# Patient Record
Sex: Female | Born: 1955 | Race: Black or African American | Hispanic: No | Marital: Married
Health system: Midwestern US, Community
[De-identification: ages and names within clinical notes are randomized; demographics above are authoritative.]

## PROBLEM LIST (undated history)

## (undated) DIAGNOSIS — R200 Anesthesia of skin: Secondary | ICD-10-CM

## (undated) DIAGNOSIS — E049 Nontoxic goiter, unspecified: Secondary | ICD-10-CM

## (undated) DIAGNOSIS — I739 Peripheral vascular disease, unspecified: Secondary | ICD-10-CM

## (undated) DIAGNOSIS — J189 Pneumonia, unspecified organism: Secondary | ICD-10-CM

## (undated) DIAGNOSIS — N2581 Secondary hyperparathyroidism of renal origin: Secondary | ICD-10-CM

## (undated) DIAGNOSIS — D510 Vitamin B12 deficiency anemia due to intrinsic factor deficiency: Secondary | ICD-10-CM

## (undated) DIAGNOSIS — K5900 Constipation, unspecified: Secondary | ICD-10-CM

## (undated) DIAGNOSIS — E039 Hypothyroidism, unspecified: Secondary | ICD-10-CM

## (undated) DIAGNOSIS — Z5189 Encounter for other specified aftercare: Secondary | ICD-10-CM

## (undated) DIAGNOSIS — J45909 Unspecified asthma, uncomplicated: Secondary | ICD-10-CM

## (undated) DIAGNOSIS — T4145XA Adverse effect of unspecified anesthetic, initial encounter: Secondary | ICD-10-CM

## (undated) DIAGNOSIS — I509 Heart failure, unspecified: Secondary | ICD-10-CM

## (undated) DIAGNOSIS — E1142 Type 2 diabetes mellitus with diabetic polyneuropathy: Secondary | ICD-10-CM

## (undated) DIAGNOSIS — R002 Palpitations: Secondary | ICD-10-CM

## (undated) DIAGNOSIS — E785 Hyperlipidemia, unspecified: Secondary | ICD-10-CM

## (undated) DIAGNOSIS — Z923 Personal history of irradiation: Secondary | ICD-10-CM

## (undated) DIAGNOSIS — E119 Type 2 diabetes mellitus without complications: Secondary | ICD-10-CM

## (undated) DIAGNOSIS — E042 Nontoxic multinodular goiter: Secondary | ICD-10-CM

## (undated) DIAGNOSIS — Z9221 Personal history of antineoplastic chemotherapy: Secondary | ICD-10-CM

## (undated) DIAGNOSIS — D649 Anemia, unspecified: Secondary | ICD-10-CM

## (undated) DIAGNOSIS — R519 Headache, unspecified: Secondary | ICD-10-CM

## (undated) DIAGNOSIS — K219 Gastro-esophageal reflux disease without esophagitis: Secondary | ICD-10-CM

## (undated) DIAGNOSIS — I10 Essential (primary) hypertension: Secondary | ICD-10-CM

## (undated) DIAGNOSIS — M1990 Unspecified osteoarthritis, unspecified site: Secondary | ICD-10-CM

## (undated) DIAGNOSIS — G709 Myoneural disorder, unspecified: Secondary | ICD-10-CM

## (undated) DIAGNOSIS — M1611 Unilateral primary osteoarthritis, right hip: Secondary | ICD-10-CM

## (undated) DIAGNOSIS — J40 Bronchitis, not specified as acute or chronic: Secondary | ICD-10-CM

## (undated) DIAGNOSIS — D509 Iron deficiency anemia, unspecified: Secondary | ICD-10-CM

## (undated) DIAGNOSIS — C50919 Malignant neoplasm of unspecified site of unspecified female breast: Secondary | ICD-10-CM

## (undated) DIAGNOSIS — N189 Chronic kidney disease, unspecified: Secondary | ICD-10-CM

## (undated) DIAGNOSIS — R011 Cardiac murmur, unspecified: Secondary | ICD-10-CM

## (undated) DIAGNOSIS — E079 Disorder of thyroid, unspecified: Secondary | ICD-10-CM

## (undated) DIAGNOSIS — R3915 Urgency of urination: Secondary | ICD-10-CM

## (undated) DIAGNOSIS — E041 Nontoxic single thyroid nodule: Secondary | ICD-10-CM

## (undated) DIAGNOSIS — G4730 Sleep apnea, unspecified: Secondary | ICD-10-CM

## (undated) DIAGNOSIS — R0600 Dyspnea, unspecified: Secondary | ICD-10-CM

## (undated) HISTORY — DX: Palpitations: R00.2

## (undated) HISTORY — DX: Unilateral primary osteoarthritis, right hip: M16.11

## (undated) HISTORY — DX: Unspecified asthma, uncomplicated: J45.909

## (undated) HISTORY — PX: MASTECTOMY: SHX3

## (undated) HISTORY — DX: Encounter for other specified aftercare: Z51.89

## (undated) HISTORY — DX: Hypothyroidism, unspecified: E03.9

## (undated) HISTORY — DX: Malignant neoplasm of unspecified site of unspecified female breast: C50.919

## (undated) HISTORY — DX: Disorder of thyroid, unspecified: E07.9

## (undated) HISTORY — DX: Bronchitis, not specified as acute or chronic: J40

## (undated) HISTORY — DX: Vitamin B12 deficiency anemia due to intrinsic factor deficiency: D51.0

## (undated) HISTORY — PX: DIAGNOSTIC LAPAROSCOPY: SUR761

## (undated) HISTORY — PX: KNEE ARTHROSCOPY: SUR90

## (undated) HISTORY — DX: Nontoxic single thyroid nodule: E04.1

## (undated) HISTORY — DX: Urgency of urination: R39.15

## (undated) HISTORY — DX: Chronic kidney disease, unspecified: N18.9

## (undated) HISTORY — DX: Pneumonia, unspecified organism: J18.9

## (undated) HISTORY — DX: Adverse effect of unspecified anesthetic, initial encounter: T41.45XA

## (undated) HISTORY — DX: Constipation, unspecified: K59.00

## (undated) HISTORY — DX: Type 2 diabetes mellitus without complications: E11.9

## (undated) HISTORY — PX: ABDOMINAL SURGERY: SHX537

## (undated) HISTORY — DX: Hyperlipidemia, unspecified: E78.5

---

## 1980-11-24 HISTORY — PX: CHOLECYSTECTOMY: SHX55

## 1989-11-24 DIAGNOSIS — I10 Essential (primary) hypertension: Secondary | ICD-10-CM

## 1989-11-24 HISTORY — DX: Essential (primary) hypertension: I10

## 2006-11-24 HISTORY — PX: CARPAL TUNNEL RELEASE: SHX101

## 2008-11-24 DIAGNOSIS — E119 Type 2 diabetes mellitus without complications: Secondary | ICD-10-CM

## 2008-11-24 HISTORY — DX: Type 2 diabetes mellitus without complications: E11.9

## 2009-11-24 HISTORY — PX: ABDOMINAL HYSTERECTOMY: SHX81

## 2010-01-08 DIAGNOSIS — I10 Essential (primary) hypertension: Secondary | ICD-10-CM | POA: Insufficient documentation

## 2011-04-29 ENCOUNTER — Encounter

## 2012-08-31 ENCOUNTER — Encounter

## 2012-10-10 LAB — EKG, 12 LEAD, INITIAL
Atrial Rate: 65 {beats}/min
Calculated P Axis: 63 degrees
Calculated R Axis: 38 degrees
Calculated T Axis: 11 degrees
Diagnosis: NORMAL
P-R Interval: 180 ms
Q-T Interval: 414 ms
QRS Duration: 82 ms
QTC Calculation (Bezet): 430 ms
Ventricular Rate: 65 {beats}/min

## 2012-10-10 MED ADMIN — azithromycin (ZITHROMAX) tablet 500 mg: ORAL | @ 10:00:00 | NDC 68084027811

## 2012-10-10 MED FILL — AZITHROMYCIN 250 MG TAB: 250 mg | ORAL | Qty: 2

## 2012-10-10 NOTE — ED Provider Notes (Signed)
HPI Comments: 3:50 AM   56 y.o. female presents to the ED C/O productive cough of clear sputum x1 week. Pt woke up about 2 hours ago, in the middle of the night, and it scared her because she felt she couldn't breath because of her cough. She also confirms chest heaviness tonight. Pt states she thinks she has bronchitis, she went to [facility] 1 week ago and was prescribed Tessalon Pearls and Guaifenesin. Pt denies fever, nausea, vomiting, swelling in legs, and any other Sx or complaints.  Her medical hx includes DM and HTN. She denies hx of HTN or CVA.     Patient is a 56 y.o. female presenting with cough. The history is provided by the patient. No language interpreter was used.   Cough  This is a new problem. The current episode started more than 1 week ago. The problem occurs constantly. The problem has been gradually worsening. The cough is non-productive (clear sputum). There has been no fever. Pertinent negatives include no chills, no ear pain, no headaches, no rhinorrhea, no sore throat, no myalgias, no wheezing, no nausea and no vomiting. She is not a smoker.   Recorded by Susette Racer, ED Scribe, as dictated by Hetty Blend, MD.     Past Medical History   Diagnosis Date   ??? Diabetes    ??? Hypertension         Past Surgical History   Procedure Laterality Date   ??? Hx hysterectomy     ??? Pr abdomen surgery proc unlisted     ??? Hx cholecystectomy           History reviewed. No pertinent family history.     History     Social History   ??? Marital Status: MARRIED     Spouse Name: N/A     Number of Children: N/A   ??? Years of Education: N/A     Occupational History   ??? Not on file.     Social History Main Topics   ??? Smoking status: Never Smoker    ??? Smokeless tobacco: Not on file   ??? Alcohol Use: No   ??? Drug Use: Not on file   ??? Sexually Active: Not on file     Other Topics Concern   ??? Not on file     Social History Narrative   ??? No narrative on file            ALLERGIES: Entex      Review of Systems    Constitutional: Negative for fever and chills.   HENT: Negative for ear pain, sore throat and rhinorrhea.    Respiratory: Positive for cough and chest tightness ("chest heaviness"). Negative for wheezing.    Cardiovascular: Negative for leg swelling.   Gastrointestinal: Negative for nausea and vomiting.   Musculoskeletal: Negative for myalgias.   Neurological: Negative for dizziness and headaches.   All other systems reviewed and are negative.    Recorded by Susette Racer, ED Scribe, as dictated by Hetty Blend, MD.     Filed Vitals:    10/10/12 0336   BP: 167/82   Pulse: 68   Temp: 98.5 ??F (36.9 ??C)   Resp: 18   Height: 5\' 2"  (1.575 m)   Weight: 103.42 kg (228 lb)   SpO2: 97%            Physical Exam     Vital signs and nursing notes reviewed    CONSTITUTIONAL: Alert, middle aged, in no  apparent distress; well-developed; well-nourished. +Overweight.  HEAD:  Normocephalic, atraumatic  EYES: PERRL; EOM's intact.  ENTM: Nose: no rhinorrhea; Throat: no erythema or exudate, mucous membranes moist  Neck:  No JVD, supple without lymphadenopathy  RESP: Equal breath sounds. +Scatter Rhonchi.   CV: S1 and S2 WNL; No murmurs, gallops or rubs.  GI: Normal bowel sounds, abdomen soft and non-tender. No masses or organomegaly.  GU: No costo-vertebral angle tenderness.  BACK:  Non-tender  UPPER EXT:  Normal inspection  LOWER EXT: No edema, no calf tenderness.  Distal pulses intact.  NEURO: CN intact, reflexes 2/4 and sym, strength 5/5 and sym, sensation intact.  SKIN: No rashes; Normal for age and stage.  PSYCH:  Alert and oriented, normal affect.     MDM     Differential Diagnosis; Clinical Impression; Plan:     DDx: bronchitis vs PNA   Amount and/or Complexity of Data Reviewed:   Clinical lab tests:  Reviewed and ordered  Tests in the radiology section of CPT??:  Reviewed and ordered (CXR)  Tests in the medicine section of the CPT??:  Ordered and reviewed (EKG)  Discussion of test results with the performing providers:  No    Decide to obtain previous medical records or to obtain history from someone other than the patient:  No   Obtain history from someone other than the patient:  No   Review and summarize past medical records:  Yes   Discuss the patient with another provider:  No   Independant visualization of image, tracing, or specimen:  Yes (CXR, EKG)      Procedures       RESULTS    EKG READING:   EKG read by Hetty Blend, MD at 3:58AM  Vent rate 65 bpm, normal sinus rhythm, normal EKG  Recorded by Susette Racer, ED Scribe, as dictated by Hetty Blend, MD.     RADIOLOGY RESULT:  A Chest X-Ray was ordered. My reading of this film is perihilar infiltrate. (Pending review by Radiologist.)   Recorded by Susette Racer, ED Scribe, as dictated by Hetty Blend, MD.       EKG, 12 LEAD, INITIAL    (Results Pending)   XR CHEST PA LAT    (Results Pending)       Labs Reviewed - No data to display    No results found for this or any previous visit (from the past 12 hour(s)).      4:36 AM  Stana Bunting Munguia's  results have been reviewed with her.  She has been counseled regarding her diagnosis, treatment, and plan.  She verbally conveys understanding and agreement of the signs, symptoms, diagnosis, treatment and prognosis and additionally agrees to follow up as discussed.  She also agrees with the care-plan and conveys that all of her questions have been answered.  I have also provided discharge instructions for her that include: educational information regarding their diagnosis and treatment, and list of reasons why they would want to return to the ED prior to their follow-up appointment, should her condition change.     CLINICAL IMPRESSION    1. Early PNA (pneumonia)        Recorded by Susette Racer, ED Scribe, as dictated by Hetty Blend, MD.

## 2012-10-10 NOTE — ED Notes (Signed)
Patient states " I have bronchitis".

## 2012-10-10 NOTE — ED Notes (Signed)
I have reviewed discharge instructions with the patient.  The patient verbalized understanding.Patient armband removed and shredded

## 2012-10-18 ENCOUNTER — Encounter

## 2012-10-19 LAB — D-DIMER, QUANTITATIVE: D-Dimer, Quant: 0.35 ug/ml(FEU) (ref <0.46)

## 2012-10-19 LAB — METABOLIC PANEL, COMPREHENSIVE
A-G Ratio: 0.9 (ref 0.8–1.7)
ALT (SGPT): 30 U/L (ref 12.0–78.0)
AST (SGOT): 17 U/L (ref 15–37)
Albumin: 3.4 g/dL (ref 3.4–5.0)
Alk. phosphatase: 126 U/L (ref 50–136)
Anion gap: 6 mmol/L (ref 3.0–18)
BUN/Creatinine ratio: 13 (ref 12–20)
BUN: 12 MG/DL (ref 7.0–18)
Bilirubin, total: 0.4 MG/DL (ref 0.2–1.0)
CO2: 34 MMOL/L — ABNORMAL HIGH (ref 21–32)
Calcium: 9.1 MG/DL (ref 8.5–10.1)
Chloride: 104 MMOL/L (ref 100–108)
Creatinine: 0.93 MG/DL (ref 0.6–1.3)
GFR est AA: >60 mL/min/{1.73_m2} (ref >60)
GFR est non-AA: >60 mL/min/{1.73_m2} (ref >60)
Globulin: 4 g/dL (ref 2.0–4.0)
Glucose: 104 MG/DL — ABNORMAL HIGH (ref 74–99)
Potassium: 3.7 MMOL/L (ref 3.5–5.5)
Protein, total: 7.4 g/dL (ref 6.4–8.2)
Sodium: 144 MMOL/L (ref 136–145)

## 2012-10-19 LAB — CARDIAC PANEL,(CK, CKMB & TROPONIN)
CK - MB: 1.7 ng/ml (ref 0.5–3.6)
CK-MB Index: 1 % (ref 0.0–4.0)
CK: 162 U/L (ref 26–192)
Troponin-I, QT: <0.02 NG/ML (ref 0.00–0.06)

## 2012-10-19 LAB — EKG, 12 LEAD, INITIAL
Atrial Rate: 65 {beats}/min
Calculated P Axis: 32 degrees
Calculated R Axis: 16 degrees
Calculated T Axis: -3 degrees
Diagnosis: NORMAL
P-R Interval: 174 ms
Q-T Interval: 406 ms
QRS Duration: 84 ms
QTC Calculation (Bezet): 422 ms
Ventricular Rate: 65 {beats}/min

## 2012-10-19 LAB — CBC WITH AUTOMATED DIFF
ABS. BASOPHILS: 0.1 10*3/uL — ABNORMAL HIGH (ref 0.0–0.06)
ABS. EOSINOPHILS: 0.1 10*3/uL (ref 0.0–0.4)
ABS. LYMPHOCYTES: 2.7 10*3/uL (ref 0.9–3.6)
ABS. MONOCYTES: 0.6 10*3/uL (ref 0.05–1.2)
ABS. NEUTROPHILS: 2.7 10*3/uL (ref 1.8–8.0)
BASOPHILS: 1 % (ref 0–2)
EOSINOPHILS: 2 % (ref 0–5)
HCT: 39 % (ref 35.0–45.0)
HGB: 12 g/dL (ref 12.0–16.0)
LYMPHOCYTES: 44 % (ref 21–52)
MCH: 24.4 PG (ref 24.0–34.0)
MCHC: 30.8 g/dL — ABNORMAL LOW (ref 31.0–37.0)
MCV: 79.4 FL (ref 74.0–97.0)
MONOCYTES: 9 % (ref 3–10)
MPV: 12.2 FL — ABNORMAL HIGH (ref 9.2–11.8)
NEUTROPHILS: 44 % (ref 40–73)
PLATELET: 201 10*3/uL (ref 135–420)
RBC: 4.91 M/uL (ref 4.20–5.30)
RDW: 16.6 % — ABNORMAL HIGH (ref 11.6–14.5)
WBC: 6.1 10*3/uL (ref 4.6–13.2)

## 2012-10-19 LAB — D DIMER: D DIMER: 0.35 ug/ml(FEU) (ref <0.46)

## 2012-10-19 MED ADMIN — ioversol (OPTIRAY) 350 mg iodine/mL contrast injection 125 mL: INTRAVENOUS | @ 12:00:00 | NDC 00019133381

## 2012-10-19 MED FILL — OPTIRAY 350 MG IODINE/ML INTRAVENOUS SYRINGE: 350 mg iodine/mL | INTRAVENOUS | Qty: 125

## 2012-10-19 NOTE — ED Notes (Signed)
Per patient seen in [facility] ED 10/10/2012 and DC'd with DX of PNA, continues to have cough, states that she has chest congestion and feel like the mucus is making it difficult to breath and is gagging her.

## 2012-10-19 NOTE — ED Notes (Signed)
CT operator called the ER to advise is should not be much longer for Bed 10 to come down for the CT as he is finishing up some work.

## 2012-10-19 NOTE — ED Notes (Signed)
I have reviewed discharge instructions with the patient.  The patient verbalized understanding.

## 2012-10-19 NOTE — ED Notes (Signed)
Also C/O right ear discomfort

## 2012-10-19 NOTE — ED Notes (Signed)
Patient armband removed and shredded

## 2012-10-19 NOTE — ED Provider Notes (Signed)
HPI Comments: 2:27 AM   56 y.o. female presents to ED C/O productive cough (clear, foamy), onset about 2 weeks ago. Associated sx's include chest congestion, chest tightness, postnasal drainage and voice hoarseness. Pt also reports tingling in the right side of her face. Pt states when she coughs it feels like her throat is closing up. Pt also notes a lot of mucus in her throat which she describes as like acid reflux. Pt also states 3 days ago she developed weakness in her legs and upper body. Pt was seen here 10/10/2012 and Dx'd with PNA. Pt states she saw her PCP yesterday and had XR's yesterday afternoon (4:30 PM). Pt denies leg swelling, nausea, vomiting and any other sx's or complaints.    Patient is a 56 y.o. female presenting with cough. The history is provided by the patient. No language interpreter was used.   Cough  The current episode started more than 1 week ago (2 weeks ago). The problem occurs constantly. The problem has been gradually worsening. The cough is productive of sputum. There has been no fever. Pertinent negatives include no chest pain, no headaches, no sore throat, no shortness of breath, no nausea and no vomiting. Her past medical history is significant for pneumonia.   Written by Broadus John, ED Scribe, as dictated by Hetty Blend, MD.      Past Medical History   Diagnosis Date   ??? Diabetes    ??? Hypertension         Past Surgical History   Procedure Laterality Date   ??? Hx hysterectomy     ??? Pr abdomen surgery proc unlisted     ??? Hx cholecystectomy           No family history on file.     History     Social History   ??? Marital Status: MARRIED     Spouse Name: N/A     Number of Children: N/A   ??? Years of Education: N/A     Occupational History   ??? Not on file.     Social History Main Topics   ??? Smoking status: Never Smoker    ??? Smokeless tobacco: Not on file   ??? Alcohol Use: No   ??? Drug Use: Not on file   ??? Sexually Active: Not on file     Other Topics Concern   ??? Not on file      Social History Narrative   ??? No narrative on file            ALLERGIES: Entex      Review of Systems   Constitutional: Negative for fever, activity change, appetite change and unexpected weight change.   HENT: Positive for voice change (hoarse) and postnasal drip. Negative for congestion and sore throat.    Respiratory: Positive for cough and chest tightness. Negative for shortness of breath.         Chest congestion   Cardiovascular: Negative for chest pain and leg swelling.   Gastrointestinal: Negative for nausea, vomiting, abdominal pain and diarrhea.   Genitourinary: Negative for difficulty urinating.   Musculoskeletal: Negative for back pain.   Skin: Negative for rash.   Neurological: Positive for weakness. Negative for headaches.   All other systems reviewed and are negative.        Filed Vitals:    10/19/12 0207 10/19/12 0602   BP: 166/88 141/66   Pulse: 65 66   Temp: 98.6 ??F (37 ??C)    Resp:  18 16   Height: 5\' 2"  (1.575 m)    Weight: 101.606 kg (224 lb)    SpO2: 96% 100%            Physical Exam     Vital signs and nursing notes reviewed    CONSTITUTIONAL: Alert, in no apparent distress; well-developed; well-nourished. Slightly overweight, anxious, middle-aged female  HEAD:  Normocephalic, atraumatic  EYES: PERRL; EOM's intact.  ENTM: Nose: no rhinorrhea; Throat: no erythema or exudate, mucous membranes moist, some postnasal drip; Ears: TM's retracted bilaterally   Neck:  No JVD, supple without lymphadenopathy  RESP: Chest clear, equal breath sounds.  CV: S1 and S2 WNL; No murmurs, gallops or rubs.  GI: Normal bowel sounds, abdomen soft and non-tender. No masses or organomegaly.  GU: No costo-vertebral angle tenderness.  BACK:  Non-tender  UPPER EXT:  Normal inspection  LOWER EXT: No edema, no calf tenderness.  Distal pulses intact.  NEURO: CN intact, reflexes 2/4 and sym, strength 5/5 and sym, sensation intact.  SKIN: No rashes; Normal for age and stage.  PSYCH:  Alert and oriented, normal affect.     MDM      Differential Diagnosis; Clinical Impression; Plan:     DDx include: PNA, PE, CHF   Amount and/or Complexity of Data Reviewed:   Clinical lab tests:  Ordered and reviewed  Tests in the radiology section of CPT??:  Ordered and reviewed (CTA Chest)  Tests in the medicine section of the CPT??:  Ordered and reviewed (EKG)   Obtain history from someone other than the patient:  No   Review and summarize past medical records:  Yes   Independant visualization of image, tracing, or specimen:  Yes (EKG)      Procedures    Medications   ioversol (OPTIRAY) 350 mg iodine/mL contrast injection 125 mL (125 mL IntraVENous Given 10/19/12 0641)        PROGRESS NOTE:   2:30 AM  Answered pt's questions regarding treatment.  Written by Broadus John, ED Scribe, as dictated by Hetty Blend, MD.     PROGRESS NOTE:   7:11 AM  Pt has been re-examined by Kenn File, MD. Pt is feeling better, no pain at this time.   Written by Broadus John, ED Scribe, as dictated by Hetty Blend, MD.      RESULTS    EKG, 12 LEAD, INITIAL    Final Result:        CTA CHEST W WO CONT    (Results Pending)        Labs Reviewed   CBC WITH AUTOMATED DIFF - Abnormal; Notable for the following:     MCHC 30.8 (*)     RDW 16.6 (*)     MPV 12.2 (*)     ABS. BASOPHILS 0.1 (*)     All other components within normal limits   METABOLIC PANEL, COMPREHENSIVE - Abnormal; Notable for the following:     CO2 34 (*)     Glucose 104 (*)     All other components within normal limits   CARDIAC PANEL,(CK, CKMB & TROPONIN)   D DIMER       Recent Results (from the past 12 hour(s))   EKG, 12 LEAD, INITIAL    Collection Time    10/19/12  2:41 AM       Result Value Range    Ventricular Rate 65      Atrial Rate 65      P-R Interval 174  QRS Duration 84      Q-T Interval 406      QTC Calculation (Bezet) 422      Calculated P Axis 32      Calculated R Axis 16      Calculated T Axis -3      Diagnosis        Value: Normal sinus rhythm      Minimal voltage  criteria for LVH, may be normal variant      Nonspecific ST and T wave abnormality      When compared with ECG of 10-Oct-2012 03:58,      No significant change was found      Confirmed by [id number], Daune Perch A ([id number]) on 10/19/2012 6:33:32 AM   CBC WITH AUTOMATED DIFF    Collection Time    10/19/12  2:44 AM       Result Value Range    WBC 6.1  4.6 - 13.2 K/uL    RBC 4.91  4.20 - 5.30 M/uL    HGB 12.0  12.0 - 16.0 g/dL    HCT 28.4  13.2 - 44.0 %    MCV 79.4  74.0 - 97.0 FL    MCH 24.4  24.0 - 34.0 PG    MCHC 30.8 (*) 31.0 - 37.0 g/dL    RDW 10.2 (*) 72.5 - 14.5 %    PLATELET 201  135 - 420 K/uL    MPV 12.2 (*) 9.2 - 11.8 FL    NEUTROPHILS 44  40 - 73 %    LYMPHOCYTES 44  21 - 52 %    MONOCYTES 9  3 - 10 %    EOSINOPHILS 2  0 - 5 %    BASOPHILS 1  0 - 2 %    ABS. NEUTROPHILS 2.7  1.8 - 8.0 K/UL    ABS. LYMPHOCYTES 2.7  0.9 - 3.6 K/UL    ABS. MONOCYTES 0.6  0.05 - 1.2 K/UL    ABS. EOSINOPHILS 0.1  0.0 - 0.4 K/UL    ABS. BASOPHILS 0.1 (*) 0.0 - 0.06 K/UL    DF AUTOMATED     METABOLIC PANEL, COMPREHENSIVE    Collection Time    10/19/12  2:44 AM       Result Value Range    Sodium 144  136 - 145 MMOL/L    Potassium 3.7  3.5 - 5.5 MMOL/L    Chloride 104  100 - 108 MMOL/L    CO2 34 (*) 21 - 32 MMOL/L    Anion gap 6  3.0 - 18 mmol/L    Glucose 104 (*) 74 - 99 MG/DL    BUN 12  7.0 - 18 MG/DL    Creatinine 3.66  0.6 - 1.3 MG/DL    BUN/Creatinine ratio 13  12 - 20      GFR est AA >60  >60 ml/min/1.66m2    GFR est non-AA >60  >60 ml/min/1.59m2    Calcium 9.1  8.5 - 10.1 MG/DL    Bilirubin, total 0.4  0.2 - 1.0 MG/DL    ALT 30  44.0 - 34.7 U/L    AST 17  15 - 37 U/L    Alk. phosphatase 126  50 - 136 U/L    Protein, total 7.4  6.4 - 8.2 g/dL    Albumin 3.4  3.4 - 5.0 g/dL    Globulin 4.0  2.0 - 4.0 g/dL    A-G Ratio 0.9  0.8 - 1.7  CARDIAC PANEL,(CK, CKMB & TROPONIN)    Collection Time    10/19/12  2:44 AM       Result Value Range    CK 162  26 - 192 U/L    CK - MB 1.7  0.5 - 3.6 ng/ml    CK-MB Index 1.0  0.0 - 4.0 %     Troponin-I, Qt. <0.02  0.00 - 0.06 NG/ML   D DIMER    Collection Time    10/19/12  2:44 AM       Result Value Range    D DIMER 0.35  <0.46 ug/ml(FEU)       7:12 AM  Talbert Forest E Morozov's  results have been reviewed with her.  She has been counseled regarding her diagnosis, treatment, and plan.  She verbally conveys understanding and agreement of the signs, symptoms, diagnosis, treatment and prognosis and additionally agrees to follow up as discussed.  She also agrees with the care-plan and conveys that all of her questions have been answered.  I have also provided discharge instructions for her that include: educational information regarding their diagnosis and treatment, and list of reasons why they would want to return to the ED prior to their follow-up appointment, should her condition change.    CLINICAL IMPRESSION    1. Cough    2. Laryngospasm    3. Pericardial effusion        Written by Broadus John, ED Scribe, as dictated by Hetty Blend, MD.

## 2012-10-19 NOTE — ED Notes (Signed)
Assumed care of the patient, received report from K. Pernell Dupre, RN

## 2012-10-26 NOTE — Progress Notes (Signed)
Chief Complaint   [name] presents with   ??? New [name]     pt. was in hosp. nov. 26    ??? Palpitations     off on some of the time     HPI: Pt is a 56 YO BF with a history of DM, HTN, high cholesterol and a family history of CAD who has had recent bouts of bronchitis and PNA recently.  The ER asked for her to be evaluated by [place] due to the diagnosis of cardiomegaly on her CXR in the ER.  Pt has no history of any heart issues herself.  She has had some chest pain which she describes as a burning and heaviness in her mid central chest with radiation to her neck.  It is worse with laying down.  It does not occur with exertion.  It is not associated with any other symptoms.  She was started on Nexium and this seems to be helping this a lot.  Her breathing is improving.  She still has a cough and it is hard to cough up mucus but when she does she feels better.  She denies any DOE or CHF symptoms.  Since she has been sick she has noticed the sensation of heavy beats and flipping of her heart.  It has not been racing.  This usually occurs at rest.  She has no other symptoms associated with the irregular heart beat.  It can occur once or several in a row.  The flipping usually comes first then the hard beat.  She denies any lightheadedness or syncope.  She has had occasional vertigo in the past but not now.    Past Medical History   Diagnosis Date   ??? Diabetes    ??? Hypertension    ??? Palpitations    ??? Hyperlipidemia    ??? Thyroid disease      nodules     Past Surgical History   Procedure Laterality Date   ??? Hx hysterectomy     ??? Pr abdomen surgery proc unlisted     ??? Hx cholecystectomy       Current Outpatient Prescriptions   Medication Sig   ??? DEXTROSE (GLUCOSE PO) Take  by mouth two (2) times a day.   ??? esomeprazole (NEXIUM) 40 mg capsule Take  by mouth daily.   ??? metoprolol (LOPRESSOR) 50 mg tablet Take 50 mg by mouth daily.   ??? OMEGA-3S/DHA/EPA/FISH OIL (OMEGA 3 PO) Take  by mouth.   ??? multivitamin (MULTIPLE VITAMIN  ESSENTIAL) tablet Take 1 Tab by mouth daily.   ??? garlic cap Take  by mouth.   ??? mometasone (NASONEX) 50 mcg/actuation nasal spray 2 Sprays daily.   ??? NIFEdipine ER (ADALAT CC) 30 mg ER tablet Take 30 mg by mouth daily.   ??? losartan (COZAAR) 100 mg tablet Take 100 mg by mouth daily.   ??? metFORMIN (GLUCOPHAGE) 500 mg tablet Take 500 mg by mouth two (2) times daily (with meals).   ??? cholecalciferol (VITAMIN D3) 1,000 unit tablet Take 4,000 Units by mouth daily.   ??? B.infantis-B.ani-B.long-B.bifi (PROBIOTIC 4X) 10-15 mg TbEC Take  by mouth.   ??? ascorbic acid (VITAMIN C) powd Take  by mouth.   ??? OXYBUTYNIN CHLORIDE (DITROPAN PO) Take  by mouth.   ??? hydrochlorothiazide (HYDRODIURIL) 25 mg tablet Take 1 Tab by mouth daily.   ??? simvastatin (ZOCOR) 20 mg tablet Take 1 Tab by mouth nightly.   ??? Aspirin, Buffered (BUFFERIN) 81 mg tab  Take  by mouth.     No current facility-administered medications for this visit.     Allergies and Intolerances:   Allergies   Allergen Reactions   ??? Entex (Phenylephrine-Guaifenesin) Rash     Family History:   Family History   Problem Relation Age of Onset   ??? Arrhythmia Mother    ??? Heart Attack Mother    ??? Coronary Artery Disease Maternal Uncle    ??? Heart Attack Maternal Uncle        Social History:   She  reports that she has never smoked. She has never used smokeless tobacco.  She  reports that she does not drink alcohol.      Review of Systems:   As above including,  Otherwise 11 point review of systems negative including constitutional, skin, HENT, eyes, respiratory, cardiovascular, gastrointestinal, genitourinary, musculoskeletal, endo/heme/aller, neurological.      Physical:   Vitals:   BP: 134/71  HR: 69  Wt: 228 lb 6.4 oz (103.602 kg)    Exam: Physical Examination: General appearance - alert, well appearing, and in no distress, oriented to person, place, and time and overweight  Mental status - alert, oriented to person, place, and time, affect appropriate to mood, anxious  Eyes - pupils  equal and reactive, extraocular eye movements intact  Mouth - mucous membranes moist, pharynx normal without lesions  Neck - supple, no significant adenopathy, carotids upstroke normal bilaterally, no bruits  Chest - clear to auscultation, no wheezes, rales or rhonchi, symmetric air entry  Heart - normal rate and regular rhythm, S1 and S2 normal, S4 present, systolic murmur 1/6 SEM RUSB  Abdomen - soft, nontender, nondistended, no masses or organomegaly  Rectal - rectal exam not indicated  Neurological - alert, oriented, normal speech, no focal findings or movement disorder noted, motor and sensory grossly normal bilaterally, normal muscle tone, no tremors, strength 5/5  Musculoskeletal - no joint tenderness, deformity or swelling  Extremities - peripheral pulses normal, no pedal edema, no clubbing or cyanosis  Skin - normal coloration and turgor, no rashes, no suspicious skin lesions noted    Review of Data:   LABS:   Lab Results   Component Value Date/Time    WBC 6.1 10/19/2012  2:44 AM    HGB 12.0 10/19/2012  2:44 AM    HCT 39.0 10/19/2012  2:44 AM    PLATELET 201 10/19/2012  2:44 AM     Lab Results   Component Value Date/Time    Sodium 144 10/19/2012  2:44 AM    Potassium 3.7 10/19/2012  2:44 AM    Chloride 104 10/19/2012  2:44 AM    CO2 34 10/19/2012  2:44 AM    Glucose 104 10/19/2012  2:44 AM    BUN 12 10/19/2012  2:44 AM    Creatinine 0.93 10/19/2012  2:44 AM     No results found for this basename: CHOL, CHOLX, CHOLP, CHLST, CHOLV, HDL, HDLC, HDLP, LDL, DLDL, LDLC, DLDLP, TGL, TGLX, TRIGL, TRIGP     Lab Results   Component Value Date/Time    ALT 30 10/19/2012  2:44 AM     No results found for this basename: HBA1C, HGBE8, HA1CLT, HBA1CPOC       EKG:  Sinus  Rhythm   -Nonspecific ST depression  -Nondiagnostic.     Impression / Plan:    [name] Active Problem List   Diagnosis Code   ??? Palpitations 785.1   ??? Chest pain, unspecified 786.50   ???  Esophageal reflux 530.81   ??? HTN (hypertension) 401.9   ??? DM (diabetes  mellitus) 250.00   ??? High cholesterol 272.0   ??? Family history of early CAD V17.3   ??? Obesity 278.00     Pt likely has PVC's or PAC's causing her palpitations as her description is fairly classic.  Her chest pain is atypical and likely GERD but she is very concerned given her family history.  She does have a lot of risk factors and thus will plan stress test and echo to evaluate for ischemia but also for cardiomegaly.  RTC after testing.        Orders Placed This Encounter   ??? AMB POC EKG ROUTINE W/ 12 LEADS, INTER & REP   ??? STRESS TEST PHARMACOLOGICAL   ??? NM CARDIAC SPECT W STRS / REST MULT   ??? 2D ECHO COMPLETE ADULT (TTE)   ??? DISCONTD: glucose, tolerance test, 100 gram/180 mL liqd   ??? DEXTROSE (GLUCOSE PO)   ??? esomeprazole (NEXIUM) 40 mg capsule

## 2012-11-01 MED ADMIN — technetium sestamibi (CARDIOLITE) injection 34.6 milli Curie: INTRAVENOUS | @ 16:00:00 | NDC 99999001301

## 2012-11-01 MED ADMIN — technetium sestamibi (CARDIOLITE) injection 10.5 milli Curie: INTRAVENOUS | @ 14:00:00 | NDC 99999001301

## 2012-11-01 MED FILL — TECHNETIUM TC 99M SESTAMIBI - CARDIOLITE: Qty: 35

## 2012-11-01 MED FILL — TECHNETIUM TC 99M SESTAMIBI - CARDIOLITE: Qty: 11

## 2012-11-02 NOTE — Telephone Encounter (Signed)
Message copied by Homero Fellers on Tue Nov 02, 2012  8:57 AM  ------       Message from: Daune Perch A       Created: Mon Nov 01, 2012  2:52 PM       Regarding: echo and nuclear         Nuclear normal.  Echo shows diastolic dysfunction.  RTC to discuss further.       ----- Message -----          From: Card Result In Edi          Sent: 11/01/2012   1:52 PM            To: Daune Perch, MD                       ------

## 2012-11-02 NOTE — Telephone Encounter (Signed)
Called to give pt echo results.  Left msg for patient to call the office if she would like, or she can get her results at her appt on  11/04/2013

## 2012-11-04 NOTE — Progress Notes (Signed)
Chief Complaint   Patient presents with   ??? Results     HPI: Pt is a 56 YO BF with a history of DM, HTN, high cholesterol and a family history of CAD who has had recent bouts of bronchitis and PNA recently. The ER asked for her to be evaluated by [place] due to the diagnosis of cardiomegaly on her CXR in the ER. Pt has no history of any heart issues herself. She has had some chest pain which she describes as a burning and heaviness in her mid central chest with radiation to her neck. It is worse with laying down. It does not occur with exertion. It is not associated with any other symptoms. She was started on Nexium and this seems to be helping this a lot. Her breathing is improving. She still has a cough and it is hard to cough up mucus but when she does she feels better. She denies any DOE or CHF symptoms. Since she has been sick she has noticed the sensation of heavy beats and flipping of her heart. It has not been racing. This usually occurs at rest. She has no other symptoms associated with the irregular heart beat. It can occur once or several in a row. The flipping usually comes first then the hard beat. She denies any lightheadedness or syncope. She has had occasional vertigo in the past but not now.  She did undergo echocardiography which showed normal LVF with moderate diastolic dysfunction.  Stress testing showed poor exercise tolerance with resting hypertension and hypertensive response to exercise but no ischemia or infarction and normal LVEF.HGBA1C was 6.8 and LDL cholesterol was 110.    Past Medical History   Diagnosis Date   ??? Diabetes    ??? Hypertension    ??? Palpitations    ??? Hyperlipidemia    ??? Thyroid disease      nodules   ??? Pneumonia    ??? Bronchitis      Past Surgical History   Procedure Laterality Date   ??? Hx hysterectomy     ??? Pr abdomen surgery proc unlisted     ??? Hx cholecystectomy       Current Outpatient Prescriptions   Medication Sig   ??? ascorbic acid (VITAMIN C) powd Take  by mouth.   ???  OXYBUTYNIN CHLORIDE (DITROPAN PO) Take  by mouth.   ??? hydrochlorothiazide (HYDRODIURIL) 25 mg tablet Take 1 Tab by mouth daily.   ??? simvastatin (ZOCOR) 20 mg tablet Take 1 Tab by mouth nightly.   ??? DEXTROSE (GLUCOSE PO) Take  by mouth two (2) times a day.   ??? esomeprazole (NEXIUM) 40 mg capsule Take  by mouth daily.   ??? metoprolol (LOPRESSOR) 50 mg tablet Take 50 mg by mouth daily.   ??? OMEGA-3S/DHA/EPA/FISH OIL (OMEGA 3 PO) Take  by mouth.   ??? multivitamin (MULTIPLE VITAMIN ESSENTIAL) tablet Take 1 Tab by mouth daily.   ??? garlic cap Take  by mouth.   ??? NIFEdipine ER (ADALAT CC) 30 mg ER tablet Take 30 mg by mouth daily.   ??? losartan (COZAAR) 100 mg tablet Take 100 mg by mouth daily.   ??? metFORMIN (GLUCOPHAGE) 500 mg tablet Take 500 mg by mouth two (2) times daily (with meals).   ??? cholecalciferol (VITAMIN D3) 1,000 unit tablet Take 4,000 Units by mouth daily.   ??? B.infantis-B.ani-B.long-B.bifi (PROBIOTIC 4X) 10-15 mg TbEC Take  by mouth.   ??? methylPREDNISolone (MEDROL, PAK,) 4 mg tablet Per dose pack  instructions   ??? albuterol (PROVENTIL HFA, VENTOLIN HFA) 90 mcg/actuation inhaler Take 2 Puffs by inhalation every four (4) hours as needed for Wheezing.   ??? mometasone (NASONEX) 50 mcg/actuation nasal spray 2 Sprays daily.   ??? Aspirin, Buffered (BUFFERIN) 81 mg tab Take  by mouth.     No current facility-administered medications for this visit.     Allergies and Intolerances:   Allergies   Allergen Reactions   ??? Entex (Phenylephrine-Guaifenesin) Rash     Family History:   Family History   Problem Relation Age of Onset   ??? Arrhythmia Mother    ??? Heart Attack Mother    ??? Coronary Artery Disease Maternal Uncle    ??? Heart Attack Maternal Uncle        Social History:   She  reports that she has never smoked. She has never used smokeless tobacco.  She  reports that she does not drink alcohol.      Review of Systems:   As above including,  Otherwise 11 point review of systems negative including constitutional, skin, HENT,  eyes, respiratory, cardiovascular, gastrointestinal, genitourinary, musculoskeletal, endo/heme/aller, neurological.      Physical:   Vitals:   BP: 156/71  HR: 65  Wt: 230 lb (104.327 kg)    Exam: Physical Examination: General appearance - alert, well appearing, and in no distress, oriented to person, place, and time and overweight  Mental status - alert, oriented to person, place, and time, normal mood, behavior, speech, dress, motor activity, and thought processes  Eyes - pupils equal and reactive, extraocular eye movements intact  Mouth - mucous membranes moist, pharynx normal without lesions  Neck - supple, no significant adenopathy, carotids upstroke normal bilaterally, no bruits  Chest - clear to auscultation, no wheezes, rales or rhonchi, symmetric air entry  Heart - normal rate and regular rhythm, S1 and S2 normal, no murmurs noted, S4 present  Abdomen - soft, nontender, nondistended, no masses or organomegaly  Rectal - rectal exam not indicated  Neurological - alert, oriented, normal speech, no focal findings or movement disorder noted, motor and sensory grossly normal bilaterally  Musculoskeletal - no joint tenderness, deformity or swelling  Extremities - pedal edema trace +, intact peripheral pulses  Skin - normal coloration and turgor, no rashes, no suspicious skin lesions noted    Review of Data:   LABS:   Lab Results   Component Value Date/Time    WBC 6.1 10/19/2012  2:44 AM    HGB 12.0 10/19/2012  2:44 AM    HCT 39.0 10/19/2012  2:44 AM    PLATELET 201 10/19/2012  2:44 AM     Lab Results   Component Value Date/Time    Sodium 144 10/19/2012  2:44 AM    Potassium 3.7 10/19/2012  2:44 AM    Chloride 104 10/19/2012  2:44 AM    CO2 34 10/19/2012  2:44 AM    Glucose 104 10/19/2012  2:44 AM    BUN 12 10/19/2012  2:44 AM    Creatinine 0.93 10/19/2012  2:44 AM     No results found for this basename: CHOL, CHOLX, CHOLP, CHLST, CHOLV, HDL, HDLC, HDLP, LDL, DLDL, LDLC, DLDLP, TGL, TGLX, TRIGL, TRIGP     No results  found for this basename: GPT     No results found for this basename: HBA1C, HGBE8, HA1CLT, HBA1CPOC     ECHO: Left ventricle: Systolic function was normal. Ejection fraction was  estimated in the range of 55 %  to 60 %. There were no regional wall motion  abnormalities. Wall thickness was at the upper limits of normal. Features  were consistent with a pseudonormal left ventricular filling pattern, with  concomitant abnormal relaxation and increased filling pressure (grade 2  diastolic dysfunction).    Left atrium: The atrium was mildly dilated.    Mitral valve: There was mild regurgitation.    Tricuspid valve: There was mild to moderate regurgitation. Pulmonary  artery systolic pressure was mildly to moderately increased.    Inferior vena cava, hepatic veins: The inferior vena cava was mildly  dilated. The respirophasic change in diameter was less than 50%.      STRESS TEST (EST, PHARM, NUC, ECHO etc): 1. No evidence of ischemia or infarction with gated ejection fraction of   60%. Uncorrected fixed anterior defect likely due to breast attenuation   artifact.   2. Resting hypertension with hypertensive response to exercise.  3. Poor exercise tolerance.      Impression / Plan:    Patient Active Problem List   Diagnosis Code   ??? Palpitations 785.1   ??? Chest pain, unspecified 786.50   ??? Esophageal reflux 530.81   ??? HTN (hypertension) 401.9   ??? DM (diabetes mellitus) 250.00   ??? High cholesterol 272.0   ??? Family history of early CAD V17.3   ??? Obesity 278.00     I suspect a lot of her symptoms are due to diastolic dysfunction.  Her BP is elevated and thus I have started HCTZ.  Given her DM and elevated LDL, will start low dose zocor.  Would recheck cholesterol in 3 months.  Discussed need for diet, weight loss and exercise as I suspect this is a lot of what is causing her symptoms as well.  She voiced understanding.  RTC 3 months.      Orders Placed This Encounter   ??? ascorbic acid (VITAMIN C) powd   ??? OXYBUTYNIN CHLORIDE  (DITROPAN PO)   ??? hydrochlorothiazide (HYDRODIURIL) 25 mg tablet   ??? simvastatin (ZOCOR) 20 mg tablet

## 2012-11-14 DIAGNOSIS — R079 Chest pain, unspecified: Secondary | ICD-10-CM | POA: Insufficient documentation

## 2012-11-18 MED ADMIN — albuterol-ipratropium (DUO-NEB) 2.5 MG-0.5 MG/3 ML: RESPIRATORY_TRACT | @ 18:00:00 | NDC 00487020101

## 2012-11-18 MED ADMIN — predniSONE (DELTASONE) tablet 60 mg: ORAL | @ 18:00:00 | NDC 00054001825

## 2012-11-18 MED FILL — PREDNISONE 20 MG TAB: 20 mg | ORAL | Qty: 3

## 2012-11-18 MED FILL — IPRATROPIUM-ALBUTEROL 2.5 MG-0.5 MG/3 ML NEB SOLUTION: 2.5 mg-0.5 mg/3 ml | RESPIRATORY_TRACT | Qty: 3

## 2012-11-18 NOTE — ED Notes (Signed)
Patient armband removed and shreddedI have reviewed discharge instructions with the patient.  The patient and spouse verbalized understanding. rx x 3 given;

## 2012-11-18 NOTE — ED Provider Notes (Signed)
HPI Comments: 11:54 AM  56 y.o. female presents to the ED C/O cough. Associated sxs include SOB during cough spasms in which "clear, stringy stuff" comes up. Pt has taken Nexium last night and this morning. Pt states she had bronchitis and PNA in November and last took antibiotics the 2nd week of November. Pt is not on albuterol. Pt denies fever, tobacco use, FHx of asthma, and any other sxs or complaints    Recorded by Annie Sable, ED Scribe, as dictated by Bailey Mech, MD     Patient is a 56 y.o. female presenting with cough. The history is provided by the patient. No language interpreter was used.   Cough  This is a recurrent problem. The problem occurs constantly. The problem has not changed since onset.There has been no fever. Associated symptoms include shortness of breath. Pertinent negatives include no chest pain, no headaches, no sore throat, no nausea and no vomiting. She is not a smoker. Her past medical history is significant for bronchitis and pneumonia. Her past medical history does not include asthma.        Past Medical History   Diagnosis Date   ??? Diabetes    ??? Hypertension    ??? Palpitations    ??? Hyperlipidemia    ??? Thyroid disease      nodules   ??? Pneumonia    ??? Bronchitis         Past Surgical History   Procedure Laterality Date   ??? Hx hysterectomy     ??? Pr abdomen surgery proc unlisted     ??? Hx cholecystectomy           Family History   Problem Relation Age of Onset   ??? Arrhythmia Mother    ??? Heart Attack Mother    ??? Coronary Artery Disease Maternal Uncle    ??? Heart Attack Maternal Uncle         History     Social History   ??? Marital Status: MARRIED     Spouse Name: N/A     Number of Children: N/A   ??? Years of Education: N/A     Occupational History   ??? Not on file.     Social History Main Topics   ??? Smoking status: Never Smoker    ??? Smokeless tobacco: Never Used   ??? Alcohol Use: No   ??? Drug Use: Not on file   ??? Sexually Active: Not on file     Other Topics Concern   ??? Not on file      Social History Narrative   ??? No narrative on file                  ALLERGIES: Entex      Review of Systems   Constitutional: Negative for fever, activity change, appetite change and unexpected weight change.   HENT: Negative for congestion and sore throat.    Respiratory: Positive for cough and shortness of breath.    Cardiovascular: Negative for chest pain.   Gastrointestinal: Negative for nausea, vomiting, abdominal pain and diarrhea.   Genitourinary: Negative for difficulty urinating.   Musculoskeletal: Negative for back pain.   Skin: Negative for rash.   Neurological: Negative for headaches.   All other systems reviewed and are negative.        Filed Vitals:    11/18/12 1116 11/18/12 1149 11/18/12 1152   BP: 160/80  175/84   Pulse: 72  69   Temp: 98.1 ??  F (36.7 ??C)     Resp: 18  20   Height: 5\' 2"  (1.575 m)     Weight: 102.059 kg (225 lb)     SpO2: 96% 99% 99%            Physical Exam   Nursing note and vitals reviewed.  Constitutional: She is oriented to person, place, and time. She appears well-developed and well-nourished.   HENT:   Head: Normocephalic and atraumatic.   Right Ear: External ear normal.   Left Ear: External ear normal.   Nose: Nose normal.   Mouth/Throat: Oropharynx is clear and moist.   Eyes: Conjunctivae and EOM are normal. Pupils are equal, round, and reactive to light.   Neck: Normal range of motion. Neck supple. No JVD present. No tracheal deviation present.   Cardiovascular: Normal rate, regular rhythm, normal heart sounds and intact distal pulses.  Exam reveals no gallop and no friction rub.    No murmur heard.  Pulmonary/Chest: Effort normal. No respiratory distress. She has wheezes (few on the R). She has no rales.   Abdominal: Soft. Bowel sounds are normal. She exhibits no distension and no mass. There is no tenderness. There is no rebound and no guarding.   Musculoskeletal: Normal range of motion. She exhibits no edema and no tenderness.   Neurological: She is alert and oriented to  person, place, and time. She has normal reflexes. No cranial nerve deficit.   Skin: Skin is warm and dry. No rash noted.   Psychiatric: She has a normal mood and affect. Her behavior is normal.        MDM     Differential Diagnosis; Clinical Impression; Plan:     Pneumonia, bronchospasm, allergies    STRESS TEST 2 weeks ago normal  Amount and/or Complexity of Data Reviewed:   Tests in the radiology section of CPT??:  Ordered and reviewed (CXR)  Tests in the medicine section of the CPT??:  Ordered and reviewed  Discussion of test results with the performing providers:  No   Decide to obtain previous medical records or to obtain history from someone other than the patient:  No   Obtain history from someone other than the patient:  No   Review and summarize past medical records:  No   Discuss the patient with another provider:  No   Independant visualization of image, tracing, or specimen:  Yes (CXR)      Procedures    Results    XR CHEST PA LAT    Final Result: Impression:         No signs of acute cardiothoracic abnormality. Stable linear scar left midlung.       Labs Reviewed - No data to display    No results found for this or any previous visit (from the past 12 hour(s)).    1:08 PM   Catelyn Hausner's  results have been reviewed with her.  She has been counseled regarding her diagnosis, treatment, and plan.  She verbally conveys understanding and agreement of the signs, symptoms, diagnosis, treatment and prognosis and additionally agrees to follow up as discussed.  She also agrees with the care-plan and conveys that all of her questions have been answered.  I have also provided discharge instructions for her that include: educational information regarding their diagnosis and treatment, and list of reasons why they would want to return to the ED prior to their follow-up appointment, should her condition change.    MEDICATIONS GIVEN:  Medications  albuterol-ipratropium (DUO-NEB) 2.5 MG-0.5 MG/3 ML (3 mL  Nebulization Given 11/18/12 1244)   predniSONE (DELTASONE) tablet 60 mg (60 mg Oral Given 11/18/12 1244)       CLINICAL IMPRESSION  1. Bronchospasm    2. Bronchitis        Recorded by Annie Sable, ED Scribe, as dictated by Bailey Mech, MD

## 2012-11-18 NOTE — ED Notes (Signed)
"   I had bronchitis and pneumonia last month and I'm having some coughing and shortness of breath."

## 2013-02-01 LAB — HEMOGLOBIN A1C WITH EAG: Hemoglobin A1c: 6.9 % — ABNORMAL HIGH (ref 4.8–5.6)

## 2013-02-02 LAB — LIPID PANEL
Cholesterol, total: 188 mg/dL (ref 100–199)
HDL Cholesterol: 70 mg/dL (ref >39)
LDL, calculated: 102 mg/dL — ABNORMAL HIGH (ref 0–99)
Triglyceride: 81 mg/dL (ref 0–149)
VLDL, calculated: 16 mg/dL (ref 5–40)

## 2013-02-02 LAB — CVD REPORT: PDF IMAGE: 0

## 2013-02-03 NOTE — Progress Notes (Signed)
Chief Complaint   Patient presents with   ??? Molly [name]     HPI: Pt is a 57 YO BF with a history of DM, HTN, high cholesterol and a family history of CAD who was seen for chest pain after bronchitis who is here for follow up. Since her last visit, she has felt well.  She denies any chest pain, SOB, CHF symptoms, syncope or Molly [name].  She was not able to take zocor due to myalgias.  She was on steroids on and off over the last 3 months.  She has lost a total of 22 lbs, 10 since I first saw her.  She has not been exercising regularly.    Past Medical History   Diagnosis Date   ??? Diabetes    ??? Hypertension    ??? Molly [name]    ??? Hyperlipidemia    ??? Thyroid disease      nodules   ??? Pneumonia    ??? Bronchitis      Past Surgical History   Procedure Laterality Date   ??? Hx hysterectomy     ??? Pr abdomen surgery proc unlisted     ??? Hx cholecystectomy       Current Outpatient Prescriptions   Medication Sig   ??? HOMEOPATHIC DRUGS (ENZYME PO) Take  by mouth.   ??? atorvastatin (LIPITOR) 10 mg tablet Take 1 Tab by mouth daily.   ??? OXYBUTYNIN CHLORIDE (DITROPAN PO) Take  by mouth.   ??? hydrochlorothiazide (HYDRODIURIL) 25 mg tablet Take 1 Tab by mouth daily.   ??? metoprolol (LOPRESSOR) 50 mg tablet Take 50 mg by mouth daily.   ??? OMEGA-3S/DHA/EPA/FISH OIL (OMEGA 3 PO) Take  by mouth.   ??? multivitamin (MULTIPLE VITAMIN ESSENTIAL) tablet Take 1 Tab by mouth daily.   ??? garlic cap Take  by mouth.   ??? mometasone (NASONEX) 50 mcg/actuation nasal spray 2 Sprays daily.   ??? NIFEdipine ER (ADALAT CC) 30 mg ER tablet Take 30 mg by mouth daily.   ??? losartan (COZAAR) 100 mg tablet Take 100 mg by mouth daily.   ??? metFORMIN (GLUCOPHAGE) 500 mg tablet Take 500 mg by mouth two (2) times daily (with meals).   ??? cholecalciferol (VITAMIN D3) 1,000 unit tablet Take 4,000 Units by mouth daily.   ??? B.infantis-B.ani-B.long-B.bifi (PROBIOTIC 4X) 10-15 mg TbEC Take  by mouth.   ??? methylPREDNISolone (MEDROL, PAK,) 4 mg tablet Per dose pack instructions   ???  albuterol (PROVENTIL HFA, VENTOLIN HFA) 90 mcg/actuation inhaler Take 2 Puffs by inhalation every four (4) hours as needed for Wheezing.   ??? ascorbic acid (VITAMIN C) powd Take  by mouth.   ??? DEXTROSE (GLUCOSE PO) Take  by mouth two (2) times a day.   ??? esomeprazole (NEXIUM) 40 mg capsule Take  by mouth daily.   ??? Aspirin, Buffered (BUFFERIN) 81 mg tab Take  by mouth.     No current facility-administered medications for this visit.     Allergies and Intolerances:   Allergies   Allergen Reactions   ??? Entex (Phenylephrine-Guaifenesin) Rash     Family History:   Family History   Problem Relation Age of Onset   ??? Arrhythmia Mother    ??? Heart Attack Mother    ??? Coronary Artery Disease Maternal Uncle    ??? Heart Attack Maternal Uncle        Social History:   She  reports that she has never smoked. She has never used smokeless tobacco.  She  reports  that she does not drink alcohol.      Review of Systems:   As above including,  Otherwise 11 point review of systems negative including constitutional, skin, HENT, eyes, respiratory, cardiovascular, gastrointestinal, genitourinary, musculoskeletal, endo/heme/aller, neurological.      Physical:   Vitals:   BP: 137/67  HR: 66  Wt: 220 lb (99.791 kg)    Exam: Physical Examination: General appearance - alert, well appearing, and in no distress, oriented to person, place, and time and overweight  Mental status - alert, oriented to person, place, and time, normal mood, behavior, speech, dress, motor activity, and thought processes  Eyes - pupils equal and reactive, extraocular eye movements intact  Mouth - mucous membranes moist, pharynx normal without lesions  Neck - supple, no significant adenopathy, carotids upstroke normal bilaterally, no bruits  Chest - clear to auscultation, no wheezes, rales or rhonchi, symmetric air entry  Heart - normal rate and regular rhythm, S1 and S2 normal, no murmurs noted, S4 present  Abdomen - soft, nontender, nondistended, no masses or organomegaly   Rectal - rectal exam not indicated  Neurological - alert, oriented, normal speech, no focal findings or movement disorder noted, motor and sensory grossly normal bilaterally  Musculoskeletal - no joint tenderness, deformity or swelling  Extremities - pedal edema trace +, intact peripheral pulses  Skin - normal coloration and turgor, no rashes, no suspicious skin lesions noted    Review of Data:   LABS:   Lab Results   Component Value Date/Time    WBC 6.1 10/19/2012  2:44 AM    HGB 12.0 10/19/2012  2:44 AM    HCT 39.0 10/19/2012  2:44 AM    PLATELET 201 10/19/2012  2:44 AM     Lab Results   Component Value Date/Time    Sodium 144 10/19/2012  2:44 AM    Potassium 3.7 10/19/2012  2:44 AM    Chloride 104 10/19/2012  2:44 AM    CO2 34 10/19/2012  2:44 AM    Glucose 104 10/19/2012  2:44 AM    BUN 12 10/19/2012  2:44 AM    Creatinine 0.93 10/19/2012  2:44 AM     Lab Results   Component Value Date/Time    Cholesterol, total 188 01/31/2013  9:44 AM     No results found for this basename: GPT     Lab Results   Component Value Date/Time    Hemoglobin A1c 6.9 01/31/2013  9:00 AM     ECHO: Left ventricle: Systolic function was normal. Ejection fraction was  estimated in the range of 55 % to 60 %. There were no regional wall motion  abnormalities. Wall thickness was at the upper limits of normal. Features  were consistent with a pseudonormal left ventricular filling pattern, with  concomitant abnormal relaxation and increased filling pressure (grade 2  diastolic dysfunction).    Left atrium: The atrium was mildly dilated.    Mitral valve: There was mild regurgitation.    Tricuspid valve: There was mild to moderate regurgitation. Pulmonary  artery systolic pressure was mildly to moderately increased.    Inferior vena cava, hepatic veins: The inferior vena cava was mildly  dilated. The respirophasic change in diameter was less than 50%.      STRESS TEST (EST, PHARM, NUC, ECHO etc): 1. No evidence of ischemia or infarction with gated  ejection fraction of   60%. Uncorrected fixed anterior defect likely due to breast attenuation   artifact.   2. Resting hypertension  with hypertensive response to exercise.  3. Poor exercise tolerance.      Impression / Plan:    Patient Active Problem List   Diagnosis Code   ??? Molly [name] 785.1   ??? Chest pain, unspecified 786.50   ??? Esophageal reflux 530.81   ??? HTN (hypertension) 401.9   ??? DM (diabetes mellitus) 250.00   ??? High cholesterol 272.0   ??? Family history of early CAD V17.3   ??? Obesity 278.00     I suspect a lot of her symptoms are due to diastolic dysfunction and seem better with better BP control and weight loss.  Encouraged her to continue her weight loss and exercise efforts.  Will try lipitor low dose to see if she can tolerate this instead of zocor.  Given all her risk factors and her slightly elevated LDL I think this is needed.  She asked that I check her Vit D level today.  She is seeing her PCP in May and while a lot of this care can be done there she would still like to come in for periodic visits.  Will see her again in 6 months and try and wean her to 1 year visits at that point.  Recheck cholesterol in 3 months.      Orders Placed This Encounter   ??? VITAMIN D, 1, 25 DIHYDROXY   ??? AMB POC EKG ROUTINE W/ 12 LEADS, INTER & REP   ??? HOMEOPATHIC DRUGS (ENZYME PO)   ??? atorvastatin (LIPITOR) 10 mg tablet

## 2013-02-03 NOTE — Telephone Encounter (Signed)
Called pt to discuss lab results and to inform her that she will need to increase her Zocor to 40 mg daily then repeat lipids in 3 mos.  Left msg for patient to call the office.

## 2013-02-03 NOTE — Telephone Encounter (Signed)
Message copied by Homero Fellers on Thu Feb 03, 2013  9:35 AM  ------       Message from: Daune Perch A       Created: Wed Feb 02, 2013  6:37 AM       Regarding: cholesterol         Still elevated.  Would increase zocor to 40mg  daily and recheck in 3 months       ----- Message -----          From: [facility] Results In [facility]          Sent: 02/01/2013   7:45 AM            To: Daune Perch, MD                       ------

## 2013-02-08 LAB — VITAMIN D, 1, 25 DIHYDROXY: Calcitriol (Vit D 1, 25 di-OH): 51.4 pg/mL (ref 10.0–75.0)

## 2013-02-09 NOTE — Telephone Encounter (Signed)
Called pt and informed her that her Vit D level is normal.

## 2013-02-22 NOTE — Telephone Encounter (Signed)
Patient was also asking about her BP, when she gets up in the am it is high and then its 130's /70's around noon and then its high again when she gets home around 3:30 or 4:00, its 180/98. Wants to know what she should do? Told her I would discuss with Dr. Hyman Hopes and get back with her.

## 2013-02-23 NOTE — Telephone Encounter (Signed)
Patient wants to spread out her medications and see how that affects her BP before she increases her Adalat. Will monitor her BP and keep a record and let us know how it does.  She doesn't want to take the lipitor and wants to take an OTC (garlic) for several more weeks and then repeat her lipids, if still elevated will start the Lipitor then

## 2013-06-28 NOTE — Progress Notes (Signed)
Chief Complaint   Patient presents with   ??? Hypertension   ??? Cholesterol Problem     HPI: Pt is a 57 YO BF with a history of DM, HTN, high cholesterol and a family history of CAD who was seen for chest pain after bronchitis who is here for follow up. Since her last visit, she has felt well.  She denies any chest pain, SOB, CHF symptoms, syncope or palpitations.  She was not able to take zocor due to myalgias.  She has lost a total of 29 lbs.   She has not been exercising regularly. Going to alternative medicine doctor doing different therapies for weight loss.  Didn't try lipitor as she wanted to see if she could bring down cholesterol with diet and exercise alone.    Past Medical History   Diagnosis Date   ??? Diabetes    ??? Hypertension    ??? Palpitations    ??? Hyperlipidemia    ??? Thyroid disease      nodules   ??? Pneumonia    ??? Bronchitis      Past Surgical History   Procedure Laterality Date   ??? Hx hysterectomy     ??? Pr abdomen surgery proc unlisted     ??? Hx cholecystectomy       Current Outpatient Prescriptions   Medication Sig   ??? MILK THISTLE PO Take  by mouth.   ??? SENNOSIDES (SENNA HERBAL LAXATIVE PO) Take  by mouth.   ??? HOMEOPATHIC DRUGS (ENZYME PO) Take  by mouth.   ??? OXYBUTYNIN CHLORIDE (DITROPAN PO) Take  by mouth.   ??? metoprolol (LOPRESSOR) 50 mg tablet Take 50 mg by mouth daily.   ??? mometasone (NASONEX) 50 mcg/actuation nasal spray 2 Sprays daily.   ??? NIFEdipine ER (ADALAT CC) 30 mg ER tablet Take 30 mg by mouth daily.   ??? losartan (COZAAR) 100 mg tablet Take 100 mg by mouth daily.   ??? metFORMIN (GLUCOPHAGE) 500 mg tablet Take 500 mg by mouth two (2) times daily (with meals).     No current facility-administered medications for this visit.     Allergies and Intolerances:   Allergies   Allergen Reactions   ??? Entex (Phenylephrine-Guaifenesin) Rash     Family History:   Family History   Problem Relation Age of Onset   ??? Arrhythmia Mother    ??? Heart Attack Mother    ??? Coronary Artery Disease Maternal Uncle     ??? Heart Attack Maternal Uncle        Social History:   She  reports that she has never smoked. She has never used smokeless tobacco.  She  reports that she does not drink alcohol.      Review of Systems:   As above including,  Otherwise 11 point review of systems negative including constitutional, skin, HENT, eyes, respiratory, cardiovascular, gastrointestinal, genitourinary, musculoskeletal, endo/heme/aller, neurological.      Physical:   Vitals:   BP: 127/79  HR: 68  Wt: 213 lb (96.616 kg)    Exam: Physical Examination: General appearance - alert, well appearing, and in no distress, oriented to person, place, and time and overweight  Mental status - alert, oriented to person, place, and time, normal mood, behavior, speech, dress, motor activity, and thought processes  Eyes - pupils equal and reactive, extraocular eye movements intact  Mouth - mucous membranes moist, pharynx normal without lesions  Neck - supple, no significant adenopathy, carotids upstroke normal bilaterally, no bruits  Chest - clear to auscultation, no wheezes, rales or rhonchi, symmetric air entry  Heart - normal rate and regular rhythm, S1 and S2 normal, no murmurs noted, S4 present  Abdomen - soft, nontender, nondistended, no masses or organomegaly  Rectal - rectal exam not indicated  Neurological - alert, oriented, normal speech, no focal findings or movement disorder noted, motor and sensory grossly normal bilaterally  Musculoskeletal - no joint tenderness, deformity or swelling  Extremities - no pedal edema, intact peripheral pulses  Skin - normal coloration and turgor, no rashes, no suspicious skin lesions noted    Review of Data:   LABS:   Lab Results   Component Value Date/Time    WBC 6.1 10/19/2012  2:44 AM    HGB 12.0 10/19/2012  2:44 AM    HCT 39.0 10/19/2012  2:44 AM    PLATELET 201 10/19/2012  2:44 AM     Lab Results   Component Value Date/Time    Sodium 144 10/19/2012  2:44 AM    Potassium 3.7 10/19/2012  2:44 AM    Chloride 104  10/19/2012  2:44 AM    CO2 34 10/19/2012  2:44 AM    Glucose 104 10/19/2012  2:44 AM    BUN 12 10/19/2012  2:44 AM    Creatinine 0.93 10/19/2012  2:44 AM     Lab Results   Component Value Date/Time    Cholesterol, total 188 01/31/2013  9:44 AM     No results found for this basename: GPT     Lab Results   Component Value Date/Time    Hemoglobin A1c 6.9 01/31/2013  9:00 AM     ECHO: Left ventricle: Systolic function was normal. Ejection fraction was  estimated in the range of 55 % to 60 %. There were no regional wall motion  abnormalities. Wall thickness was at the upper limits of normal. Features  were consistent with a pseudonormal left ventricular filling pattern, with  concomitant abnormal relaxation and increased filling pressure (grade 2  diastolic dysfunction).    Left atrium: The atrium was mildly dilated.    Mitral valve: There was mild regurgitation.    Tricuspid valve: There was mild to moderate regurgitation. Pulmonary  artery systolic pressure was mildly to moderately increased.    Inferior vena cava, hepatic veins: The inferior vena cava was mildly  dilated. The respirophasic change in diameter was less than 50%.      STRESS TEST (EST, PHARM, NUC, ECHO etc): 1. No evidence of ischemia or infarction with gated ejection fraction of   60%. Uncorrected fixed anterior defect likely due to breast attenuation   artifact.   2. Resting hypertension with hypertensive response to exercise.  3. Poor exercise tolerance.      Impression / Plan:    Patient Active Problem List   Diagnosis Code   ??? Palpitations 785.1   ??? Chest pain, unspecified 786.50   ??? Esophageal reflux 530.81   ??? HTN (hypertension) 401.9   ??? DM (diabetes mellitus) 250.00   ??? High cholesterol 272.0   ??? Family history of early CAD V17.3   ??? Obesity 278.00     Doing terrific with weight loss and encouraged her to continue efforts but to add activity.  Will recheck lipid panel today.  As long as she is doing well, can be followed at this point with PCP.       Orders Placed This Encounter   ??? LIPID PANEL   ??? MILK THISTLE PO   ???  SENNOSIDES (SENNA HERBAL LAXATIVE PO)

## 2013-06-29 LAB — CVD REPORT: PDF IMAGE: 0

## 2013-06-29 LAB — LIPID PANEL
Cholesterol, total: 176 mg/dL (ref 100–199)
HDL Cholesterol: 81 mg/dL (ref >39)
LDL, calculated: 84 mg/dL (ref 0–99)
Triglyceride: 55 mg/dL (ref 0–149)
VLDL, calculated: 11 mg/dL (ref 5–40)

## 2013-06-30 NOTE — Telephone Encounter (Signed)
Lft msg for pt to call back for results.

## 2013-06-30 NOTE — Telephone Encounter (Signed)
Message copied by Homero Fellers on Thu Jun 30, 2013 10:22 AM  ------       Message from: Daune Perch A       Created: Thu Jun 30, 2013  6:53 AM       Regarding: labs         Look great!  Continue with diet and exercise!       ----- Message -----          From: [facility] In [facility]          Sent: 06/29/2013  11:43 AM            To: Daune Perch, MD                       ------

## 2013-07-01 NOTE — Telephone Encounter (Signed)
Patient returned phone call and I informed her that per Dr. Hyman Hopes her labs look great and to continue with diet and exercise.

## 2013-08-10 NOTE — Telephone Encounter (Signed)
A user error has taken place: encounter opened in error, closed for administrative reasons.

## 2013-11-23 LAB — CBC WITH AUTOMATED DIFF
ABS. BASOPHILS: 0.1 10*3/uL — ABNORMAL HIGH (ref 0.0–0.06)
ABS. EOSINOPHILS: 0.4 10*3/uL (ref 0.0–0.4)
ABS. LYMPHOCYTES: 2.2 10*3/uL (ref 0.9–3.6)
ABS. MONOCYTES: 0.6 10*3/uL (ref 0.05–1.2)
ABS. NEUTROPHILS: 2.6 10*3/uL (ref 1.8–8.0)
BASOPHILS: 1 % (ref 0–2)
EOSINOPHILS: 6 % — ABNORMAL HIGH (ref 0–5)
HCT: 38.5 % (ref 35.0–45.0)
HGB: 12.2 g/dL (ref 12.0–16.0)
LYMPHOCYTES: 37 % (ref 21–52)
MCH: 24.6 PG (ref 24.0–34.0)
MCHC: 31.7 g/dL (ref 31.0–37.0)
MCV: 77.6 FL (ref 74.0–97.0)
MONOCYTES: 10 % (ref 3–10)
MPV: 11.9 FL — ABNORMAL HIGH (ref 9.2–11.8)
NEUTROPHILS: 46 % (ref 40–73)
PLATELET: 188 10*3/uL (ref 135–420)
RBC: 4.96 M/uL (ref 4.20–5.30)
RDW: 16.2 % — ABNORMAL HIGH (ref 11.6–14.5)
WBC: 5.9 10*3/uL (ref 4.6–13.2)

## 2013-11-23 LAB — METABOLIC PANEL, BASIC
Anion gap: 6 mmol/L (ref 3.0–18)
BUN/Creatinine ratio: 8 — ABNORMAL LOW (ref 12–20)
BUN: 7 MG/DL (ref 7.0–18)
CO2: 32 mmol/L (ref 21–32)
Calcium: 9 MG/DL (ref 8.5–10.1)
Chloride: 107 mmol/L (ref 100–108)
Creatinine: 0.84 MG/DL (ref 0.6–1.3)
GFR est AA: >60 mL/min/{1.73_m2} (ref >60)
GFR est non-AA: >60 mL/min/{1.73_m2} (ref >60)
Glucose: 86 mg/dL (ref 74–99)
Potassium: 3.3 mmol/L — ABNORMAL LOW (ref 3.5–5.5)
Sodium: 145 mmol/L (ref 136–145)

## 2013-11-23 LAB — CARDIAC PANEL,(CK, CKMB & TROPONIN)
CK - MB: 1.9 ng/ml (ref 0.5–3.6)
CK-MB Index: 0.5 % (ref 0.0–4.0)
CK: 376 U/L — ABNORMAL HIGH (ref 26–192)
Troponin-I, QT: <0.02 NG/ML (ref 0.00–0.06)

## 2013-11-23 MED ORDER — ALBUTEROL SULFATE 0.083 % (0.83 MG/ML) SOLN FOR INHALATION
2.5 mg /3 mL (0.083 %) | RESPIRATORY_TRACT | Status: AC
Start: 2013-11-23 — End: 2013-11-23
  Administered 2013-11-23: 22:00:00 via RESPIRATORY_TRACT

## 2013-11-23 MED ORDER — METHYLPREDNISOLONE (PF) 125 MG/2 ML IJ SOLR
125 mg/2 mL | INTRAMUSCULAR | Status: AC
Start: 2013-11-23 — End: 2013-11-23
  Administered 2013-11-23: 22:00:00 via INTRAVENOUS

## 2013-11-23 NOTE — ED Notes (Signed)
Pt continues to cough, cough is more congested.

## 2013-11-23 NOTE — ED Notes (Signed)
Pt discharged to home, questions answered, denies further questions, verbalized understanding of instructions.  Armband removed and shredded. Pt advised to not drive or work while taking prescribed medication. Pt educated regarding pain management and any pain medications prescribed, pt also educated on non pharmacologic pain management.

## 2013-11-23 NOTE — ED Provider Notes (Signed)
HPI Comments:   4:15 PM   57 y.o. female presents to ED C/O nonproductive cough. States she had ear infection 2 wks ago and was treated by [facility] physician. Pt told to take Mucinex in addition to inhaler. Pt states Mucinex was not reliving sx.  [facility] where she was Dx'd with bronchitis and Rx'd Z-pak and Tussionex, which did not relieve her difficulty breathing. Associated CP secondary to cough, chills last wk. Stress test done last yr. PMH DM. Denies current ear pain, abd pain, N/V, fevers, or other sx or complaints.     Patient is a 57 y.o. female presenting with cough. The history is provided by the patient. No language interpreter was used.   Cough  This is a new problem. The current episode started more than 1 week ago (2 wks). The problem occurs constantly. The problem has not changed since onset.The cough is non-productive. Associated symptoms include chest pain (secondary to cough) and chills. Pertinent negatives include no ear pain, no headaches, no shortness of breath and no nausea.   Written by Marta Antu. Delford Field, ED Scribe, as dictated by Zebedee Iba, PA-C       Past Medical History   Diagnosis Date   ??? Diabetes    ??? Hypertension    ??? Palpitations    ??? Hyperlipidemia    ??? Thyroid disease      nodules   ??? Pneumonia    ??? Bronchitis         Past Surgical History   Procedure Laterality Date   ??? Hx hysterectomy     ??? Pr abdomen surgery proc unlisted     ??? Hx cholecystectomy           Family History   Problem Relation Age of Onset   ??? Arrhythmia Mother    ??? Heart Attack Mother    ??? Coronary Artery Disease Maternal Uncle    ??? Heart Attack Maternal Uncle         History     Social History   ??? Marital Status: MARRIED     Spouse Name: N/A     Number of Children: N/A   ??? Years of Education: N/A     Occupational History   ??? Not on file.     Social History Main Topics   ??? Smoking status: Never Smoker    ??? Smokeless tobacco: Never Used   ??? Alcohol Use: No   ??? Drug Use: Not on file   ??? Sexually  Active: Not on file     Other Topics Concern   ??? Not on file     Social History Narrative   ??? No narrative on file                  ALLERGIES: Entex      Review of Systems   Constitutional: Positive for chills. Negative for fever.   HENT: Negative for ear pain, congestion, neck pain and neck stiffness.    Eyes: Negative for photophobia, pain and discharge.   Respiratory: Positive for cough. Negative for chest tightness and shortness of breath.    Cardiovascular: Positive for chest pain (secondary to cough). Negative for palpitations.   Gastrointestinal: Negative for nausea, abdominal pain and diarrhea.   Genitourinary: Negative for dysuria and frequency.   Musculoskeletal: Negative for arthralgias.   Skin: Negative for color change.   Neurological: Negative for dizziness and headaches.   Hematological: Negative for adenopathy.  Psychiatric/Behavioral: Negative for behavioral problems.   All other systems reviewed and are negative.        Filed Vitals:    11/23/13 1652 11/23/13 1700 11/23/13 1730 11/23/13 1731   BP: 195/89 193/77 172/80    Pulse:       Temp:       Resp:       Height:       Weight:       SpO2: 99% 97%  97%            Physical Exam   Nursing note and vitals reviewed.   Vital signs and nursing notes reviewed.    CONSTITUTIONAL: Alert. Well-appearing; well-nourished; in no apparent distress.  HEAD: Normocephalic; atraumatic.  EYES: PERRL; Conjunctiva clear.   ENT: TM's normal. External ear normal. Normal nose; no rhinorrhea. Normal pharynx. Tonsils not enlarged without exudate. Moist mucus membranes.  NECK: Supple; FROM without difficulty, non-tender; no cervical lymphadenopathy.             CV: Normal S1, S2; no murmurs, rubs, or gallops. No chest wall tenderness.  RESPIRATORY: Normal chest excursion with respiration; +dry cough. Bilateral expiratory wheezes, no rhonchi or rales.   GI: Non-distended; non-tender.  EXT: Normal ROM in all four extremities; non-tender to palpation. No edema or calf  tenderness.   SKIN: Normal for age and race; warm; dry; good turgor; no apparent lesions or exudate.  NEURO: A & O x3.   PSYCH:  Mood and affect appropriate.         RESULTS:      EKG interpretation: (Preliminary)  5:04 PM   NSR. Minimal voltage criteria for LVH, may be normal variant. Borderline ECG. Vent rate 69 bpm.    EKG read by Josphat Musapatike, MD  at 5:05 PM        XR CHEST PA LAT    Final Result: Impression:         Mild scarring. No active disease or interval change.            Labs Reviewed   CBC WITH AUTOMATED DIFF - Abnormal; Notable for the following:     RDW 16.2 (*)     MPV 11.9 (*)     EOSINOPHILS 6 (*)     ABS. BASOPHILS 0.1 (*)     All other components within normal limits   METABOLIC PANEL, BASIC - Abnormal; Notable for the following:     Potassium 3.3 (*)     BUN/Creatinine ratio 8 (*)     All other components within normal limits   CARDIAC PANEL,(CK, CKMB & TROPONIN) - Abnormal; Notable for the following:     CK 376 (*)     All other components within normal limits       Recent Results (from the past 12 hour(s))   EKG, 12 LEAD, INITIAL    Collection Time     11/23/13  3:32 PM       Result Value Range    Ventricular Rate 69      Atrial Rate 69      P-R Interval 164      QRS Duration 84      Q-T Interval 406      QTC Calculation (Bezet) 435      Calculated P Axis 43      Calculated R Axis 39      Calculated T Axis 8      Diagnosis        Value:  Normal sinus rhythm      Minimal voltage criteria for LVH, may be normal variant      Borderline ECG      When compared with ECG of 19-Oct-2012 02:41,      No significant change was found   CBC WITH AUTOMATED DIFF    Collection Time     11/23/13  4:40 PM       Result Value Range    WBC 5.9  4.6 - 13.2 K/uL    RBC 4.96  4.20 - 5.30 M/uL    HGB 12.2  12.0 - 16.0 g/dL    HCT 16.1  09.6 - 04.5 %    MCV 77.6  74.0 - 97.0 FL    MCH 24.6  24.0 - 34.0 PG    MCHC 31.7  31.0 - 37.0 g/dL    RDW 40.9 (*) 81.1 - 14.5 %    PLATELET 188  135 - 420 K/uL    MPV 11.9 (*)  9.2 - 11.8 FL    NEUTROPHILS 46  40 - 73 %    LYMPHOCYTES 37  21 - 52 %    MONOCYTES 10  3 - 10 %    EOSINOPHILS 6 (*) 0 - 5 %    BASOPHILS 1  0 - 2 %    ABS. NEUTROPHILS 2.6  1.8 - 8.0 K/UL    ABS. LYMPHOCYTES 2.2  0.9 - 3.6 K/UL    ABS. MONOCYTES 0.6  0.05 - 1.2 K/UL    ABS. EOSINOPHILS 0.4  0.0 - 0.4 K/UL    ABS. BASOPHILS 0.1 (*) 0.0 - 0.06 K/UL    PLATELET COMMENTS SLIDE ESTIMATE CONFIRMS PLT COUNT      RBC COMMENTS ANISOCYTOSIS      RBC COMMENTS 1+      RBC COMMENTS MICROCYTOSIS      RBC COMMENTS 1+      RBC COMMENTS TEARDROP CELLS      RBC COMMENTS FEW      DF AUTOMATED     METABOLIC PANEL, BASIC    Collection Time     11/23/13  4:40 PM       Result Value Range    Sodium 145  136 - 145 mmol/L    Potassium 3.3 (*) 3.5 - 5.5 mmol/L    Chloride 107  100 - 108 mmol/L    CO2 32  21 - 32 mmol/L    Anion gap 6  3.0 - 18 mmol/L    Glucose 86  74 - 99 mg/dL    BUN 7  7.0 - 18 MG/DL    Creatinine 9.14  0.6 - 1.3 MG/DL    BUN/Creatinine ratio 8 (*) 12 - 20      GFR est AA >60  >60 ml/min/1.34m2    GFR est non-AA >60  >60 ml/min/1.74m2    Calcium 9.0  8.5 - 10.1 MG/DL   CARDIAC PANEL,(CK, CKMB & TROPONIN)    Collection Time     11/23/13  4:40 PM       Result Value Range    CK 376 (*) 26 - 192 U/L    CK - MB 1.9  0.5 - 3.6 ng/ml    CK-MB Index 0.5  0.0 - 4.0 %    Troponin-I, Qt. <0.02  0.00 - 0.06 NG/ML          MDM     Amount and/or Complexity of Data Reviewed:   Clinical lab tests:  Ordered and reviewed  Tests in the radiology section of CPT??:  Ordered and reviewed (CXR)  Tests in the medicine section of the CPT??:  Ordered and reviewed (EKG)  Discussion of test results with the performing providers:  No   Decide to obtain previous medical records or to obtain history from someone other than the patient:  No   Obtain history from someone other than the patient:  No   Review and summarize past medical records:  No   Discuss the patient with another provider:  No   Independant visualization of image, tracing, or specimen:   Yes (EKG, CXR)          Medications   methylPREDNISolone (PF) (SOLU-MEDROL) injection 125 mg (125 mg IntraVENous Given 11/23/13 1653)   albuterol (PROVENTIL VENTOLIN) nebulizer solution 2.5 mg (2.5 mg Nebulization Given 11/23/13 1655)          Procedures    PROGRESS NOTE:  4:15 PM    Initial assessment performed.  Written by Loyal Jacobson, ED Scribe, as dictated by Zebedee Iba, PA-C     5:26 PM    Pt feels much better after albuterol tx.  Aleida Walz's  results have been reviewed with her.  She has been counseled regarding her diagnosis, treatment, and plan.  She verbally conveys understanding and agreement of the signs, symptoms, diagnosis, treatment and prognosis and additionally agrees to follow up as discussed.  She also agrees with the care-plan and conveys that all of her questions have been answered.  I have also provided discharge instructions for her that include: educational information regarding their diagnosis and treatment, and list of reasons why they would want to return to the ED prior to their follow-up appointment, should her condition change.     CLINICAL IMPRESSION:    1. Bronchitis with bronchospasm        PLAN:  1. D/C Home  2. Rx'd Medrol, Albuterol, Cheratussin, continue Zithromax  3. F/U with PCP  4. Return if sxs worsen      Written by Loyal Jacobson, ED Scribe, as dictated by Zebedee Iba, PA-C

## 2013-11-23 NOTE — ED Notes (Signed)
Pt up to bathroom to void. Pt given a specimen cup.

## 2013-11-23 NOTE — ED Notes (Signed)
Pt states she is feeling better since receiving the neb tx.

## 2013-11-23 NOTE — ED Notes (Signed)
Pt states she has been sick for 2 weeks, with cough and congestion. Pt was out of town and was seen at a hospital then also.  Pt was seen by her PCP 2 weeks ago.  Pt states last night she developed tightness in her chest and pain under left breast

## 2013-11-23 NOTE — ED Notes (Addendum)
Pt states "I've been dealing with a cold for several weeks. I went to [facility] yesterday [facility] and they diagnosed me with bronchitis. They wanted me to keep taking mucinex, claritin and they gave me a z-pak. That tussionex is awful". Pt presents today secondary to a complaint of rib pain with cough, difficulty breathing. Pt states has not received a chest x-ray. Pt states did not take BP medications today.

## 2013-11-24 LAB — EKG, 12 LEAD, INITIAL
Atrial Rate: 69 {beats}/min
Calculated P Axis: 43 degrees
Calculated R Axis: 39 degrees
Calculated T Axis: 8 degrees
Diagnosis: NORMAL
P-R Interval: 164 ms
Q-T Interval: 406 ms
QRS Duration: 84 ms
QTC Calculation (Bezet): 435 ms
Ventricular Rate: 69 {beats}/min

## 2013-11-24 NOTE — ED Provider Notes (Signed)
I was personally available for consultation in the emergency department. I have reviewed the chart prior to the patient's discharge and agree with the documentation recorded by the MLP, including the assessment, treatment plan, and disposition.

## 2013-11-25 LAB — EKG, 12 LEAD, INITIAL
Atrial Rate: 73 {beats}/min
Calculated P Axis: 42 degrees
Calculated R Axis: 44 degrees
Calculated T Axis: 8 degrees
Diagnosis: NORMAL
P-R Interval: 160 ms
Q-T Interval: 404 ms
QRS Duration: 80 ms
QTC Calculation (Bezet): 445 ms
Ventricular Rate: 73 {beats}/min

## 2013-11-25 NOTE — Other (Signed)
Cardiology:  EKG Chart Audit / Missing EKG Order / EKG Order Mismatch

## 2014-09-04 DIAGNOSIS — E041 Nontoxic single thyroid nodule: Secondary | ICD-10-CM | POA: Insufficient documentation

## 2014-11-06 ENCOUNTER — Encounter: Payer: TRICARE (CHAMPUS) | Primary: Family Medicine

## 2014-11-13 ENCOUNTER — Inpatient Hospital Stay: Admit: 2014-11-13 | Payer: TRICARE (CHAMPUS) | Primary: Family Medicine

## 2014-11-13 DIAGNOSIS — M25511 Pain in right shoulder: Secondary | ICD-10-CM

## 2014-11-13 NOTE — Progress Notes (Signed)
[facility]  [address], [place], [place] [id number]  Phone: [phone]      Fax:  [phone]    Plan of Care/ Statement of Necessity for Physical Therapy Services    Patient name: Molly [name] Start of Care: 11/13/2014   Referral source: Lavina HammanLevy, Jeffrey A, MD DOB: [birth year]    Medical Diagnosis: Pain in right shoulder [M25.511]   Onset Date:03/2014    Treatment Diagnosis: R shoulder pain   Prior Hospitalization: see medical history Provider#: [id number]   Medications: Verified on Patient summary List    Comorbidities: CS, carpal tunnel   Prior Level of Function: Full function without shoulder pain      The Plan of Care and following information is based on the information from the initial evaluation.  Assessment/ key information: [age] YOF with progressive R shoulder pain since 03/2014. Objective findings include bilateral limitations in active and passive shoulder mobility, deficits in TS Ext mobility, postural alignment fault including shoulder protraction and FHP, limited function esp for lateral and overhead reaching. Patient presents with normal strength and positive Empty can test R for pain. Symptoms are consistent with Supraspinatus tendonitis and mild irritation of long biceps tendon. Patient is a good candidate for skilled PT.    Problem List: pain affecting function, decrease ROM, decrease ADL/ functional abilitiies, decrease activity tolerance and decrease flexibility/ joint mobility   Treatment Plan may include any combination of the following: Therapeutic exercise, Therapeutic activities, Neuromuscular re-education, Physical agent/modality, Manual therapy and Patient education  Patient / Family readiness to learn indicated by: asking questions  Persons(s) to be included in education: patient (P)  Barriers to Learning/Limitations: no  Patient Goal (s): ???Better range of motion???  Patient Self Reported Health Status: good  Rehabilitation Potential: good     Short Term Goals: To be accomplished in 1-2  weeks:   1. Improved postural awareness to allow self correction for improved shoulder girdle alignment  2. Initiate HEP with good compliance  3. Improve bilateral shoulder ROM to >or= 140 deg for Flexion to allow increased ease with overhead reaching  Long Term Goals: To be accomplished in 3-4  weeks:   1. Improved Foto score to >or=66/100 points for increased ease with all housekeeping involving overhead , lateral and back reaching  2.Improved trunk strength and TS mobility to allow several minutes of overhead activities for return to full function  3. Patient able to perform all ADL with pain of <or= 3/10   Frequency / Duration: Patient to be seen 2 times per week for 4  weeks.    Patient/ Caregiver education and instruction: Diagnosis, prognosis, activity modification and exercises     Plan of care has been reviewed with PTA      Gretta ArabAnette  Jataya Wann, PT 11/13/2014 11:02 AM  _____________________________________________________________________  I certify that the above Therapy Services are being furnished while the patient is under my care. I agree with the treatment plan and certify that this therapy is necessary.    Physician's Signature:____________________  Date:__________Time:______    Please sign and return to   [facility]  [address], [place], [place] [id number]     Phone: [phone]      Fax:  [phone]

## 2014-11-13 NOTE — Progress Notes (Signed)
PT DAILY TREATMENT NOTE - MCR 8-14    Patient Name: Molly [name]  Date:11/13/2014  DOB: Nov 07, 1956    Patient DOB Verified  Payor: TRICARE / Plan: [facility] [employer]PENDENTS / Product Type: T[employer]are /    In time:11  Out time:12  Total Treatment Time (min): 60  Total Timed Codes (min): 60  1:1 Treatment Time ([facility] only): n/a   Visit #: 1 of 8    Treatment Area: Pain in right shoulder [M25.511]    SUBJECTIVE  Pain Level (0-10 scale): 2-3  Any medication changes, allergies to medications, adverse drug reactions, diagnosis change, or new procedure performed?:  No     Yes (see summary sheet for update)  Subjective functional status/changes:    No changes reported  Onset of R shoulder pain in 03/2014 with progressive pain over the summer. No recall of inhury. At present R shoulder /neck tightness and band type pain in upper arm with any ABD and back reaching. Minimal pain at night with certain.   movements.Medication is helping. Use of heat also helps.No testing performed except x-rays.  Reports of bilateral hand tingling due to carpal tunnel.    OBJECTIVE  Modality rationale: decrease pain and increase tissue extensibility to improve the patient???s ability to perform ADL with less pain   Min Type Additional Details     Estim: Att   Unatt        TENS instruct                  IFC  Premod   NMES                     Other:  w/US   w/ice   w/heat  Position:  Location:      Traction:  Cervical       Lumbar                        Prone          Supine                       Intermittent   Continuous Lbs:   before manual   after manual      Ultrasound: Continuous    Pulsed                           1MHz   3MHz Location:  W/cm2:      Iontophoresis with dexamethasone         Location:  Take home patch    In clinic   3   Ice       heat    Ice massage Position:seated  Location:R ant sh      Vasopneumatic Device Pressure:        lo  med  hi   Temperature:  lo  med  hi     Skin assessment post-treatment:  intact redness- no adverse reaction    redness ??? adverse reaction:       15 min Therapeutic Exercise:   See flow sheet :   Rationale: increase ROM and and posture for better functional mobility     15 min Therapeutic Activity:    See flow sheet :instruction in HEP, POC   Rationale: compliance with HEP and treatment plan  to improve the patient???s ability to  10 min Manual Therapy:  CFM to supraspinatus insertion, gentle GHJ mobs and PROM for end range Flex, ice massage to anterior shoulder   Rationale: decrease pain, increase ROM and increase tissue extensibility to      min Gait Training:  ___ feet with ___ device on level surfaces with ___ level of assist   Rationale:           min Patient Education:  Review HEP     Progressed/Changed HEP based on:    positioning    body mechanics    transfers    heat/ice application        Other Objective/Functional Measures: as per evaluation    Physical Therapy Evaluation - Shoulder    Posture:  Poor     Fair     Good    Describe:protracted shoulders, FHP    ROM:   Unable to assess at this time                                           AROM                                                              PROM   Left Right  Left Right   Flexion 120 120P Flexion     Extension 40 30P Extension     Scaption/ABD 100 100P Scaptin/ABD     ER @ 0 Degrees 60 40P ER @ 0 Degrees     ER @ 90 Degrees   ER @ 90 Degrees  80P   IR @ 90 Degrees   IR @ 90 Degrees  20P   R horizontal ABD 80 with pain  End Feel / Painful Arc:    Strength:    Unable to assess at this time                                                                            L (1-5) R (1-5) Pain   Flexors 5 5  Yes    No   Abductors 5 5  Yes    No   External Rotators 5 5P  Yes    No   Internal Rotators 5 5  Yes    No   Supraspinatus 5 5P  Yes    No   Serratus Anterior 5 5  Yes    No   Lower Trapezius 3- 3-  Yes    No   Elbow Flexion 5 5  Yes    No   Elbow Extension 5 5  Yes    No        Scapulohumoral Control / Rhythm:  Able to eccentrically lower with good control? Left:  Yes    No     Right:  Yes    No    Accessory Motions:    Palpation   Min   Mod  Severe    Location:R supraspinatus insertion    Min   Mod   Severe    Location:R long biceps tendon   Min   Mod   Severe    Location:    Optional Tests:    Sensation Left Right Reflexes Left Right   Biceps (C5)   Biceps (C5)     Palmer Radial(C6-7)   Brachioradialis (C6)     Palmer Ulnar(C8-T1)   Triceps (C7)       Adson's Test   Pos    Neg Yergason's Test  Pos    Neg  Roo's Test   Pos    Neg Gilchrist's Sign  Pos    Neg  Neer's Test   Pos    Neg Clunk Test   Pos    Neg  Hawkin's Test   Pos    Neg AC Joint   Pos    Neg  Speed's Test   Pos    Neg SC Joint   Pos    Neg  Empty Can   Pos for pain R    Neg Pectoral Tightness  Pos    Neg  Anterior Apprehension  Pos    Neg   Posterior Apprehension  Pos    Neg      Other Tests / Comments: PRI: HGIR R 20, L 45       Pain Level (0-10 scale) post treatment: 1    ASSESSMENT/Changes in Function: good understanding of HEP and need for increased mobility in TS and both shoulders    Patient will continue to benefit from skilled PT services to address functional mobility deficits, address ROM deficits and assess and modify postural abnormalities to attain remaining goals.       See Plan of Care    See progress note/recertification    See Discharge Summary         Progress towards goals / Updated goals:  Slight increase in R shoulder ROM    PLAN    Upgrade activities as tolerated       Continue plan of care with focus on R shoulder mobility, MT    Update interventions per flow sheet         Discharge due to:_    Other:_      Gretta Arab, PT 11/13/2014  11:02 AM

## 2014-11-16 ENCOUNTER — Inpatient Hospital Stay: Admit: 2014-11-16 | Payer: TRICARE (CHAMPUS) | Primary: Family Medicine

## 2014-11-16 NOTE — Progress Notes (Signed)
PT DAILY TREATMENT NOTE - MCR 8-14    Patient Name: Molly [name]  Date:11/16/2014  DOB: 06/29/1956    Patient DOB Verified  Payor: TRICARE / Plan: [facility] [employer]PENDENTS / Product Type: T[employer]are /    In time:1115 Out time:151  Total Treatment Time (min): 36  Total Timed Codes (min): 36  1:1 Treatment Time ([facility] only): na   Visit #: 2 of 8    Treatment Area: Pain in right shoulder [M25.511]    SUBJECTIVE  Pain Level (0-10 scale): 2  Any medication changes, allergies to medications, adverse drug reactions, diagnosis change, or new procedure performed?:  No     Yes (see summary sheet for update)  Subjective functional status/changes:    No changes reported  Active with HEP, min pain today    OBJECTIVE    36 min Therapeutic Exercise:   See flow sheet :   Rationale: increase ROM, increase strength and improve coordination        Other Objective/Functional Measures:   No increase d pain with TE.  Good tolerance to PR execises     Pain Level (0-10 scale) post treatment: 1    ASSESSMENT/Changes in Function:     Patient will continue to benefit from skilled PT services to modify and progress therapeutic interventions, address functional mobility deficits and address ROM deficits to attain remaining goals.       See Plan of Care    See progress note/recertification    See Discharge Summary         Progress towards goals / Updated goals:  Short Term Goals: To be accomplished in 1-2  weeks:  1. Improved postural awareness to allow self correction for improved shoulder girdle alignment  2. Initiate HEP with good compliance  3. Improve bilateral shoulder ROM to >or= 140 deg for Flexion to allow increased ease with overhead reaching  Long Term Goals: To be accomplished in 3-4  weeks:  1. Improved Foto score to >or=66/100 points for increased ease with all housekeeping involving overhead , lateral and back reaching  2.Improved trunk strength and TS mobility to allow several minutes of  overhead activities for return to full function  3. Patient able to perform all ADL with pain of <or= 3/10     PLAN    Upgrade activities as tolerated       Continue plan of care    Update interventions per flow sheet         Discharge due to:_    Other:_      Beatris SiJames D Howard Patton, PTA 11/16/2014  12:07 PM

## 2014-11-27 ENCOUNTER — Inpatient Hospital Stay: Admit: 2014-11-27 | Payer: TRICARE (CHAMPUS) | Primary: Family Medicine

## 2014-11-27 DIAGNOSIS — M25511 Pain in right shoulder: Secondary | ICD-10-CM

## 2014-11-27 NOTE — Progress Notes (Signed)
PT DAILY TREATMENT NOTE - MCR 8-14    Patient Name: Aris LotShirley Vandunk  Date:11/27/2014  DOB: July 06, 1956    Patient DOB Verified  Payor: TRICARE / Plan: [facility] [employer]PENDENTS / Product Type: T[employer]are /    In time:330  Out time:420  Total Treatment Time (min): 50  Total Timed Codes (min): 40  1:1 Treatment Time ([facility] only): na   Visit #: 3 of 8    Treatment Area: Pain in right shoulder [M25.511]    SUBJECTIVE  Pain Level (0-10 scale): 2  Any medication changes, allergies to medications, adverse drug reactions, diagnosis change, or new procedure performed?:  No     Yes (see summary sheet for update)  Subjective functional status/changes:    No changes reported  Reports generally low pain. Reports soreness in R biceps    OBJECTIVE    Modality rationale: decrease pain and increase tissue extensibility   Min Type Additional Details     Estim: Att   Unatt        TENS instruct                  IFC  Premod   NMES                     Other:  w/US   w/ice   w/heat  Position:  Location:      Traction:  Cervical       Lumbar                        Prone          Supine                       Intermittent   Continuous Lbs:   before manual   after manual      Ultrasound: Continuous    Pulsed                           1MHz   3MHz Location:  W/cm2:      Iontophoresis with dexamethasone         Location:  Take home patch    In clinic   10   Ice       heat    Ice massage Position:  Location:      Laser    Other: Position:  Location:      Vasopneumatic Device Pressure:        lo  med  hi   Temperature:  lo  med  hi    Skin assessment post-treatment:  intact redness- no adverse reaction    redness ??? adverse reaction:     32 min Therapeutic Exercise:   See flow sheet :   Rationale: increase ROM, increase strength and improve coordination    8 min Manual Therapy:  STM to R biceps and detloid   Rationale: decrease pain, increase ROM and increase tissue extensibility           With    TE    TA    neuro     other: Patient Education:  Review HEP     Progressed/Changed HEP based on:    positioning    body mechanics    transfers    heat/ice application     other:      Other Objective/Functional Measures:   Soreness with movement but  no TTP in R UE     Pain Level (0-10 scale) post treatment: 1    ASSESSMENT/Changes in Function:     Patient will continue to benefit from skilled PT services to modify and progress therapeutic interventions, address functional mobility deficits, address ROM deficits and address strength deficits to attain remaining goals.       See Plan of Care    See progress note/recertification    See Discharge Summary         Progress towards goals / Updated goals:  Short Term Goals: To be accomplished in 1-2?? weeks:  1. Improved postural awareness to allow self correction for improved shoulder girdle alignment  2. Initiate HEP with good compliance  3. Improve bilateral shoulder ROM to >or= 140 deg for Flexion to allow increased ease with overhead reaching  Long Term Goals: To be accomplished in 3-4?? weeks:  1. Improved Foto score to >or=66/100 points for increased ease with all housekeeping involving overhead , lateral and back reaching  2.Improved trunk strength and TS mobility to allow several minutes of overhead activities for return to full function  3. Patient able to perform all ADL with pain of <or= 3/10       PLAN    Upgrade activities as tolerated       Continue plan of care    Update interventions per flow sheet         Discharge due to:_    Other:_      Beatris Si, PTA 11/27/2014  4:14 PM

## 2014-11-30 ENCOUNTER — Inpatient Hospital Stay: Admit: 2014-11-30 | Payer: TRICARE (CHAMPUS) | Primary: Family Medicine

## 2014-12-01 NOTE — Progress Notes (Signed)
PT DAILY TREATMENT NOTE - MCR 8-14    Patient Name: Molly [name]  Date:12/01/2014  DOB: 1956-04-26    Patient DOB Verified  Payor: TRICARE / Plan: [facility] [employer]PENDENTS / Product Type: T[employer]are /    In time:330  Out time:410  Total Treatment Time (min): 40  Total Timed Codes (min): 40  1:1 Treatment Time ([facility] only):    Visit #: 4 of 8    Treatment Area: Pain in right shoulder [M25.511]    Pain Level (0-10 scale): 2  Any medication changes, allergies to medications, adverse drug reactions, diagnosis change, or new procedure performed?:  No??????  Yes (see summary sheet for update)  Subjective functional status/changes:????  No changes reported  Reports generally low pain. Reports soreness in R biceps    OBJECTIVE  ????  Modality rationale:?? decrease pain and increase tissue extensibility??   Min?? Type?? Additional Details??   ??  Estim: Att???? Unatt?????????????? TENS instruct  ?????????????????????????????? IFC?? Premod???? NMES?? ??  ???????????????????????????????? Other: ?? w/US???? w/ice???? w/heat  Position:  Location:??   ?? ?? Traction:  Cervical???????????? Lumbar  ????????????????????????????????????????  Prone?????????????????? Supine  ???????????????????????????????????????? Intermittent???? Continuous?? Lbs:   before manual   after manual??   ?? ?? Ultrasound: Continuous????  Pulsed  ????????????????????????????????????????????????  ????  ?? Location:  W/cm2:??   ?? ?? Iontophoresis with dexamethasone ??  ?????????? Location:??  Take home patch ??   In clinic??   10?? ?? Ice???????? ?? heat  ?? Ice massage?? Position:  Location:??   ?? ?? Laser  ?? Other:?? Position:  Location:??   ?? ?? Vasopneumatic Device?? Pressure:????????????  lo  med  hi ??  Temperature:  lo  med  hi??    Skin assessment post-treatment:?? intact redness- no adverse reaction  ?? redness ??? adverse reaction:   ????  32?? min?? Therapeutic Exercise:??  See flow sheet :??   Rationale: increase ROM, increase strength and improve coordination  ????   8?? min?? Manual Therapy:?? STM to R biceps and detloid??   Rationale: decrease pain, increase ROM and increase tissue extensibility ??  ???????????????????????????????????????????????????????????????????????????????????????????????????????????????????????? ??  With  ??  TE  ??  TA  ??  neuro  ??  other:?? Patient Education:  Review HEP??    Progressed/Changed HEP based on: ??   positioning????  body mechanics????  transfers????  heat/ice application??    other: ??     Other Objective/Functional Measures:   Soreness with movement but no TTP in R UE????in general  Some TTP in biceps LH and ant shoulder today.  ????????    Pain Level (0-10 scale) post treatment: 1    ASSESSMENT/Changes in Function:     Patient will continue to benefit from skilled PT services to modify and progress therapeutic interventions, address functional mobility deficits, address ROM deficits and address strength deficits to attain remaining goals.  ????  ?? See Plan of Care  ?? See progress note/recertification  ?? See Discharge Summary  ???????? ??  Progress towards goals / Updated goals:  Short Term Goals: To be accomplished in 1-2?? weeks:  1. Improved postural awareness to allow self correction for improved shoulder girdle alignment  2. Initiate HEP with good  compliance(reports compliance)  3. Improve bilateral shoulder ROM to >or= 140 deg for Flexion to allow increased ease with overhead reaching  Long Term Goals: To be accomplished in 3-4?? weeks:  1. Improved Foto score to >or=66/100 points for increased ease with all housekeeping involving overhead , lateral and back reaching  2.Improved trunk strength and TS mobility to allow several minutes of overhead activities for return to full function  3. Patient able to perform all ADL with pain of <or= 3/10 ??      PLAN  [ x]?? Upgrade activities as tolerated???????? [ ] ?? Continue plan of care  [ ] ?? Update interventions per flow sheet???????? ??  [ ] ?? Discharge due to:_  [ ] ?? Other:_??       Beatris SiJames D Naeemah Jasmer, PTA 12/01/2014  9:26 AM

## 2014-12-04 ENCOUNTER — Encounter: Payer: TRICARE (CHAMPUS) | Primary: Family Medicine

## 2014-12-07 ENCOUNTER — Inpatient Hospital Stay: Admit: 2014-12-07 | Payer: TRICARE (CHAMPUS) | Primary: Family Medicine

## 2014-12-07 NOTE — Progress Notes (Signed)
PT DAILY TREATMENT NOTE - MCR 8-14    Patient Name: Molly [name]  Date:12/07/2014  DOB: Jun 23, 1956    Patient DOB Verified  Payor: TRICARE / Plan: [facility] [employer]PENDENTS / Product Type: T[employer]are /    In time:340  Out time:430  Total Treatment Time (min): 50  Total Timed Codes (min): 50  1:1 Treatment Time ([place] only): na   Visit #: 5 of 8    Treatment Area: Pain in right shoulder [M25.511]    SUBJECTIVE  Pain Level (0-10 scale): 4  Any medication changes, allergies to medications, adverse drug reactions, diagnosis change, or new procedure performed?:  No     Yes (see summary sheet for update)  Subjective functional status/changes:    No changes reported  Some increased pain today but feels overall improved mobility    OBJECTIVE    Modality rationale: decrease pain and increase tissue extensibility   Min Type Additional Details   15  Estim: Att   Unatt        TENS instruct                  IFC  Premod   NMES                     Other:  w/US   w/ice   w/heat  Position:supine  Location:R sh      Traction:  Cervical       Lumbar                        Prone          Supine                       Intermittent   Continuous Lbs:   before manual   after manual      Ultrasound: Continuous    Pulsed                           1MHz   3MHz Location:  W/cm2:      Iontophoresis with dexamethasone         Location:  Take home patch    In clinic      Ice       heat    Ice massage Position:  Location:      Laser    Other: Position:  Location:      Vasopneumatic Device Pressure:        lo  med  hi   Temperature:  lo  med  hi    Skin assessment post-treatment:  intact redness- no adverse reaction    redness ??? adverse reaction:      min Therapeutic Exercise:   See flow sheet :   Rationale: increase ROM, increase strength and improve coordination    With    TE    TA    neuro    other: Patient Education:  Review HEP     Progressed/Changed HEP based on:    positioning    body mechanics    transfers    heat/ice application      other:      Other Objective/Functional Measures:   Unable to tolerate lying of PR  Pain in ant shoulder and bicep  foto improved to 67/100 form 57/100     Pain Level (0-10 scale) post treatment: 0 (numb)  ASSESSMENT/Changes in Function:   Patient will continue to benefit from skilled PT services to modify and progress therapeutic interventions, address functional mobility deficits, address ROM deficits and address strength deficits to attain remaining goals.       See Plan of Care    See progress note/recertification    See Discharge Summary         Progress towards goals / Updated goals:  Short Term Goals: To be accomplished in 1-2?? weeks:  1. Improved postural awareness to allow self correction for improved shoulder girdle alignment  2. Initiate HEP with good compliance(reports compliance)  3. Improve bilateral shoulder ROM to >or= 140 deg for Flexion to allow increased ease with overhead reaching(improving with TE)  Long Term Goals: To be accomplished in 3-4?? weeks:  1. Improved Foto score to >or=66/100 points for increased ease with all housekeeping involving overhead , lateral and back reaching(met 12/07/14)  2.Improved trunk strength and TS mobility to allow several minutes of overhead activities for return to full function  3. Patient able to perform all ADL with pain of <or= 3/10     PLAN    Upgrade activities as tolerated       Continue plan of care    Update interventions per flow sheet         Discharge due to:_    Other:_      Regino Bellow, PTA 12/07/2014  4:32 PM

## 2014-12-11 ENCOUNTER — Encounter: Payer: TRICARE (CHAMPUS) | Primary: Family Medicine

## 2014-12-12 ENCOUNTER — Inpatient Hospital Stay: Admit: 2014-12-12 | Payer: TRICARE (CHAMPUS) | Primary: Family Medicine

## 2014-12-12 NOTE — Progress Notes (Signed)
PT DAILY TREATMENT NOTE - MCR 8-14    Patient Name: Molly [name]  Date:12/12/2014  DOB: May 24, 1956    Patient DOB Verified  Payor: TRICARE / Plan: [facility] [employer]PENDENTS / Product Type: Tricare /    In time:4  Out time:445  Total Treatment Time (min): 45  Total Timed Codes (min): 45  1:1 Treatment Time ([place] only): na   Visit #: 6 of 8    Treatment Area: Pain in right shoulder [M25.511]    SUBJECTIVE  Pain Level (0-10 scale): 4  Any medication changes, allergies to medications, adverse drug reactions, diagnosis change, or new procedure performed?: [X]  No?????? [ ]  Yes (see summary sheet for update)  Subjective functional status/changes:???? [ ]  No changes reported  Some increased pain today but feels overall improved mobility    OBJECTIVE  ????  Modality rationale:?? decrease pain and increase tissue extensibility??   Min?? Type?? Additional Details??   15?? [X]  Estim: [ ] Att???? [X] Unatt?????????????? [ ] TENS instruct  ?????????????????????????????? [ ] IFC?? [ ] Premod???? [ ] NMES?? ??  ???????????????????????????????? [ ] Other: ?? [ ] w/US???? [ ] w/ice???? [X] w/heat  Position:supine  Location:R sh??     ??35 min?? Therapeutic Exercise:?? [ ]  See flow sheet :??   Rationale: increase ROM, increase strength and improve coordination    MT: STM to R biceps x 10'  ????  With  ??[ ]  TE  ??[ ]  TA  ??[ ]  neuro  ??[ ]  other:?? Patient Education: [X]  Review HEP??   [ ]  Progressed/Changed HEP based on: ??  [ ]  positioning???? [ ]  body mechanics???? [ ]  transfers???? [ ]  heat/ice application??   [ ]  other: ??     Other Objective/Functional Measures:   Unable to tolerate lying of PR  Pain in ant shoulder and bicep  foto improved to 67/100 form 57/100????????????????????    Pain Level (0-10 scale) post treatment: 0 (numb)    ASSESSMENT/Changes in Function: ??  Patient will continue to benefit from skilled PT services to modify and progress therapeutic interventions, address functional mobility deficits, address ROM deficits and address strength deficits to attain remaining goals.  ????  [ ] ?? See Plan of Care   [ ] ?? See progress note/recertification  [ ] ?? See Discharge Summary  ???????? ??  Progress towards goals / Updated goals:  Short Term Goals: To be accomplished in 1-2?? weeks:  1. Improved postural awareness to allow self correction for improved shoulder girdle alignment  2. Initiate HEP with good compliance(reports compliance)  3. Improve bilateral shoulder ROM to >or= 140 deg for Flexion to allow increased ease with overhead reaching(improving with TE)  Long Term Goals: To be accomplished in 3-4?? weeks:  1. Improved Foto score to >or=66/100 points for increased ease with all housekeeping involving overhead , lateral and back reaching(met 12/07/14)  2.Improved trunk strength and TS mobility to allow several minutes of overhead activities for return to full function  3. Patient able to perform all ADL with pain of <or= 3/10     PLAN  [X] ?? Upgrade activities as tolerated???????? [ ] ?? Continue plan of care  [ ] ?? Update interventions per flow sheet???????? ??  [ ] ?? Discharge due to:_  [ ] ?? Other:_??     Regino Bellow, PTA 12/12/2014  6:15 PM

## 2014-12-14 ENCOUNTER — Inpatient Hospital Stay: Admit: 2014-12-14 | Payer: TRICARE (CHAMPUS) | Primary: Family Medicine

## 2014-12-14 NOTE — Progress Notes (Signed)
PT DAILY TREATMENT NOTE - MCR 8-14    Patient Name: Molly [name]  Date:12/14/2014  DOB: 1956-02-01    Patient DOB Verified  Payor: TRICARE / Plan: [facility] [employer]PENDENTS / Product Type: Tricare /    In time:400  Out time:445  Total Treatment Time (min): 45  Total Timed Codes (min): 45  1:1 Treatment Time ([place] only): na   Visit #: 6 of 8    Treatment Area: Pain in right shoulder [M25.511]    SUBJECTIVE  Pain Level (0-10 scale): 2  Any medication changes, allergies to medications, adverse drug reactions, diagnosis change, or new procedure performed?:  No     Yes (see summary sheet for update)  Subjective functional status/changes:    No changes reported  Reports good relief from MT last session. Would like updated HEP today    OBJECTIVE      45 min Therapeutic Exercise:   See flow sheet :   Rationale: increase ROM, increase strength and improve coordination           With    TE    TA    neuro    other: Patient Education:  Review HEP     Progressed/Changed HEP based on: UE stretching and strengthening  positioning    body mechanics    transfers    heat/ice application     other:      Other Objective/Functional Measures:   Deferred MT  Good understanding of updated HEP  No increase of pain today    Pain Level (0-10 scale) post treatment: 1    ASSESSMENT/Changes in Function:     Patient will continue to benefit from skilled PT services to modify and progress therapeutic interventions, address functional mobility deficits, address ROM deficits, address strength deficits and analyze and address soft tissue restrictions to attain remaining goals.       See Plan of Care    See progress note/recertification    See Discharge Summary         Progress towards goals / Updated goals:  Short Term Goals: To be accomplished in 1-2?? weeks:  1. Improved postural awareness to allow self correction for improved shoulder girdle alignment  2. Initiate HEP with good compliance(reports compliance)   3. Improve bilateral shoulder ROM to >or= 140 deg for Flexion to allow increased ease with overhead reaching(improving with TE)  Long Term Goals: To be accomplished in 3-4?? weeks:  1. Improved Foto score to >or=66/100 points for increased ease with all housekeeping involving overhead , lateral and back reaching(met 12/07/14)  2.Improved trunk strength and TS mobility to allow several minutes of overhead activities for return to full function  3. Patient able to perform all ADL with pain of <or= 3/10 (2/10 12/14/14)    PLAN    Upgrade activities as tolerated       Continue plan of care    Update interventions per flow sheet         Discharge due to:_    Other:_      Regino Bellow, PTA 12/14/2014  6:26 PM

## 2014-12-18 ENCOUNTER — Inpatient Hospital Stay: Admit: 2014-12-18 | Payer: TRICARE (CHAMPUS) | Primary: Family Medicine

## 2014-12-18 NOTE — Progress Notes (Signed)
[facility] [place] [address]  Phone: [phone]      Fax:  [phone]    Progress Note  Patient name: Molly [name] Start of Care: 11/13/14   Referral source: Shawnie Pons, MD DOB: 1956-02-06   Medical/Treatment Diagnosis: Pain in right shoulder [M25.511] Onset Date:5/  2015   Prior Hospitalization: see medical history Provider#: [id number]   Medications: Verified on Patient Summary List    Comorbidities: CS, carpal tunnel  Prior Level of Function:Full function without shoulder pain    Visits from Start of Care: 8    Missed Visits: 1      Established Goals:          Excellent Good         Limited           None   Increased ROM            Increased Strength           Increased Mobility            Decreased Pain            Decreased Swelling              Progress towards goals / Updated goals:All STGs met. 2 of 3 LTGs met and deficits include limited R shoulder ROM and strength to perform overhead activities. Also overcompensating with R neck shoulder musculature. Recommend continuation of treatment to address deficits  Short Term Goals: To be accomplished in 1-2?? weeks:  1. Improved postural awareness to allow self correction for improved shoulder girdle alignment(Goal met)  2. Initiate HEP with good compliance(Goal met,reports compliance)  3. Improve bilateral shoulder ROM to >or= 140 deg for Flexion to allow increased ease with overhead reaching(Goal met, R 140 deg, L 160)  Long Term Goals: To be accomplished in 3-4?? weeks:  1. Improved Foto score to >or=66/100 points for increased ease with all housekeeping involving overhead , lateral and back reaching(met 12/07/14)  2.Improved trunk strength and TS mobility to allow several minutes of overhead activities for return to full function  3. Patient able to perform all ADL with pain of <or= 3/10 (2/10 12/14/14)  Updated Goal as of 12/25/14:   4. Ability to perform overhead activities for >or= 2 minutes with active ROM of >or= 140 deg for R shoulder Flex    Key Functional Changes: general mobility and pain  Updated Goals: to be achieved in 4 weeks:   LTG #4  ASSESSMENT/RECOMMENDATIONS:  Continue therapy per initial plan/protocol at a frequency of  2x per week for 4weeks  Continue therapy with the following recommended changes:_____________________      _____________________________________________________________________  Discontinue therapy progressing towards or have reached established goals  Discontinue therapy due to lack of appreciable progress towards goals  Discontinue therapy due to lack of attendance or compliance  Await Physician's recommendations/decisions regarding therapy  Other:________________________________________________________________    Thank you for this referral.   Lorrine Kin, PT 12/25/2014 12:43 PM  NOTE TO PHYSICIAN:  PLEASE COMPLETE THE ORDERS BELOW AND   FAX TO [facility]: [phone]  If you are unable to process this request in 24 hours please contact our office:   [phone]    I have read the above report and request that my patient continue as recommended.    I have read the above report and request that my patient continue therapy with the following changes/special instructions:________________________________________  I  have read the above report and request that my patient be discharged from therapy.    Physician???s signature: ______________________________Date: ______Time:______

## 2014-12-18 NOTE — Progress Notes (Signed)
PT DAILY TREATMENT NOTE - MCR 8-14    Patient Name: Molly [name]  Date:12/18/2014  DOB: [birth year]    Patient DOB Verified  Payor: TRICARE / Plan: [facility] [employer]PENDENTS / Product Type: Tricare /    In time:335  Out time:418  Total Treatment Time (min): 43  Total Timed Codes (min): 43  1:1 Treatment Time ([place] only): na   Visit #:8 of 8    Treatment Area: Pain in right shoulder [M25.511]    SUBJECTIVE  Pain Level (0-10 scale): 2  Any medication changes, allergies to medications, adverse drug reactions, diagnosis change, or new procedure performed?:  No     Yes (see summary sheet for update)  Subjective functional status/changes:    No changes reported  Reports overall improvement. Occasional aching in R shoulder. Mobility improved. Occasional increased pain with certain movements in AM. No night pain.    OBJECTIVE      25 min Therapeutic Exercise:   See flow sheet :   Rationale: increase ROM, increase strength and improve coordination     18 min Reevaluation including update of ex          With    TE    TA    neuro    other: Patient Education:  Review HEP     Progressed/Changed HEP based on: UE stretching and strengthening  positioning    body mechanics    transfers    heat/ice application     other:      Other Objective/Functional Measures:   Supine r shoulder Flex 140, L 160  Pain R shoulder and biceps  Difficulty with overhead reaching      Pain Level (0-10 scale) post treatment: 1    ASSESSMENT/Changes in Function:     Patient will continue to benefit from skilled PT services to modify and progress therapeutic interventions, address functional mobility deficits, address ROM deficits, address strength deficits and analyze and address soft tissue restrictions to attain remaining goals.       See Plan of Care    See progress note/recertification    See Discharge Summary         Progress towards goals / Updated goals:All STGs met. 2 of 3 LTGs met and  deficits include limited R shoulder ROM and strength to perform overhead activities. Also overcompensating with R neck shoulder musculature. Recommend continuation of treatment to address deficits  Short Term Goals: To be accomplished in 1-2?? weeks:  1. Improved postural awareness to allow self correction for improved shoulder girdle alignment(Goal met)  2. Initiate HEP with good compliance(Goal met,reports compliance)  3. Improve bilateral shoulder ROM to >or= 140 deg for Flexion to allow increased ease with overhead reaching(Goal met, R 140 deg, L 160)  Long Term Goals: To be accomplished in 3-4?? weeks:  1. Improved Foto score to >or=66/100 points for increased ease with all housekeeping involving overhead , lateral and back reaching(met 12/07/14)  2.Improved trunk strength and TS mobility to allow several minutes of overhead activities for return to full function  3. Patient able to perform all ADL with pain of <or= 3/10 (2/10 12/14/14)    PLAN    Upgrade activities as tolerated       Continue plan of care for 4 weeks    Update interventions per flow sheet         Discharge due to:_    Other:_      Lorrine Kin, PT 12/18/2014  6:26 PM

## 2014-12-21 ENCOUNTER — Inpatient Hospital Stay: Admit: 2014-12-21 | Payer: TRICARE (CHAMPUS) | Primary: Family Medicine

## 2014-12-21 NOTE — Progress Notes (Signed)
PT DAILY TREATMENT NOTE - MCR 8-14    Patient Name: Molly [name]  Date:12/21/2014  DOB: Aug 28, 1956    Patient DOB Verified  Payor: TRICARE / Plan: [facility] [employer]ES [employer]PENDENTS / Product Type: Tricare /    In time:510 Out time:540  Total Treatment Time (min): 30  Total Timed Codes (min): 30  1:1 Treatment Time ([place] only): na   Visit #: 8 of 8    Treatment Area: Pain in right shoulder [M25.511]    SUBJECTIVE  Pain Level (0-10 scale): 1  Any medication changes, allergies to medications, adverse drug reactions, diagnosis change, or new procedure performed?:  No     Yes (see summary sheet for update)  Subjective functional status/changes:    No changes reported  Reports having some back pain today. May be limited with activity. Will communicate if TE causes extra pain.l    OBJECTIVE    30 min Therapeutic Exercise:   See flow sheet :   Rationale: increase ROM, increase strength and improve coordination          With    TE    TA    neuro    other: Patient Education:  Review HEP     Progressed/Changed HEP based on:    positioning    body mechanics    transfers    heat/ice application     other:      Other Objective/Functional Measures:   No measurements today     Pain Level (0-10 scale) post treatment: 1    ASSESSMENT/Changes in Function:   Progressing well with functional strength and decrease of pain with ADL's    Patient will continue to benefit from skilled PT services to modify and progress therapeutic interventions, address functional mobility deficits, address ROM deficits, address strength deficits and analyze and address soft tissue restrictions to attain remaining goals.       See Plan of Care    See progress note/recertification    See Discharge Summary         Progress towards goals / Updated goals:  . Improved postural awareness to allow self correction for improved shoulder girdle alignment(Goal met)  2. Initiate HEP with good compliance(Goal met,reports compliance)   3. Improve bilateral shoulder ROM to >or= 140 deg for Flexion to allow increased ease with overhead reaching(Goal met, R 140 deg, L 160)  Long Term Goals: To be accomplished in 3-4?? weeks:  1. Improved Foto score to >or=66/100 points for increased ease with all housekeeping involving overhead , lateral and back reaching(met 12/07/14)  2.Improved trunk strength and TS mobility to allow several minutes of overhead activities for return to full function  3. Patient able to perform all ADL with pain of <or= 3/10 (2/10 12/14/14)    PLAN    Upgrade activities as tolerated       Continue plan of care    Update interventions per flow sheet         Discharge due to:_    Other:_      Regino Bellow, PTA 12/21/2014  5:39 PM

## 2014-12-25 ENCOUNTER — Inpatient Hospital Stay: Admit: 2014-12-25 | Payer: TRICARE (CHAMPUS) | Primary: Family Medicine

## 2014-12-25 DIAGNOSIS — M25511 Pain in right shoulder: Secondary | ICD-10-CM

## 2014-12-25 NOTE — Progress Notes (Signed)
PT DAILY TREATMENT NOTE - MCR 8-14    Patient Name: Molly [name]  Date:12/25/2014  DOB: 1956-06-19    Patient DOB Verified  Payor: TRICARE / Plan: [place] DEPENDENTS / Product Type: Tricare /    In time:1105 Out time:1150  Total Treatment Time (min): 45  Total Timed Codes (min): 45  1:1 Treatment Time ([place] only): na   Visit #: 10 of 8 + 8    Treatment Area: Pain in right shoulder [M25.511]    SUBJECTIVE  Pain Level (0-10 scale): 1  Any medication changes, allergies to medications, adverse drug reactions, diagnosis change, or new procedure performed?:  No     Yes (see summary sheet for update)  Subjective functional status/changes:    No changes reported  Mostly pain in R biceps region    OBJECTIVE    15 min Therapeutic Exercise:   See flow sheet :   Rationale: increase ROM, increase strength and improve coordination     15 min therapeutic Activity including updated ex for increased R shoulder overhead ROM/strength, UT inhibition, scapular stability for improved function    15 min Manual Therapy: including GHJ mobs in supine and SL, functional massage to subscapular/pec muscle and UT, functional massage to R biceps, instruction in self massage          With    TE    TA    neuro    other: Patient Education:  Review HEP     Progressed/Changed HEP based on:    positioning    body mechanics    transfers    heat/ice application     other:      Other Objective/Functional Measures:   R shoulder Flex 140 supine and 130 seated  Marked tightness in pec and subscap mm  TTP R biceps     Pain Level (0-10 scale) post treatment: 1, less biceps pain    ASSESSMENT/Changes in Function:   Progressing well with functional strength and decrease of pain with ADL's    Patient will continue to benefit from skilled PT services to modify and progress therapeutic interventions, address functional mobility deficits, address ROM deficits, address strength deficits and analyze and address  soft tissue restrictions to attain remaining goals.       See Plan of Care    See progress note/recertification    See Discharge Summary         Progress towards goals / Updated goals: Patient is showing steady progress. She has met all STGs but continues with deficits in mobility and strength to meet all LTGs. Overall she has met 4 of 6 goals. I recommend continuation of present treatment to address remaining deficits.  1. Improved postural awareness to allow self correction for improved shoulder girdle alignment(Goal met)  2. Initiate HEP with good compliance(Goal met,reports compliance, updated 12/25/14)  3. Improve bilateral shoulder ROM to >or= 140 deg for Flexion to allow increased ease with overhead reaching(Goal met, reached 140 deg in supine R shoulder Flex)  Long Term Goals: To be accomplished in 3-4?? weeks:  1. Improved Foto score to >or=66/100 points for increased ease with all housekeeping involving overhead , lateral and back reaching  (Status at Eval: 57/100)  (met 12/07/14, score regressed to 65/100 as of 12/25/14)  2.Improved trunk strength and TS mobility to allow several minutes of overhead activities for return to full function( Limited functional overhead reaching due to residual strength and mobility deficits)  3. Patient able to perform all ADL  with pain of <or= 3/10   (Status at Eval 2-3/10)  (Status on  12/14/14 2/10)  (Status on 12/25/14 1/10, goal met)    PLAN    Upgrade activities as tolerated       Continue plan of care for 6 more visits    Update interventions per flow sheet         Discharge due to:_    Other:_      Lorrine Kin, PT 12/25/2014  5:39 PM

## 2014-12-28 ENCOUNTER — Encounter: Payer: TRICARE (CHAMPUS) | Primary: Family Medicine

## 2015-01-03 ENCOUNTER — Encounter: Payer: TRICARE (CHAMPUS) | Primary: Family Medicine

## 2015-01-04 ENCOUNTER — Encounter: Payer: TRICARE (CHAMPUS) | Primary: Family Medicine

## 2015-01-08 ENCOUNTER — Encounter: Payer: TRICARE (CHAMPUS) | Primary: Family Medicine

## 2015-01-10 ENCOUNTER — Inpatient Hospital Stay: Admit: 2015-01-10 | Payer: TRICARE (CHAMPUS) | Primary: Family Medicine

## 2015-01-10 NOTE — Progress Notes (Signed)
PT DAILY TREATMENT NOTE - MCR 8-14    Patient Name: Molly [name]  Date:01/10/2015  DOB: 1955/12/11    Patient DOB Verified  Payor: TRICARE / Plan: [facility] [employer]PENDENTS / Product Type: Tricare /    In time:405  Out time:453  Total Treatment Time (min): 48  Total Timed Codes (min): 48  1:1 Treatment Time ([place] only): na   Visit #: 11 of 18    Treatment Area: Pain in right shoulder [M25.511]    SUBJECTIVE  Pain Level (0-10 scale): 0  Any medication changes, allergies to medications, adverse drug reactions, diagnosis change, or new procedure performed?:  No     Yes (see summary sheet for update)  Subjective functional status/changes:    No changes reported      OBJECTIVE    38 min Therapeutic Exercise:   See flow sheet :   Rationale: increase ROM, increase strength and improve coordination     10 min Manual Therapy:  Scapular PROM in L S/L with PNF   Rationale: decrease pain, increase ROM and increase tissue extensibility        With    TE    TA    neuro    other: Patient Education:  Review HEP     Progressed/Changed HEP based on:    positioning    body mechanics    transfers    heat/ice application     other:      Other Objective/Functional Measures:   guarding and stiffness in R Scapula with MT  No pain with TE or MT     Pain Level (0-10 scale) post treatment: 0    ASSESSMENT/Changes in Function:     Patient will continue to benefit from skilled PT services to modify and progress therapeutic interventions, address functional mobility deficits, address ROM deficits and address strength deficits to attain remaining goals.       See Plan of Care    See progress note/recertification    See Discharge Summary         Progress towards goals / Updated goals:  Patient is showing steady progress. She has met all STGs but continues with deficits in mobility and strength to meet all LTGs. Overall she has met 4 of 6 goals. I recommend continuation of present treatment to address remaining deficits.   1. Improved postural awareness to allow self correction for improved shoulder girdle alignment(Goal met)  2. Initiate HEP with good compliance(Goal met,reports compliance, updated 12/25/14)  3. Improve bilateral shoulder ROM to >or= 140 deg for Flexion to allow increased ease with overhead reaching(Goal met, reached 140 deg in supine R shoulder Flex)  Long Term Goals: To be accomplished in 3-4?? weeks:  1. Improved Foto score to >or=66/100 points for increased ease with all housekeeping involving overhead , lateral and back reaching  (Status at Eval: 57/100)  (met 12/07/14, score regressed to 65/100 as of 12/25/14)  2.Improved trunk strength and TS mobility to allow several minutes of overhead activities for return to full function( Limited functional overhead reaching due to residual strength and mobility deficits)  3. Patient able to perform all ADL with pain of <or= 3/10 ??  (Status at Eval 2-3/10)  (Status on?? 12/14/14 2/10)  (Status on 12/25/14 1/10, goal met)    PLAN    Upgrade activities as tolerated       Continue plan of care    Update interventions per flow sheet  Discharge due to:_    Other:_      Regino Bellow, PTA 01/10/2015  4:59 PM

## 2015-01-11 ENCOUNTER — Encounter: Payer: TRICARE (CHAMPUS) | Primary: Family Medicine

## 2015-01-15 ENCOUNTER — Inpatient Hospital Stay: Admit: 2015-01-15 | Payer: TRICARE (CHAMPUS) | Primary: Family Medicine

## 2015-01-15 NOTE — Progress Notes (Signed)
PT DAILY TREATMENT NOTE - MCR 8-14    Patient Name: Molly [name]  Date:01/15/2015  DOB: [birth year]    Patient DOB Verified  Payor: TRICARE / Plan: [facility] [employer]PENDENTS / Product Type: Tricare /    In time:430  Out time:510  Total Treatment Time (min): 40  Total Timed Codes (min): 40  1:1 Treatment Time ([place] only): na   Visit #: 12 of 18    Treatment Area: Pain in right shoulder [M25.511]    SUBJECTIVE  Pain Level (0-10 scale): 2 r shoulder  Any medication changes, allergies to medications, adverse drug reactions, diagnosis change, or new procedure performed?:  No     Yes (see summary sheet for update)  Subjective functional status/changes:    No changes reported  Reports increased stiffness and catch in R biceps when waking up. Overall more stiffness since missing several appointments due to pneumonia      OBJECTIVE    25 min Therapeutic Exercise:   See flow sheet :   Rationale: increase ROM, increase strength and improve coordination     15 min Manual Therapy:  Functional massage R shoulder,R biceps, GHJ mobs in supine and prone for end range D2   Rationale: decrease pain, increase ROM and increase tissue extensibility        With    TE    TA    neuro    other: Patient Education:  Review HEP     Progressed/Changed HEP based on:    positioning    body mechanics    transfers    heat/ice application     other:      Other Objective/Functional Measures:   Pain at end range Flex/ABD  Marked tightness in R pec and subscapular m  TTP R biceps muscle     Pain Level (0-10 scale) post treatment: 1    ASSESSMENT/Changes in Function:     Patient will continue to benefit from skilled PT services to modify and progress therapeutic interventions, address functional mobility deficits, address ROM deficits and address strength deficits to attain remaining goals.       See Plan of Care    See progress note/recertification    See Discharge Summary         Progress towards goals / Updated goals:   Patient is showing steady progress. She has met all STGs but continues with deficits in mobility and strength to meet all LTGs. Overall she has met 4 of 6 goals. I recommend continuation of present treatment to address remaining deficits.  1. Improved postural awareness to allow self correction for improved shoulder girdle alignment(Goal met)  2. Initiate HEP with good compliance(Goal met,reports compliance, updated 12/25/14)  3. Improve bilateral shoulder ROM to >or= 140 deg for Flexion to allow increased ease with overhead reaching(Goal met, reached 140 deg in supine R shoulder Flex)  Long Term Goals: To be accomplished in 3-4?? weeks:  1. Improved Foto score to >or=66/100 points for increased ease with all housekeeping involving overhead , lateral and back reaching  (Status at Eval: 57/100)  (met 12/07/14, score regressed to 65/100 as of 12/25/14)  2.Improved trunk strength and TS mobility to allow several minutes of overhead activities for return to full function( Limited functional overhead reaching due to residual strength and mobility deficits)  3. Patient able to perform all ADL with pain of <or= 3/10 ??  (Status at Eval 2-3/10)  (Status on?? 12/14/14 2/10)  (Status on 12/25/14 1/10, goal met)  PLAN    Upgrade activities as tolerated       Continue plan of care    Update interventions per flow sheet         Discharge due to:_    Other:_      Lorrine Kin, PT 01/15/2015  4:59 PM

## 2015-01-17 ENCOUNTER — Encounter: Payer: TRICARE (CHAMPUS) | Primary: Family Medicine

## 2015-01-18 ENCOUNTER — Inpatient Hospital Stay: Admit: 2015-01-18 | Payer: TRICARE (CHAMPUS) | Primary: Family Medicine

## 2015-01-18 NOTE — Progress Notes (Signed)
PT DAILY TREATMENT NOTE - MCR 8-14    Patient Name: Molly [name]  Date:01/18/2015  DOB: [birth year]    Patient DOB Verified  Payor: TRICARE / Plan: [facility] [employer]PENDENTS / Product Type: Tricare /    In time:430  Out time:510  Total Treatment Time (min): 40  Total Timed Codes (min): 40  1:1 Treatment Time ([place] only): na   Visit #: 13 of 18    Treatment Area: Pain in right shoulder [M25.511]    SUBJECTIVE  Pain Level (0-10 scale): 0  Any medication changes, allergies to medications, adverse drug reactions, diagnosis change, or new procedure performed?:  No     Yes (see summary sheet for update)  Subjective functional status/changes:    No changes reported  Min soreness    OBJECTIVE    30 min Therapeutic Exercise:   See flow sheet :   Rationale: increase ROM, increase strength and improve coordination      10' MT functional massage to R biceps and pec in supine          With    TE    TA    neuro    other: Patient Education:  Review HEP     Progressed/Changed HEP based on:    positioning    body mechanics    transfers    heat/ice application     other:      Other Objective/Functional Measures:    Pain at end range Flex/ABD persist  Marked tightness in R pec and subscapular m  TTP R biceps muscle decreased????????????    Pain Level (0-10 scale) post treatment: 0    ASSESSMENT/Changes in Function:   Patient will continue to benefit from skilled PT services to modify and progress therapeutic interventions, address functional mobility deficits, address ROM deficits and address strength deficits to attain remaining goals.       See Plan of Care    See progress note/recertification    See Discharge Summary         Progress towards goals / Updated goals:  Patient is showing steady progress. She has met all STGs but continues with deficits in mobility and strength to meet all LTGs. Overall she has met 4 of 6 goals. I recommend continuation of present treatment to address remaining deficits.   1. Improved postural awareness to allow self correction for improved shoulder girdle alignment(Goal met)  2. Initiate HEP with good compliance(Goal met,reports compliance, updated 12/25/14)  3. Improve bilateral shoulder ROM to >or= 140 deg for Flexion to allow increased ease with overhead reaching(Goal met, reached 140 deg in supine R shoulder Flex)  Long Term Goals: To be accomplished in 3-4?? weeks:  1. Improved Foto score to >or=66/100 points for increased ease with all housekeeping involving overhead , lateral and back reaching  (Status at Eval: 57/100)  (met 12/07/14, score regressed to 65/100 as of 12/25/14)  2.Improved trunk strength and TS mobility to allow several minutes of overhead activities for return to full function( Limited functional overhead reaching due to residual strength and mobility deficits)  3. Patient able to perform all ADL with pain of <or= 3/10 ??  (Status at Eval 2-3/10)  (Status on?? 12/14/14 2/10)  (Status on 12/25/14 1/10, goal met)    PLAN    Upgrade activities as tolerated       Continue plan of care    Update interventions per flow sheet         Discharge due to:_  Other:_      Regino Bellow, PTA 01/18/2015  6:13 PM

## 2015-01-23 ENCOUNTER — Inpatient Hospital Stay: Admit: 2015-01-23 | Payer: TRICARE (CHAMPUS) | Primary: Family Medicine

## 2015-01-23 DIAGNOSIS — M25511 Pain in right shoulder: Secondary | ICD-10-CM

## 2015-01-23 NOTE — Progress Notes (Signed)
PT DAILY TREATMENT NOTE - MCR 3-16    Patient Name: Molly [name]  Date:01/23/2015  DOB: 05-22-1956    Patient DOB Verified  Payor: TRICARE / Plan: [facility] [employer]ES [employer]PENDENTS / Product Type: Tricare /    In time:330 Out time:40  Total Treatment Time (min): 30  Total Timed Codes (min): 30  1:1 Treatment Time ([place] only): na   Visit #: 14 of 18    Treatment Area: Pain in right shoulder [M25.511]    SUBJECTIVE  Pain Level (0-10 scale): 0  Any medication changes, allergies to medications, adverse drug reactions, diagnosis change, or new procedure performed?: [X]  No?????? [ ]  Yes (see summary sheet for update)  Subjective functional status/changes:???? [X]  No changes reported   OBJECTIVE  No symptoms today  ????  30?? min?? Therapeutic Exercise:?? [ ]  See flow sheet :??   Rationale: increase ROM, increase strength and improve coordination   ???????????????????????????????????????????????????????????????????????????????????????????????????????????????????????? ??  With  ??[ ]  TE  ??[ ]  TA  ??[ ]  neuro  ??[ ]  other:?? Patient Education: [X]  Review HEP??   [ ]  Progressed/Changed HEP based on: ??  [ ]  positioning???? [ ]  body mechanics???? [ ]  transfers???? [ ]  heat/ice application??   [ ]  other: ??     Other Objective/Functional Measures: ??????????  Pain at end range Flex/ABD persist  Marked tightness in R pec and subscapular m  TTP R biceps muscle decreased????????  No increased pain with advanced TE????    Pain Level (0-10 scale) post treatment: 0    ASSESSMENT/Changes in Function: ??  Patient will continue to benefit from skilled PT services to modify and progress therapeutic interventions, address functional mobility deficits, address ROM deficits and address strength deficits to attain remaining goals.  ????  [ ] ?? See Plan of Care  [ ] ?? See progress note/recertification  [ ] ?? See Discharge Summary  ???????? ??  Progress towards goals / Updated goals:  Patient is showing steady progress. She has met all STGs but continues with deficits in mobility and strength to meet all LTGs. Overall she has  met 4 of 6 goals. I recommend continuation of present treatment to address remaining deficits.  1. Improved postural awareness to allow self correction for improved shoulder girdle alignment(Goal met)  2. Initiate HEP with good compliance(Goal met,reports compliance, updated 12/25/14)  3. Improve bilateral shoulder ROM to >or= 140 deg for Flexion to allow increased ease with overhead reaching(Goal met, reached 140 deg in supine R shoulder Flex)  Long Term Goals: To be accomplished in 3-4?? weeks:  1. Improved Foto score to >or=66/100 points for increased ease with all housekeeping involving overhead , lateral and back reaching  (Status at Eval: 57/100)  (met 12/07/14, score regressed to 65/100 as of 12/25/14)  2.Improved trunk strength and TS mobility to allow several minutes of overhead activities for return to full function( Limited functional overhead reaching due to residual strength and mobility deficits)  3. Patient able to perform all ADL with pain of <or= 3/10 ??  (Status at Eval 2-3/10)  (Status on?? 12/14/14 2/10)  (Status on 12/25/14 1/10, goal met)    PLAN  [X] ?? Upgrade activities as tolerated???????? [X] ?? Continue plan of care  [ ] ?? Update interventions per flow sheet???????? ??  [ ] ?? Discharge due to:_  [ ] ?? Other:_??           Regino Bellow, PTA 01/23/2015  6:11 PM

## 2015-01-25 ENCOUNTER — Inpatient Hospital Stay: Admit: 2015-01-25 | Payer: TRICARE (CHAMPUS) | Primary: Family Medicine

## 2015-01-25 NOTE — Progress Notes (Signed)
PT DAILY TREATMENT NOTE - MCR 3-16    Patient Name: Molly [name]  Date:01/25/2015  DOB: May 23, 1956    Patient DOB Verified  Payor: TRICARE / Plan: [facility] [employer]PENDENTS / Product Type: Tricare /    In time:430  Out time:500  Total Treatment Time (min): 30  Total Timed Codes (min): 30  1:1 Treatment Time ([place] only): na   Visit #: 15 of 18    Treatment Area: Pain in right shoulder [M25.511]    SUBJECTIVE  Pain Level (0-10 scale): 0  Any medication changes, allergies to medications, adverse drug reactions, diagnosis change, or new procedure performed?:  No     Yes (see summary sheet for update)  Subjective functional status/changes:    No changes reported  Reports some biceps tightness in the morning, pt advised to try STM before and after sleep.    OBJECTIVE      30 min Therapeutic Exercise:   See flow sheet :   Rationale: increase ROM, increase strength and improve coordination           With    TE    TA    neuro    other: Patient Education:  Review HEP     Progressed/Changed HEP based on:    positioning    body mechanics    transfers    heat/ice application     other:      Other Objective/Functional Measures:      Pain Level (0-10 scale) post treatment: 0    ASSESSMENT/Changes in Function:   Pt continues to show improved form and coordination with advanced exercises with no reproduction of symptoms.    Patient will continue to benefit from skilled PT services to modify and progress therapeutic interventions, address functional mobility deficits, address ROM deficits and address strength deficits to attain remaining goals.       See Plan of Care    See progress note/recertification    See Discharge Summary         Progress towards goals / Updated goals:  Patient is showing steady progress. She has met all STGs but continues with deficits in mobility and strength to meet all LTGs. Overall she has met 4 of 6 goals. I recommend continuation of present treatment to address remaining deficits.   1. Improved postural awareness to allow self correction for improved shoulder girdle alignment(Goal met)  2. Initiate HEP with good compliance(Goal met,reports compliance, updated 12/25/14)  3. Improve bilateral shoulder ROM to >or= 140 deg for Flexion to allow increased ease with overhead reaching(Goal met, reached 140 deg in supine R shoulder Flex)  Long Term Goals: To be accomplished in 3-4?? weeks:  1. Improved Foto score to >or=66/100 points for increased ease with all housekeeping involving overhead , lateral and back reaching  (Status at Eval: 57/100)  (met 12/07/14, score regressed to 65/100 as of 12/25/14)  2.Improved trunk strength and TS mobility to allow several minutes of overhead activities for return to full function( Limited functional overhead reaching due to residual strength and mobility deficits)  3. Patient able to perform all ADL with pain of <or= 3/10 ??  (Status at Eval 2-3/10)  (Status on?? 12/14/14 2/10)  (Status on 12/25/14 1/10, goal met)      PLAN    Upgrade activities as tolerated       Continue plan of care    Update interventions per flow sheet         Discharge due to:_  Other:_      Regino Bellow, PTA 01/25/2015  5:10 PM

## 2015-01-29 ENCOUNTER — Inpatient Hospital Stay: Admit: 2015-01-29 | Payer: TRICARE (CHAMPUS) | Primary: Family Medicine

## 2015-01-29 NOTE — Progress Notes (Signed)
[facility] [place] [address]  Phone: [phone]      Fax:  [phone]    Discharge Summary      Patient name: Molly [name]     Start of Care: 12/21//15  Referral source: Shawnie Pons, MD    DOB: July 01, 1956  Medical/Treatment Diagnosis: Pain in right shoulder [M25.511]  Onset Date:03/2014  Prior Hospitalization: see medical history   Provider#: [id number]  Comorbidities: CS, carpal tunnel  Prior Level of Function:Full function without shoulder pain  Medications: Verified on Patient Summary List    Visits from Start of Care: 16    Missed Visits: 3  Reporting Period : 12/18/14 to 01/29/15      Progress towards goals / Updated goals:  Patient has shown excellent progress in mobility, strength and function. She has met all goals. I recommend discharge to HEP at this time.  1. Improved postural awareness to allow self correction for improved shoulder girdle alignment(Goal met)  2. Initiate HEP with good compliance(Goal met,reports compliance, updated 12/25/14)  3. Improve bilateral shoulder ROM to >or= 140 deg for Flexion to allow increased ease with overhead reaching(Goal met, reached 140 deg in supine R shoulder Flex)  Long Term Goals: To be accomplished in 3-4?? weeks:  1. Improved Foto score to >or=66/100 points for increased ease with all housekeeping involving overhead , lateral and back reaching  (Status at Eval: 57/100)  Status on 01/25/15, foto 59/100, progressing)  (Status at D/C 75/100))  2.Improved trunk strength and TS mobility to allow several minutes of overhead activities for return to full function  (Able to perform task, goal met)  3. Patient able to perform all ADL with pain of <or= 3/10 ??  (Status at Eval 2-3/10)  (Status on?? 12/14/14 2/10)  (Status on 12/25/14 1/10, goal met)      Assessment/ Summary of Care: All goals met    RECOMMENDATIONS:  Discontinue therapy: Patient has reached or is progressing toward set goals       Patient is non-compliant or has abdicated      Due to lack of appreciable progress towards set goals    Lorrine Kin, PT 01/29/2015 7:27 PM

## 2015-01-29 NOTE — Progress Notes (Signed)
PT DAILY TREATMENT NOTE - MCR 3-16    Patient Name: Molly [name]  Date:01/29/2015  DOB: 03-28-1956    Patient DOB Verified  Payor: TRICARE / Plan: [facility] [employer]PENDENTS / Product Type: T[employer]are /    In time:330  Out time:4  Total Treatment Time (min): 30  Total Timed Codes (min): 30  1:1 Treatment Time ([place] only): na   Visit #: 16 of 18    Treatment Area: Pain in right shoulder [M25.511]    SUBJECTIVE  Pain Level (0-10 scale): 0  Any medication changes, allergies to medications, adverse drug reactions, diagnosis change, or new procedure performed?:  No     Yes (see summary sheet for update)  Subjective functional status/changes:    No changes reported  Feeling much better . Only occasional mild shoulder pain.      OBJECTIVE      20 min Therapeutic Exercise:   See flow sheet :Review of advanced HeP   Rationale: increase ROM, increase strength and improve coordination     10 min reevaluation: function, mobility, strength          With    TE    TA    neuro    other: Patient Education:  Review HEP     Progressed/Changed HEP based on:    positioning    body mechanics    transfers    heat/ice application     other:      Other Objective/Functional Measures:   Foto 75  Shoulder ROM WNL  Reviewed deficits mentioned in Foto report from 01/25/15 including overhead work, lift to shoulder level up to 5#- no difficulty       Pain Level (0-10 scale) post treatment: 0    ASSESSMENT/Changes in Function:   Pt continues to show improved form and coordination with advanced exercises with no reproduction of symptoms.           See Plan of Care    See progress note/recertification    See Discharge Summary         Progress towards goals / Updated goals:  Patient has shown excellent progress in mobility, strength and function. She has met all goals. I recommend discharge to HEP at this time.  1. Improved postural awareness to allow self correction for improved shoulder girdle alignment(Goal met)   2. Initiate HEP with good compliance(Goal met,reports compliance, updated 12/25/14)  3. Improve bilateral shoulder ROM to >or= 140 deg for Flexion to allow increased ease with overhead reaching(Goal met, reached 140 deg in supine R shoulder Flex)  Long Term Goals: To be accomplished in 3-4?? weeks:  1. Improved Foto score to >or=66/100 points for increased ease with all housekeeping involving overhead , lateral and back reaching  (Status at Eval: 57/100)  Status on 01/25/15, foto 59/100, progressing)  (Status at D/C 75/100))  2.Improved trunk strength and TS mobility to allow several minutes of overhead activities for return to full function  (Able to perform task, goal met)  3. Patient able to perform all ADL with pain of <or= 3/10 ??  (Status at Eval 2-3/10)  (Status on?? 12/14/14 2/10)  (Status on 12/25/14 1/10, goal met)      PLAN       Discharge due HB:ZJIRC being met_    Other:_      Lorrine Kin, PT 01/29/2015  5:10 PM

## 2015-02-01 ENCOUNTER — Encounter: Payer: TRICARE (CHAMPUS) | Primary: Family Medicine

## 2015-03-12 ENCOUNTER — Encounter

## 2015-03-14 ENCOUNTER — Inpatient Hospital Stay: Admit: 2015-03-14 | Payer: TRICARE (CHAMPUS) | Attending: Podiatrist | Primary: Family Medicine

## 2015-03-14 DIAGNOSIS — E1151 Type 2 diabetes mellitus with diabetic peripheral angiopathy without gangrene: Secondary | ICD-10-CM

## 2015-03-14 NOTE — Procedures (Signed)
[facility]  *** FINAL REPORT ***    Name: [name], Molly  MRN: [id number]    Outpatient  DOB: 24 Dec 1955  HIS Order #: [id number]  TRAKnet Visit #: [id number]  Date: 14 Mar 2015    TYPE OF TEST: Peripheral Arterial Testing    REASON FOR TEST  Claudication    Right Leg  Segmentals: Normal                     mmHg  Brachial         163  High thigh  Low thigh  Calf  Posterior tibial 177  Dorsalis pedis   175  Peroneal  Metatarsal  Toe pressure  Doppler:    Normal  Ankle/Brachial: 1.09    Left Leg  Segmentals: Normal                     mmHg  Brachial         157  High thigh  Low thigh  Calf  Posterior tibial 177  Dorsalis pedis   170  Peroneal  Metatarsal  Toe pressure  Doppler:    Normal  Ankle/Brachial: 1.09    INTERPRETATION/FINDINGS  The physiologic exam was performed with continuous wave, pulsed wave  doppler and ankle pressures.  1. No evidence of significant peripheral arterial disease at rest in  the right leg.  The right ankle/brachial index is 1.09 and the  toe/brachial index is 0.68  2. No evidence of significant peripheral arterial disease at rest in  the left leg. The left ankle/brachial index is 1.09 and the  toe/brachial index is 0.77    ADDITIONAL COMMENTS    I have personally reviewed the data relevant to the interpretation of  this  study.    TECHNOLOGIST: Fatima Latif  Signed: 03/14/2015 11:43 AM    PHYSICIAN: Adanely Reynoso Hyong Ryoma Nofziger MD  Signed: 03/14/2015 03:18 PM

## 2015-03-14 NOTE — Procedures (Signed)
[facility]  *** FINAL [facility]PORT ***    Name: Molly LotSTURDIFEN, [name]  MRN: [id number]    Outpatient  DOB: 24 Dec 1955  HIS Order #: [id number]  TRAKnet Visit #: [id number]  Date: 14 Mar 2015    TYPE OF TEST: Peripheral Arterial Testing    REASON FOR TEST  Claudication    Right Leg  Segmentals: Normal                     mmHg  Brachial         163  High thigh  Low thigh  Calf  Posterior tibial 177  Dorsalis pedis   175  Peroneal  Metatarsal  Toe pressure  Doppler:    Normal  Ankle/Brachial: 1.09    Left Leg  Segmentals: Normal                     mmHg  Brachial         157  High thigh  Low thigh  Calf  Posterior tibial 177  Dorsalis pedis   170  Peroneal  Metatarsal  Toe pressure  Doppler:    Normal  Ankle/Brachial: 1.09    INTERPRETATION/FINDINGS  The physiologic exam was performed with continuous wave, pulsed wave  doppler and ankle pressures.  1. No evidence of significant peripheral arterial disease at rest in  the right leg.  The right ankle/brachial index is 1.09 and the  toe/brachial index is 0.68  2. No evidence of significant peripheral arterial disease at rest in  the left leg. The left ankle/brachial index is 1.09 and the  toe/brachial index is 0.77    ADDITIONAL COMMENTS    I have personally reviewed the data relevant to the interpretation of  this  study.    TECHNOLOGIST: Adriana SimasFatima Latif  Signed: 03/14/2015 11:43 AM    PHYSICIAN: Macall Mccroskey Wynonia MustyHyong Malayzia Laforte MD  Signed: 03/14/2015 03:18 PM

## 2015-05-05 ENCOUNTER — Inpatient Hospital Stay
Admit: 2015-05-05 | Discharge: 2015-05-05 | Disposition: A | Discharge status: Home / Self Care | Payer: TRICARE (CHAMPUS) | Attending: Internal Medicine

## 2015-05-05 ENCOUNTER — Emergency Department: Admit: 2015-05-05 | Payer: TRICARE (CHAMPUS) | Primary: Family Medicine

## 2015-05-05 DIAGNOSIS — I10 Essential (primary) hypertension: Secondary | ICD-10-CM

## 2015-05-05 LAB — METABOLIC PANEL, COMPREHENSIVE
A-G Ratio: 0.9 (ref 0.8–1.7)
ALT (SGPT): 30 U/L (ref 13–56)
AST (SGOT): 24 U/L (ref 15–37)
Albumin: 3.8 g/dL (ref 3.4–5.0)
Alk. phosphatase: 117 U/L (ref 45–117)
Anion gap: 13 mmol/L (ref 3.0–18)
BUN/Creatinine ratio: 21 — ABNORMAL HIGH (ref 12–20)
BUN: 18 MG/DL (ref 7.0–18)
Bilirubin, total: 0.4 MG/DL (ref 0.2–1.0)
CO2: 28 mmol/L (ref 21–32)
Calcium: 9.2 MG/DL (ref 8.5–10.1)
Chloride: 99 mmol/L — ABNORMAL LOW (ref 100–108)
Creatinine: 0.86 MG/DL (ref 0.6–1.3)
GFR est AA: >60 mL/min/{1.73_m2} (ref >60)
GFR est non-AA: >60 mL/min/{1.73_m2} (ref >60)
Globulin: 4.1 g/dL — ABNORMAL HIGH (ref 2.0–4.0)
Glucose: 95 mg/dL (ref 74–99)
Potassium: 3.3 mmol/L — ABNORMAL LOW (ref 3.5–5.5)
Protein, total: 7.9 g/dL (ref 6.4–8.2)
Sodium: 140 mmol/L (ref 136–145)

## 2015-05-05 LAB — CBC WITH AUTOMATED DIFF
ABS. BASOPHILS: 0.1 10*3/uL — ABNORMAL HIGH (ref 0.0–0.06)
ABS. EOSINOPHILS: 0.1 10*3/uL (ref 0.0–0.4)
ABS. LYMPHOCYTES: 2.6 10*3/uL (ref 0.9–3.6)
ABS. MONOCYTES: 0.5 10*3/uL (ref 0.05–1.2)
ABS. NEUTROPHILS: 1.6 10*3/uL — ABNORMAL LOW (ref 1.8–8.0)
BASOPHILS: 1 % (ref 0–2)
EOSINOPHILS: 1 % (ref 0–5)
HCT: 41.8 % (ref 35.0–45.0)
HGB: 13 g/dL (ref 12.0–16.0)
LYMPHOCYTES: 55 % — ABNORMAL HIGH (ref 21–52)
MCH: 24.3 PG (ref 24.0–34.0)
MCHC: 31.1 g/dL (ref 31.0–37.0)
MCV: 78 FL (ref 74.0–97.0)
MONOCYTES: 11 % — ABNORMAL HIGH (ref 3–10)
MPV: 11.7 FL (ref 9.2–11.8)
NEUTROPHILS: 32 % — ABNORMAL LOW (ref 40–73)
PLATELET COMMENTS: ADEQUATE
PLATELET: 215 10*3/uL (ref 135–420)
RBC: 5.36 M/uL — ABNORMAL HIGH (ref 4.20–5.30)
RDW: 16.3 % — ABNORMAL HIGH (ref 11.6–14.5)
WBC: 4.9 10*3/uL (ref 4.6–13.2)

## 2015-05-05 LAB — CARDIAC PANEL,(CK, CKMB & TROPONIN)
CK - MB: 3.5 ng/ml (ref 0.5–3.6)
CK-MB Index: 1.1 % (ref 0.0–4.0)
CK: 324 U/L — ABNORMAL HIGH (ref 26–192)
Troponin-I, QT: <0.02 NG/ML (ref 0.00–0.06)

## 2015-05-05 LAB — URINALYSIS W/ RFLX MICROSCOPIC
Bilirubin: NEGATIVE
Blood: NEGATIVE
Glucose: NEGATIVE mg/dL
Ketone: NEGATIVE mg/dL
Leukocyte Esterase: NEGATIVE
Nitrites: NEGATIVE
Protein: NEGATIVE mg/dL
Specific gravity: 1.005 (ref 1.005–1.030)
Urobilinogen: 0.2 EU/dL (ref 0.2–1.0)
pH (UA): 5 (ref 5.0–8.0)

## 2015-05-05 LAB — MAGNESIUM: Magnesium: 1.8 mg/dL (ref 1.8–2.4)

## 2015-05-05 LAB — GLUCOSE, POC: Glucose (POC): 87 mg/dL (ref 70–110)

## 2015-05-05 MED ORDER — GI COCKTAIL (MIH CMPD)
Freq: Four times a day (QID) | ORAL | Status: DC
Start: 2015-05-05 — End: 2015-05-05
  Administered 2015-05-05: via ORAL

## 2015-05-05 MED ORDER — POTASSIUM CHLORIDE 20 MEQ ORAL PACKET FOR SOLUTION
20 mEq | Freq: Two times a day (BID) | ORAL | Status: DC
Start: 2015-05-05 — End: 2015-05-05
  Administered 2015-05-05: via ORAL

## 2015-05-05 MED ORDER — FAMOTIDINE 20 MG TAB
20 mg | ORAL_TABLET | Freq: Two times a day (BID) | ORAL | Status: AC
Start: 2015-05-05 — End: 2015-05-15

## 2015-05-05 MED ORDER — POTASSIUM CHLORIDE SR 20 MEQ TAB, PARTICLES/CRYSTALS
20 mEq | ORAL_TABLET | Freq: Every day | ORAL | Status: AC
Start: 2015-05-05 — End: 2015-05-08

## 2015-05-05 MED ORDER — FAMOTIDINE 20 MG TAB
20 mg | ORAL_TABLET | Freq: Two times a day (BID) | ORAL | Status: DC
Start: 2015-05-05 — End: 2015-05-05

## 2015-05-05 MED FILL — KLOR-CON 20 MEQ ORAL PACKET: 20 mEq | ORAL | Qty: 1

## 2015-05-05 MED FILL — GI COCKTAIL (MIH CMPD): ORAL | Qty: 30

## 2015-05-05 NOTE — ED Notes (Signed)
C/o elevated BP for last several days despite normal use of BP medications. Pt also states having some lightheadedness, HA, occasional CP.      Sepsis Screening completed    (  )Patient meets SIRS criteria.  ( x )Patient does not meet SIRS criteria.      SIRS Criteria is achieved when two or more of the following are present  ? Temperature < 96.8??F (36??C) or > 100.9??F (38.3??C)  ? Heart Rate > 90 beats per minute  ? Respiratory Rate > 20 beats per minute  ? WBC count > 12,000 or <4,000 or > 10% bands      (  )Patient has a suspected source of infection.  ( x )Patient does not have a suspected source of infection.

## 2015-05-05 NOTE — ED Notes (Signed)
Pt hourly rounding competed.  Safety   Pt (x) resting on stretcher with side rails up and call bell in reach.    () in chair    () in parents arms.  Toileting   Pt offered ()Bedpan     (x)Assistance to Restroom     ()Urinal  Ongoing Updates  Updated on plan of care and status of test results.  Pain Management  Inquired as to comfort and offered comfort measures:    (x) warm blankets   () dimmed lights

## 2015-05-05 NOTE — ED Provider Notes (Signed)
HPI Comments: 4:12 PM  Molly [name] is a 59 y.o. female with h/o HTN, HLD, DM, and hypothyroid presenting to the ED C/O uncontrolled HTN onset 2 days ago. Associated sxs include leg swelling, headache (5/10), chest discomfort described as reflux onset after eating yogurt, weakness in her bilateral lower extremities, and lightheadedness. She states it measured 196/106 earlier today. Pt states she has a h/o numbness and tingling to LT hand. Pt had cardiac stress test earlier this year which was negative. Daily medications include HCTZ which she started 2 days ago, Adalat, Lopressor, and Metformin. Family hx includes HTN. Surgical hx includes cholecystectomy and partial hysterectomy. Pt denies SOB, chest pain, syncope, back pain, incontinence, urinary sxs, constipation, dysuria, bleeding, denies pain medication for HA, fever, chills, congestion, sore throat, illicit drug use, EtOH use, tobacco use, h/o Lupus, new or worsening stressors, blood thinners, treatment for hypothyroidism, and any other symptoms or complaints at this time.     Patient is a 59 y.o. female presenting with hypertension. The history is provided by the patient. No language interpreter was used.   Hypertension   This is a chronic problem. The current episode started 2 days ago. Associated symptoms include chest pain (discomfort described as reflux) and headaches. Pertinent negatives include no shortness of breath, no nausea and no vomiting. Risk factors include family history, diabetes mellitus, dyslipidemia and hypertension.        Past Medical History:   Diagnosis Date   ??? Diabetes ([place])    ??? Hypertension    ??? Palpitations    ??? Hyperlipidemia    ??? Thyroid disease      nodules   ??? Pneumonia    ??? Bronchitis    ??? Asthma        Past Surgical History:   Procedure Laterality Date   ??? Hx hysterectomy     ??? Pr abdomen surgery proc unlisted     ??? Hx cholecystectomy           Family History:   Problem Relation Age of Onset   ??? Arrhythmia Mother     ??? Heart Attack Mother    ??? Coronary Artery Disease Maternal Uncle    ??? Heart Attack Maternal Uncle        History     Social History   ??? Marital Status: MARRIED     Spouse Name: N/A   ??? Number of Children: N/A   ??? Years of Education: N/A     Occupational History   ??? Not on file.     Social History Main Topics   ??? Smoking status: Never Smoker    ??? Smokeless tobacco: Never Used   ??? Alcohol Use: No   ??? Drug Use: Not on file   ??? Sexual Activity: Not on file     Other Topics Concern   ??? Not on file     Social History Narrative         ALLERGIES: [employer]    Review of Systems   Constitutional: Negative for fever and chills.   HENT: Negative for congestion and sore throat.    Respiratory: Negative for shortness of breath.    Cardiovascular: Positive for chest pain (discomfort described as reflux) and leg swelling.   Gastrointestinal: Negative for nausea, vomiting and constipation.   Genitourinary: Negative.    Musculoskeletal: Negative for back pain.   Neurological: Positive for weakness (lower extremity bilateral transient), light-headedness, numbness (LT hand, not new) and headaches. Negative for syncope.  All other systems reviewed and are negative.      Filed Vitals:    05/05/15 1745 05/05/15 1809 05/05/15 1815 05/05/15 1830   BP: 155/84 177/78 152/77 160/76   Pulse: 60 63 62 61   Temp:       Resp: 16 12 19 12    Height:       Weight:       SpO2: 100% 97% 99% 98%            Physical Exam   Constitutional: She is oriented to person, place, and time. She appears well-developed and well-nourished.   Hypertensive.   HENT:   Head: Normocephalic and atraumatic.   Right Ear: External ear normal.   Left Ear: External ear normal.   Nose: Nose normal.   Mouth/Throat: Oropharynx is clear and moist.   Eyes: Conjunctivae and EOM are normal. Pupils are equal, round, and reactive to light. Right eye exhibits no discharge. Left eye exhibits no discharge. No scleral icterus.    Neck: Normal range of motion. Neck supple. No JVD present. No tracheal deviation present.   Cardiovascular: Normal rate, regular rhythm, normal heart sounds, intact distal pulses and normal pulses.  Exam reveals no decreased pulses.    Pulmonary/Chest: Effort normal and breath sounds normal.   Abdominal: Soft. Bowel sounds are normal. She exhibits no distension and no mass. There is no tenderness.   Multiple healed surgical scars. No HSM   Musculoskeletal: Normal range of motion.        Right lower leg: She exhibits edema ([place]).        Left lower leg: She exhibits edema ([place]).   Neurological: She is alert and oriented to person, place, and time. She has normal reflexes. No cranial nerve deficit. She exhibits normal muscle tone. Coordination normal.   No focal motor weakness.   Skin: Skin is warm and dry. No rash noted.   Psychiatric: She has a normal mood and affect. Her behavior is normal.   Nursing note and vitals reviewed.       RESULTS:    EKG interpretation: (Preliminary)  NSR; rate is 65 bpm. LVH vs normal variant.  EKG read by Robbie Lis, MD at 16:14    XR CHEST PORT   Final Result   Impression:  Minimal linear scarring or atelectasis as above without acute radiographic  cardiopulmonary abnormality.  As read by the radiologist.         Labs Reviewed   CBC WITH AUTOMATED DIFF - Abnormal; Notable for the following:     RBC 5.36 (*)     RDW 16.3 (*)     NEUTROPHILS 32 (*)     LYMPHOCYTES 55 (*)     MONOCYTES 11 (*)     ABS. NEUTROPHILS 1.6 (*)     ABS. BASOPHILS 0.1 (*)     All other components within normal limits   METABOLIC PANEL, COMPREHENSIVE - Abnormal; Notable for the following:     Potassium 3.3 (*)     Chloride 99 (*)     BUN/Creatinine ratio 21 (*)     Globulin 4.1 (*)     All other components within normal limits   CARDIAC PANEL,(CK, CKMB & TROPONIN) - Abnormal; Notable for the following:     CK 324 (*)     All other components within normal limits   MAGNESIUM   TSH 3RD GENERATION    URINALYSIS W/ RFLX MICROSCOPIC       Recent Results (  from the past 12 hour(s))   EKG, 12 LEAD, INITIAL    Collection Time: 05/05/15  4:14 PM   Result Value Ref Range    Ventricular Rate 65 BPM    Atrial Rate 65 BPM    P-R Interval 174 ms    QRS Duration 80 ms    Q-T Interval 424 ms    QTC Calculation (Bezet) 440 ms    Calculated P Axis 33 degrees    Calculated R Axis 20 degrees    Calculated T Axis 2 degrees    Diagnosis       Normal sinus rhythm  Minimal voltage criteria for LVH, may be normal variant  Borderline ECG  When compared with ECG of 23-Nov-2013 15:32,  No significant change was found     CBC WITH AUTOMATED DIFF    Collection Time: 05/05/15  4:35 PM   Result Value Ref Range    WBC 4.9 4.6 - 13.2 K/uL    RBC 5.36 (H) 4.20 - 5.30 M/uL    HGB 13.0 12.0 - 16.0 g/dL    HCT 41.8 35.0 - 45.0 %    MCV 78.0 74.0 - 97.0 FL    MCH 24.3 24.0 - 34.0 PG    MCHC 31.1 31.0 - 37.0 g/dL    RDW 16.3 (H) 11.6 - 14.5 %    PLATELET 215 135 - 420 K/uL    MPV 11.7 9.2 - 11.8 FL    NEUTROPHILS 32 (L) 40 - 73 %    LYMPHOCYTES 55 (H) 21 - 52 %    MONOCYTES 11 (H) 3 - 10 %    EOSINOPHILS 1 0 - 5 %    BASOPHILS 1 0 - 2 %    ABS. NEUTROPHILS 1.6 (L) 1.8 - 8.0 K/UL    ABS. LYMPHOCYTES 2.6 0.9 - 3.6 K/UL    ABS. MONOCYTES 0.5 0.05 - 1.2 K/UL    ABS. EOSINOPHILS 0.1 0.0 - 0.4 K/UL    ABS. BASOPHILS 0.1 (H) 0.0 - 0.06 K/UL    PLATELET COMMENTS ADEQUATE PLATELETS      RBC COMMENTS NORMOCYTIC, NORMOCHROMIC      DF AUTOMATED     METABOLIC PANEL, COMPREHENSIVE    Collection Time: 05/05/15  4:35 PM   Result Value Ref Range    Sodium 140 136 - 145 mmol/L    Potassium 3.3 (L) 3.5 - 5.5 mmol/L    Chloride 99 (L) 100 - 108 mmol/L    CO2 28 21 - 32 mmol/L    Anion gap 13 3.0 - 18 mmol/L    Glucose 95 74 - 99 mg/dL    BUN 18 7.0 - 18 MG/DL    Creatinine 0.86 0.6 - 1.3 MG/DL    BUN/Creatinine ratio 21 (H) 12 - 20      GFR est AA >60 >60 ml/min/1.59m    GFR est non-AA >60 >60 ml/min/1.753m   Calcium 9.2 8.5 - 10.1 MG/DL     Bilirubin, total 0.4 0.2 - 1.0 MG/DL    ALT 30 13 - 56 U/L    AST 24 15 - 37 U/L    Alk. phosphatase 117 45 - 117 U/L    Protein, total 7.9 6.4 - 8.2 g/dL    Albumin 3.8 3.4 - 5.0 g/dL    Globulin 4.1 (H) 2.0 - 4.0 g/dL    A-G Ratio 0.9 0.8 - 1.7     MAGNESIUM    Collection Time: 05/05/15  4:35 PM  Result Value Ref Range    Magnesium 1.8 1.8 - 2.4 mg/dL   CARDIAC PANEL,(CK, CKMB & TROPONIN)    Collection Time: 05/05/15  4:35 PM   Result Value Ref Range    CK 324 (H) 26 - 192 U/L    CK - MB 3.5 0.5 - 3.6 ng/ml    CK-MB Index 1.1 0.0 - 4.0 %    Troponin-I, Qt. <0.02 0.00 - 0.06 NG/ML   URINALYSIS W/ RFLX MICROSCOPIC    Collection Time: 05/05/15  4:40 PM   Result Value Ref Range    Color YELLOW      Appearance CLEAR      Specific gravity 1.005 1.005 - 1.030      pH (UA) 5.0 5.0 - 8.0      Protein NEGATIVE  NEG mg/dL    Glucose NEGATIVE  NEG mg/dL    Ketone NEGATIVE  NEG mg/dL    Bilirubin NEGATIVE  NEG      Blood NEGATIVE  NEG      Urobilinogen 0.2 0.2 - 1.0 EU/dL    Nitrites NEGATIVE  NEG      Leukocyte Esterase NEGATIVE  NEG          MDM  Number of Diagnoses or Management Options  Chest discomfort:   Dizziness:   Gastroesophageal reflux disease, esophagitis presence not specified:   Hypokalemia:   Labile hypertension:   Diagnosis management comments: DDx: Uncontrolled HTN, Thyroid disease, ACS, R/O renal or other endocrine disorder, R/O dietary noncompliance;    Pt asymptomatic in latter part of ER. W/u reassuring in ER.       Amount and/or Complexity of Data Reviewed  Clinical lab tests: ordered and reviewed  Tests in the radiology section of CPT??: ordered and reviewed (CXR)  Tests in the medicine section of CPT??: ordered and reviewed (EKG)  Independent visualization of images, tracings, or specimens: yes (EKG, CXR)    Risk of Complications, Morbidity, and/or Mortality  Presenting problems: moderate  Diagnostic procedures: moderate  Management options: moderate    Patient Progress  Patient progress: resolved       MEDICATIONS GIVEN:  Medications   potassium chloride (KLOR-CON) packet 20 mEq (not administered)   GI COCKTAIL [facility] CMPD) (not administered)       Procedures    PROGRESS NOTE:  4:12 PM  Initial assessment performed.  Recorded by Kathie Rhodes, ED Scribe, as dictated by Robbie Lis, MD.    6:48 PM  Pt states she still has chest discomfort but HA is resolved. Pt feels much better.     Discharge Note:  6:48 PM   Ashlyne Borello's results have been reviewed with her and/or her family. She has been counseled regarding her diagnosis, treatment, and plan. She verbally conveys understanding and agreement of the signs, symptoms, diagnosis, treatment and prognosis and additionally agrees to follow up as discussed. She also agrees with the care-plan and conveys that all of her questions have been answered. I have also provided discharge instructions for her that include: educational information regarding the diagnosis and treatment, and a list of reasons why She would want to return to the ED prior to her follow-up appointment, should her condition change. Proper ED utilization discussed with the patient.    CLINICAL IMPRESSION    1. Labile hypertension    2. Hypokalemia    3. Dizziness    4. Chest discomfort    5. Gastroesophageal reflux disease, esophagitis presence not specified  After visit plan:  D/c home    Current Discharge Medication List      START taking these medications    Details   potassium chloride (K-DUR, KLOR-CON) 20 mEq tablet Take 1 Tab by mouth daily for 3 days.  Qty: 9 Tab, Refills: 0      famotidine (PEPCID) 20 mg tablet Take 1 Tab by mouth two (2) times a day for 10 days.  Qty: 20 Tab, Refills: 0         CONTINUE these medications which have NOT CHANGED    Details   hydrochlorothiazide (HYDRODIURIL) 12.5 mg tablet Take 12.5 mg by mouth daily.      fluticasone-salmeterol (ADVAIR) 250-50 mcg/dose diskus inhaler Take 1 Puff by inhalation every twelve (12) hours.       cyanocobalamin (VITAMIN B-12) 1,000 mcg tablet Take 1,000 mcg by mouth daily.      cholecalciferol, vitamin D3, (VITAMIN D3) 2,000 unit tab Take  by mouth.      OXYBUTYNIN CHLORIDE (DITROPAN PO) Take  by mouth.      metoprolol (LOPRESSOR) 50 mg tablet Take 50 mg by mouth daily.      NIFEdipine ER (ADALAT CC) 30 mg ER tablet Take 30 mg by mouth daily.      losartan (COZAAR) 100 mg tablet Take 100 mg by mouth daily.      metFORMIN (GLUCOPHAGE) 500 mg tablet Take 500 mg by mouth daily (with breakfast).      azithromycin (ZITHROMAX) 250 mg tablet Take  by mouth daily. Take two tablets today then one tablet daily      MILK THISTLE PO Take  by mouth.    Associated Diagnoses: High cholesterol      SENNOSIDES (SENNA HERBAL LAXATIVE PO) Take  by mouth.    Associated Diagnoses: High cholesterol      HOMEOPATHIC DRUGS (ENZYME PO) Take  by mouth.      mometasone (NASONEX) 50 mcg/actuation nasal spray 2 Sprays daily.             Follow-up Information     Follow up With Details Comments Contact Info    [employer] Call in 2 days  [phone]    [facility] EMERGENCY DEPT  As needed, If symptoms worsen [address]  [place] [address]  [phone]          This note is prepared by Kathie Rhodes, acting as Scribe for Robbie Lis, MD.    Robbie Lis, MD: The scribe's documentation has been prepared under my direction and personally reviewed by me in its entirety. I confirm that the note above accurately reflects all work, treatment, procedures, and medical decision making performed by me.

## 2015-05-05 NOTE — ED Notes (Signed)
I have reviewed discharge instructions with the patient.  The patient verbalized understanding.  Patient armband removed and shredded  rx for pepcid and potassium given to pt. Pt out of ER with friend.

## 2015-05-06 LAB — EKG, 12 LEAD, INITIAL
Atrial Rate: 65 {beats}/min
Calculated P Axis: 33 degrees
Calculated R Axis: 20 degrees
Calculated T Axis: 2 degrees
Diagnosis: NORMAL
P-R Interval: 174 ms
Q-T Interval: 424 ms
QRS Duration: 80 ms
QTC Calculation (Bezet): 440 ms
Ventricular Rate: 65 {beats}/min

## 2015-05-06 LAB — TSH 3RD GENERATION: TSH: 2.06 u[IU]/mL (ref 0.36–3.74)

## 2016-01-09 ENCOUNTER — Inpatient Hospital Stay: Admit: 2016-01-09 | Payer: TRICARE (CHAMPUS) | Primary: Family Medicine

## 2016-01-09 DIAGNOSIS — N3946 Mixed incontinence: Secondary | ICD-10-CM

## 2016-01-09 NOTE — Progress Notes (Signed)
PF DAILY TREATMENT NOTE 3-16    Patient Name: Molly [name]  Date:01/09/2016  DOB: 1955/12/12    Patient DOB Verified  Payor: [employer] / Plan: [facility] [employer] RETIREES AND DEPENDENTS / Product Type: [employer] /    In time:1620  Out time:1725  Total Treatment Time (min): 65  Visit #:1 of 8    Treatment Area:  Pelvic Floor      Other:    SUBJECTIVE  Pain Level (0-10 scale): 4-5/10 groin (due to sciatica R side progressively worse since October 2016.  Improved with steroid injection 2 wks ago  Any medication changes, allergies to medications, adverse drug reactions, diagnosis change, or new procedure performed?:  No     Yes (see summary sheet for update)  Subjective functional status/changes:    No changes reported  See POC    OBJECTIVE    10 min Therapeutic Exercise:   See flow sheet :     Pelvic floor strengthening                   Pelvic floor downtraining    Quality pelvic floor contractions         Relaxation techniques    Urge suppression exercises    Other:  Rationale: increase strength and improve coordination  to improve the patient???s ability to perform functional activities without urinary leakage/urgency       10 min Therapeutic Activity:    See flow sheet :      Increase Tissue extensibility          Assess fiber intake      Assess voiding habits    Assess bowel habits    Other:instruct in completion of bladder diary   Rationale: determine habits for intake/voiding to improve the patient???s ability to perform functional  activities without urinary leakage/urgency          With    TE    TA    neuro   manual    other: Patient Education:  Review HEP     Progressed/Changed HEP based on:    positioning    body mechanics    transfers    heat/ice application     other:Pt instructed in pelvic floor anatomy/function related to Urinary Incontinence.  Patient also instructed in behavior modifications relative to symptoms including dietary intake.      Other Objective/Functional Measures:   baseline resting tone:    slow twitch mms   fast twitch mms    Pain Level (0-10 scale) post treatment: 3-4 right hip /groin    ASSESSMENT/Changes in Function: see POC      Decrease # of leaks    No change   Improving  Resolved       Decrease hypertonus  No change   Improving  Resolved       Increase void interval  No change   Improving  Resolved       Increase PF strength  No change   Improving  Resolved       Increase PF endurance  No change   Improving  Resolved       Increase endurance  No change   Improving  Resolved       Decrease # of pads  No change   Improving  Resolved       Decrease pain  No change   Improving  Resolved       Increased coordination  No change   Improving  Resolved  Increased Bowel Frequency  No change   Improving  Resolved       Patient will continue to benefit from skilled PT services to modify and progress therapeutic interventions, address strength deficits, analyze and address soft tissue restrictions, analyze and cue movement patterns and analyze and modify body mechanics/ergonomics to attain remaining goals.       See Plan of Care    See progress note/recertification    See Discharge Summary         Progress towards goals / Updated goals:  See POC    PLAN    Upgrade activities as tolerated       Continue plan of care    Update interventions per flow sheet         Discharge due to:_    Other:Bladder diary     Veneta Penton, PT 01/09/2016  4:31 PM    No future appointments.

## 2016-01-09 NOTE — Progress Notes (Addendum)
[facility]  [address] [place], [place] [address]  Ph [phone]  Fx [phone]    Plan of Care/ Statement of Necessity for Physical Therapy Services    Patient name: Molly [name] Start of Care: 01/09/2016   Referral source: Lenna Gilford, [facility] DOB: [birth year]    Medical Diagnosis: Mixed incontinence [N39.46]   Onset Date:3 yrs ago    Treatment Diagnosis: Mixed incontinence [N39.46]   Prior Hospitalization: see medical history Provider#: [id number]   Medications: Verified on Patient summary List    Comorbidities: DM Type 2, HTN, Hypothyroid, Arthritis    Prior Level of Function: Symptoms worsened over the last 3 years, with initially void frequency and urinary leakage over the last 4-74months            The Plan of Care and following information is based on the information from the initial evaluation.  Assessment/ key information: Patient is 60yo F who presents with symptoms of mixed urinary incontinence.  Pt reports 2-3  Leakage episodes per day, but not every day.  Void frequency reported as 30 min to every 2 hours.  Pt reports use of panty liner pad protection 3-4 pads per day. Nocturnal frequency reported as 2x/night which began about 1 month ago when.  Pt also reports h/o 4-5/10 R groin pain (due to sciatica R side progressively worse since October 2016 but mproved with steroid injection 2 wks ago).  Evaluation of pelvic floor muscles reveals motor performance 3/5, decreased endurance, and PERF score of 23/35 and right hip/LE pain during internal palpation (perhaps due to positioning and current flare-up).  Pt with decreased R hip AROM as well most limited in flexion 90 deg and I/ERot ~10 deg each. SEMG of pelvic floor muscles for assessment upon f/u visit.  Pt may benefit from pelvic floor physical therapy to address pelvic floor dysfunction with mixed urinary incontinence.    Evaluation Complexity History HIGH Complexity :3+ comorbidities / personal  factors will impact the outcome/ POC ; Examination MEDIUM Complexity : 3 Standardized tests and measures addressing body structure, function, activity limitation and / or participation in recreation  ;Presentation MEDIUM Complexity : Evolving with changing characteristics  ;Clinical Decision Making LOW Complexity : FOTO score of 75-100  Overall Complexity Rating: MEDIUM    Problem List: Pelvic pain/dysfunction, Decreased pelvic floor mm strength, Improper voiding habits and Urinary urgency    Treatment Plan may include any combination of the following:   Therapeutic exercise, Urge suppression techniques, Neuromuscular re-education, Manual therapy, Physical agent/modality, Patient education and Other Biofeedback, EStim    Patient / Family readiness to learn indicated by: asking questions, trying to perform skills and interest    Persons(s) to be included in education: patient (P)    Barriers to Learning/Limitations: None    Patient Goal (s): ???better control of bladder???    Rehabilitation Potential: good    Short Term Goals: To be accomplished in 4 weeks:  1. Patient will demonstrate accurate performance of home exercise program/pelvic floor contractions as adjunct to physical therapy clinic visits to promote healthy lifestyle and improved quality of life.  Status @ Eval:Instructed in HEP to address deficits  2. Patient will Complete Bladder Diary for use in patient education and goal setting.  Status @ Eval:Pt issued bladder Dairy for completion due to Mixed Urinary Incontinence    Long Term Goals: To be accomplished in 8 weeks:  1. Patient will have decreased leakage episodes by 75% or better for increased  quality of life.  Status @ Eval: 2-3 leakages episodes average/day  2. Patient will have decreased pad usage to </= 1/day increased patient comfort and quality of life.  Status @ Eval:3-4 pantyliners/day used  3. Patient will have consistent normal void spacing of 2-4 hr for more  normal function and increased quality of life.  Status @ Eval: Void interval of 30 min to 2 hr    Updated Goal:  4. Patient will have FOTO PFDI Pain score reduced by 8 points for improvement in function  Status @ Eval: PFDI Pain score 8 where lower score represents better pelvic floor function  5. Patient will have FOTO Urinary Problem score change as appropriate indicating improvement in function  Status @ Eval: tba    Frequency / Duration: Patient to be seen 1 times per week for 8 weeks.    Patient/ Caregiver education and instruction: Diagnosis, prognosis, Diet and Other bladder diary completion    Veneta Penton, PT 01/09/2016 4:34 PM    01/16/16  Addendum: Please note change of Frequency/Duration to 2 times per week for 8 weeks as patient may benefit from pelvic ffoor physical therapy to address groin pain now felt right side vaginally as well and myofascial restrictions of right levators ani and suprapubic region.  Goals have been adjusted to reflect same.  Thank you for this referral.    Veneta Penton, PT    01/16/16   9:01 AM  _______________________________________________________________________    I certify that the above Therapy Services are being furnished while the patient is under my care. I agree with the treatment plan and certify that this therapy is necessary.    Physician's Signature:____________________  Date:____________Time:____________    Please sign and return to [facility] at [facility]  [address] [place], [place] [address]                                                                                Ph [phone]  Fx [phone]

## 2016-01-14 ENCOUNTER — Encounter: Payer: TRICARE (CHAMPUS) | Primary: Family Medicine

## 2016-01-15 ENCOUNTER — Inpatient Hospital Stay: Admit: 2016-01-15 | Payer: TRICARE (CHAMPUS) | Primary: Family Medicine

## 2016-01-15 NOTE — Progress Notes (Signed)
PF DAILY TREATMENT NOTE 3-16    Patient Name: Molly [name]  Date:01/15/2016  DOB: Feb 26, 1956    Patient DOB Verified  Payor: TRICARE / Plan: [facility] TRICARE RETIREES AND DEPENDENTS / Product Type: Tricare /    In time:535  Out time:620  Total Treatment Time (min): 45  Total Timed Codes (min): 45  1:1 Treatment Time (MC only):     Visit #: 2 of 8    Treatment Area:  Pelvic Floor      Other:    SUBJECTIVE  Pain Level (0-10 scale): 5/10  Any medication changes, allergies to medications, adverse drug reactions, diagnosis change, or new procedure performed?:  No     Yes (see summary sheet for update)  Subjective functional status/changes:    No changes reported  Pt reports that she is having less leakage with first morning void.  She states that she is having sciatic pain that she feels inside the right side of her vagina and groin as well.    OBJECTIVE    Modality rationale: decrease pain and increase tissue extensibility to improve the patient???s ability to complete functional activities without pain   Min Type Additional Details     Estim:  Unatt       IFC  Premod                        Other:  w/ice   w/heat  Position:  Location:     Estim: Att    TENS instruct  NMES                    Other:  w/US   w/ice   w/heat  Position:  Location:      Ultrasound: Continuous    Pulsed                             Position:  Location:   10   Ice       heat    Ice massage    Laser     Anodyne Position:supineLocation:right groin    Skin assessment post-treatment:  intact redness- no adverse reaction    redness ??? adverse reaction:       45 min Manual Therapy:  MFR to right groin, ilioinguinal region, suprapubic region while simultaneously working on puborectalis, obterator internus, levators, PROM of right hip   Rationale: decrease pain, increase ROM, increase tissue extensibility and decrease edema  to complete functional activities absent of pain            With    TE    TA    neuro   manual     other: Patient Education:  Review HEP     Progressed/Changed HEP based on:    positioning    body mechanics    transfers    heat/ice application     other:            Pain Level (0-10 scale) post treatment:1/10    ASSESSMENT/Changes in Function: Pt demonstrates very restricted puborectalis periurethrally on right side with noted fascial restrictions into right side ilioinguinal region and suprapubic region contributing to difficulty with mobility and ongoing pain.      Decrease # of leaks    No change   Improving  Resolved       Decrease hypertonus  No change   Improving  Resolved  Increase void interval  No change   Improving  Resolved       Increase PF strength  No change   Improving  Resolved       Increase PF endurance  No change   Improving  Resolved       Increase endurance  No change   Improving  Resolved       Decrease # of pads  No change   Improving  Resolved       Decrease pain  No change   Improving  Resolved       Increased coordination  No change   Improving  Resolved       Increased Bowel Frequency  No change   Improving  Resolved       Patient will continue to benefit from skilled PT services to address functional mobility deficits, address ROM deficits, address strength deficits and analyze and address soft tissue restrictions to attain remaining goals.       See Plan of Care    See progress note/recertification    See Discharge Summary         Progress towards goals / Updated goals:  Short Term Goals: To be accomplished in 4 weeks:  1. Patient will demonstrate accurate performance of home exercise program/pelvic floor contractions as adjunct to physical therapy clinic visits to promote healthy lifestyle and improved quality of life.  Status @ Eval:Instructed in HEP to address deficits  2. Patient will Complete Bladder Diary for use in patient education and goal setting.  Status @ Eval:Pt issued bladder Dairy for completion due to Mixed Urinary Incontinence  Current: not returned  ??   Long Term Goals: To be accomplished in 8 weeks:  1. Patient will have decreased leakage episodes by 75% or better for increased quality of life.  Status @ Eval: 2-3 leakages episodes average/day  Current: Pt reports decreased leakage with first morning void.  2. Patient will have decreased pad usage to </= 1/day increased patient comfort and quality of life.  Status @ Eval:3-4 pantyliners/day used  3. Patient will have consistent normal void spacing of 2-4 hr for more normal function and increased quality of life.  Status @ Eval: Void interval of 30 min to 2 hr  Current: Every 90 min to 2 hours currently dependent on intake of diuretic and hydrangea supplement  ??    PLAN    Upgrade activities as tolerated       Continue plan of care    Update interventions per flow sheet         Discharge due to:_    Other:_      Dahlia Byes, PTA 01/15/2016  6:13 PM    Future Appointments  Date Time Provider Department Center   01/21/2016 4:00 PM Dahlia Byes, [place] [facility]   01/22/2016 4:00 PM Candyce Churn, PT [facility]

## 2016-01-21 ENCOUNTER — Inpatient Hospital Stay: Admit: 2016-01-21 | Payer: TRICARE (CHAMPUS) | Primary: Family Medicine

## 2016-01-22 ENCOUNTER — Encounter: Primary: Family Medicine

## 2016-01-22 NOTE — Progress Notes (Cosign Needed Addendum)
PF DAILY TREATMENT NOTE 3-16    Patient Name: Molly [name]  Date:01/22/2016  DOB: [birth year]    Patient DOB Verified  Payor: TRICARE / Plan: [facility] [employer] RETIREES AND DEPENDENTS / Product Type: Tricare /    In time:140  Out time:230  Total Treatment Time (min): 50  Total Timed Codes (min): 40  1:1 Treatment Time (MC only):     Visit #: 3 of 16    Treatment Area:  Pelvic Floor      Other:    SUBJECTIVE  Pain Level (0-10 scale): 3-4/10  Any medication changes, allergies to medications, adverse drug reactions, diagnosis change, or new procedure performed?:  No     Yes (see summary sheet for update)  Subjective functional status/changes:    No changes reported  Pt reports that she has been walking without her cane, only using intermittently.  She states that she has been leaking less and has had less frequency.  She states that her pain in her right leg has been better.    OBJECTIVE    Modality rationale: decrease pain and increase tissue extensibility to improve the patient???s ability to complete functional tasks without pain   Min Type Additional Details     Estim:  Unatt       IFC  Premod                        Other:  w/ice   w/heat  Position:  Location:     Estim: Att    TENS instruct  NMES                    Other:  w/US   w/ice   w/heat  Position:  Location:      Ultrasound: Continuous    Pulsed                             Position:  Location:   10   Ice       heat    Ice massage    Laser     Anodyne Position:supine  Location:right hip/groin    Skin assessment post-treatment:  intact redness- no adverse reaction    redness ??? adverse reaction:         10 min Therapeutic Exercise:   See flow sheet :     Pelvic floor strengthening                   Pelvic floor downtraining    Quality pelvic floor contractions         Relaxation techniques    Urge suppression exercises    Other:hip mobility tasks  Rationale: increase ROM and increase strength  to improve the patient???s  ability to complete functional tasks with absence of pain      30 min Manual Therapy:  MFR to right side ilioinguinal region, right side suprapubic region, levators, puborectalis on right side   Rationale: decrease pain, increase ROM, increase tissue extensibility, decrease edema  and decrease trigger points to complete functional tasks with absence of pain            With    TE    TA    neuro   manual    other: Patient Education:  Review HEP     Progressed/Changed HEP based on: hip mobility tasks   positioning    body mechanics  transfers    heat/ice application     other:          Pain Level (0-10 scale) post treatment: 2/10    ASSESSMENT/Changes in Function: Pt demonstrates significant fascial restrictions in puborectalis in particular periurethrally contributing to ongoing pain.  Additionally pt demonstrates very limited hip rotation however she has improved abduction/adduction since last visit.      Decrease # of leaks    No change   Improving  Resolved       Decrease hypertonus  No change   Improving  Resolved       Increase void interval  No change   Improving  Resolved       Increase PF strength  No change   Improving  Resolved       Increase PF endurance  No change   Improving  Resolved       Increase endurance  No change   Improving  Resolved       Decrease # of pads  No change   Improving  Resolved       Decrease pain  No change   Improving  Resolved       Increased coordination  No change   Improving  Resolved       Increased Bowel Frequency  No change   Improving  Resolved       Patient will continue to benefit from skilled PT services to address functional mobility deficits, address ROM deficits, address strength deficits and analyze and address soft tissue restrictions to attain remaining goals.       See Plan of Care    See progress note/recertification    See Discharge Summary         Progress towards goals / Updated goals:  Short Term Goals: To be accomplished in 4 weeks:   1. Patient will demonstrate accurate performance of home exercise program/pelvic floor contractions as adjunct to physical therapy clinic visits to promote healthy lifestyle and improved quality of life.  Status @ Eval:Instructed in HEP to address deficits  Current: Progressing  2. Patient will Complete Bladder Diary for use in patient education and goal setting.  Status @ Eval:Pt issued bladder Dairy for completion due to Mixed Urinary Incontinence  Current: not returned  ????  Long Term Goals: To be accomplished in 8 weeks:  1. Patient will have decreased leakage episodes by 75% or better for increased quality of life.  Status @ Eval: 2-3 leakages episodes average/day  Current: Pt reports decreased leakage overall  2. Patient will have decreased pad usage to </= 1/day increased patient comfort and quality of life.  Status @ Eval:3-4 pantyliners/day used  Current: Improved  3. Patient will have consistent normal void spacing of 2-4 hr for more normal function and increased quality of life.  Status @ Eval: Void interval of 30 min to 2 hr  Current: Improved on days she takes diuretics  ??  ??PT present during treatment today  PLAN    Upgrade activities as tolerated       Continue plan of care    Update interventions per flow sheet         Discharge due to:_    Other:_      Dahlia Byes, PTA 01/22/2016  2:23 PM    Future Appointments  Date Time Provider Department Center   01/23/2016 3:30 PM Veda Canning, PT [facility]

## 2016-01-23 ENCOUNTER — Inpatient Hospital Stay: Payer: TRICARE (CHAMPUS) | Primary: Family Medicine

## 2016-01-23 ENCOUNTER — Inpatient Hospital Stay: Admit: 2016-01-23 | Discharge: 2016-01-24 | Payer: TRICARE (CHAMPUS) | Primary: Family Medicine

## 2016-01-23 DIAGNOSIS — M545 Low back pain: Secondary | ICD-10-CM

## 2016-01-23 NOTE — Progress Notes (Signed)
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  Yes     No   Yes  No Recent weight change (If yes, due to dieting?  Yes   No)    Yes  No Weakness in legs during walking   Yes  No Unremitting pain at night   Yes  No Abdominal pain or problems   Yes  No Rectal bleeding   Yes  No Feet more cold or painful in cold weather   Yes  No Menstrual irregularities   Yes  No Blood or pain with urination   Yes  No Dysfunction of bowel or bladder   Yes  No Recent illness within past 3 weeks (i.e, cold, flu)   Yes  No Numbness/tingling in buttock/genitalia region    Past History/Treatments:     Diagnostic Tests:  Lab work  X-rays     CT  MRI      Other:  Results:    Functional Status  Prior level of function:  Present functional limitations:  What position do you sleep in?:    OBJECTIVE  Posture:  Lateral Shift:  R     L      +   -  Kyphosis:  Increased  Decreased     WNL   Lordosis:   Increased  Decreased    WNL  Pelvic symmetry:  WNL     Other:    Gait:   Normal      Abnormal:    Active Movements:  N/A    Too acute    Other:  ROM % AROM % PROM Comments:pain, area   Forward flexion 40-60      Extension 20-30      SB right 20-30      SB left 20-30      Rotation right 5-10      Rotation left 5-10        Repeated Movements   Effects on present pain: produces (PR), abolishes (A), increases (incr), decreases (decr), centralizes (C), peripheral (PH), no effect (NE)  Pre-Test Sx Flexion Repeated Flexion Extension Repeated Extension Repeated SBL Repeated SBR   Sitting          Standing          Lying      N/A N/A   Comments:  Side Glide:  Sustained passive positioning test:    Neuro Screen  WNL  Myotome/Dermatome/Reflexes:  Comments:    Dural Mobility:  SLR Sitting:  R     L     +     -  @ (degrees):           Supine:  R     L     +     -  @ (degrees):   Slump Test:  R     L     +     -  @ (degrees):   Prone Knee Bend:  R     L     +     -     Palpation   Min   Mod   Severe    Location:   Min   Mod   Severe    Location:   Min   Mod   Severe    Location:    Stabilization Tests  Multifidus Test  Level 1: Prone abdominal draw in (Goal 6-73mmHG):  Level 2: Supported leg load supine (needle deflection at ):  Yes   No   Level 3: Unsupported leg load supine (needle deflection at ):  Yes   No     Strength   L(0-5) R (0-5) N/T   Hip Flexion (L1,2)  Hip abductors  Hip adductors 4+  5  5 4  pain  5  5    Knee Extension (L3,4) 5 4+    Ankle Dorsiflexion (L4) 5 5    Great Toe Extension (L5)      Ankle Plantarflexion (S1)      Knee Flexion (S1,2) 5 4    Upper Abdominals      Lower Abdominals      Paraspinals      Back Rotators      Gluteus Maximus 5 5    Other        Special Tests  Lumbar:  Lumb. Compression:  Pos   Neg               Lumbar Distraction:    Pos   Neg    Quadrant:   Pos   Neg    Flex   Ext    Sacroilliac:  Gaenslen's:  R     L     +     -     Compression:  +     -      Gapping:   +     -     Thigh Thrust:  R     L     +     -     Leg Length:  +     -   Position:    Crests:    ASIS:    PSIS:    Sacral Sulcus:    Mobility: Standing flex:     Sitting flex:     Supine to sit:     Prone knee bend:         Hip: Pearlean Brownie:   R     L     +     -     Scour:  R     L     +     -     Piriformis:  R     L     +     -          Deficits: Ober's:  R     L     +     -     Thomas:  R     L     +     -     Hamstrings 90/90:    Gastrocsoleus (to neutral): Right: Left:       Global Muscular Weakness:  Abdominals:  Quadratus Lumborum:  Paraspinals:  Other:    Other tests/comments:  right LE shorter in supine with hip flexion contracture  right LE lengthens in sitting- posteriorly rotated right innominate  right standing flexion test  left LE shorter in supin and sitting after METs corrected posteriorly rotated right innominate- left innominate upslip

## 2016-01-23 NOTE — Progress Notes (Signed)
PT DAILY TREATMENT NOTE 12-16    Patient Name: Molly [name]  Date:01/23/2016  DOB: Dec 21, 1955    Patient DOB Verified  Payor: TRICARE / Plan: [facility] [employer] RETIREES AND DEPENDENTS / Product Type: Tricare /    In time:3:45  Out time:4:55  Total Treatment Time (min): 70  Visit #: 1 of 8    Treatment Area: Low back pain [M54.5]  Unilateral primary osteoarthritis, unspecified hip [M16.10]    SUBJECTIVE  Pain Level (0-10 scale): 3/10  Any medication changes, allergies to medications, adverse drug reactions, diagnosis change, or new procedure performed?:  No     Yes (see summary sheet for update)  Subjective functional status/changes:    No changes reported  Oct 2016- insidious onset ache in the right thigh and groin, progressively worsens since Oct 2016  Predisone, injection 2.5 weeks ago in the right groin  Currently seeing pelvic floor (2 weeks)  C/o: right groin Pain, ache right Anterior and medial thigh, sharp pain and crawling paresthesias in the right lower leg to foot, weakness right LE occassional buckling (uses cane as a result), last week left knee ache, better when sitting, worse when walking  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)  Goals: "I want to strengthen the pelvic area, and LEs, exercise again."    OBJECTIVE    Modality rationale:    Min Type Additional Details     Estim:  Unatt       IFC  Premod                        Other:  w/ice   w/heat  Position:  Location:     Estim: Att    TENS instruct  NMES                    Other:  w/US   w/ice   w/heat  Position:  Location:      Traction:  Cervical       Lumbar                        Prone          Supine                       Intermittent   Continuous Lbs:   before manual   after manual      Ultrasound: Continuous    Pulsed                             W/cm2:  Location:      Iontophoresis with dexamethasone         Location:  Take home patch    In clinic      Ice       heat    Ice massage    Laser      Anodyne Position:  Location:      Laser with stim    Other:  Position:  Location:      Vasopneumatic Device Pressure:        lo  med  hi   Temperature:  lo  med  hi    Skin assessment post-treatment:  intact redness- no adverse reaction    redness ??? adverse reaction:     40 min Eval  Re-Eval        min Therapeutic Exercise:   See flow sheet :        min Therapeutic Activity:    See flow sheet :        15 min Neuromuscular Re-education:    See flow sheet :  Added glutes sets, prone heel presses, bridges with adduction, hip flexor stretches in standing, prone lying hip flexor stretches/positioning   Rationale: increase strength, improve coordination and increase proprioception  to improve the patient???s ability to tolerate positions and maintain innominate alignment.    15 min Manual Therapy:    METs to correct posteriorly rotated right innominate and left innominate upslip (sucessful)   Rationale: decrease pain, increase ROM, increase tissue extensibility and correct joint alignment and joint mechanics to tolerate positions, normalize gait, and normalize ADLs.     min Gait Training:  ___ feet with ___ device on level surfaces with ___ level of assist   Rationale:          With    TE    TA    neuro    other: Patient Education:  Review HEP     Progressed/Changed HEP based on:    positioning    body mechanics    transfers    heat/ice application     other:      Other Objective/Functional Measures:   See evaluation and plan of care.     Pain Level (0-10 scale) post treatment: 1.5/10    ASSESSMENT/Changes in Function:    Pt displayed a posteriorly rotated right innominate and left innominate upslip (both successfully corrected today resulting in a significant decrease of right groin and right LE pain).  Pt also displayed mild right hip flexor tightness with PROM right hip extension: -20 degrees with muscular end feel and pain.  Hip extension PROM improved after correcting  innominate alignment, but hip extension beyond neutral still increased right groin pain with muscular end feel.  LEs' strength grossly 4/5 to 5/5 right LE and 4+/5 to 5/5 left LE.       Patient will continue to benefit from skilled PT services to modify and progress therapeutic interventions, address functional mobility deficits, address ROM deficits, address strength deficits, analyze and address soft tissue restrictions, analyze and cue movement patterns, analyze and modify body mechanics/ergonomics, assess and modify postural abnormalities and instruct in home and community integration to attain remaining goals.       See Plan of Care    See progress note/recertification    See Discharge Summary         Progress towards goals / Updated goals:  Short Term Goals: To be accomplished in 3 weeks:    1) Goal: Pt's right groin pain will decrease to 3/10 at worst in order to better tolerate all ADLs and lay prone for at least 5 minutes.  Status at initial evaluation: pain 10/10 at worst  Current Status: not reassessed     2) Goal: Innominate alignment will remain symmetric and stable in order to decrease pain and improve tolerance for ambulation and ADLs.  Status at initial evaluation: posteriorly rotated right innominate and left innominate upslip  Current Status: not reassessed     3) Goal: Pt will be able to lay in prone position in order to sleep in various positions and walk with symmetric step length bilaterally  Status at initial evaluation: painful in prone position  Current Status: not reassessed     4) Goal: Right LE radicular  symptoms will be centralized to the low back in order to demonstrate effectiveness of directional preference exercises and decrease risk of LE dysfunction.  Status at initial evaluation: right groin pain, ache right anterior and medial thigh, sharp pain and crawling paresthesias in the right lower leg to foot, weakness right LE with occassional buckling of knee    Current Status: not reassessed     5) Goal: Pt will be independent and compliant with HEP to achieve other goals.  Status at initial evaluation: pt is not independent with exercises  Current Status: not reassessed    Long Term Goals: To be accomplished in 6 weeks:    1) Goal: Abolish LBP and radicular symptoms in order to return to normal function.  Status at initial evaluation: pain 10/10 at worst in right groin and right LE to foot   Current Status: not reassessed     2) Goal: Pt will be able to demonstrate ability to self manage LBP and right LE pain to 1/10 at worst during all activities in order to return to normal function.  Status at initial evaluation: pain 10/10 at worst in right groin and right LE to foot  Current Status: not reassessed     PLAN    Upgrade activities as tolerated       Continue plan of care    Update interventions per flow sheet         Discharge due to:_    Other: METs, LE strengthening/stabilization, back/core/innominate stabilization, possible McKenzie (MDT) principles, postural restoration (PRI) principles      Candyce Churn, PT 01/23/2016  4:52 PM    Future Appointments  Date Time Provider Department Center   01/25/2016 3:00 PM Dahlia Byes, PTA [facility]   01/28/2016 4:45 PM Dahlia Byes, PTA [facility]   01/31/2016 4:00 PM [facility] PT [facility]ORY 2 [facility]   02/07/2016 4:00 PM [facility] PT VICTORY 2 [facility]   02/13/2016 5:00 PM Dahlia Byes, PTA [facility]   02/14/2016 4:00 PM [facility] PT VICTORY 2 [facility]   02/18/2016 4:45 PM Dahlia Byes, PTA [facility]   02/20/2016 4:15 PM Dahlia Byes, PTA [facility]

## 2016-01-23 NOTE — Progress Notes (Signed)
[facility]  [address] [place], [place] [address]  Ph [phone]  Fx [phone]    Plan of Care/ Statement of Necessity for Physical Therapy Services    Patient name: Flower Franko Start of Care: 01/23/2016   Referral source: Jefm Miles, [place] DOB: [birth year]    Medical Diagnosis: Low back pain [M54.5]  Unilateral primary osteoarthritis, unspecified hip [M16.10] Onset Date: Oct 2016    Treatment Diagnosis: decreased ROM, decreased functional and positional tolerance, decreased LE strength, pelvis obliquity   Prior Hospitalization: see medical history Provider#: [id number]   Medications: Verified on Patient summary List    Comorbidities: DM, thyroid problems, HTN, OA   Prior Level of Function: 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  Assessment/ key information:    Pt displayed a posteriorly rotated right innominate and left innominate upslip (both successfully corrected today resulting in a significant decrease of right groin and right LE pain). Pt also displayed mild right hip flexor tightness with PROM right hip extension: -20 degrees with muscular end feel and pain. Hip extension PROM improved after correcting innominate alignment, but hip extension beyond neutral still increased right groin pain with muscular end feel. LEs' strength grossly 4/5 to 5/5 right LE and 4+/5 to 5/5 left LE.    Evaluation Complexity History MEDIUM  Complexity : 1-2 comorbidities / personal factors will impact the outcome/ POC ; Examination HIGH Complexity : 4+ Standardized tests and measures addressing body structure, function, activity limitation and / or participation in recreation  ;Presentation MEDIUM Complexity : Evolving with changing characteristics  ;Clinical Decision Making MEDIUM Complexity : FOTO score of 26-74  Overall Complexity Rating: MEDIUM   Problem List: pain affecting function, decrease ROM, decrease strength, decrease ADL/ functional abilitiies, decrease activity tolerance and decrease flexibility/ joint mobility   Treatment Plan may include any combination of the following: Therapeutic exercise, Therapeutic activities, Neuromuscular re-education, Physical agent/modality, Manual therapy and Patient education  Patient / Family readiness to learn indicated by: asking questions, trying to perform skills and interest  Persons(s) to be included in education: patient (P)  Barriers to Learning/Limitations: None  Patient Goal (s): "I want to strengthen the pelvic area, and LEs, exercise again."  Patient Self Reported Health Status: good  Rehabilitation Potential: good    Short Term Goals: To be accomplished in 3 weeks:    1) Goal: Pt's right groin pain will decrease to 3/10 at worst in order to better tolerate all ADLs and lay prone for at least 5 minutes.  Status at initial evaluation: pain 10/10 at worst   2) Goal: Innominate alignment will remain symmetric and stable in order to decrease pain and improve tolerance for ambulation and ADLs.  Status at initial evaluation: posteriorly rotated right innominate and left innominate upslip   3) Goal: Pt will be able to lay in prone position in order to sleep in various positions and walk with symmetric step length bilaterally  Status at initial evaluation: painful in prone position   4) Goal: Right LE radicular symptoms will be centralized to the low back in order to demonstrate effectiveness of directional preference exercises and decrease risk of LE dysfunction.  Status at initial evaluation: right groin pain, ache right anterior and medial thigh, sharp pain and crawling paresthesias in the right lower leg to foot, weakness right LE  with occassional buckling of knee    5) Goal: Pt will be independent and compliant with HEP to achieve other goals.   Status at initial evaluation: pt is not independent with exercises  ??  Long Term Goals: To be accomplished in 6 weeks:    1) Goal: Abolish LBP and radicular symptoms in order to return to normal function.  Status at initial evaluation: pain 10/10 at worst in right groin and right LE to foot    2) Goal: Pt will be able to demonstrate ability to self manage LBP and right LE pain to 1/10 at worst during all activities in order to return to normal function.  Status at initial evaluation: pain 10/10 at worst in right groin and right LE to foot    Frequency / Duration: Patient to be seen 2 times per week for 4 weeks.    Patient/ Caregiver education and instruction: Diagnosis, prognosis, self care, activity modification and exercises, plan of care     Plan of care has been reviewed with PTA    Candyce Churn, PT 01/23/2016 4:51 PM    ________________________________________________________________________    I certify that the above Therapy Services are being furnished while the patient is under my care. I agree with the treatment plan and certify that this therapy is necessary.    Physician's Signature:____________________  Date:____________Time: _________    Please sign and return to [facility]  [address] [place], [place] [address]  Ph [phone]  Fx [phone]

## 2016-01-25 ENCOUNTER — Encounter: Primary: Family Medicine

## 2016-01-28 ENCOUNTER — Inpatient Hospital Stay: Admit: 2016-01-29 | Payer: TRICARE (CHAMPUS) | Primary: Family Medicine

## 2016-01-28 NOTE — Progress Notes (Signed)
PF DAILY TREATMENT NOTE 3-16    Patient Name: Molly [name]  Date:01/28/2016  DOB: 1956-10-16    Patient DOB Verified  Payor: TRICARE / Plan: [facility] TRICARE RETIREES AND DEPENDENTS / Product Type: Tricare /    In time:440  Out time:535  Total Treatment Time (min): 55  Total Timed Codes (min): 45  1:1 Treatment Time (MC only):     Visit #: 4 of 16    Treatment Area:  Pelvic Floor      Other:    SUBJECTIVE  Pain Level (0-10 scale):2-3/10  Any medication changes, allergies to medications, adverse drug reactions, diagnosis change, or new procedure performed?:  No     Yes (see summary sheet for update)  Subjective functional status/changes:    No changes reported  Pt reports that she has had less leakage and less frequency since last week.  Additionally she has had less pain.  She states that she started therapy for her knee last week as well.    OBJECTIVE    Modality rationale: decrease pain and increase tissue extensibility to improve the patient???s ability to complete functional tasks absent of pain   Min Type Additional Details     Estim:  Unatt       IFC  Premod                        Other:  w/ice   w/heat  Position:  Location:     Estim: Att    TENS instruct  NMES                    Other:  w/US   w/ice   w/heat  Position:  Location:      Ultrasound: Continuous    Pulsed                             Position:  Location:   10   Ice       heat    Ice massage    Laser     Anodyne Position:supine  Location:right hip    Skin assessment post-treatment:  intact redness- no adverse reaction       10 min Therapeutic Activity:    See flow sheet :      Increase Tissue extensibility          Assess fiber intake      Assess voiding habits    Assess bowel habits    Other: Constipation management, reduction of dairy  Rationale: increase ROM, increase strength and improve coordination  to improve the patient???s ability to complete functional tasks absent of pain         35 min Manual Therapy:  MFR to puborectalis, levators, left side obterator internus, coccygeus   Rationale: decrease pain, increase ROM and increase tissue extensibility to complete functional tasks absent of pain    With    TE    TA    neuro   manual    other: Patient Education:  Review HEP     Progressed/Changed HEP based on:    positioning    body mechanics    transfers    heat/ice application     other:        Pain Level (0-10 scale) post treatment: 1/10 (pt did note slight pink discharge (bloody) post treatment.  Pt asked to keep an eye on discharge.  ASSESSMENT/Changes in Function: Pt demonstrates improving ROM and reduced fascial restrictions in pelvic floor with reduced reports of pain.  She continues to have limited right hip mobility contributing to overall mobility issues however she does demonstrate improved hip flexion, abduction and external rotation following manual work.  She demonstrates a 3/5 MMT however upper vault still immobile, adjacent to urethra.      Decrease # of leaks    No change   Improving  Resolved       Decrease hypertonus  No change   Improving  Resolved       Increase void interval  No change   Improving  Resolved       Increase PF strength  No change   Improving  Resolved       Increase PF endurance  No change   Improving  Resolved       Increase endurance  No change   Improving  Resolved       Decrease # of pads  No change   Improving  Resolved       Decrease pain  No change   Improving  Resolved       Increased coordination  No change   Improving  Resolved       Increased Bowel Frequency  No change   Improving  Resolved       Patient will continue to benefit from skilled PT services to address functional mobility deficits, address ROM deficits, address strength deficits and analyze and address soft tissue restrictions to attain remaining goals.       See Plan of Care    See progress note/recertification    See Discharge Summary         Progress towards goals / Updated goals:   Short Term Goals: To be accomplished in 4 weeks:  1. Patient will demonstrate accurate performance of home exercise program/pelvic floor contractions as adjunct to physical therapy clinic visits to promote healthy lifestyle and improved quality of life.  Status @ Eval:Instructed in HEP to address deficits  Current: Progressing  2. Patient will Complete Bladder Diary for use in patient education and goal setting.  Status @ Eval:Pt issued bladder Dairy for completion due to Mixed Urinary Incontinence  Current: not returned  ????  Long Term Goals: To be accomplished in 8 weeks:  1. Patient will have decreased leakage episodes by 75% or better for increased quality of life.  Status @ Eval: 2-3 leakages episodes average/day  Current: Pt reports decreased leakage overall  2. Patient will have decreased pad usage to </= 1/day increased patient comfort and quality of life.  Status @ Eval:3-4 pantyliners/day used  Current: Improved  3. Patient will have consistent normal void spacing of 2-4 hr for more normal function and increased quality of life.  Status @ Eval: Void interval of 30 min to 2 hr  Current: Progressing, approaching normal void spacing  ????    PLAN    Upgrade activities as tolerated       Continue plan of care    Update interventions per flow sheet         Discharge due to:_    Other:_      Dahlia Byesatricia Rudolfo Brandow, PTA 01/28/2016  5:34 PM    Future Appointments  Date Time Provider Department Center   01/29/2016 3:30 PM Veda Canningrkena L Dailey, PT [facility]   01/31/2016 4:00 PM [facility]   02/01/2016 3:00 PM Candyce ChurnJohn Klemer, PT [facility]  02/04/2016 3:30 PM Candyce Churn, PT [facility]   02/06/2016 3:30 PM Candyce Churn, PT [facility]   02/07/2016 4:00 PM [facility] PT VICTORY 2 [facility]   02/11/2016 3:30 PM Candyce Churn, PT [facility]   02/13/2016 5:00 PM Dahlia Byes, PTA [facility]   02/14/2016 4:00 PM [facility] PT VICTORY 2 [facility]   02/15/2016 2:30 PM Candyce Churn, PT [facility]    02/18/2016 4:45 PM Dahlia Byes, PTA [facility]   02/19/2016 2:30 PM Veda Canning, PT [facility]   02/20/2016 4:15 PM Dahlia Byes, PTA [facility]PT[facility]   02/22/2016 2:00 PM Candyce Churn, PT [facility]

## 2016-01-29 ENCOUNTER — Inpatient Hospital Stay
Admit: 2016-01-29 | Payer: TRICARE (CHAMPUS) | Attending: Rehabilitative and Restorative Service Providers" | Primary: Family Medicine

## 2016-01-29 NOTE — Progress Notes (Signed)
PT DAILY TREATMENT NOTE 12-16    Patient Name: Molly [name]  Date:01/29/2016  DOB: 17-Oct-1956    Patient DOB Verified  Payor: TRICARE / Plan: [facility] TRICARE RETIREES AND DEPENDENTS / Product Type: Tricare /    In time:3:33  Out time:4:32  Total Treatment Time (min): 50  Visit #: 2 of 8    Treatment Area: Low back pain [M54.5]  Unilateral primary osteoarthritis, unspecified hip [M16.10]    SUBJECTIVE  Pain Level (0-10 scale): 5  Any medication changes, allergies to medications, adverse drug reactions, diagnosis change, or new procedure performed?:  No     Yes (see summary sheet for update)  Subjective functional status/changes:    No changes reported  I have more soreness after my session with PF yesterday.     OBJECTIVE    Modality rationale: decrease inflammation, decrease pain and increase tissue extensibility to improve the patient???s ability to improve mobility     Min Type Additional Details     Estim:  Unatt       IFC  Premod                        Other:  w/ice   w/heat  Position:  Location:     Estim: Att    TENS instruct  NMES                    Other:  w/US   w/ice   w/heat  Position:  Location:      Traction:  Cervical       Lumbar                        Prone          Supine                       Intermittent   Continuous Lbs:   before manual   after manual      Ultrasound: Continuous    Pulsed                           1MHz   3MHz W/cm2:  Location:      Iontophoresis with dexamethasone         Location:  Take home patch    In clinic   10   Ice       heat    Ice massage    Laser     Anodyne Position:supine  Location: low back       Laser with stim    Other:  Position:  Location:      Vasopneumatic Device Pressure:        lo  med  hi   Temperature:  lo  med  hi    Skin assessment post-treatment:  intact redness- no adverse reaction    redness ??? adverse reaction:     40 min Therapeutic Exercise:   See flow sheet :   Rationale: increase ROM, increase strength, improve coordination, improve  balance and increase proprioception to improve the patient???s ability to improve strength for ADL tolerance.             With    TE    TA    neuro    other: Patient Education:  Review HEP     Progressed/Changed HEP based on:    positioning  body mechanics    transfers    heat/ice application     other: TA bracing and breathing with exercise.      Other Objective/Functional Measures: none      Pain Level (0-10 scale) post treatment: 3    ASSESSMENT/Changes in Function: pt needed TA bracing cues and cues for posture with there-ex. Increased time was needed for pt to change positions on the plinth and for rest periods.     Patient will continue to benefit from skilled PT services to modify and progress therapeutic interventions, address functional mobility deficits, address ROM deficits, address strength deficits, analyze and address soft tissue restrictions, analyze and cue movement patterns, analyze and modify body mechanics/ergonomics, assess and modify postural abnormalities and address imbalance/dizziness to attain remaining goals.       See Plan of Care    See progress note/recertification    See Discharge Summary         Progress towards goals / Updated goals:  Short Term Goals: To be accomplished in 3 weeks:   1) Goal: Pt's right groin pain will decrease to 3/10 at worst in order to better tolerate all ADLs and lay prone for at least 5 minutes.  Status at initial evaluation: pain 10/10 at worst  Current Status: not reassessed  ??  2) Goal: Innominate alignment will remain symmetric and stable in order to decrease pain and improve tolerance for ambulation and ADLs.  Status at initial evaluation: posteriorly rotated right innominate and left innominate upslip  Current Status: not reassessed  ??  3) Goal: Pt will be able to lay in prone position in order to sleep in various positions and walk with symmetric step length bilaterally  Status at initial evaluation: painful in prone position   Current Status: not reassessed  ??  4) Goal: Right LE radicular symptoms will be centralized to the low back in order to demonstrate effectiveness of directional preference exercises and decrease risk of LE dysfunction.  Status at initial evaluation: right groin pain, ache right anterior and medial thigh, sharp pain and crawling paresthesias in the right lower leg to foot, weakness right LE with occassional buckling of knee   Current Status: not reassessed  ??  5) Goal: Pt will be independent and compliant with HEP to achieve other goals.  Status at initial evaluation: pt is not independent with exercises  Current Status: not reassessed  ??  Long Term Goals: To be accomplished in 6 weeks:   1) Goal: Abolish LBP and radicular symptoms in order to return to normal function.  Status at initial evaluation: pain 10/10 at worst in right groin and right LE to foot   Current Status: not reassessed  ??  2) Goal: Pt will be able to demonstrate ability to self manage LBP and right LE pain to 1/10 at worst during all activities in order to return to normal function.  Status at initial evaluation: pain 10/10 at worst in right groin and right LE to foot  Current Status: not reassessed   ??    PLAN    Upgrade activities as tolerated       Continue plan of care    Update interventions per flow sheet         Discharge due to:_    Other:_      Veda Canning, PT 01/29/2016  4:31 PM    Future Appointments  Date Time Provider Department Center   01/31/2016 4:00 PM [facility] PT VICTORY 2 [facility]  02/01/2016 3:00 PM Candyce Churn, PT [facility]   02/04/2016 3:30 PM Candyce Churn, PT [facility]   02/06/2016 3:30 PM Candyce Churn, PT [facility]   02/07/2016 4:00 PM [facility] PT VICTORY 2 [facility]   02/11/2016 3:30 PM Candyce Churn, PT [facility]   02/13/2016 5:00 PM Dahlia Byes, PTA [facility]   02/14/2016 4:00 PM [facility] PT VICTORY 2 [facility]   02/15/2016 2:30 PM Candyce Churn, PT [facility]   02/18/2016 4:45 PM Dahlia Byes, PTA [facility]    02/19/2016 2:30 PM Veda Canning, PT [facility]   02/20/2016 4:15 PM Dahlia Byes, PTA [facility]   02/22/2016 2:00 PM Candyce Churn, PT [facility]

## 2016-01-31 ENCOUNTER — Inpatient Hospital Stay: Admit: 2016-01-31 | Payer: TRICARE (CHAMPUS) | Primary: Family Medicine

## 2016-01-31 NOTE — Progress Notes (Signed)
[facility] at [place], [place] [address]  Phone: [phone]   Fax: [phone]      Pelvic Floor Progress Note  Patient name: Molly [name] Start of Care: 01/09/2016   Referral source: Crisoforo Oxford, MD DOB: Jun 06, 1956   Medical/Treatment Diagnosis: Mixed incontinence [N39.46] Onset Date:3 years ago     Prior Hospitalization: see medical history Provider#: [phone]   Medications: Verified on Patient Summary List    Comorbidities: DM type 2, HTN, hypothyroid, arthritis  Prior Level of Function:Symptoms worsened over the last 3 years, with initially void frequency and urinary leakage over the last 4-5 months    Visits from Start of Care: 6    Missed Visits: 0    Established Goals:           Excellent Good         Limited           None   Increase Pelvic MM strength                Decrease Pelvic MM hypertonus              Decrease Incontinence Episodes              Improve Voiding Habits                  Decreased Urgency                 Decrease Pelvic Pain                  Key Functional Changes: Patient reports improved overall bladder function with decreased leakage episodes and difficulty with controlling urgency  Short Term Goals: To be accomplished in 4 weeks:  1. Patient will demonstrate accurate performance of home exercise program/pelvic floor contractions as adjunct to physical therapy clinic visits to promote healthy lifestyle and improved quality of life.  Status @ Eval:Instructed in HEP to address deficits  Current: Progressing with urge suppression handout provided and increase in PFM contraction  2. Patient will Complete Bladder Diary for use in patient education and goal setting.  Status @ Eval:Pt issued bladder Dairy for completion due to Mixed Urinary Incontinence  Current: not returned  ????  Long Term Goals: To be accomplished in 8 weeks:  1. Patient will have decreased leakage episodes by 75% or better for increased quality of life.   Status @ Eval: 2-3 leakages episodes average/day  Current: Pt reports decreased leakage overall  2. Patient will have decreased pad usage to </= 1/day increased patient comfort and quality of life.  Status @ Eval:3-4 pantyliners/day used  Current: Patient reports similar amount of panty liners however mostly to maintain dryness with decreased reported leakage  3. Patient will have consistent normal void spacing of 2-4 hr for more normal function and increased quality of life.  Status @ Eval: Void interval of about 2 hours. Goal MET    ASSESSMENT/RECOMMENDATIONS:  Patient progressing with overall functional activities and mobility with decreased noted pain and leakage episodes. She continues to have pain with obturator internus on her right leg causing radiating pain down into anterior aspect of thigh and groin region.  Continue therapy per initial plan/protocol at a frequency of  1 x per week for 4 weeks  Continue therapy with the following recommended changes:_____________________      _____________________________________________________________________  Discontinue therapy progressing towards or have reached established goals  Discontinue therapy due to  lack of appreciable progress towards goals  Discontinue therapy due to lack of attendance or compliance  Await Physician's recommendations/decisions regarding therapy  Other:________________________________________________________________    Thank you for this referral.   Tona Sensing 01/31/2016 5:15 PM  NOTE TO PHYSICIAN:  PLEASE COMPLETE THE ORDERS BELOW AND   FAX TO [facility]    Fax: [phone]  If you are unable to process this request in 24 hours please contact our office:    [phone]      ? I have read the above report and request that my patient continue as recommended.  ? I have read the above report and request that my patient continue therapy with the following changes/special  instructions:___________________________________________________________  ? I have read the above report and request that my patient be discharged from therapy.    Physician???s signature: _______________________________Date: _____Time:_____

## 2016-01-31 NOTE — Progress Notes (Addendum)
PF DAILY TREATMENT NOTE 3-16    Patient Name: Molly [name]  Date:01/31/2016  DOB: May 05, 1956    Patient DOB Verified  Payor: TRICARE / Plan: [facility] TRICARE RETIREES AND DEPENDENTS / Product Type: Tricare /    In time:0410  Out time:0505  Total Treatment Time (min): 35  Visit #: 5 of 8    Treatment Area:  Pelvic Floor      Other:    SUBJECTIVE  Pain Level (0-10 scale): 3/10  Any medication changes, allergies to medications, adverse drug reactions, diagnosis change, or new procedure performed?:  No     Yes (see summary sheet for update)  Subjective functional status/changes:    No changes reported  Patient reports she has improved in all areas. She has less leakage and is able to hold off first urge to void better. She is getting up less during the night in which she reported no nocturia the past 2-3 days. She still has panty liner but only minimal amount of leakage noted. Pain has improved in groin region reporting only 3/10 pain today and mostly due to soreness. Also, she notes decreased constipation since treatment began.   OBJECTIVE    20 min Manual Therapy:  MFR to puborectalis, obturator internus with TTP noted R. R hip flexor trigger point release performed simultaneously as obturator internus.      Rationale: decrease pain, increase ROM, increase tissue extensibility and decrease trigger points to so patient can perform functional activities    15 min Bladder Training : patient provided with urge suppression technique handout add to HEP    voiding schedule           program progression   decrease every 2 hours                 Other Objective/Functional Measures:   PERF-3, 7,5,4=19/35      Pain Level (0-10 scale) post treatment: 2/10    ASSESSMENT/Changes in Function: Patient progressing with overall functional activities and mobility with decreased noted pain and leakage episodes. She continues to have pain with obturator internus on her right  leg causing radiating pain down into anterior aspect of thigh and groin region.      Decrease # of leaks    No change   Improving  Resolved       Decrease hypertonus  No change   Improving  Resolved       Increase void interval  No change   Improving  Resolved       Increase PF strength  No change   Improving  Resolved       Increase PF endurance  No change   Improving  Resolved       Increase endurance  No change   Improving  Resolved       Decrease # of pads  No change   Improving  Resolved       Decrease pain  No change   Improving  Resolved       Increased coordination  No change   Improving  Resolved       Increased Bowel Frequency  No change   Improving  Resolved       Patient will continue to benefit from skilled PT services to modify and progress therapeutic interventions, address functional mobility deficits, address ROM deficits, address strength deficits and analyze and address soft tissue restrictions to attain remaining goals.       See Plan of Care    See  progress note/recertification    See Discharge Summary         Progress towards goals / Updated goals:  Short Term Goals: To be accomplished in 4 weeks:  1. Patient will demonstrate accurate performance of home exercise program/pelvic floor contractions as adjunct to physical therapy clinic visits to promote healthy lifestyle and improved quality of life.  Status @ Eval:Instructed in HEP to address deficits  Current: Progressing with urge suppression handout provided and increase in PFM contraction  2. Patient will Complete Bladder Diary for use in patient education and goal setting.  Status @ Eval:Pt issued bladder [place] for completion due to Mixed Urinary Incontinence  Current: not returned  ????  Long Term Goals: To be accomplished in 8 weeks:  1. Patient will have decreased leakage episodes by 75% or better for increased quality of life.  Status @ Eval: 2-3 leakages episodes average/day  Current: Pt reports decreased leakage overall   2. Patient will have decreased pad usage to </= 1/day increased patient comfort and quality of life.  Status @ Eval:3-4 pantyliners/day used  Current: Patient reports similar amount of panty liners however mostly to maintain dryness with decreased reported leakage  3. Patient will have consistent normal void spacing of 2-4 hr for more normal function and increased quality of life.  Status @ Eval: Void interval of about 2 hours. Goal MET  PLAN    Upgrade activities as tolerated       Continue plan of care    Update interventions per flow sheet         Discharge due to:_    Other:_      Tona Sensing 01/31/2016  4:27 PM    Future Appointments  Date Time Provider [place]   02/01/2016 3:00 PM Verne Spurr, PT [facility]   02/04/2016 3:30 PM Verne Spurr, PT [facility]   02/06/2016 3:30 PM Verne Spurr, PT [facility]   02/07/2016 4:00 PM [facility] PT VICTORY 2 [facility]   02/11/2016 3:30 PM Verne Spurr, PT [facility]   02/13/2016 5:00 PM Kandis Nab, PTA [facility]   02/14/2016 4:00 PM [facility] PT VICTORY 2 [facility]   02/15/2016 2:30 PM Verne Spurr, PT [facility]   02/18/2016 4:45 PM Kandis Nab, PTA [facility]   02/19/2016 2:30 PM Arbutus Leas, PT [facility]   02/20/2016 4:15 PM Kandis Nab, PTA [facility]   02/22/2016 2:00 PM Verne Spurr, PT [facility]

## 2016-02-01 ENCOUNTER — Inpatient Hospital Stay: Admit: 2016-02-01 | Discharge: 2016-02-01 | Payer: TRICARE (CHAMPUS) | Primary: Family Medicine

## 2016-02-01 NOTE — Progress Notes (Signed)
PT DAILY TREATMENT NOTE - MCR 12-16    Patient Name: Molly [name]  Date:02/01/2016  DOB: 08/30/1956    Patient DOB Verified  Payor: TRICARE / Plan: [facility] TRICARE RETIREES AND DEPENDENTS / Product Type: Tricare /    In time:3:05  Out time:4:25  Total Treatment Time (min): 80  Visit #: 3 of 8    Treatment Area: Low back pain [M54.5]  Unilateral primary osteoarthritis, unspecified hip [M16.10]    SUBJECTIVE  Pain Level (0-10 scale): 3/10  Any medication changes, allergies to medications, adverse drug reactions, diagnosis change, or new procedure performed?:  No     Yes (see summary sheet for update)  Subjective functional status/changes:    No changes reported  "It's getting better."    OBJECTIVE    Modality rationale: decrease pain and increase ROM to improve the patient???s ability to lay supine and tolerate positions to sleep.   Min Type Additional Details     Estim:  Unatt       IFC  Premod                        Other:  w/ice   w/heat  Position:  Location:     Estim: Att    TENS instruct  NMES                    Other:  w/US   w/ice   w/heat  Position:  Location:      Traction:  Cervical       Lumbar                        Prone          Supine                       Intermittent   Continuous Lbs:   before manual   after manual      Ultrasound: Continuous    Pulsed                           1MHz   3MHz W/cm2:  Location:      Iontophoresis with dexamethasone         Location:  Take home patch    In clinic   10   Ice       heat    Ice massage    Laser     Anodyne Position: supine  Location: applied to the       Laser with stim    Other:  Position:  Location:      Vasopneumatic Device Pressure:        lo  med  hi   Temperature:  lo  med  hi    Skin assessment post-treatment:  intact redness- no adverse reaction    redness ??? adverse reaction:      min Eval                  Re-Eval       20 min Therapeutic Exercise:   See flow sheet :   Rationale: decrease pain and increase ROM to improve the patient???s ability  to lay supine and tolerate positions to sleep.    20 min Therapeutic Activity:    See flow sheet :  Mechanical spine assessment   Rationale: decrease pain and decrease radiculopathy  to improve  the patient???s ability to tolerate positions and ADLs.     18 min Neuromuscular Re-education:    See flow sheet :   Increase strength and coordination and proprioception to tolerate positions and ADLs.    12 min Manual Therapy:    PROM right hip flexor stretches in left side lying   Rationale: decrease pain, increase ROM and increase tissue extensibility to tolerate supine lying and stand up straight.     min Gait Training:  ___ feet with ___ device on level surfaces with ___ level of assist   Rationale:          With    TE    TA    neuro    other: Patient Education:  Review HEP     Progressed/Changed HEP based on:    positioning    body mechanics    transfers    heat/ice application     other:      Other Objective/Functional Measures:   Innominates aligned.  Sitting: 0/10  Standing: 0-1/10  REIS x10 reps: 1.5/10 right anterior groin tightness  RFISitting x10 reps: pain 1/10 anterior right groin tightness  Prone x1 minute: increased anterior groin tightness (1.5/10)  POE x1 minute: ncreased anterior groin tightness (2.5/10)   REIL x10 reps: NE (2.5/10)    Pain Level (0-10 scale) post treatment: 0-1/10    ASSESSMENT/Changes in Function:    No clear directional preference found for the L-spine, but radicular symptoms in the right LE have abolished since correcting posteriorly rotated right innominate and left innominate upslip at [facility].  Right flexor tightness/contracture still present and the symptoms that pt continues to report may relate to right hip flexor tightness.     Patient will continue to benefit from skilled PT services to modify and progress therapeutic interventions, address functional mobility deficits, address ROM deficits, address strength deficits, analyze and address soft  tissue restrictions, analyze and cue movement patterns, analyze and modify body mechanics/ergonomics, assess and modify postural abnormalities and address imbalance/dizziness to attain remaining goals.  ????   See Plan of Care   See progress note/recertification   See Discharge Summary  ????  Progress towards goals / Updated goals:  Short Term Goals: To be accomplished in 3 weeks:   1) Goal: Pt's right groin pain will decrease to 3/10 at worst in order to better tolerate all ADLs and lay prone for at least 5 minutes.  Status at initial evaluation: pain 10/10 at worst  Current Status: not reassessed  ????  2) Goal: Innominate alignment will remain symmetric and stable in order to decrease pain and improve tolerance for ambulation and ADLs.  Status at initial evaluation: posteriorly rotated right innominate and left innominate upslip  Current Status: met 02/01/16  ????  3) Goal: Pt will be able to lay in prone position in order to sleep in various positions and walk with symmetric step length bilaterally  Status at initial evaluation: painful in prone position  Current Status: not reassessed  ????  4) Goal: Right LE radicular symptoms will be centralized to the low back in order to demonstrate effectiveness of directional preference exercises and decrease risk of LE dysfunction.  Status at initial evaluation: right groin pain, ache right anterior and medial thigh, sharp pain and crawling paresthesias in the right lower leg to foot, weakness right LE with occassional buckling of knee   Current Status: not reassessed  ????  5) Goal: Pt will be independent and compliant with HEP to achieve other  goals.  Status at initial evaluation: pt is not independent with exercises  Current Status: not reassessed  ????  Long Term Goals: To be accomplished in 6 weeks:   1) Goal: Abolish LBP and radicular symptoms in order to return to normal function.  Status at initial evaluation: pain 10/10 at worst in right groin and right LE to foot    Current Status: not reassessed  ????  2) Goal: Pt will be able to demonstrate ability to self manage LBP and right LE pain to 1/10 at worst during all activities in order to return to normal function.  Status at initial evaluation: pain 10/10 at worst in right groin and right LE to foot  Current Status: not reassessed     PLAN    Upgrade activities as tolerated       Continue plan of care    Update interventions per flow sheet         Discharge due to:_    Other:_      Verne Spurr, PT 02/01/2016  3:45 PM    Future Appointments  Date Time Provider [place]   02/04/2016 3:30 PM Verne Spurr, PT [facility]   02/06/2016 3:30 PM Verne Spurr, PT [facility]   02/07/2016 4:00 PM [facility] PT [facility]CTORY 2 [facility]   02/11/2016 3:30 PM Verne Spurr, PT [facility]   02/14/2016 4:00 PM [facility] PT [facility]CTORY 2 [facility]   02/15/2016 2:30 PM Verne Spurr, PT [facility]   02/19/2016 2:30 PM Arbutus Leas, PT [facility]   02/20/2016 4:00 PM [facility] PT VICTORY 2 [facility]   02/22/2016 2:00 PM Verne Spurr, PT [facility]

## 2016-02-04 ENCOUNTER — Inpatient Hospital Stay: Admit: 2016-02-04 | Discharge: 2016-02-04 | Payer: TRICARE (CHAMPUS) | Primary: Family Medicine

## 2016-02-04 NOTE — Progress Notes (Signed)
PT DAILY TREATMENT NOTE 12-16    Patient Name: Molly [name]  Date:02/04/2016  DOB: [birth year]    Patient DOB Verified  Payor: TRICARE / Plan: [facility] TRICARE RETIREES AND DEPENDENTS / Product Type: Tricare /    In time:3:32  Out time:4:20  Total Treatment Time (min): 48  Visit #: 4 of 8    Treatment Area: Low back pain [M54.5]  Unilateral primary osteoarthritis, unspecified hip [M16.10]    SUBJECTIVE  Pain Level (0-10 scale): 5/10  Any medication changes, allergies to medications, adverse drug reactions, diagnosis change, or new procedure performed?:  No     Yes (see summary sheet for update)  Subjective functional status/changes:    No changes reported  "I'm about 30% better."    OBJECTIVE    Modality rationale:    Min Type Additional Details     Estim:  Unatt       IFC  Premod                        Other:  w/ice   w/heat  Position:  Location:     Estim: Att    TENS instruct  NMES                    Other:  w/US   w/ice   w/heat  Position:  Location:      Traction:  Cervical       Lumbar                        Prone          Supine                       Intermittent   Continuous Lbs:   before manual   after manual      Ultrasound: Continuous    Pulsed                           1MHz   3MHz W/cm2:  Location:      Iontophoresis with dexamethasone         Location:  Take home patch    In clinic      Ice       heat    Ice massage    Laser     Anodyne Position:  Location:      Laser with stim    Other:  Position:  Location:      Vasopneumatic Device Pressure:        lo  med  hi   Temperature:  lo  med  hi    Skin assessment post-treatment:  intact redness- no adverse reaction    redness ??? adverse reaction:      min Eval                  Re-Eval       33 min Therapeutic Exercise:   See flow sheet :   Rationale: decrease pain and increase ROM to improve the patient???s ability to lay supine and tolerate positions to sleep.     min Therapeutic Activity:    See flow sheet :          min Neuromuscular Re-education:    See flow sheet :       15 min Manual Therapy:    PROM right hip flexor stretches in left  side lying, assessed PROM   Rationale: decrease pain, increase ROM and increase tissue extensibility to tolerate supine lying and stand up straight.     min Gait Training:  ___ feet with ___ device on level surfaces with ___ level of assist   Rationale:          With    TE    TA    neuro    other: Patient Education:  Review HEP     Progressed/Changed HEP based on:    positioning    body mechanics    transfers    heat/ice application     other:      Other Objective/Functional Measures:   PROM hip extension: 4 degrees (improved 24 degrees)  Innominates aligned.     Pain Level (0-10 scale) post treatment: 1.5/10    ASSESSMENT/Changes in Function:    Innominates aligned and stable.  Hip flexor tightness resolving.    Patient will continue to benefit from skilled PT services to modify and progress therapeutic interventions, address functional mobility deficits, address ROM deficits, address strength deficits, analyze and address soft tissue restrictions, analyze and cue movement patterns, analyze and modify body mechanics/ergonomics, assess and modify postural abnormalities and address imbalance/dizziness to attain remaining goals.       See Plan of Care    See progress note/recertification    See Discharge Summary         Progress towards goals / Updated goals:  Short Term Goals: To be accomplished in 3 weeks:   1) Goal: Pt's right groin pain will decrease to 3/10 at worst in order to better tolerate all ADLs and lay prone for at least 5 minutes.  Status at initial evaluation: pain 10/10 at worst  Current Status: not reassessed  ????  2) Goal: Innominate alignment will remain symmetric and stable in order to decrease pain and improve tolerance for ambulation and ADLs.  Status at initial evaluation: posteriorly rotated right innominate and left innominate upslip  Current Status: met 02/01/16, 02/04/16   ????  3) Goal: Pt will be able to lay in prone position in order to sleep in various positions and walk with symmetric step length bilaterally  Status at initial evaluation: painful in prone position  Current Status: not reassessed  ????  4) Goal: Right LE radicular symptoms will be centralized to the low back in order to demonstrate effectiveness of directional preference exercises and decrease risk of LE dysfunction.  Status at initial evaluation: right groin pain, ache right anterior and medial thigh, sharp pain and crawling paresthesias in the right lower leg to foot, weakness right LE with occassional buckling of knee   Current Status: not reassessed  ????  5) Goal: Pt will be independent and compliant with HEP to achieve other goals.  Status at initial evaluation: pt is not independent with exercises  Current Status: not reassessed  ????  Long Term Goals: To be accomplished in 6 weeks:   1) Goal: Abolish LBP and radicular symptoms in order to return to normal function.  Status at initial evaluation: pain 10/10 at worst in right groin and right LE to foot   Current Status: not reassessed  ????  2) Goal: Pt will be able to demonstrate ability to self manage LBP and right LE pain to 1/10 at worst during all activities in order to return to normal function.  Status at initial evaluation: pain 10/10 at worst in right groin and right LE to foot  Current Status: not  reassessed     PLAN    Upgrade activities as tolerated       Continue plan of care    Update interventions per flow sheet         Discharge due to:_    Other:_      Verne Spurr, PT 02/04/2016  4:20 PM    Future Appointments  Date Time Provider [place]   02/06/2016 3:30 PM Verne Spurr, PT [facility]   02/07/2016 4:00 PM [facility]   02/11/2016 3:30 PM Verne Spurr, PT [facility]   02/14/2016 4:00 PM [facility]   02/15/2016 2:30 PM Verne Spurr, PT [facility]   02/19/2016 2:30 PM Arbutus Leas, PT [facility]    02/20/2016 4:00 PM [facility]   02/22/2016 2:00 PM Verne Spurr, PT [facility]

## 2016-02-06 ENCOUNTER — Encounter: Payer: TRICARE (CHAMPUS) | Primary: Family Medicine

## 2016-02-07 ENCOUNTER — Inpatient Hospital Stay: Admit: 2016-02-07 | Payer: TRICARE (CHAMPUS) | Primary: Family Medicine

## 2016-02-07 DIAGNOSIS — N3946 Mixed incontinence: Secondary | ICD-10-CM

## 2016-02-07 NOTE — Progress Notes (Addendum)
PF DAILY TREATMENT NOTE 3-16    Patient Name: Molly [name]  Date:02/07/2016  DOB: 04-Mar-1956    Patient DOB Verified  Payor: TRICARE / Plan: [facility] [employer] RETIREES AND DEPENDENTS / Product Type: Tricare /    In time:04:00  Out time:04:55  Total Treatment Time (min): 50  Visit #: 6 of 8    Treatment Area:  Pelvic Floor      Other:    SUBJECTIVE  Pain Level (0-10 scale): 3/10  Any medication changes, allergies to medications, adverse drug reactions, diagnosis change, or new procedure performed?:  No     Yes (see summary sheet for update)  Subjective functional status/changes:    No changes reported  Patient reports her hip has been hurting more especially in groin region. She has more of an antalgic gait today.     OBJECTIVE    10 min Therapeutic Exercise:   See flow sheet :     Pelvic floor strengthening                   Pelvic floor downtraining    Quality pelvic floor contractions         Relaxation techniques    Urge suppression exercises    Other:Stretches of adductors and gluteal region  Rationale: increase ROM  to improve the patient???s ability to tolerate ambulation     40 min Manual Therapy:  MFR/trigger point release to puborectalis and obturator internus focusing on problem side R>L with noted increased time required for tissue release. Manual stretching of R hip to increase hip external rotation/flexion   Rationale: decrease pain, increase ROM, increase tissue extensibility and decrease trigger points to increase ability to perform functional activities such as household chores    Other Objective/Functional Measures:       Pain Level (0-10 scale) post treatment: 1/10    ASSESSMENT/Changes in Function:   Patient reported relief following treatment and release noted with pelvic floor muscles however increased time required. Obturator internus noted with increased tone and TTP during treatment. Patient was able to demonstrate stretches to further mobilize tissue treated today.       Decrease # of leaks     No change   Improving  Resolved       Decrease hypertonus  No change   Improving  Resolved       Increase void interval  No change   Improving  Resolved       Increase PF strength  No change   Improving  Resolved       Increase PF endurance  No change   Improving  Resolved       Increase endurance  No change   Improving  Resolved       Decrease # of pads  No change   Improving  Resolved       Decrease pain  No change   Improving  Resolved       Increased coordination  No change   Improving  Resolved       Increased Bowel Frequency  No change   Improving  Resolved       Patient will continue to benefit from skilled PT services to modify and progress therapeutic interventions, address functional mobility deficits, address ROM deficits, address strength deficits and analyze and address soft tissue restrictions to attain remaining goals.       See Plan of Care    See progress note/recertification    See Discharge Summary  Progress towards goals / Updated goals:  Short Term Goals: To be accomplished in 4 weeks:  1. Patient will demonstrate accurate performance of home exercise program/pelvic floor contractions as adjunct to physical therapy clinic visits to promote healthy lifestyle and improved quality of life.  Status @ Eval:Instructed in HEP to address deficits  Current: Progress HEP to include stretches  2. Patient will Complete Bladder Diary for use in patient education and goal setting.  Status @ Eval:Pt issued bladder Dairy for completion due to Mixed Urinary Incontinence  Current: N/A  ????  Long Term Goals: To be accomplished in 8 weeks:1. Patient will have decreased leakage episodes by 75% or better for increased quality of life.  Status @ Eval: 2-3 leakages episodes average/day  Current: No change reported  2. Patient will have decreased pad usage to </= 1/day increased patient comfort and quality of life.  Status @ Eval:3-4 pantyliners/day used   Current: Patient reports similar amount of panty liners however mostly to maintain dryness with decreased reported leakage  3. Patient will have consistent normal void spacing of 2-4 hr for more normal function and increased quality of life.  Status @ Eval: Void interval of about 2 hours. Goal MET    PLAN    Upgrade activities as tolerated       Continue plan of care    Update interventions per flow sheet         Discharge due to:_    Other:_      Tona Sensing 02/07/2016  4:06 PM    Future Appointments  Date Time Provider [place]   02/08/2016 8:00 AM Verne Spurr, PT [facility]   02/11/2016 3:30 PM Verne Spurr, PT [facility]   02/14/2016 4:00 PM [place] [facility]   02/15/2016 2:30 PM Verne Spurr, PT [facility]   02/19/2016 2:30 PM Arbutus Leas, PT [facility]   02/20/2016 4:00 PM [place] [facility]   02/22/2016 2:00 PM Verne Spurr, PT [facility]

## 2016-02-08 ENCOUNTER — Inpatient Hospital Stay: Admit: 2016-02-08 | Discharge: 2016-02-08 | Payer: TRICARE (CHAMPUS) | Primary: Family Medicine

## 2016-02-08 NOTE — Progress Notes (Signed)
PT DAILY TREATMENT NOTE 12-16    Patient Name: Molly [name]  Date:02/08/2016  DOB: [birth year]    Patient DOB Verified  Payor: TRICARE / Plan: [facility] [employer] RETIREES AND DEPENDENTS / Product Type: Tricare /    In time:8:03  Out time:8:33  Total Treatment Time (min): 30  Visit #: 5 of 8    Treatment Area: Low back pain [M54.5]  Unilateral primary osteoarthritis, unspecified hip [M16.10]    SUBJECTIVE  Pain Level (0-10 scale): 2/10  Any medication changes, allergies to medications, adverse drug reactions, diagnosis change, or new procedure performed?:  No     Yes (see summary sheet for update)  Subjective functional status/changes:    No changes reported  "I was feeling better until this morning when I was doing my knee to chest exercises then my back pain started again."    OBJECTIVE    Modality rationale:    Min Type Additional Details     Estim:  Unatt       IFC  Premod                        Other:  w/ice   w/heat  Position:  Location:     Estim: Att    TENS instruct  NMES                    Other:  w/US   w/ice   w/heat  Position:  Location:      Traction:  Cervical       Lumbar                        Prone          Supine                       Intermittent   Continuous Lbs:   before manual   after manual      Ultrasound: Continuous    Pulsed                           1MHz   3MHz W/cm2:  Location:      Iontophoresis with dexamethasone         Location:  Take home patch    In clinic      Ice       heat    Ice massage    Laser     Anodyne Position:  Location:      Laser with stim    Other:  Position:  Location:      Vasopneumatic Device Pressure:        lo  med  hi   Temperature:  lo  med  hi    Skin assessment post-treatment:  intact redness- no adverse reaction    redness ??? adverse reaction:      min Eval                  Re-Eval        min Therapeutic Exercise:   See flow sheet :  Added self METs to correct posteriorly rotated right innominate        min Therapeutic Activity:    See flow sheet :         15 min Neuromuscular Re-education:    See flow sheet :   Rationale: increase strength, improve coordination and increase proprioception  to improve the patient???s ability to normalize ADLs.    15 min Manual Therapy:    METs to correct posteriorly rotated right innominate    Rationale: decrease pain and correct jpoint alignment and joint mechanics to tolerate positions and ADLs.     min Gait Training:  ___ feet with ___ device on level surfaces with ___ level of assist   Rationale:          With    TE    TA    neuro    other: Patient Education:  Review HEP     Progressed/Changed HEP based on:    positioning    body mechanics    transfers    heat/ice application     other:      Other Objective/Functional Measures:  left LE shortens in sitting- posteriorly rotated right innominate     Pain Level (0-10 scale) post treatment: 0.5/10    ASSESSMENT/Changes in Function:    Pt's innominates required correction today, but self METs were able to correct innominate alignment.    Patient will continue to benefit from skilled PT services to modify and progress therapeutic interventions, address functional mobility deficits, address ROM deficits, address strength deficits, analyze and address soft tissue restrictions, analyze and cue movement patterns, analyze and modify body mechanics/ergonomics, assess and modify postural abnormalities and address imbalance/dizziness to attain remaining goals.  ????   See Plan of Care   See progress note/recertification   See Discharge Summary  ????  Progress towards goals / Updated goals:  Short Term Goals: To be accomplished in 3 weeks:   1) Goal: Pt's right groin pain will decrease to 3/10 at worst in order to better tolerate all ADLs and lay prone for at least 5 minutes.  Status at initial evaluation: pain 10/10 at worst  Current Status: not reassessed  ????  2) Goal: Innominate alignment will remain symmetric and stable in order to decrease pain and improve tolerance for ambulation and ADLs.   Status at initial evaluation: posteriorly rotated right innominate and left innominate upslip  Current Status: not met 02/08/16  ????  3) Goal: Pt will be able to lay in prone position in order to sleep in various positions and walk with symmetric step length bilaterally  Status at initial evaluation: painful in prone position  Current Status: not reassessed  ????  4) Goal: Right LE radicular symptoms will be centralized to the low back in order to demonstrate effectiveness of directional preference exercises and decrease risk of LE dysfunction.  Status at initial evaluation: right groin pain, ache right anterior and medial thigh, sharp pain and crawling paresthesias in the right lower leg to foot, weakness right LE with occassional buckling of knee   Current Status: not reassessed  ????  5) Goal: Pt will be independent and compliant with HEP to achieve other goals.  Status at initial evaluation: pt is not independent with exercises  Current Status: not reassessed  ????  Long Term Goals: To be accomplished in 6 weeks:   1) Goal: Abolish LBP and radicular symptoms in order to return to normal function.  Status at initial evaluation: pain 10/10 at worst in right groin and right LE to foot   Current Status: not reassessed  ????  2) Goal: Pt will be able to demonstrate ability to self manage LBP and right LE pain to 1/10 at worst during all activities in order to return to normal function.  Status at initial evaluation: pain 10/10 at  worst in right groin and right LE to foot  Current Status: not reassessed   PLAN    Upgrade activities as tolerated       Continue plan of care    Update interventions per flow sheet         Discharge due to:_    Other:_      Verne Spurr, PT 02/08/2016  8:06 AM    Future Appointments  Date Time Provider [place]   02/11/2016 3:30 PM Verne Spurr, PT [facility]   02/14/2016 4:00 PM [place] [facility]   02/15/2016 2:30 PM Verne Spurr, PT [facility]    02/19/2016 2:30 PM Arbutus Leas, PT [facility]   02/20/2016 4:00 PM [place]   02/22/2016 2:00 PM Verne Spurr, PT [facility]

## 2016-02-11 ENCOUNTER — Inpatient Hospital Stay: Admit: 2016-02-11 | Discharge: 2016-02-11 | Payer: TRICARE (CHAMPUS) | Primary: Family Medicine

## 2016-02-11 NOTE — Progress Notes (Signed)
PT DAILY TREATMENT NOTE 12-16    Patient Name: Molly [name]  Date:02/11/2016  DOB: 29-Jan-1956    Patient DOB Verified  Payor: TRICARE / Plan: [facility] [employer] RETIREES AND DEPENDENTS / Product Type: Tricare /    In time:3:35  Out time:4:27  Total Treatment Time (min): 28  Visit #: 6 of 8    Treatment Area: Low back pain [M54.5]  Unilateral primary osteoarthritis, unspecified hip [M16.10]    SUBJECTIVE  Pain Level (0-10 scale): 1/10  Any medication changes, allergies to medications, adverse drug reactions, diagnosis change, or new procedure performed?:  No     Yes (see summary sheet for update)  Subjective functional status/changes:    No changes reported  "I'm 40% better."    OBJECTIVE    Modality rationale:    Min Type Additional Details     Estim:  Unatt       IFC  Premod                        Other:  w/ice   w/heat  Position:  Location:     Estim: Att    TENS instruct  NMES                    Other:  w/US   w/ice   w/heat  Position:  Location:      Traction:  Cervical       Lumbar                        Prone          Supine                       Intermittent   Continuous Lbs:   before manual   after manual      Ultrasound: Continuous    Pulsed                           1MHz   3MHz W/cm2:  Location:      Iontophoresis with dexamethasone         Location:  Take home patch    In clinic      Ice       heat    Ice massage    Laser     Anodyne Position:  Location:      Laser with stim    Other:  Position:  Location:      Vasopneumatic Device Pressure:        lo  med  hi   Temperature:  lo  med  hi    Skin assessment post-treatment:  intact redness- no adverse reaction    redness ??? adverse reaction:      min Eval                  Re-Eval       12 min Therapeutic Exercise:   See flow sheet :  Added pelvic tilts   Rationale: increase ROM, increase strength, improve coordination and increase proprioception to improve the patient???s ability to tolerate positions and ADLs.     min Therapeutic Activity:    See flow sheet :         50 min Neuromuscular Re-education:    See flow sheet :  Added half prone hip extensions   Rationale: increase strength, improve coordination and increase proprioception  to improve the patient???s ability to normalize ADLs.     min Manual Therapy:          min Gait Training:  ___ feet with ___ device on level surfaces with ___ level of assist   Rationale:          With    TE    TA    neuro    other: Patient Education:  Review HEP     Progressed/Changed HEP based on:    positioning    body mechanics    transfers    heat/ice application     other:      Other Objective/Functional Measures:   Innominates aligned.     Pain Level (0-10 scale) post treatment: 0.5/10    ASSESSMENT/Changes in Function:    Innominates remaining aligned and stable.  Hip flexors contracture continues to decrease allowing pt to lay supine with hip flexion.    Patient will continue to benefit from skilled PT services to modify and progress therapeutic interventions, address functional mobility deficits, address ROM deficits, address strength deficits, analyze and address soft tissue restrictions, analyze and cue movement patterns, analyze and modify body mechanics/ergonomics, assess and modify postural abnormalities and address imbalance/dizziness to attain remaining goals.  ????   See Plan of Care   See progress note/recertification   See Discharge Summary  ????  Progress towards goals / Updated goals:  Short Term Goals: To be accomplished in 3 weeks:   1) Goal: Pt's right groin pain will decrease to 3/10 at worst in order to better tolerate all ADLs and lay prone for at least 5 minutes.  Status at initial evaluation: pain 10/10 at worst  Current Status: not reassessed  ????  2) Goal: Innominate alignment will remain symmetric and stable in order to decrease pain and improve tolerance for ambulation and ADLs.  Status at initial evaluation: posteriorly rotated right innominate and left innominate upslip  Current Status: met 02/11/16  ????   3) Goal: Pt will be able to lay in prone position in order to sleep in various positions and walk with symmetric step length bilaterally  Status at initial evaluation: painful in prone position  Current Status: not reassessed  ????  4) Goal: Right LE radicular symptoms will be centralized to the low back in order to demonstrate effectiveness of directional preference exercises and decrease risk of LE dysfunction.  Status at initial evaluation: right groin pain, ache right anterior and medial thigh, sharp pain and crawling paresthesias in the right lower leg to foot, weakness right LE with occassional buckling of knee   Current Status: not reassessed  ????  5) Goal: Pt will be independent and compliant with HEP to achieve other goals.  Status at initial evaluation: pt is not independent with exercises  Current Status: not reassessed  ????  Long Term Goals: To be accomplished in 6 weeks:   1) Goal: Abolish LBP and radicular symptoms in order to return to normal function.  Status at initial evaluation: pain 10/10 at worst in right groin and right LE to foot   Current Status: not reassessed  ????  2) Goal: Pt will be able to demonstrate ability to self manage LBP and right LE pain to 1/10 at worst during all activities in order to return to normal function.  Status at initial evaluation: pain 10/10 at worst in right groin and right LE to foot  Current Status: not reassessed    PLAN    Upgrade activities as  tolerated       Continue plan of care    Update interventions per flow sheet         Discharge due to:_    Other:_      Verne Spurr, PT 02/11/2016  4:37 PM    Future Appointments  Date Time Provider [place]   02/13/2016 2:30 PM Verne Spurr, PT [facility]   02/14/2016 4:00 PM [place] [facility]   02/19/2016 2:30 PM Arbutus Leas, PT [facility]   02/20/2016 4:00 PM [place] [facility]   02/22/2016 2:00 PM Verne Spurr, PT [facility]

## 2016-02-13 ENCOUNTER — Encounter: Payer: TRICARE (CHAMPUS) | Primary: Family Medicine

## 2016-02-13 ENCOUNTER — Encounter: Primary: Family Medicine

## 2016-02-14 ENCOUNTER — Inpatient Hospital Stay: Admit: 2016-02-14 | Payer: TRICARE (CHAMPUS) | Primary: Family Medicine

## 2016-02-14 NOTE — Progress Notes (Addendum)
PF DAILY TREATMENT NOTE 3-16    Patient Name: Molly [name]  Date:02/14/2016  DOB: Nov 22, 1956    Patient DOB Verified  Payor: TRICARE / Plan: [facility] [employer] RETIREES AND DEPENDENTS / Product Type: Tricare /    In time:4:00  Out time:04:48  Total Treatment Time (min): 40  Visit #: 7 of 8    Treatment Area:  Pelvic Floor      Other:    SUBJECTIVE  Pain Level (0-10 scale): 2/10  Any medication changes, allergies to medications, adverse drug reactions, diagnosis change, or new procedure performed?:  No     Yes (see summary sheet for update)  Subjective functional status/changes:    No changes reported  Patient reports leakage episodes and urgency has improved. Her hip and pain has improved with combination of therapy for PF and back since last visit. Has noticed difficulty with consistency using PFM contraction to inhibit initial urgency in AM.     OBJECTIVE    40 min Manual Therapy:  MFR/trigger point release puborectalis and obturator internus witih   Rationale: decrease pain, increase ROM, increase tissue extensibility and decrease trigger points to decrease pain walking without cane          With    TE    TA    neuro   manual    other: Patient Education:  Review HEP     Progressed/Changed HEP based on:    positioning    body mechanics    transfers    heat/ice application     other:      Other Objective/Functional Measures:   PFM MMT: 4/5    Pain Level (0-10 scale) post treatment: 0/10    ASSESSMENT/Changes in Function:   Patient progressing overall with urinary symptoms reporting decreased leakage episodes to 1x/day with initial urgency in AM however inconsistent. Her constipation has improved when she is compliant with dietary restrictions recommended for patient. Increase tissue restriction noted following manual treatment.       Decrease # of leaks    No change   Improving  Resolved       Decrease hypertonus  No change   Improving  Resolved       Increase void interval  No change   Improving  Resolved        Increase PF strength  No change   Improving  Resolved       Increase PF endurance  No change   Improving  Resolved       Increase endurance  No change   Improving  Resolved       Decrease # of pads  No change   Improving  Resolved       Decrease pain  No change   Improving  Resolved       Increased coordination  No change   Improving  Resolved       Increased Bowel Frequency  No change   Improving  Resolved       Patient will continue to benefit from skilled PT services to modify and progress therapeutic interventions, address functional mobility deficits, address ROM deficits, address strength deficits and analyze and address soft tissue restrictions to attain remaining goals.       See Plan of Care    See progress note/recertification    See Discharge Summary         Progress towards goals / Updated goals:  Short Term Goals: To be accomplished in 4 weeks:  1. Patient will  demonstrate accurate performance of home exercise program/pelvic floor contractions as adjunct to physical therapy clinic visits to promote healthy lifestyle and improved quality of life.  Status @ Eval:Instructed in HEP to address deficits  Current: Patient reports she is compliant  2. Patient will Complete Bladder Diary for use in patient education and goal setting.  Status @ Eval:Pt issued bladder Dairy for completion due to Mixed Urinary Incontinence  Current: N/A  ????  Long Term Goals: To be accomplished in 8 weeks:1. Patient will have decreased leakage episodes by 75% or better for increased quality of life.  Status @ Eval: 2-3 leakages episodes average/day  Current: 1/xday  2. Patient will have decreased pad usage to </= 1/day increased patient comfort and quality of life.  Status @ Eval:3-4 pantyliners/day used  Current: not assessed    ??    PLAN    Upgrade activities as tolerated       Continue plan of care    Update interventions per flow sheet         Discharge due to:_    Other:_      Nonnie Done 02/14/2016  4:06 PM     Future Appointments  Date Time Provider Department Center   02/19/2016 2:30 PM Veda Canning, PT [facility]   02/22/2016 2:00 PM Veda Canning, PT [facility]   02/28/2016 10:00 AM Nonnie Done [facility]

## 2016-02-15 ENCOUNTER — Encounter: Payer: TRICARE (CHAMPUS) | Primary: Family Medicine

## 2016-02-18 ENCOUNTER — Encounter: Payer: TRICARE (CHAMPUS) | Primary: Family Medicine

## 2016-02-19 ENCOUNTER — Inpatient Hospital Stay
Admit: 2016-02-19 | Payer: TRICARE (CHAMPUS) | Attending: Rehabilitative and Restorative Service Providers" | Primary: Family Medicine

## 2016-02-19 NOTE — Progress Notes (Signed)
PT DAILY TREATMENT NOTE 12-16    Patient Name: Molly [name]  Date:02/19/2016  DOB: May 05, 1956    Patient DOB Verified  Payor: TRICARE / Plan: [facility] [employer] RETIREES AND DEPENDENTS / Product Type: Tricare /    In time:2:37  Out time: 3:10  Total Treatment Time (min): 43  Visit #: 7 of 8    Treatment Area: Low back pain [M54.5]  Unilateral primary osteoarthritis, unspecified hip [M16.10]    SUBJECTIVE  Pain Level (0-10 scale): 4  Any medication changes, allergies to medications, adverse drug reactions, diagnosis change, or new procedure performed?:  No     Yes (see summary sheet for update)  Subjective functional status/changes:    No changes reported  I feel more tightness in my hip. I think its improving some ~ 25%    OBJECTIVE    Modality rationale: decrease edema, decrease inflammation, decrease pain, increase tissue extensibility and increase muscle contraction/control to improve the patient???s ability to improve mobility    Min Type Additional Details     Estim:  Unatt       IFC  Premod                        Other:  w/ice   w/heat  Position:  Location:     Estim: Att    TENS instruct  NMES                    Other:  w/US   w/ice   w/heat  Position:  Location:      Traction:  Cervical       Lumbar                        Prone          Supine                       Intermittent   Continuous Lbs:   before manual   after manual      Ultrasound: Continuous    Pulsed                           1MHz   3MHz W/cm2:  Location:      Iontophoresis with dexamethasone         Location:  Take home patch    In clinic   10   Ice       heat    Ice massage    Laser     Anodyne Position: s/l   Location: right hip and groin       Laser with stim    Other:  Position:  Location:      Vasopneumatic Device Pressure:        lo  med  hi   Temperature:  lo  med  hi    Skin assessment post-treatment:  intact redness- no adverse reaction    redness ??? adverse reaction:       25 min Therapeutic Exercise:   See flow sheet :    Rationale: increase ROM, increase strength, improve coordination, improve balance and increase proprioception to improve the patient???s ability to improve mobility     8 min Manual Therapy:  MFR to Psoas, Pectenius and Glut med.    Rationale: decrease pain, increase ROM and decrease trigger points to improve mobility  With    TE    TA    neuro    other: Patient Education:  Review HEP     Progressed/Changed HEP based on:    positioning    body mechanics    transfers    heat/ice application     other:      Other Objective/Functional Measures: psoas and pectineus with moderate spasms on right side. Pt has moderate tightness of TFL and Glut med.      Pain Level (0-10 scale) post treatment: 3    ASSESSMENT/Changes in Function: Pt has MD apt coming up soon to discuss hip pathology and determine is sx is the option for hip or to continue with PT. PT to reassess next session. Pt does present with moderate soft tissue restrictions, SI remained level.     Patient will continue to benefit from skilled PT services to modify and progress therapeutic interventions, address functional mobility deficits, address ROM deficits, address strength deficits, analyze and address soft tissue restrictions, analyze and cue movement patterns, analyze and modify body mechanics/ergonomics, assess and modify postural abnormalities and address imbalance/dizziness to attain remaining goals.       See Plan of Care    See progress note/recertification    See Discharge Summary         Progress towards goals / Updated goals:  Short Term Goals: To be accomplished in 3 weeks:   1) Goal: Pt's right groin pain will decrease to 3/10 at worst in order to better tolerate all ADLs and lay prone for at least 5 minutes.  Status at initial evaluation: pain 10/10 at worst  Current Status: not met, 4-6/10 at worst ????    2) Goal: Innominate alignment will remain symmetric and stable in order to decrease pain and improve tolerance for ambulation and ADLs.   Status at initial evaluation: posteriorly rotated right innominate and left innominate upslip  Current Status: goal met 02/19/16  ????  3) Goal: Pt will be able to lay in prone position in order to sleep in various positions and walk with symmetric step length bilaterally  Status at initial evaluation: painful in prone position  Current Status: not reassessed  ????  4) Goal: Right LE radicular symptoms will be centralized to the low back in order to demonstrate effectiveness of directional preference exercises and decrease risk of LE dysfunction.  Status at initial evaluation: right groin pain, ache right anterior and medial thigh, sharp pain and crawling paresthesias in the right lower leg to foot, weakness right LE with occassional buckling of knee   Current Status: not met, improving, radiates to buttocks region on right side.   ????  5) Goal: Pt will be independent and compliant with HEP to achieve other goals.  Status at initial evaluation: pt is not independent with exercises  Current Status: goal met, I and compliant with HEP. ????    Long Term Goals: To be accomplished in 6 weeks:   1) Goal: Abolish LBP and radicular symptoms in order to return to normal function.  Status at initial evaluation: pain 10/10 at worst in right groin and right LE to foot   Current Status: not met, 4-6/10 at worst.   ????  2) Goal: Pt will be able to demonstrate ability to self manage LBP and right LE pain to 1/10 at worst during all activities in order to return to normal function.  Status at initial evaluation: pain 10/10 at worst in right groin and right LE to foot  Current Status:  Not met, 3-6/10 pain at worst. ??    PLAN    Upgrade activities as tolerated       Continue plan of care    Update interventions per flow sheet         Discharge due to:_    Other:_      Arbutus Leas, PT 02/19/2016  3:06 PM    Future Appointments  Date Time Provider [place]   02/22/2016 2:00 PM Arbutus Leas, PT [facility]    02/28/2016 10:00 AM [facility]   03/03/2016 3:00 PM [place] [facility]   03/11/2016 3:00 PM [place] [facility]

## 2016-02-20 ENCOUNTER — Encounter: Payer: TRICARE (CHAMPUS) | Primary: Family Medicine

## 2016-02-22 ENCOUNTER — Inpatient Hospital Stay
Admit: 2016-02-22 | Payer: TRICARE (CHAMPUS) | Attending: Rehabilitative and Restorative Service Providers" | Primary: Family Medicine

## 2016-02-22 NOTE — Progress Notes (Signed)
PT DAILY TREATMENT NOTE 12-16    Patient Name: Molly [name]  Date:02/22/2016  DOB: 01-27-1956    Patient DOB Verified  Payor: TRICARE / Plan: [facility] [employer] RETIREES AND DEPENDENTS / Product Type: Tricare /    In time: 2:00  Out time: 3:02  Total Treatment Time (min): 60  Visit #: 8 of 8    Treatment Area: Low back pain [M54.5]  Unilateral primary osteoarthritis, unspecified hip [M16.10]    SUBJECTIVE  Pain Level (0-10 scale): 1  Any medication changes, allergies to medications, adverse drug reactions, diagnosis change, or new procedure performed?:  No     Yes (see summary sheet for update)  Subjective functional status/changes:    No changes reported  I feel I have made improvements. The radiating pain is decreased  By 75%. I can tell the therapy is helping. I talked with the MD and he would like me to continue.     OBJECTIVE    Modality rationale: decrease pain and increase tissue extensibility to improve the patient???s ability to improve mobility    Min Type Additional Details     Estim:  Unatt       IFC  Premod                        Other:  w/ice   w/heat  Position:  Location:     Estim: Att    TENS instruct  NMES                    Other:  w/US   w/ice   w/heat  Position:  Location:      Traction:  Cervical       Lumbar                        Prone          Supine                       Intermittent   Continuous Lbs:   before manual   after manual      Ultrasound: Continuous    Pulsed                           1MHz   3MHz W/cm2:  Location:      Iontophoresis with dexamethasone         Location:  Take home patch    In clinic   10   Ice       heat    Ice massage    Laser     Anodyne Position:s/l    Location: right hip      Laser with stim    Other:  Position:  Location:      Vasopneumatic Device Pressure:        lo  med  hi   Temperature:  lo  med  hi    Skin assessment post-treatment:  intact redness- no adverse reaction    redness ??? adverse reaction:     30 min Therapeutic Exercise:   See flow sheet :    Rationale: increase ROM, increase strength, improve coordination, improve balance and increase proprioception to improve the patient???s ability to improve mobility      10 min Neuromuscular Re-education:    See flow sheet :   Rationale: increase ROM, increase strength, improve coordination, improve balance and  increase proprioception  to improve the patient???s ability to improve mobility     8 min Manual Therapy:  MFR to psoas, Pectineus,    Rationale: decrease pain, increase ROM, increase tissue extensibility and decrease trigger points to improve mobility           With    TE    TA    neuro    other: Patient Education:  Review HEP     Progressed/Changed HEP based on:    positioning    body mechanics    transfers    heat/ice application     other:      Other Objective/Functional Measures: see PN      Pain Level (0-10 scale) post treatment: 0    ASSESSMENT/Changes in Function: See PN. Pt making good progress, however soft tissue restrictions are still present and pt has weakness in LE.     Patient will continue to benefit from skilled PT services to modify and progress therapeutic interventions, address functional mobility deficits, address ROM deficits, address strength deficits, analyze and address soft tissue restrictions, analyze and cue movement patterns, analyze and modify body mechanics/ergonomics, assess and modify postural abnormalities and address imbalance/dizziness to attain remaining goals.       See Plan of Care    See progress note/recertification    See Discharge Summary         Progress towards goals / Updated goals:  Short Term Goals: To be accomplished in 3 weeks:   1) Goal: Pt's right groin pain will decrease to 3/10 at worst in order to better tolerate all ADLs and lay prone for at least 5 minutes.  Status at initial evaluation: pain 10/10 at worst  Current Status: not met, 3-4/10 at worst ????  02/22/2016  ??  2) Goal: Innominate alignment will remain symmetric and stable in order to  decrease pain and improve tolerance for ambulation and ADLs.  Status at initial evaluation: posteriorly rotated right innominate and left innominate upslip  Current Status: goal met  02/22/16  ????  3) Goal: Pt will be able to lay in prone position in order to sleep in various positions and walk with symmetric step length bilaterally  Status at initial evaluation: painful in prone position  Current Status: not met, pt still sleeps in recliner to accommodate for pain.   ????  4) Goal: Right LE radicular symptoms will be centralized to the low back in order to demonstrate effectiveness of directional preference exercises and decrease risk of LE dysfunction.  Status at initial evaluation: right groin pain, ache right anterior and medial thigh, sharp pain and crawling paresthesias in the right lower leg to foot, weakness right LE with occassional buckling of knee   Current Status: not met, improving, radiates to buttocks and mid leg region mostly on right side, decreased by 75%.   ????  5) Goal: Pt will be independent and compliant with HEP to achieve other goals.  Status at initial evaluation: pt is not independent with exercises  Current Status: goal met, I and compliant with HEP. ????  ??  Long Term Goals: To be accomplished in 6 weeks:   1) Goal: Abolish LBP and radicular symptoms in order to return to normal function.  Status at initial evaluation: pain 10/10 at worst in right groin and right LE to foot   Current Status: not met, 4-6/10 at worst.   ????  2) Goal: Pt will be able to demonstrate ability to self manage LBP and right  LE pain to 1/10 at worst during all activities in order to return to normal function.  Status at initial evaluation: pain 10/10 at worst in right groin and right LE to foot  Current Status:  Not met, 3-6/10 pain at worst. ??      PLAN    Upgrade activities as tolerated       Continue plan of care    Update interventions per flow sheet         Discharge due to:_    Other:_       Arbutus Leas, PT 02/22/2016  2:40 PM    Future Appointments  Date Time Provider [place]   02/28/2016 10:00 AM [place] [facility]   03/03/2016 3:00 PM [place] [facility]   03/11/2016 3:00 PM [place] [facility]

## 2016-02-22 NOTE — Progress Notes (Signed)
[facility]  [address] 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   Pt has moderate tightness of TFL and Glut med.    SI is level this date.   Left hip flexion: 4+/5 (same), right (same) 4/5 with pain.   SLR: (-) bilaterally.     Short Term Goals: To be accomplished in 3 weeks:   1) Goal: Pt's right groin pain will decrease to 3/10 at worst in order to better tolerate all ADLs and lay prone for at least 5 minutes.  Status at initial evaluation: pain 10/10 at worst  Current Status: not met, 3-4/10 at worst ????  02/22/2016  ??  2) Goal: Innominate alignment will remain symmetric and stable in order to decrease pain and improve tolerance for ambulation and ADLs.  Status at initial evaluation: posteriorly rotated right innominate and left innominate upslip  Current Status: goal met  02/22/16  ????  3) Goal: Pt will be able to lay in prone position in order to sleep in various positions and walk with symmetric step length bilaterally   Status at initial evaluation: painful in prone position  Current Status: not met, pt still sleeps in recliner to accommodate for pain.   ????  4) Goal: Right LE radicular symptoms will be centralized to the low back in order to demonstrate effectiveness of directional preference exercises and decrease risk of LE dysfunction.  Status at initial evaluation: right groin pain, ache right anterior and medial thigh, sharp pain and crawling paresthesias in the right lower leg to foot, weakness right LE with occassional buckling of knee   Current Status: not met, improving, radiates to buttocks and mid leg region mostly on right side, decreased by 75%.   ????  5) Goal: Pt will be independent and compliant with HEP to achieve other goals.  Status at initial evaluation: pt is not independent with exercises  Current Status: goal met, I and compliant with HEP. ????  ??  Long Term Goals: To be accomplished in 6 weeks:   1) Goal: Abolish LBP and radicular symptoms in order to return to normal  function.  Status at initial evaluation: pain 10/10 at worst in right groin and right LE to foot   Current Status: not met, 4/10 at worst.   ????  2) Goal: Pt will be able to demonstrate ability to self manage LBP and right LE pain to 1/10 at worst during all activities in order to return to normal function.  Status at initial evaluation: pain 10/10 at worst in right groin and right LE to foot  Current Status:  Not met, 3-4/10 pain at worst. ??    Updated Goals: to be achieved in 4 weeks:   Continue with unmet goals.   New goal.   ASSESSMENT/RECOMMENDATIONS: Pt is showing steady progression with current POC. Pt has decreased report of radiating pain and localized back and hip pain that is moderate. Pt does still report moderate right groin pain. Pt has maintained a neutral pelvis, however she still presents with moderate to severe soft tissue restrictions throughout those regions of pelvis and  groin and she will continue to benefit from skilled PT services. Pt is unable to sleep in her bed and reports her recliner gives her better relief. PT to implement mechanical traction to alleviated ongoing radiating pain.     Continue therapy per initial plan/protocol at a frequency of  2 x per week for 4 weeks  Continue therapy with the following recommended changes:_____________________      _____________________________________________________________________  Discontinue therapy progressing towards or have reached established goals  Discontinue therapy due to lack of appreciable progress towards goals  Discontinue therapy due to lack of attendance or compliance  Await Physician's recommendations/decisions regarding therapy  Other:________________________________________________________________    Thank you for this referral.   Arbutus Leas, PT 02/22/2016 3:04 PM    NOTE TO PHYSICIAN:  [place] AND   FAX TO [facility]: [phone]  If you are unable to process this request in 24 hours please contact our office: [phone]      ____ I have read the above report and request that my patient continue therapy with the following changes/special instructions:  ____ I have read the above report and request that my patient be discharged from therapy    Physician's Signature:_____________________ Date:___________Time:__________

## 2016-02-27 ENCOUNTER — Inpatient Hospital Stay
Admit: 2016-02-27 | Payer: TRICARE (CHAMPUS) | Attending: Rehabilitative and Restorative Service Providers" | Primary: Family Medicine

## 2016-02-27 DIAGNOSIS — M545 Low back pain: Secondary | ICD-10-CM

## 2016-02-27 NOTE — Progress Notes (Signed)
PT DAILY TREATMENT NOTE 12-16    Patient Name: Molly [name]  Date:02/27/2016  DOB: 1956/01/09    Patient DOB Verified  Payor: TRICARE / Plan: [facility] TRICARE RETIREES AND DEPENDENTS / Product Type: Tricare /    In time:2:00  Out time: 3:05   Total Treatment Time (min): 63  Visit #: 9 of 16     Treatment Area: Low back pain [M54.5]  Unilateral primary osteoarthritis, unspecified hip [M16.10]    SUBJECTIVE  Pain Level (0-10 scale): 1/10   Any medication changes, allergies to medications, adverse drug reactions, diagnosis change, or new procedure performed?:  No     Yes (see summary sheet for update)  Subjective functional status/changes:    No changes reported  "Yesterday was a bad day, but today is better"     OBJECTIVE    Modality rationale: decrease inflammation, decrease pain and increase tissue extensibility to improve the patient???s ability to normalize gait and ADLs    Min Type Additional Details     Estim:  Unatt       IFC  Premod                        Other:  w/ice   w/heat  Position:  Location:     Estim: Att    TENS instruct  NMES                    Other:  w/US   w/ice   w/heat  Position:  Location:      Traction:  Cervical       Lumbar                        Prone          Supine                       Intermittent   Continuous Lbs:   before manual   after manual      Ultrasound: Continuous    Pulsed                           1MHz   3MHz W/cm2:  Location:      Iontophoresis with dexamethasone         Location:  Take home patch    In clinic   10    Ice       heat    Ice massage    Laser     Anodyne Position: Sidelying   Location: Right hi[p       Laser with stim    Other:  Position:  Location:      Vasopneumatic Device Pressure:        lo  med  hi   Temperature:  lo  med  hi    Skin assessment post-treatment:  intact redness- no adverse reaction    redness ??? adverse reaction:      min Eval                  Re-Eval       20 min Therapeutic Exercise:   See flow sheet :    Rationale: increase strength, improve coordination and increase proprioception to improve the patient???s ability to normalize gait and ADLs      min Therapeutic Activity:    See flow sheet :  30 min Neuromuscular Re-education:    See flow sheet : swiss ball used, cues needed for TA bracing and engaging correct muscles, positioning for proper stretch with good posture.    Rationale: increase strength, improve coordination and increase proprioception  to improve the patient???s ability to normalize gait and ADLs     5 min Manual Therapy:  Prone hip flexion stretch    Rationale: increase ROM and increase tissue extensibility to improve patient's ability to normalize gait and ADLs      min Gait Training:  ___ feet with ___ device on level surfaces with ___ level of assist             With    TE    TA    neuro    other: Patient Education:  Review HEP     Progressed/Changed HEP based on:    positioning    body mechanics    transfers    heat/ice application     other:      Other Objective/Functional Measures:   Innominates aligned   Bilateral hips tend towards an externally rotated and abducted position, right >left      Pain Level (0-10 scale) post treatment: 0-1/10     ASSESSMENT/Changes in Function:   Pt able to tolerate session with only mild complaints of right groin discomfort. Innominates were aligned and stable today. Continued with core strengthening and stabilization with and without swiss ball. Pt able to tolerate more aggressive manual hip flexion stretching in prone. Attempted hip adductor stretching, pt unable to tolerate on the right.  Introduced seated piriformis stretch with good tolerance on the left; however, was unable to produce the same stretch on the right.     Patient will continue to benefit from skilled PT services to modify and progress therapeutic interventions, address functional mobility deficits, address ROM deficits, address strength deficits, analyze and address soft  tissue restrictions, analyze and cue movement patterns, analyze and modify body mechanics/ergonomics, assess and modify postural abnormalities and address imbalance/dizziness to attain remaining goals.  ????   See Plan of Care   See progress note/recertification   See Discharge Summary  ????  Progress towards goals / Updated goals:  Short Term Goals: To be accomplished in 3 weeks:   1) Goal: Pt's right groin pain will decrease to 3/10 at worst in order to better tolerate all ADLs and lay prone for at least 5 minutes.  Status at initial evaluation: pain 10/10 at worst  Current Status: not met, 4/10 at worst ???? 02/27/16   ????  2) Goal: Innominate alignment will remain symmetric and stable in order to decrease pain and improve tolerance for ambulation and ADLs.  Status at initial evaluation: posteriorly rotated right innominate and left innominate upslip  Current Status: Innominates aligned 02/27/16   ????  3) Goal: Pt will be able to lay in prone position in order to sleep in various positions and walk with symmetric step length bilaterally  Status at initial evaluation: painful in prone position  Current Status: not met, pt still sleeps in recliner to accommodate for pain. 02/27/16   ????  4) Goal: Right LE radicular symptoms will be centralized to the low back in order to demonstrate effectiveness of directional preference exercises and decrease risk of LE dysfunction.  Status at initial evaluation: right groin pain, ache right anterior and medial thigh, sharp pain and crawling paresthesias in the right lower leg to foot, weakness right LE with occassional buckling of knee  Current Status: not met, improving, radiates to buttocks and mid leg region mostly on right side, decreased by 75%.   ????  5) Goal: Pt will be independent and compliant with HEP to achieve other goals.  Status at initial evaluation: pt is not independent with exercises  Current Status: goal met, I and compliant with HEP. ????  ????   Long Term Goals: To be accomplished in 6 weeks:   1) Goal: Abolish LBP and radicular symptoms in order to return to normal function.  Status at initial evaluation: pain 10/10 at worst in right groin and right LE to foot   Current Status: not met, 4-6/10 at worst.   ????  2) Goal: Pt will be able to demonstrate ability to self manage LBP and right LE pain to 1/10 at worst during all activities in order to return to normal function.  Status at initial evaluation: pain 10/10 at worst in right groin and right LE to foot  Current Status:  Not met, 3-6/10 pain at worst. ??    PLAN    Upgrade activities as tolerated       Continue plan of care    Update interventions per flow sheet         Discharge due to:_    Other:_      Arbutus Leas, PT 02/27/2016  5:23 PM  Lavon Paganini, SPT     Future Appointments  Date Time Provider [place]   02/28/2016 10:00 AM [facility]   02/29/2016 9:00 AM Regino Bellow, PTA [facility]   03/03/2016 3:00 PM [facility]   03/05/2016 4:00 PM Regino Bellow, PTA [facility]   03/07/2016 3:30 PM Regino Bellow, PTA [facility]   03/10/2016 4:00 PM Arbutus Leas, PT [facility]   03/11/2016 3:00 PM [facility]   03/12/2016 4:00 PM Regino Bellow, PTA [facility]   03/17/2016 4:00 PM Arbutus Leas, PT [facility]   03/19/2016 4:00 PM Regino Bellow, PTA [facility]

## 2016-02-28 ENCOUNTER — Inpatient Hospital Stay: Admit: 2016-02-28 | Payer: TRICARE (CHAMPUS) | Primary: Family Medicine

## 2016-02-28 DIAGNOSIS — N3946 Mixed incontinence: Secondary | ICD-10-CM

## 2016-02-28 NOTE — Progress Notes (Addendum)
[facility] at [place], [place] [address]  Phone: [phone]   Fax: [phone]      Pelvic Floor Progress Note  Patient name: Dayanara Sherrill Start of Care: 01/09/2016   Referral source: Crisoforo Oxford, MD DOB: 09/02/1956   Medical/Treatment Diagnosis: Urge incontinence [N39.41] Onset Date:3 years ago     Prior Hospitalization: see medical history Provider#: [id number]   Medications: Verified on Patient Summary List    Comorbidities: DM type 2, HTN, hypothyroid, arthritis  Prior Level of Function:Symptoms worsened over the last 3 years, with initially void frequency and urinary leakage over the last 4-5 months    Visits from Start of Care: 8    Missed Visits: 0    Established Goals:           Excellent Good         Limited           None   Increase Pelvic MM strength                Decrease Pelvic MM hypertonus              Decrease Incontinence Episodes              Improve Voiding Habits                  Decreased Urgency                 Decrease Pelvic Pain                  Key Functional Changes: Patient reports decrease leakage episodes  Short Term Goals: To be accomplished in 4 weeks:  1. Patient will demonstrate accurate performance of home exercise program/pelvic floor contractions as adjunct to physical therapy clinic visits to promote healthy lifestyle and improved quality of life.  Status @ Eval:Instructed in HEP to address deficits  Current: Progressing with urge suppression handout provided and increase in PFM contraction  2. Patient will Complete Bladder Diary for use in patient education and goal setting.  Status @ Eval:Pt issued bladder Dairy for completion due to Mixed Urinary Incontinence  Current: not returned, however PT reviewed bladder irritants and dietary modifications/recommendations  ????  Long Term Goals: To be accomplished in 8 weeks:  1. Patient will have decreased leakage episodes by 75% or better for increased quality of life.   Status @ Eval: 2-3 leakages episodes average/day  Current: Pt reports decrease leakage to 2x/week in AM with initial urgency but improved with PFM contraction  2. Patient will have decreased pad usage to </= 1/day increased patient comfort and quality of life.  Status @ Eval:3-4 pantyliners/day used  Current: 2 panty liners a day  3. Patient will have consistent normal void spacing of 2-4 hr for more normal function and increased quality of life.  Status @ Eval: Void interval of about 2 hours. Goal MET    ASSESSMENT/RECOMMENDATIONS:  Patient demonstrated good awareness of PFM contraction. Required cuing initially to decrease accessory use of adductors and abdominals with improvement noted throughout. PT plans to focus on discharge preparation as patient is making steady progress.     Continue therapy per initial plan/protocol at a frequency of  1 x per week for 2 weeks  Continue therapy with the following recommended changes:_____________________      _____________________________________________________________________  Discontinue therapy progressing towards or have reached established goals  Discontinue therapy due to lack of appreciable  progress towards goals  Discontinue therapy due to lack of attendance or compliance  Await Physician's recommendations/decisions regarding therapy  Other:________________________________________________________________    Thank you for this referral.   Tona Sensing 02/28/2016 6:15 PM  NOTE TO PHYSICIAN:  PLEASE COMPLETE THE ORDERS BELOW AND   FAX TO [facility] at [facility]    Fax: [phone]  If you are unable to process this request in 24 hours please contact our office:    [phone]      ? I have read the above report and request that my patient continue as recommended.  ? I have read the above report and request that my patient continue therapy with the following changes/special instructions:___________________________________________________________   ? I have read the above report and request that my patient be discharged from therapy.    Physician???s signature: _______________________________Date: _____Time:_____

## 2016-02-28 NOTE — Progress Notes (Addendum)
PF DAILY TREATMENT NOTE 3-16    Patient Name: Molly [name]  Date:02/28/2016  DOB: [birth year]    Patient DOB Verified  Payor: TRICARE / Plan: [facility] TRICARE RETIREES AND DEPENDENTS / Product Type: Tricare /    In time:10:10  Out time:10:50  Total Treatment Time (min): 40  Visit #: 8 of 16    Treatment Area:  Pelvic Floor      Other:    SUBJECTIVE  Pain Level (0-10 scale): 2/10  Any medication changes, allergies to medications, adverse drug reactions, diagnosis change, or new procedure performed?:  No     Yes (see summary sheet for update)  Subjective functional status/changes:    No changes reported  Patient reports improvement overall. Minimal leakage daily with no wetness noted on pad in AM and still use of daily pad. PT discussed plans to prepare for discharge as she is improving in all areas and is compliant with HEP.     OBJECTIVE    40 min Neuromuscular Re-education:    See flow sheet :     Pelvic floor strengthening                   Pelvic floor downtraining    Quality pelvic floor contractions         Relaxation techniques    Urge suppression exercises    Other:Biofeedback   Rationale: increase strength and improve coordination  to improve the patient???s ability to PFM contraction to decrease leakage episodes    Other Objective/Functional Measures:   baseline resting tone: 2-3  slow twitch mms net rise 22.8, max 60.8 10 sec hold, 10 sec rest  fast twitch mms net rise 33.2, max 60.2 , 3 sec hold, 10 sec rest    Pain Level (0-10 scale) post treatment: 2/10    ASSESSMENT/Changes in Function:   Patient demonstrated good awareness of PFM contraction. Required cuing initially to decrease accessory use of adductors and abdominals with improvement noted throughout.         Decrease # of leaks    No change   Improving  Resolved       Decrease hypertonus  No change   Improving  Resolved       Increase void interval  No change   Improving  Resolved       Increase PF strength  No change   Improving  Resolved        Increase PF endurance  No change   Improving  Resolved       Increase endurance  No change   Improving  Resolved       Decrease # of pads  No change   Improving  Resolved       Decrease pain  No change   Improving  Resolved       Increased coordination  No change   Improving  Resolved       Increased Bowel Frequency  No change   Improving  Resolved       Patient will continue to benefit from skilled PT services to modify and progress therapeutic interventions, address functional mobility deficits, address ROM deficits, address strength deficits and analyze and address soft tissue restrictions to attain remaining goals.       See Plan of Care    See progress note/recertification    See Discharge Summary         Progress towards goals / Updated goals:  Short Term Goals: To be accomplished in 4 weeks:  1. Patient will demonstrate accurate performance of home exercise program/pelvic floor contractions as adjunct to physical therapy clinic visits to promote healthy lifestyle and improved quality of life.  Status @ Eval:Instructed in HEP to address deficits  Current: Patient reports she is compliant    Long Term Goals: To be accomplished in 8 weeks:  1. Patient will have decreased leakage episodes by 75% or better for increased quality of life.  Status @ Eval: 2-3 leakages episodes average/day  Current: 1/xday  2. Patient will have decreased pad usage to </= 1/day increased patient comfort and quality of life.  Status @ Eval:3-4 pantyliners/day used  Current: not assessed    PLAN    Upgrade activities as tolerated       Continue plan of care    Update interventions per flow sheet         Discharge due to:_    Other:_      Nonnie Done 02/28/2016  10:10 AM    Future Appointments  Date Time Provider Department Center   02/29/2016 9:00 AM Beatris Si, PTA [facility]   03/03/2016 3:00 PM Nonnie Done [facility]   03/05/2016 4:00 PM Beatris Si, PTA [facility]   03/07/2016 3:30 PM Beatris Si, PTA [facility]    03/10/2016 4:00 PM Veda Canning, PT [place] [facility]   03/11/2016 3:00 PM Nonnie Done [facility]   03/12/2016 4:00 PM Beatris Si, PTA [facility]   03/17/2016 4:00 PM Veda Canning, PT [facility]   03/19/2016 4:00 PM Beatris Si, PTA [facility]

## 2016-02-29 ENCOUNTER — Inpatient Hospital Stay: Admit: 2016-02-29 | Payer: TRICARE (CHAMPUS) | Primary: Family Medicine

## 2016-02-29 NOTE — Progress Notes (Signed)
PT DAILY TREATMENT NOTE - MCR 12-16    Patient Name: Molly [name]  Date:02/29/2016  DOB: 10/19/1956    Patient DOB Verified  Payor: TRICARE / Plan: [facility] TRICARE RETIREES AND DEPENDENTS / Product Type: Tricare /    In time:903  Out time:943  Total Treatment Time (min): 40  Total Timed Codes (min): 40  1:1 Treatment Time ([place] only): na   Visit #: 10 of 16    Treatment Area: Low back pain [M54.5]  Unilateral primary osteoarthritis, unspecified hip [M16.10]    SUBJECTIVE  Pain Level (0-10 scale): 2  Any medication changes, allergies to medications, adverse drug reactions, diagnosis change, or new procedure performed?:  No     Yes (see summary sheet for update)  Subjective functional status/changes:    No changes reported  Reports some R groin tightness today.    OBJECTIVE      40 min Therapeutic Exercise:   See flow sheet :   Rationale: increase ROM, increase strength and improve coordination      With    TE    TA    neuro    other: Patient Education:  Review HEP     Progressed/Changed HEP based on:    positioning    body mechanics    transfers    heat/ice application     other:      Other Objective/Functional Measures:   none     Pain Level (0-10 scale) post treatment: 2    ASSESSMENT/Changes in Function:   Chief c/o of Right groin stiffness with TE. No increase of pain with TE.      Patient will continue to benefit from skilled PT services to modify and progress therapeutic interventions, address functional mobility deficits, address ROM deficits and address strength deficits to attain remaining goals.       See Plan of Care    See progress note/recertification    See Discharge Summary         Progress towards goals / Updated goals:  Short Term Goals: To be accomplished in 3 weeks:   1) Goal: Pt's right groin pain will decrease to 3/10 at worst in order to better tolerate all ADLs and lay prone for at least 5 minutes.  Status at initial evaluation: pain 10/10 at worst   Current Status: not met, 4/10 at worst ???? 02/27/16   ????  2) Goal: Innominate alignment will remain symmetric and stable in order to decrease pain and improve tolerance for ambulation and ADLs.  Status at initial evaluation: posteriorly rotated right innominate and left innominate upslip  Current Status: Innominates aligned 02/27/16   ????  3) Goal: Pt will be able to lay in prone position in order to sleep in various positions and walk with symmetric step length bilaterally  Status at initial evaluation: painful in prone position  Current Status: not met, pt still sleeps in recliner to accommodate for pain. 02/27/16   ????  4) Goal: Right LE radicular symptoms will be centralized to the low back in order to demonstrate effectiveness of directional preference exercises and decrease risk of LE dysfunction.  Status at initial evaluation: right groin pain, ache right anterior and medial thigh, sharp pain and crawling paresthesias in the right lower leg to foot, weakness right LE with occassional buckling of knee   Current Status: not met, improving, radiates to buttocks and mid leg region mostly on right side, decreased by 75%.   ????  5) Goal: Pt will be independent  and compliant with HEP to achieve other goals.  Status at initial evaluation: pt is not independent with exercises  Current Status: goal met, I and compliant with HEP. ????  ????  Long Term Goals: To be accomplished in 6 weeks:   1) Goal: Abolish LBP and radicular symptoms in order to return to normal function.  Status at initial evaluation: pain 10/10 at worst in right groin and right LE to foot   Current Status: not met, 4-6/10 at worst.   ????  2) Goal: Pt will be able to demonstrate ability to self manage LBP and right LE pain to 1/10 at worst during all activities in order to return to normal function.  Status at initial evaluation: pain 10/10 at worst in right groin and right LE to foot  Current Status:  Not met, 3-6/10 pain at worst. ??    PLAN     Upgrade activities as tolerated       Continue plan of care    Update interventions per flow sheet         Discharge due to:_    Other:_      Regino Bellow, PTA 02/29/2016  9:41 AM    Future Appointments  Date Time Provider [place]   03/03/2016 3:00 PM [place] [facility]   03/05/2016 4:00 PM Regino Bellow, PTA [facility]   03/07/2016 3:30 PM Regino Bellow, PTA [facility]   03/10/2016 4:00 PM Arbutus Leas, PT [facility]   03/11/2016 3:00 PM [place] [facility]   03/12/2016 4:00 PM Regino Bellow, PTA [facility]   03/17/2016 4:00 PM Arbutus Leas, PT [facility]   03/19/2016 4:00 PM Regino Bellow, PTA [facility]

## 2016-03-03 ENCOUNTER — Inpatient Hospital Stay: Admit: 2016-03-03 | Payer: TRICARE (CHAMPUS) | Primary: Family Medicine

## 2016-03-03 NOTE — Progress Notes (Signed)
PF DAILY TREATMENT NOTE 3-16    Patient Name: Molly [name]  Date:03/03/2016  DOB: 1956-03-04    Patient DOB Verified  Payor: TRICARE / Plan: [facility] TRICARE RETIREES AND DEPENDENTS / Product Type: Tricare /    In time: 03:00 Out time:03:45  Total Treatment Time (min): 45  Visit #: 9 of 10    Treatment Area:  Pelvic Floor      Other:    SUBJECTIVE  Pain Level (0-10 scale): 2/10  Any medication changes, allergies to medications, adverse drug reactions, diagnosis change, or new procedure performed?:  No     Yes (see summary sheet for update)  Subjective functional status/changes:    No changes reported  Patient reports 70-75% improvement and feels she is doing well with continuing recommendations to behavior modifications and HEP.     OBJECTIVE    10 min Therapeutic Activity:    See flow sheet :      Increase Tissue extensibility          Assess fiber intake      Assess voiding habits    Assess bowel habits    Other:   Rationale: increase ROM, increase strength, improve coordination and improve balance  to improve the patient???s ability to comply with behavior modifications    35 min Manual Therapy:  MFR/trigger point release bilateral puborectalis and obturator internus right>left. Trigger point release to psoas right side with TTP noted   Rationale: increase ROM, increase tissue extensibility and decrease trigger points to decrease leakage episodes          With    TE    TA    neuro   manual    other: Patient Education:  Review HEP     Progressed/Changed HEP based on:    positioning    body mechanics    transfers    heat/ice application     other:      Other Objective/Functional Measures:   PFM MMT: 4/5    Pain Level (0-10 scale) post treatment: 2/10    ASSESSMENT/Changes in Function:   Patient progressing in aspects of bladder function especially with leakage following first urgency in AM.      Decrease # of leaks    No change   Improving  Resolved       Decrease hypertonus  No change   Improving  Resolved        Increase void interval  No change   Improving  Resolved       Increase PF strength  No change   Improving  Resolved       Increase PF endurance  No change   Improving  Resolved       Increase endurance  No change   Improving  Resolved       Decrease # of pads  No change   Improving  Resolved       Decrease pain  No change   Improving  Resolved       Increased coordination  No change   Improving  Resolved       Increased Bowel Frequency  No change   Improving  Resolved       Patient will continue to benefit from skilled PT services to modify and progress therapeutic interventions, address functional mobility deficits, address ROM deficits, address strength deficits and analyze and address soft tissue restrictions focusing on preparation for discharge.        See Plan of Care  See progress note/recertification    See Discharge Summary         Progress towards goals / Updated goals:  Short Term Goals: To be accomplished in 4 weeks:  1. Patient will demonstrate accurate performance of home exercise program/pelvic floor contractions as adjunct to physical therapy clinic visits to promote healthy lifestyle and improved quality of life.  Status @ Eval:Instructed in HEP to address deficits  Current: Progressing with urge suppression handout provided and increase in PFM contraction  2. Patient will Complete Bladder Diary for use in patient education and goal setting.  Status @ Eval:Pt issued bladder Dairy for completion due to Mixed Urinary Incontinence  Current: not returned, however PT reviewed bladder irritants and dietary modifications/recommendations  ????  Long Term Goals: To be accomplished in 8 weeks:  1. Patient will have decreased leakage episodes by 75% or better for increased quality of life.  Status @ Eval: 2-3 leakages episodes average/day  Current: Pt reports decrease leakage to 2x/week in AM with initial urgency but improved with PFM contraction   2. Patient will have decreased pad usage to </= 1/day increased patient comfort and quality of life.  Status @ Eval:3-4 pantyliners/day used  Current: 2 panty liners a day  3. Patient will have consistent normal void spacing of 2-4 hr for more normal function and increased quality of life.  Status @ Eval: Void interval of about 2 hours. Goal MET    PLAN    Upgrade activities as tolerated       Continue plan of care    Update interventions per flow sheet         Discharge due to:_    Other:_      Tona Sensing 03/03/2016  3:00 PM    Future Appointments  Date Time Provider [place]   03/05/2016 4:00 PM Regino Bellow, P[facility]   03/07/2016 3:30 PM Regino Bellow, PTA [facility]   03/10/2016 4:00 PM Arbutus Leas, [facility]   03/11/2016 3:00 PM [place] [facility]   03/12/2016 4:00 PM Regino Bellow, PTA [facility]   03/17/2016 4:00 PM Arbutus Leas, [facility]   03/19/2016 4:00 PM Regino Bellow, PTA [facility]

## 2016-03-03 NOTE — Progress Notes (Signed)
[facility] [place], [place] [address]  Phone: [phone]   Fax: [phone]    Discharge Summary    Patient name: Molly [name]     Start of Care: 01/09/2016  Referral source: Crisoforo Oxford, MD    DOB: 1956-07-08  Medical/Treatment Diagnosis: Mixed incontinence [N39.46]  Onset Date:3 years ago  Prior Hospitalization: see medical history   Provider#: [id number]  Comorbidities: DM type 2, HTN, hypothyroid, arthritis  Prior Level of Function:Symptoms worsened over the last 3 years, with initially void frequency and urinary leakage over the last 4-5 months  Medications: Verified on Patient Summary List    Visits from Start of Care: 9    Missed Visits: 0  Reporting Period : 02/28/2016 to 04/17/2016    Short Term Goals: To be accomplished in 4 weeks:  1. Patient will demonstrate accurate performance of home exercise program/pelvic floor contractions as adjunct to physical therapy clinic visits to promote healthy lifestyle and improved quality of life.  Status @ Eval:Instructed in HEP to address deficits  Current: Progressing with urge suppression handout provided and increase in PFM contraction, GOAL MET  2. Patient will Complete Bladder Diary for use in patient education and goal setting.  Status @ Eval:Pt issued bladder Dairy for completion due to Mixed Urinary Incontinence  Current: not returned, however PT reviewed bladder irritants and dietary modifications/recommendations, GOAL MET  ????  Long Term Goals: To be accomplished in 8 weeks:  1. Patient will have decreased leakage episodes by 75% or better for increased quality of life.  Status @ Eval: 2-3 leakages episodes average/day  Current: Pt reports decrease leakage to 2x/week in AM with initial urgency but improved with PFM contraction, GOAL MET  2. Patient will have decreased pad usage to </= 1/day increased patient comfort and quality of life.  Status @ Eval:3-4 pantyliners/day used   Current: 2 panty liners a day, NOT MET  3. Patient will have consistent normal void spacing of 2-4 hr for more normal function and increased quality of life.  Status @ Eval: Void interval of about 2 hours. Goal MET    Assessment/ Summary of Care:   Patient requested for discharge at this time. She has met goals set and has demonstrated compliance with HEP and self-management. Therefore, no further skilled PT services indicated at this time.     RECOMMENDATIONS:  Discontinue therapy: Patient has reached or is progressing toward set goals      Patient is non-compliant or has abdicated      Due to lack of appreciable progress towards set goals    Tona Sensing 04/17/2016 1:23 PM

## 2016-03-05 ENCOUNTER — Encounter: Payer: TRICARE (CHAMPUS) | Primary: Family Medicine

## 2016-03-06 ENCOUNTER — Inpatient Hospital Stay: Admit: 2016-03-06 | Payer: TRICARE (CHAMPUS) | Primary: Family Medicine

## 2016-03-06 NOTE — Progress Notes (Addendum)
PT DAILY TREATMENT NOTE - MCR 12-16    Patient Name: Molly [name]  Date:03/06/2016  DOB: [birth year]    Patient DOB Verified  Payor: TRICARE / Plan: [facility] TRICARE RETIREES AND DEPENDENTS / Product Type: Tricare /    In time:505  Out time:550  Total Treatment Time (min): 45  Total Timed Codes (min): 45  1:1 Treatment Time ([place] only): na   Visit #: 11 of 16    Treatment Area: Low back pain [M54.5]  Unilateral primary osteoarthritis, unspecified hip [M16.10]    SUBJECTIVE  Pain Level (0-10 scale): 2  Any medication changes, allergies to medications, adverse drug reactions, diagnosis change, or new procedure performed?:  No     Yes (see summary sheet for update)  Subjective functional status/changes:      No changes reported      OBJECTIVE    45 min Therapeutic Exercise:   See flow sheet :   Rationale: increase ROM, increase strength and improve coordination     With    TE    TA    neuro    other: Patient Education:  Review HEP     Progressed/Changed HEP based on:    positioning    body mechanics    transfers    heat/ice application     other:      Other Objective/Functional Measures:   none     Pain Level (0-10 scale) post treatment: 1    ASSESSMENT/Changes in Function:   Chief c/o R anterior hip tightness with TE.  Reports of intermittent "catching pain"    Patient will continue to benefit from skilled PT services to modify and progress therapeutic interventions, address functional mobility deficits, address ROM deficits and address strength deficits to attain remaining goals.       See Plan of Care    See progress note/recertification    See Discharge Summary         Progress towards goals / Updated goals:  Short Term Goals: To be accomplished in 3 weeks:   1) Goal: Pt's right groin pain will decrease to 3/10 at worst in order to better tolerate all ADLs and lay prone for at least 5 minutes.  Status at initial evaluation: pain 10/10 at worst  Current Status: not met, 4/10 at worst ???? 02/27/16   ????   2) Goal: Innominate alignment will remain symmetric and stable in order to decrease pain and improve tolerance for ambulation and ADLs.  Status at initial evaluation: posteriorly rotated right innominate and left innominate upslip  Current Status: Innominates aligned 02/27/16   ????  3) Goal: Pt will be able to lay in prone position in order to sleep in various positions and walk with symmetric step length bilaterally  Status at initial evaluation: painful in prone position  Current Status: not met, pt still sleeps in recliner to accommodate for pain. 02/27/16   ????  4) Goal: Right LE radicular symptoms will be centralized to the low back in order to demonstrate effectiveness of directional preference exercises and decrease risk of LE dysfunction.  Status at initial evaluation: right groin pain, ache right anterior and medial thigh, sharp pain and crawling paresthesias in the right lower leg to foot, weakness right LE with occassional buckling of knee   Current Status: not met, improving, radiates to buttocks and mid leg region mostly on right side, decreased by 75%.   ????  5) Goal: Pt will be independent and compliant with HEP to achieve other goals.  Status at initial evaluation: pt is not independent with exercises  Current Status: goal met, I and compliant with HEP. ????  ????  Long Term Goals: To be accomplished in 6 weeks:   1) Goal: Abolish LBP and radicular symptoms in order to return to normal function.  Status at initial evaluation: pain 10/10 at worst in right groin and right LE to foot   Current Status: not met, 4-6/10 at worst.   ????  2) Goal: Pt will be able to demonstrate ability to self manage LBP and right LE pain to 1/10 at worst during all activities in order to return to normal function.  Status at initial evaluation: pain 10/10 at worst in right groin and right LE to foot  Current Status:  Not met, 3-6/10 pain at worst. ??    PLAN    Upgrade activities as tolerated       Continue plan of care     Update interventions per flow sheet         Discharge due to:_    Other:_      Regino Bellow, PTA 03/06/2016  5:18 PM    Future Appointments  Date Time Provider [place]   03/07/2016 3:30 PM Regino Bellow, PTA [place] [facility]   03/10/2016 4:00 PM Arbutus Leas, [place] [facility]   03/12/2016 4:00 PM Regino Bellow, PTA [facility]   03/13/2016 3:45 PM [place] [facility]   03/17/2016 4:00 PM Arbutus Leas, [place] [facility]   03/19/2016 4:00 PM Regino Bellow, PTA [facility]

## 2016-03-07 ENCOUNTER — Inpatient Hospital Stay: Admit: 2016-03-07 | Payer: TRICARE (CHAMPUS) | Primary: Family Medicine

## 2016-03-07 NOTE — Progress Notes (Addendum)
PT DAILY TREATMENT NOTE - MCR 12-16    Patient Name: Molly [name]  Date:03/07/2016  DOB: Mar 29, 1956    Patient DOB Verified  Payor: TRICARE / Plan: [facility] TRICARE RETIREES AND DEPENDENTS / Product Type: Tricare /    In time:330  Out time:412  Total Treatment Time (min): 42  Total Timed Codes (min): 42  1:1 Treatment Time ([place] only): na   Visit #: 12 of  16    Treatment Area: Low back pain [M54.5]  Unilateral primary osteoarthritis, unspecified hip [M16.10]    SUBJECTIVE  Pain Level (0-10 scale): 2  Any medication changes, allergies to medications, adverse drug reactions, diagnosis change, or new procedure performed?:  No     Yes (see summary sheet for update)  Subjective functional status/changes:    No changes reported  Anterior hip and quad pain persists    OBJECTIVE        42 min Therapeutic Exercise:   See flow sheet :   Rationale: increase ROM and increase strength          With    TE    TA    neuro    other: Patient Education:  Review HEP     Progressed/Changed HEP based on:    positioning    body mechanics    transfers    heat/ice application     other:      Other Objective/Functional Measures:   none     Pain Level (0-10 scale) post treatment: 1    ASSESSMENT/Changes in Function:   Overall decrease in pain with ADL's    Patient will continue to benefit from skilled PT services to modify and progress therapeutic interventions, address functional mobility deficits and address ROM deficits to attain remaining goals.       See Plan of Care    See progress note/recertification    See Discharge Summary         Progress towards goals / Updated goals:  Short Term Goals: To be accomplished in 3 weeks:   1) Goal: Pt's right groin pain will decrease to 3/10 at worst in order to better tolerate all ADLs and lay prone for at least 5 minutes.  Status at initial evaluation: pain 10/10 at worst  Current Status:, 2-3/10 over past week   ????  2) Goal: Innominate alignment will remain symmetric and stable in order to  decrease pain and improve tolerance for ambulation and ADLs.  Status at initial evaluation: posteriorly rotated right innominate and left innominate upslip  Current Status: Innominates aligned 02/27/16   ????  3) Goal: Pt will be able to lay in prone position in order to sleep in various positions and walk with symmetric step length bilaterally  Status at initial evaluation: painful in prone position  Current Status: not met, pt still sleeps in recliner to accommodate for pain. 02/27/16   ????  4) Goal: Right LE radicular symptoms will be centralized to the low back in order to demonstrate effectiveness of directional preference exercises and decrease risk of LE dysfunction.  Status at initial evaluation: right groin pain, ache right anterior and medial thigh, sharp pain and crawling paresthesias in the right lower leg to foot, weakness right LE with occassional buckling of knee   Current Status: not met, improving, radiates to buttocks and mid leg region mostly on right side, decreased by 75%.   ????  5) Goal: Pt will be independent and compliant with HEP to achieve other goals.  Status at  initial evaluation: pt is not independent with exercises  Current Status: goal met, I and compliant with HEP. ????  ????  Long Term Goals: To be accomplished in 6 weeks:   1) Goal: Abolish LBP and radicular symptoms in order to return to normal function.  Status at initial evaluation: pain 10/10 at worst in right groin and right LE to foot   Current Status: not met, 4-6/10 at worst.   ????  2) Goal: Pt will be able to demonstrate ability to self manage LBP and right LE pain to 1/10 at worst during all activities in order to return to normal function.  Status at initial evaluation: pain 10/10 at worst in right groin and right LE to foot  Current Status:  Not met, 3-6/10 pain at worst. ??    PLAN    Upgrade activities as tolerated       Continue plan of care    Update interventions per flow sheet         Discharge due to:_    Other:_       Regino Bellow, PTA 03/07/2016  3:55 PM    Future Appointments  Date Time Provider [place]   03/10/2016 4:00 PM Arbutus Leas, [place] [facility]   03/12/2016 4:00 PM Regino Bellow, PTA [facility]   03/13/2016 3:45 PM [place] [facility]   03/17/2016 4:00 PM Arbutus Leas, [facility]   03/19/2016 4:00 PM Regino Bellow, PTA [facility]

## 2016-03-10 ENCOUNTER — Inpatient Hospital Stay
Admit: 2016-03-10 | Payer: TRICARE (CHAMPUS) | Attending: Rehabilitative and Restorative Service Providers" | Primary: Family Medicine

## 2016-03-10 NOTE — Progress Notes (Signed)
PT DAILY TREATMENT NOTE - MCR 3-16    Patient Name: Molly [name]  Date:03/10/2016  DOB: [birth year]    Patient DOB Verified  Payor: TRICARE / Plan: [facility] TRICARE RETIREES AND DEPENDENTS / Product Type: Tricare /    In time:4:05  Out time: 5:15  Total Treatment Time (min): 65  Visit #: 13 of 16    Treatment Area: Low back pain [M54.5]  Unilateral primary osteoarthritis, unspecified hip [M16.10]    SUBJECTIVE  Pain Level (0-10 scale): 2  Any medication changes, allergies to medications, adverse drug reactions, diagnosis change, or new procedure performed?:  No     Yes (see summary sheet for update)  Subjective functional status/changes:    No changes reported  I have felt better with PF sersvices. I am ready for FP d/c.     OBJECTIVE    Modality rationale: decrease inflammation, decrease pain and increase tissue extensibility to improve the patient???s ability to improve mobility    Min Type Additional Details     Estim:  Unatt       IFC  Premod                        Other:  w/ice   w/heat  Position:  Location:     Estim: Att    TENS instruct  NMES                    Other:  w/US   w/ice   w/heat  Position:  Location:   10 +5 set up and educ.   Traction:  Cervical       Lumbar                        Prone          Supine                       Intermittent   Continuous Lbs: 130# max /80 min   before manual   after manual      Ultrasound: Continuous    Pulsed                           1MHz   3MHz Location:  W/cm2:      Iontophoresis with dexamethasone         Location:  Take home patch    In clinic   10   Ice       heat    Ice massage    Laser     Anodyne Location: back  Position: supine with legs elevated.       Laser with stim    Other: Position:  Location:      Vasopneumatic Device Pressure:        lo  med  hi   Temperature:  lo  med  hi    Skin assessment post-treatment:  intact redness- no adverse reaction    redness ??? adverse reaction:       30 min Therapeutic Exercise:   See flow sheet :    Rationale: increase ROM, increase strength, improve coordination, improve balance and increase proprioception to improve the patient???s ability to improve mobility        10 min Neuromuscular Re-education:    See flow sheet :   Rationale: increase ROM, increase strength, improve coordination, improve balance and increase proprioception  to improve the patient???s ability to improve core stability         With    TE    TA    neuro    other: Patient Education:  Review HEP     Progressed/Changed HEP based on:    positioning    body mechanics    transfers    heat/ice application     other: post s/sx after tx discussed.      Other Objective/Functional Measures: cues needed to safely roll on the plinth.      Pain Level (0-10 scale) post treatment: 1, pt did feel soreness in back after session, however localized to back . MHP applied after traction for 10 min to alleviate soreness.     ASSESSMENT/Changes in Function: pt initiated with mechanical traction with cues for posture and with safety with rolling. Pt said she forgot about rolling or getting up from the plinth  Safely and needed cues to be reminded.     Patient will continue to benefit from skilled PT services to modify and progress therapeutic interventions, address functional mobility deficits, address ROM deficits, address strength deficits, analyze and address soft tissue restrictions, analyze and cue movement patterns, analyze and modify body mechanics/ergonomics, assess and modify postural abnormalities and address imbalance/dizziness to attain remaining goals.       See Plan of Care    See progress note/recertification    See Discharge Summary         Progress towards goals / Updated goals:  Short Term Goals: To be accomplished in 3 weeks:   1) Goal: Pt's right groin pain will decrease to 3/10 at worst in order to better tolerate all ADLs and lay prone for at least 5 minutes.  Status at initial evaluation: pain 10/10 at worst   Re-assess Status:, 2-3/10 over past week   Current: 5/10 at worst this am with pain in knee    2) Goal: Innominate alignment will remain symmetric and stable in order to decrease pain and improve tolerance for ambulation and ADLs.  Status at initial evaluation: posteriorly rotated right innominate and left innominate upslip  Re-assess Status: Innominates aligned 02/27/16   Current: n/a  ????  3) Goal: Pt will be able to lay in prone position in order to sleep in various positions and walk with symmetric step length bilaterally  Status at initial evaluation: painful in prone position  Re-assess Status: not met, pt still sleeps in recliner to accommodate for pain. 02/27/16   Current: n/a  ??  4) Goal: Right LE radicular symptoms will be centralized to the low back in order to demonstrate effectiveness of directional preference exercises and decrease risk of LE dysfunction.  Status at initial evaluation: right groin pain, ache right anterior and medial thigh, sharp pain and crawling paresthesias in the right lower leg to foot, weakness right LE with occassional buckling of knee   Re-assess Status: not met, improving, radiates to buttocks and mid leg region mostly on right side, decreased by 75%.   Current: 1-2 sessions     ????  5) Goal: Pt will be independent and compliant with HEP to achieve other goals.  Status at initial evaluation: pt is not independent with exercises  Re-assess Status: goal met, I and compliant with HEP. ????  Current: goal met      Long Term Goals: To be accomplished in 6 weeks:   1) Goal: Abolish LBP and radicular symptoms in order to return to normal function.  Status  at initial evaluation: pain 10/10 at worst in right groin and right LE to foot   Re-assess Status: not met, 4-6/10 at worst.   Current: 5/10 at worst this am with pain in knee  ??  2) Goal: Pt will be able to demonstrate ability to self manage LBP and right LE pain to 1/10 at worst during all activities in order to return to  normal function.  Status at initial evaluation: pain 10/10 at worst in right groin and right LE to foot  Re-assess Status:  Not met, 3-6/10 pain at worst. ??  Current:  5/10 the worst.   ??    PLAN    Upgrade activities as tolerated       Continue plan of care    Update interventions per flow sheet         Discharge due to:_    Other:_  Continue with mechanical traction for lumbar       Arbutus Leas, PT 03/10/2016  4:26 PM

## 2016-03-11 ENCOUNTER — Encounter: Payer: TRICARE (CHAMPUS) | Primary: Family Medicine

## 2016-03-12 ENCOUNTER — Encounter: Payer: TRICARE (CHAMPUS) | Primary: Family Medicine

## 2016-03-13 ENCOUNTER — Inpatient Hospital Stay: Admit: 2016-03-13 | Payer: TRICARE (CHAMPUS) | Primary: Family Medicine

## 2016-03-13 ENCOUNTER — Encounter: Payer: TRICARE (CHAMPUS) | Primary: Family Medicine

## 2016-03-13 NOTE — Progress Notes (Signed)
PT DAILY TREATMENT NOTE - MCR 12-16    Patient Name: Molly [name]  Date:03/13/2016  DOB: 06/08/1956    Patient DOB Verified  Payor: TRICARE / Plan: [facility] TRICARE RETIREES AND DEPENDENTS / Product Type: Tricare /    In time:448  Out time:530  Total Treatment Time (min): 42  Total Timed Codes (min): 42  1:1 Treatment Time ([place] only): na   Visit #: 14 of 16    Treatment Area: Low back pain [M54.5]  Unilateral primary osteoarthritis, unspecified hip [M16.10]    SUBJECTIVE  Pain Level (0-10 scale): 2  Any medication changes, allergies to medications, adverse drug reactions, diagnosis change, or new procedure performed?:  No     Yes (see summary sheet for update)  Subjective functional status/changes:    No changes reported  Reports significant LS post TX last session "I was unable to get up on my own" was placed on heat afterwards and symptoms decreased.   Mech Tx held per pt request.    OBJECTIVE    Modality rationale: decrease pain and increase tissue extensibility   Min Type Additional Details     Estim:  Unatt       IFC  Premod                        Other:  w/ice   w/heat  Position:  Location:     Estim: Att    TENS instruct  NMES                    Other:  w/US   w/ice   w/heat  Position:  Location:      Traction:  Cervical       Lumbar                        Prone          Supine                       Intermittent   Continuous Lbs:   before manual   after manual      Ultrasound: Continuous    Pulsed                           1MHz   3MHz W/cm2:  Location:      Iontophoresis with dexamethasone         Location:  Take home patch    In clinic      Ice       heat    Ice massage    Laser     Anodyne Position:  Location:      Laser with stim    Other:  Position:  Location:      Vasopneumatic Device Pressure:        lo  med  hi   Temperature:  lo  med  hi    Skin assessment post-treatment:  intact redness- no adverse reaction    redness ??? adverse reaction:       32 min Therapeutic Exercise:   See flow sheet :    Rationale: increase ROM, increase strength and improve coordination          With    TE    TA    neuro    other: Patient Education:  Review HEP     Progressed/Changed HEP based on:  positioning    body mechanics    transfers    heat/ice application     other:      Other Objective/Functional Measures:   none     Pain Level (0-10 scale) post treatment: 1    ASSESSMENT/Changes in Function:   Fair tolerance to TE today with increased antalgic movements coming form Right hip during ambulation.      Patient will continue to benefit from skilled PT services to modify and progress therapeutic interventions, address functional mobility deficits, address ROM deficits and address strength deficits to attain remaining goals.       See Plan of Care    See progress note/recertification    See Discharge Summary         Progress towards goals / Updated goals:  Short Term Goals: To be accomplished in 3 weeks:   1) Goal: Pt's right groin pain will decrease to 3/10 at worst in order to better tolerate all ADLs and lay prone for at least 5 minutes.  Status at initial evaluation: pain 10/10 at worst  Re-assess Status:, 2-3/10 over past week   Current: 5/10 at worst this am with pain in knee  ??  2) Goal: Innominate alignment will remain symmetric and stable in order to decrease pain and improve tolerance for ambulation and ADLs.  Status at initial evaluation: posteriorly rotated right innominate and left innominate upslip  Re-assess Status: Innominates aligned 02/27/16   Current: n/a  ????  3) Goal: Pt will be able to lay in prone position in order to sleep in various positions and walk with symmetric step length bilaterally  Status at initial evaluation: painful in prone position  Re-assess Status: not met, pt still sleeps in recliner to accommodate for pain. 02/27/16   Current: n/a  ??  4) Goal: Right LE radicular symptoms will be centralized to the low back in order to demonstrate effectiveness of directional preference exercises  and decrease risk of LE dysfunction.  Status at initial evaluation: right groin pain, ache right anterior and medial thigh, sharp pain and crawling paresthesias in the right lower leg to foot, weakness right LE with occassional buckling of knee   Re-assess Status: not met, improving, radiates to buttocks and mid leg region mostly on right side, decreased by 75%.   Current: 1-2 sessions   ??  ????  5) Goal: Pt will be independent and compliant with HEP to achieve other goals.  Status at initial evaluation: pt is not independent with exercises  Re-assess Status: goal met, I and compliant with HEP. ????  Current: goal met   ??  Long Term Goals: To be accomplished in 6 weeks:   1) Goal: Abolish LBP and radicular symptoms in order to return to normal function.  Status at initial evaluation: pain 10/10 at worst in right groin and right LE to foot   Re-assess Status: not met, 4-6/10 at worst.   Current: 5/10 at worst this am with pain in knee  ??  2) Goal: Pt will be able to demonstrate ability to self manage LBP and right LE pain to 1/10 at worst during all activities in order to return to normal function.  Status at initial evaluation: pain 10/10 at worst in right groin and right LE to foot  Re-assess Status:  Not met, 3-6/10 pain at worst. ??  Current: 5/10 the worst.   ??    PLAN    Upgrade activities as tolerated       Continue plan  of care    Update interventions per flow sheet         Discharge due to:_    Other:_      Regino Bellow, PTA 03/13/2016  5:00 PM    Future Appointments  Date Time Provider [employer]   03/17/2016 4:00 PM Arbutus Leas, [place] [facility]   03/19/2016 4:00 PM Regino Bellow, PTA [facility]

## 2016-03-17 ENCOUNTER — Encounter: Payer: TRICARE (CHAMPUS) | Attending: Rehabilitative and Restorative Service Providers" | Primary: Family Medicine

## 2016-03-19 ENCOUNTER — Inpatient Hospital Stay: Payer: TRICARE (CHAMPUS) | Primary: Family Medicine

## 2016-03-20 ENCOUNTER — Inpatient Hospital Stay: Admit: 2016-03-20 | Payer: TRICARE (CHAMPUS) | Primary: Family Medicine

## 2016-03-20 NOTE — Progress Notes (Signed)
Pt was last seen 03/20/2016. PT was unable to further assess progress towards goals and treatment plans at this time due to non-compliance/lack of attendance. DC at this time with no further instructions to the patient.  Thank you for this referral.

## 2016-03-20 NOTE — Progress Notes (Signed)
PT DAILY TREATMENT NOTE - MCR 12-16    Patient Name: Molly [name]  Date:03/20/2016  DOB: Dec 30, 1955    Patient DOB Verified  Payor: TRICARE / Plan: [facility] [employer]ARE RETIREES AND DEPENDENTS / Product Type: Tricare /    In time:405  Out time:500  Total Treatment Time (min): 55  Total Timed Codes (min): 45  1:1 Treatment Time ([place] only): na   Visit #: 15 of 16    Treatment Area: Low back pain [M54.5]  Unilateral primary osteoarthritis, unspecified hip [M16.10]    SUBJECTIVE  Pain Level (0-10 scale): 3  Any medication changes, allergies to medications, adverse drug reactions, diagnosis change, or new procedure performed?:  No     Yes (see summary sheet for update)  Subjective functional status/changes:    No changes reported  Chief c/o of quad and hip impingement pain.    OBJECTIVE    Modality rationale: decrease pain and increase tissue extensibility    Min Type Additional Details     Estim:  Unatt       IFC  Premod                        Other:  w/ice   w/heat  Position:  Location:     Estim: Att    TENS instruct  NMES                    Other:  w/US   w/ice   w/heat  Position:  Location:      Traction:  Cervical       Lumbar                        Prone          Supine                       Intermittent   Continuous Lbs:   before manual   after manual      Ultrasound: Continuous    Pulsed                           1MHz   3MHz W/cm2:  Location:      Iontophoresis with dexamethasone         Location:  Take home patch    In clinic   10   Ice       heat    Ice massage    Laser     Anodyne Position:supine  Location: anterior R h      Laser with stim    Other:  Position:  Location:      Vasopneumatic Device Pressure:        lo  med  hi   Temperature:  lo  med  hi    Skin assessment post-treatment:  intact redness- no adverse reaction    redness ??? adverse reaction:       45 min Therapeutic Exercise:   See flow sheet :   Rationale: increase ROM, increase strength and improve coordination           With    TE    TA    neuro     other: Patient Education:  Review HEP     Progressed/Changed HEP based on:    positioning    body mechanics    transfers    heat/ice application  other:      Other Objective/Functional Measures:   none     Pain Level (0-10 scale) post treatment: 1    ASSESSMENT/Changes in Function:   Most relief with leg ext activities and quad stretches    Patient will continue to benefit from skilled PT services to modify and progress therapeutic interventions, address functional mobility deficits, address ROM deficits and address strength deficits to attain remaining goals.       See Plan of Care    See progress note/recertification    See Discharge Summary         Progress towards goals / Updated goals:  Short Term Goals: To be accomplished in 3 weeks:   1) Goal: Pt's right groin pain will decrease to 3/10 at worst in order to better tolerate all ADLs and lay prone for at least 5 minutes.  Status at initial evaluation: pain 10/10 at worst  Re-assess Status:, 2-3/10 over past week   Current: 5/10 at worst this am with pain in knee  ????  2) Goal: Innominate alignment will remain symmetric and stable in order to decrease pain and improve tolerance for ambulation and ADLs.  Status at initial evaluation: posteriorly rotated right innominate and left innominate upslip  Re-assess Status: Innominates aligned 02/27/16   Current: n/a  ????  3) Goal: Pt will be able to lay in prone position in order to sleep in various positions and walk with symmetric step length bilaterally  Status at initial evaluation: painful in prone position  Re-assess Status: not met, pt still sleeps in recliner to accommodate for pain. 02/27/16   Current: n/a  ????  4) Goal: Right LE radicular symptoms will be centralized to the low back in order to demonstrate effectiveness of directional preference exercises and decrease risk of LE dysfunction.  Status at initial evaluation: right groin pain, ache right anterior and  medial thigh, sharp pain and crawling paresthesias in the right lower leg to foot, weakness right LE with occassional buckling of knee   Re-assess Status: not met, improving, radiates to buttocks and mid leg region mostly on right side, decreased by 75%.   Current: 1-2 sessions   ????  ????  5) Goal: Pt will be independent and compliant with HEP to achieve other goals.  Status at initial evaluation: pt is not independent with exercises  Re-assess Status: goal met, I and compliant with HEP. ????  Current: goal met   ????  Long Term Goals: To be accomplished in 6 weeks:   1) Goal: Abolish LBP and radicular symptoms in order to return to normal function.  Status at initial evaluation: pain 10/10 at worst in right groin and right LE to foot   Re-assess Status: not met, 4-6/10 at worst.   Current: 5/10 at worst this am with pain in knee  ????  2) Goal: Pt will be able to demonstrate ability to self manage LBP and right LE pain to 1/10 at worst during all activities in order to return to normal function.  Status at initial evaluation: pain 10/10 at worst in right groin and right LE to foot  Re-assess Status:  Not met, 3-6/10 pain at worst. ??  Current: 5/10 the worst.   ??    PLAN    Upgrade activities as tolerated       Continue plan of care    Update interventions per flow sheet         Discharge due to:_    Other:_  Regino Bellow, PTA 03/20/2016  5:14 PM    No future appointments.

## 2016-04-09 ENCOUNTER — Encounter: Primary: Family Medicine

## 2016-04-09 ENCOUNTER — Encounter: Attending: Rehabilitative and Restorative Service Providers" | Primary: Family Medicine

## 2016-06-05 ENCOUNTER — Encounter: Payer: TRICARE (CHAMPUS) | Primary: Family Medicine

## 2016-06-11 ENCOUNTER — Inpatient Hospital Stay
Admit: 2016-06-11 | Payer: TRICARE (CHAMPUS) | Attending: Rehabilitative and Restorative Service Providers" | Primary: Family Medicine

## 2016-06-11 DIAGNOSIS — M7542 Impingement syndrome of left shoulder: Secondary | ICD-10-CM

## 2016-06-11 NOTE — Progress Notes (Signed)
PT DAILY TREATMENT NOTE 12-16    Patient Name: Molly [name]  Date:06/11/2016  DOB: [birth year]    Patient DOB Verified  Payor: TRICARE / Plan: [facility] [employer] RETIREES AND DEPENDENTS / Product Type: Tricare /    In time:2:10  Out time: 3:02  Total Treatment Time (min): 48  Visit #: 1 of 8    Treatment Area: Impingement syndrome of left shoulder [M75.42]    SUBJECTIVE  Pain Level (0-10 scale): 3  Any medication changes, allergies to medications, adverse drug reactions, diagnosis change, or new procedure performed?:  No     Yes (see summary sheet for update)  Subjective functional status/changes:    No changes reported  I have most of my pain in the right hip still and in the back. The left shoulder is still achy and sore and prevents me from doing certain tasks.     OBJECTIVE    Modality rationale: decrease inflammation, decrease pain and increase tissue extensibility to improve the patient???s ability to improve mobility    Min Type Additional Details     Estim:  Unatt       IFC  Premod                        Other:  w/ice   w/heat  Position:  Location:     Estim: Att    TENS instruct  NMES                    Other:  w/US   w/ice   w/heat  Position:  Location:      Traction:  Cervical       Lumbar                        Prone          Supine                       Intermittent   Continuous Lbs:   before manual   after manual      Ultrasound: Continuous    Pulsed                           1MHz   3MHz W/cm2:  Location:      Iontophoresis with dexamethasone         Location:  Take home patch    In clinic   10   Ice       heat    Ice massage    Laser     Anodyne Position: supine  Location: left shoulder      Laser with stim    Other:  Position:  Location:      Vasopneumatic Device Pressure:        lo  med  hi   Temperature:  lo  med  hi    Skin assessment post-treatment:  intact redness- no adverse reaction    redness ??? adverse reaction:     22 min Eval                  Re-Eval        8 min Therapeutic Exercise:   See flow sheet :   Rationale: increase ROM, increase strength and improve coordination to improve the patient???s ability to improve mobility     8 min Manual Therapy:  STM, MFR to Left  UT and rhomboids. GH II mobs and oscillations.    Rationale: decrease pain, increase ROM, increase tissue extensibility and decrease trigger points to improve mobility           With    TE    TA    neuro    other: Patient Education:  Review HEP     Progressed/Changed HEP based on:    positioning    body mechanics    transfers    heat/ice application     other: HEP issued and posture education.       Other Objective/Functional Measures:  See written eval in chart.      Pain Level (0-10 scale) post treatment: 0    ASSESSMENT/Changes in Function: See POC     Patient will continue to benefit from skilled PT services to modify and progress therapeutic interventions, address functional mobility deficits, address ROM deficits, address strength deficits, analyze and address soft tissue restrictions, analyze and cue movement patterns, analyze and modify body mechanics/ergonomics and assess and modify postural abnormalities to attain remaining goals.       See Plan of Care    See progress note/recertification    See Discharge Summary         Progress towards goals / Updated goals:  See POC    PLAN    Upgrade activities as tolerated       Continue plan of care    Update interventions per flow sheet         Discharge due to:_    Other:_      Veda Canning, PT 06/11/2016  3:03 PM    Future Appointments  Date Time Provider Department Center   06/13/2016 12:30 PM Veda Canning, PT [facility]   06/16/2016 1:30 PM Veda Canning, PT [facility]   06/18/2016 2:30 PM Veda Canning, PT [facility]   06/26/2016 12:30 PM Veda Canning, PT [facility]

## 2016-06-11 NOTE — Progress Notes (Signed)
[facility]  [address] [place], [place] [id number]  Ph [phone]  Fx [phone]) [phone]    Plan of Care/ Statement of Necessity for Physical Therapy Services    Patient name: Molly LotShirley [name] Start of Care: 06/11/2016   Referral source: Molly Milesarks, Molly [name], [place] DOB: [birth year]    Medical Diagnosis: Impingement syndrome of left shoulder [M75.42]   Onset Date:April 2016    Treatment Diagnosis: left shoulder pain, weakness, decreased ROM, difficulty with ADLs.    Prior Hospitalization: see medical history Provider#: [id number]   Medications: Verified on Patient summary List    Comorbidities: DM, Thyroid, HTN, Chronic hip and back pain    Prior Level of Function: Pt amb intermittently with SPC, I with ADL's and IADL's.       The Plan of Care and following information is based on the information from the initial evaluation.  Assessment/ key information: Pt is a pleasant and cooperative 60 yo female with c/c of left shoulder pain that limits her with ADL's, dressing and bathing tasks. Pt describes pain as sharp and shooting in the shoulder and shoulder blade areas. Pt has limited AROM and strength in left shoulder compared to right arm. Pain at worst is 10/10 w/ certain movements. Pt has ongoing back and hip pain that also affects her and she reports using the cane in the left hand has caused the pain in her shoulder. Pt has moderate TTP in surrounding soft tissue.  Pt hs s/sx consistent with bursitis and tendonitis in the left shoulder to be addressed with skilled PT services. Please advise.     Evaluation Complexity History HIGH Complexity :3+ comorbidities / personal factors will impact the outcome/ POC ; Examination MEDIUM Complexity : 3 Standardized tests and measures addressing body structure, function, activity limitation and / or participation in recreation  ;Presentation MEDIUM Complexity : Evolving with changing characteristics  ;Clinical Decision Making MEDIUM Complexity : FOTO score of 26-74   Overall Complexity Rating: MEDIUM  Problem List: pain affecting function, decrease ROM, decrease strength, edema affecting function, impaired gait/ balance, decrease ADL/ functional abilitiies, decrease activity tolerance, decrease flexibility/ joint mobility and decrease transfer abilities   Treatment Plan may include any combination of the following: Therapeutic exercise, Therapeutic activities, Neuromuscular re-education, Physical agent/modality, Gait/balance training, Manual therapy, Aquatic therapy, Patient education, Self Care training, Functional mobility training and Home safety training  Patient / Family readiness to learn indicated by: asking questions, trying to perform skills and interest  Persons(s) to be included in education: patient (P)  Barriers to Learning/Limitations: None  Patient Goal (s): ???To get motion back and have less pain in the arm.???  Patient Self Reported Health Status: good  Rehabilitation Potential: good    Short Term Goals: To be accomplished in 2 weeks:   1) Pt will have decreased left shoulder pain to > or = to 5/10 pain at worst to improved ADL tolerance.    @ Eval: 84/1310/10 at worst.    2) Pt will have increased left shoulder elevation to > or = to 165 degrees to improve dressing and grooming tasks.    @ Eval: left shoulder AROM: flexion 125 degrees  Scaption: 130 degrees   3) Pt will have increased left shoulder IR/ER to > 75 degrees to improved grooming and dressing tasks.    @ Eval: IR: T10/ right    L2/left      ER: C7/right  Occiput/left   4) Pt will be issued a HEP  and be I and compliant to promote carryover b/w therapy sessions.    @ Eval: HEP issued with cues to stay in pain tolerated ranges.     Long Term Goals: To be accomplished in 4 weeks:   1) Pt will have decreased left shoulder pain to > or = to 2/10 pain at worst to improved ADL tolerance.    @ Eval: 13/08 at worst.    2) Pt will have increased left shoulder elevation to > or = to 180  degrees to improve dressing and grooming tasks.    @ Eval: left shoulder AROM: flexion 125 degrees  Scaption: 130 degrees   3) Pt will have increased left shoulder IR/ER to > 90 degrees to improved grooming and dressing tasks.    @ Eval: IR: T10/ right    L2/left      ER: C7/right  Occiput/left   4) pt will have improved FOTO score to > or = to 60 points to show improved mobility    @ Eval: 50/100     Frequency / Duration: Patient to be seen 2 times per week for 4 weeks.    Patient/ Caregiver education and instruction: Diagnosis, prognosis, self care, activity modification, brace/ splint application and exercises     Plan of care has been reviewed with PTA    Molly [name], PT 06/11/2016 3:02 PM    ________________________________________________________________________    I certify that the above Therapy Services are being furnished while the patient is under my care. I agree with the treatment plan and certify that this therapy is necessary.    Physician's Signature:____________________  Date:____________Time: _________    Please sign and return to [facility]  [address] [name], [place] [address]  Ph [phone]  Fx [phone]

## 2016-06-13 ENCOUNTER — Inpatient Hospital Stay
Admit: 2016-06-13 | Payer: TRICARE (CHAMPUS) | Attending: Rehabilitative and Restorative Service Providers" | Primary: Family Medicine

## 2016-06-13 NOTE — Progress Notes (Signed)
PT DAILY TREATMENT NOTE 12-16    Patient Name: Molly [name]  Date:06/13/2016  DOB: May 31, 1956    Patient DOB Verified  Payor: TRICARE / Plan: [facility] TRICARE RETIREES AND DEPENDENTS / Product Type: Tricare /    In time:12:39  Out time:1:10  Total Treatment Time (min): 30  Visit #: 2 of 8    Treatment Area: Impingement syndrome of left shoulder [M75.42]    SUBJECTIVE  Pain Level (0-10 scale): 0  Any medication changes, allergies to medications, adverse drug reactions, diagnosis change, or new procedure performed?:  No     Yes (see summary sheet for update)  Subjective functional status/changes:    No changes reported   I have no pain right now. The shoulder feels looser.     OBJECTIVE    Modality rationale: Pt declined   Min Type Additional Details     Estim:  Unatt       IFC  Premod                        Other:  w/ice   w/heat  Position:  Location:     Estim: Att    TENS instruct  NMES                    Other:  w/US   w/ice   w/heat  Position:  Location:      Traction:  Cervical       Lumbar                        Prone          Supine                       Intermittent   Continuous Lbs:   before manual   after manual      Ultrasound: Continuous    Pulsed                             W/cm2:  Location:      Iontophoresis with dexamethasone         Location:  Take home patch    In clinic      Ice       heat    Ice massage    Laser     Anodyne Position:  Location:      Laser with stim    Other:  Position:  Location:      Vasopneumatic Device Pressure:        lo  med  hi   Temperature:  lo  med  hi    Skin assessment post-treatment:  intact redness- no adverse reaction    redness ??? adverse reaction:     30 min Therapeutic Exercise:   See flow sheet :   Rationale: increase ROM, increase strength, improve coordination, improve balance and increase proprioception to improve the patient???s ability to improve ADL function.           With    TE    TA    neuro    other: Patient Education:  Review HEP      Progressed/Changed HEP based on:    positioning    body mechanics    transfers    heat/ice application     other:      Other Objective/Functional Measures: Shoulder elevation  Pain at end range of motion.     Pain Level (0-10 scale) post treatment: 0-1    ASSESSMENT/Changes in Function: Pt reported mild soreness post session, however with there-ex, pt reported the arm was less stiff and able to move better.     Patient will continue to benefit from skilled PT services to modify and progress therapeutic interventions, address functional mobility deficits, address ROM deficits, address strength deficits, analyze and address soft tissue restrictions, analyze and cue movement patterns, analyze and modify body mechanics/ergonomics, assess and modify postural abnormalities and address imbalance/dizziness to attain remaining goals.       See Plan of Care    See progress note/recertification    See Discharge Summary         Progress towards goals / Updated goals:  Short Term Goals: To be accomplished in 2 weeks:                         1) Pt will have decreased left shoulder pain to > or = to 5/10 pain at worst to improved ADL tolerance.                          @ Eval: 10/10 at worst.     Current: n/a                           2) Pt will have increased left shoulder elevation to > or = to 165 degrees to improve dressing and grooming tasks.                          @ Eval: left shoulder AROM: flexion 125 degrees  Scaption: 130 degrees    Current; n/a                           3) Pt will have increased left shoulder IR/ER to > 75 degrees to improved grooming and dressing tasks.                          @ Eval: IR: T10/ right    L2/left      ER: C7/right  Occiput/left    Current: n/a                           4) Pt will be issued a HEP and be I and compliant to promote carryover b/w therapy sessions.                          @ Eval: HEP issued with cues to stay in pain tolerated ranges.     Current: n/a  ??   Long Term Goals: To be accomplished in 4 weeks:                         1) Pt will have decreased left shoulder pain to > or = to 2/10 pain at worst to improved ADL tolerance.                          @ Eval: 10/10 at worst.     Current: n/a  2) Pt will have increased left shoulder elevation to > or = to 180 degrees to improve dressing and grooming tasks.                          @ Eval: left shoulder AROM: flexion 125 degrees  Scaption: 130 degrees    Current: n/a                           3) Pt will have increased left shoulder IR/ER to > 90 degrees to improved grooming and dressing tasks.                          @ Eval: IR: T10/ right    L2/left      ER: C7/right  Occiput/left    Current: n/a                4) pt will have improved FOTO score to > or = to 60 points to show improved mobility                          @ Eval: 50/100     Current: n/a    PLAN    Upgrade activities as tolerated       Continue plan of care    Update interventions per flow sheet         Discharge due to:_    Other:_      Veda Canning, PT 06/13/2016  1:03 PM    Future Appointments  Date Time Provider Department Center   06/16/2016 1:30 PM Veda Canning, PT [facility]   06/18/2016 2:30 PM Veda Canning, PT [facility]   06/26/2016 12:30 PM Veda Canning, PT [facility]

## 2016-06-16 ENCOUNTER — Inpatient Hospital Stay
Admit: 2016-06-16 | Payer: TRICARE (CHAMPUS) | Attending: Rehabilitative and Restorative Service Providers" | Primary: Family Medicine

## 2016-06-16 NOTE — Progress Notes (Signed)
PT DAILY TREATMENT NOTE 12-16    Patient Name: Molly [name]  Date:06/16/2016  DOB: October 16, 1956    Patient DOB Verified  Payor: TRICARE / Plan: [facility] TRICARE RETIREES AND DEPENDENTS / Product Type: Tricare /    In time:1:35  Out time:2:35  Total Treatment Time (min): 60  Visit #: 3 of 8    Treatment Area: Impingement syndrome of left shoulder [M75.42]    SUBJECTIVE  Pain Level (0-10 scale): 0  Any medication changes, allergies to medications, adverse drug reactions, diagnosis change, or new procedure performed?:  No     Yes (see summary sheet for update)  Subjective functional status/changes:    No changes reported  I do get a slight sharp pain at times.     OBJECTIVE    Modality rationale: decrease inflammation, decrease pain and increase tissue extensibility to improve the patient???s ability to improve mobility    Min Type Additional Details     Estim:  Unatt       IFC  Premod                        Other:  w/ice   w/heat  Position:  Location:     Estim: Att    TENS instruct  NMES                    Other:  w/US   w/ice   w/heat  Position:  Location:      Traction:  Cervical       Lumbar                        Prone          Supine                       Intermittent   Continuous Lbs:   before manual   after manual      Ultrasound: Continuous    Pulsed                             W/cm2:  Location:      Iontophoresis with dexamethasone         Location:  Take home patch    In clinic   10   Ice       heat    Ice massage    Laser     Anodyne Position: supine   Location: left shoulder       Laser with stim    Other:  Position:  Location:      Vasopneumatic Device Pressure:        lo  med  hi   Temperature:  lo  med  hi    Skin assessment post-treatment:  intact redness- no adverse reaction     redness ??? adverse reaction:     38 min Therapeutic Exercise:   See flow sheet :   Rationale: increase ROM, increase strength, improve coordination and  improve balance to improve the patient???s ability to improve mobility     10 min Manual Therapy:  GH II/III mobs, distraction, MFR to Pecs and biceps tendon.    Rationale: decrease pain, increase ROM, increase tissue extensibility, decrease trigger points and increase postural awareness to improve ADL's.           With    TE  TA    neuro    other: Patient Education:  Review HEP     Progressed/Changed HEP based on:    positioning    body mechanics    transfers    heat/ice application     other:      Other Objective/Functional Measures: flexion 136 degrees     Pain Level (0-10 scale) post treatment: 0-1    ASSESSMENT/Changes in Function: pt making steady progression with ROM and strength.     Patient will continue to benefit from skilled PT services to modify and progress therapeutic interventions, address functional mobility deficits, address ROM deficits, address strength deficits, analyze and address soft tissue restrictions, analyze and cue movement patterns, analyze and modify body mechanics/ergonomics, assess and modify postural abnormalities and address imbalance/dizziness to attain remaining goals.       See Plan of Care    See progress note/recertification    See Discharge Summary         Progress towards goals / Updated goals:  Short Term Goals:??To be accomplished in 2??weeks:  ??????????????????????????????????????????????1) Pt will have decreased left shoulder pain to >??or = to 5/10 pain at worst to improved ADL tolerance.   ??????????????????????????????????????????????@ Eval: 10/10 at worst.                           Current: 0-3/10  ??  ??????????????????????????????????????????????2) Pt will have increased left shoulder elevation to >??or = to 165 degrees to improve dressing and grooming tasks.   ??????????????????????????????????????????????@ Eval: left shoulder AROM: flexion 125 degrees ??Scaption: 130 degrees                         Current; 136 degrees for both   ??  ??????????????????????????????????????????????3) Pt will have increased left shoulder IR/ER to >??75 degrees to improved grooming and dressing tasks.    ??????????????????????????????????????????????@ Eval: IR:??T10/ right ??????L2/left ??????????ER:??C7/right ??Occiput/left                         Current: n/a  ??  ??????????????????????????????????????????????4) Pt will be issued a HEP and be I and compliant to promote carryover b/w therapy sessions.   ??????????????????????????????????????????????@ Eval: HEP issued with cues to stay in pain tolerated ranges.                          Current: n/a  ????  Long Term Goals:??To be accomplished in 4??weeks:  ??????????????????????????????????????????????1) Pt will have decreased left shoulder pain to >??or = to 2/10 pain at worst to improved ADL tolerance.   ??????????????????????????????????????????????@ Eval: 10/10 at worst.                          Current: 0-3/10  ??  ??????????????????????????????????????????????2) Pt will have increased left shoulder elevation to >??or = to 180 degrees to improve dressing and grooming tasks.   ??????????????????????????????????????????????@ Eval: left shoulder AROM: flexion 125 degrees ??Scaption: 130 degrees                         Current: 136 in standing.   ??  ??????????????????????????????????????????????3) Pt will have increased left shoulder IR/ER to >??90 degrees to improved grooming and dressing tasks.   ??????????????????????????????????????????????@ Eval: IR: T10/ right ??????L2/left ??????????ER:??C7/right ??Occiput/left  Current: n/a  ??                        ??4) pt will have improved FOTO score to >??or = to 60 points to show improved mobility   ??????????????????????????????????????????????@ Eval: 50/100                           Current: n/a    PLAN    Upgrade activities as tolerated       Continue plan of care    Update interventions per flow sheet         Discharge due to:_    Other:_      Veda Canning, PT 06/16/2016  1:55 PM    Future Appointments  Date Time Provider Department Center   06/18/2016 2:30 PM Veda Canning, PT [facility]   06/26/2016 12:30 PM Veda Canning, PT [facility]

## 2016-06-18 ENCOUNTER — Inpatient Hospital Stay
Admit: 2016-06-18 | Payer: TRICARE (CHAMPUS) | Attending: Rehabilitative and Restorative Service Providers" | Primary: Family Medicine

## 2016-06-18 NOTE — Progress Notes (Signed)
PT DAILY TREATMENT NOTE 12-16    Patient Name: Molly [name]  Date:06/18/2016  DOB: [birth year]    Patient DOB Verified  Payor: TRICARE / Plan: [facility] [employer]ICARE RETIREES AND DEPENDENTS / Product Type: Tricare /    In time: 2:35  Out time: 3:25  Total Treatment Time (min): 50  Visit #: 4 of 8    Treatment Area: Impingement syndrome of left shoulder [M75.42]    SUBJECTIVE  Pain Level (0-10 scale): 3  Any medication changes, allergies to medications, adverse drug reactions, diagnosis change, or new procedure performed?:  No     Yes (see summary sheet for update)  Subjective functional status/changes:    No changes reported  Its getting better.     OBJECTIVE    Modality rationale: decrease inflammation and decrease pain to improve the patient???s ability to improve ADL's   Min Type Additional Details     Estim:  Unatt       IFC  Premod                        Other:  w/ice   w/heat  Position:  Location:     Estim: Att    TENS instruct  NMES                    Other:  w/US   w/ice   w/heat  Position:  Location:      Traction:  Cervical       Lumbar                        Prone          Supine                       Intermittent   Continuous Lbs:   before manual   after manual      Ultrasound: Continuous    Pulsed                             W/cm2:  Location:      Iontophoresis with dexamethasone         Location:  Take home patch    In clinic   10   Ice       heat    Ice massage    Laser     Anodyne Position: seated incline on plinth   Location: left shoulder       Laser with stim    Other:  Position:  Location:      Vasopneumatic Device Pressure:        lo  med  hi   Temperature:  lo  med  hi    Skin assessment post-treatment:  intact redness- no adverse reaction    redness ??? adverse reaction:       30 min Therapeutic Exercise:   See flow sheet :   Rationale: increase ROM, increase strength and improve coordination to improve the patient???s ability to improve mobility      10 min Manual Therapy:   GH mobs inferior and posterior   Rationale: decrease pain, increase ROM, increase tissue extensibility and decrease trigger points to improve mobility           With    TE    TA    neuro    other: Patient Education:  Review  HEP     Progressed/Changed HEP based on:    positioning    body mechanics    transfers    heat/ice application     other:      Other Objective/Functional Measures: improving tolerance for shoulder elevation.      Pain Level (0-10 scale) post treatment: 1    ASSESSMENT/Changes in Function: Pt making slow steady progression towards goals with minimal to no increase in ss/x of pain. Moderate TTP in      Patient will continue to benefit from skilled PT services to modify and progress therapeutic interventions, address functional mobility deficits, address ROM deficits, address strength deficits, analyze and address soft tissue restrictions, analyze and cue movement patterns, analyze and modify body mechanics/ergonomics, assess and modify postural abnormalities and address imbalance/dizziness to attain remaining goals.       See Plan of Care    See progress note/recertification    See Discharge Summary         Progress towards goals / Updated goals:  Short Term Goals:??To be accomplished in 2??weeks:  ??????????????????????????????????????????????1) Pt will have decreased left shoulder pain to >??or = to 5/10 pain at worst to improved ADL tolerance.   ??????????????????????????????????????????????@ Eval: 10/10 at worst.   ????????????????????????????????????????????????Current: 0-3/10  ????  ??????????????????????????????????????????????2) Pt will have increased left shoulder elevation to >??or = to 165 degrees to improve dressing and grooming tasks.   ??????????????????????????????????????????????@ Eval: left shoulder AROM: flexion 125 degrees ??Scaption: 130 degrees  ????????????????????????????????????????   Current; 136 degrees for both   ????  ??????????????????????????????????????????????3) Pt will have increased left shoulder IR/ER to >??75 degrees to improved grooming and dressing tasks.    ??????????????????????????????????????????????@ Eval: IR:??T10/ right ??????L2/left ??????????ER:??C7/right ??Occiput/left  ??????????????????????????????????????????????Current: n/a  ????  ??????????????????????????????????????????????4) Pt will be issued a HEP and be I and compliant to promote carryover b/w therapy sessions.   ??????????????????????????????????????????????@ Eval: HEP issued with cues to stay in pain tolerated ranges.   ??????????????????????????????????????????????Current: n/a  ????  Long Term Goals:??To be accomplished in 4??weeks:  ??????????????????????????????????????????????1) Pt will have decreased left shoulder pain to >??or = to 2/10 pain at worst to improved ADL tolerance.   ??????????????????????????????????????????????@ Eval: 10/10 at worst.   ??????????????????????????????????????????????Current: 0-3/10  ????  ??????????????????????????????????????????????2) Pt will have increased left shoulder elevation to >??or = to 180 degrees to improve dressing and grooming tasks.   ??????????????????????????????????????????????@ Eval: left shoulder AROM: flexion 125 degrees ??Scaption: 130 degrees  ??????????????????????????????????????????????Current: 136 in standing.   ????  ??????????????????????????????????????????????3) Pt will have increased left shoulder IR/ER to >??90 degrees to improved grooming and dressing tasks.   ??????????????????????????????????????????????@ Eval: IR: T10/ right ??????L2/left ??????????ER:??C7/right ??Occiput/left  ??????????????????????????????????????????????Current: progressing   ????  ??????????????????????????????????????????????4) pt will have improved FOTO score to >??or = to 60 points to show improved mobility   ??????????????????????????????????????????????@ Eval: 50/100   ????????????????????????????????????????????????Current: next apt    PLAN    Upgrade activities as tolerated       Continue plan of care    Update interventions per flow sheet         Discharge due to:_    Other:_      Veda Canning, PT 06/18/2016  4:19 PM    Future Appointments  Date Time Provider Department Center   06/26/2016 12:30 PM Veda Canning, PT [facility]

## 2016-06-26 ENCOUNTER — Inpatient Hospital Stay
Admit: 2016-06-26 | Payer: TRICARE (CHAMPUS) | Attending: Rehabilitative and Restorative Service Providers" | Primary: Family Medicine

## 2016-06-26 DIAGNOSIS — M7542 Impingement syndrome of left shoulder: Secondary | ICD-10-CM

## 2016-06-26 NOTE — Progress Notes (Addendum)
PT DAILY TREATMENT NOTE 12-16    Patient Name: Molly [name]  Date:06/26/2016  DOB: 1956-02-10    Patient DOB Verified  Payor: TRICARE / Plan: [facility] [employer]ARE RETIREES AND DEPENDENTS / Product Type: Tricare /    In time:12:35  Out time: 1:25  Total Treatment Time (min): 50  Visit #: 5 of 8    Treatment Area: Impingement syndrome of left shoulder [M75.42]    SUBJECTIVE  Pain Level (0-10 scale): 0  Any medication changes, allergies to medications, adverse drug reactions, diagnosis change, or new procedure performed?:  No     Yes (see summary sheet for update)  Subjective functional status/changes:    No changes reported  I have pain when I reach behind me.     OBJECTIVE    Modality rationale: decrease inflammation, decrease pain and increase muscle contraction/control to improve the patient???s ability to improve mobility    Min Type Additional Details     Estim:  Unatt       IFC  Premod                        Other:  w/ice   w/heat  Position:  Location:     Estim: Att    TENS instruct  NMES                    Other:  w/US   w/ice   w/heat  Position:  Location:      Traction:  Cervical       Lumbar                        Prone          Supine                       Intermittent   Continuous Lbs:   before manual   after manual      Ultrasound: Continuous    Pulsed                             W/cm2:  Location:      Iontophoresis with dexamethasone         Location:  Take home patch    In clinic   10   Ice       heat    Ice massage    Laser     Anodyne Position:supine  Location: Left shoulder       Laser with stim    Other:  Position:  Location:      Vasopneumatic Device Pressure:        lo  med  hi   Temperature:  lo  med  hi    Skin assessment post-treatment:  intact redness- no adverse reaction    redness ??? adverse reaction:     40 min Therapeutic Exercise:   See flow sheet :   Rationale: increase ROM, increase strength, improve coordination, improve  balance and increase proprioception to improve the patient???s ability to improve mobility           With    TE    TA    neuro    other: Patient Education:  Review HEP     Progressed/Changed HEP based on:    positioning    body mechanics    transfers    heat/ice application  other:         Objective:   Right UE AROM:  IR: 81  ER: 72 degrees    Flexion: 161 tested in supine  FOTO: 55/100    Pain Level (0-10 scale) post treatment: 0    ASSESSMENT/Changes in Function: pt still has pain with rotation and end range flexion, however AROM is improving.  Pt reports stretching does help minimize the pain.      Patient will continue to benefit from skilled PT services to modify and progress therapeutic interventions, address functional mobility deficits, address ROM deficits, address strength deficits, analyze and address soft tissue restrictions, analyze and cue movement patterns, analyze and modify body mechanics/ergonomics, assess and modify postural abnormalities and address imbalance/dizziness to attain remaining goals.       See Plan of Care    See progress note/recertification    See Discharge Summary         Progress towards goals / Updated goals:  Short Term Goals:??To be accomplished in 2??weeks:  ??????????????????????????????????????????????1) Pt will have decreased left shoulder pain to >??or = to 5/10 pain at worst to improved ADL tolerance.   ??????????????????????????????????????????????@ Eval: 10/10 at worst.   ????????????????????????????????????????????????Current: 0-3/10  ????  ??????????????????????????????????????????????2) Pt will have increased left shoulder elevation to >??or = to 165 degrees to improve dressing and grooming tasks.   ??????????????????????????????????????????????@ Eval: left shoulder AROM: flexion 125 degrees ??Scaption: 130 degrees  ???? ??????????????????????????????????????????Current: 161  degrees   ????  ??????????????????????????????????????????????3) Pt will have increased left shoulder IR/ER to >??75 degrees to improved grooming and dressing tasks.   ??????????????????????????????????????????????@ Eval: IR:??T10/ right ??????L2/left ??????????ER:??C7/right ??Occiput/left   ??????????????????????????????????????????????Current: IR: 81  ER: 72 degrees   ????  ??????????????????????????????????????????????4) Pt will be issued a HEP and be I and compliant to promote carryover b/w therapy sessions.   ??????????????????????????????????????????????@ Eval: HEP issued with cues to stay in pain tolerated ranges.   ??????????????????????????????????????????????Current:  50% compliant   ????  Long Term Goals:??To be accomplished in 4??weeks:  ??????????????????????????????????????????????1) Pt will have decreased left shoulder pain to >??or = to 2/10 pain at worst to improved ADL tolerance.   ??????????????????????????????????????????????@ Eval: 10/10 at worst.   ??????????????????????????????????????????????Current: 0-3/10  ????  ??????????????????????????????????????????????2) Pt will have increased left shoulder elevation to >??or = to 180 degrees to improve dressing and grooming tasks.   ??????????????????????????????????????????????@ Eval: left shoulder AROM: flexion 125 degrees ??Scaption: 130 degrees  ??????????????????????????????????????????????Current: 161 in standing.   ????  ??????????????????????????????????????????????3) Pt will have increased left shoulder IR/ER to >??90 degrees to improved grooming and dressing tasks.   ??????????????????????????????????????????????@ Eval: IR: T10/ right ??????L2/left ??????????ER:??C7/right ??Occiput/left  ??????????????????????????????????????????????Current: IR: 81  ER: 72 degrees  ????  ??????????????????????????????????????????????4) Pt will be issued a HEP and be I and compliant to promote carryover b/w therapy sessions.   ??????????????????????????????????????????????@ Eval: HEP issued with cues to stay in pain tolerated ranges.   ??????????????????????????????????????????????Current: 50%  compliant.   ????  PLAN    Upgrade activities as tolerated       Continue plan of care    Update interventions per flow sheet         Discharge due to:_    Other:_      Veda Canning, PT 06/26/2016  12:52 PM    No future appointments.

## 2016-06-30 ENCOUNTER — Inpatient Hospital Stay: Admit: 2016-06-30 | Payer: TRICARE (CHAMPUS) | Primary: Family Medicine

## 2016-06-30 NOTE — Progress Notes (Signed)
PT DAILY TREATMENT NOTE - MCR 12-16    Patient Name: Molly [name]  Date:06/30/2016  DOB: 03-28-1956    Patient DOB Verified  Payor: TRICARE / Plan: [facility] TRICARE RETIREES AND DEPENDENTS / Product Type: T[employer]are /    In time:8  Out time:  Total Treatment Time (min): 850  Total Timed Codes (min): 40  1:1 Treatment Time (MC only): na   Visit #: 6 of 8    Treatment Area: Impingement syndrome of left shoulder [M75.42]    SUBJECTIVE  Pain Level (0-10 scale): 4 (stiff)  Any medication changes, allergies to medications, adverse drug reactions, diagnosis change, or new procedure performed?:  No     Yes (see summary sheet for update)  Subjective functional status/changes:    No changes reported  Reports generally low symptoms but has increase of stiffness today with some TTP in anterior L Sh    OBJECTIVE    Modality rationale: decrease pain and increase tissue extensibility    Min Type Additional Details     Estim:  Unatt       IFC  Premod                        Other:  w/ice   w/heat  Position:  Location:     Estim: Att    TENS instruct  NMES                    Other:  w/US   w/ice   w/heat  Position:  Location:      Traction:  Cervical       Lumbar                        Prone          Supine                       Intermittent   Continuous Lbs:   before manual   after manual      Ultrasound: Continuous    Pulsed                             W/cm2:  Location:      Iontophoresis with dexamethasone         Location:  Take home patch    In clinic   10   Ice       heat    Ice massage    Laser     Anodyne Position:supine  Location:L SH      Laser with stim    Other:  Position:  Location:      Vasopneumatic Device Pressure:        lo  med  hi   Temperature:  lo  med  hi    Skin assessment post-treatment:  intact redness- no adverse reaction    redness ??? adverse reaction:       35 min Therapeutic Exercise:   See flow sheet :   Rationale: increase ROM, increase strength and improve coordination      5 min Manual Therapy:  functional massage to L biceps in supine with CFM to LH proximal attachment   Rationale: decrease pain, increase ROM and increase tissue extensibility           With    TE    TA    neuro  other: Patient Education:  Review HEP     Progressed/Changed HEP based on:    positioning    body mechanics    transfers    heat/ice application     other:      Other Objective/Functional Measures:   TTP L biceps LH proximal attachement     Pain Level (0-10 scale) post treatment: 0    ASSESSMENT/Changes in Function:   Marked weakness in L UE with supine DB chest press compared to R.   Good response to manual therapy with decrease of L anterior sh tightness.    Patient will continue to benefit from skilled PT services to modify and progress therapeutic interventions, address functional mobility deficits, address ROM deficits and address strength deficits to attain remaining goals.       See Plan of Care    See progress note/recertification    See Discharge Summary         Progress towards goals / Updated goals:  Short Term Goals:??To be accomplished in 2??weeks:  ??????????????????????????????????????????????1) Pt will have decreased left shoulder pain to >??or = to 5/10 pain at worst to improved ADL tolerance.   ??????????????????????????????????????????????@ Eval: 10/10 at worst.   ????????????????????????????????????????????????Current: 0-3/10  ????  ??????????????????????????????????????????????2) Pt will have increased left shoulder elevation to >??or = to 165 degrees to improve dressing and grooming tasks.   ??????????????????????????????????????????????@ Eval: left shoulder AROM: flexion 125 degrees ??Scaption: 130 degrees  ???? ??????????????????????????????????????????Current: 161  degrees   ????  ??????????????????????????????????????????????3) Pt will have increased left shoulder IR/ER to >??75 degrees to improved grooming and dressing tasks.   ??????????????????????????????????????????????@ Eval: IR:??T10/ right ??????L2/left ??????????ER:??C7/right ??Occiput/left  ??????????????????????????????????????????????Current: IR: 81  ER: 72 degrees   ????  ??????????????????????????????????????????????4) Pt will be issued a HEP and be I and compliant  to promote carryover b/w therapy sessions.   ??????????????????????????????????????????????@ Eval: HEP issued with cues to stay in pain tolerated ranges.   ??????????????????????????????????????????????Current:  50% compliant   ????  Long Term Goals:??To be accomplished in 4??weeks:  ??????????????????????????????????????????????1) Pt will have decreased left shoulder pain to >??or = to 2/10 pain at worst to improved ADL tolerance.   ??????????????????????????????????????????????@ Eval: 10/10 at worst.   ??????????????????????????????????????????????Current: 0-3/10  ????  ??????????????????????????????????????????????2) Pt will have increased left shoulder elevation to >??or = to 180 degrees to improve dressing and grooming tasks.   ??????????????????????????????????????????????@ Eval: left shoulder AROM: flexion 125 degrees ??Scaption: 130 degrees  ??????????????????????????????????????????????Current: 161 in standing.   ????  ??????????????????????????????????????????????3) Pt will have increased left shoulder IR/ER to >??90 degrees to improved grooming and dressing tasks.   ??????????????????????????????????????????????@ Eval: IR: T10/ right ??????L2/left ??????????ER:??C7/right ??Occiput/left  ??????????????????????????????????????????????Current: IR: 81  ER: 72 degrees  ????  ??????????????????????????????????????????????4) Pt will be issued a HEP and be I and compliant to promote carryover b/w therapy sessions.   ??????????????????????????????????????????????@ Eval: HEP issued with cues to stay in pain tolerated ranges.   ??????????????????????????????????????????????Current: 50%  compliant.   ????    PLAN    Upgrade activities as tolerated       Continue plan of care    Update interventions per flow sheet         Discharge due to:_    Other:_      Beatris SiJames D Sylvan Lahm, PTA 06/30/2016  8:08 AM    Future Appointments  Date Time Provider Department Center   07/09/2016 11:30 AM Rikki Spearing[facility]ine M [facility], [facility]   07/14/2016 10:30 AM Rikki Spearinghristine M Titko[facility]

## 2016-07-09 ENCOUNTER — Inpatient Hospital Stay
Admit: 2016-07-09 | Payer: TRICARE (CHAMPUS) | Attending: Rehabilitative and Restorative Service Providers" | Primary: Family Medicine

## 2016-07-09 NOTE — Progress Notes (Signed)
PT DAILY TREATMENT NOTE - MCR 3-16    Patient Name: Molly [name]  Date:07/09/2016  DOB: 25-Aug-1956    Patient DOB Verified  Payor: TRICARE / Plan: [facility] [employer]ARE RETIREES AND DEPENDENTS / Product Type: Tricare /    In time:11:35  Out time:12:35  Total Treatment Time (min): 60  Visit #: 7 of 8    Treatment Area: Impingement syndrome of left shoulder [M75.42]    SUBJECTIVE  Pain Level (0-10 scale): 4  Any medication changes, allergies to medications, adverse drug reactions, diagnosis change, or new procedure performed?:  No     Yes (see summary sheet for update)  Subjective functional status/changes:    No changes reported  It is mostly stiffness.     OBJECTIVE    Modality rationale: decrease inflammation, decrease pain and increase tissue extensibility to improve the patient???s ability to improve mobility    Min Type Additional Details     Estim:  Unatt       IFC  Premod                        Other:  w/ice   w/heat  Position:  Location:     Estim: Att    TENS instruct  NMES                    Other:  w/US   w/ice   w/heat  Position:  Location:      Traction:  Cervical       Lumbar                        Prone          Supine                       Intermittent   Continuous Lbs:   before manual   after manual      Ultrasound: Continuous    Pulsed                           1MHz   3MHz Location:  W/cm2:      Iontophoresis with dexamethasone         Location:  Take home patch    In clinic   10   Ice       heat    Ice massage    Laser     Anodyne Position: seated   Location: left shoulder       Laser with stim    Other: Position:  Location:      Vasopneumatic Device Pressure:        lo  med  hi   Temperature:  lo  med  hi    Skin assessment post-treatment:  intact redness- no adverse reaction    redness ??? adverse reaction:     42 min Therapeutic Exercise:   See flow sheet :   Rationale: increase ROM, increase strength, improve coordination, improve balance and increase proprioception to improve the patient???s ability to  improve mobility       8 min Manual Therapy:  PROM, GH III/IV mobs in all planes, arm pull    Rationale: decrease pain, increase ROM, increase tissue extensibility and decrease trigger points to improve ADLs          With    TE    TA    neuro  other: Patient Education:  Review HEP     Progressed/Changed HEP based on:    positioning    body mechanics    transfers    heat/ice application     other:      Other Objective/Functional Measures: AROM: Flexion: 156/161  IR: 88 ??ER @ 90 ABD: 72 degrees     Pain Level (0-10 scale) post treatment: 1    ASSESSMENT/Changes in Function: Pt is making slow steady progress towards her goals for increasing AROM. Pt still has difficulty reaching overhead and lifting objects with left shoulerPt still needs manual PT to improve joint and soft tissue mobility. Pt has moderate TTP at anterior aspect of left shoulder at Biceps and pec minor origin and insertions. Pt to continue to benefit from skilled PT services.     Patient will continue to benefit from skilled PT services to modify and progress therapeutic interventions, address functional mobility deficits, address ROM deficits, address strength deficits, analyze and address soft tissue restrictions, analyze and cue movement patterns, analyze and modify body mechanics/ergonomics, assess and modify postural abnormalities and address imbalance/dizziness to attain remaining goals.       See Plan of Care    See progress note/recertification    See Discharge Summary         Progress towards goals / Updated goals:  Short Term Goals:??To be accomplished in 2??weeks:  ??????????????????????????????????????????????1) Pt will have decreased left shoulder pain to >??or = to 5/10 pain at worst to improved ADL tolerance.   ??????????????????????????????????????????????@ Eval: 10/10 at worst.   ????????????????????????????????????????????????Current: 0-5/10  ????  ??????????????????????????????????????????????2) Pt will have increased left shoulder elevation to >??or = to 165 degrees to improve dressing and grooming tasks.    ??????????????????????????????????????????????@ Eval: left shoulder AROM: flexion 125 degrees ??Scaption: 130 degrees  ????????????????????????????????????????????????Current: 156/161 deg A/PROM ??   ????  ??????????????????????????????????????????????3) Pt will have increased left shoulder IR/ER to >??75 degrees to improved grooming and dressing tasks.   ??????????????????????????????????????????????@ Eval: IR:??T10/ right ??????L2/left ??????????ER:??C7/right ??Occiput/left  ??????????????????????????????????????????????Current: IR: 88 ??ER: 72 degrees   ????  ??????????????????????????????????????????????4) Pt will be issued a HEP and be I and compliant to promote carryover b/w therapy sessions.   ??????????????????????????????????????????????@ Eval: HEP issued with cues to stay in pain tolerated ranges.   ??????????????????????????????????????????????Current: not met, 75% compliant   ????  Long Term Goals:??To be accomplished in 4??weeks:  ??????????????????????????????????????????????1) Pt will have decreased left shoulder pain to >??or = to 2/10 pain at worst to improved ADL tolerance.   ??????????????????????????????????????????????@ Eval: 10/10 at worst.   ??????????????????????????????????????????????Current: not met, 0-5/10  More stiffness than pain  ????  ??????????????????????????????????????????????2) Pt will have increased left shoulder elevation to >??or = to 180 degrees to improve dressing and grooming tasks.   ??????????????????????????????????????????????@ Eval: left shoulder AROM: flexion 125 degrees ??Scaption: 130 degrees  ??????????????????????????????????????????????Current: not met, 156 deg  in supine  ????  ??????????????????????????????????????????????3) Pt will have increased left shoulder IR/ER to >??90 degrees to improved grooming and dressing tasks.   ??????????????????????????????????????????????@ Eval: IR: T10/ right ??????L2/left ??????????ER:??C7/right ??Occiput/left  ??????????????????????????????????????????????Current: Not met, IR: 88 ??ER: 72 degrees  ????  ??????????????????????????????????????????????4) Pt will be issued a HEP and be I and compliant to promote carryover b/w therapy sessions.   ??????????????????????????????????????????????@ Eval: HEP issued with cues to stay in pain tolerated ranges.   ??????????????????????????????????????????????Current: Not met, 75% ??compliant.     PLAN    Upgrade activities as tolerated       Continue plan of care     Update interventions per flow sheet  Discharge due to:_    Other:_  Re-assess next apt.     Arbutus Leas, PT 07/09/2016  12:10 PM

## 2016-07-14 ENCOUNTER — Inpatient Hospital Stay
Admit: 2016-07-14 | Payer: TRICARE (CHAMPUS) | Attending: Rehabilitative and Restorative Service Providers" | Primary: Family Medicine

## 2016-07-14 NOTE — Progress Notes (Signed)
PT DAILY TREATMENT NOTE 12-16    Patient Name: Molly [name]  Date:07/14/2016  DOB: 01/28/1956    Patient DOB Verified  Payor: TRICARE / Plan: [facility] [employer]ARE RETIREES AND DEPENDENTS / Product Type: Tricare /    In time: 10:35  Out time: 11:35  Total Treatment Time (min): 55  Visit #: 8 of 8    Treatment Area: Impingement syndrome of left shoulder [M75.42]    SUBJECTIVE  Pain Level (0-10 scale): 2  Any medication changes, allergies to medications, adverse drug reactions, diagnosis change, or new procedure performed?:  No     Yes (see summary sheet for update)  Subjective functional status/changes:    No changes reported  I feel I have improved 75% thus far. Reaching behind me is still a challenge     OBJECTIVE    Modality rationale: decrease inflammation, decrease pain and increase tissue extensibility to improve the patient???s ability to improve mobility    Min Type Additional Details     Estim:  Unatt       IFC  Premod                        Other:  w/ice   w/heat  Position:  Location:     Estim: Att    TENS instruct  NMES                    Other:  w/US   w/ice   w/heat  Position:  Location:      Traction:  Cervical       Lumbar                        Prone          Supine                       Intermittent   Continuous Lbs:   before manual   after manual      Ultrasound: Continuous    Pulsed                           1MHz   3MHz W/cm2:  Location:      Iontophoresis with dexamethasone         Location:  Take home patch    In clinic   10   Ice       heat    Ice massage    Laser     Anodyne Position: supine   Location: left shoulder       Laser with stim    Other:  Position:  Location:      Vasopneumatic Device Pressure:        lo  med  hi   Temperature:  lo  med  hi    Skin assessment post-treatment:  intact redness- no adverse reaction    redness ??? adverse reaction:     10 min Eval                  Re-Eval       35 min Therapeutic Exercise:   See flow sheet :    Rationale: increase ROM, increase strength, improve coordination, improve balance and increase proprioception to improve the patient???s ability to improve mobility          With    TE    TA  neuro    other: Patient Education:  Review HEP     Progressed/Changed HEP based on:    positioning    body mechanics    transfers    heat/ice application     other:      Other Objective/Functional Measures:   Left shoulder AROM:    IR: 81  ER: 72 degrees    Flexion/Scaption A/PROM: 161/178  tested in supine  MMT: 4+/5  FOTO: 55/100     Pain Level (0-10 scale) post treatment: 0    ASSESSMENT/Changes in Function:  See PN. Continue manual therapy and there-ex to increase rotation and joint mobility to improve elevation for ADL's.     Patient will continue to benefit from skilled PT services to modify and progress therapeutic interventions, address functional mobility deficits, address ROM deficits, address strength deficits, analyze and address soft tissue restrictions, analyze and cue movement patterns, analyze and modify body mechanics/ergonomics and assess and modify postural abnormalities to attain remaining goals.       See Plan of Care    See progress note/recertification    See Discharge Summary         Progress towards goals / Updated goals:  See PN     PLAN    Upgrade activities as tolerated       Continue plan of care    Update interventions per flow sheet         Discharge due to:_    Other:_  Continue 2 x week x 4 weeks.     Veda CanningArkena L Trinka Keshishyan, PT 07/14/2016  10:29 AM    Future Appointments  Date Time Provider Department Center   07/14/2016 10:30 AM Veda CanningArkena L Avion Kutzer, PT [facility]

## 2016-07-14 NOTE — Progress Notes (Signed)
[facility] [place] [address] [place] [address]  Ph [phone]  Fx [phone]    Physical Therapy Progress Note  Patient name: Molly [name] Start of Care: 06/11/2016   Referral source: Marinell Blight, [place] DOB: May 20, 1956   Medical/Treatment Diagnosis: Impingement syndrome of left shoulder [M75.42] Onset Date:April 2016     Prior Hospitalization: see medical history Provider#: [id number]   Medications: Verified on Patient Summary List    Comorbidities: DM, Thyroid, HTN, Chronic hip and back pain    Prior Level of Function: Pt amb intermittently with SPC, I with ADL's and IADL's.     Visits from Start of Care: 8    Missed Visits: 0    Established Goals:         Excellent           Good         Limited           None   Increased ROM            Increased Strength            Increased Mobility             Decreased Pain            Decreased Swelling            Key Functional Changes:  Pt reports 75% improvement with function since starting PT. Pt is showing slow progression towards increased ROM and strength of her left shoulder. ROM and strength have not returned to PLOF, however pain has declined from 10/10 to 4/10 at worst. Pt still has most difficulty with IR for grooming and dressing tasks. Pt will continue to benefit from further PT services to address remaining goals.      Other Objective/Functional Measures:   Left shoulder AROM:    IR: 81 ??ER: 72 degrees ??  Flexion/Scaption A/PROM: 161/178  tested in supine  MMT: 4+/5  FOTO: 55/100               Short Term Goals:??To be accomplished in 2??weeks:  ??????????????????????????????????????????????1) Pt will have decreased left shoulder pain to >??or = to 5/10 pain at worst to improved ADL tolerance.   ??????????????????????????????????????????????@ Eval: 10/10 at worst.   ????????????????????????????????????????????????Current: MET, 0-4/10 at worst   ????  ??????????????????????????????????????????????2) Pt will have increased left shoulder elevation to >??or = to 165 degrees to improve dressing and grooming tasks.    ??????????????????????????????????????????????@ Eval: left shoulder AROM: flexion 125 degrees ??Scaption: 130 degrees  ????????????????????????????????????????????????Current: not met, 161/178  deg A/PROM ??   ????  ??????????????????????????????????????????????3) Pt will have increased left shoulder IR/ER to >??75 degrees to improved grooming and dressing tasks.   ??????????????????????????????????????????????@ Eval: IR:??T10/ right ??????L2/left ??????????ER:??C7/right ??Occiput/left  ??????????????????????????????????????????????Current: Not met, IR: 88 ??ER: 72 degrees   ????  ??????????????????????????????????????????????4) Pt will be issued a HEP and be I and compliant to promote carryover b/w therapy sessions.   ??????????????????????????????????????????????@ Eval: HEP issued with cues to stay in pain tolerated ranges.   ??????????????????????????????????????????????Current: not met, 75% compliant   ????  Long Term Goals:??To be accomplished in 4??weeks:  ??????????????????????????????????????????????1) Pt will have decreased left shoulder pain to >??or = to 2/10 pain at worst to improved ADL tolerance.   ??????????????????????????????????????????????@ Eval: 10/10 at worst.   ??????????????????????????????????????????????Current: not met, 0-4/10  More stiffness than pain  ????  ??????????????????????????????????????????????2) Pt will have increased left shoulder elevation to >??or = to 180 degrees to improve dressing and  grooming tasks.   ??????????????????????????????????????????????@ Eval: left shoulder AROM: flexion 125 degrees ??Scaption: 130 degrees  ??????????????????????????????????????????????Current: not met, 161 deg  in supine  ????  ??????????????????????????????????????????????3) Pt will have increased left shoulder IR/ER to >??90 degrees to improved grooming and dressing tasks.   ??????????????????????????????????????????????@ Eval: IR: T10/ right ??????L2/left ??????????ER:??C7/right ??Occiput/left  ??????????????????????????????????????????????Current: Not met, IR: 88 ??ER: 72 degrees  ????  ??????????????????????????????????????????????4) Pt will be issued a HEP and be I and compliant to promote carryover b/w therapy sessions.   ??????????????????????????????????????????????@ Eval: HEP issued with cues to stay in pain tolerated ranges.   ??????????????????????????????????????????????Current: Not met, 75% ??compliant.   ??  Updated Goals: to be achieved in 4 weeks:    Continue with unmet goals.     ASSESSMENT/RECOMMENDATIONS:  Continue therapy per initial plan/protocol at a frequency of  2 x per week for 4 weeks    Thank you for this referral.   Arbutus Leas, PT 07/14/2016 12:07 PM    NOTE TO PHYSICIAN:  PLEASE COMPLETE THE ORDERS BELOW AND   FAX TO [facility]: [phone]  If you are unable to process this request in 24 hours please contact our office: [phone]      ____ I have read the above report and request that my patient continue therapy with the following changes/special instructions:  ____ I have read the above report and request that my patient be discharged from therapy    Physician's Signature:_____________________ Date:___________Time:__________

## 2016-07-23 ENCOUNTER — Encounter: Payer: TRICARE (CHAMPUS) | Attending: Rehabilitative and Restorative Service Providers" | Primary: Family Medicine

## 2016-07-23 DIAGNOSIS — G253 Myoclonus: Secondary | ICD-10-CM | POA: Insufficient documentation

## 2016-07-23 DIAGNOSIS — E010 Iodine-deficiency related diffuse (endemic) goiter: Secondary | ICD-10-CM | POA: Insufficient documentation

## 2016-07-23 DIAGNOSIS — M4810 Ankylosing hyperostosis [Forestier], site unspecified: Secondary | ICD-10-CM | POA: Insufficient documentation

## 2016-07-23 DIAGNOSIS — G4733 Obstructive sleep apnea (adult) (pediatric): Secondary | ICD-10-CM | POA: Insufficient documentation

## 2016-07-24 ENCOUNTER — Encounter: Primary: Family Medicine

## 2016-07-25 ENCOUNTER — Inpatient Hospital Stay: Admit: 2016-07-25 | Discharge: 2016-07-25 | Payer: TRICARE (CHAMPUS) | Primary: Family Medicine

## 2016-07-25 DIAGNOSIS — M7542 Impingement syndrome of left shoulder: Secondary | ICD-10-CM

## 2016-07-25 NOTE — Progress Notes (Signed)
PT DAILY TREATMENT NOTE 12-16    Patient Name: Molly [name]  Date:07/25/2016  DOB: 10-31-1956    Patient DOB Verified  Payor: TRICARE / Plan: [place] DEPENDENTS / Product Type: Tricare /    In time:1035  Out time:1125  Total Treatment Time (min): 50  Visit #: 9 of 16    Treatment Area: Impingement syndrome of left shoulder [M75.42]    SUBJECTIVE  Pain Level (0-10 scale): 0/10  Any medication changes, allergies to medications, adverse drug reactions, diagnosis change, or new procedure performed?:  No     Yes (see summary sheet for update)  Subjective functional status/changes:    No changes reported      OBJECTIVE    Modality rationale: decrease pain and increase tissue extensibility to improve the patient???s ability to increase activity/position tolerance   Min Type Additional Details     Estim:  Unatt       IFC  Premod                        Other:  w/ice   w/heat  Position:  Location:     Estim: Att    TENS instruct  NMES                    Other:  w/US   w/ice   w/heat  Position:  Location:      Traction:  Cervical       Lumbar                        Prone          Supine                       Intermittent   Continuous Lbs:   before manual   after manual      Ultrasound: Continuous    Pulsed                           1MHz   3MHz W/cm2:  Location:      Iontophoresis with dexamethasone         Location:  Take home patch    In clinic   10   Ice       heat    Ice massage    Laser     Anodyne Position: H/L supine  Location:left shoulder      Laser with stim    Other:  Position:  Location:      Vasopneumatic Device Pressure:        lo  med  hi   Temperature:  lo  med  hi    Skin assessment post-treatment:  intact redness- no adverse reaction    redness ??? adverse reaction:      min Eval                  Re-Eval        32 min Therapeutic Exercise:   See flow sheet :   Rationale: increase ROM and increase strength to improve the patient???s ability to utilize left UE with ADL's      min Therapeutic Activity:    See flow sheet :         min Neuromuscular Re-education:    See flow sheet :       8 min Manual Therapy:  Joint  distraction/oscillation, GHJ mobs, PROM in H/L supine   Rationale: increase ROM and increase tissue extensibility to restore normal joint mobility for ADL's     min Gait Training:  ___ feet with ___ device on level surfaces with ___ level of assist   Rationale:          With    TE    TA    neuro    other: Patient Education:  Review HEP     Progressed/Changed HEP based on:    positioning    body mechanics    transfers    heat/ice application     other:      Other Objective/Functional Measures: none taken today     Pain Level (0-10 scale) post treatment: 0/10    ASSESSMENT/Changes in Function: No new progress.    Patient will continue to benefit from skilled PT services to modify and progress therapeutic interventions, address ROM deficits, address strength deficits, analyze and address soft tissue restrictions, analyze and cue movement patterns and assess and modify postural abnormalities to attain remaining goals.       See Plan of Care    See progress note/recertification    See Discharge Summary         Progress towards goals / Updated goals:  Short Term Goals:??To be accomplished in 2??weeks  ????????????????????????????????????   ????2) Pt will have increased left shoulder elevation to >??or = to 165 degrees to improve dressing and grooming tasks.   ??????????????????????????????????????????????@ Eval: left shoulder AROM: flexion 125 degrees ??Scaption: 130 degrees    Status at last note: not met, 161/178  deg A/PROM??????  ????????????????????????????????????????????????Current: NT  ????  ??????????????????????????????????????????????3) Pt will have increased left shoulder IR/ER to >??75 degrees to improved grooming and dressing tasks.   ??????????????????????????????????????????????@ Eval: IR:??T10/ right ??????L2/left ??????????ER:??C7/right ??Occiput/left    Status at last note:Not met, IR: 88????ER: 72 degrees   ??????????????????????????????????????????????Current: NT  ????   ??????????????????????????????????????????????4) Pt will be issued a HEP and be I and compliant to promote carryover b/w therapy sessions.   ??????????????????????????????????????????????@ Eval: HEP issued with cues to stay in pain tolerated ranges.     Status at last note:not met, 75% compliant   ??????????????????????????????????????????????Current: NT  ????  Long Term Goals:??To be accomplished in 4??weeks:  ??????????????????????????????????????????????1) Pt will have decreased left shoulder pain to >??or = to 2/10 pain at worst to improved ADL tolerance.   ??????????????????????????????????????????????@ Eval: 10/10 at worst.     Status at last note:not met, 0-4/10 ??More stiffness than pain  ??????????????????????????????????????????????Current: NT  ????  ??????????????????????????????????????????????2) Pt will have increased left shoulder elevation to >??or = to 180 degrees to improve dressing and grooming tasks.   ??????????????????????????????????????????????@ Eval: left shoulder AROM: flexion 125 degrees ??Scaption: 130 degrees    Status at last note:not met, 161 deg ??in supine  ??????????????????????????????????????????????Current: NT  ????  ??????????????????????????????????????????????3) Pt will have increased left shoulder IR/ER to >??90 degrees to improved grooming and dressing tasks.   ??????????????????????????????????????????????@ Eval: IR: T10/ right ??????L2/left ??????????ER:??C7/right ??Occiput/left    Status at last note:Not met, IR: 88????ER: 72 degrees  ??????????????????????????????????????????????Current: NT  ????  ??????????????????????????????????????????????4) Pt will be issued a HEP and be I and compliant to promote carryover b/w therapy sessions.   ??????????????????????????????????????????????@ Eval: HEP issued with cues to stay in pain tolerated ranges.     Status at last note:Not met, 75% ??compliant.   ??????????????????????????????????????????????Current: NT  ??    PLAN    Upgrade activities as tolerated       Continue plan  of care    Update interventions per flow sheet         Discharge due to:_    Other:_      Carlyon Prows, PT 07/25/2016  10:41 AM    Future Appointments  Date Time Provider [place]   07/29/2016 3:00 PM Arbutus Leas, [place] [facility]   07/31/2016 3:00 PM Camelia Phenes, PT [facility]    08/04/2016 2:00 PM [facility]us Leas, PT [facility]   08/06/2016 8:00 AM [facility]

## 2016-07-29 ENCOUNTER — Inpatient Hospital Stay
Admit: 2016-07-29 | Payer: TRICARE (CHAMPUS) | Attending: Rehabilitative and Restorative Service Providers" | Primary: Family Medicine

## 2016-07-29 NOTE — Progress Notes (Signed)
PT DAILY TREATMENT NOTE - MCR 3-16    Patient Name: Molly [name]  Date:07/29/2016  DOB: 08-Jan-1956    Patient DOB Verified  Payor: TRICARE / Plan: [facility] [employer] DEPENDENTS / Product Type: Tricare /    In time: 3:00  Out time: 4:00  Total Treatment Time (min): 56  Visit #: 10 of 16    Treatment Area: Impingement syndrome of left shoulder [M75.42]    SUBJECTIVE  Pain Level (0-10 scale): 0 now at worst 4   Any medication changes, allergies to medications, adverse drug reactions, diagnosis change, or new procedure performed?:  No     Yes (see summary sheet for update)  Subjective functional status/changes:    No changes reported  I have stiffness in the left shoulder still at worst after some exercises it can get to a 4.    OBJECTIVE    Modality rationale: decrease inflammation, decrease pain and increase tissue extensibility to improve the patient???s ability to improve mobility    Min Type Additional Details     Estim:  Unatt       IFC  Premod                        Other:  w/ice   w/heat  Position:  Location:     Estim: Att    TENS instruct  NMES                    Other:  w/US   w/ice   w/heat  Position:  Location:      Traction:  Cervical       Lumbar                        Prone          Supine                       Intermittent   Continuous Lbs:   before manual   after manual      Ultrasound: Continuous    Pulsed                           1MHz   3MHz Location:  W/cm2:      Iontophoresis with dexamethasone         Location:  Take home patch    In clinic   10   Ice       heat    Ice massage    Laser     Anodyne Position:supine  Location: left shoulder       Laser with stim    Other: Position:  Location:      Vasopneumatic Device Pressure:        lo  med  hi   Temperature:  lo  med  hi    Skin assessment post-treatment:  intact redness- no adverse reaction    redness ??? adverse reaction:     36 min Therapeutic Exercise:   See flow sheet :    Rationale: increase ROM, increase strength, improve coordination, improve balance and increase proprioception to improve the patient???s ability to improve mobility     10 min Manual Therapy:  STM, GH joint mobs III all planes, STM,    Rationale: decrease pain, increase ROM, increase tissue extensibility and decrease trigger points to improve mobility  With    TE    TA    neuro    other: Patient Education:  Review HEP     Progressed/Changed HEP based on:    positioning    body mechanics    transfers    heat/ice application     other:      Other Objective/Functional Measures: moderate TTP in biceps and at Biceps tendon.    FOTO: 55/100    Pain Level (0-10 scale) post treatment: 0    ASSESSMENT/Changes in Function: pt tolerated manual well and needs more MFR to biceps muscle and pecs on observation this date. Pt has a few trigger points in biceps and supraspinatus this date.     Patient will continue to benefit from skilled PT services to modify and progress therapeutic interventions, address functional mobility deficits, address ROM deficits, address strength deficits, analyze and address soft tissue restrictions, analyze and cue movement patterns, analyze and modify body mechanics/ergonomics and assess and modify postural abnormalities to attain remaining goals.       See Plan of Care    See progress note/recertification    See Discharge Summary         Progress towards goals / Updated goals:  Short Term Goals:??To be accomplished in 2??weeks  ????????????????????????????????????   ????2) Pt will have increased left shoulder elevation to >??or = to 165 degrees to improve dressing and grooming tasks.   ??????????????????????????????????????????????@ Eval: left shoulder AROM: flexion 125 degrees ??Scaption: 130 degrees                                              Status at last note: not met, 161/178 ??deg A/PROM??????  ????????????????????????????????????????????????Current: NT  ????  ??????????????????????????????????????????????3) Pt will have increased left shoulder IR/ER to  >??75 degrees to improved grooming and dressing tasks.   ??????????????????????????????????????????????@ Eval: IR:??T10/ right ??????L2/left ??????????ER:??C7/right ??Occiput/left                                              Status at last note:Not met, IR: 88????ER: 72 degrees   ??????????????????????????????????????????????Current: NT  ????  ??????????????????????????????????????????????4) Pt will be issued a HEP and be I and compliant to promote carryover b/w therapy sessions.   ??????????????????????????????????????????????@ Eval: HEP issued with cues to stay in pain tolerated ranges.                                               Status at last note:not met, 75% compliant   ??????????????????????????????????????????????Current: NT  ????  Long Term Goals:??To be accomplished in 4??weeks:  ??????????????????????????????????????????????1) Pt will have decreased left shoulder pain to >??or = to 2/10 pain at worst to improved ADL tolerance.   ??????????????????????????????????????????????@ Eval: 10/10 at worst.                                               Status at last note:not met, 0-4/10 ??More stiffness than pain  ??????????????????????????????????????????????Current: NT  ????  ??????????????????????????????????????????????2) Pt will have increased left shoulder elevation  to >??or = to 180 degrees to improve dressing and grooming tasks.   ??????????????????????????????????????????????@ Eval: left shoulder AROM: flexion 125 degrees ??Scaption: 130 degrees                                              Status at last note:not met, 161??deg ??in supine  ??????????????????????????????????????????????Current: NT  ????  ??????????????????????????????????????????????3) Pt will have increased left shoulder IR/ER to >??90 degrees to improved grooming and dressing tasks.   ??????????????????????????????????????????????@ Eval: IR: T10/ right ??????L2/left ??????????ER:??C7/right ??Occiput/left                                              Status at last note:Not met, IR: 88????ER: 72 degrees  ??????????????????????????????????????????????Current: NT  ????  ??????????????????????????????????????????????4) Pt will be issued a HEP and be I and compliant to promote carryover b/w therapy sessions.   ??????????????????????????????????????????????@ Eval: HEP issued with cues to stay in pain tolerated ranges.                                                Status at last note:Not met, 75% ??compliant.   ??????????????????????????????????????????????Current: NT  ??  PLAN    Upgrade activities as tolerated       Continue plan of care    Update interventions per flow sheet         Discharge due to:_    Other:_      Arbutus Leas, PT 07/29/2016  3:28 PM

## 2016-07-31 ENCOUNTER — Inpatient Hospital Stay
Admit: 2016-07-31 | Payer: TRICARE (CHAMPUS) | Attending: Rehabilitative and Restorative Service Providers" | Primary: Family Medicine

## 2016-07-31 NOTE — Progress Notes (Signed)
PT DAILY TREATMENT NOTE 12-16    Patient Name: Molly [name]  Date:07/31/2016  DOB: 1956-01-06    Patient DOB Verified  Payor: TRICARE / Plan: [place] DEPENDENTS / Product Type: Tricare /    In TDDU:2025  Out time:1551  Total Treatment Time (min): 56  Visit #: 11 of 16    Treatment Area: Impingement syndrome of left shoulder [M75.42]    SUBJECTIVE  Pain Level (0-10 scale): 0  Any medication changes, allergies to medications, adverse drug reactions, diagnosis change, or new procedure performed?:  No     Yes (see summary sheet for update)  Subjective functional status/changes:    No changes reported  Pt states able to reach up into cupboard get plate second shelf with no problem the other day     OBJECTIVE    ??  56 min Therapeutic Exercise:   See flow sheet :   Rationale: increase ROM, increase strength, improve coordination, improve balance and increase proprioception to improve the patient???s ability to improve mobility   ??          With    TE    TA    neuro    other: Patient Education:  Review HEP     Progressed/Changed HEP based on:    positioning    body mechanics    transfers    heat/ice application     other:      Other Objective/Functional Measures:Fe 130 deg in standing , scaption 105, ER72 deg arm at side         Pain Level (0-10 scale) post treatment: 0    ASSESSMENT/Changes in Function: pt responded well to repeated motions toward good side Right FE ,and right trunk rotation making positive increase ROM on opposite side and less pain immediately following ex (TMR concept )     Patient will continue to benefit from skilled PT services to modify and progress therapeutic interventions, address functional mobility deficits, address ROM deficits, address strength deficits, analyze and address soft tissue restrictions, analyze and cue movement patterns, analyze and modify body mechanics/ergonomics, assess and modify postural abnormalities and  address imbalance/dizziness to attain remaining goals.       See Plan of Care    See progress note/recertification    See Discharge Summary         Progress towards goals / Updated goals:  Short Term Goals:??To be accomplished in 2??weeks  ??????????????????????????????????????????????2) Pt will have increased left shoulder elevation to >??or = to 165 degrees to improve dressing and grooming tasks.   ??????????????????????????????????????????????@ Eval: left shoulder AROM: flexion 125 degrees ??Scaption: 130 degrees??  ????????????????????????????????????????????????Current: Fe 130 deg in standing , scaption 105  ????  ??????????????????????????????????????????????3) Pt will have increased left shoulder IR/ER to >??75 degrees to improved grooming and dressing tasks.   ??????????????????????????????????????????????@ Eval: IR:??T10/ right ??????L2/left ??????????ER:??C7/right ??Occiput/left  ????????????????????????????????????????????????????????????????????????????????????????Status at last note:Not met, IR: 88????ER: 72 degrees   ??????????????????????????????????????????????Current:ER 72 deg   ????  ??????????????????????????????????????????????4) Pt will be issued a HEP and be I and compliant to promote carryover b/w therapy sessions.   ??????????????????????????????????????????????@ Eval: HEP issued with cues to stay in pain tolerated ranges.   ????????????????????????????????????????????????????????????????????????????????????????Status at last note:not met, 75% compliant   ??????????????????????????????????????????????Current: progressing pt reporting compliance with HEP   ????  Long Term Goals:??To be accomplished in 4??weeks:  ??????????????????????????????????????????????1) Pt will have decreased left shoulder pain to >??or = to 2/10 pain at worst to improved ADL tolerance.   ??????????????????????????????????????????????@ Eval: 10/10 at worst.   ????????????????????????????????????????????????????????????????????????????????????????  Status at last note:not met, 0-4/10 ??More stiffness than pain  ??????????????????????????????????????????????Current:not met:  continued more stiffness than pain , but noted able to reach up to cupboard 2nd shelf no pain more fluid motion   ????  ??????????????????????????????????????????????2) Pt will have increased left shoulder elevation to >??or = to 180 degrees to improve dressing and grooming tasks.    ??????????????????????????????????????????????@ Eval: left shoulder AROM: flexion 125 degrees ??Scaption: 130 degrees  ????????????????????????????????????????????????????????????????????????????????????????Status at last note:not met, 161??deg ??in supine  ??????????????????????????????????????????????Current: FE 130 deg , abd 105 deg   ????  ??????????????????????????????????????????????3) Pt will have increased left shoulder IR/ER to >??90 degrees to improved grooming and dressing tasks.   ??????????????????????????????????????????????@ Eval: IR: T10/ right ??????L2/left ??????????ER:??C7/right ??Occiput/left  ????????????????????????????????????????????????????????????????????????????????????????Status at last note:Not met, IR: 88????ER: 72 degrees  ??????????????????????????????????????????????Current: 72 ER   ????  ??????????????????????????????????????????????4) Pt will be issued a HEP and be I and compliant to promote carryover b/w therapy sessions.   ??????????????????????????????????????????????@ Eval: HEP issued with cues to stay in pain tolerated ranges.   ????????????????????????????????????????????????????????????????????????????????????????Status at last note:Not met, 75% ??compliant.   ??????????????????????????????????????????????Current: progressing pt noting doing some ex with HEP   ??    PLAN    Upgrade activities as tolerated       Continue plan of care    Update interventions per flow sheet         Discharge due to:_    Other:_      Camelia Phenes, PT 07/31/2016  3:07 PM    Future Appointments  Date Time Provider [place]   08/04/2016 2:00 PM Arbutus Leas, PT [facility]   08/06/2016 8:00 AM [place]

## 2016-08-04 ENCOUNTER — Inpatient Hospital Stay
Admit: 2016-08-04 | Payer: TRICARE (CHAMPUS) | Attending: Rehabilitative and Restorative Service Providers" | Primary: Family Medicine

## 2016-08-04 NOTE — Progress Notes (Signed)
PT DAILY TREATMENT NOTE 12-16    Patient Name: Molly [name]  Date:08/04/2016  DOB: August 05, 1956    Patient DOB Verified  Payor: TRICARE / Plan: [facility] [employer]ARE RETIREES AND DEPENDENTS / Product Type: T[employer]are /    In time: 2:00  Out time: 2:43  Total Treatment Time (min): 43  Visit #: 12 of 16    Treatment Area: Impingement syndrome of left shoulder [M75.42]    SUBJECTIVE  Pain Level (0-10 scale): 0  Any medication changes, allergies to medications, adverse drug reactions, diagnosis change, or new procedure performed?:  No     Yes (see summary sheet for update)  Subjective functional status/changes:    No changes reported  I have some weakness still in the arm but it is getting better.     OBJECTIVE    Modality rationale: Declined.    Min Type Additional Details     Estim:  Unatt       IFC  Premod                        Other:  w/ice   w/heat  Position:  Location:     Estim: Att    TENS instruct  NMES                    Other:  w/US   w/ice   w/heat  Position:  Location:      Traction:  Cervical       Lumbar                        Prone          Supine                       Intermittent   Continuous Lbs:   before manual   after manual      Ultrasound: Continuous    Pulsed                           1MHz   3MHz W/cm2:  Location:      Iontophoresis with dexamethasone         Location:  Take home patch    In clinic      Ice       heat    Ice massage    Laser     Anodyne Position:  Location:      Laser with stim    Other:  Position:  Location:      Vasopneumatic Device Pressure:        lo  med  hi   Temperature:  lo  med  hi    Skin assessment post-treatment:  intact redness- no adverse reaction    redness ??? adverse reaction:               43 min Therapeutic Exercise:   See flow sheet :   Rationale: increase ROM, increase strength, improve coordination, improve balance and increase proprioception to improve the patient???s ability to improve mobility             With    TE    TA    neuro     other: Patient Education:  Review HEP     Progressed/Changed HEP based on:    positioning    body mechanics    transfers    heat/ice application  other:      Other Objective/Functional Measures: none      Pain Level (0-10 scale) post treatment: 0    ASSESSMENT/Changes in Function: Pt tolerated progression of resisted there-ex with use of 3# wt and use of green band from orange band. Pt denied any increase in s/sx post session.     Patient will continue to benefit from skilled PT services to modify and progress therapeutic interventions, address functional mobility deficits, address ROM deficits, address strength deficits, analyze and address soft tissue restrictions, analyze and cue movement patterns, analyze and modify body mechanics/ergonomics, assess and modify postural abnormalities and address imbalance/dizziness to attain remaining goals.       See Plan of Care    See progress note/recertification    See Discharge Summary         Progress towards goals / Updated goals:  Short Term Goals:??To be accomplished in 2??weeks  ??????????????????????????????????????????????2) Pt will have increased left shoulder elevation to >??or = to 165 degrees to improve dressing and grooming tasks.   ??????????????????????????????????????????????@ Eval: left shoulder AROM: flexion 125 degrees ??Scaption: 130 degrees??  ????????????????????????????????????????????????Current: Fe 130 deg in standing , scaption 105  ????  ??????????????????????????????????????????????3) Pt will have increased left shoulder IR/ER to >??75 degrees to improved grooming and dressing tasks.   ??????????????????????????????????????????????@ Eval: IR:??T10/ right ??????L2/left ??????????ER:??C7/right ??Occiput/left  ????????????????????????????????????????????????????????????????????????????????????????Status at last note:Not met, IR: 88????ER: 72 degrees   ??????????????????????????????????????????????Current:ER 72 deg   ????  ??????????????????????????????????????????????4) Pt will be issued a HEP and be I and compliant to promote carryover b/w therapy sessions.   ??????????????????????????????????????????????@ Eval: HEP issued with cues to stay in pain tolerated ranges.    ????????????????????????????????????????????????????????????????????????????????????????Status at last note:not met, 75% compliant   ??????????????????????????????????????????????Current: progressing pt reporting compliance with HEP   ????  Long Term Goals:??To be accomplished in 4??weeks:  ??????????????????????????????????????????????1) Pt will have decreased left shoulder pain to >??or = to 2/10 pain at worst to improved ADL tolerance.   ??????????????????????????????????????????????@ Eval: 10/10 at worst.   ????????????????????????????????????????????????????????????????????????????????????????Status at last note:not met, 0-4/10 ??More stiffness than pain  ??????????????????????????????????????????????Current:not met:  continued more stiffness than pain , but noted able to reach up to cupboard 2nd shelf no pain more fluid motion   ????  ??????????????????????????????????????????????2) Pt will have increased left shoulder elevation to >??or = to 180 degrees to improve dressing and grooming tasks.   ??????????????????????????????????????????????@ Eval: left shoulder AROM: flexion 125 degrees ??Scaption: 130 degrees  ????????????????????????????????????????????????????????????????????????????????????????Status at last note:not met, 161??deg ??in supine  ??????????????????????????????????????????????Current: FE 130 deg , abd 105 deg   ????  ??????????????????????????????????????????????3) Pt will have increased left shoulder IR/ER to >??90 degrees to improved grooming and dressing tasks.   ??????????????????????????????????????????????@ Eval: IR: T10/ right ??????L2/left ??????????ER:??C7/right ??Occiput/left  ????????????????????????????????????????????????????????????????????????????????????????Status at last note:Not met, IR: 88????ER: 72 degrees  ??????????????????????????????????????????????Current: 72 ER   ????  ??????????????????????????????????????????????4) Pt will be issued a HEP and be I and compliant to promote carryover b/w therapy sessions.   ??????????????????????????????????????????????@ Eval: HEP issued with cues to stay in pain tolerated ranges.   ????????????????????????????????????????????????????????????????????????????????????????Status at last note:Not met, 75% ??compliant.   ??????????????????????????????????????????????Current: progressing pt noting doing some ex with HEP     PLAN    Upgrade activities as tolerated       Continue plan of care    Update interventions per flow sheet         Discharge due to:_    Other:_       Arbutus Leas, PT 08/04/2016  2:21 PM    Future Appointments  Date Time Provider Department  Center   08/06/2016 8:00 AM Carlyon Prows, PT [facility]

## 2016-08-06 ENCOUNTER — Inpatient Hospital Stay: Admit: 2016-08-06 | Discharge: 2016-08-06 | Payer: TRICARE (CHAMPUS) | Primary: Family Medicine

## 2016-08-06 NOTE — Progress Notes (Signed)
PT DAILY TREATMENT NOTE 12-16    Patient Name: Molly [name]  Date:08/06/2016  DOB: [birth year]    Patient DOB Verified  Payor: TRICARE / Plan: [facility] [employer] RETIREES AND DEPENDENTS / Product Type: T[employer]are /    In time:808  Out time:850  Total Treatment Time (min): 42  Visit #: 13 of 16    Treatment Area: Impingement syndrome of left shoulder [M75.42]    SUBJECTIVE  Pain Level (0-10 scale): 2/10  Any medication changes, allergies to medications, adverse drug reactions, diagnosis change, or new procedure performed?:  No     Yes (see summary sheet for update)  Subjective functional status/changes:    No changes reported  Hurting a little this morning.    OBJECTIVE    Modality rationale: decrease pain and increase tissue extensibility to improve the patient???s ability to increase activity/position tolerance   Min Type Additional Details     Estim:  Unatt       IFC  Premod                        Other:  w/ice   w/heat  Position:  Location:     Estim: Att    TENS instruct  NMES                    Other:  w/US   w/ice   w/heat  Position:  Location:      Traction:  Cervical       Lumbar                        Prone          Supine                       Intermittent   Continuous Lbs:   before manual   after manual      Ultrasound: Continuous    Pulsed                           1MHz   3MHz W/cm2:  Location:      Iontophoresis with dexamethasone         Location:  Take home patch    In clinic   10   Ice       heat    Ice massage    Laser     Anodyne Position: H/L supine  Location:left shoulder      Laser with stim    Other:  Position:  Location:      Vasopneumatic Device Pressure:        lo  med  hi   Temperature:  lo  med  hi    Skin assessment post-treatment:  intact redness- no adverse reaction    redness ??? adverse reaction:      min Eval                  Re-Eval       32 min Therapeutic Exercise:   See flow sheet :   Rationale: increase ROM and increase strength to improve the patient???s  ability to utilize left UE with ADL's     min Therapeutic Activity:    See flow sheet :         min Neuromuscular Re-education:    See flow sheet :        min Manual  Therapy:          min Gait Training:  ___ feet with ___ device on level surfaces with ___ level of assist   Rationale:          With    TE    TA    neuro    other: Patient Education:  Review HEP     Progressed/Changed HEP based on:    positioning    body mechanics    transfers    heat/ice application     other:      Other Objective/Functional Measures: none taken today   Pt arrived 8 min late.    Pain Level (0-10 scale) post treatment: 0/10    ASSESSMENT/Changes in Function: No new progress.    Patient will continue to benefit from skilled PT services to modify and progress therapeutic interventions, address ROM deficits, address strength deficits, analyze and address soft tissue restrictions, analyze and cue movement patterns and assess and modify postural abnormalities to attain remaining goals.       See Plan of Care    See progress note/recertification    See Discharge Summary         Progress towards goals / Updated goals:  Short Term Goals:??To be accomplished in 2??weeks  ??????????????????????????????????????????????2) Pt will have increased left shoulder elevation to >??or = to 165 degrees to improve dressing and grooming tasks.   ??????????????????????????????????????????????@ Eval: left shoulder AROM: flexion 125 degrees ??Scaption: 130 degrees??  ????????????????????????????????????????????????Current: Fe 130 deg in standing , scaption 105  ????  ??????????????????????????????????????????????3) Pt will have increased left shoulder IR/ER to >??75 degrees to improved grooming and dressing tasks.   ??????????????????????????????????????????????@ Eval: IR:??T10/ right ??????L2/left ??????????ER:??C7/right ??Occiput/left  ????????????????????????????????????????????????????????????????????????????????????????Status at last note:Not met, IR: 88????ER: 72 degrees   ??????????????????????????????????????????????Current:ER 72 deg   ????  ??????????????????????????????????????????????4) Pt will be issued a HEP and be I and compliant  to promote carryover b/w therapy sessions.   ??????????????????????????????????????????????@ Eval: HEP issued with cues to stay in pain tolerated ranges.   ????????????????????????????????????????????????????????????????????????????????????????Status at last note:not met, 75% compliant   ??????????????????????????????????????????????Current: progressing pt reporting compliance with HEP   ????  Long Term Goals:??To be accomplished in 4??weeks:  ??????????????????????????????????????????????1) Pt will have decreased left shoulder pain to >??or = to 2/10 pain at worst to improved ADL tolerance.   ??????????????????????????????????????????????@ Eval: 10/10 at worst.   ????????????????????????????????????????????????????????????????????????????????????????Status at last note:not met, 0-4/10 ??More stiffness than pain  ??????????????????????????????????????????????Current:not met: ??continued more stiffness than pain , but noted able to reach up to cupboard 2nd shelf no pain more fluid motion   ????  ??????????????????????????????????????????????2) Pt will have increased left shoulder elevation to >??or = to 180 degrees to improve dressing and grooming tasks.   ??????????????????????????????????????????????@ Eval: left shoulder AROM: flexion 125 degrees ??Scaption: 130 degrees  ????????????????????????????????????????????????????????????????????????????????????????Status at last note:not met, 161??deg ??in supine  ??????????????????????????????????????????????Current: FE 130 deg , abd 105 deg   ????  ??????????????????????????????????????????????3) Pt will have increased left shoulder IR/ER to >??90 degrees to improved grooming and dressing tasks.   ??????????????????????????????????????????????@ Eval: IR: T10/ right ??????L2/left ??????????ER:??C7/right ??Occiput/left  ????????????????????????????????????????????????????????????????????????????????????????Status at last note:Not met, IR: 88????ER: 72 degrees  ??????????????????????????????????????????????Current: 72 ER   ????  ??????????????????????????????????????????????4) Pt will be issued a HEP and be I and compliant to promote carryover b/w therapy sessions.   ??????????????????????????????????????????????@ Eval: HEP issued with cues to stay in pain tolerated ranges.   ????????????????????????????????????????????????????????????????????????????????????????Status at last note:Not met, 75% ??compliant.   ??????????????????????????????????????????????Current: progressing pt noting doing some ex with HEP   ??    PLAN  Upgrade activities as tolerated       Continue plan of care    Update interventions per flow sheet         Discharge due to:_    Other:_      Burnice Vassel, PT 08/06/2016  8:08 AM    No future appointments.

## 2016-08-13 ENCOUNTER — Inpatient Hospital Stay
Admit: 2016-08-13 | Payer: TRICARE (CHAMPUS) | Attending: Rehabilitative and Restorative Service Providers" | Primary: Family Medicine

## 2016-08-13 NOTE — Progress Notes (Signed)
PT DAILY TREATMENT NOTE 12-16    Patient Name: Molly [name]  Date:08/13/2016  DOB: 05-21-1956    Patient DOB Verified  Payor: TRICARE / Plan: [facility] [employer] RETIREES AND DEPENDENTS / Product Type: T[employer]are /    In time: 10:30   Out time: 11:30  Total Treatment Time (min): 60  Visit #: 14 of 16    Treatment Area: Impingement syndrome of left shoulder [M75.42]    SUBJECTIVE  Pain Level (0-10 scale): 3  Any medication changes, allergies to medications, adverse drug reactions, diagnosis change, or new procedure performed?:  No     Yes (see summary sheet for update)  Subjective functional status/changes:    No changes reported  I have some pain and I thought it was from the exercise that I did with a 3# wt. I did clean the house and I am more sore. I think my neck is the cause of some of my pain and my MD plans to send me back for that after I get an MRI.     OBJECTIVE    Modality rationale: Declined MHP   Min Type Additional Details     Estim:  Unatt       IFC  Premod                        Other:  w/ice   w/heat  Position:  Location:     Estim: Att    TENS instruct  NMES                    Other:  w/US   w/ice   w/heat  Position:  Location:      Traction:  Cervical       Lumbar                        Prone          Supine                       Intermittent   Continuous Lbs:   before manual   after manual      Ultrasound: Continuous    Pulsed                           1MHz   3MHz W/cm2:  Location:      Iontophoresis with dexamethasone         Location:  Take home patch    In clinic      Ice       heat    Ice massage    Laser     Anodyne Position:  Location:      Laser with stim    Other:  Position:  Location:      Vasopneumatic Device Pressure:        lo  med  hi   Temperature:  lo  med  hi    Skin assessment post-treatment:  intact redness- no adverse reaction    redness ??? adverse reaction:        45 min Therapeutic Exercise:   See flow sheet :    Rationale: increase ROM, increase strength, improve coordination, improve balance and increase proprioception to improve the patient???s ability to improve strength and stability for ADL.       10 min Manual Therapy:  STM with biofreeze, distraction, GH III/IV mobs, MFR  to UT and biceps. Oscillations. HR   Rationale: decrease pain, increase ROM, increase tissue extensibility, decrease edema , correct positional vertigo, decrease trigger points and increase postural awareness to improve mobility         With    TE    TA    neuro    other: Patient Education:  Review HEP     Progressed/Changed HEP based on:    positioning    body mechanics    transfers    heat/ice application     other:      Other Objective/Functional Measures: pain with ER and HR      Pain Level (0-10 scale) post treatment: 0    ASSESSMENT/Changes in Function: pt tolerated there-ex well with reduction of wt to 2# for S/L ER.     Patient will continue to benefit from skilled PT services to modify and progress therapeutic interventions, address functional mobility deficits, address ROM deficits, address strength deficits, analyze and address soft tissue restrictions, analyze and cue movement patterns, analyze and modify body mechanics/ergonomics and assess and modify postural abnormalities to attain remaining goals.       See Plan of Care    See progress note/recertification    See Discharge Summary         Progress towards goals / Updated goals:  Short Term Goals:??To be accomplished in 2??weeks  ??????????????????????????????????????????????2) Pt will have increased left shoulder elevation to >??or = to 165 degrees to improve dressing and grooming tasks.   ??????????????????????????????????????????????@ Eval: left shoulder AROM: flexion 125 degrees ??Scaption: 130 degrees??  ????????????????????????????????????????????????Current: Fe 130 deg in standing , scaption 105  ????  ??????????????????????????????????????????????3) Pt will have increased left shoulder IR/ER to >??75 degrees to improved grooming and dressing tasks.    ??????????????????????????????????????????????@ Eval: IR:??T10/ right ??????L2/left ??????????ER:??C7/right ??Occiput/left  ??????????????????????????????????????????????Status at last note:Not met, IR: 88????ER: 72 degrees   ??????????????????????????????????????????????Current:ER 72 deg   ????  ??????????????????????????????????????????????4) Pt will be issued a HEP and be I and compliant to promote carryover b/w therapy sessions.   ??????????????????????????????????????????????@ Eval: HEP issued with cues to stay in pain tolerated ranges.   ??????????????????????????????????????????????Status at last note:not met, 75% compliant   ??????????????????????????????????????????????Current: progressing pt reporting compliance with HEP   ????  Long Term Goals:??To be accomplished in 4??weeks:  ??????????????????????????????????????????????1) Pt will have decreased left shoulder pain to >??or = to 2/10 pain at worst to improved ADL tolerance.   ??????????????????????????????????????????????@ Eval: 10/10 at worst.   ?????????????????????????????????????????? Status at last note:not met, 0-4/10 ??More stiffness than pain  ??????????????????????????????????????????????Current:not met: ??continued more stiffness than pain , but noted able to reach up to cupboard 2nd shelf no pain more fluid motion   ????  ??????????????????????????????????????????????2) Pt will have increased left shoulder elevation to >??or = to 180 degrees to improve dressing and grooming tasks.   ??????????????????????????????????????????????@ Eval: left shoulder AROM: flexion 125 degrees ??Scaption: 130 degrees  ??????????????????????????????????????????????Status at last note:not met, 161??deg ??in supine  ??????????????????????????????????????????????Current: FE 130 deg , abd 105 deg   ????  ??????????????????????????????????????????????3) Pt will have increased left shoulder IR/ER to >??90 degrees to improved grooming and dressing tasks.   ??????????????????????????????????????????????@ Eval: IR: T10/ right ??????L2/left ??????????ER:??C7/right ??Occiput/left  ??????????????????????????????????????????????Status at last note:Not met, IR: 88????ER: 72 degrees  ??????????????????????????????????????????????Current: 72 ER   ????  ??????????????????????????????????????????????4) Pt will be issued a HEP and be I and compliant to promote carryover b/w therapy sessions.   ??????????????????????????????????????????????@ Eval: HEP issued with cues to stay in pain  tolerated ranges.   ??????????????????????????????????????????????Status at last note:Not met, 75% ??  compliant.   ??????????????????????????????????????????????Current: progressing pt noting doing some ex with HEP   ??  PLAN    Upgrade activities as tolerated       Continue plan of care    Update interventions per flow sheet         Discharge due to:_    Other:_  Progress towards D/C     Arbutus Leas, PT 08/13/2016  12:06 PM    Future Appointments  Date Time Provider [place]   08/15/2016 9:30 AM Arbutus Leas, PT [facility]   08/20/2016 1:30 PM Arbutus Leas, [facility]

## 2016-08-15 ENCOUNTER — Inpatient Hospital Stay: Admit: 2016-08-15 | Discharge: 2016-08-15 | Payer: TRICARE (CHAMPUS) | Primary: Family Medicine

## 2016-08-15 NOTE — Progress Notes (Signed)
PT DAILY TREATMENT NOTE 12-16    Patient Name: Molly [name]  Date:08/15/2016  DOB: 1956-03-29    Patient DOB Verified  Payor: TRICARE / Plan: [facility] TRICARE RETIREES AND DEPENDENTS / Product Type: Tricare /    In time:1000  Out time:1045  Total Treatment Time (min): 45  Visit #: 15 of 16    Treatment Area: Impingement syndrome of left shoulder [M75.42]    SUBJECTIVE  Pain Level (0-10 scale): 1/10  Any medication changes, allergies to medications, adverse drug reactions, diagnosis change, or new procedure performed?:  No     Yes (see summary sheet for update)  Subjective functional status/changes:    No changes reported      OBJECTIVE    Modality rationale: decrease pain and increase tissue extensibility to improve the patient???s ability to increase activity/position tolerance   Min Type Additional Details     Estim:  Unatt       IFC  Premod                        Other:  w/ice   w/heat  Position:  Location:     Estim: Att    TENS instruct  NMES                    Other:  w/US   w/ice   w/heat  Position:  Location:      Traction:  Cervical       Lumbar                        Prone          Supine                       Intermittent   Continuous Lbs:   before manual   after manual      Ultrasound: Continuous    Pulsed                           1MHz   3MHz W/cm2:  Location:      Iontophoresis with dexamethasone         Location:  Take home patch    In clinic   10   Ice       heat    Ice massage    Laser     Anodyne Position: H/L supine  Location:left shoulder      Laser with stim    Other:  Position:  Location:      Vasopneumatic Device Pressure:        lo  med  hi   Temperature:  lo  med  hi    Skin assessment post-treatment:  intact redness- no adverse reaction    redness ??? adverse reaction:      min Eval                  Re-Eval       35 min Therapeutic Exercise:   See flow sheet :   Rationale: increase ROM and increase strength to improve the patient???s ability to utilize left UE with ADL's      min Therapeutic Activity:    See flow sheet :         min Neuromuscular Re-education:    See flow sheet :        min Manual Therapy:  min Gait Training:  ___ feet with ___ device on level surfaces with ___ level of assist   Rationale:          With    TE    TA    neuro    other: Patient Education:  Review HEP     Progressed/Changed HEP based on:    positioning    body mechanics    transfers    heat/ice application     other:      Other Objective/Functional Measures:   FOTO score: 68/100     Pain Level (0-10 scale) post treatment: 0/10    ASSESSMENT/Changes in Function: FOTO score continues to improve.    Patient will continue to benefit from skilled PT services to modify and progress therapeutic interventions to attain remaining goals.       See Plan of Care    See progress note/recertification    See Discharge Summary         Progress towards goals / Updated goals:  Short Term Goals:??To be accomplished in 2??weeks  ??????????????????????????????????????????????2) Pt will have increased left shoulder elevation to >??or = to 165 degrees to improve dressing and grooming tasks.   ??????????????????????????????????????????????@ Eval: left shoulder AROM: flexion 125 degrees ??Scaption: 130 degrees??  ????????????????????????????????????????????????Current: Fe 130 deg in standing , scaption 105  ????  ??????????????????????????????????????????????3) Pt will have increased left shoulder IR/ER to >??75 degrees to improved grooming and dressing tasks.   ??????????????????????????????????????????????@ Eval: IR:??T10/ right ??????L2/left ??????????ER:??C7/right ??Occiput/left  ??????????????????????????????????????????????Status at last note:Not met, IR: 88????ER: 72 degrees   ??????????????????????????????????????????????Current:ER 72 deg   ????  ??????????????????????????????????????????????4) Pt will be issued a HEP and be I and compliant to promote carryover b/w therapy sessions.   ??????????????????????????????????????????????@ Eval: HEP issued with cues to stay in pain tolerated ranges.   ??????????????????????????????????????????????Status at last note:not met, 75% compliant   ??????????????????????????????????????????????Current: progressing pt reporting compliance with HEP   ????   Long Term Goals:??To be accomplished in 4??weeks:  ??????????????????????????????????????????????1) Pt will have decreased left shoulder pain to >??or = to 2/10 pain at worst to improved ADL tolerance.   ??????????????????????????????????????????????@ Eval: 10/10 at worst.   ?????????????????????????????????????????? Status at last note:not met, 0-4/10 ??More stiffness than pain  ??????????????????????????????????????????????Current:not met: ??continued more stiffness than pain , but noted able to reach up to cupboard 2nd shelf no pain more fluid motion   ????  ??????????????????????????????????????????????2) Pt will have increased left shoulder elevation to >??or = to 180 degrees to improve dressing and grooming tasks.   ??????????????????????????????????????????????@ Eval: left shoulder AROM: flexion 125 degrees ??Scaption: 130 degrees  ??????????????????????????????????????????????Status at last note:not met, 161??deg ??in supine  ??????????????????????????????????????????????Current: FE 130 deg , abd 105 deg   ????  ??????????????????????????????????????????????3) Pt will have increased left shoulder IR/ER to >??90 degrees to improved grooming and dressing tasks.   ??????????????????????????????????????????????@ Eval: IR: T10/ right ??????L2/left ??????????ER:??C7/right ??Occiput/left  ??????????????????????????????????????????????Status at last note:Not met, IR: 88????ER: 72 degrees  ??????????????????????????????????????????????Current: 72 ER   ????  ??????????????????????????????????????????????4) Pt will be issued a HEP and be I and compliant to promote carryover b/w therapy sessions.   ??????????????????????????????????????????????@ Eval: HEP issued with cues to stay in pain tolerated ranges.   ??????????????????????????????????????????????Status at last note:Not met, 75% ??compliant.   ??????????????????????????????????????????????Current: progressing pt noting doing some ex with HEP   ??     PLAN    Upgrade activities as tolerated       Continue plan of care    Update interventions per flow sheet         Discharge  due to:_    Other: D/C next visit to [place], PT 08/15/2016  10:03 AM    Future Appointments  Date Time Provider [place]   08/20/2016 1:30 PM Arbutus Leas, [facility]

## 2016-08-20 ENCOUNTER — Encounter: Payer: TRICARE (CHAMPUS) | Attending: Rehabilitative and Restorative Service Providers" | Primary: Family Medicine

## 2016-08-21 ENCOUNTER — Inpatient Hospital Stay: Admit: 2016-08-21 | Discharge: 2016-08-21 | Payer: TRICARE (CHAMPUS) | Primary: Family Medicine

## 2016-08-21 NOTE — Progress Notes (Addendum)
PT DAILY TREATMENT NOTE 12-16    Patient Name: Molly [name]  Date:08/21/2016  DOB: 05-12-1956    Patient DOB Verified  Payor: TRICARE / Plan: [facility] TRICARE RETIREES AND DEPENDENTS / Product Type: Tricare /    In time:1200  Out time:1240  Total Treatment Time (min): 40  Visit #: 16 of 16    Treatment Area: Impingement syndrome of left shoulder [M75.42]    SUBJECTIVE  Pain Level (0-10 scale): 1/10 Recent max: 2/10  Any medication changes, allergies to medications, adverse drug reactions, diagnosis change, or new procedure performed?:  No     Yes (see summary sheet for update)  Subjective functional status/changes:    No changes reported      OBJECTIVE    Modality rationale:    Min Type Additional Details     Estim:  Unatt       IFC  Premod                        Other:  w/ice   w/heat  Position:  Location:     Estim: Att    TENS instruct  NMES                    Other:  w/US   w/ice   w/heat  Position:  Location:      Traction:  Cervical       Lumbar                        Prone          Supine                       Intermittent   Continuous Lbs:   before manual   after manual      Ultrasound: Continuous    Pulsed                           1MHz   3MHz W/cm2:  Location:      Iontophoresis with dexamethasone         Location:  Take home patch    In clinic      Ice       heat    Ice massage    Laser     Anodyne Position:  Location:      Laser with stim    Other:  Position:  Location:      Vasopneumatic Device Pressure:        lo  med  hi   Temperature:  lo  med  hi    Skin assessment post-treatment:  intact redness- no adverse reaction    redness ??? adverse reaction:      min Eval                  Re-Eval       40 min Therapeutic Exercise:   See flow sheet : to include HEP udpate   Rationale: increase ROM and increase strength to improve the patient???s ability to utilize left UE with ADL's     min Therapeutic Activity:    See flow sheet :         min Neuromuscular Re-education:    See flow sheet :         min Manual Therapy:          min Gait Training:  ___ feet with ___ device on level surfaces with ___ level of assist   Rationale:          With    TE    TA    neuro    other: Patient Education:  Review HEP     Progressed/Changed HEP based on:    positioning    body mechanics    transfers    heat/ice application     other:      Other Objective/Functional Measures: see goal review     Pain Level (0-10 scale) post treatment: 0/10    ASSESSMENT/Changes in Function: Left shoulder AROM has improved from [facility] to be [facility], but not normal limits.  Pain intensity has improved. Pt to continue on own with self-monitored exercise at this time.      See Plan of Care    See progress note/recertification    See Discharge Summary         Progress towards goals / Updated goals:  Short Term Goals:??To be accomplished in 2??weeks  ??????????????????????????????????????????????2) Pt will have increased left shoulder elevation to >??or = to 165 degrees to improve dressing and grooming tasks.   ??????????????????????????????????????????????@ Eval: left shoulder AROM: flexion 125 degrees ??Scaption: 130 degrees??  ????????????????????????????????????????????????Current: progressing: flex/scap: 145  ????  ??????????????????????????????????????????????3) Pt will have increased left shoulder IR/ER to >??75 degrees to improved grooming and dressing tasks.   ??????????????????????????????????????????????@ Eval: IR:??T10/ right ??????L2/left ??????????ER:??C7/right ??Occiput/left  ??????????????????????????????????????????????Status at last note:Not met, IR: 88????ER: 72 degrees   ??????????????????????????????????????????????Current: ER: 80 ('@90'$ ), IR: T10 level  ????  ??????????????????????????????????????????????4) Pt will be issued a HEP and be I and compliant to promote carryover b/w therapy sessions.   ??????????????????????????????????????????????@ Eval: HEP issued with cues to stay in pain tolerated ranges.   ??????????????????????????????????????????????Status at last note:not met, 75% compliant   ??????????????????????????????????????????????Current: progressing pt reporting compliance with HEP   ????  Long Term Goals:??To be accomplished in 4??weeks:   ??????????????????????????????????????????????1) Pt will have decreased left shoulder pain to >??or = to 2/10 pain at worst to improved ADL tolerance.   ??????????????????????????????????????????????@ Eval: 10/10 at worst.   ????????????????????????????????????????????Status at last note:not met, 0-4/10 ??More stiffness than pain  ??????????????????????????????????????????????Current:??MET: 2/10 at worst  ????  ??????????????????????????????????????????????2) Pt will have increased left shoulder elevation to >??or = to 180 degrees to improve dressing and grooming tasks.   ??????????????????????????????????????????????@ Eval: left shoulder AROM: flexion 125 degrees ??Scaption: 130 degrees  ??????????????????????????????????????????????Status at last note:not met, 161??deg ??in supine  ??????????????????????????????????????????????Current:  Progressing:  flex/scap: 145  ????  ??????????????????????????????????????????????3) Pt will have increased left shoulder IR/ER to >??90 degrees to improved grooming and dressing tasks.   ??????????????????????????????????????????????@ Eval: IR: T10/ right ??????L2/left ??????????ER:??C7/right ??Occiput/left  ??????????????????????????????????????????????Status at last note:Not met, IR: 88????ER: 72 degrees  ??????????????????????????????????????????????Current:  ER: 80 ('@90'$ ), IR: T10 level  ????  ??????????????????????????????????????????????4) Pt will be issued a HEP and be I and compliant to promote carryover b/w therapy sessions.   ??????????????????????????????????????????????@ Eval: HEP issued with cues to stay in pain tolerated ranges.   ??????????????????????????????????????????????Status at last note:Not met, 75% ??compliant.   ??????????????????????????????????????????????Current: progressing pt noting doing some ex with HEP   ??     PLAN    Upgrade activities as tolerated       Continue plan of care    Update interventions per flow sheet         Discharge due to: completion of program with progression towards/meeting of goals    Other:_      Carlyon Prows, PT 08/21/2016  12:07 PM  No future appointments.

## 2016-08-21 NOTE — Progress Notes (Signed)
[facility]  [address] [place] [address]  Ph [phone]  Fx [phone]    Physical Therapy Discharge Summary    Patient name: Molly [name]     Start of Care: 06/11/16  Referral source: Marinell Blight, [place]    DOB: Feb 14, 1956  Medical/Treatment Diagnosis: Impingement syndrome of left shoulder [M75.42]  Onset Date:April 2016  Prior Hospitalization: see medical history   Provider#: [id number]  Comorbidities: DM, Thyroid, HTN, Chronic hip and back pain   Prior Level of Function:Pt amb intermittently with SPC, I with ADL's and IADL's.   Medications: Verified on Patient Summary List    Visits from Start of Care: 16    Missed Visits: 1  Reporting Period : 07/25/16 to 08/21/16    Summary of Care: Treatment has included ROM/stretching, strengthening/stability, manual techniques and heat application. Left shoulder AROM has improved from [facility] to be [facility], but not normal limits.  Pain intensity has significantly improved from [facility]. Pt to continue on own with self-monitored exercise at this time.    Short Term Goals:??To be accomplished in 2??weeks  ??????????????????????????????????????????????2) Pt will have increased left shoulder elevation to >??or = to 165 degrees to improve dressing and grooming tasks.   ??????????????????????????????????????????????@ Eval: left shoulder AROM: flexion 125 degrees ??Scaption: 130 degrees??  ????????????????????????????????????????????????Current: progressing: flex/scap: 145  ????  ??????????????????????????????????????????????3) Pt will have increased left shoulder IR/ER to >??75 degrees to improved grooming and dressing tasks.   ??????????????????????????????????????????????@ Eval: IR:??T10/ right ??????L2/left ??????????ER:??C7/right ??Occiput/left  ??????????????????????????????????????????????Status at last note:Not met, IR: 88????ER: 72 degrees   ??????????????????????????????????????????????Current: ER: 80 ('@90'$ ), IR: T10 level  ????  ??????????????????????????????????????????????4) Pt will be issued a HEP and be I and compliant to promote carryover b/w therapy sessions.   ??????????????????????????????????????????????@ Eval: HEP issued with cues to stay in pain  tolerated ranges.   ??????????????????????????????????????????????Status at last note:not met, 75% compliant   ??????????????????????????????????????????????Current: progressing pt reporting compliance with HEP   ????  Long Term Goals:??To be accomplished in 4??weeks:  ??????????????????????????????????????????????1) Pt will have decreased left shoulder pain to >??or = to 2/10 pain at worst to improved ADL tolerance.   ??????????????????????????????????????????????@ Eval: 10/10 at worst.   ????????????????????????????????????????????Status at last note:not met, 0-4/10 ??More stiffness than pain  ??????????????????????????????????????????????Current:??MET: 2/10 at worst  ????  ??????????????????????????????????????????????2) Pt will have increased left shoulder elevation to >??or = to 180 degrees to improve dressing and grooming tasks.   ??????????????????????????????????????????????@ Eval: left shoulder AROM: flexion 125 degrees ??Scaption: 130 degrees  ??????????????????????????????????????????????Status at last note:not met, 161??deg ??in supine  ??????????????????????????????????????????????Current:  Progressing:  flex/scap: 145  ????  ??????????????????????????????????????????????3) Pt will have increased left shoulder IR/ER to >??90 degrees to improved grooming and dressing tasks.   ??????????????????????????????????????????????@ Eval: IR: T10/ right ??????L2/left ??????????ER:??C7/right ??Occiput/left  ??????????????????????????????????????????????Status at last note:Not met, IR: 88????ER: 72 degrees  ??????????????????????????????????????????????Current:  ER: 80 ('@90'$ ), IR: T10 level  ????  ??????????????????????????????????????????????4) Pt will be issued a HEP and be I and compliant to promote carryover b/w therapy sessions.   ??????????????????????????????????????????????@ Eval: HEP issued with cues to stay in pain tolerated ranges.   ??????????????????????????????????????????????Status at last note:Not met, 75% ??compliant.   ??????????????????????????????????????????????Current: progressing pt noting doing some ex with HEP   ????      ASSESSMENT/RECOMMENDATIONS:  Discontinue therapy: Patient has reached or is progressing toward set goals      Patient is non-compliant or has abdicated      Due to lack of appreciable progress towards set goals    Carlyon Prows, PT 08/21/2016 1:31  PM

## 2016-08-25 ENCOUNTER — Encounter: Payer: TRICARE (CHAMPUS) | Attending: Rehabilitative and Restorative Service Providers" | Primary: Family Medicine

## 2016-11-25 NOTE — Other (Signed)
Spoke with pt. Stated she will have to postpone her surgery. Her [facility]CP [facility] and will not have the results back in time. Instructed her to call Carter's office.

## 2016-11-26 ENCOUNTER — Inpatient Hospital Stay: Payer: TRICARE (CHAMPUS) | Primary: Family Medicine

## 2017-01-27 NOTE — Other (Addendum)
Denies any prostheticsPatient states family physician is aware of upcoming procedure/surgeryDenies family history of anesthesia complicationsDenies shortness of breath nor chest pain while climbing stairs  Patient to bring mouthpiece on dos  Clear liquids up to 6 hours prior to arrival time; no milk, milk products or solid food after midnight  Patient states labs done at [facility]; Left voicemail for Susan/Dr [facility]'s scheduler for results

## 2017-01-28 ENCOUNTER — Inpatient Hospital Stay: Payer: TRICARE (CHAMPUS) | Primary: Family Medicine

## 2017-02-04 DIAGNOSIS — M1611 Unilateral primary osteoarthritis, right hip: Secondary | ICD-10-CM

## 2017-02-04 HISTORY — DX: Unilateral primary osteoarthritis, right hip: M16.11

## 2017-02-04 NOTE — H&P (Signed)
[facility]  History and Physical Exam    Patient: Molly [name] MRN: [id number]  SSN: [id number]    Date of Birth: [birth year]  Age: 61 y.o.  Sex: female      Subjective:      Chief Complaint: right hip pain    History of Present Illness:  Patient complains of right hip pain and difficulty ambulating, which has progressed over the past several months.  X-rays showed osteoarthritis of the joint.  The patient's pain has persisted and progressed despite conservative treatments and therapies.   The patient has been previously treated with NSAIDs.  The patient has at this time opted for surgical intervention.       Past Medical History:   Diagnosis Date   ??? Adverse effect of anesthesia     hard to awaken   ??? Arthritis    ??? Asthma    ??? Bronchitis    ??? Constipation    ??? Diabetes (HCC) 2010   ??? Hyperlipidemia    ??? Hypertension 1991   ??? Osteoarthritis of right hip 02/04/2017   ??? Palpitations    ??? Pneumonia    ??? Sleep apnea     mouthpiece   ??? Thyroid disease     nodules   ??? Urinary urgency      Past Surgical History:   Procedure Laterality Date   ??? ABDOMEN SURGERY PROC UNLISTED     ??? HX CHOLECYSTECTOMY     ??? HX GI      abd laparoscopy   ??? HX HYSTERECTOMY     ??? HX ORTHOPAEDIC      ctr right     Social History     Occupational History   ??? Not on file.     Social History Main Topics   ??? Smoking status: Never Smoker   ??? Smokeless tobacco: Never Used   ??? Alcohol use No   ??? Drug use: No   ??? Sexual activity: Not on file     Prior to Admission medications    Medication Sig Start Date End Date Taking? Authorizing Provider   cyanocobalamin (VITAMIN B-12) 1,000 mcg/mL injection 1,000 mcg by IntraMUSCular route once.    Historical Provider   carvedilol (COREG) 12.5 mg tablet Take 12.5 mg by mouth. Indications: hypertension    Historical Provider   solifenacin (VESICARE) 10 mg tablet Take 10 mg by mouth. Indications: URINARY URGENCY    Historical Provider    VITAMIN D3 1,000 unit tablet daily. 12/18/16   Historical Provider   magnesium oxide (MAG-OX) 400 mg tablet daily. 12/19/16   Historical Provider   meloxicam (MOBIC) 15 mg tablet daily. 10/24/16   Historical Provider   VITAMIN B-6 50 mg tablet daily. 12/18/16   Historical Provider   telmisartan-hydroCHLOROthiazide (MICARDIS HCT) 80-25 mg per tablet daily. Indications: hypertension 10/29/16   Historical Provider   loratadine (CLARITIN) 10 mg tablet Take 10 mg by mouth daily as needed for Allergies. Indications: Allergic Rhinitis    Historical Provider   OTHER 84 mg. Indications: herbal potassium    Historical Provider   fluticasone-salmeterol (ADVAIR) 250-50 mcg/dose diskus inhaler Take 1 Puff by inhalation daily. Indications: MAINTENANCE THERAPY FOR ASTHMA    Phys Other, MD   NIFEdipine ER (ADALAT CC) 30 mg ER tablet Take 30 mg by mouth daily. Indications: hypertension    Phys Other, MD   metFORMIN (GLUCOPHAGE) 500 mg tablet Take 500 mg by mouth daily (with breakfast).    Phys Other,  MD       Allergies:   Allergies   Allergen Reactions   ??? Entex [Phenylephrine-Guaifenesin] Rash        Review of Systems:  Pertinent items are noted in the History of Present Illness.    Objective:       Physical Exam:  HEENT: Normocephalic, atraumatic  Lungs:  Clear to auscultation  Heart:   Regular rate and rhythm  Abdomen: Soft  Extremities:  Pain with range of motion of the right hip.  Passive flexion 90-100 degrees,                       passive internal rotation 0-10 degrees, with pain throughout ROM,                        passive external rotation 10-20 degrees with pain at the arc of motion.                       Antalgic gait noted.    Assessment:      Arthritis of the right hip.    Plan:       Proceed with scheduled RIGHT TOTAL HIP ARTHROPLASTY.    The various methods of treatment have been discussed with the patient and family. After consideration of risks, benefits, and other options for  treatment, the patient has consented to surgical interventions. Questions were answered and preoperative teaching was done by Dr Rosary Lively.     Signed By: Hermenia Bers, PA     February 04, 2017

## 2017-02-06 ENCOUNTER — Inpatient Hospital Stay: Admit: 2017-02-06 | Payer: TRICARE (CHAMPUS) | Primary: Family Medicine

## 2017-02-06 LAB — TYPE AND SCREEN
ABO/Rh: O POS
Antibody Screen: NEGATIVE

## 2017-02-06 LAB — METABOLIC PANEL, COMPREHENSIVE
A-G Ratio: 0.9 (ref 0.8–1.7)
ALT (SGPT): 23 U/L (ref 13–56)
AST (SGOT): 18 U/L (ref 15–37)
Albumin: 3.5 g/dL (ref 3.4–5.0)
Alk. phosphatase: 117 U/L (ref 45–117)
Anion gap: 3 mmol/L (ref 3.0–18)
BUN/Creatinine ratio: 22 — ABNORMAL HIGH (ref 12–20)
BUN: 20 MG/DL — ABNORMAL HIGH (ref 7.0–18)
Bilirubin, total: 0.4 MG/DL (ref 0.2–1.0)
CO2: 33 mmol/L — ABNORMAL HIGH (ref 21–32)
Calcium: 9.5 MG/DL (ref 8.5–10.1)
Chloride: 106 mmol/L (ref 100–108)
Creatinine: 0.91 MG/DL (ref 0.6–1.3)
GFR est AA: >60 mL/min/{1.73_m2} (ref >60)
GFR est non-AA: >60 mL/min/{1.73_m2} (ref >60)
Globulin: 3.9 g/dL (ref 2.0–4.0)
Glucose: 91 mg/dL (ref 74–99)
Potassium: 3.9 mmol/L (ref 3.5–5.5)
Protein, total: 7.4 g/dL (ref 6.4–8.2)
Sodium: 142 mmol/L (ref 136–145)

## 2017-02-06 LAB — CBC W/O DIFF
HCT: 37.4 % (ref 35.0–45.0)
HGB: 11.4 g/dL — ABNORMAL LOW (ref 12.0–16.0)
MCH: 24.5 PG (ref 24.0–34.0)
MCHC: 30.5 g/dL — ABNORMAL LOW (ref 31.0–37.0)
MCV: 80.4 FL (ref 74.0–97.0)
MPV: 11.6 FL (ref 9.2–11.8)
PLATELET: 205 10*3/uL (ref 135–420)
RBC: 4.65 M/uL (ref 4.20–5.30)
RDW: 16.2 % — ABNORMAL HIGH (ref 11.6–14.5)
WBC: 4.3 10*3/uL — ABNORMAL LOW (ref 4.6–13.2)

## 2017-02-06 LAB — TYPE & SCREEN
ABO/Rh(D): O POS
Antibody screen: NEGATIVE

## 2017-02-09 ENCOUNTER — Inpatient Hospital Stay: Admit: 2017-02-09 | Payer: TRICARE (CHAMPUS) | Primary: Family Medicine

## 2017-02-09 ENCOUNTER — Inpatient Hospital Stay
Admit: 2017-02-09 | Discharge: 2017-02-10 | Disposition: A | Discharge status: Skilled Nursing Facility | Payer: TRICARE (CHAMPUS) | Attending: Orthopaedic Surgery | Admitting: Orthopaedic Surgery

## 2017-02-09 DIAGNOSIS — M1611 Unilateral primary osteoarthritis, right hip: Secondary | ICD-10-CM

## 2017-02-09 HISTORY — PX: JOINT REPLACEMENT: SHX530

## 2017-02-09 LAB — GLUCOSE, POC
Glucose (POC): 126 mg/dL — ABNORMAL HIGH (ref 70–110)
Glucose (POC): 164 mg/dL — ABNORMAL HIGH (ref 70–110)
Glucose (POC): 139 mg/dL — ABNORMAL HIGH (ref 70–110)

## 2017-02-09 MED ORDER — DEXMEDETOMIDINE 100 MCG/ML IV SOLN
100 mcg/mL | INTRAVENOUS | Status: AC
Start: 2017-02-09 — End: ?

## 2017-02-09 MED ORDER — OXYCODONE-ACETAMINOPHEN 5 MG-325 MG TAB
5-325 mg | ORAL_TABLET | ORAL | 0 refills | Status: DC
Start: 2017-02-09 — End: 2017-05-12

## 2017-02-09 MED ORDER — .PHARMACY TO SUBSTITUTE PER PROTOCOL
Status: DC | PRN
Start: 2017-02-09 — End: 2017-02-09

## 2017-02-09 MED ORDER — FLU VACCINE QV 2017-18 (36 MOS+)(PF) 60 MCG (15 MCG X 4)/0.5 ML IM SYRINGE
60 mcg (15 mcg x 4)/0.5 mL | INTRAMUSCULAR | Status: DC
Start: 2017-02-09 — End: 2017-02-10

## 2017-02-09 MED ORDER — CEFADROXIL 500 MG CAP
500 mg | ORAL_CAPSULE | Freq: Two times a day (BID) | ORAL | 0 refills | Status: AC
Start: 2017-02-09 — End: 2017-02-14

## 2017-02-09 MED ORDER — NALOXONE 0.4 MG/ML INJECTION
0.4 mg/mL | INTRAMUSCULAR | Status: DC | PRN
Start: 2017-02-09 — End: 2017-02-10

## 2017-02-09 MED ORDER — MAGNESIUM OXIDE 400 MG TAB
400 mg | Freq: Every day | ORAL | Status: DC
Start: 2017-02-09 — End: 2017-02-10
  Administered 2017-02-09 – 2017-02-10 (×2): via ORAL

## 2017-02-09 MED ORDER — HYDROMORPHONE (PF) 2 MG/ML IJ SOLN
2 mg/mL | INTRAMUSCULAR | Status: DC | PRN
Start: 2017-02-09 — End: 2017-02-09
  Administered 2017-02-09: 12:00:00 via INTRAVENOUS

## 2017-02-09 MED ORDER — DIPHENHYDRAMINE HCL 50 MG/ML IJ SOLN
50 mg/mL | INTRAMUSCULAR | Status: DC | PRN
Start: 2017-02-09 — End: 2017-02-10

## 2017-02-09 MED ORDER — TELMISARTAN 80 MG TAB
80 mg | Freq: Every day | ORAL | Status: DC
Start: 2017-02-09 — End: 2017-02-10
  Administered 2017-02-09 – 2017-02-10 (×2): via ORAL

## 2017-02-09 MED ORDER — CHOLECALCIFEROL (VITAMIN D3) 1,000 UNIT (25 MCG) TAB
Freq: Every day | ORAL | Status: DC
Start: 2017-02-09 — End: 2017-02-10
  Administered 2017-02-09 – 2017-02-10 (×2): via ORAL

## 2017-02-09 MED ORDER — PROPOFOL 10 MG/ML IV EMUL
10 mg/mL | INTRAVENOUS | Status: AC
Start: 2017-02-09 — End: ?

## 2017-02-09 MED ORDER — ONDANSETRON (PF) 4 MG/2 ML INJECTION
4 mg/2 mL | INTRAMUSCULAR | Status: DC | PRN
Start: 2017-02-09 — End: 2017-02-09
  Administered 2017-02-09: 12:00:00 via INTRAVENOUS

## 2017-02-09 MED ORDER — LACTATED RINGERS IV
INTRAVENOUS | Status: DC
Start: 2017-02-09 — End: 2017-02-09
  Administered 2017-02-09: 13:00:00 via INTRAVENOUS

## 2017-02-09 MED ORDER — INSULIN LISPRO 100 UNIT/ML INJECTION
100 unit/mL | Freq: Once | SUBCUTANEOUS | Status: AC
Start: 2017-02-09 — End: 2017-02-09
  Administered 2017-02-09: 13:00:00 via SUBCUTANEOUS

## 2017-02-09 MED ORDER — SUCCINYLCHOLINE CHLORIDE 20 MG/ML INJECTION
20 mg/mL | INTRAMUSCULAR | Status: AC
Start: 2017-02-09 — End: ?

## 2017-02-09 MED ORDER — PANTOPRAZOLE 40 MG TAB, DELAYED RELEASE
40 mg | Freq: Every day | ORAL | Status: DC
Start: 2017-02-09 — End: 2017-02-09

## 2017-02-09 MED ORDER — KETOROLAC TROMETHAMINE 30 MG/ML INJECTION
30 mg/mL (1 mL) | INTRAMUSCULAR | Status: AC
Start: 2017-02-09 — End: ?

## 2017-02-09 MED ORDER — OXYCODONE 5 MG TAB
5 mg | ORAL | Status: DC | PRN
Start: 2017-02-09 — End: 2017-02-10
  Administered 2017-02-09 – 2017-02-10 (×3): via ORAL

## 2017-02-09 MED ORDER — TRANEXAMIC ACID 650 MG TABLET
650 mg | Freq: Once | ORAL | Status: DC
Start: 2017-02-09 — End: 2017-02-09

## 2017-02-09 MED ORDER — ROPIVACAINE (PF) 5 MG/ML (0.5 %) INJECTION
5 mg/mL (0. %) | INTRAMUSCULAR | Status: AC
Start: 2017-02-09 — End: ?

## 2017-02-09 MED ORDER — SOLIFENACIN 5 MG TAB
5 mg | Freq: Every day | ORAL | Status: DC
Start: 2017-02-09 — End: 2017-02-10
  Administered 2017-02-09 – 2017-02-10 (×2): via ORAL

## 2017-02-09 MED ORDER — CEFAZOLIN 1 GRAM SOLUTION FOR INJECTION
1 gram | INTRAMUSCULAR | Status: DC | PRN
Start: 2017-02-09 — End: 2017-02-09
  Administered 2017-02-09: 13:00:00

## 2017-02-09 MED ORDER — METFORMIN 500 MG TAB
500 mg | Freq: Every day | ORAL | Status: DC
Start: 2017-02-09 — End: 2017-02-10
  Administered 2017-02-09 – 2017-02-10 (×2): via ORAL

## 2017-02-09 MED ORDER — METOCLOPRAMIDE 5 MG/ML IJ SOLN
5 mg/mL | INTRAMUSCULAR | Status: DC | PRN
Start: 2017-02-09 — End: 2017-02-10

## 2017-02-09 MED ORDER — HYDROCHLOROTHIAZIDE 25 MG TAB
25 mg | Freq: Every day | ORAL | Status: DC
Start: 2017-02-09 — End: 2017-02-10
  Administered 2017-02-09: 16:00:00 via ORAL

## 2017-02-09 MED ORDER — ONDANSETRON (PF) 4 MG/2 ML INJECTION
4 mg/2 mL | INTRAMUSCULAR | Status: DC | PRN
Start: 2017-02-09 — End: 2017-02-10

## 2017-02-09 MED ORDER — SUCCINYLCHOLINE CHLORIDE 20 MG/ML INJECTION
20 mg/mL | INTRAMUSCULAR | Status: DC | PRN
Start: 2017-02-09 — End: 2017-02-09
  Administered 2017-02-09: 12:00:00 via INTRAVENOUS

## 2017-02-09 MED ORDER — PYRIDOXINE 50 MG TAB
50 mg | Freq: Every day | ORAL | Status: DC
Start: 2017-02-09 — End: 2017-02-10
  Administered 2017-02-09 – 2017-02-10 (×2): via ORAL

## 2017-02-09 MED ORDER — KETOROLAC TROMETHAMINE 30 MG/ML INJECTION
30 mg/mL (1 mL) | INTRAMUSCULAR | Status: DC | PRN
Start: 2017-02-09 — End: 2017-02-09
  Administered 2017-02-09: 12:00:00 via INTRAVENOUS

## 2017-02-09 MED ORDER — LORATADINE 10 MG TAB
10 mg | Freq: Every day | ORAL | Status: DC | PRN
Start: 2017-02-09 — End: 2017-02-10

## 2017-02-09 MED ORDER — CEFAZOLIN 1 GRAM SOLUTION FOR INJECTION
1 gram | INTRAMUSCULAR | Status: AC
Start: 2017-02-09 — End: ?

## 2017-02-09 MED ORDER — HYDROMORPHONE 2 MG/ML INJECTION SOLUTION
2 mg/mL | INTRAMUSCULAR | Status: AC
Start: 2017-02-09 — End: ?

## 2017-02-09 MED ORDER — MELOXICAM 7.5 MG TAB
7.5 mg | ORAL_TABLET | Freq: Two times a day (BID) | ORAL | 0 refills | Status: AC
Start: 2017-02-09 — End: 2017-02-23

## 2017-02-09 MED ORDER — HYDROMORPHONE 2 MG/ML INJECTION SOLUTION
2 mg/mL | INTRAMUSCULAR | Status: DC | PRN
Start: 2017-02-09 — End: 2017-02-09

## 2017-02-09 MED ORDER — DEXMEDETOMIDINE 400 MCG/100 ML (4 MCG/ML) IN 0.9 % SODIUM CHLORIDE IV
400 mcg/100 mL (4 mcg/mL) | INTRAVENOUS | Status: DC | PRN
Start: 2017-02-09 — End: 2017-02-09
  Administered 2017-02-09 (×2): via INTRAVENOUS

## 2017-02-09 MED ORDER — CEFAZOLIN 2 GM/50 ML IN DEXTROSE (ISO-OSMOTIC) IVPB
2 gram/50 mL | Freq: Three times a day (TID) | INTRAVENOUS | Status: AC
Start: 2017-02-09 — End: 2017-02-09
  Administered 2017-02-09 – 2017-02-10 (×2): via INTRAVENOUS

## 2017-02-09 MED ORDER — GLYCOPYRROLATE 0.2 MG/ML IJ SOLN
0.2 mg/mL | INTRAMUSCULAR | Status: DC | PRN
Start: 2017-02-09 — End: 2017-02-09
  Administered 2017-02-09: 11:00:00 via INTRAVENOUS

## 2017-02-09 MED ORDER — CYANOCOBALAMIN 1,000 MCG/ML IJ SOLN
1000 mcg/mL | Freq: Once | INTRAMUSCULAR | Status: AC
Start: 2017-02-09 — End: 2017-02-09
  Administered 2017-02-09: 18:00:00 via INTRAMUSCULAR

## 2017-02-09 MED ORDER — ONDANSETRON (PF) 4 MG/2 ML INJECTION
4 mg/2 mL | INTRAMUSCULAR | Status: AC
Start: 2017-02-09 — End: ?

## 2017-02-09 MED ORDER — DOCUSATE SODIUM 100 MG CAP
100 mg | Freq: Two times a day (BID) | ORAL | Status: DC
Start: 2017-02-09 — End: 2017-02-10
  Administered 2017-02-09 – 2017-02-10 (×3): via ORAL

## 2017-02-09 MED ORDER — LIDOCAINE (PF) 20 MG/ML (2 %) IJ SOLN
20 mg/mL (2 %) | INTRAMUSCULAR | Status: AC
Start: 2017-02-09 — End: ?

## 2017-02-09 MED ORDER — CELECOXIB 100 MG CAP
100 mg | ORAL | Status: AC
Start: 2017-02-09 — End: 2017-02-09
  Administered 2017-02-09: 11:00:00 via ORAL

## 2017-02-09 MED ORDER — PANTOPRAZOLE 40 MG TAB, DELAYED RELEASE
40 mg | Freq: Every day | ORAL | Status: DC
Start: 2017-02-09 — End: 2017-02-09
  Administered 2017-02-09: 11:00:00 via ORAL

## 2017-02-09 MED ORDER — LACTATED RINGERS IV
INTRAVENOUS | Status: DC
Start: 2017-02-09 — End: 2017-02-10
  Administered 2017-02-09 (×3): via INTRAVENOUS

## 2017-02-09 MED ORDER — DIPHENHYDRAMINE 25 MG CAP
25 mg | ORAL | Status: DC | PRN
Start: 2017-02-09 — End: 2017-02-10

## 2017-02-09 MED ORDER — FENTANYL CITRATE (PF) 50 MCG/ML IJ SOLN
50 mcg/mL | INTRAMUSCULAR | Status: DC | PRN
Start: 2017-02-09 — End: 2017-02-09
  Administered 2017-02-09: 12:00:00 via INTRAVENOUS

## 2017-02-09 MED ORDER — PROPOFOL 10 MG/ML IV EMUL
10 mg/mL | INTRAVENOUS | Status: DC | PRN
Start: 2017-02-09 — End: 2017-02-09
  Administered 2017-02-09 (×2): via INTRAVENOUS

## 2017-02-09 MED ORDER — ZOLPIDEM 5 MG TAB
5 mg | Freq: Every evening | ORAL | Status: DC | PRN
Start: 2017-02-09 — End: 2017-02-10

## 2017-02-09 MED ORDER — DEXAMETHASONE SODIUM PHOSPHATE 4 MG/ML IJ SOLN
4 mg/mL | Freq: Once | INTRAMUSCULAR | Status: AC
Start: 2017-02-09 — End: 2017-02-09
  Administered 2017-02-09: 11:00:00 via INTRAVENOUS

## 2017-02-09 MED ORDER — GLUCAGON 1 MG INJECTION
1 mg | INTRAMUSCULAR | Status: DC | PRN
Start: 2017-02-09 — End: 2017-02-09

## 2017-02-09 MED ORDER — SODIUM CHLORIDE 0.9 % IV
INTRAVENOUS | Status: AC
Start: 2017-02-09 — End: 2017-02-09
  Administered 2017-02-09: 15:00:00 via INTRAVENOUS

## 2017-02-09 MED ORDER — SODIUM CHLORIDE 0.9 % IJ SYRG
INTRAMUSCULAR | Status: DC | PRN
Start: 2017-02-09 — End: 2017-02-09

## 2017-02-09 MED ORDER — DEXTROSE 50% IN WATER (D50W) IV SYRG
INTRAVENOUS | Status: DC | PRN
Start: 2017-02-09 — End: 2017-02-09

## 2017-02-09 MED ORDER — LIDOCAINE (PF) 20 MG/ML (2 %) IJ SOLN
20 mg/mL (2 %) | INTRAMUSCULAR | Status: DC | PRN
Start: 2017-02-09 — End: 2017-02-09
  Administered 2017-02-09: 12:00:00 via INTRAVENOUS

## 2017-02-09 MED ORDER — KETOROLAC TROMETHAMINE 15 MG/ML INJECTION
15 mg/mL | Freq: Four times a day (QID) | INTRAMUSCULAR | Status: AC
Start: 2017-02-09 — End: 2017-02-10
  Administered 2017-02-09 – 2017-02-10 (×5): via INTRAVENOUS

## 2017-02-09 MED ORDER — ASPIRIN 325 MG TAB
325 mg | ORAL_TABLET | Freq: Two times a day (BID) | ORAL | 0 refills | Status: AC
Start: 2017-02-09 — End: 2017-03-02

## 2017-02-09 MED ORDER — CARVEDILOL 12.5 MG TAB
12.5 mg | Freq: Two times a day (BID) | ORAL | Status: DC
Start: 2017-02-09 — End: 2017-02-10
  Administered 2017-02-10: 17:00:00 via ORAL

## 2017-02-09 MED ORDER — ASPIRIN 81 MG TAB, DELAYED RELEASE
81 mg | Freq: Two times a day (BID) | ORAL | Status: DC
Start: 2017-02-09 — End: 2017-02-10
  Administered 2017-02-09 – 2017-02-10 (×2): via ORAL

## 2017-02-09 MED ORDER — MIDAZOLAM 1 MG/ML IJ SOLN
1 mg/mL | INTRAMUSCULAR | Status: DC | PRN
Start: 2017-02-09 — End: 2017-02-09
  Administered 2017-02-09: 11:00:00 via INTRAVENOUS

## 2017-02-09 MED ORDER — SODIUM CHLORIDE 0.9 % IJ SYRG
Freq: Three times a day (TID) | INTRAMUSCULAR | Status: DC
Start: 2017-02-09 — End: 2017-02-10

## 2017-02-09 MED ORDER — SODIUM CHLORIDE 0.9 % IJ SYRG
INTRAMUSCULAR | Status: DC | PRN
Start: 2017-02-09 — End: 2017-02-10

## 2017-02-09 MED ORDER — CEFAZOLIN 2 GM/50 ML IN DEXTROSE (ISO-OSMOTIC) IVPB
2 gram/50 mL | Freq: Once | INTRAVENOUS | Status: AC
Start: 2017-02-09 — End: 2017-02-09
  Administered 2017-02-09: 11:00:00 via INTRAVENOUS

## 2017-02-09 MED ORDER — SODIUM CHLORIDE 0.9 % IV PIGGY BACK
1000 mg/10 mL (100 mg/mL) | INTRAVENOUS | Status: AC
Start: 2017-02-09 — End: 2017-02-09
  Administered 2017-02-09 (×2): via INTRAVENOUS

## 2017-02-09 MED ORDER — PREGABALIN 25 MG CAP
25 mg | ORAL | Status: AC
Start: 2017-02-09 — End: 2017-02-09
  Administered 2017-02-09: 11:00:00 via ORAL

## 2017-02-09 MED ORDER — INSULIN LISPRO 100 UNIT/ML INJECTION
100 unit/mL | Freq: Once | SUBCUTANEOUS | Status: DC
Start: 2017-02-09 — End: 2017-02-09

## 2017-02-09 MED ORDER — TRANEXAMIC ACID 1,000 MG/10 ML (100 MG/ML) IV
1000 mg/10 mL (100 mg/mL) | INTRAVENOUS | Status: AC
Start: 2017-02-09 — End: ?

## 2017-02-09 MED ORDER — MIDAZOLAM 1 MG/ML IJ SOLN
1 mg/mL | INTRAMUSCULAR | Status: AC
Start: 2017-02-09 — End: ?

## 2017-02-09 MED ORDER — POLYMYXIN B SULFATE 500,000 UNIT IJ SOLR
500000 unit | INTRAMUSCULAR | Status: AC
Start: 2017-02-09 — End: ?

## 2017-02-09 MED ORDER — ACETAMINOPHEN 1,000 MG/100 ML (10 MG/ML) IV
1000 mg/100 mL (10 mg/mL) | Freq: Once | INTRAVENOUS | Status: AC
Start: 2017-02-09 — End: 2017-02-09
  Administered 2017-02-09: 11:00:00 via INTRAVENOUS

## 2017-02-09 MED ORDER — GLYCOPYRROLATE 0.2 MG/ML IJ SOLN
0.2 mg/mL | INTRAMUSCULAR | Status: AC
Start: 2017-02-09 — End: ?

## 2017-02-09 MED ORDER — ACETAMINOPHEN 325 MG TABLET
325 mg | Freq: Four times a day (QID) | ORAL | Status: DC
Start: 2017-02-09 — End: 2017-02-10
  Administered 2017-02-09 – 2017-02-10 (×5): via ORAL

## 2017-02-09 MED ORDER — FENTANYL CITRATE (PF) 50 MCG/ML IJ SOLN
50 mcg/mL | INTRAMUSCULAR | Status: AC
Start: 2017-02-09 — End: ?

## 2017-02-09 MED ORDER — BACITRACIN 50,000 UNIT IM
50000 unit | INTRAMUSCULAR | Status: AC
Start: 2017-02-09 — End: ?

## 2017-02-09 MED ORDER — MELOXICAM 7.5 MG TAB
7.5 mg | Freq: Two times a day (BID) | ORAL | Status: DC
Start: 2017-02-09 — End: 2017-02-10
  Administered 2017-02-10 (×2): via ORAL

## 2017-02-09 MED ORDER — SODIUM CHLORIDE 0.9 % IV
INTRAVENOUS | Status: AC
Start: 2017-02-09 — End: 2017-02-10
  Administered 2017-02-09 – 2017-02-10 (×2): via INTRAVENOUS

## 2017-02-09 MED ORDER — LACTATED RINGERS BOLUS IV
Freq: Once | INTRAVENOUS | Status: AC
Start: 2017-02-09 — End: 2017-02-09
  Administered 2017-02-09: 11:00:00 via INTRAVENOUS

## 2017-02-09 MED ORDER — FLUTICASONE 100 MCG-VILANTEROL 25 MCG/DOSE BREATH ACTIVATED INHALER
100-25 mcg/dose | Freq: Every day | RESPIRATORY_TRACT | Status: DC
Start: 2017-02-09 — End: 2017-02-10

## 2017-02-09 MED ORDER — NIFEDIPINE ER 30 MG 24 HR TAB
30 mg | Freq: Every day | ORAL | Status: DC
Start: 2017-02-09 — End: 2017-02-10

## 2017-02-09 MED ORDER — KETOROLAC TROMETHAMINE 30 MG/ML INJECTION
30 mg/mL (1 mL) | INTRAMUSCULAR | Status: DC | PRN
Start: 2017-02-09 — End: 2017-02-09
  Administered 2017-02-09: 13:00:00 via INTRAMUSCULAR

## 2017-02-09 MED ORDER — INSULIN LISPRO 100 UNIT/ML INJECTION
100 unit/mL | Freq: Four times a day (QID) | SUBCUTANEOUS | Status: DC
Start: 2017-02-09 — End: 2017-02-10
  Administered 2017-02-10 (×2): via SUBCUTANEOUS

## 2017-02-09 MED ORDER — FERROUS SULFATE 325 MG (65 MG ELEMENTAL IRON) TAB
325 mg (65 mg iron) | Freq: Three times a day (TID) | ORAL | Status: DC
Start: 2017-02-09 — End: 2017-02-10
  Administered 2017-02-09 – 2017-02-10 (×4): via ORAL

## 2017-02-09 MED FILL — VITAMIN B-6 50 MG TABLET: 50 mg | ORAL | Qty: 1

## 2017-02-09 MED FILL — GLYCOPYRROLATE 0.2 MG/ML IJ SOLN: 0.2 mg/mL | INTRAMUSCULAR | Qty: 2

## 2017-02-09 MED FILL — PANTOPRAZOLE 40 MG TAB, DELAYED RELEASE: 40 mg | ORAL | Qty: 1

## 2017-02-09 MED FILL — CHOLECALCIFEROL (VITAMIN D3) 1,000 UNIT (25 MCG) TAB: ORAL | Qty: 1

## 2017-02-09 MED FILL — INSULIN LISPRO 100 UNIT/ML INJECTION: 100 unit/mL | SUBCUTANEOUS | Qty: 1

## 2017-02-09 MED FILL — GLUCAGEN DIAGNOSTIC KIT 1 MG/ML INJECTION: 1 mg/mL | INTRAMUSCULAR | Qty: 1

## 2017-02-09 MED FILL — TELMISARTAN 80 MG TAB: 80 mg | ORAL | Qty: 1

## 2017-02-09 MED FILL — ROPIVACAINE (PF) 5 MG/ML (0.5 %) INJECTION: 5 mg/mL (0. %) | INTRAMUSCULAR | Qty: 30

## 2017-02-09 MED FILL — PROPOFOL 10 MG/ML IV EMUL: 10 mg/mL | INTRAVENOUS | Qty: 20

## 2017-02-09 MED FILL — MIDAZOLAM 1 MG/ML IJ SOLN: 1 mg/mL | INTRAMUSCULAR | Qty: 2

## 2017-02-09 MED FILL — CEFAZOLIN 2 GM/50 ML IN DEXTROSE (ISO-OSMOTIC) IVPB: 2 gram/50 mL | INTRAVENOUS | Qty: 50

## 2017-02-09 MED FILL — HYDROCHLOROTHIAZIDE 25 MG TAB: 25 mg | ORAL | Qty: 1

## 2017-02-09 MED FILL — ONDANSETRON (PF) 4 MG/2 ML INJECTION: 4 mg/2 mL | INTRAMUSCULAR | Qty: 2

## 2017-02-09 MED FILL — KETOROLAC TROMETHAMINE 30 MG/ML INJECTION: 30 mg/mL (1 mL) | INTRAMUSCULAR | Qty: 1

## 2017-02-09 MED FILL — BREO ELLIPTA 100 MCG-25 MCG/DOSE POWDER FOR INHALATION: 100-25 mcg/dose | RESPIRATORY_TRACT | Qty: 28

## 2017-02-09 MED FILL — PROPOFOL 10 MG/ML IV EMUL: 10 mg/mL | INTRAVENOUS | Qty: 40

## 2017-02-09 MED FILL — SODIUM CHLORIDE 0.9 % IV: INTRAVENOUS | Qty: 1000

## 2017-02-09 MED FILL — LYRICA 25 MG CAPSULE: 25 mg | ORAL | Qty: 1

## 2017-02-09 MED FILL — LACTATED RINGERS IV: INTRAVENOUS | Qty: 1000

## 2017-02-09 MED FILL — BD POSIFLUSH NORMAL SALINE 0.9 % INJECTION SYRINGE: INTRAMUSCULAR | Qty: 10

## 2017-02-09 MED FILL — KETOROLAC TROMETHAMINE 15 MG/ML INJECTION: 15 mg/mL | INTRAMUSCULAR | Qty: 1

## 2017-02-09 MED FILL — CYANOCOBALAMIN 1,000 MCG/ML IJ SOLN: 1000 mcg/mL | INTRAMUSCULAR | Qty: 1

## 2017-02-09 MED FILL — ACETAMINOPHEN 325 MG TABLET: 325 mg | ORAL | Qty: 2

## 2017-02-09 MED FILL — DEXAMETHASONE SODIUM PHOSPHATE 4 MG/ML IJ SOLN: 4 mg/mL | INTRAMUSCULAR | Qty: 1

## 2017-02-09 MED FILL — CELECOXIB 100 MG CAP: 100 mg | ORAL | Qty: 2

## 2017-02-09 MED FILL — POLYMYXIN B SULFATE 500,000 UNIT IJ SOLR: 500000 unit | INTRAMUSCULAR | Qty: 500000

## 2017-02-09 MED FILL — OXYCODONE 5 MG TAB: 5 mg | ORAL | Qty: 1

## 2017-02-09 MED FILL — FENTANYL CITRATE (PF) 50 MCG/ML IJ SOLN: 50 mcg/mL | INTRAMUSCULAR | Qty: 2

## 2017-02-09 MED FILL — BACITRACIN 50,000 UNIT IM: 50000 unit | INTRAMUSCULAR | Qty: 50000

## 2017-02-09 MED FILL — MAGNESIUM OXIDE 400 MG TAB: 400 mg | ORAL | Qty: 1

## 2017-02-09 MED FILL — CEFAZOLIN 1 GRAM SOLUTION FOR INJECTION: 1 gram | INTRAMUSCULAR | Qty: 1000

## 2017-02-09 MED FILL — FERROUS SULFATE 325 MG (65 MG ELEMENTAL IRON) TAB: 325 mg (65 mg iron) | ORAL | Qty: 1

## 2017-02-09 MED FILL — DOCUSATE SODIUM 100 MG CAP: 100 mg | ORAL | Qty: 1

## 2017-02-09 MED FILL — OFIRMEV 1,000 MG/100 ML (10 MG/ML) INTRAVENOUS SOLUTION: 1000 mg/100 mL (10 mg/mL) | INTRAVENOUS | Qty: 100

## 2017-02-09 MED FILL — TRANEXAMIC ACID 1,000 MG/10 ML (100 MG/ML) IV: 1000 mg/10 mL (100 mg/mL) | INTRAVENOUS | Qty: 10

## 2017-02-09 MED FILL — VESICARE 5 MG TABLET: 5 mg | ORAL | Qty: 2

## 2017-02-09 MED FILL — ASPIRIN 81 MG TAB, DELAYED RELEASE: 81 mg | ORAL | Qty: 1

## 2017-02-09 MED FILL — GLYCOPYRROLATE 0.2 MG/ML IJ SOLN: 0.2 mg/mL | INTRAMUSCULAR | Qty: 1

## 2017-02-09 MED FILL — LIDOCAINE (PF) 20 MG/ML (2 %) IJ SOLN: 20 mg/mL (2 %) | INTRAMUSCULAR | Qty: 10

## 2017-02-09 MED FILL — DEXMEDETOMIDINE 100 MCG/ML IV SOLN: 100 mcg/mL | INTRAVENOUS | Qty: 32

## 2017-02-09 MED FILL — HYDROMORPHONE 2 MG/ML INJECTION SOLUTION: 2 mg/mL | INTRAMUSCULAR | Qty: 1

## 2017-02-09 MED FILL — METFORMIN 500 MG TAB: 500 mg | ORAL | Qty: 1

## 2017-02-09 MED FILL — QUELICIN 20 MG/ML INJECTION SOLUTION: 20 mg/mL | INTRAMUSCULAR | Qty: 10

## 2017-02-09 MED FILL — QUELICIN 20 MG/ML INJECTION SOLUTION: 20 mg/mL | INTRAMUSCULAR | Qty: 5

## 2017-02-09 MED FILL — DEXMEDETOMIDINE 100 MCG/ML IV SOLN: 100 mcg/mL | INTRAVENOUS | Qty: 2

## 2017-02-09 MED FILL — TRANEXAMIC ACID 1,000 MG/10 ML (100 MG/ML) IV: 1000 mg/10 mL (100 mg/mL) | INTRAVENOUS | Qty: 20

## 2017-02-09 NOTE — Other (Addendum)
Anesthesia aware of blood pressure no new orders. Blood pressure is within normal limits.

## 2017-02-09 NOTE — Other (Signed)
TRANSFER - IN REPORT:    [facility](name) on Molly [name]  being received from PACU (unit) for routine post - op      Report consisted of patient???s Situation, Background, Assessment and   Recommendations(SBAR).     Information from the following report(s) SBAR, Kardex, MAR, Cardiac Rhythm NSR and Alarm Parameters  was reviewed with the receiving nurse.    Opportunity for questions and clarification was provided.      Assessment completed upon patient???s arrival to unit and care assumed.

## 2017-02-09 NOTE — Other (Signed)
Visit Vitals   ??? BP 109/64   ??? Pulse 67   ??? Temp 97.3 ??F (36.3 ??C)   ??? Resp 15   ??? Ht 5\' 2"  (1.575 m)   ??? Wt 96.9 kg (213 lb 9 oz)   ??? SpO2 94%   ??? BMI 39.06 kg/m2     Patient transferred to room 204 Jaili RN Skin assessment dual done seen Jaili assessment.

## 2017-02-09 NOTE — Progress Notes (Addendum)
[id number]:  Skin intact without pressure sore, assessed skin with Heathrer. N. RN in PACU.     [phone]: Assessment completed. Patient is A&OX4. Patient is calm and cooperative. Family at bedside. Patient PERRLA, oral mucosa pink and moist, strong grip on both upper extremities. Lung sound clear, not appear distress. Bowel sounds active. Pedal pulses are palpable, no complain of any numbness or tingling. IV site dry and dressing intact. Mepilex dressing to right hip is clean and dry, Ice is applied. SCD and Ted hose are applied. Pain is 8/10.  bed is in lower position, call bell and personal items are within reach. Pain regimen and activities are educated, educated pt to call when getting up to prevent fall. Pt verbalized understanding.     1300: Erica. T. RN reported that when assisted pt back to the bed, pt stated that she heard the hip " pop".     1310: Pt is currently lying in the bed resting, no facial grimace noted,  assessed pt with PT Sheralyn Boatmanoni at this time. Pt was able to wiggle the leg, move ankle, move her leg laterally without complained increased of pain, able to bend her knee, no shortness of the right leg noted, pt tolerated fine.     1441: Ambulated pt to the bathroom, pt drag the leg when walking, stated that " feel different, feel the muscle so tight." Reinsurance pt. Returned back to the back fine. HOB and SCD applied, assisted to reposition.      1515: Dr Montez Moritaarter at bedside, reassessed pt, reassurance pt that her hip is fine. Will continue to monitor her pain level.     1558: Pt stated that Pain is 5/10. PRN Oxycodone is given per MAR. Side effects is educated and will continue to monitor the effectiveness.

## 2017-02-09 NOTE — Op Note (Signed)
[facility]  Total Hip Arthroplasty      Patient: Molly LotShirley Yanko MRN: [id number]  SSN: [id number]    Date of Birth: 06/08/1956  Age: 61 y.o.  Sex: female      Date of Surgery: 02/09/2017   Preoperative Diagnosis: RIGHT HIP OSTEOARTHRITIS   Postoperative Diagnosis: RIGHT HIP OSTEOARTHRITIS   [facility]  Surgeon: Derryl HarborAnthony T Amatullah Christy, MD  Assistant: Reginal LutesJenna Crockett PA-C    Anesthesia: general    Procedure: Total Right Hip Arthroplasty    Findings: Degenerative joint disease of the right hip.     Estimated Blood Loss: 300ml    Specimens: None    Implants:   Implant Name Type Inv. Item Serial No. Manufacturer [name] No. LRB No. Used Action   CUP ACET SECTOR GRIPTION 50MM -- TI - [id number]  CUP ACET SECTOR GRIPTION 50MM -- TI  [employer] [id number] Right 1 Implanted   LINER ACET PINN NEUT 32X50 -- ALTRX - [id number]  LINER ACET PINN NEUT 32X50 -- ALTRX  [employer] [id number] Right 1 Implanted   4 STEM     [id number] Right 1 Implanted   HEAD FEM CER 32MM +5MM NK -- DELTA - [id number]   HEAD FEM CER 32MM +5MM NK -- DELTA   [employer] [id number] Right 1 Implanted       Procedure Detail:  After the patient was brought to the operating suite, She was effectively anesthetized using general anesthesia, then transferred to the Hana table and secured in a standard fashion. Her right hip was then prepped and draped in a normal sterile orthopedic fashion. She was given appropriate intravenous antibiotics preoperatively. After a proper timeout was performed, a direct anterior approach to the hip was performed using a short Smith-Petersen interval. Anterior capsulotomy was performed. The degenerative changes of the hip were noted. Femoral neck osteotomy was then performed to the templated area. The head and neck were removed. The pulvinar and labrum were excised. The acetabulum was then reamed up to 50  mm with good bleeding cancellous bone obtained. The cup was then irrigated with pulse lavage system. A 50 mm Gription cup was then impacted in place with excellent stable fixation obtained, placing the cup at about 45 degrees of abduction, 20 degrees of anteversion. The liner was then impacted in place. A screw was not placed.    Attention was turned to the femur, which was delivered into the wound with a combination of extension, external rotation, and adduction, and using the hook on the Hana table to deliver the femur into the wound. The canal was broached up to a size 4 for the Actis stem system with excellent stable fixation obtained. A trial reduction was then performed with the standard neck offset and 32 mm head balls with various neck lengths. With the +5, she appeared to have equalization of leg lengths and restoration of offset radiographically, and excellent functional stability was noted. The trial broach was removed. The canal was irrigated with the pulse lavage system. The final components were impacted in place with excellent stable fixation obtained once again. The final reduction was performed and once again leg lengths and offset were restored radiographically, using the C-arm radiographically intraoperatively, and excellent functional stability was noted. The wound was then irrigated one more time, and then closed in layers. The fascia of the tensor was closed with #1 Vicryl in a running type stitch. Subcutaneous tissue was closed with 2-0 Vicryl in a simple  buried stitch, and the skin was closed with Prineo. Dry, sterile dressing was then applied. She tolerated this well, was transferred to the bed, and taken to recovery room, extubated, in stable condition. All sponge and needle counts were correct.    Signed By: Derryl Harbor, MD     February 09, 2017

## 2017-02-09 NOTE — Progress Notes (Signed)
Problem: Falls - Risk of  Goal: *Absence of Falls  Document Schmid Fall Risk and appropriate interventions in the flowsheet.   Outcome: Progressing Towards Goal  Fall Risk Interventions:  Mobility Interventions: Patient to call before getting OOB, PT Consult for mobility concerns, PT Consult for assist device competence, Strengthening exercises (ROM-active/passive), Utilize walker, cane, or other assitive device         Medication Interventions: Assess postural VS orthostatic hypotension, Patient to call before getting OOB, Teach patient to arise slowly    Elimination Interventions: Call light in reach, Patient to call for help with toileting needs

## 2017-02-09 NOTE — Progress Notes (Signed)
Patient ambulated to bedside commode and is up in chair for lunch. Angelina PihJennifer A Greene, CNA

## 2017-02-09 NOTE — Progress Notes (Signed)
Patient ambulated to bathroom and is up in the chair for dinner. Angelina PihJennifer A Greene, CNA

## 2017-02-09 NOTE — Anesthesia Pre-Procedure Evaluation (Signed)
Anesthetic History   No history of anesthetic complications            Review of Systems / Medical History  Patient summary reviewed, nursing notes reviewed and pertinent labs reviewed    Pulmonary        Sleep apnea: No treatment    Asthma        Neuro/Psych   Within defined limits           Cardiovascular    Hypertension              Exercise tolerance: >4 METS     GI/Hepatic/Renal  Within defined limits              Endo/Other    Diabetes  Hypothyroidism  Morbid obesity and arthritis     Other Findings              Physical Exam    Airway  Mallampati: III  TM Distance: 4 - 6 cm  Neck ROM: decreased range of motion   Mouth opening: Normal     Cardiovascular  Regular rate and rhythm,  S1 and S2 normal,  no murmur, click, rub, or gallop  Rhythm: regular  Rate: normal         Dental         Pulmonary  Breath sounds clear to auscultation               Abdominal  GI exam deferred       Other Findings            Anesthetic Plan    ASA: 3  Anesthesia type: general          Induction: Intravenous  Anesthetic plan and risks discussed with: Patient and Spouse

## 2017-02-09 NOTE — Progress Notes (Signed)
Problem: Mobility Impaired (Adult and Pediatric)  Goal: *Acute Goals and Plan of Care (Insert Text)  In 1-7 days pt will be able to perform:  STG:  1.  Bed mobility:  Rolling L to R to L modified independent for positioning.  2.  Supine to sit to supine S with HR for meals.  3.  Sit to stand to sit S with RW in prep for ambulation.  LTG:  1.  Gait:  Ambulate >119ft S with RW, WBAT, for home/community mobility.  2.  Stair Negotiation:  Ascend/descend >4 steps CGA with HR for home entry.  3.  Activity tolerance:  Tolerate up in chair 1-2 hours for ADL???s.  4.  Patient/Family Education:  Patient/family to be independent with HEP for follow-up care and safe discharge.  physical Therapy EVALUATION    Patient: Molly [name] 61 y.o. female)  Date: 02/09/2017  Primary Diagnosis: RIGHT HIP OSTEOARTHRITIS  Osteoarthritis of right hip  Procedure(s) (LRB):  RIGHT TOTAL HIP REPLACEMENT ANTERIOR APPROACH WITH C-ARM (Right) Day of Surgery   Precautions:   Fall, WBAT    ASSESSMENT :  Based on the objective data described below, the patient presents with decreased functional mobility and independence in regard to bed mobility, transfers, gt quality and tolerance, R hip strength, pain, stair negotiation and safety due to recent R THA surgery.  Pt rating pain in R hip 5/10 on numerical pain scale.  Pt drowsy but able to follow commands.  Pt required additional time for all mobility and multiple attempts for sit to stand w/ min A for first trial then CGA thereafter.  Pt able to participate in gt training w/ RW, WBAT, GB and CGA/min A w/ antalgic pattern.  Pt able to void on [facility] and perform own hygiene.  Used BSC due to drowsy state.   Pt returned to supine in bed w/ all needs within reach, SCDs applied and ice pack to R hip.   Nurse Nida Boatman aware and family present.  Pt states she is going to rehab upon hospital d/c.    Patient will benefit from skilled intervention to address the above impairments.   Patient???s rehabilitation potential is considered to be Good  Factors which may influence rehabilitation potential include:   []          None noted  []          Mental ability/status  []          Medical condition  []          Home/family situation and support systems  []          Safety awareness  [x]          Pain tolerance/management  []          Other:      PLAN :  Recommendations and Planned Interventions:  [x]            Bed Mobility Training             []     Neuromuscular Re-Education  [x]            Transfer Training                   []     Orthotic/Prosthetic Training  [x]            Gait Training                          []     Modalities  [x]   Therapeutic Exercises          []     Edema Management/Control  [x]            Therapeutic Activities            [x]     Patient and Family Training/Education  []            Other (comment):    Frequency/Duration: Patient will be followed by physical therapy 1-2 times per day/4-7 days per week to address goals.  Discharge Recommendations: Rehab vs HHPT  Further Equipment Recommendations for Discharge: N/A     SUBJECTIVE:   Patient [facility]    OBJECTIVE DATA SUMMARY:     Past Medical History:   Diagnosis Date   ??? Adverse effect of anesthesia     hard to awaken   ??? Arthritis    ??? Asthma    ??? Bronchitis    ??? Constipation    ??? Diabetes (HCC) 2010   ??? Hyperlipidemia    ??? Hypertension 1991   ??? Osteoarthritis of right hip 02/04/2017   ??? Palpitations    ??? Pneumonia    ??? Sleep apnea     mouthpiece   ??? Thyroid disease     nodules   ??? Urinary urgency      Past Surgical History:   Procedure Laterality Date   ??? ABDOMEN SURGERY PROC UNLISTED     ??? HX CHOLECYSTECTOMY     ??? HX GI      abd laparoscopy   ??? HX HYSTERECTOMY     ??? HX ORTHOPAEDIC      ctr right     Barriers to Learning/Limitations: None  Compensate with: visual, verbal, tactile, kinesthetic cues/model  Prior Level of Function/Home Situation:   Home Situation  Home Environment: Private residence   # Steps to Enter: 5  Rails to Enter: Yes  Hand Rails : Bilateral  One/Two Story Residence: One story  Living Alone: No  Support Systems: Games developerpouse/Significant Other/Partner, Family member(s)  Patient Expects to be Discharged to:: Rehabilitation facility  Current DME Used/Available at Home: Gilmer Morane, straight, Environmental consultantWalker, rolling  Critical Behavior:  Neurologic State: Appropriate for age;Drowsy  Orientation Level: Oriented to person;Oriented to place;Oriented to situation  Cognition: Follows commands;Appropriate for age attention/concentration  Safety/Judgement: Awareness of environment  Psychosocial  Patient Behaviors: Calm;Cooperative  Family  Behaviors: Supportive;Calm  Purposeful Interaction: Yes  Pt Identified Daily Priority: Clinical issues (comment)  Caritas Process: Nurture loving kindness;Establish trust;Teaching/learning;Create healing environment;Supportive expression  Caring Interventions: Reassure  Reassure: Informing;Caring rounds;Support family  Skin Condition/Temp: Dry;Warm  Family  Behaviors: Supportive;Calm  Skin Integrity: Incision (comment) (R hip)  Skin Integumentary  Skin Color: Appropriate for ethnicity  Skin Condition/Temp: Dry;Warm  Skin Integrity: Incision (comment) (R hip)  Turgor: Non-tenting  Hair Growth: Present  Varicosities: Absent  Strength:    Strength: Generally decreased, functional  Tone & Sensation:   Tone: Normal  Sensation: Intact  Range Of Motion:  AROM: Generally decreased, functional  Functional Mobility:  Bed Mobility:  Supine to Sit: Minimum assistance;Additional time (vc)  Sit to Supine: Minimum assistance;Additional time (vc)  Scooting: Contact guard assistance;Additional time (vc)  Transfers:  Sit to Stand: Minimum assistance;Contact guard assistance;Additional time (vc)  Stand to Sit: Contact guard assistance;Additional time (vc)  Balance:   Sitting: Intact  Standing: Intact;With support  Ambulation/Gait Training:  Distance (ft): 20 Feet (ft)   Assistive Device: Walker, rolling;Gait belt  Ambulation - Level of Assistance: Contact guard assistance;Additional  time;Minimal assistance (vc)  Gait Abnormalities: Antalgic;Decreased step clearance;Step to gait  Right Side Weight Bearing: As tolerated  Base of Support: Shift to left  Stance: Right decreased  Speed/Cadence: Slow  Step Length: Left shortened;Right shortened  Swing Pattern: Left asymmetrical;Right asymmetrical  Interventions: Tactile cues;Safety awareness training;Verbal cues  Therapeutic Exercises:   HEP written copy issued to pt per MD protocol.  Pain:  Pain Scale 1: Numeric (0 - 10)  Pain Intensity 1: 5  Pain Location 1: Hip  Pain Orientation 1: Right  Pain Description 1: Aching  Pain Intervention(s) 1: Rest  Activity Tolerance:   Fair   Please refer to the flowsheet for vital signs taken during this treatment.  After treatment:   []          Patient left in no apparent distress sitting up in chair  [x]          Patient left in no apparent distress in bed  [x]          Call bell left within reach  [x]          Nursing notified  [x]          Family present  []          Bed alarm activated    COMMUNICATION/EDUCATION:   [x]          Fall prevention education was provided and the patient/caregiver indicated understanding.  [x]          Patient/family have participated as able in goal setting and plan of care.  [x]          Patient/family agree to work toward stated goals and plan of care.  []          Patient understands intent and goals of therapy, but is neutral about his/her participation.  []          Patient is unable to participate in goal setting and plan of care.    Thank you for this referral.  Darnelle Going, PT   Time Calculation: 33 mins    Eval Complexity: History: HIGH Complexity :3+ comorbidities / personal factors will impact the outcome/ POC Exam:MEDIUM Complexity : 3 Standardized tests and measures addressing body structure, function,  activity limitation and / or participation in recreation  Presentation: LOW Complexity : Stable, uncomplicated  Clinical Decision Making:Medium Complexity amb <30' Overall Complexity:LOW

## 2017-02-09 NOTE — Other (Signed)
Room is 204 and nurse is Secondary school teacherJali reviewing SBAR, family updated via greeter

## 2017-02-09 NOTE — Other (Signed)
Pt. Used restroom in pre-op area with assistance.   Patient placed on Bair Paws for a minimum of 30 min in  Preop.

## 2017-02-09 NOTE — Progress Notes (Addendum)
1925 - [facility]. Patient in bed.  Pain 0/10.     2141 - Patient in bed at this time. IV to R FA  intact and patent.  Sequential compression device bilaterally. TEDs to BLE.  Dressing to R hip CDI. + CMS. Pt A & O x 4. LS clear, on RA. Abdomen soft, NT and ND. + BS to all 4 quadrants. Denies nausea. Pain 0/10. Call light within reach.  Medications given.  Potential side effects explained to patient, patient verbalizes understanding, opportunities for questions provided.  Patient stable, No apparent distress at this time, bed in locked position, call bell and phone within reach.    0425-Pt denies any needs.    Pt had uneventful shift. Uses IS every hour while awake.Pt ambulated with assistance with a walker. Pain remained well-controlled with medication. No issues/concerns at this time. Call bell within reach

## 2017-02-09 NOTE — Interval H&P Note (Signed)
H&P Update:  Molly [name] was seen and examined.  History and physical has been reviewed. The patient has been examined. There have been no significant clinical changes since the completion of the originally dated History and Physical.    Signed By: Derryl HarborAnthony T Nickolaus Bordelon, MD     February 09, 2017 7:05 AM

## 2017-02-09 NOTE — Other (Signed)
Bedside and Verbal shift change report given to Levell JulyEva. N. RN (oncoming nurse) by Nida BoatmanJiali. W.  RN (offgoing nurse). Report included the following information Intake/Output, MAR and Alarm Parameters .   ??25

## 2017-02-09 NOTE — Other (Signed)
PT. Did not have to use restroom @ 7:12 in pre-op.

## 2017-02-09 NOTE — Other (Signed)
Patient arrived to PACU at 0857  Report received from Maye HidesShelly F  , CRNA and Felicity CoyerKelly R RN regarding patient .   Surgeon(s):Carter and Procedure(s): verified   Confirmed with allergies and dressings discussed.   Anesthesia type, drugs, patient history, complications, estimated blood loss, vital signs, intake and output, and last pain medication, lines, reversal medications and temperature were reviewed.   PACU monitoring initiated. See doc flow sheet for detailed physical assessment.

## 2017-02-09 NOTE — Discharge Summary (Addendum)
[facility]   [address] [facility] [place] [address]     DISCHARGE SUMMARY     PATIENT: Molly [name]     MRN: [id number]   ADMIT DATE: 02/09/2017   BILLING: [id number]   DISCHARGE DATE:      ATTENDING: Rosary Lively, MD   DICTATING: Hermenia Bers, PA     ADMISSION DIAGNOSIS: RIGHT HIP OSTEOARTHRITIS  Osteoarthritis of right hip    DISCHARGE DIAGNOSIS: Status post RIGHT TOTAL HIP ARTHROPLASTY    HISTORY OF PRESENT ILLNESS: The patient is a 61 y.o. year-old female   with ongoing right hip pain secondary to osteoarthritis of right hip. The patient's pain has persisted and progressed despite conservative treatments and therapies. The patient has at this time opted for surgical intervention.    PAST MEDICAL HISTORY:   Past Medical History:   Diagnosis Date   ??? Adverse effect of anesthesia     hard to awaken   ??? Arthritis    ??? Asthma    ??? Bronchitis    ??? Constipation    ??? Diabetes (HCC) 2010   ??? Hyperlipidemia    ??? Hypertension 1991   ??? Osteoarthritis of right hip 02/04/2017   ??? Palpitations    ??? Pneumonia    ??? Sleep apnea     mouthpiece   ??? Thyroid disease     nodules   ??? Urinary urgency        PAST SURGICAL HISTORY:   Past Surgical History:   Procedure Laterality Date   ??? ABDOMEN SURGERY PROC UNLISTED     ??? HX CHOLECYSTECTOMY     ??? HX GI      abd laparoscopy   ??? HX HYSTERECTOMY     ??? HX ORTHOPAEDIC      ctr right       ALLERGIES:   Allergies   Allergen Reactions   ??? Entex [Phenylephrine-Guaifenesin] Rash        CURRENT MEDICATIONS:  A list of medications prior to the time of admission include:  Prior to Admission medications    Medication Sig Start Date End Date Taking? Authorizing Provider   OTHER Take 2 Units by mouth daily.   Yes Historical Provider   OLIVE LEAF EXTRACT PO Take 2 Units by mouth daily.   Yes Historical Provider   aspirin (ASPIRIN) 325 mg tablet Take 1 Tab by mouth two (2) times a day for 21 days. 02/09/17 03/02/17 Yes Jenna A Crockett, PA    meloxicam (MOBIC) 7.5 mg tablet Take 1 Tab by mouth two (2) times a day for 14 days. 02/09/17 02/23/17 Yes Jenna A Crockett, PA   oxyCODONE-acetaminophen (PERCOCET) 5-325 mg per tablet Take 1-2 tablets by mouth every 4-6 hours as needed for pain. 02/09/17  Yes Jenna A Crockett, PA   cefadroxil (DURICEF) 500 mg capsule Take 1 Cap by mouth two (2) times a day for 5 days. 02/09/17 02/14/17 Yes Jenna A Crockett, PA   cyanocobalamin (VITAMIN B-12) 1,000 mcg/mL injection 1,000 mcg by IntraMUSCular route every thirty (30) days.   Yes Historical Provider   carvedilol (COREG) 12.5 mg tablet Take 12.5 mg by mouth two (2) times a day. Indications: hypertension   Yes Historical Provider   solifenacin (VESICARE) 10 mg tablet Take 10 mg by mouth. Indications: URINARY URGENCY   Yes Historical Provider   VITAMIN D3 1,000 unit tablet daily. 12/18/16  Yes Historical Provider   magnesium oxide (MAG-OX) 400 mg tablet daily. 12/19/16  Yes Historical Provider   meloxicam (MOBIC) 15 mg tablet daily. 10/24/16  Yes Historical Provider   VITAMIN B-6 50 mg tablet daily. 12/18/16  Yes Historical Provider   telmisartan-hydroCHLOROthiazide (MICARDIS HCT) 80-25 mg per tablet daily. Indications: hypertension 10/29/16  Yes Historical Provider   loratadine (CLARITIN) 10 mg tablet Take 10 mg by mouth daily as needed for Allergies. Indications: Allergic Rhinitis   Yes Historical Provider   OTHER 84 mg. Indications: herbal potassium   Yes Historical Provider   fluticasone-salmeterol (ADVAIR) 250-50 mcg/dose diskus inhaler Take 1 Puff by inhalation daily. Indications: MAINTENANCE THERAPY FOR ASTHMA   Yes Phys Other, MD   NIFEdipine ER (ADALAT CC) 30 mg ER tablet Take 30 mg by mouth daily. Indications: hypertension   Yes Phys Other, MD   metFORMIN (GLUCOPHAGE) 500 mg tablet Take 500 mg by mouth daily (with breakfast).   Yes Phys Other, MD       FAMILY HISTORY:   Family History   Problem Relation Age of Onset   ??? Arrhythmia Mother    ??? Heart Attack Mother     ??? Coronary Artery Disease Maternal Uncle    ??? Heart Attack Maternal Uncle        SOCIAL HISTORY:   Social History     Social History   ??? Marital status: MARRIED     Spouse name: N/A   ??? Number of children: N/A   ??? Years of education: N/A     Social History Main Topics   ??? Smoking status: Never Smoker   ??? Smokeless tobacco: Never Used   ??? Alcohol use No   ??? Drug use: No   ??? Sexual activity: Not Asked     Other Topics Concern   ??? None     Social History Narrative       REVIEW OF SYSTEMS: All review of systems are negative.    PHYSICAL EXAMINATION: For a detailed physical exam, please refer to the patient's chart.    HOSPITAL COURSE: The patient was taken to surgery the day of admission. she underwent right total hip replacement via the anterior approach. Operative course was benign. Estimated blood loss approximately 300 cc. The patient was taken to the PACU in stable condition and was later taken to the floor in stable condition.    Post-op Day #1, patient has done very well.  she has had little to no pain.  she had been cleared by physical therapy with stair training.  she was placed on Aspirin for DVT prophylaxis.  her vitals have remained stable.  she has also remained hemodynamically stable. The patient has been recommended for discharge to SNF.    DISCHARGE INSTRUCTIONS: The patient is to be discharged to SNF. she is to continue on her prior medications per the medication reconciliation form, to which we will add:         1)  Aspirin 325 mg; 1 tablet p.o. b.i.d. X 21 days         2)  Mobic 7.5 mg; 1 tablet p.o. BID x 14 days           3)  Percocet 5/325 mg; 1-2 tablets p.o. every 4 to 6 hours p.r.n. for pain         4)  Duricef 500 mg; 1 tablet po bid x 5 days      The patient is to continue physical therapy per rehab protocols to work on gait training, range of motion, strengthening, and weightbearing exercises as  tolerated on her right lower extremity. The patient is to progress from  a walker to a cane to complete total weightbearing as tolerable. The patient is to continue to keep her incision dry.  The patient is to followup with Dr. Rosary LivelyAnthony Carter, Reginal LutesJenna Crockett PA-C, and/or [facility] Sonny Poth PA-C in the office approximately 10-14 days status post for x-rays and further evaluation.      Hermenia BersJenna A Crockett, PA  02/10/2017

## 2017-02-09 NOTE — Anesthesia Post-Procedure Evaluation (Signed)
Post-Anesthesia Evaluation and Assessment    Cardiovascular Function/Vital Signs  Visit Vitals   ??? BP 115/61   ??? Pulse 64   ??? Temp 36.7 ??C (98 ??F)   ??? Resp 10   ??? Ht 5' 2"  (1.575 m)   ??? Wt 96.9 kg (213 lb 9 oz)   ??? SpO2 100%   ??? BMI 39.06 kg/m2       Patient is status post Procedure(s):  RIGHT TOTAL HIP REPLACEMENT ANTERIOR APPROACH WITH C-ARM.    Nausea/Vomiting: Controlled.    Postoperative hydration reviewed and adequate.    Pain:  Pain Scale 1: FLACC (02/09/17 0930)  Pain Intensity 1: 0 (02/09/17 0930)   Managed.    Neurological Status:   Neuro (WDL): Exceptions to WDL (02/09/17 0930)   At baseline.    Mental Status and Level of Consciousness: Baseline and stable.    Pulmonary Status:   O2 Device: Nasal cannula (02/09/17 6301)   Adequate oxygenation and airway patent.    Complications related to anesthesia: None    Post-anesthesia assessment completed. No concerns.    Patient has met all discharge requirements.    Signed By: Winferd Humphrey, MD

## 2017-02-09 NOTE — Other (Signed)
Dual skin assessment is WDL, as verified by S. Hudgins rn.

## 2017-02-09 NOTE — Other (Signed)
TRANSFER - OUT REPORT:    Verbal report given to Jaili RN(name) on [name]  being transferred to 2 south(unit) for routine post - op       Report consisted of patient???s Situation, Background, Assessment and   Recommendations(SBAR).     Information from the following report(s) OR Summary, Intake/Output and MAR was reviewed with the receiving nurse.    Lines:   Peripheral IV 02/09/17 Right Forearm (Active)   Site Assessment Clean, dry, & intact 02/09/2017  9:38 AM   Phlebitis Assessment 0 02/09/2017  9:38 AM   Infiltration Assessment 0 02/09/2017  9:38 AM   Dressing Status Clean, dry, & intact 02/09/2017  9:38 AM   Dressing Type Transparent;Tape 02/09/2017  9:38 AM   Hub Color/Line Status Infusing 02/09/2017  9:38 AM        Opportunity for questions and clarification was provided.      Patient transported with:   Registered Nurse  Tech

## 2017-02-09 NOTE — Discharge Summary (Signed)
Discharge Summary by Cathlean Sauer, PA at 02/09/17 1029                Author: Cathlean Sauer, PA  Service: Orthopedic Surgery  Author Type: Physician Assistant       Filed: 02/10/17 1415  Date of Service: 02/09/17 1029  Status: Addendum          Editor: Cathlean Sauer, PA (Physician Assistant)       Related Notes: Original Note by Hermenia Bers, PA (Physician Assistant) filed at 02/10/17  [phone]          Cosigner: Derryl Harbor, MD at 02/10/17 1436                      [facility]    [address] [facility] [place] [address]       DISCHARGE SUMMARY           PATIENT:  Molly [name]             MRN:  [id number]     ADMIT DATE:  02/09/2017          BILLING:  [id number]     DISCHARGE DATE:             ATTENDING:  Rosary Lively, MD     DICTATING:  Hermenia Bers, PA        ADMISSION DIAGNOSIS: RIGHT HIP OSTEOARTHRITIS   Osteoarthritis of right hip      DISCHARGE DIAGNOSIS: Status post RIGHT TOTAL HIP ARTHROPLASTY      HISTORY OF PRESENT ILLNESS: The patient is a 61 y.o.  year-old female    with ongoing right hip pain secondary to osteoarthritis of right hip. The patient's pain has persisted and progressed despite conservative treatments and therapies. The patient has at this time opted for surgical intervention.      PAST MEDICAL HISTORY:      Past Medical History:        Diagnosis  Date         ?  Adverse effect of anesthesia            hard to awaken         ?  Arthritis       ?  Asthma       ?  Bronchitis       ?  Constipation       ?  Diabetes (HCC)  2010     ?  Hyperlipidemia       ?  Hypertension  1991     ?  Osteoarthritis of right hip  02/04/2017     ?  Palpitations       ?  Pneumonia       ?  Sleep apnea            mouthpiece         ?  Thyroid disease            nodules         ?  Urinary urgency             PAST SURGICAL HISTORY:      Past Surgical History:         Procedure  Laterality  Date          ?  ABDOMEN SURGERY PROC UNLISTED         ?  HX  CHOLECYSTECTOMY         ?  HX GI              abd laparoscopy          ?  HX HYSTERECTOMY         ?  HX ORTHOPAEDIC              ctr right           ALLERGIES:      Allergies        Allergen  Reactions         ?  Entex [Phenylephrine-Guaifenesin]  Rash            CURRENT MEDICATIONS:  A list of medications prior to the time of admission include:     Prior to Admission medications             Medication  Sig  Start Date  End Date  Taking?  Authorizing Provider            OTHER  Take 2 Units by mouth daily.      Yes  Historical Provider     OLIVE LEAF EXTRACT PO  Take 2 Units by mouth daily.      Yes  Historical Provider     aspirin (ASPIRIN) 325 mg tablet  Take 1 Tab by mouth two (2) times a day for 21 days.  02/09/17  03/02/17  Yes  Jenna A Crockett, PA     meloxicam (MOBIC) 7.5 mg tablet  Take 1 Tab by mouth two (2) times a day for 14 days.  02/09/17  02/23/17  Yes  Jenna A Crockett, PA     oxyCODONE-acetaminophen (PERCOCET) 5-325 mg per tablet  Take 1-2 tablets by mouth every 4-6 hours as needed for pain.  02/09/17    Yes  Jenna A Crockett, PA     cefadroxil (DURICEF) 500 mg capsule  Take 1 Cap by mouth two (2) times a day for 5 days.  02/09/17  02/14/17  Yes  Jenna A Crockett, PA     cyanocobalamin (VITAMIN B-12) 1,000 mcg/mL injection  1,000 mcg by IntraMUSCular route every thirty (30) days.      Yes  Historical Provider     carvedilol (COREG) 12.5 mg tablet  Take 12.5 mg by mouth two (2) times a day. Indications: hypertension      Yes  Historical Provider     solifenacin (VESICARE) 10 mg tablet  Take 10 mg by mouth. Indications: URINARY URGENCY      Yes  Historical Provider     VITAMIN D3 1,000 unit tablet  daily.  12/18/16    Yes  Historical Provider     magnesium oxide (MAG-OX) 400 mg tablet  daily.  12/19/16    Yes  Historical Provider     meloxicam (MOBIC) 15 mg tablet  daily.  10/24/16    Yes  Historical Provider     VITAMIN B-6 50 mg tablet  daily.  12/18/16    Yes  Historical Provider      telmisartan-hydroCHLOROthiazide (MICARDIS HCT) 80-25 mg per tablet  daily. Indications: hypertension  10/29/16    Yes  Historical Provider            loratadine (CLARITIN) 10 mg tablet  Take 10 mg by mouth daily as needed for Allergies. Indications: Allergic Rhinitis      Yes  Historical Provider            OTHER  84 mg. Indications: herbal potassium  Yes  Historical Provider     fluticasone-salmeterol (ADVAIR) 250-50 mcg/dose diskus inhaler  Take 1 Puff by inhalation daily. Indications: MAINTENANCE THERAPY FOR ASTHMA      Yes  Phys Other, MD     NIFEdipine ER (ADALAT CC) 30 mg ER tablet  Take 30 mg by mouth daily. Indications: hypertension      Yes  Phys Other, MD            metFORMIN (GLUCOPHAGE) 500 mg tablet  Take 500 mg by mouth daily (with breakfast).      Yes  Phys Other, MD           FAMILY HISTORY:      Family History         Problem  Relation  Age of Onset          ?  Arrhythmia  Mother       ?  Heart Attack  Mother       ?  Coronary Artery Disease  Maternal Uncle            ?  Heart Attack  Maternal Uncle             SOCIAL HISTORY:      Social History          Social History         ?  Marital status:  MARRIED              Spouse name:  N/A         ?  Number of children:  N/A         ?  Years of education:  N/A          Social History Main Topics         ?  Smoking status:  Never Smoker     ?  Smokeless tobacco:  Never Used     ?  Alcohol use  No     ?  Drug use:  No         ?  Sexual activity:  Not Asked           Other Topics  Concern        ?  None          Social History Narrative           REVIEW OF SYSTEMS: All review of systems are negative.      PHYSICAL EXAMINATION: For a detailed physical exam, please refer to the patient's chart.      HOSPITAL COURSE: The patient was taken to surgery the day of admission. she  underwent right total hip replacement via the anterior approach. Operative course was benign. Estimated blood loss approximately 300 cc. The patient was taken to the PACU in stable  condition and was later taken to the floor in stable condition.      Post-op Day #1, patient has done very well.  she has had little to no pain.  she  had been cleared by physical therapy with stair training.  she was placed on Aspirin  for DVT prophylaxis.  her vitals have remained stable.  she  has also remained hemodynamically stable. The patient has been recommended for discharge to SNF.      DISCHARGE INSTRUCTIONS: The patient is to be discharged to SNF. she  is to continue on her prior medications per the medication reconciliation form, to which we will add:  1)  Aspirin 325 mg; 1 tablet p.o. b.i.d. X 21 days          2)  Mobic 7.5 mg; 1 tablet p.o. BID x 14 days            3)  Percocet 5/325 mg; 1-2 tablets p.o. every 4 to 6 hours p.r.n. for pain          4)  Duricef 500 mg; 1 tablet po bid x 5 days         The patient is to continue physical therapy per rehab protocols to work on gait training, range of motion, strengthening, and weightbearing exercises as tolerated on her  right lower extremity. The patient is to progress from a walker to a cane to complete total weightbearing as tolerable. The patient is to continue to keep her  incision dry.  The patient is to followup with Dr. Rosary Lively, Reginal Lutes PA-C, and/or [facility] PA-C in the office approximately 10-14 days status post for x-rays and further evaluation.         Hermenia Bers, PA   02/10/2017

## 2017-02-09 NOTE — Op Note (Signed)
Op Notes  by Derryl Harbor, MD at 02/09/17 1015                Author: Derryl Harbor, MD  Service: Orthopedic Surgery  Author Type: Physician       Filed: 02/09/17 1016  Date of Service: 02/09/17 1015  Status: Signed          Editor: Derryl Harbor, MD (Physician)                       [facility]   Total Hip Arthroplasty             Patient: Molly [name]  MRN: [id number]   SSN: [id number]          Date of Birth: 22-Jun-1956   Age: 61 y.o.   Sex: female         Date of Surgery: 02/09/2017    Preoperative Diagnosis: RIGHT HIP OSTEOARTHRITIS    Postoperative Diagnosis: RIGHT HIP OSTEOARTHRITIS    [facility]   Surgeon: Derryl Harbor, MD   Assistant: Reginal Lutes PA-C      Anesthesia: general      Procedure: Total Right  Hip Arthroplasty      Findings: Degenerative joint disease of the right  hip.       Estimated Blood Loss:      Specimens: None      Implants:              Implant Name  Type  Inv. Item  Serial No.  Manufacturer  Lot No.  LRB  No. Used  Action     CUP ACET SECTOR GRIPTION -- TI - [id number]    CUP ACET SECTOR GRIPTION -- TI    [employer]  [id number]  Right  1  Implanted     LINER ACET PINN NEUT [id number] -- ALTRX - [id number]    LINER ACET PINN NEUT 32X50 -- ALTRX    [employer]  [id number]  Right  1  Implanted     4 STEM          [id number]  Right  1  Implanted               HEAD FEM CER +5MM NK -- DELTA - [id number]     HEAD FEM CER +5MM NK -- DELTA     [employer]  [id number]  Right  1  Implanted           Procedure Detail:   After the patient was brought to the operating suite, She was effectively anesthetized using general anesthesia, then transferred to the Hana table and secured in a standard fashion. Her  right hip was then prepped and draped in a normal sterile orthopedic fashion. She was given appropriate intravenous antibiotics preoperatively. After  a proper timeout was  performed, a direct anterior approach to the hip was  performed using a short Smith-Petersen interval. Anterior capsulotomy was performed. The degenerative changes of the hip were noted. Femoral neck osteotomy was then performed to the templated area. The head and neck were removed. The pulvinar and labrum  were excised. The acetabulum was then reamed up to 50 mm with good bleeding cancellous bone obtained. The cup was then irrigated with pulse  lavage system. A 50 mm Gription cup was then impacted in place with excellent stable fixation obtained, placing the cup at  about 45 degrees  of abduction, 20 degrees of anteversion. The liner was then impacted in place. A screw was not placed.      Attention was turned to the femur, which was delivered into the wound with a combination of extension, external rotation, and adduction, and using the hook on the Hana table to deliver the femur into the wound. The canal was broached up to a size  4 for the Actis stem system with excellent stable fixation obtained. A trial  reduction was then performed with the standard neck offset and 32 mm head balls with various neck lengths. With the  +5, she appeared to have equalization of leg lengths and restoration of offset radiographically, and excellent functional stability was noted. The trial broach was removed. The canal was irrigated  with the pulse lavage system. The final components were impacted in place with excellent stable fixation obtained once again. The final reduction was performed and once again leg lengths and offset were restored radiographically, using the C-arm radiographically  intraoperatively, and excellent functional stability was noted. The wound was then irrigated one more time, and then closed in layers.  The fascia of the tensor was closed with #1 Vicryl in a running type stitch. Subcutaneous tissue was closed with 2-0 Vicryl in a simple buried stitch, and the skin was closed with Prineo. Dry, sterile dressing  was then applied. She tolerated this well,  was transferred to the bed, and taken to recovery room, extubated, in stable condition. All sponge and needle counts were correct.         Signed By:  Derryl HarborAnthony T Chisom Aust, MD           February 09, 2017

## 2017-02-10 ENCOUNTER — Inpatient Hospital Stay: Admit: 2017-02-10 | Payer: TRICARE (CHAMPUS) | Primary: Family Medicine

## 2017-02-10 LAB — METABOLIC PANEL, BASIC
Anion gap: 6 mmol/L (ref 3.0–18)
BUN/Creatinine ratio: 18 (ref 12–20)
BUN: 19 MG/DL — ABNORMAL HIGH (ref 7.0–18)
CO2: 30 mmol/L (ref 21–32)
Calcium: 8.4 MG/DL — ABNORMAL LOW (ref 8.5–10.1)
Chloride: 105 mmol/L (ref 100–108)
Creatinine: 1.03 MG/DL (ref 0.6–1.3)
GFR est AA: >60 mL/min/{1.73_m2} (ref >60)
GFR est non-AA: 54 mL/min/{1.73_m2} — ABNORMAL LOW (ref >60)
Glucose: 132 mg/dL — ABNORMAL HIGH (ref 74–99)
Potassium: 3.5 mmol/L (ref 3.5–5.5)
Sodium: 141 mmol/L (ref 136–145)

## 2017-02-10 LAB — GLUCOSE, POC
Glucose (POC): 144 mg/dL — ABNORMAL HIGH (ref 70–110)
Glucose (POC): 120 mg/dL — ABNORMAL HIGH (ref 70–110)
Glucose (POC): 110 mg/dL (ref 70–110)

## 2017-02-10 MED FILL — MELOXICAM 7.5 MG TAB: 7.5 mg | ORAL | Qty: 1

## 2017-02-10 MED FILL — OXYCODONE 5 MG TAB: 5 mg | ORAL | Qty: 1

## 2017-02-10 MED FILL — METFORMIN 500 MG TAB: 500 mg | ORAL | Qty: 1

## 2017-02-10 MED FILL — ACETAMINOPHEN 325 MG TABLET: 325 mg | ORAL | Qty: 2

## 2017-02-10 MED FILL — DOCUSATE SODIUM 100 MG CAP: 100 mg | ORAL | Qty: 1

## 2017-02-10 MED FILL — CARVEDILOL 12.5 MG TAB: 12.5 mg | ORAL | Qty: 1

## 2017-02-10 MED FILL — KETOROLAC TROMETHAMINE 15 MG/ML INJECTION: 15 mg/mL | INTRAMUSCULAR | Qty: 1

## 2017-02-10 MED FILL — FERROUS SULFATE 325 MG (65 MG ELEMENTAL IRON) TAB: 325 mg (65 mg iron) | ORAL | Qty: 1

## 2017-02-10 MED FILL — FLU VACCINE QV 2017-18 (36 MOS+)(PF) 60 MCG (15 MCG X 4)/0.5 ML IM SYRINGE: 60 mcg (15 mcg x 4)/0.5 mL | INTRAMUSCULAR | Qty: 0.5

## 2017-02-10 MED FILL — MAGNESIUM OXIDE 400 MG TAB: 400 mg | ORAL | Qty: 1

## 2017-02-10 MED FILL — VITAMIN B-6 50 MG TABLET: 50 mg | ORAL | Qty: 1

## 2017-02-10 MED FILL — CEFAZOLIN 2 GM/50 ML IN DEXTROSE (ISO-OSMOTIC) IVPB: 2 gram/50 mL | INTRAVENOUS | Qty: 50

## 2017-02-10 MED FILL — TELMISARTAN 80 MG TAB: 80 mg | ORAL | Qty: 1

## 2017-02-10 MED FILL — CHOLECALCIFEROL (VITAMIN D3) 1,000 UNIT (25 MCG) TAB: ORAL | Qty: 1

## 2017-02-10 MED FILL — ASPIRIN 81 MG TAB, DELAYED RELEASE: 81 mg | ORAL | Qty: 1

## 2017-02-10 MED FILL — VESICARE 5 MG TABLET: 5 mg | ORAL | Qty: 2

## 2017-02-10 MED FILL — HYDROCHLOROTHIAZIDE 25 MG TAB: 25 mg | ORAL | Qty: 1

## 2017-02-10 NOTE — Progress Notes (Addendum)
Chart reviewed, noted plan for SNF. FOC offered, [facility] referral placed with CMS. CM will follow to transition pt to SNF. Pt will need insurance approval to transition to next level of care.     [facility] pt does not need insurance approval for SNF and [facility] [address], [place], [place]. [phone] for report.   Please include all hard scripts for controlled substances, med rec and dc summary in packet. Please medicate for pain prior to dc if possible and needed to help offset delay when patient first arrives to facility.   Pt will need medical transport for safety, is aware she may incur cost for transport.     Care Management Interventions  PCP Verified by CM: Yes  Transition of Care Consult (CM Consult): SNF  Partner SNF: Yes  Physical Therapy Consult: Yes  Occupational Therapy Consult: Yes  Current Support Network: Own Home  Confirm Follow Up Transport: Family  Plan discussed with Pt/Family/Caregiver: Yes  Freedom of Choice Offered: Yes  Discharge Location  Discharge Placement: Skilled nursing facility

## 2017-02-10 NOTE — Progress Notes (Signed)
Problem: Mobility Impaired (Adult and Pediatric)  Goal: *Acute Goals and Plan of Care (Insert Text)  In 1-7 days pt will be able to perform:  STG:  1.  Bed mobility:  Rolling L to R to L modified independent for positioning.  2.  Supine to sit to supine S with HR for meals.  3.  Sit to stand to sit S with RW in prep for ambulation.  LTG:  1.  Gait:  Ambulate >15150ft S with RW, WBAT, for home/community mobility.  2.  Stair Negotiation:  Ascend/descend >4 steps CGA with HR for home entry.  3.  Activity tolerance:  Tolerate up in chair 1-2 hours for ADL???s.  4.  Patient/Family Education:  Patient/family to be independent with HEP for follow-up care and safe discharge.   Outcome: Progressing Towards Goal  physical Therapy TREATMENT    Patient: Molly [name] ([age] y.o. female)  Date: 02/10/2017  Diagnosis: RIGHT HIP OSTEOARTHRITIS  Osteoarthritis of right hip Osteoarthritis of right hip  Procedure(s) (LRB):  RIGHT TOTAL HIP REPLACEMENT ANTERIOR APPROACH WITH C-ARM (Right) 1 Day Post-Op  Precautions: Fall, WBAT  Chart, physical therapy assessment, plan of care and goals were reviewed.    ASSESSMENT:  Pt sitting up in chair upon arrival.  Pt rating pain in R hip 3/10 on numerical pain scale.  Pt able to participate in sitting therapeutic exercises w/ vc for technique.  Pt able to participate in transfer training and gt training.  Pt gt pattern remains antalgic and step to w/ pattern during training w/ RW, GB, WBAT and CGA.  Pt returned to sitting in chair upon request w/ all needs within reach and ice pack to R hip.  Son present and Nurse Marci aware.  Progression toward goals:  []       Improving appropriately and progressing toward goals  [x]       Improving slowly and progressing toward goals  []       Not making progress toward goals and plan of care will be adjusted     PLAN:  Patient continues to benefit from skilled intervention to address the above impairments.  Continue treatment per established plan of care.   Discharge Recommendations:  To Be Determined  Further Equipment Recommendations for Discharge:  N/A     SUBJECTIVE:   Patient stated ???I have been doing okay.???    OBJECTIVE DATA SUMMARY:   Critical Behavior:  Neurologic State: Alert, Appropriate for age  Orientation Level: Oriented X4  Cognition: Appropriate decision making, Appropriate for age attention/concentration, Appropriate safety awareness, Follows commands  Safety/Judgement: Awareness of environment  Functional Mobility Training:  Bed Mobility:  Scooting: Stand-by assistance  Transfers:  Sit to Stand: Contact guard assistance;Stand-by assistance  Stand to Sit: Contact guard assistance;Stand-by assistance  Balance:  Sitting: Intact  Standing: Intact;With support   Range Of Motion:   AROM: Within functional limits  Ambulation/Gait Training:  Distance (ft): 60 Feet (ft)  Assistive Device: Walker, rolling;Gait belt  Ambulation - Level of Assistance: Contact guard assistance (vc)  Gait Abnormalities: Antalgic;Decreased step clearance;Step to gait  Right Side Weight Bearing: As tolerated  Base of Support: Shift to left  Stance: Right decreased  Speed/Cadence: Slow  Step Length: Left shortened;Right shortened  Swing Pattern: Left asymmetrical;Right asymmetrical  Interventions: Safety awareness training;Tactile cues;Verbal cues  Neuro Re-Education:  Therapeutic Exercises:   Instructed and performed ankle pumps, ankle circles, LAQ and pillow add squeeze x10 reps each B.  Pain:  Pain Scale 1: Numeric (0 - 10)  Pain Intensity 1: 3  Pain Location 1: Hip  Pain Orientation 1: Right  Pain Description 1: Sore;Tightness  Pain Intervention(s) 1: Medication (see MAR)  Activity Tolerance:   Fair   Please refer to the flowsheet for vital signs taken during this treatment.  After treatment:   [x]  Patient left in no apparent distress sitting up in chair  []  Patient left in no apparent distress in bed  [x]  Call bell left within reach  [x]  Nursing notified  [x]  Son present   []  Bed alarm activated      Darnelle Going, PT   Time Calculation: 18 mins

## 2017-02-10 NOTE — Other (Signed)
[id number] - Bedside report received from Felipe DroneEva Njunge RN. Patient in chair at this time.  Pain 3/10. Plan of care for the day addressed with the patient.

## 2017-02-10 NOTE — Other (Signed)
Bedside and Verbal shift change report given to Merci, RN by Evangeline Njunge,RN. Report included the following information SBAR, Kardex, OR Summary, Intake/Output and MAR.

## 2017-02-10 NOTE — Progress Notes (Signed)
Problem: Falls - Risk of  Goal: *Absence of Falls  Document Schmid Fall Risk and appropriate interventions in the flowsheet.   Outcome: Progressing Towards Goal  Fall Risk Interventions:  Mobility Interventions: Utilize walker, cane, or other assitive device, Patient to call before getting OOB, PT Consult for mobility concerns, PT Consult for assist device competence, Strengthening exercises (ROM-active/passive), Communicate number of staff needed for ambulation/transfer         Medication Interventions: Patient to call before getting OOB, Teach patient to arise slowly    Elimination Interventions: Call light in reach, Elevated toilet seat, Patient to call for help with toileting needs, Toilet paper/wipes in reach, Toileting schedule/hourly rounds

## 2017-02-10 NOTE — Other (Signed)
Chart reviewed for turnover time.

## 2017-02-10 NOTE — Other (Signed)
TRANSFER - OUT REPORT:    Verbal report given to Arna MediciJoyce Seher LPN(name) on Molly [name]  being [facility] [place] [facility] for routine progression of care       Report consisted of patient???s Situation, Background, Assessment and   Recommendations(SBAR).     Information from the following report(s) SBAR, Kardex, OR Summary, Intake/Output, MAR and Recent Results was reviewed with the receiving nurse.    Lines:       Opportunity for questions and clarification was provided.      Patient transported with:   [employer]

## 2017-02-10 NOTE — Progress Notes (Signed)
Problem: Self Care Deficits Care Plan (Adult)  Goal: *Acute Goals and Plan of Care (Insert Text)  Initial Occupational Therapy Goals (02/10/2017) Within 7 day(s):    1. Patient will perform grooming standing sinkside with set up/S for increased independence in ADLs.  2. Patient will perform UB dressing with set up/S seated EOB for increased independence with ADLs.  3. Patient will perform LB dressing with no more than min A & A/E PRN for increased independence with ADLs.  4. Patient will perform all aspects of toileting with S for increased independence with ADLs.  5. Patient will perform LB ADLs utilizing body mechanics & adaptive strategies with 1 verbal cue for increased safety in ADLs.  6. Patient will independently apply energy conservation techniques for increased independence with ADLs.    Outcome: Resolved/Met Date Met: 02/10/17  Occupational Therapy EVALUATION/discharge    Patient: Molly [name] (61 y.o. female)  Date: 02/10/2017  Primary Diagnosis: RIGHT HIP OSTEOARTHRITIS  Osteoarthritis of right hip  Procedure(s) (LRB):  RIGHT TOTAL HIP REPLACEMENT ANTERIOR APPROACH WITH C-ARM (Right) 1 Day Post-Op   Precautions:   Fall, WBAT    ASSESSMENT AND RECOMMENDATIONS:  Based on the objective data described below, the patient presents with decreased independence with LB ADL completion and decreased independece with functional mobility following R THA AA.  Pt received OOB and seated in chair when OT arrived to room.  Pt is functioning at SBA/Min A level overall for completion of LB ADL tasks implementing adaptive strategies discussed during session and with use of AE prn following training due to decreased LB reach.  Pt is able to perform functional transfers, including on/off toilet at S level using RW.  Pt tolerated standing at sink to perform grooming task with set up/S.  Reviewed home safety, to include shower transfers for bathing when appropriate per d/c instructions and pt  verbalized understanding.  Pt has a RW, raised toilet with grab bars next to toilet and a reacher at home.  Issued pt a LH shoehorn and sockaid.  No further acute care OT/ADL concerns or questions at this time.  Pt is planning to go to rehab at d/c.      Discharge Recommendations: pt planning to go to SNF  Further Equipment Recommendations for Discharge: RW     SUBJECTIVE:   Patient stated "It feels good to be able to stand up straight."    OBJECTIVE DATA SUMMARY:     Past Medical History:   Diagnosis Date   ??? Adverse effect of anesthesia     hard to awaken   ??? Arthritis    ??? Asthma    ??? Bronchitis    ??? Constipation    ??? Diabetes ([place]) 2010   ??? Hyperlipidemia    ??? Hypertension 1991   ??? Osteoarthritis of right hip 02/04/2017   ??? Palpitations    ??? Pneumonia    ??? Sleep apnea     mouthpiece   ??? Thyroid disease     nodules   ??? Urinary urgency      Past Surgical History:   Procedure Laterality Date   ??? ABDOMEN SURGERY PROC UNLISTED     ??? HX CHOLECYSTECTOMY     ??? HX GI      abd laparoscopy   ??? HX HYSTERECTOMY     ??? HX ORTHOPAEDIC      ctr right     Barriers to Learning/Limitations: None  Compensate with: visual, verbal, tactile, kinesthetic cues/model    Prior Level  of Function/Home Situation:   Home Situation  Home Environment: Private residence  # Steps to Enter: 5  Rails to Enter: Yes  Science writer : Bilateral  One/Two Story Residence: One story  Living Alone: No  Support Systems: Copy, Family member(s)  Patient Expects to be Discharged to:: Rehabilitation facility  Current DME Used/Available at Home: Cane, straight, Environmental consultant, rolling  Tub or Shower Type: Shower  [x]      Right hand dominant   []      Left hand dominant    Cognitive/Behavioral Status:  Neurologic State: Alert;Appropriate for age  Orientation Level: Appropriate for age;Oriented X4  Cognition: Appropriate decision making;Appropriate for age attention/concentration;Appropriate safety awareness;Follows commands   Safety/Judgement: Awareness of environment    Skin: dressing intact R hip incision  Coordination:  Coordination: Within functional limits  Balance:  Sitting: Intact  Standing: Intact;With support  Strength:  Strength: Within functional limits B UE  Tone & Sensation:  Tone: Normal  Sensation: Intact  Range of Motion:  AROM: Within functional limits B UE  Functional Mobility and Transfers for ADLs:  Transfers:  Sit to Stand: Supervision   Toilet Transfer : Supervision  ADL Assessment:  Feeding: Independent    Oral Facial Hygiene/Grooming: Setup    Bathing: Minimum assistance    Upper Body Dressing: Setup    Lower Body Dressing: Minimum assistance    Toileting: Supervision    ADL Intervention:  Grooming  Brushing Teeth: Supervision/set-up    Upper Body Dressing Assistance  Bra: Supervision/set-up  Pullover Shirt: Supervision/set-up    Lower Body Dressing Assistance  Pants With Elastic Waist: Stand-by assistance;Minimum assistance  Socks: Stand-by assistance  Position Performed: Seated in chair  Adaptive Equipment Used: Long handled shoe horn;Sock aid    Toileting  Toileting Assistance: Supervision/set up  Bladder Hygiene: Independent  Clothing Management: Supervision/set-up  Pain:  Pre-treatment: 4/10 R hip  Post-treatment: 4/10 R hip  Activity Tolerance:   Good  Please refer to the flowsheet for vital signs taken during this treatment.  After treatment:   [x]   Patient left in no apparent distress sitting up in chair  []   Patient left in no apparent distress in bed  [x]   Call bell left within reach  []   Nursing notified  []   Caregiver present  []   Bed alarm activated    COMMUNICATION/EDUCATION:   Communication/Collaboration:  [x]       Home safety education was provided and the patient/caregiver indicated understanding.  [x]       Patient/family have participated as able and agree with findings and recommendations.  []       Patient is unable to participate in plan of care at this time.    Thank you for this referral.   Nelle Don, OTR/L  Time Calculation: 30 mins    Eval Complexity: History: LOW Complexity : Brief history review ;   Examination: LOW Complexity : 1-3 performance deficits relating to physical, cognitive , or psychosocial skils that result in activity limitations and / or participation restrictions ;   Decision Making:MEDIUM Complexity : Patient may present with comorbidities that affect occupational performnce. Miniml to moderate modification of tasks or assistance (eg, physical or verbal ) with assesment(s) is necessary to enable patient to complete evaluation

## 2017-02-10 NOTE — Progress Notes (Addendum)
[id number] - Patient in chair at this time. A/O x 4. IV to right forearm  intact and patent.  TEDS and SCDS to bilateral lower legs.  Mepilex  dressing to right hip CDI. Patient denies numbness/tingling. Pedal and radial pulses palpable. Lungs clear. Bowel sounds active to all quadrants. Patient able to get to 1300 on the incentive spirometer. Pain 4/10. Whiteboard updated.   0915 - Pain 4/10.  PRN 5mg  pain medication administered at this time. Patient has been educated on side effects and verbalized 4 side effects in repeatback demonstration..  1020-Paged PA Vania ReaSean Hindman to ask if he wants pt to take her BP meds-BP 122/63.  1036-PA Sean Hindman called back. Per Gregary SignsSean, hold procradia but administer coreg and micardis.  1240 - Pain 4/10.  Scheduled tylenol 650 mg and scheduled 15 mg toradol pain medication administered at this time. Patient has been educated on side effects. Pt refuses narcotic medications at this time.  1253-[facility] to [employer]'' Us[employer]ce Seher LPN.  1315-Called for medical transport, arranged at 2;30pm.  1410-Pt. Had flu shot ordered but when ths nurse brought it in before d/c, patient denied wanting it and refused flu shot. Flu shot assessment recorded.   1415 - Pain 4/10.  PRN 5 mg roxicodone pain medication administered at this time. Patient has been educated on side effects.

## 2017-02-10 NOTE — Progress Notes (Signed)
Problem: Falls - Risk of  Goal: *Absence of Falls  Document Schmid Fall Risk and appropriate interventions in the flowsheet.   Outcome: Progressing Towards Goal  Fall Risk Interventions:  Mobility Interventions: Communicate number of staff needed for ambulation/transfer, Patient to call before getting OOB, Utilize walker, cane, or other assitive device         Medication Interventions: Patient to call before getting OOB, Teach patient to arise slowly    Elimination Interventions: Call light in reach, Patient to call for help with toileting needs

## 2017-02-10 NOTE — Progress Notes (Signed)
Patient was visited by Spiritual Care volunteer Donzella Maupin. Volunteer conducted a Spiritual Care Screening and reported no needs to this chaplain. Spiritual Care literature was provided. Chaplains will continue to follow and will provide pastoral care as needed or requested.    Chaplain Barbara Shefelton, M.Div.  Board Certified Chaplain  [phone] - Office

## 2017-02-10 NOTE — Progress Notes (Signed)
Progress Note        Patient: Molly [name] MRN: [id number]  SSN: [id number]    Date of Birth: 23-Apr-1956  Age: 61 y.o.  Sex: female      1 Day Post-Op status post Procedure(s) (LRB):  RIGHT TOTAL HIP REPLACEMENT ANTERIOR APPROACH WITH C-ARM (Right)    Admit Date: 02/09/2017  Admit Diagnosis: RIGHT HIP OSTEOARTHRITIS  Osteoarthritis of right hip    Subjective:      Doing well.  No complaints.  No SOB.  No Chest Pain.  No Nausea or Vomiting.  No problems eating or voiding.    Objective:        Temp (24hrs), Avg:97.8 ??F (36.6 ??C), Min:97 ??F (36.1 ??C), Max:98.5 ??F (36.9 ??C)    Body mass index is 39.06 kg/(m^2).  Patient Vitals for the past 12 hrs:   BP Temp Pulse Resp SpO2   02/10/17 0653 122/63 98.5 ??F (36.9 ??C) 69 17 100 %   02/10/17 0402 126/48 98.2 ??F (36.8 ??C) 76 16 99 %   02/09/17 2336 143/66 97.9 ??F (36.6 ??C) 71 15 100 %   02/09/17 2044 159/79 98.2 ??F (36.8 ??C) 73 16 99 %     Recent Labs      02/10/17   0456   NA  141   K  3.5   CL  105   CO2  30   BUN  19*   CREA  1.03   GLU  132*       Physical Exam:  Vital Signs are Stable.   No Acute Distress.    Alert and Oriented.    Negative Homans??? sign.    Toes AROM Full.    Neurovascular exam is normal.    Dressing is Clean, Dry, and Intact.    Assessment/Plan:     Stable s/p thr  oob with rehab  1. D/c planning    Continue PT/OT  Discharge Plan: SNF    Signed By: Derryl HarborAnthony T Gina Leblond, MD     February 10, 2017

## 2017-02-11 DIAGNOSIS — Z7409 Other reduced mobility: Secondary | ICD-10-CM | POA: Insufficient documentation

## 2017-02-11 DIAGNOSIS — Z9181 History of falling: Secondary | ICD-10-CM | POA: Insufficient documentation

## 2017-02-11 DIAGNOSIS — J4530 Mild persistent asthma, uncomplicated: Secondary | ICD-10-CM | POA: Insufficient documentation

## 2017-02-20 DIAGNOSIS — Z96641 Presence of right artificial hip joint: Secondary | ICD-10-CM | POA: Insufficient documentation

## 2017-03-17 DIAGNOSIS — E1122 Type 2 diabetes mellitus with diabetic chronic kidney disease: Secondary | ICD-10-CM | POA: Insufficient documentation

## 2017-03-17 DIAGNOSIS — E118 Type 2 diabetes mellitus with unspecified complications: Secondary | ICD-10-CM | POA: Insufficient documentation

## 2017-03-17 DIAGNOSIS — M159 Polyosteoarthritis, unspecified: Secondary | ICD-10-CM | POA: Insufficient documentation

## 2017-03-17 DIAGNOSIS — D510 Vitamin B12 deficiency anemia due to intrinsic factor deficiency: Secondary | ICD-10-CM | POA: Insufficient documentation

## 2017-05-05 ENCOUNTER — Inpatient Hospital Stay: Admit: 2017-05-05 | Payer: TRICARE (CHAMPUS) | Primary: Family Medicine

## 2017-05-05 DIAGNOSIS — Z01818 Encounter for other preprocedural examination: Secondary | ICD-10-CM

## 2017-05-05 LAB — EKG 12-LEAD
Atrial Rate: 69 {beats}/min
Diagnosis: NORMAL
P Axis: 37 degrees
P-R Interval: 168 ms
Q-T Interval: 406 ms
QRS Duration: 78 ms
QTc Calculation (Bazett): 435 ms
R Axis: 28 degrees
T Axis: 10 degrees
Ventricular Rate: 69 {beats}/min

## 2017-05-05 LAB — EKG, 12 LEAD, INITIAL
Atrial Rate: 69 {beats}/min
Calculated P Axis: 37 degrees
Calculated R Axis: 28 degrees
Calculated T Axis: 10 degrees
Diagnosis: NORMAL
P-R Interval: 168 ms
Q-T Interval: 406 ms
QRS Duration: 78 ms
QTC Calculation (Bezet): 435 ms
Ventricular Rate: 69 {beats}/min

## 2017-05-05 LAB — CBC WITH AUTOMATED DIFF
ABS. BASOPHILS: 0.1 10*3/uL — ABNORMAL HIGH (ref 0.0–0.06)
ABS. EOSINOPHILS: 0.1 10*3/uL (ref 0.0–0.4)
ABS. LYMPHOCYTES: 2.7 10*3/uL (ref 0.9–3.6)
ABS. MONOCYTES: 0.4 10*3/uL (ref 0.05–1.2)
ABS. NEUTROPHILS: 2.1 10*3/uL (ref 1.8–8.0)
BASOPHILS: 1 % (ref 0–2)
EOSINOPHILS: 2 % (ref 0–5)
HCT: 38.3 % (ref 35.0–45.0)
HGB: 11.8 g/dL — ABNORMAL LOW (ref 12.0–16.0)
LYMPHOCYTES: 50 % (ref 21–52)
MCH: 24.2 PG (ref 24.0–34.0)
MCHC: 30.8 g/dL — ABNORMAL LOW (ref 31.0–37.0)
MCV: 78.5 FL (ref 74.0–97.0)
MONOCYTES: 8 % (ref 3–10)
MPV: 11.6 FL (ref 9.2–11.8)
NEUTROPHILS: 39 % — ABNORMAL LOW (ref 40–73)
PLATELET: 215 10*3/uL (ref 135–420)
RBC: 4.88 M/uL (ref 4.20–5.30)
RDW: 16.6 % — ABNORMAL HIGH (ref 11.6–14.5)
WBC: 5.4 10*3/uL (ref 4.6–13.2)

## 2017-05-05 LAB — MRSA SCREEN - PCR (NASAL)

## 2017-05-05 LAB — URINALYSIS W/ RFLX MICROSCOPIC
Bilirubin: NEGATIVE
Blood: NEGATIVE
Glucose: NEGATIVE mg/dL
Ketone: NEGATIVE mg/dL
Leukocyte Esterase: NEGATIVE
Nitrites: NEGATIVE
Protein: NEGATIVE mg/dL
Specific gravity: 1.018 (ref 1.005–1.030)
Urobilinogen: 0.2 EU/dL (ref 0.2–1.0)
pH (UA): 7.5 (ref 5.0–8.0)

## 2017-05-05 LAB — METABOLIC PANEL, COMPREHENSIVE
A-G Ratio: 0.9 (ref 0.8–1.7)
ALT (SGPT): 22 U/L (ref 13–56)
AST (SGOT): 18 U/L (ref 15–37)
Albumin: 3.8 g/dL (ref 3.4–5.0)
Alk. phosphatase: 121 U/L — ABNORMAL HIGH (ref 45–117)
Anion gap: 6 mmol/L (ref 3.0–18)
BUN/Creatinine ratio: 25 — ABNORMAL HIGH (ref 12–20)
BUN: 23 MG/DL — ABNORMAL HIGH (ref 7.0–18)
Bilirubin, total: 0.3 MG/DL (ref 0.2–1.0)
CO2: 32 mmol/L (ref 21–32)
Calcium: 9.4 MG/DL (ref 8.5–10.1)
Chloride: 104 mmol/L (ref 100–108)
Creatinine: 0.93 MG/DL (ref 0.6–1.3)
GFR est AA: >60 mL/min/{1.73_m2} (ref >60)
GFR est non-AA: >60 mL/min/{1.73_m2} (ref >60)
Globulin: 4.3 g/dL — ABNORMAL HIGH (ref 2.0–4.0)
Glucose: 102 mg/dL — ABNORMAL HIGH (ref 74–99)
Potassium: 3.9 mmol/L (ref 3.5–5.5)
Protein, total: 8.1 g/dL (ref 6.4–8.2)
Sodium: 142 mmol/L (ref 136–145)

## 2017-05-05 LAB — SED RATE (ESR): Sed rate, automated: 1 mm/hr (ref 0–30)

## 2017-05-05 LAB — PROTHROMBIN TIME + INR
INR: 1 (ref 0.8–1.2)
Prothrombin time: 12.5 s (ref 11.5–15.2)

## 2017-05-05 LAB — HEMOGLOBIN A1C WITH EAG
Est. average glucose: 128 mg/dL
Hemoglobin A1c: 6.1 % — ABNORMAL HIGH (ref 4.5–5.6)

## 2017-05-05 LAB — PTT: aPTT: 32.1 s (ref 23.0–36.4)

## 2017-05-07 LAB — MIH FAX RESULT: FAX TO NUMBER: 3365382394

## 2017-05-12 NOTE — Other (Addendum)
Pt. Denies personal or family history of problems with anesthesia other than pt sometimes has a hard time waking up. Pt. Has sleep apnea. Uses CPAP. Will bring to hospital.Pt is independent. Medical clearance received, scanned and under media tab. Spec Pop done.

## 2017-05-13 ENCOUNTER — Inpatient Hospital Stay: Payer: TRICARE (CHAMPUS) | Primary: Family Medicine

## 2017-05-19 ENCOUNTER — Inpatient Hospital Stay: Admission: RE | Admit: 2017-05-19 | Payer: Self-pay | Source: Ambulatory Visit

## 2017-05-28 ENCOUNTER — Encounter: Payer: Self-pay | Admitting: *Deleted

## 2017-05-28 ENCOUNTER — Encounter
Admission: RE | Admit: 2017-05-28 | Discharge: 2017-05-28 | Disposition: A | Discharge status: Home / Self Care | Source: Ambulatory Visit | Attending: Orthopedic Surgery | Admitting: Orthopedic Surgery

## 2017-05-28 DIAGNOSIS — E042 Nontoxic multinodular goiter: Secondary | ICD-10-CM | POA: Diagnosis not present

## 2017-05-28 DIAGNOSIS — J45909 Unspecified asthma, uncomplicated: Secondary | ICD-10-CM | POA: Diagnosis not present

## 2017-05-28 DIAGNOSIS — Z01812 Encounter for preprocedural laboratory examination: Secondary | ICD-10-CM | POA: Insufficient documentation

## 2017-05-28 DIAGNOSIS — G4730 Sleep apnea, unspecified: Secondary | ICD-10-CM | POA: Insufficient documentation

## 2017-05-28 DIAGNOSIS — I10 Essential (primary) hypertension: Secondary | ICD-10-CM | POA: Insufficient documentation

## 2017-05-28 DIAGNOSIS — E119 Type 2 diabetes mellitus without complications: Secondary | ICD-10-CM | POA: Insufficient documentation

## 2017-05-28 DIAGNOSIS — D649 Anemia, unspecified: Secondary | ICD-10-CM | POA: Insufficient documentation

## 2017-05-28 HISTORY — DX: Anemia, unspecified: D64.9

## 2017-05-28 HISTORY — DX: Sleep apnea, unspecified: G47.30

## 2017-05-28 HISTORY — DX: Type 2 diabetes mellitus without complications: E11.9

## 2017-05-28 HISTORY — DX: Unspecified osteoarthritis, unspecified site: M19.90

## 2017-05-28 HISTORY — DX: Essential (primary) hypertension: I10

## 2017-05-28 HISTORY — DX: Unspecified asthma, uncomplicated: J45.909

## 2017-05-28 HISTORY — DX: Nontoxic multinodular goiter: E04.2

## 2017-05-28 HISTORY — DX: Cardiac murmur, unspecified: R01.1

## 2017-05-28 LAB — URINALYSIS, COMPLETE (UACMP) WITH MICROSCOPIC
Bacteria, UA: NONE SEEN
Bilirubin Urine: NEGATIVE
Glucose, UA: NEGATIVE mg/dL
HGB URINE DIPSTICK: NEGATIVE
Ketones, ur: NEGATIVE mg/dL
Leukocytes, UA: NEGATIVE
Nitrite: NEGATIVE
PROTEIN: NEGATIVE mg/dL
RBC / HPF: NONE SEEN RBC/hpf (ref 0–5)
SQUAMOUS EPITHELIAL / LPF: NONE SEEN
Specific Gravity, Urine: 1.013 (ref 1.005–1.030)
pH: 7 (ref 5.0–8.0)

## 2017-05-28 LAB — TYPE AND SCREEN
ABO/RH(D): O POS
Antibody Screen: NEGATIVE

## 2017-05-28 NOTE — Patient Instructions (Signed)
Your procedure is scheduled FK:CLEX 31, 2018 (TUESDAY)  Report to Same Day Surgery 2nd floor medical mall [facility] Entrance-take elevator on left to 2nd floor.  Check in with surgery information desk.) To find out your arrival time please call [phone] between 1PM - 3PM on June 22, [address] [facility])   Remember: Instructions that are not followed completely may result in serious medical risk, up to and including death, or upon the discretion of your surgeon and anesthesiologist your surgery may need to be rescheduled.    _x___ 1. Do not eat food or drink liquids after midnight. No gum chewing or  hard candies                             __x__ 2. No Alcohol for 24 hours before or after surgery.   __x__3. No Smoking for 24 prior to surgery.   ____  4. Bring all medications with you on the day of surgery if instructed.    __x__ 5. Notify your doctor if there is any change in your medical condition     (cold, fever, infections).     Do not wear jewelry, make-up, hairpins, clips or nail polish.  Do not wear lotions, powders, or perfumes.  Do not shave 48 hours prior to surgery. Men may shave face and neck.  Do not bring valuables to the hospital.    [facility] is not responsible for any belongings or valuables.               Contacts, dentures or bridgework may not be worn into surgery.  Leave your suitcase in the car. After surgery it may be brought to your room.  For patients admitted to the hospital, discharge time is determined by your treatment team                        Patients discharged the day of surgery will not be allowed to drive home.  You will need someone to drive you home and stay with you the night of your procedure.    Please read over the following fact sheets that you were given:   [facility] Preparing for [facility] and or MRSA Information   _x___ Take the following medications the morning of surgery with a sip of water :  1.  NIFEDIPINE  2.Marland KitchenCOREG  3.MAGNESIUM OXIDE.    ____Fleets enema or Magnesium Citrate as directed.   _x___ Use CHG Soap or sage wipes as directed on instruction sheet   _x___ Use inhalers on the day of surgery and bring to hospital day of surgery (USE ALBUTEROL AND Radium )  _X___ Stop Metformin and Janumet 2 days prior to surgery. ( STOP METFORMIN ON JULY 29 )    ____ Take 1/2 of usual insulin dose the night before surgery and none on the morning surgery       _x___ Follow recommendations from Cardiologist, Pulmonologist or PCP regarding          stopping Aspirin, Coumadin, Pllavix ,Eliquis, Effient, or Pradaxa, and Pletal.  X____Stop Anti-inflammatories such as Advil, Aleve, Ibuprofen, Motrin, Naproxen, Naprosyn, Goodies powders or aspirin products. (STOP MELOXICAM  ONE WEEK PRIOR TO SUGERY )OK to take Tylenol   _x___ Stop supplements until after surgery.  But may continue Vitamin D, Vitamin B,and multivitamin . (STOP VITAMIN C NOW )  _x___ Bring C-Pap to the hospital.

## 2017-05-29 LAB — URINE CULTURE: CULTURE: NO GROWTH

## 2017-06-22 MED ORDER — CEFAZOLIN SODIUM-DEXTROSE 2-4 GM/100ML-% IV SOLN
2.0000 g | Freq: Once | INTRAVENOUS | Status: AC
  Administered 2017-06-23: 2 g via INTRAVENOUS

## 2017-06-23 ENCOUNTER — Inpatient Hospital Stay

## 2017-06-23 ENCOUNTER — Inpatient Hospital Stay: Admitting: Anesthesiology

## 2017-06-23 ENCOUNTER — Encounter: Payer: Self-pay | Admitting: *Deleted

## 2017-06-23 ENCOUNTER — Inpatient Hospital Stay
Admission: RE | Admit: 2017-06-23 | Discharge: 2017-06-26 | DRG: 470 | Disposition: A | Discharge status: Skilled Nursing Facility | Source: Ambulatory Visit | Attending: Orthopedic Surgery | Admitting: Orthopedic Surgery

## 2017-06-23 ENCOUNTER — Encounter
Admission: RE | Disposition: A | Discharge status: Skilled Nursing Facility | Payer: Self-pay | Source: Ambulatory Visit | Attending: Orthopedic Surgery

## 2017-06-23 DIAGNOSIS — E042 Nontoxic multinodular goiter: Secondary | ICD-10-CM | POA: Diagnosis present

## 2017-06-23 DIAGNOSIS — G4730 Sleep apnea, unspecified: Secondary | ICD-10-CM | POA: Diagnosis present

## 2017-06-23 DIAGNOSIS — Z79899 Other long term (current) drug therapy: Secondary | ICD-10-CM

## 2017-06-23 DIAGNOSIS — J45909 Unspecified asthma, uncomplicated: Secondary | ICD-10-CM | POA: Diagnosis present

## 2017-06-23 DIAGNOSIS — E119 Type 2 diabetes mellitus without complications: Secondary | ICD-10-CM | POA: Diagnosis present

## 2017-06-23 DIAGNOSIS — D649 Anemia, unspecified: Secondary | ICD-10-CM | POA: Diagnosis present

## 2017-06-23 DIAGNOSIS — I10 Essential (primary) hypertension: Secondary | ICD-10-CM | POA: Diagnosis present

## 2017-06-23 DIAGNOSIS — Z419 Encounter for procedure for purposes other than remedying health state, unspecified: Secondary | ICD-10-CM

## 2017-06-23 DIAGNOSIS — M1612 Unilateral primary osteoarthritis, left hip: Secondary | ICD-10-CM | POA: Diagnosis present

## 2017-06-23 DIAGNOSIS — G8918 Other acute postprocedural pain: Secondary | ICD-10-CM

## 2017-06-23 HISTORY — PX: TOTAL HIP ARTHROPLASTY: SHX124

## 2017-06-23 LAB — GLUCOSE, CAPILLARY
GLUCOSE-CAPILLARY: 131 mg/dL — AB (ref 65–99)
GLUCOSE-CAPILLARY: 110 mg/dL — AB (ref 65–99)
Glucose-Capillary: 117 mg/dL — ABNORMAL HIGH (ref 65–99)

## 2017-06-23 LAB — TYPE AND SCREEN
ABO/RH(D): O POS
Antibody Screen: NEGATIVE

## 2017-06-23 LAB — CREATININE, SERUM
CREATININE: 0.83 mg/dL (ref 0.44–1.00)
GFR calc Af Amer: >60 mL/min (ref >60)

## 2017-06-23 LAB — CBC
HEMATOCRIT: 36.4 % (ref 35.0–47.0)
HEMOGLOBIN: 11.3 g/dL — AB (ref 12.0–16.0)
MCH: 24 pg — ABNORMAL LOW (ref 26.0–34.0)
MCHC: 31.1 g/dL — ABNORMAL LOW (ref 32.0–36.0)
MCV: 77.1 fL — ABNORMAL LOW (ref 80.0–100.0)
Platelets: 150 10*3/uL (ref 150–440)
RBC: 4.72 MIL/uL (ref 3.80–5.20)
RDW: 17.5 % — ABNORMAL HIGH (ref 11.5–14.5)
WBC: 6.3 10*3/uL (ref 3.6–11.0)

## 2017-06-23 SURGERY — ARTHROPLASTY, HIP, TOTAL, ANTERIOR APPROACH
Anesthesia: Spinal | Site: Hip | Laterality: Left | Wound class: Clean

## 2017-06-23 MED ORDER — VITAMIN B-6 50 MG PO TABS
50.0000 mg | ORAL_TABLET | Freq: Every day | ORAL | Status: DC
  Administered 2017-06-24 – 2017-06-26 (×3): 50 mg via ORAL
  Filled 2017-06-23 (×4): qty 1

## 2017-06-23 MED ORDER — SODIUM CHLORIDE 0.9 % IV SOLN
INTRAVENOUS | Status: DC
  Administered 2017-06-23 (×2): via INTRAVENOUS

## 2017-06-23 MED ORDER — VITAMIN D 1000 UNITS PO TABS
1000.0000 [IU] | ORAL_TABLET | Freq: Every day | ORAL | Status: DC
  Administered 2017-06-24 – 2017-06-26 (×3): 1000 [IU] via ORAL
  Filled 2017-06-23 (×3): qty 1

## 2017-06-23 MED ORDER — METFORMIN HCL 500 MG PO TABS
500.0000 mg | ORAL_TABLET | Freq: Every day | ORAL | Status: DC
  Administered 2017-06-23 – 2017-06-26 (×4): 500 mg via ORAL
  Filled 2017-06-23 (×4): qty 1

## 2017-06-23 MED ORDER — ENOXAPARIN SODIUM 40 MG/0.4ML ~~LOC~~ SOLN
40.0000 mg | SUBCUTANEOUS | Status: DC
  Administered 2017-06-24 – 2017-06-26 (×3): 40 mg via SUBCUTANEOUS
  Filled 2017-06-23 (×3): qty 0.4

## 2017-06-23 MED ORDER — LORATADINE 10 MG PO TABS: 10.0000 mg | ORAL_TABLET | Freq: Every day | ORAL | Status: DC | PRN

## 2017-06-23 MED ORDER — FENTANYL CITRATE (PF) 100 MCG/2ML IJ SOLN
25.0000 ug | INTRAMUSCULAR | Status: AC | PRN
  Administered 2017-06-23 (×6): 25 ug via INTRAVENOUS

## 2017-06-23 MED ORDER — FENTANYL CITRATE (PF) 100 MCG/2ML IJ SOLN
INTRAMUSCULAR | Status: AC
  Administered 2017-06-23: 25 ug via INTRAVENOUS
  Filled 2017-06-23: qty 2

## 2017-06-23 MED ORDER — ONDANSETRON HCL 4 MG/2ML IJ SOLN: 4.0000 mg | Freq: Four times a day (QID) | INTRAMUSCULAR | Status: DC | PRN

## 2017-06-23 MED ORDER — ACETAMINOPHEN 650 MG RE SUPP: 650.0000 mg | Freq: Four times a day (QID) | RECTAL | Status: DC | PRN

## 2017-06-23 MED ORDER — PROPOFOL 500 MG/50ML IV EMUL
INTRAVENOUS | Status: AC
  Filled 2017-06-23: qty 50

## 2017-06-23 MED ORDER — ALBUTEROL SULFATE HFA 108 (90 BASE) MCG/ACT IN AERS: 2.0000 | INHALATION_SPRAY | Freq: Four times a day (QID) | RESPIRATORY_TRACT | Status: DC | PRN

## 2017-06-23 MED ORDER — NEOMYCIN-POLYMYXIN B GU 40-200000 IR SOLN
Status: DC | PRN
  Administered 2017-06-23: 4 mL

## 2017-06-23 MED ORDER — BUPIVACAINE HCL (PF) 0.5 % IJ SOLN
INTRAMUSCULAR | Status: DC | PRN
  Administered 2017-06-23: 3 mL

## 2017-06-23 MED ORDER — EPINEPHRINE PF 1 MG/ML IJ SOLN
INTRAMUSCULAR | Status: AC
  Filled 2017-06-23: qty 1

## 2017-06-23 MED ORDER — HYDROMORPHONE HCL 1 MG/ML IJ SOLN: 0.5000 mg | INTRAMUSCULAR | Status: DC | PRN

## 2017-06-23 MED ORDER — CEFAZOLIN SODIUM-DEXTROSE 2-4 GM/100ML-% IV SOLN
2.0000 g | Freq: Four times a day (QID) | INTRAVENOUS | Status: AC
  Administered 2017-06-23 (×3): 2 g via INTRAVENOUS
  Filled 2017-06-23 (×3): qty 100

## 2017-06-23 MED ORDER — DOCUSATE SODIUM 100 MG PO CAPS
100.0000 mg | ORAL_CAPSULE | Freq: Two times a day (BID) | ORAL | Status: DC
  Administered 2017-06-23 – 2017-06-26 (×7): 100 mg via ORAL
  Filled 2017-06-23 (×7): qty 1

## 2017-06-23 MED ORDER — BUPIVACAINE-EPINEPHRINE (PF) 0.25% -1:200000 IJ SOLN
INTRAMUSCULAR | Status: DC | PRN
  Administered 2017-06-23: 30 mL via PERINEURAL

## 2017-06-23 MED ORDER — ONDANSETRON HCL 4 MG PO TABS: 4.0000 mg | ORAL_TABLET | Freq: Four times a day (QID) | ORAL | Status: DC | PRN

## 2017-06-23 MED ORDER — DARIFENACIN HYDROBROMIDE ER 7.5 MG PO TB24
7.5000 mg | ORAL_TABLET | Freq: Every day | ORAL | Status: DC
  Administered 2017-06-23 – 2017-06-26 (×4): 7.5 mg via ORAL
  Filled 2017-06-23 (×4): qty 1

## 2017-06-23 MED ORDER — HYDROCHLOROTHIAZIDE 25 MG PO TABS
25.0000 mg | ORAL_TABLET | Freq: Every day | ORAL | Status: DC
  Administered 2017-06-23 – 2017-06-26 (×2): 25 mg via ORAL
  Filled 2017-06-23 (×2): qty 1

## 2017-06-23 MED ORDER — FAMOTIDINE 20 MG PO TABS
20.0000 mg | ORAL_TABLET | Freq: Once | ORAL | Status: AC
  Administered 2017-06-23: 20 mg via ORAL

## 2017-06-23 MED ORDER — MAGNESIUM CITRATE PO SOLN
1.0000 | Freq: Once | ORAL | Status: DC | PRN
  Filled 2017-06-23: qty 296

## 2017-06-23 MED ORDER — TELMISARTAN-HCTZ 80-25 MG PO TABS: 1.0000 | ORAL_TABLET | Freq: Every day | ORAL | Status: DC

## 2017-06-23 MED ORDER — DEXTROSE 5 % IV SOLN
500.0000 mg | Freq: Four times a day (QID) | INTRAVENOUS | Status: DC | PRN
  Filled 2017-06-23: qty 5

## 2017-06-23 MED ORDER — ALBUTEROL SULFATE (2.5 MG/3ML) 0.083% IN NEBU: 2.5000 mg | INHALATION_SOLUTION | Freq: Four times a day (QID) | RESPIRATORY_TRACT | Status: DC | PRN

## 2017-06-23 MED ORDER — NIFEDIPINE ER 30 MG PO TB24
30.0000 mg | ORAL_TABLET | Freq: Every day | ORAL | Status: DC
  Administered 2017-06-25 – 2017-06-26 (×2): 30 mg via ORAL
  Filled 2017-06-23 (×3): qty 1

## 2017-06-23 MED ORDER — MORPHINE SULFATE (PF) 2 MG/ML IV SOLN
2.0000 mg | INTRAVENOUS | Status: DC | PRN
  Administered 2017-06-23 (×2): 2 mg via INTRAVENOUS
  Filled 2017-06-23 (×2): qty 1

## 2017-06-23 MED ORDER — MIDAZOLAM HCL 2 MG/2ML IJ SOLN
INTRAMUSCULAR | Status: AC
Start: 2017-06-23 — End: 2017-06-23
  Filled 2017-06-23: qty 2

## 2017-06-23 MED ORDER — ACETAMINOPHEN 325 MG PO TABS
650.0000 mg | ORAL_TABLET | Freq: Four times a day (QID) | ORAL | Status: DC | PRN
  Administered 2017-06-25: 650 mg via ORAL
  Filled 2017-06-23: qty 2

## 2017-06-23 MED ORDER — NEOMYCIN-POLYMYXIN B GU 40-200000 IR SOLN
Status: AC
  Filled 2017-06-23: qty 4

## 2017-06-23 MED ORDER — MAGNESIUM HYDROXIDE 400 MG/5ML PO SUSP
30.0000 mL | Freq: Every day | ORAL | Status: DC | PRN
  Administered 2017-06-24: 30 mL via ORAL
  Filled 2017-06-23: qty 30

## 2017-06-23 MED ORDER — ALUM & MAG HYDROXIDE-SIMETH 200-200-20 MG/5ML PO SUSP: 30.0000 mL | ORAL | Status: DC | PRN

## 2017-06-23 MED ORDER — SODIUM CHLORIDE 0.9 % IV SOLN
INTRAVENOUS | Status: DC | PRN
  Administered 2017-06-23: 10 ug/min via INTRAVENOUS

## 2017-06-23 MED ORDER — PHENOL 1.4 % MT LIQD
1.0000 | OROMUCOSAL | Status: DC | PRN
  Filled 2017-06-23: qty 177

## 2017-06-23 MED ORDER — GLYCOPYRROLATE 0.2 MG/ML IJ SOLN
INTRAMUSCULAR | Status: AC
  Filled 2017-06-23: qty 1

## 2017-06-23 MED ORDER — METHOCARBAMOL 500 MG PO TABS
500.0000 mg | ORAL_TABLET | Freq: Four times a day (QID) | ORAL | Status: DC | PRN
  Administered 2017-06-23 – 2017-06-24 (×3): 500 mg via ORAL
  Filled 2017-06-23 (×3): qty 1

## 2017-06-23 MED ORDER — BUPIVACAINE HCL (PF) 0.25 % IJ SOLN
INTRAMUSCULAR | Status: AC
  Filled 2017-06-23: qty 30

## 2017-06-23 MED ORDER — TRAMADOL HCL 50 MG PO TABS
100.0000 mg | ORAL_TABLET | Freq: Four times a day (QID) | ORAL | Status: AC
  Administered 2017-06-23: 100 mg via ORAL
  Administered 2017-06-24 – 2017-06-25 (×4): 50 mg via ORAL
  Filled 2017-06-23 (×6): qty 2

## 2017-06-23 MED ORDER — BUPIVACAINE HCL (PF) 0.5 % IJ SOLN
INTRAMUSCULAR | Status: AC
  Filled 2017-06-23: qty 10

## 2017-06-23 MED ORDER — IRBESARTAN 150 MG PO TABS
300.0000 mg | ORAL_TABLET | Freq: Every day | ORAL | Status: DC
  Administered 2017-06-23 – 2017-06-24 (×2): 300 mg via ORAL
  Filled 2017-06-23 (×4): qty 2

## 2017-06-23 MED ORDER — METOCLOPRAMIDE HCL 5 MG/ML IJ SOLN: 5.0000 mg | Freq: Three times a day (TID) | INTRAMUSCULAR | Status: DC | PRN

## 2017-06-23 MED ORDER — CEFAZOLIN SODIUM-DEXTROSE 2-4 GM/100ML-% IV SOLN
INTRAVENOUS | Status: AC
  Filled 2017-06-23: qty 100

## 2017-06-23 MED ORDER — OXYCODONE HCL 5 MG PO TABS
5.0000 mg | ORAL_TABLET | ORAL | Status: DC | PRN
  Administered 2017-06-23: 10 mg via ORAL
  Administered 2017-06-23 – 2017-06-26 (×3): 5 mg via ORAL
  Filled 2017-06-23 (×2): qty 2
  Filled 2017-06-23 (×2): qty 1

## 2017-06-23 MED ORDER — CARVEDILOL 12.5 MG PO TABS
12.5000 mg | ORAL_TABLET | Freq: Two times a day (BID) | ORAL | Status: DC
  Administered 2017-06-23 – 2017-06-26 (×3): 12.5 mg via ORAL
  Filled 2017-06-23 (×3): qty 1

## 2017-06-23 MED ORDER — PROPOFOL 500 MG/50ML IV EMUL
INTRAVENOUS | Status: DC | PRN
  Administered 2017-06-23: 75 ug/kg/min via INTRAVENOUS

## 2017-06-23 MED ORDER — CALCIUM CARBONATE-VITAMIN D 500-200 MG-UNIT PO TABS
1.0000 | ORAL_TABLET | Freq: Every day | ORAL | Status: DC
  Administered 2017-06-24 – 2017-06-26 (×3): 1 via ORAL
  Filled 2017-06-23 (×3): qty 1

## 2017-06-23 MED ORDER — MAGNESIUM OXIDE 400 (241.3 MG) MG PO TABS
400.0000 mg | ORAL_TABLET | Freq: Every day | ORAL | Status: DC
  Administered 2017-06-24 – 2017-06-26 (×3): 400 mg via ORAL
  Filled 2017-06-23 (×3): qty 1

## 2017-06-23 MED ORDER — METOCLOPRAMIDE HCL 10 MG PO TABS: 5.0000 mg | ORAL_TABLET | Freq: Three times a day (TID) | ORAL | Status: DC | PRN

## 2017-06-23 MED ORDER — PROPOFOL 10 MG/ML IV BOLUS
INTRAVENOUS | Status: DC | PRN
  Administered 2017-06-23: 10 mg via INTRAVENOUS
  Administered 2017-06-23 (×2): 20 mg via INTRAVENOUS

## 2017-06-23 MED ORDER — DIPHENHYDRAMINE HCL 12.5 MG/5ML PO ELIX: 12.5000 mg | ORAL_SOLUTION | ORAL | Status: DC | PRN

## 2017-06-23 MED ORDER — MOMETASONE FURO-FORMOTEROL FUM 200-5 MCG/ACT IN AERO
2.0000 | INHALATION_SPRAY | Freq: Two times a day (BID) | RESPIRATORY_TRACT | Status: DC
  Administered 2017-06-23 – 2017-06-25 (×5): 2 via RESPIRATORY_TRACT
  Filled 2017-06-23: qty 8.8

## 2017-06-23 MED ORDER — POLYETHYL GLYCOL-PROPYL GLYCOL 0.4-0.3 % OP GEL
Freq: Every day | OPHTHALMIC | Status: DC | PRN
  Filled 2017-06-23: qty 10

## 2017-06-23 MED ORDER — MENTHOL 3 MG MT LOZG
1.0000 | LOZENGE | OROMUCOSAL | Status: DC | PRN
  Filled 2017-06-23: qty 9

## 2017-06-23 MED ORDER — SODIUM CHLORIDE 0.9 % IV SOLN
INTRAVENOUS | Status: DC | PRN
  Administered 2017-06-23: 1000 mg via INTRAVENOUS

## 2017-06-23 MED ORDER — BISACODYL 10 MG RE SUPP: 10.0000 mg | Freq: Every day | RECTAL | Status: DC | PRN

## 2017-06-23 MED ORDER — GABAPENTIN 300 MG PO CAPS
300.0000 mg | ORAL_CAPSULE | Freq: Three times a day (TID) | ORAL | Status: DC
  Administered 2017-06-23 – 2017-06-25 (×7): 300 mg via ORAL
  Filled 2017-06-23 (×9): qty 1

## 2017-06-23 MED ORDER — VITAMIN C 500 MG PO TABS
1000.0000 mg | ORAL_TABLET | Freq: Every day | ORAL | Status: DC
  Administered 2017-06-24 – 2017-06-25 (×2): 1000 mg via ORAL
  Filled 2017-06-23 (×4): qty 2

## 2017-06-23 MED ORDER — ZOLPIDEM TARTRATE 5 MG PO TABS
5.0000 mg | ORAL_TABLET | Freq: Every evening | ORAL | Status: DC | PRN
  Filled 2017-06-23: qty 1

## 2017-06-23 MED ORDER — TRANEXAMIC ACID 1000 MG/10ML IV SOLN
INTRAVENOUS | Status: AC
  Filled 2017-06-23: qty 10

## 2017-06-23 MED ORDER — ONDANSETRON HCL 4 MG/2ML IJ SOLN: 4.0000 mg | Freq: Once | INTRAMUSCULAR | Status: DC | PRN

## 2017-06-23 MED ORDER — SODIUM CHLORIDE 0.9 % IV SOLN
INTRAVENOUS | Status: DC
  Administered 2017-06-23 (×3): via INTRAVENOUS

## 2017-06-23 MED ORDER — FAMOTIDINE 20 MG PO TABS
ORAL_TABLET | ORAL | Status: AC
  Administered 2017-06-23: 20 mg via ORAL
  Filled 2017-06-23: qty 1

## 2017-06-23 MED ORDER — GLYCOPYRROLATE 0.2 MG/ML IJ SOLN
INTRAMUSCULAR | Status: DC | PRN
  Administered 2017-06-23: 0.2 mg via INTRAVENOUS

## 2017-06-23 MED ORDER — PROPOFOL 10 MG/ML IV BOLUS
INTRAVENOUS | Status: AC
  Filled 2017-06-23: qty 20

## 2017-06-23 SURGICAL SUPPLY — 53 items
BLADE SAW SAG 18.5X105 (BLADE) ×3 IMPLANT
BNDG COHESIVE 6X5 TAN STRL LF (GAUZE/BANDAGES/DRESSINGS) ×9 IMPLANT
CANISTER SUCT 1200ML W/VALVE (MISCELLANEOUS) ×3 IMPLANT
CAPT HIP TOTAL 3 ×3 IMPLANT
CATH FOL LEG HOLDER (MISCELLANEOUS) ×3 IMPLANT
CATH TRAY METER 16FR LF (MISCELLANEOUS) ×3 IMPLANT
CHLORAPREP W/TINT 26ML (MISCELLANEOUS) ×3 IMPLANT
DRAPE C-ARM XRAY 36X54 (DRAPES) ×3 IMPLANT
DRAPE INCISE IOBAN 66X60 STRL (DRAPES) IMPLANT
DRAPE POUCH INSTRU U-SHP 10X18 (DRAPES) ×3 IMPLANT
DRAPE SHEET LG 3/4 BI-LAMINATE (DRAPES) ×9 IMPLANT
DRAPE TABLE BACK 80X90 (DRAPES) ×3 IMPLANT
DRESSING SURGICEL FIBRLLR 1X2 (HEMOSTASIS) ×2 IMPLANT
DRSG OPSITE POSTOP 4X8 (GAUZE/BANDAGES/DRESSINGS) IMPLANT
DRSG SURGICEL FIBRILLAR 1X2 (HEMOSTASIS) ×6
ELECT BLADE 6.5 EXT (BLADE) ×3 IMPLANT
ELECT REM PT RETURN 9FT ADLT (ELECTROSURGICAL) ×3
ELECTRODE REM PT RTRN 9FT ADLT (ELECTROSURGICAL) ×1 IMPLANT
GLOVE BIOGEL PI IND STRL 9 (GLOVE) ×1 IMPLANT
GLOVE BIOGEL PI INDICATOR 9 (GLOVE) ×2
GLOVE SURG SYN 9.0  PF PI (GLOVE) ×4
GLOVE SURG SYN 9.0 PF PI (GLOVE) ×2 IMPLANT
GOWN SRG 2XL LVL 4 RGLN SLV (GOWNS) ×1 IMPLANT
GOWN STRL NON-REIN 2XL LVL4 (GOWNS) ×2
GOWN STRL REUS W/ TWL LRG LVL3 (GOWN DISPOSABLE) ×2 IMPLANT
GOWN STRL REUS W/TWL LRG LVL3 (GOWN DISPOSABLE) ×4
HEMOVAC 400CC 10FR (MISCELLANEOUS) ×3 IMPLANT
HOOD PEEL AWAY FLYTE STAYCOOL (MISCELLANEOUS) ×3 IMPLANT
KIT PREVENA INCISION MGT 13 (CANNISTER) ×3 IMPLANT
MAT BLUE FLOOR 46X72 FLO (MISCELLANEOUS) ×3 IMPLANT
NDL SAFETY 18GX1.5 (NEEDLE) ×3 IMPLANT
NEEDLE FILTER BLUNT 18X 1/2SAF (NEEDLE) ×2
NEEDLE FILTER BLUNT 18X1 1/2 (NEEDLE) ×1 IMPLANT
NEEDLE SPNL 18GX3.5 QUINCKE PK (NEEDLE) ×3 IMPLANT
NS IRRIG 1000ML POUR BTL (IV SOLUTION) ×3 IMPLANT
PACK HIP COMPR (MISCELLANEOUS) ×3 IMPLANT
SOL PREP PVP 2OZ (MISCELLANEOUS) ×3
SOLUTION PREP PVP 2OZ (MISCELLANEOUS) ×1 IMPLANT
SPONGE DRAIN TRACH 4X4 STRL 2S (GAUZE/BANDAGES/DRESSINGS) ×3 IMPLANT
STAPLER SKIN PROX 35W (STAPLE) ×3 IMPLANT
STRAP SAFETY BODY (MISCELLANEOUS) ×3 IMPLANT
SUT DVC 2 QUILL PDO  T11 36X36 (SUTURE) ×2
SUT DVC 2 QUILL PDO T11 36X36 (SUTURE) ×1 IMPLANT
SUT SILK 0 (SUTURE) ×2
SUT SILK 0 30XBRD TIE 6 (SUTURE) ×1 IMPLANT
SUT V-LOC 90 ABS DVC 3-0 CL (SUTURE) ×3 IMPLANT
SUT VIC AB 1 CT1 36 (SUTURE) ×6 IMPLANT
SYR 20CC LL (SYRINGE) ×3 IMPLANT
SYR 30ML LL (SYRINGE) ×3 IMPLANT
SYR TB 1ML 27GX1/2 LL (SYRINGE) ×3 IMPLANT
TAPE MICROFOAM 4IN (TAPE) ×3 IMPLANT
TOWEL OR 17X26 4PK STRL BLUE (TOWEL DISPOSABLE) ×3 IMPLANT
WND VAC CANISTER 500ML (MISCELLANEOUS) ×3 IMPLANT

## 2017-06-23 NOTE — Transfer of Care (Signed)
Immediate Anesthesia Transfer of Care Note  Patient: Christina Mcclain  Procedure(s) Performed: Procedure(s): TOTAL HIP ARTHROPLASTY ANTERIOR APPROACH (Left)  Patient Location: PACU  Anesthesia Type:Spinal  Level of Consciousness: sedated  Airway & Oxygen Therapy: Patient Spontanous Breathing and Patient connected to face mask oxygen  Post-op Assessment: Report given to RN and Post -op Vital signs reviewed and stable  Post vital signs: Reviewed and stable  Last Vitals:  Vitals:   06/23/17 0622  BP: (!) 143/73  Pulse: 71  Resp: 16  Temp: (!) 36.2 C    Last Pain:  Vitals:   06/23/17 0622  TempSrc: Tympanic  PainSc: 6          Complications: No apparent anesthesia complications

## 2017-06-23 NOTE — NC FL2 (Signed)
[place] LEVEL OF CARE SCREENING TOOL     IDENTIFICATION  Patient Name: Christina Mcclain Birthdate: 03/11/1956 Sex: female Admission Date (Current Location): 06/23/2017  [place]:  Engineering geologist and Address:  [facility], [address], [place], [place] [address]      Provider Number: [id number]  Attending Physician Name and Address:  Hessie Knows, MD  Relative Name and Phone Number:       Current Level of Care: Hospital Recommended Level of Care: [place] Prior Approval Number:    Date Approved/Denied:   PASRR Number:    Discharge Plan: SNF    Current Diagnoses: Patient Active Problem List   Diagnosis Date Noted  . Primary localized osteoarthrosis of left hip 06/23/2017    Orientation RESPIRATION BLADDER Height & Weight     Self, Time, Situation, Place  Normal Continent Weight: 214 lb (97.1 kg) Height:  5\' 2"  (157.5 cm)  BEHAVIORAL SYMPTOMS/MOOD NEUROLOGICAL BOWEL NUTRITION STATUS   (none)  (none) Continent Diet (Diet: Clear Liquid to be Advanced. )  AMBULATORY STATUS COMMUNICATION OF NEEDS Skin   Extensive Assist Verbally Surgical wounds, Wound Vac (Incision: Left Hip. Provena wound vac. )                       Personal Care Assistance Level of Assistance  Bathing, Feeding, Dressing Bathing Assistance: Limited assistance Feeding assistance: Independent Dressing Assistance: Limited assistance     Functional Limitations Info  Sight, Hearing, Speech Sight Info: Adequate Hearing Info: Adequate Speech Info: Adequate    SPECIAL CARE FACTORS FREQUENCY  PT (By licensed PT), OT (By licensed OT)     PT Frequency:  (5) OT Frequency:  (5)            Contractures      Additional Factors Info  Code Status, Allergies Code Status Info:  (Full Code. ) Allergies Info:  (Entex T Pseudoephedrine-guaifenesin)           Current Medications (06/23/2017):  This is the  current hospital active medication list Current Facility-Administered Medications  Medication Dose Route Frequency Provider Last Rate Last Dose  . 0.9 %  sodium chloride infusion   Intravenous Continuous Hessie Knows, MD 100 mL/hr at 06/23/17 1102    . acetaminophen (TYLENOL) tablet 650 mg  650 mg Oral Q6H PRN Hessie Knows, MD       Or  . acetaminophen (TYLENOL) suppository 650 mg  650 mg Rectal Q6H PRN Hessie Knows, MD      . albuterol (PROVENTIL) (2.5 MG/3ML) 0.083% nebulizer solution 2.5 mg  2.5 mg Nebulization Q6H PRN Hessie Knows, MD      . alum & mag hydroxide-simeth (MAALOX/MYLANTA) 200-200-20 MG/5ML suspension 30 mL  30 mL Oral Q4H PRN Hessie Knows, MD      . bisacodyl (DULCOLAX) suppository 10 mg  10 mg Rectal Daily PRN Hessie Knows, MD      . calcium-vitamin D (OSCAL WITH D) 500-200 MG-UNIT per tablet 1 tablet  1 tablet Oral Daily Hessie Knows, MD      . carvedilol (COREG) tablet 12.5 mg  12.5 mg Oral BID WC Hessie Knows, MD      . ceFAZolin (ANCEF) IVPB 2g/100 mL premix  2 g Intravenous Q6H Hessie Knows, MD      . cholecalciferol (VITAMIN D) tablet 1,000 Units  1,000 Units Oral Daily Hessie Knows, MD      . darifenacin (ENABLEX) 24 hr tablet  7.5 mg  7.5 mg Oral Daily Hessie Knows, MD      . diphenhydrAMINE (BENADRYL) 12.5 MG/5ML elixir 12.5-25 mg  12.5-25 mg Oral Q4H PRN Hessie Knows, MD      . docusate sodium (COLACE) capsule 100 mg  100 mg Oral BID Hessie Knows, MD      . Derrill Memo ON 06/24/2017] enoxaparin (LOVENOX) injection 40 mg  40 mg Subcutaneous Q24H Hessie Knows, MD      . gabapentin (NEURONTIN) capsule 300 mg  300 mg Oral TID Hessie Knows, MD   300 mg at 06/23/17 1116  . irbesartan (AVAPRO) tablet 300 mg  300 mg Oral Daily Hessie Knows, MD   300 mg at 06/23/17 1116   And  . hydrochlorothiazide (HYDRODIURIL) tablet 25 mg  25 mg Oral Daily Hessie Knows, MD   25 mg at 06/23/17 1116  . loratadine (CLARITIN) tablet 10 mg  10 mg Oral Daily PRN Hessie Knows, MD       . magnesium citrate solution 1 Bottle  1 Bottle Oral Once PRN Hessie Knows, MD      . magnesium hydroxide (MILK OF MAGNESIA) suspension 30 mL  30 mL Oral Daily PRN Hessie Knows, MD      . magnesium oxide (MAG-OX) tablet 400 mg  400 mg Oral Daily Hessie Knows, MD      . menthol-cetylpyridinium (CEPACOL) lozenge 3 mg  1 lozenge Oral PRN Hessie Knows, MD       Or  . phenol (CHLORASEPTIC) mouth spray 1 spray  1 spray Mouth/Throat PRN Hessie Knows, MD      . metFORMIN (GLUCOPHAGE) tablet 500 mg  500 mg Oral Q breakfast Hessie Knows, MD      . methocarbamol (ROBAXIN) tablet 500 mg  500 mg Oral Q6H PRN Hessie Knows, MD       Or  . methocarbamol (ROBAXIN) 500 mg in dextrose 5 % 50 mL IVPB  500 mg Intravenous Q6H PRN Hessie Knows, MD      . metoCLOPramide (REGLAN) tablet 5-10 mg  5-10 mg Oral Q8H PRN Hessie Knows, MD       Or  . metoCLOPramide (REGLAN) injection 5-10 mg  5-10 mg Intravenous Q8H PRN Hessie Knows, MD      . mometasone-formoterol [facility]) 200-5 MCG/ACT inhaler 2 puff  2 puff Inhalation BID Hessie Knows, MD      . morphine 2 MG/ML injection 2 mg  2 mg Intravenous Q2H PRN Hessie Knows, MD   2 mg at 06/23/17 1223  . [START ON 06/24/2017] NIFEdipine (PROCARDIA-XL/ADALAT CC) 24 hr tablet 30 mg  30 mg Oral Daily Hessie Knows, MD      . ondansetron [facility]) tablet 4 mg  4 mg Oral Q6H PRN Hessie Knows, MD       Or  . ondansetron [facility]) injection 4 mg  4 mg Intravenous Q6H PRN Hessie Knows, MD      . oxyCODONE (Oxy IR/ROXICODONE) immediate release tablet 5-10 mg  5-10 mg Oral Q3H PRN Hessie Knows, MD   10 mg at 06/23/17 1116  . polyethylene glycol 0.4% and propylene glycol 0.3% (SYSTANE) ophthalmic gel   Both Eyes Daily PRN Hessie Knows, MD      . pyridOXINE (VITAMIN B-6) tablet 50 mg  50 mg Oral Daily Hessie Knows, MD      . vitamin C (ASCORBIC ACID) tablet 1,000 mg  1,000 mg Oral Daily Hessie Knows, MD      . zolpidem (AMBIEN) tablet 5 mg  5 mg  Oral QHS PRN Hessie Knows, MD          Discharge Medications: Please see discharge summary for a list of discharge medications.  Relevant Imaging Results:  Relevant Lab Results:   Additional Information    Dijon Kohlman, Veronia Beets, LCSW

## 2017-06-23 NOTE — Anesthesia Post-op Follow-up Note (Cosign Needed)
Anesthesia QCDR form completed.        

## 2017-06-23 NOTE — H&P (Signed)
Reviewed paper H+P, will be scanned into chart. Patient examinied No changes noted.

## 2017-06-23 NOTE — Anesthesia Preprocedure Evaluation (Signed)
Anesthesia Evaluation  Patient identified by MRN, date of birth, ID band Patient awake    Reviewed: Allergy & Precautions, NPO status , Patient's Chart, lab work & pertinent test results, reviewed documented beta blocker date and time   Airway Mallampati: III  TM Distance: >3 FB     Dental  (+) Chipped   Pulmonary asthma , sleep apnea and Continuous Positive Airway Pressure Ventilation ,           Cardiovascular hypertension, Pt. on medications      Neuro/Psych    GI/Hepatic   Endo/Other  diabetes, Type 2  Renal/GU      Musculoskeletal  (+) Arthritis ,   Abdominal   Peds  Hematology  (+) anemia ,   Anesthesia Other Findings   Reproductive/Obstetrics                             Anesthesia Physical Anesthesia Plan  ASA: III  Anesthesia Plan: Spinal   Post-op Pain Management:    Induction:   PONV Risk Score and Plan:   Airway Management Planned:   Additional Equipment:   Intra-op Plan:   Post-operative Plan:   Informed Consent: I have reviewed the patients History and Physical, chart, labs and discussed the procedure including the risks, benefits and alternatives for the proposed anesthesia with the patient or authorized representative who has indicated his/her understanding and acceptance.     Plan Discussed with: CRNA  Anesthesia Plan Comments:         Anesthesia Quick Evaluation

## 2017-06-23 NOTE — Progress Notes (Signed)
Admission: Patient alert and oriented. Complaining of severe pain and legs cramps in both legs. Some relief with IV pain meds. Family at bedside. Hemovac and wound vac in tact. Vitals stable.   Deri Fuelling, RN

## 2017-06-23 NOTE — Op Note (Signed)
06/23/2017  9:37 AM  PATIENT:  Christina Mcclain  61 y.o. female  PRE-OPERATIVE DIAGNOSIS:  PRIMARY LOCALIZED OSTEOARTHRITIS OF LEFT HIP  POST-OPERATIVE DIAGNOSIS:  PRIMARY LOCALIZED OSTEOARTHRITIS OF LEFT HIP  PROCEDURE:  Procedure(s): TOTAL HIP ARTHROPLASTY ANTERIOR APPROACH (Left)  SURGEON: Laurene Footman, MD  ASSISTANTS: None  ANESTHESIA:   spinal  EBL:  Total I/O In: 1000 [I.V.:1000] Out: 500 [Blood:500]  BLOOD ADMINISTERED:none  DRAINS: (2) Hemovact drain(s) in the Subcutaneous layer with  Suction Open   LOCAL MEDICATIONS USED:  MARCAINE     SPECIMEN:  Source of Specimen:  Left femoral head  DISPOSITION OF SPECIMEN:  PATHOLOGY  COUNTS:  YES  TOURNIQUET:  * No tourniquets in log *  IMPLANTS: Medacta AMIS standard 2 collared stem,54  Mpact cup DM with liner, and S 28 mm ceramic head  DICTATION: .Dragon Dictation   The patient was brought to the operating room and after spinal anesthesia was obtained patient was placed on the operative table with the ipsilateral foot into the Medacta attachment, contralateral leg on a well-padded table. C-arm was brought in and preop template x-ray taken. After prepping and draping in usual sterile fashion appropriate patient identification and timeout procedures were completed. Anterior approach to the hip was obtained and centered over the greater trochanter and TFL muscle. The subcutaneous tissue was incised hemostasis being achieved by electrocautery. TFL fascia was incised and the muscle retracted laterally deep retractor placed. The lateral femoral circumflex vessels were identified and ligated. The anterior capsule was exposed and a capsulotomy performed. The neck was identified and a femoral neck cut carried out with a saw. The head was removed without difficulty and showed sclerotic femoral head and acetabulum. Reaming was carried out to 52 mm and a 54 mm cup trial gave appropriate tightness to the acetabular component a 54 DM cup  was impacted into position. The leg was then externally rotated and ischiofemoral and pubofemoral releases carried out. The femur was sequentially broached to a size 2, size 2 standard trials were placed and the final components chosen. The 2 collared stem was inserted along with a S ceramic 28 mm head and 54 mm liner. The hip was reduced and was stable the wound was thoroughly irrigated. The deep fascia was closed using a heavy Quill after infiltration of 30 cc of quarter percent Sensorcaine with epinephrine. Subcutaneous drains were then inserted 3-0 v-locto close the skin with skin staples incisional wound VAC applied  PLAN OF CARE: Admit to inpatient

## 2017-06-23 NOTE — Anesthesia Procedure Notes (Signed)
Spinal  Patient location during procedure: OR Start time: 06/23/2017 7:15 AM End time: 06/23/2017 7:21 AM Staffing Resident/CRNA: Nelda Marseille Performed: resident/CRNA  Preanesthetic Checklist Completed: patient identified, site marked, surgical consent, pre-op evaluation, timeout performed, IV checked, risks and benefits discussed and monitors and equipment checked Spinal Block Patient position: sitting Prep: Betadine Patient monitoring: heart rate, continuous pulse ox, blood pressure and cardiac monitor Approach: midline Location: L3-4 Injection technique: single-shot Needle Needle type: Whitacre and Introducer  Needle gauge: 25 G Needle length: 9 cm Assessment Sensory level: T10 Additional Notes Negative paresthesia. Negative blood return. Positive free-flowing CSF. Expiration date of kit checked and confirmed. Patient tolerated procedure well, without complications.

## 2017-06-23 NOTE — Evaluation (Signed)
Physical Therapy Evaluation Patient Details Name: Christina Mcclain MRN: [id number] DOB: Feb 08, 1956 Today's Date: 06/23/2017   History of Present Illness  61 yo female with onset of OA B hips, had R THA in March 2018 and now direct anterior approach L hip.  PMHx:  sleep apnea, thyroid nodules, HTN, heart murmur, DM, asthma, OA, anemia  Clinical Impression  Pt is groggy and had trouble keeping her eyes open but did successfully stand and side step on side of bed.  Her plan is to get home after going to SNF, which is indicated by the amount of assistance she needs to get on and off the bed.  Will see her BID daily to progress and may be able to get directly home if her independence increases, but for now is SNF candidate.    Follow Up Recommendations SNF    Equipment Recommendations  None recommended by PT    Recommendations for Other Services       Precautions / Restrictions Precautions Precautions: Fall (wound vac) Restrictions Weight Bearing Restrictions: Yes LLE Weight Bearing: Weight bearing as tolerated      Mobility  Bed Mobility Overal bed mobility: Needs Assistance Bed Mobility: Supine to Sit;Sit to Supine     Supine to sit: Mod assist Sit to supine: Max assist   General bed mobility comments: 2 person assist with bed pad to shift hips after returning to bed  Transfers Overall transfer level: Needs assistance Equipment used: Rolling walker (2 wheeled);1 person hand held assist Transfers: Sit to/from Stand Sit to Stand: Mod assist         General transfer comment: mod assist to power up and lethargic so used hand on walker to intiate  Ambulation/Gait Ambulation/Gait assistance: Min assist Ambulation Distance (Feet): 5 Feet (x 2) Assistive device: Rolling walker (2 wheeled);1 person hand held assist Gait Pattern/deviations: Step-to pattern;Narrow base of support;Shuffle;Decreased stride length Gait velocity: reduced Gait velocity interpretation: Below  normal speed for age/gender General Gait Details: sidesteps on side of bed x 2 with   Stairs            Wheelchair Mobility    Modified Rankin (Stroke Patients Only)       Balance Overall balance assessment: Needs assistance Sitting-balance support: Feet supported Sitting balance-Leahy Scale: Fair     Standing balance support: Bilateral upper extremity supported Standing balance-Leahy Scale: Poor                               Pertinent Vitals/Pain Pain Assessment: 0-10 Pain Score: 8  Pain Location: L hip Pain Descriptors / Indicators: Operative site guarding Pain Intervention(s): Monitored during session;Premedicated before session;Repositioned;Ice applied;RN gave pain meds during session    [place] expects to be discharged to:: Skilled nursing facility Living Arrangements: Spouse/significant other Available Help at Discharge: Family;Available 24 hours/day Type of Home: House Home Access: Stairs to enter Entrance Stairs-Rails: Chemical engineer of Steps: 3 Home Layout: One level Home Equipment: Walker - 2 wheels;Cane - single point;Shower seat Additional Comments: pt is expecting to go to son's house for HHPT eventually    Prior Function Level of Independence: Independent with assistive device(s)               Hand Dominance        Extremity/Trunk Assessment   Upper Extremity Assessment Upper Extremity Assessment: Overall WFL for tasks assessed    Lower Extremity Assessment Lower Extremity Assessment: LLE deficits/detail LLE  Deficits / Details: new post op THA direct anterior approach LLE: Unable to fully assess due to immobilization LLE Coordination: decreased fine motor;decreased gross motor    Cervical / Trunk Assessment Cervical / Trunk Assessment: Normal  Communication   Communication: No difficulties  Cognition Arousal/Alertness: Lethargic;Suspect due to medications Behavior During  Therapy: Flat affect Overall Cognitive Status: Within Functional Limits for tasks assessed                                        General Comments      Exercises     Assessment/Plan    PT Assessment Patient needs continued PT services  PT Problem List Decreased strength;Decreased range of motion;Decreased activity tolerance;Decreased balance;Decreased mobility;Decreased coordination;Decreased safety awareness;Obesity;Decreased skin integrity;Pain       PT Treatment Interventions DME instruction;Gait training;Stair training;Functional mobility training;Therapeutic activities;Therapeutic exercise;Balance training;Neuromuscular re-education;Patient/family education    PT Goals (Current goals can be found in the Care Plan section)  Acute Rehab PT Goals Patient Stated Goal: to try to get up and reduce calf cramps PT Goal Formulation: With patient/family Time For Goal Achievement: 07/07/17 Potential to Achieve Goals: Good    Frequency 7X/week   Barriers to discharge Inaccessible home environment stairs to enter house    Co-evaluation               AM-PAC PT "6 Clicks" Daily Activity  Outcome Measure Difficulty turning over in bed (including adjusting bedclothes, sheets and blankets)?: Total Difficulty moving from lying on back to sitting on the side of the bed? : Total Difficulty sitting down on and standing up from a chair with arms (e.g., wheelchair, bedside commode, etc,.)?: Total Help needed moving to and from a bed to chair (including a wheelchair)?: A Little Help needed walking in hospital room?: A Little Help needed climbing 3-5 steps with a railing? : Total 6 Click Score: 10    End of Session Equipment Utilized During Treatment: Oxygen;Gait belt Activity Tolerance: Patient limited by lethargy;Patient limited by pain Patient left: in bed;with call bell/phone within reach;with bed alarm set;with family/visitor present;with nursing/sitter in  room Nurse Communication: Mobility status PT Visit Diagnosis: Unsteadiness on feet (R26.81);Muscle weakness (generalized) (M62.81);Difficulty in walking, not elsewhere classified (R26.2);Pain Pain - Right/Left: Left Pain - part of body: Hip    Time: 1700-1749 PT Time Calculation (min) (ACUTE ONLY): 37 min   Charges:   PT Evaluation $PT Eval Moderate Complexity: 1 Mod PT Treatments $Gait Training: 8-22 mins   PT G Codes:   PT G-Codes **NOT FOR INPATIENT CLASS** Functional Assessment Tool Used: AM-PAC 6 Clicks Basic Mobility    Ramond Dial 06/23/2017, 6:54 PM   Mee Hives, PT MS Acute Rehab Dept. Number: [place]

## 2017-06-24 ENCOUNTER — Encounter
Admission: RE | Admit: 2017-06-24 | Discharge: 2017-06-24 | Disposition: A | Discharge status: Home / Self Care | Source: Ambulatory Visit | Attending: Internal Medicine | Admitting: Internal Medicine

## 2017-06-24 LAB — BASIC METABOLIC PANEL
ANION GAP: 7 (ref 5–15)
BUN: 13 mg/dL (ref 6–20)
CHLORIDE: 101 mmol/L (ref 101–111)
CO2: 29 mmol/L (ref 22–32)
Calcium: 8.6 mg/dL — ABNORMAL LOW (ref 8.9–10.3)
Creatinine, Ser: 0.91 mg/dL (ref 0.44–1.00)
GFR calc Af Amer: >60 mL/min (ref >60)
Glucose, Bld: 136 mg/dL — ABNORMAL HIGH (ref 65–99)
POTASSIUM: 3.2 mmol/L — AB (ref 3.5–5.1)
SODIUM: 137 mmol/L (ref 135–145)

## 2017-06-24 LAB — CBC
HCT: 33.8 % — ABNORMAL LOW (ref 35.0–47.0)
HEMOGLOBIN: 10.7 g/dL — AB (ref 12.0–16.0)
MCH: 23.8 pg — AB (ref 26.0–34.0)
MCHC: 31.7 g/dL — ABNORMAL LOW (ref 32.0–36.0)
MCV: 75.3 fL — ABNORMAL LOW (ref 80.0–100.0)
PLATELETS: 151 10*3/uL (ref 150–440)
RBC: 4.48 MIL/uL (ref 3.80–5.20)
RDW: 17.5 % — ABNORMAL HIGH (ref 11.5–14.5)
WBC: 8.4 10*3/uL (ref 3.6–11.0)

## 2017-06-24 MED ORDER — POTASSIUM CHLORIDE 20 MEQ PO PACK
20.0000 meq | PACK | Freq: Two times a day (BID) | ORAL | Status: DC
  Administered 2017-06-24 – 2017-06-26 (×5): 20 meq via ORAL
  Filled 2017-06-24 (×5): qty 1

## 2017-06-24 NOTE — Progress Notes (Signed)
Patient weaned off of 2L O2, via [place]. Sats on RA 93-94%. Patient remains lethargic. Continued to withhold pain medications as the night progressed. Will continue to monitor pain level and oxygen saturations PRN/ with signs of change in status.

## 2017-06-24 NOTE — Progress Notes (Signed)
Subjective: 1 Day Post-Op Procedure(s) (LRB): TOTAL HIP ARTHROPLASTY ANTERIOR APPROACH (Left) Patient reports pain as mild.   Patient is well but lethargic this AM PT and Care management to assist with discharge planning. Negative for chest pain and shortness of breath Fever: no Gastrointestinal:negative for nausea and vomiting  Objective: Vital signs in last 24 hours: Temp:  [96.9 F (36.1 C)-98.8 F (37.1 C)] 98.8 F (37.1 C) (08/01 0255) Pulse Rate:  [50-85] 85 (08/01 0614) Resp:  [8-20] 18 (08/01 0255) BP: (107-176)/(59-84) 138/59 (08/01 0255) SpO2:  [92 %-100 %] 94 % (08/01 0614)  Intake/Output from previous day:  Intake/Output Summary (Last 24 hours) at 06/24/17 0757 Last data filed at 06/24/17 0610  Gross per 24 hour  Intake             2105 ml  Output             4170 ml  Net            -2065 ml    Intake/Output this shift: No intake/output data recorded.  Labs:  Recent Labs  06/23/17 1121 06/24/17 0304  HGB 11.3* 10.7*    Recent Labs  06/23/17 1121 06/24/17 0304  WBC 6.3 8.4  RBC 4.72 4.48  HCT 36.4 33.8*  PLT 150 151    Recent Labs  06/23/17 1121 06/24/17 0304  NA  --  137  K  --  3.2*  CL  --  101  CO2  --  29  BUN  --  13  CREATININE 0.83 0.91  GLUCOSE  --  136*  CALCIUM  --  8.6*   No results for input(s): LABPT, INR in the last 72 hours.   EXAM General - Patient is Appropriate, Oriented and lethargic this AM but is able to answer questions appropriately Extremity - ABD soft Sensation intact distally Intact pulses distally Dorsiflexion/Plantar flexion intact Incision: dressing C/D/I No cellulitis present Dressing/Incision - Hemovac and Woundvac in place to the left hip. Motor Function - intact, moving foot and toes well on exam.   Past Medical History:  Diagnosis Date  . Anemia   . Arthritis   . Asthma   . Diabetes mellitus without complication ([place])   . Heart murmur   . Hypertension   . Multiple thyroid nodules   .  Sleep apnea    Use C-PAP    Assessment/Plan: 1 Day Post-Op Procedure(s) (LRB): TOTAL HIP ARTHROPLASTY ANTERIOR APPROACH (Left) Active Problems:   Primary localized osteoarthrosis of left hip  Estimated body mass index is 39.14 kg/m as calculated from the following:   Height as of this encounter: 5\' 2"  (1.575 m).   Weight as of this encounter: 97.1 kg (214 lb). Advance diet Up with therapy D/C IV fluids when tolerating po intake.  Labs reviewed, K+ 3.2, will supplement. CBC and BMP ordered for tomorrow morning. Lethargic this AM O2 sats 94 on room air.  Continue to monitor pain med intake. Encouraged incentive spirometer.  DVT Prophylaxis - Lovenox, Foot Pumps and TED hose Weight-Bearing as tolerated to left leg  J. Cameron Proud, PA-C [facility] Orthopaedic Surgery 06/24/2017, 7:57 AM

## 2017-06-24 NOTE — Anesthesia Postprocedure Evaluation (Signed)
Anesthesia Post Note  Patient: Christina Mcclain  Procedure(s) Performed: Procedure(s) (LRB): TOTAL HIP ARTHROPLASTY ANTERIOR APPROACH (Left)  Patient location during evaluation: Nursing Unit Anesthesia Type: Spinal Level of consciousness: oriented and awake and alert Pain management: pain level controlled Vital Signs Assessment: post-procedure vital signs reviewed and stable Respiratory status: spontaneous breathing and respiratory function stable Cardiovascular status: blood pressure returned to baseline and stable Postop Assessment: no headache and no backache Anesthetic complications: no     Last Vitals:  Vitals:   06/24/17 0255 06/24/17 0614  BP: (!) 138/59   Pulse: 82 85  Resp: 18   Temp: 37.1 C     Last Pain:  Vitals:   06/24/17 0610  TempSrc:   PainSc: Haze Rushing A

## 2017-06-24 NOTE — Progress Notes (Signed)
Physical Therapy Treatment Patient Details Name: Tocara Mennen MRN: [id number] DOB: May 19, 1956 Today's Date: 06/24/2017    History of Present Illness 61 yo female with onset of OA B hips, had R THA in March 2018 and now direct anterior approach L hip.  PMHx:  sleep apnea, thyroid nodules, HTN, heart murmur, DM, asthma, OA, anemia    PT Comments    Pt lethargic, but agreeable to PT. Reports 5/10 pain in left hip. Pt requires assistance for exercises on left in supine and seated. All mobility slow and effortful. Mod A for bed mobility and stand transfers. Min A for limited ambulation. Mild unsteadiness once standing attained. Pt received up in chair. Plan to see pt this afternoon for continued work on range, strength, and endurance to improve functional mobility.    Follow Up Recommendations  SNF     Equipment Recommendations  None recommended by PT    Recommendations for Other Services       Precautions / Restrictions Precautions Precautions: Fall Restrictions Weight Bearing Restrictions: Yes LLE Weight Bearing: Weight bearing as tolerated    Mobility  Bed Mobility Overal bed mobility: Needs Assistance Bed Mobility: Supine to Sit     Supine to sit: Mod assist     General bed mobility comments: Increased time and instruction for sequencing/completion  Transfers Overall transfer level: Needs assistance Equipment used: Rolling walker (2 wheeled);1 person hand held assist Transfers: Sit to/from Stand Sit to Stand: Mod assist         General transfer comment: 2 attempts. Successful on second attempt with increased time, assist and instruction for sequencing and quad/glute activation  Ambulation/Gait Ambulation/Gait assistance: Min assist Ambulation Distance (Feet): 3 Feet Assistive device: Rolling walker (2 wheeled);1 person hand held assist Gait Pattern/deviations:  (side steps bed to chair) Gait velocity: reduced Gait velocity interpretation: <1.8 ft/sec,  indicative of risk for recurrent falls     Stairs            Wheelchair Mobility    Modified Rankin (Stroke Patients Only)       Balance Overall balance assessment: Needs assistance Sitting-balance support: Feet supported Sitting balance-Leahy Scale: Fair     Standing balance support: Bilateral upper extremity supported Standing balance-Leahy Scale: Fair                              Cognition Arousal/Alertness: Lethargic Behavior During Therapy: WFL for tasks assessed/performed Overall Cognitive Status: Within Functional Limits for tasks assessed                                        Exercises Total Joint Exercises Ankle Circles/Pumps: AROM;Both;10 reps;Supine Quad Sets: Strengthening;Both;10 reps;Supine Gluteal Sets: Strengthening;Both;10 reps;Supine Towel Squeeze: Strengthening;Both;10 reps;Supine Short Arc Quad: AROM;Both;10 reps;Supine Heel Slides: AAROM;Left;10 reps;Supine (AROM R) Hip ABduction/ADduction: AAROM;Left;10 reps;Supine (AROM R) Straight Leg Raises: AAROM;10 reps;Supine;Left (Strengthening on R) Long Arc Quad: AAROM;Left;10 reps;Seated    General Comments        Pertinent Vitals/Pain Pain Assessment: 0-10 Pain Score: 5  Pain Location: L hip Pain Descriptors / Indicators: Aching;Constant;Operative site guarding Pain Intervention(s): Monitored during session;Limited activity within patient's tolerance    Home Living                      Prior Function  PT Goals (current goals can now be found in the care plan section)      Frequency    BID      PT Plan Current plan remains appropriate    Co-evaluation              AM-PAC PT "6 Clicks" Daily Activity  Outcome Measure  Difficulty turning over in bed (including adjusting bedclothes, sheets and blankets)?: Total Difficulty moving from lying on back to sitting on the side of the bed? : Total Difficulty sitting down on  and standing up from a chair with arms (e.g., wheelchair, bedside commode, etc,.)?: Total Help needed moving to and from a bed to chair (including a wheelchair)?: A Little Help needed walking in hospital room?: A Lot Help needed climbing 3-5 steps with a railing? : Total 6 Click Score: 9    End of Session Equipment Utilized During Treatment: Gait belt Activity Tolerance: Patient limited by lethargy Patient left: in chair;Other (comment) (nursing assistant finishing to set pt up)   PT Visit Diagnosis: Unsteadiness on feet (R26.81);Muscle weakness (generalized) (M62.81);Difficulty in walking, not elsewhere classified (R26.2);Pain Pain - Right/Left: Left Pain - part of body: Hip     Time: 1005-1036 PT Time Calculation (min) (ACUTE ONLY): 31 min  Charges:  $Gait Training: 8-22 mins $Therapeutic Exercise: 8-22 mins                    G Codes:  Functional Assessment Tool Used: AM-PAC 6 Clicks Basic Mobility     Larae Grooms, PTA 06/24/2017, 11:13 AM

## 2017-06-24 NOTE — Progress Notes (Signed)
Physical Therapy Treatment Patient Details Name: Christina Mcclain MRN: [id number] DOB: Jan 20, 1956 Today's Date: 06/24/2017    History of Present Illness 61 yo female with onset of OA B hips, had R THA in March 2018 and now direct anterior approach L hip.  PMHx:  sleep apnea, thyroid nodules, HTN, heart murmur, DM, asthma, OA, anemia    PT Comments    Patient very slow and effortful with all movement tasks, but gradually progressing with all functional activities.  With training, able to complete transfers and short-distance gait (12') with Rw, min assist.  Unable to advance L LE without dep physical assist from therapist, but does demonstrate good WBing/stability when loading. Will continue to progress gait distance, activity tolerance and overall strength/ROM of L LE as appropriate.    Follow Up Recommendations  SNF     Equipment Recommendations       Recommendations for Other Services       Precautions / Restrictions Precautions Precautions: Fall Restrictions Weight Bearing Restrictions: Yes LLE Weight Bearing: Weight bearing as tolerated    Mobility  Bed Mobility Overal bed mobility: Needs Assistance Bed Mobility: Sit to Supine       Sit to supine: Mod assist;Max assist   General bed mobility comments: assist for LE management over edge of bed surface; limited ability to initiate/complete without therapist assist  Transfers Overall transfer level: Needs assistance Equipment used: Rolling walker (2 wheeled) Transfers: Sit to/from Stand Sit to Stand: Mod assist;Min assist         General transfer comment: cuing for hand/foot placment, tends to place R LE anterior to BOS (which hinders WBing abilities)  Ambulation/Gait Ambulation/Gait assistance: Min assist Ambulation Distance (Feet): 12 Feet Assistive device: Rolling walker (2 wheeled)       General Gait Details: 3-point, step to gait pattern; dep physical assist from therapist to advance/place L LE  throughout gait cycle. Very slow, exaggerated gait performance.  Good stance time/stability in L LE when [employer]            Wheelchair Mobility    Modified Rankin (Stroke Patients Only)       Balance Overall balance assessment: Needs assistance Sitting-balance support: No upper extremity supported;Feet supported Sitting balance-Leahy Scale: Good     Standing balance support: Bilateral upper extremity supported Standing balance-Leahy Scale: Fair                              Cognition Arousal/Alertness: Lethargic Behavior During Therapy: WFL for tasks assessed/performed Overall Cognitive Status: Within Functional Limits for tasks assessed                                        Exercises Other Exercises Other Exercises: Toilet transfer, SPT with RW, min/mod assist for standing balance, L LE advancement and overall activity sequencing; sit/stand from elevated BSC with RW, min assist.  Standing balance wiht RW, cga/min assist.    General Comments        Pertinent Vitals/Pain Pain Assessment: 0-10 Pain Score: 5  Pain Location: L hip Pain Descriptors / Indicators: Aching;Constant;Operative site guarding Pain Intervention(s): Limited activity within patient's tolerance;Monitored during session;Repositioned    Home Living                      Prior Function  PT Goals (current goals can now be found in the care plan section) Acute Rehab PT Goals Patient Stated Goal: to try to get up and reduce calf cramps PT Goal Formulation: With patient/family Time For Goal Achievement: 07/07/17 Potential to Achieve Goals: Good Progress towards PT goals: Progressing toward goals    Frequency    BID      PT Plan Current plan remains appropriate    Co-evaluation              AM-PAC PT "6 Clicks" Daily Activity  Outcome Measure  Difficulty turning over in bed (including adjusting bedclothes, sheets and  blankets)?: Total Difficulty moving from lying on back to sitting on the side of the bed? : Total Difficulty sitting down on and standing up from a chair with arms (e.g., wheelchair, bedside commode, etc,.)?: Total Help needed moving to and from a bed to chair (including a wheelchair)?: A Little Help needed walking in hospital room?: A Little Help needed climbing 3-5 steps with a railing? : A Lot 6 Click Score: 11    End of Session Equipment Utilized During Treatment: Gait belt Activity Tolerance: Patient limited by pain Patient left: in bed;with call bell/phone within reach;with bed alarm set Nurse Communication: Mobility status PT Visit Diagnosis: Unsteadiness on feet (R26.81);Muscle weakness (generalized) (M62.81);Difficulty in walking, not elsewhere classified (R26.2);Pain Pain - Right/Left: Left Pain - part of body: Hip     Time: 3559-7416 PT Time Calculation (min) (ACUTE ONLY): 29 min  Charges:  $Gait Training: 8-22 mins $Therapeutic Activity: 8-22 mins                    G Codes:       Javonne Dorko H. Owens Shark, PT, DPT, NCS 06/24/17, 3:07 PM [phone]

## 2017-06-24 NOTE — Clinical Social Work Placement (Signed)
   CLINICAL SOCIAL WORK PLACEMENT  NOTE  Date:  06/24/2017  Patient Details  Name: Christina Mcclain MRN: [id number] Date of Birth: 1956-07-28  Clinical Social Work is seeking post-discharge placement for this patient at the [place] level of care (*CSW will initial, date and re-position this form in  chart as items are completed):  Yes   Patient/family provided with [place] Work Department's list of facilities offering this level of care within the geographic area requested by the patient (or if unable, by the patient's family).  Yes   Patient/family informed of their freedom to choose among providers that offer the needed level of care, that participate in Medicare, Medicaid or managed care program needed by the patient, have an available bed and are willing to accept the patient.  Yes   Patient/family informed of [facility] [place]'s ownership interest in [facility] and [facility], as well as of the fact that they are under no obligation to receive care at these facilities.  PASRR submitted to EDS on 06/24/17     PASRR number received on 06/24/17     Existing PASRR number confirmed on       FL2 transmitted to all facilities in geographic area requested by pt/family on 06/24/17     FL2 transmitted to all facilities within larger geographic area on       Patient informed that his/her managed care company has contracts with or will negotiate with certain facilities, including the following:            Patient/family informed of bed offers received.  Patient chooses bed at       Physician recommends and patient chooses bed at      Patient to be transferred to   on  .  Patient to be transferred to facility by       Patient family notified on   of transfer.  Name of family member notified:        PHYSICIAN       Additional Comment:    _______________________________________________ Menaal Russum, Veronia Beets, LCSW 06/24/2017, 9:35 AM

## 2017-06-24 NOTE — Clinical Social Work Note (Signed)
Clinical Social Work Assessment  Patient Details  Name: Christina Mcclain MRN: [id number] Date of Birth: October 16, 1956  Date of referral:  06/24/17               Reason for consult:  Facility Placement                Permission sought to share information with:  Chartered certified accountant granted to share information::  Yes, Verbal Permission Granted  Name::      [place]::   [place]   Relationship::     Contact Information:     Housing/Transportation Living arrangements for the past 2 months:  [place] of Information:  Patient, Spouse Patient Interpreter Needed:  None Criminal Activity/Legal Involvement Pertinent to Current Situation/Hospitalization:  No - Comment as needed Significant Relationships:  Adult Children, Spouse Lives with:  Spouse Do you feel safe going back to the place where you live?  Yes Need for family participation in patient care:  Yes (Comment)  Care giving concerns:  Patient lives in [place] with her husband Christina Mcclain [phone].    Social Worker assessment / plan:  Holiday representative (CSW) received SNF consult. PT is recommending SNF. CSW met with patient and her husband Christina Mcclain was at bedside. CSW introduced self and explained role of CSW department. Patient was alert and oriented X4. Patient reported that her and her husband live in [place] and their son Christina Mcclain lives in [place]. Patient reported that she toured [facility] prior to her surgery and spoke with the admissions coordinator [place]. CSW explained that SNF search in [facility] can be started and [employer] requires authorization. Patient verbalized her understanding and is agreeable to SNF search in [facility] and prefers [facility]. FL2 complete and faxed out. PASARR is complete. CSW will continue to follow and assist as needed.   Employment status:  Retired Surveyor, [employer]  Recommendations:  [place] / Referral to community resources:  [place]  Patient/Family's Response to care:  Patient and her husband are agreeable to [facility] in [place].   Patient/Family's Understanding of and Emotional Response to Diagnosis, Current Treatment, and Prognosis:  Patient and husband were very pleasant and thanked CSW for assistance.   Emotional Assessment Appearance:  Appears stated age Attitude/Demeanor/Rapport:    Affect (typically observed):  Accepting, Adaptable, Pleasant Orientation:  Oriented to Self, Oriented to Place, Oriented to  Time, Oriented to Situation Alcohol / Substance use:  Not Applicable Psych involvement (Current and /or in the community):  No (Comment)  Discharge Needs  Concerns to be addressed:  Discharge Planning Concerns Readmission within the last 30 days:  No Current discharge risk:  Dependent with Mobility Barriers to Discharge:  Continued Medical Work up   [employer], Veronia Beets, LCSW 06/24/2017, 9:36 AM

## 2017-06-24 NOTE — Progress Notes (Signed)
Clinical Education officer, museum (CSW) presented bed offers to patient and husband and they chose [facility]. Per [facility] admissions coordinator at [facility] she will start [employer] SNF authorization today.   [employer], LCSW [phone]

## 2017-06-25 LAB — BASIC METABOLIC PANEL
Anion gap: 5 (ref 5–15)
BUN: 19 mg/dL (ref 6–20)
CALCIUM: 8.9 mg/dL (ref 8.9–10.3)
CHLORIDE: 103 mmol/L (ref 101–111)
CO2: 28 mmol/L (ref 22–32)
CREATININE: 1.04 mg/dL — AB (ref 0.44–1.00)
GFR calc Af Amer: >60 mL/min (ref >60)
GFR calc non Af Amer: 56 mL/min — ABNORMAL LOW (ref >60)
GLUCOSE: 123 mg/dL — AB (ref 65–99)
Potassium: 3.9 mmol/L (ref 3.5–5.1)
Sodium: 136 mmol/L (ref 135–145)

## 2017-06-25 LAB — CBC
HCT: 30.9 % — ABNORMAL LOW (ref 35.0–47.0)
Hemoglobin: 9.9 g/dL — ABNORMAL LOW (ref 12.0–16.0)
MCH: 24.3 pg — AB (ref 26.0–34.0)
MCHC: 32.1 g/dL (ref 32.0–36.0)
MCV: 75.9 fL — AB (ref 80.0–100.0)
PLATELETS: 140 10*3/uL — AB (ref 150–440)
RBC: 4.07 MIL/uL (ref 3.80–5.20)
RDW: 17.9 % — ABNORMAL HIGH (ref 11.5–14.5)
WBC: 11.6 10*3/uL — ABNORMAL HIGH (ref 3.6–11.0)

## 2017-06-25 LAB — SURGICAL PATHOLOGY

## 2017-06-25 MED ORDER — OXYCODONE HCL 5 MG PO TABS: 5.0000 mg | ORAL_TABLET | ORAL | 0 refills | Status: DC | PRN

## 2017-06-25 MED ORDER — TRAMADOL HCL 50 MG PO TABS: 50.0000 mg | ORAL_TABLET | Freq: Four times a day (QID) | ORAL | 1 refills | Status: DC | PRN

## 2017-06-25 MED ORDER — ENOXAPARIN SODIUM 40 MG/0.4ML ~~LOC~~ SOLN: 40.0000 mg | SUBCUTANEOUS | 0 refills | Status: DC

## 2017-06-25 NOTE — Progress Notes (Signed)
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  PMHx:  sleep apnea, thyroid nodules, HTN, heart murmur, DM, asthma, OA, anemia    PT Comments    Pt sleeping initially this afternoon, but awakens for PT. Pt continues slow with all mobility requiring increased cueing and time to complete tasks. Mod A required for bed mobility. Mod/Min A for transfers and Min guard for stand/ambulation. Mild dizziness persists for short ambulation distances. Pt received up in chair and encouraged to remain in chair for several hours this afternoon. Continue PT to progress strength, endurance, balance to improve functional mobility.    Follow Up Recommendations  SNF     Equipment Recommendations       Recommendations for Other Services       Precautions / Restrictions Precautions Precautions: Fall;Anterior Hip Restrictions Weight Bearing Restrictions: Yes LLE Weight Bearing: Weight bearing as tolerated    Mobility  Bed Mobility Overal bed mobility: Needs Assistance Bed Mobility: Supine to Sit     Supine to sit: Mod assist Sit to supine: Mod assist   General bed mobility comments: Cues for sequence and assist for trunk and to lowere LLE off edge of bed  Transfers Overall transfer level: Needs assistance Equipment used: Rolling walker (2 wheeled) Transfers: Sit to/from Stand Sit to Stand: Min assist         General transfer comment: Cues for hand, foot placement and weight shift. From bed, recliner and BSC  Ambulation/Gait Ambulation/Gait assistance: Min assist Ambulation Distance (Feet): 22 Feet (also 5 feet to/from [facility]) Assistive device: Rolling walker (2 wheeled) Gait Pattern/deviations: Step-to pattern;Decreased step length - right;Decreased stance time - left;Antalgic Gait  velocity: slow Gait velocity interpretation: <1.8 ft/sec, indicative of risk for recurrent falls General Gait Details: Continues with mild dizziness. Improved LLE clearance post stand exercises   Stairs            Wheelchair Mobility    Modified Rankin (Stroke Patients Only)       Balance Overall balance assessment: Needs assistance Sitting-balance support: Feet supported;Bilateral upper extremity supported Sitting balance-Leahy Scale: Good     Standing balance support: Bilateral upper extremity supported Standing balance-Leahy Scale: Fair                              Cognition Arousal/Alertness: Awake/alert Behavior During Therapy: WFL for tasks assessed/performed Overall Cognitive Status: Within Functional Limits for tasks assessed                                        Exercises Total Joint Exercises Ankle Circles/Pumps: AROM;Both;20 reps Quad Sets: Strengthening;Both;20 reps Gluteal Sets: Strengthening;Both;20 reps Hip ABduction/ADduction: Strengthening;Left;10 reps;Standing Straight Leg Raises: Strengthening;Left;10 reps;Standing Long Arc Quad: AAROM;Left;10 reps;Seated Marching in Standing: Strengthening;Left;10 reps;Standing Other Exercises Other Exercises: standing weight shift pre gait with quad/glute set Other Exercises: Min guard assist for personal hygiene post set up for toileting    General Comments        Pertinent Vitals/Pain Pain Assessment: 0-10 Pain Score: 3  Pain Location: L hip Pain Intervention(s): Monitored during session;Repositioned    Home Living  Prior Function            PT Goals (current goals can now be found in the care plan section) Progress towards PT goals: Progressing toward goals    Frequency    BID      PT Plan Current plan remains appropriate    Co-evaluation              AM-PAC PT "6 Clicks" Daily Activity  Outcome Measure  Difficulty  turning over in bed (including adjusting bedclothes, sheets and blankets)?: Total Difficulty moving from lying on back to sitting on the side of the bed? : Total Difficulty sitting down on and standing up from a chair with arms (e.g., wheelchair, bedside commode, etc,.)?: Total Help needed moving to and from a bed to chair (including a wheelchair)?: A Little Help needed walking in hospital room?: A Little Help needed climbing 3-5 steps with a railing? : A Lot 6 Click Score: 11    End of Session Equipment Utilized During Treatment: Gait belt Activity Tolerance: Patient limited by pain Patient left: in bed;with call bell/phone within reach;with bed alarm set;with family/visitor present   PT Visit Diagnosis: Unsteadiness on feet (R26.81);Muscle weakness (generalized) (M62.81);Difficulty in walking, not elsewhere classified (R26.2);Pain Pain - Right/Left: Left Pain - part of body: Hip     Time: 1451-1530 PT Time Calculation (min) (ACUTE ONLY): 39 min  Charges:  $Gait Training: 8-22 mins $Therapeutic Exercise: 8-22 mins $Therapeutic Activity: 8-22 mins                    G CodesLarae Grooms, PTA 06/25/2017, 4:49 PM

## 2017-06-25 NOTE — Progress Notes (Signed)
Patient a&o, no pain at the moment. Negative pressure wound therapy in place, dressing dry and intact. No signs of distress. Husband at bedside. Continue to monitor.

## 2017-06-25 NOTE — Progress Notes (Addendum)
Per [facility] admissions coordinator at [facility] authorization has been received. Plan is for patient to D/C to [facility] tomorrow pending medical clearance. Patient and her husband Earnie Larsson are aware of above. Patient is agreeable to going to a semi-private room at [facility].   Per Sharyn Lull at [place] patient will have a co-pay starting day 1. CSW made patient and husband aware of co-pay.   [employer], LCSW [phone]

## 2017-06-25 NOTE — Discharge Instructions (Signed)
ANTERIOR APPROACH TOTAL HIP REPLACEMENT POSTOPERATIVE DIRECTIONS   Hip Rehabilitation, Guidelines Following Surgery  The results of a hip operation are greatly improved after range of motion and muscle strengthening exercises. Follow all safety measures which are given to protect your hip. If any of these exercises cause increased pain or swelling in your joint, decrease the amount until you are comfortable again. Then slowly increase the exercises. Call your caregiver if you have problems or questions.   HOME CARE INSTRUCTIONS  Remove items at home which could result in a fall. This includes throw rugs or furniture in walking pathways.   ICE to the affected hip every three hours for 30 minutes at a time and then as needed for pain and swelling.  Continue to use ice on the hip for pain and swelling from surgery. You may notice swelling that will progress down to the foot and ankle.  This is normal after surgery.  Elevate the leg when you are not up walking on it.    Continue to use the breathing machine which will help keep your temperature down.  It is common for your temperature to cycle up and down following surgery, especially at night when you are not up moving around and exerting yourself.  The breathing machine keeps your lungs expanded and your temperature down.  Do not place pillow under knee, focus on keeping the knee straight while resting  DIET You may resume your previous home diet once your are discharged from the hospital.  DRESSING / WOUND CARE / SHOWERING The wound VAC can stay in place for 4 more days. If the battery runs out remove the wound VAC and apply a waterproof bandage. Staples will remain intact for 2 weeks, and then will be removed at the orthopedic department. Watch for signs of infection. Keep your dressing dry with showering.  You can keep it covered and pat dry. Change the surgical dressing daily and reapply a dry dressing each time.  ACTIVITY Walk with your  walker as instructed. Use walker as long as suggested by your caregivers. Avoid periods of inactivity such as sitting longer than an hour when not asleep. This helps prevent blood clots.  You may resume a sexual relationship in one month or when given the OK by your doctor.  You may return to work once you are cleared by your doctor.  Do not drive a car for 6 weeks or until released by you surgeon.  Do not drive while taking narcotics.  WEIGHT BEARING Weight bearing as tolerated with assist device (walker, cane, etc) as directed, use it as long as suggested by your surgeon or therapist, typically at least 4-6 weeks.  POSTOPERATIVE CONSTIPATION PROTOCOL Constipation - defined medically as fewer than three stools per week and severe constipation as less than one stool per week.  One of the most common issues patients have following surgery is constipation.  Even if you have a regular bowel pattern at home, your normal regimen is likely to be disrupted due to multiple reasons following surgery.  Combination of anesthesia, postoperative narcotics, change in appetite and fluid intake all can affect your bowels.  In order to avoid complications following surgery, here are some recommendations in order to help you during your recovery period.  Colace (docusate) - Pick up an over-the-counter form of Colace or another stool softener and take twice a day as long as you are requiring postoperative pain medications.  Take with a full glass of water daily.  If  you experience loose stools or diarrhea, hold the colace until you stool forms back up.  If your symptoms do not get better within 1 week or if they get worse, check with your doctor.  Dulcolax (bisacodyl) - Pick up over-the-counter and take as directed by the product packaging as needed to assist with the movement of your bowels.  Take with a full glass of water.  Use this product as needed if not relieved by Colace only.   MiraLax (polyethylene  glycol) - Pick up over-the-counter to have on hand.  MiraLax is a solution that will increase the amount of water in your bowels to assist with bowel movements.  Take as directed and can mix with a glass of water, juice, soda, coffee, or tea.  Take if you go more than two days without a movement. Do not use MiraLax more than once per day. Call your doctor if you are still constipated or irregular after using this medication for 7 days in a row.  If you continue to have problems with postoperative constipation, please contact the office for further assistance and recommendations.  If you experience "the worst abdominal pain ever" or develop nausea or vomiting, please contact the office immediatly for further recommendations for treatment.  ITCHING  If you experience itching with your medications, try taking only a single pain pill, or even half a pain pill at a time.  You can also use Benadryl over the counter for itching or also to help with sleep.   TED HOSE STOCKINGS Wear the elastic stockings on both legs for three weeks following surgery during the day but you may remove then at night for sleeping.  MEDICATIONS See your medication summary on the After Visit Summary that the nursing staff will review with you prior to discharge.  You may have some home medications which will be placed on hold until you complete the course of blood thinner medication.  It is important for you to complete the blood thinner medication as prescribed by your surgeon.  Continue your approved medications as instructed at time of discharge.  PRECAUTIONS If you experience chest pain or shortness of breath - call 911 immediately for transfer to the hospital emergency department.  If you develop a fever greater that 101 F, purulent drainage from wound, increased redness or drainage from wound, foul odor from the wound/dressing, or calf pain - CONTACT YOUR SURGEON.                                                    FOLLOW-UP APPOINTMENTS Make sure you keep all of your appointments after your operation with your surgeon and caregivers. You should call the office at the above phone number and make an appointment for approximately two weeks after the date of your surgery or on the date instructed by your surgeon outlined in the "After Visit Summary".  RANGE OF MOTION AND STRENGTHENING EXERCISES  These exercises are designed to help you keep full movement of your hip joint. Follow your caregiver's or physical therapist's instructions. Perform all exercises about fifteen times, three times per day or as directed. Exercise both hips, even if you have had only one joint replacement. These exercises can be done on a training (exercise) mat, on the floor, on a table or on a bed. Use whatever works the best and  is most comfortable for you. Use music or television while you are exercising so that the exercises are a pleasant break in your day. This will make your life better with the exercises acting as a break in routine you can look forward to.  Lying on your back, slowly slide your foot toward your buttocks, raising your knee up off the floor. Then slowly slide your foot back down until your leg is straight again.  Lying on your back spread your legs as far apart as you can without causing discomfort.  Lying on your side, raise your upper leg and foot straight up from the floor as far as is comfortable. Slowly lower the leg and repeat.  Lying on your back, tighten up the muscle in the front of your thigh (quadriceps muscles). You can do this by keeping your leg straight and trying to raise your heel off the floor. This helps strengthen the largest muscle supporting your knee.  Lying on your back, tighten up the muscles of your buttocks both with the legs straight and with the knee bent at a comfortable angle while keeping your heel on the floor.   IF YOU ARE TRANSFERRED TO A SKILLED REHAB FACILITY If the patient is  transferred to a skilled rehab facility following release from the hospital, a list of the current medications will be sent to the facility for the patient to continue.  When discharged from the skilled rehab facility, please have the facility set up the patient's [place] prior to being released. Also, the skilled facility will be responsible for providing the patient with their medications at time of release from the facility to include their pain medication, the muscle relaxants, and their blood thinner medication. If the patient is still at the rehab facility at time of the two week follow up appointment, the skilled rehab facility will also need to assist the patient in arranging follow up appointment in our office and any transportation needs.  MAKE SURE YOU:  Understand these instructions.  Get help right away if you are not doing well or get worse.    Pick up stool softner and laxative for home use following surgery while on pain medications. Do not submerge incision under water. Please use good hand washing techniques while changing dressing each day. May shower starting three days after surgery. Please use a clean towel to pat the incision dry following showers. Continue to use ice for pain and swelling after surgery. Do not use any lotions or creams on the incision until instructed by your surgeon.

## 2017-06-25 NOTE — Progress Notes (Signed)
Physical Therapy Treatment Patient Details Name: Christina Mcclain MRN: [id number] DOB: May 10, 1956 Today's Date: 06/25/2017    History of Present Illness 61 yo female with onset of OA B hips, had R THA in March 2018 and now direct anterior approach L hip.  PMHx:  sleep apnea, thyroid nodules, HTN, heart murmur, DM, asthma, OA, anemia    PT Comments    Pt agreeable to PT; pain left hip 4/10. Pt participates in long sit, seated and stand exercises with assist as needed. Slow transitioning and mobility throughout with increased instruction and time for all tasks. Pt with mild nausea/dizziness toward the end of a short (83ft) walk. Instruction for more equal steps lengths for less stress through Left lower extremity and cues/ stand exercises left to promote increased clearance of Left lower extremity. Pt wishes returned to bed post session. Plan to continue progress of strength, endurance, gait to improve ease of functional mobility.    Follow Up Recommendations  SNF     Equipment Recommendations       Recommendations for Other Services       Precautions / Restrictions Precautions Precautions: Fall;Anterior Hip Restrictions Weight Bearing Restrictions: Yes LLE Weight Bearing: Weight bearing as tolerated    Mobility  Bed Mobility Overal bed mobility: Needs Assistance Bed Mobility: Sit to Supine       Sit to supine: Mod assist   General bed mobility comments: Assist for LEs; uses trapeze with cues for repositioning upright in bed  Transfers Overall transfer level: Needs assistance Equipment used: Rolling walker (2 wheeled) Transfers: Sit to/from Stand Sit to Stand: Min assist;+2 safety/equipment         General transfer comment: Cues for foot placement; increased time to rise  Ambulation/Gait Ambulation/Gait assistance: Min assist Ambulation Distance (Feet): 20 Feet Assistive device: Rolling walker (2 wheeled) Gait Pattern/deviations: Step-to pattern;Decreased step  length - right;Decreased stance time - left;Antalgic Gait velocity: slow Gait velocity interpretation: <1.8 ft/sec, indicative of risk for recurrent falls General Gait Details: Mild dizziness/nausea toward end of short walk. Cues for more equal step lenght with less stress to LLE   Stairs            Wheelchair Mobility    Modified Rankin (Stroke Patients Only)       Balance Overall balance assessment: Needs assistance Sitting-balance support: Feet supported;Bilateral upper extremity supported Sitting balance-Leahy Scale: Good     Standing balance support: Bilateral upper extremity supported Standing balance-Leahy Scale: Fair                              Cognition Arousal/Alertness: Awake/alert Behavior During Therapy: WFL for tasks assessed/performed Overall Cognitive Status: Within Functional Limits for tasks assessed                                        Exercises Total Joint Exercises Ankle Circles/Pumps: AROM;Both;20 reps Quad Sets: Strengthening;Both;20 reps Gluteal Sets: Strengthening;Both;20 reps Hip ABduction/ADduction: Strengthening;Left;10 reps;Standing Straight Leg Raises: Strengthening;Left;10 reps;Standing Long Arc Quad: AAROM;Left;10 reps;Seated Marching in Standing: Strengthening;Left;10 reps;Standing Other Exercises Other Exercises: standing weight shift pre gait with quad/glute set    General Comments        Pertinent Vitals/Pain Pain Assessment: 0-10 Pain Score: 4  Pain Location: L hip Pain Intervention(s): Monitored during session;Repositioned;Ice applied    Home Living  Prior Function            PT Goals (current goals can now be found in the care plan section) Progress towards PT goals: Progressing toward goals    Frequency    BID      PT Plan Current plan remains appropriate    Co-evaluation              AM-PAC PT "6 Clicks" Daily Activity  Outcome  Measure  Difficulty turning over in bed (including adjusting bedclothes, sheets and blankets)?: Total Difficulty moving from lying on back to sitting on the side of the bed? : Total Difficulty sitting down on and standing up from a chair with arms (e.g., wheelchair, bedside commode, etc,.)?: Total Help needed moving to and from a bed to chair (including a wheelchair)?: A Little Help needed walking in hospital room?: A Little Help needed climbing 3-5 steps with a railing? : A Lot 6 Click Score: 11    End of Session Equipment Utilized During Treatment: Gait belt Activity Tolerance: Patient limited by pain Patient left: in bed;with call bell/phone within reach;with bed alarm set;with family/visitor present   PT Visit Diagnosis: Unsteadiness on feet (R26.81);Muscle weakness (generalized) (M62.81);Difficulty in walking, not elsewhere classified (R26.2);Pain Pain - Right/Left: Left Pain - part of body: Hip     Time: 6734-1937 PT Time Calculation (min) (ACUTE ONLY): 43 min  Charges:  $Gait Training: 8-22 mins $Therapeutic Exercise: 8-22 mins $Therapeutic Activity: 8-22 mins                    G Codes:        Larae Grooms, PTA 06/25/2017, 1:52 PM

## 2017-06-25 NOTE — Progress Notes (Signed)
  Subjective: 2 Days Post-Op Procedure(s) (LRB): TOTAL HIP ARTHROPLASTY ANTERIOR APPROACH (Left) Patient reports pain as moderate.   Patient seen in rounds with Dr. Rudene Christians. Patient is well, and has had no acute complaints or problems Plan is to go Rehab after hospital stay. Probable tomorrow. Negative for chest pain and shortness of breath Fever: 99.1 mild fever today. Gastrointestinal: Negative for nausea and vomiting  Objective: Vital signs in last 24 hours: Temp:  [98.9 F (37.2 C)-99.1 F (37.3 C)] 99.1 F (37.3 C) (08/01 2354) Pulse Rate:  [79-90] 90 (08/01 2354) Resp:  [18] 18 (08/01 2354) BP: (112-125)/(54-59) 125/54 (08/01 2354) SpO2:  [90 %-94 %] 90 % (08/01 2354)  Intake/Output from previous day:  Intake/Output Summary (Last 24 hours) at 06/25/17 0617 Last data filed at 06/24/17 2111  Gross per 24 hour  Intake              600 ml  Output              250 ml  Net              350 ml    Intake/Output this shift: Total I/O In: -  Out: 250 [Urine:250]  Labs:  Recent Labs  06/23/17 1121 06/24/17 0304 06/25/17 0437  HGB 11.3* 10.7* 9.9*    Recent Labs  06/24/17 0304 06/25/17 0437  WBC 8.4 11.6*  RBC 4.48 4.07  HCT 33.8* 30.9*  PLT 151 140*    Recent Labs  06/23/17 1121 06/24/17 0304  NA  --  137  K  --  3.2*  CL  --  101  CO2  --  29  BUN  --  13  CREATININE 0.83 0.91  GLUCOSE  --  136*  CALCIUM  --  8.6*   No results for input(s): LABPT, INR in the last 72 hours.   EXAM General - Patient is Alert and Oriented Extremity - Intact pulses distally with mild thigh edema. Compartment is soft. No calf tenderness. Dressing/Incision - clean, dry, with the wound fact intact. The Hemovac was removed with no complication. Motor Function - intact, moving foot and toes well on exam. The patient ambulated 12 feet with physical therapy  Past Medical History:  Diagnosis Date  . Anemia   . Arthritis   . Asthma   . Diabetes mellitus without  complication ([place])   . Heart murmur   . Hypertension   . Multiple thyroid nodules   . Sleep apnea    Use C-PAP    Assessment/Plan: 2 Days Post-Op Procedure(s) (LRB): TOTAL HIP ARTHROPLASTY ANTERIOR APPROACH (Left) Active Problems:   Primary localized osteoarthrosis of left hip  Estimated body mass index is 39.14 kg/m as calculated from the following:   Height as of this encounter: 5\' 2"  (1.575 m).   Weight as of this encounter: 97.1 kg (214 lb). Up with therapy Plan for discharge tomorrow to rehabilitation  DVT Prophylaxis - Lovenox, Foot Pumps and TED hose Weight-Bearing as tolerated to left leg  Reche Dixon, PA-C Orthopaedic Surgery 06/25/2017, 6:17 AM

## 2017-06-25 NOTE — Progress Notes (Signed)
Patient much more alert, than last night. Patient refused whole dose of scheduled Tramadol, due to concerns of increased lethargy. Up to [facility], asking RN & NT to move operative limb for her. BP stable throughout shift. Husband remained at bedside. Will continue to monitor.

## 2017-06-26 ENCOUNTER — Other Ambulatory Visit: Payer: Self-pay

## 2017-06-26 MED ORDER — TRAMADOL HCL 50 MG PO TABS
50.0000 mg | ORAL_TABLET | Freq: Four times a day (QID) | ORAL | 0 refills | Status: DC | PRN
Start: 2017-06-26 — End: 2019-11-03

## 2017-06-26 NOTE — Progress Notes (Signed)
Patient is medically stable for D/C to [facility] today. [employer] [facility] authorization has been received. Per [facility] admissions coordinator at [facility] patient can come today to private room [phone]. RN will call report to [phone] and arrange EMS for transport. Clinical Education officer, museum (CSW) sent D/C orders to [employer] via [employer]. Patient is aware of above. Patient's husband Earnie Larsson is at bedside and aware of above. Please reconsult if future social work needs arise. CSW signing off.   [employer], LCSW [phone]

## 2017-06-26 NOTE — Progress Notes (Signed)
  Subjective: 3 Days Post-Op Procedure(s) (LRB): TOTAL HIP ARTHROPLASTY ANTERIOR APPROACH (Left) Patient reports pain as moderate.   Patient seen in rounds with Dr. Rudene Christians. Patient is well, and has had no acute complaints or problems Plan is to go Rehab after hospital stay. Probable tomorrow. Negative for chest pain and shortness of breath Fever: 100.7 mild fever today. Gastrointestinal: Negative for nausea and vomiting  Objective: Vital signs in last 24 hours: Temp:  [98.1 F (36.7 C)-100.7 F (38.2 C)] 100.7 F (38.2 C) (08/02 2334) Pulse Rate:  [75-90] 90 (08/02 2334) Resp:  [18] 18 (08/02 2334) BP: (96-118)/(45-58) 118/58 (08/02 2334) SpO2:  [93 %-95 %] 94 % (08/02 2334)  Intake/Output from previous day:  Intake/Output Summary (Last 24 hours) at 06/26/17 0612 Last data filed at 06/26/17 0507  Gross per 24 hour  Intake              480 ml  Output                0 ml  Net              480 ml    Intake/Output this shift: No intake/output data recorded.  Labs:  Recent Labs  06/23/17 1121 06/24/17 0304 06/25/17 0437  HGB 11.3* 10.7* 9.9*    Recent Labs  06/24/17 0304 06/25/17 0437  WBC 8.4 11.6*  RBC 4.48 4.07  HCT 33.8* 30.9*  PLT 151 140*    Recent Labs  06/24/17 0304 06/25/17 0437  NA 137 136  K 3.2* 3.9  CL 101 103  CO2 29 28  BUN 13 19  CREATININE 0.91 1.04*  GLUCOSE 136* 123*  CALCIUM 8.6* 8.9   No results for input(s): LABPT, INR in the last 72 hours.   EXAM General - Patient is Alert and Oriented Extremity - Intact pulses distally with mild thigh edema. Compartment is soft. No calf tenderness. Dressing/Incision - clean, dry, with the wound vac intact. Motor Function - intact, moving foot and toes well on exam. The patient ambulated 22 feet with physical therapy  Past Medical History:  Diagnosis Date  . Anemia   . Arthritis   . Asthma   . Diabetes mellitus without complication ([place])   . Heart murmur   . Hypertension   . Multiple  thyroid nodules   . Sleep apnea    Use C-PAP    Assessment/Plan: 3 Days Post-Op Procedure(s) (LRB): TOTAL HIP ARTHROPLASTY ANTERIOR APPROACH (Left) Active Problems:   Primary localized osteoarthrosis of left hip  Estimated body mass index is 39.14 kg/m as calculated from the following:   Height as of this encounter: 5\' 2"  (1.575 m).   Weight as of this encounter: 97.1 kg (214 lb). Up with physical therapy this morning. Discharge today to rehabilitation  DVT Prophylaxis - Lovenox, Foot Pumps and TED hose Weight-Bearing as tolerated to left leg  Reche Dixon, PA-C Orthopaedic Surgery 06/26/2017, 6:12 AM

## 2017-06-26 NOTE — Telephone Encounter (Signed)
Rx sent to [facility] phone : [phone] , fax : [phone]  

## 2017-06-26 NOTE — Discharge Summary (Signed)
Physician Discharge Summary  Subjective: 3 Days Post-Op Procedure(s) (LRB): TOTAL HIP ARTHROPLASTY ANTERIOR APPROACH (Left) Patient reports pain as mild.   Patient seen in rounds with Dr. Rudene Christians. Patient is well, and has had no acute complaints or problems Patient is ready to go To skilled nursing for rehabilitation  Physician Discharge Summary  Patient ID: Christina Mcclain MRN: [id number] DOB/AGE: 1956/05/25 61 y.o.  Admit date: 06/23/2017 Discharge date: 06/26/2017  Admission Diagnoses:  Discharge Diagnoses:  Active Problems:   Primary localized osteoarthrosis of left hip   Discharged Condition: fair  Hospital Course: The patient is postop day 3 from a left anterior approach total hip replacement. She is doing progressively better. She has been ambulating 20 feet with physical therapy and progressing. She did have a low-grade temperature with slight fever yesterday and today. She has not received a transfusion with stable hemoglobin.  Treatments: surgery:  TOTAL HIP ARTHROPLASTY ANTERIOR APPROACH (Left)  SURGEON: Laurene Footman, MD  ASSISTANTS: None  ANESTHESIA:   spinal  EBL:  Total I/O In: 1000 [I.V.:1000] Out: 500 [Blood:500]  BLOOD ADMINISTERED:none  DRAINS: (2) Hemovact drain(s) in the Subcutaneous layer with  Suction Open   LOCAL MEDICATIONS USED:  MARCAINE     SPECIMEN:  Source of Specimen:  Left femoral head  DISPOSITION OF SPECIMEN:  PATHOLOGY  COUNTS:  YES  TOURNIQUET:  * No tourniquets in log *  IMPLANTS: Medacta AMIS standard 2 collared stem,54  Mpact cup DM with liner, and S 28 mm ceramic head  Discharge Exam: Blood pressure (!) 118/58, pulse 90, temperature (!) 100.7 F (38.2 C), temperature source Oral, resp. rate 18, height 5\' 2"  (1.575 m), weight 97.1 kg (214 lb), SpO2 94 %.   Disposition: Final discharge disposition not confirmed   Allergies as of 06/26/2017      Reactions   Entex T [pseudoephedrine-guaifenesin] Rash       Medication List    TAKE these medications   acetaminophen 500 MG tablet Commonly known as:  TYLENOL Take 500 mg by mouth daily as needed for moderate pain or headache.   albuterol 108 (90 Base) MCG/ACT inhaler Commonly known as:  PROVENTIL HFA;VENTOLIN HFA Inhale 2 puffs into the lungs every 6 (six) hours as needed for wheezing or shortness of breath.   B-12 COMPLIANCE INJECTION IJ Inject 1 Dose as directed every 30 (thirty) days.   CALCIUM 1000 + D 1000-800 MG-UNIT Tabs Generic drug:  Calcium Carb-Cholecalciferol Take 1 tablet by mouth daily.   carvedilol 12.5 MG tablet Commonly known as:  COREG Take 12.5 mg by mouth 2 (two) times daily.   cholecalciferol 1000 units tablet Commonly known as:  VITAMIN D Take 1,000 Units by mouth daily.   enoxaparin 40 MG/0.4ML injection Commonly known as:  LOVENOX Inject 0.4 mLs (40 mg total) into the skin daily.   Fluticasone-Salmeterol 250-50 MCG/DOSE Aepb Commonly known as:  ADVAIR Inhale 1 puff into the lungs daily.   loratadine 10 MG tablet Commonly known as:  CLARITIN Take 10 mg by mouth daily as needed for allergies.   magnesium oxide 400 MG tablet Commonly known as:  MAG-OX Take 400 mg by mouth daily.   meloxicam 7.5 MG tablet Commonly known as:  MOBIC Take 7.5 mg by mouth 2 (two) times daily.   metFORMIN 500 MG tablet Commonly known as:  GLUCOPHAGE Take 500 mg by mouth daily.   NIFEdipine 30 MG 24 hr tablet Commonly known as:  PROCARDIA-XL/ADALAT CC Take 30 mg by mouth daily.  oxyCODONE 5 MG immediate release tablet Commonly known as:  Oxy IR/ROXICODONE Take 1-2 tablets (5-10 mg total) by mouth every 4 (four) hours as needed for breakthrough pain.   pyridOXINE 50 MG tablet Commonly known as:  VITAMIN B-6 Take 50 mg by mouth daily.   solifenacin 10 MG tablet Commonly known as:  VESICARE Take 10 mg by mouth daily.   SYSTANE OP Apply 1 drop to eye daily as needed (dry eyes).    telmisartan-hydrochlorothiazide 80-25 MG tablet Commonly known as:  MICARDIS HCT Take 1 tablet by mouth daily.   traMADol 50 MG tablet Commonly known as:  ULTRAM Take 1 tablet (50 mg total) by mouth every 6 (six) hours as needed. What changed:  when to take this  reasons to take this   vitamin C 1000 MG tablet Take 1,000 mg by mouth daily.            Durable Medical Equipment        Start     Ordered   06/23/17 1050  DME Walker rolling  Once    Question:  Patient needs a walker to treat with the following condition  Answer:  Status post total hip replacement, left   06/23/17 1050   06/23/17 1050  DME 3 n 1  Once     06/23/17 1050   06/23/17 1050  DME Bedside commode  Once    Question:  Patient needs a bedside commode to treat with the following condition  Answer:  Status post total hip replacement, left   06/23/17 1050      Contact information for follow-up providers    Hessie Knows, MD Follow up in 2 week(s).   Specialty:  Orthopedic Surgery Why:  For staple removal Contact information: [place] [address] [phone]            Contact information for after-discharge care    Destination    [facility] Follow up.   Specialty:  [place] information: [address] [place] [phone]                  Signed: Prescott Parma, Neville Pauls 06/26/2017, 6:14 AM   Objective: Vital signs in last 24 hours: Temp:  [98.1 F (36.7 C)-100.7 F (38.2 C)] 100.7 F (38.2 C) (08/02 2334) Pulse Rate:  [75-90] 90 (08/02 2334) Resp:  [18] 18 (08/02 2334) BP: (96-118)/(45-58) 118/58 (08/02 2334) SpO2:  [93 %-95 %] 94 % (08/02 2334)  Intake/Output from previous day:  Intake/Output Summary (Last 24 hours) at 06/26/17 0614 Last data filed at 06/26/17 0507  Gross per 24 hour  Intake              480 ml  Output                0 ml  Net               480 ml    Intake/Output this shift: No intake/output data recorded.  Labs:  Recent Labs  06/23/17 1121 06/24/17 0304 06/25/17 0437  HGB 11.3* 10.7* 9.9*    Recent Labs  06/24/17 0304 06/25/17 0437  WBC 8.4 11.6*  RBC 4.48 4.07  HCT 33.8* 30.9*  PLT 151 140*    Recent Labs  06/24/17 0304 06/25/17 0437  NA 137 136  K 3.2* 3.9  CL 101 103  CO2 29 28  BUN 13 19  CREATININE 0.91 1.04*  GLUCOSE 136* 123*  CALCIUM 8.6* 8.9   No results for input(s): LABPT, INR in the last 72 hours.  EXAM: General - Patient is Alert and Oriented Extremity - Dorsiflexion/Plantar flexion intact No cellulitis present Compartment soft Incision - clean, dry, with the wound VAC intact Motor Function -  plantarflexion and dorsiflexion are intact.  Assessment/Plan: 3 Days Post-Op Procedure(s) (LRB): TOTAL HIP ARTHROPLASTY ANTERIOR APPROACH (Left) Procedure(s) (LRB): TOTAL HIP ARTHROPLASTY ANTERIOR APPROACH (Left) Past Medical History:  Diagnosis Date  . Anemia   . Arthritis   . Asthma   . Diabetes mellitus without complication ([place])   . Heart murmur   . Hypertension   . Multiple thyroid nodules   . Sleep apnea    Use C-PAP   Active Problems:   Primary localized osteoarthrosis of left hip  Estimated body mass index is 39.14 kg/m as calculated from the following:   Height as of this encounter: 5\' 2"  (1.575 m).   Weight as of this encounter: 97.1 kg (214 lb). Discharge to SNF Diet - Regular diet Follow up - in 2 weeks Activity - WBAT Disposition - Rehab Condition Upon Discharge - Stable DVT Prophylaxis - Lovenox and TED hose  Reche Dixon, PA-C Orthopaedic Surgery 06/26/2017, 6:14 AM

## 2017-06-26 NOTE — Progress Notes (Signed)
EMS called. They said it would "be a while" one person in front of this call.

## 2017-06-26 NOTE — Clinical Social Work Placement (Signed)
   CLINICAL SOCIAL WORK PLACEMENT  NOTE  Date:  06/26/2017  Patient Details  Name: Christina Mcclain MRN: [id number] Date of Birth: 1956-09-30  Clinical Social Work is seeking post-discharge placement for this patient at the [place] level of care (*CSW will initial, date and re-position this form in  chart as items are completed):  Yes   Patient/family provided with [place] Work Department's list of facilities offering this level of care within the geographic area requested by the patient (or if unable, by the patient's family).  Yes   Patient/family informed of their freedom to choose among providers that offer the needed level of care, that participate in Medicare, Medicaid or managed care program needed by the patient, have an available bed and are willing to accept the patient.  Yes   Patient/family informed of [place]'s ownership interest in [facility] and [facility], as well as of the fact that they are under no obligation to receive care at these facilities.  PASRR submitted to EDS on 06/24/17     PASRR number received on 06/24/17     Existing PASRR number confirmed on       FL2 transmitted to all facilities in geographic area requested by pt/family on 06/24/17     FL2 transmitted to all facilities within larger geographic area on       Patient informed that his/her managed care company has contracts with or will negotiate with certain facilities, including the following:        Yes   Patient/family informed of bed offers received.  Patient chooses bed at  [facility] )     Physician recommends and patient chooses bed at      Patient to be transferred to  [facility] ) on 06/26/17.  Patient to be transferred to facility by  [facility] )     Patient family notified on 06/26/17 of transfer.  Name of family member notified:   (Patient's husband Earnie Larsson is at bedside and aware of D/C today. )      PHYSICIAN       Additional Comment:    _______________________________________________ Debbi Strandberg, Veronia Beets, LCSW 06/26/2017, 8:56 AM

## 2017-06-26 NOTE — Progress Notes (Signed)
Patient is being discharged to [facility]. Called report to [place]. DC packet given to patient. NT to prepare patient for transportation. IV removed.

## 2017-06-26 NOTE — Progress Notes (Signed)
EMS here to pick up patient to transport to [facility]. VSS at time of discharge.

## 2017-06-26 NOTE — Progress Notes (Signed)
Physical Therapy Treatment Patient Details Name: Christina Mcclain MRN: [id number] DOB: 1956/06/16 Today's Date: 06/26/2017    History of Present Illness 61 yo female with onset of OA B hips, had R THA in March 2018 and now direct anterior approach L hip.  PMHx:  sleep apnea, thyroid nodules, HTN, heart murmur, DM, asthma, OA, anemia    PT Comments    Pt has be best mobility/ambulation session yet.  Still very slow and hesitant, but able to walk well out into the hall (~50 ft) with no LOBs or significant safety issues.  She was also able to do multiple sit to standing efforts most of which only required CGA and cuing for sequencing/hand placement/etc.  Pt was eager to participate and despite being very tired post session was pleased with how she did.    Follow Up Recommendations  SNF     Equipment Recommendations  None recommended by PT    Recommendations for Other Services       Precautions / Restrictions Precautions Precautions: Fall;Anterior Hip Restrictions LLE Weight Bearing: Weight bearing as tolerated    Mobility  Bed Mobility               General bed mobility comments: Pt in recliner on arrival, not tested  Transfers Overall transfer level: Needs assistance Equipment used: Rolling walker (2 wheeled) Transfers: Sit to/from Stand Sit to Stand: Min assist;Min guard         General transfer comment: Pt able to rise from sitting on multiple attempts, did need light physical assist on 1 attempt  Ambulation/Gait Ambulation/Gait assistance: Min guard Ambulation Distance (Feet): 50 Feet Assistive device: Rolling walker (2 wheeled)       General Gait Details: Pt with very slow, cautious ambulation, but was able to go much farther than she previously had.  She was inconsistent with cadence and fatigued with UEs but did not seem to have excessive favoring or limp on the L   Stairs            Wheelchair Mobility    Modified Rankin (Stroke Patients  Only)       Balance                                            Cognition Arousal/Alertness: Awake/alert Behavior During Therapy: WFL for tasks assessed/performed Overall Cognitive Status: Within Functional Limits for tasks assessed                                        Exercises Total Joint Exercises Ankle Circles/Pumps: AROM;10 reps Hip ABduction/ADduction: Strengthening;10 reps Long Arc Quad: Strengthening;10 reps Knee Flexion: Strengthening;10 reps Marching in Standing: 10 reps;Seated;AAROM;AROM    General Comments        Pertinent Vitals/Pain Pain Assessment: 0-10 Pain Score: 7     Home Living                      Prior Function            PT Goals (current goals can now be found in the care plan section) Progress towards PT goals: Progressing toward goals    Frequency    BID      PT Plan Current plan remains appropriate    Co-evaluation  AM-PAC PT "6 Clicks" Daily Activity  Outcome Measure  Difficulty turning over in bed (including adjusting bedclothes, sheets and blankets)?: Total Difficulty moving from lying on back to sitting on the side of the bed? : Total Difficulty sitting down on and standing up from a chair with arms (e.g., wheelchair, bedside commode, etc,.)?: A Lot Help needed moving to and from a bed to chair (including a wheelchair)?: A Little Help needed walking in hospital room?: A Little Help needed climbing 3-5 steps with a railing? : A Lot 6 Click Score: 12    End of Session Equipment Utilized During Treatment: Gait belt Activity Tolerance: Patient limited by fatigue;Patient limited by pain;Patient tolerated treatment well Patient left: with chair alarm set;with call bell/phone within reach;with family/visitor present Nurse Communication: Mobility status PT Visit Diagnosis: Unsteadiness on feet (R26.81);Muscle weakness (generalized) (M62.81);Difficulty in walking,  not elsewhere classified (R26.2);Pain     Time: 8119-1478 PT Time Calculation (min) (ACUTE ONLY): 41 min  Charges:  $Gait Training: 8-22 mins $Therapeutic Exercise: 8-22 mins $Therapeutic Activity: 8-22 mins                    G Codes:       Kreg Shropshire, DPT 06/26/2017, 12:29 PM

## 2017-06-26 NOTE — Care Management Important Message (Signed)
Important Message  Patient Details  Name: Christina Mcclain MRN: [id number] Date of Birth: 1956/08/17   Medicare Important Message Given:  Yes    Jolly Mango, RN 06/26/2017, 8:18 AM

## 2017-07-07 ENCOUNTER — Non-Acute Institutional Stay (SKILLED_NURSING_FACILITY): Admitting: Gerontology

## 2017-07-07 ENCOUNTER — Encounter: Payer: Self-pay | Admitting: Gerontology

## 2017-07-07 DIAGNOSIS — M1612 Unilateral primary osteoarthritis, left hip: Secondary | ICD-10-CM | POA: Diagnosis not present

## 2017-07-07 NOTE — Progress Notes (Signed)
Location:   [facility] Room Number: [place]:  SNF [phone]) Provider:  Toni Arthurs, NP-C  Kirk Ruths, MD  Patient Care Team: Kirk Ruths, MD as PCP - General (Internal Medicine)  Extended Emergency Contact Information Primary Emergency Contact: Dore,Lloyd Address: [place],  [address] [address] [place] Phone: [phone] Relation: Spouse Secondary Emergency Contact: [place] of [place] Phone: [phone] Relation: Son  Code Status:  FULL  Goals of care: Advanced Directive information Advanced Directives 06/23/2017  Does Patient Have a Medical Advance Directive? No  Would patient like information on creating a medical advance directive? No - Patient declined     Chief Complaint  Patient presents with  . Medical Management of Chronic Issues    Routine Visit    HPI:  Pt is a 61 y.o. female seen today for follow up. Pt was admitted to the facility for rehab following hospitalization for Left Total Hip Replacement. Pt has been participating in PT and OT. She has been doing well. She reports her pain is well controlled. Pt reports her appetite is good and she is having regular BMs. Pt denies n/v/d/f/c/cp/sob/ha/abd pain/dizziness/cough. Incision is well approximated with staples and honeycomb dressing CDI. Calves soft, supple. Negative Homan's sign. VSS. No other complaints.       Past Medical History:  Diagnosis Date  . Adverse effect of anesthesia    hard to awaken  . Anemia   . Arthritis   . Asthma   . Asthma without status asthmaticus    unspecified; Take advair each night  . Bronchitis   . Constipation   . Diabetes ([place]) 2010  . Diabetes mellitus type 2, uncomplicated ([place])   . Diabetes mellitus without complication ([place])   . Encounter for blood transfusion    hysterectomy 9-10 years ago  . Heart murmur    unspecified  .  Hyperlipidemia   . Hypertension 1991  . Hypothyroid   . Multiple thyroid nodules   . Osteoarthritis of right hip 02/04/2017  . Palpitations   . Pernicious anemia   . Pneumonia   . Sleep apnea    Use C-PAP; can't use mouth piece. Anxiety  . Thyroid disease    nodules  . Thyroid nodule    x2  . Urinary urgency    Past Surgical History:  Procedure Laterality Date  . ABDOMINAL HYSTERECTOMY  2011   partial  . ABDOMINAL SURGERY    . CARPAL TUNNEL RELEASE Left 2008  . CARPAL TUNNEL RELEASE Right   . CHOLECYSTECTOMY  1982  . DIAGNOSTIC LAPAROSCOPY    . JOINT REPLACEMENT Right 02/09/2017   TOTAL HIP REPLACEMENT, DR. CARTER. [place]  . KNEE ARTHROSCOPY  1990s  . TOTAL HIP ARTHROPLASTY Left 06/23/2017   Procedure: TOTAL HIP ARTHROPLASTY ANTERIOR APPROACH;  Surgeon: Hessie Knows, MD;  Location: [facility] ORS;  Service: Orthopedics;  Laterality: Left;    Allergies  Allergen Reactions  . Phenylephrine-Guaifenesin Hives and Rash  . Entex T [Pseudoephedrine-Guaifenesin] Rash    Allergies as of 07/07/2017      Reactions   Phenylephrine-guaifenesin Hives, Rash   Entex T [pseudoephedrine-guaifenesin] Rash      Medication List       Accurate as of 07/07/17 12:58 PM. Always use your most recent med list.          acetaminophen 500 MG tablet Commonly known as:  TYLENOL  Take 500 mg by mouth daily as needed for moderate pain or headache.   albuterol 108 (90 Base) MCG/ACT inhaler Commonly known as:  PROVENTIL HFA;VENTOLIN HFA Inhale 2 puffs into the lungs every 6 (six) hours as needed for wheezing or shortness of breath.   B-12 COMPLIANCE INJECTION IJ Inject 1 Dose as directed every 30 (thirty) days. On 5th day of Month   CALCIUM 500+D3 500-400 MG-UNIT Tabs Generic drug:  Calcium Carb-Cholecalciferol Take 1 tablet by mouth daily.   carvedilol 12.5 MG tablet Commonly known as:  COREG Take 12.5 mg by mouth 2 (two) times daily.   cholecalciferol 1000 units tablet Commonly known  as:  VITAMIN D Take 1,000 Units by mouth daily.   enoxaparin 40 MG/0.4ML injection Commonly known as:  LOVENOX Inject 0.4 mLs (40 mg total) into the skin daily.   Fluticasone-Salmeterol 250-50 MCG/DOSE Aepb Commonly known as:  ADVAIR Inhale 1 puff into the lungs daily.   loratadine 10 MG tablet Commonly known as:  CLARITIN Take 10 mg by mouth daily as needed for allergies.   magnesium oxide 400 MG tablet Commonly known as:  MAG-OX Take 400 mg by mouth daily.   meloxicam 7.5 MG tablet Commonly known as:  MOBIC Take 7.5 mg by mouth daily.   metFORMIN 500 MG tablet Commonly known as:  GLUCOPHAGE Take 500 mg by mouth daily.   NIFEdipine 30 MG 24 hr tablet Commonly known as:  PROCARDIA-XL/ADALAT CC Take 30 mg by mouth daily.   oxyCODONE 5 MG immediate release tablet Commonly known as:  Oxy IR/ROXICODONE Take 1-2 tablets (5-10 mg total) by mouth every 4 (four) hours as needed for breakthrough pain.   pyridOXINE 50 MG tablet Commonly known as:  VITAMIN B-6 Take 50 mg by mouth daily.   solifenacin 10 MG tablet Commonly known as:  VESICARE Take 10 mg by mouth daily.   SYSTANE 0.4-0.3 % Soln Generic drug:  Polyethyl Glycol-Propyl Glycol Apply 1 drop to eye daily as needed.   telmisartan-hydrochlorothiazide 80-25 MG tablet Commonly known as:  MICARDIS HCT Take 1 tablet by mouth daily.   traMADol 50 MG tablet Commonly known as:  ULTRAM Take 1 tablet (50 mg total) by mouth every 6 (six) hours as needed.   vitamin C 1000 MG tablet Take 1,000 mg by mouth daily.       Review of Systems  Constitutional: Negative for activity change, appetite change, chills, diaphoresis and fever.  HENT: Negative for congestion, sneezing, sore throat, trouble swallowing and voice change.   Respiratory: Negative for apnea, cough, choking, chest tightness, shortness of breath and wheezing.   Cardiovascular: Negative for chest pain, palpitations and leg swelling.  Gastrointestinal:  Negative for abdominal distention, abdominal pain, constipation, diarrhea and nausea.  Genitourinary: Negative for difficulty urinating, dysuria, frequency and urgency.  Musculoskeletal: Positive for arthralgias (typical arthritis). Negative for back pain, gait problem and myalgias.  Skin: Positive for wound. Negative for color change, pallor and rash.  Neurological: Negative for dizziness, tremors, syncope, speech difficulty, weakness, numbness and headaches.  Psychiatric/Behavioral: Negative for agitation and behavioral problems.  All other systems reviewed and are negative.   Immunization History  Administered Date(s) Administered  . PPD Test 06/26/2017  . Pneumococcal Polysaccharide-23 01/04/2013   Pertinent  Health Maintenance Due  Topic Date Due  . PAP SMEAR  12/25/1975  . MAMMOGRAM  12/24/2004  . COLONOSCOPY  12/24/2004  . INFLUENZA VACCINE  06/24/2017   No flowsheet data found. Functional Status Survey:    Vitals:  07/07/17 1202  BP: 124/70  Pulse: 76  Resp: 18  Temp: 98.6 F (37 C)  SpO2: 98%  Weight: 211 lb 1.6 oz (95.8 kg)  Height: 5\' 2"  (1.575 m)   Body mass index is 38.61 kg/m. Physical Exam  Constitutional: She is oriented to person, place, and time. Vital signs are normal. She appears well-developed and well-nourished. She is active and cooperative. She does not appear ill. No distress.  HENT:  Head: Normocephalic and atraumatic.  Mouth/Throat: Uvula is midline, oropharynx is clear and moist and mucous membranes are normal. Mucous membranes are not pale, not dry and not cyanotic.  Eyes: Pupils are equal, round, and reactive to light. Conjunctivae, EOM and lids are normal.  Neck: Trachea normal, normal range of motion and full passive range of motion without pain. Neck supple. No JVD present. No tracheal deviation, no edema and no erythema present. No thyromegaly present.  Cardiovascular: Normal rate, regular rhythm, normal heart sounds, intact distal  pulses and normal pulses.  Exam reveals no gallop, no distant heart sounds and no friction rub.   No murmur heard. Pulmonary/Chest: Effort normal and breath sounds normal. No accessory muscle usage. No respiratory distress. She has no wheezes. She has no rales. She exhibits no tenderness.  Abdominal: Normal appearance and bowel sounds are normal. She exhibits no distension and no ascites. There is no tenderness.  Musculoskeletal: She exhibits no edema.       Left hip: She exhibits decreased range of motion, decreased strength, tenderness and laceration.  Expected osteoarthritis, stiffness; calves soft, supple. Negative Homan's sign  Neurological: She is alert and oriented to person, place, and time. She has normal strength.  Skin: Skin is warm and dry. Laceration (left hip incision) noted. She is not diaphoretic. No cyanosis. No pallor. Nails show no clubbing.  Psychiatric: She has a normal mood and affect. Her speech is normal and behavior is normal. Judgment and thought content normal. Cognition and memory are normal.  Nursing note and vitals reviewed.   Labs reviewed:  Recent Labs  06/23/17 1121 06/24/17 0304 06/25/17 0437  NA  --  137 136  K  --  3.2* 3.9  CL  --  101 103  CO2  --  29 28  GLUCOSE  --  136* 123*  BUN  --  13 19  CREATININE 0.83 0.91 1.04*  CALCIUM  --  8.6* 8.9   No results for input(s): AST, ALT, ALKPHOS, BILITOT, PROT, ALBUMIN in the last 8760 hours.  Recent Labs  06/23/17 1121 06/24/17 0304 06/25/17 0437  WBC 6.3 8.4 11.6*  HGB 11.3* 10.7* 9.9*  HCT 36.4 33.8* 30.9*  MCV 77.1* 75.3* 75.9*  PLT 150 151 140*   No results found for: TSH No results found for: HGBA1C No results found for: CHOL, HDL, LDLCALC, LDLDIRECT, TRIG, CHOLHDL  Significant Diagnostic Results in last 30 days:  Dg Hip Operative Unilat W Or W/o Pelvis Left  Result Date: 06/23/2017 CLINICAL DATA:  Left total hip arthroplasty. EXAM: OPERATIVE LEFT HIP (WITH PELVIS IF PERFORMED)  TECHNIQUE: Fluoroscopic spot image(s) were submitted for interpretation post-operatively. COMPARISON:  None. FINDINGS: Limited intraoperative views demonstrate interval left total hip arthroplasty with normal alignment of the components. No acute complication. Fluoroscopy time is 30 seconds. IMPRESSION: Intraoperative x-rays demonstrating interval left total hip arthroplasty. Electronically Signed   By: Titus Dubin M.D.   On: 06/23/2017 09:27   Dg Hip Unilat W Or W/o Pelvis 2-3 Views Left  Result Date: 06/23/2017 CLINICAL  DATA:  Status post left total hip joint prosthesis placement. EXAM: DG HIP (WITH OR WITHOUT PELVIS) 2-3V LEFT COMPARISON:  Fluoro spot images of earlier today. FINDINGS: AP and cross-table lateral views reveal the presence of a prosthetic left hip joint. The interface with the native bone appears normal. No acute native bone abnormality is observed. Surgical drain lines and skin staples are present. IMPRESSION: There is no immediate postprocedure complication following left total hip joint prosthesis placement. Electronically Signed   By: David  Martinique M.D.   On: 06/23/2017 10:08    Assessment/Plan 1. Primary localized osteoarthrosis of left hip  Continue PT/OT  Continue exercises as taught by PT/OT  Continue Lovenox 40 mg SQ Q Day for DVT prophylaxis  Ice pack to the hip prn  Continue Tramadol 1 tablet po Q 6 hours prn  Continue APAP 500 mg po Q 4 hours prn mild pain, headache  Wound care per protocol  Family/ staff Communication:  Total Time:  Documentation:  Face to Face:  Family/Phone:   Labs/tests ordered:    Medication list reviewed and assessed for continued appropriateness. Monthly medication orders reviewed and signed.  Vikki Ports, NP-C Geriatrics [facility] [phone], [place] [address] Cell Phone (Mon-Fri 8am-5pm):  [phone] On Call:  [phone] & follow prompts after 5pm &  weekends Office Phone:  [phone] Office Fax:  [phone]

## 2017-10-02 ENCOUNTER — Encounter: Payer: Self-pay | Admitting: *Deleted

## 2017-10-05 ENCOUNTER — Encounter
Admission: RE | Disposition: A | Discharge status: Home / Self Care | Payer: Self-pay | Source: Ambulatory Visit | Attending: Unknown Physician Specialty

## 2017-10-05 ENCOUNTER — Ambulatory Visit: Admitting: Anesthesiology

## 2017-10-05 ENCOUNTER — Ambulatory Visit
Admission: RE | Admit: 2017-10-05 | Discharge: 2017-10-05 | Disposition: A | Discharge status: Home / Self Care | Source: Ambulatory Visit | Attending: Unknown Physician Specialty | Admitting: Unknown Physician Specialty

## 2017-10-05 ENCOUNTER — Encounter: Payer: Self-pay | Admitting: Anesthesiology

## 2017-10-05 DIAGNOSIS — E119 Type 2 diabetes mellitus without complications: Secondary | ICD-10-CM | POA: Diagnosis not present

## 2017-10-05 DIAGNOSIS — J45909 Unspecified asthma, uncomplicated: Secondary | ICD-10-CM | POA: Insufficient documentation

## 2017-10-05 DIAGNOSIS — Z7951 Long term (current) use of inhaled steroids: Secondary | ICD-10-CM | POA: Diagnosis not present

## 2017-10-05 DIAGNOSIS — Z86010 Personal history of colonic polyps: Secondary | ICD-10-CM | POA: Diagnosis not present

## 2017-10-05 DIAGNOSIS — Z9989 Dependence on other enabling machines and devices: Secondary | ICD-10-CM | POA: Insufficient documentation

## 2017-10-05 DIAGNOSIS — D123 Benign neoplasm of transverse colon: Secondary | ICD-10-CM | POA: Diagnosis not present

## 2017-10-05 DIAGNOSIS — K640 First degree hemorrhoids: Secondary | ICD-10-CM | POA: Diagnosis not present

## 2017-10-05 DIAGNOSIS — I10 Essential (primary) hypertension: Secondary | ICD-10-CM | POA: Insufficient documentation

## 2017-10-05 DIAGNOSIS — Z1211 Encounter for screening for malignant neoplasm of colon: Secondary | ICD-10-CM | POA: Insufficient documentation

## 2017-10-05 DIAGNOSIS — Z7984 Long term (current) use of oral hypoglycemic drugs: Secondary | ICD-10-CM | POA: Insufficient documentation

## 2017-10-05 DIAGNOSIS — Z96641 Presence of right artificial hip joint: Secondary | ICD-10-CM | POA: Insufficient documentation

## 2017-10-05 DIAGNOSIS — D122 Benign neoplasm of ascending colon: Secondary | ICD-10-CM | POA: Diagnosis not present

## 2017-10-05 DIAGNOSIS — G4730 Sleep apnea, unspecified: Secondary | ICD-10-CM | POA: Insufficient documentation

## 2017-10-05 DIAGNOSIS — Z79899 Other long term (current) drug therapy: Secondary | ICD-10-CM | POA: Insufficient documentation

## 2017-10-05 DIAGNOSIS — Z791 Long term (current) use of non-steroidal anti-inflammatories (NSAID): Secondary | ICD-10-CM | POA: Diagnosis not present

## 2017-10-05 HISTORY — PX: COLONOSCOPY WITH PROPOFOL: SHX5780

## 2017-10-05 LAB — GLUCOSE, CAPILLARY: Glucose-Capillary: 105 mg/dL — ABNORMAL HIGH (ref 65–99)

## 2017-10-05 SURGERY — COLONOSCOPY WITH PROPOFOL
Anesthesia: General

## 2017-10-05 MED ORDER — EPHEDRINE SULFATE 50 MG/ML IJ SOLN
INTRAMUSCULAR | Status: DC | PRN
  Administered 2017-10-05: 10 mg via INTRAVENOUS

## 2017-10-05 MED ORDER — FENTANYL CITRATE (PF) 100 MCG/2ML IJ SOLN
INTRAMUSCULAR | Status: DC | PRN
  Administered 2017-10-05: 50 ug via INTRAVENOUS
  Administered 2017-10-05 (×2): 25 ug via INTRAVENOUS

## 2017-10-05 MED ORDER — EPHEDRINE SULFATE 50 MG/ML IJ SOLN
INTRAMUSCULAR | Status: AC
  Filled 2017-10-05: qty 1

## 2017-10-05 MED ORDER — SODIUM CHLORIDE 0.9 % IV SOLN: INTRAVENOUS | Status: DC

## 2017-10-05 MED ORDER — MIDAZOLAM HCL 2 MG/2ML IJ SOLN
INTRAMUSCULAR | Status: DC | PRN
  Administered 2017-10-05: 2 mg via INTRAVENOUS

## 2017-10-05 MED ORDER — PIPERACILLIN-TAZOBACTAM 3.375 G IVPB 30 MIN
3.3750 g | Freq: Once | INTRAVENOUS | Status: AC
  Administered 2017-10-05: 3.375 g via INTRAVENOUS
  Filled 2017-10-05: qty 50

## 2017-10-05 MED ORDER — PROPOFOL 500 MG/50ML IV EMUL
INTRAVENOUS | Status: AC
  Filled 2017-10-05: qty 50

## 2017-10-05 MED ORDER — MIDAZOLAM HCL 2 MG/2ML IJ SOLN
INTRAMUSCULAR | Status: AC
  Filled 2017-10-05: qty 2

## 2017-10-05 MED ORDER — SODIUM CHLORIDE 0.9 % IV SOLN
INTRAVENOUS | Status: DC
  Administered 2017-10-05: 08:00:00 via INTRAVENOUS

## 2017-10-05 MED ORDER — PROPOFOL 500 MG/50ML IV EMUL
INTRAVENOUS | Status: DC | PRN
  Administered 2017-10-05: 120 ug/kg/min via INTRAVENOUS

## 2017-10-05 MED ORDER — FENTANYL CITRATE (PF) 100 MCG/2ML IJ SOLN
INTRAMUSCULAR | Status: AC
  Filled 2017-10-05: qty 2

## 2017-10-05 NOTE — Anesthesia Post-op Follow-up Note (Signed)
Anesthesia QCDR form completed.        

## 2017-10-05 NOTE — Anesthesia Procedure Notes (Signed)
Performed by: Cook-Martin, Cotina Freedman Pre-anesthesia Checklist: Patient identified, Emergency Drugs available, Suction available, Patient being monitored and Timeout performed Patient Re-evaluated:Patient Re-evaluated prior to induction Oxygen Delivery Method: Nasal cannula Preoxygenation: Pre-oxygenation with 100% oxygen Induction Type: IV induction Placement Confirmation: positive ETCO2 and CO2 detector       

## 2017-10-05 NOTE — Anesthesia Preprocedure Evaluation (Signed)
Anesthesia Evaluation  Patient identified by MRN, date of birth, ID band Patient awake    Reviewed: Allergy & Precautions, NPO status , Patient's Chart, lab work & pertinent test results  History of Anesthesia Complications Negative for: history of anesthetic complications  Airway Mallampati: III  TM Distance: >3 FB Neck ROM: Full    Dental  (+) Missing   Pulmonary asthma (daily controller) , sleep apnea (does not tolerate CPAP) ,    breath sounds clear to auscultation- rhonchi (-) wheezing      Cardiovascular Exercise Tolerance: Good hypertension, Pt. on medications (-) CAD, (-) Past MI and (-) Cardiac Stents  Rhythm:Regular Rate:Normal - Systolic murmurs and - Diastolic murmurs    Neuro/Psych negative neurological ROS  negative psych ROS   GI/Hepatic negative GI ROS, Neg liver ROS,   Endo/Other  diabetes, Type 2, Oral Hypoglycemic AgentsHypothyroidism   Renal/GU negative Renal ROS     Musculoskeletal  (+) Arthritis ,   Abdominal (+) + obese,   Peds  Hematology  (+) anemia ,   Anesthesia Other Findings Past Medical History: No date: Adverse effect of anesthesia     Comment:  hard to awaken No date: Anemia No date: Arthritis No date: Asthma No date: Asthma without status asthmaticus     Comment:  unspecified; Take advair each night No date: Bronchitis No date: Constipation 2010: Diabetes ([place]) No date: Diabetes mellitus type 2, uncomplicated ([facility]) No date: Diabetes mellitus without complication ([place]) No date: Encounter for blood transfusion     Comment:  hysterectomy 9-10 years ago No date: Heart murmur     Comment:  unspecified No date: Hyperlipidemia 1991: Hypertension No date: Hypothyroid No date: Multiple thyroid nodules 02/04/2017: Osteoarthritis of right hip No date: Palpitations No date: Pernicious anemia No date: Pneumonia No date: Sleep apnea     Comment:  Use C-PAP; can't use mouth  piece. Anxiety No date: Thyroid disease     Comment:  nodules No date: Thyroid nodule     Comment:  x2 No date: Urinary urgency   Reproductive/Obstetrics                             Anesthesia Physical Anesthesia Plan  ASA: III  Anesthesia Plan: General   Post-op Pain Management:    Induction: Intravenous  PONV Risk Score and Plan: 2 and Propofol infusion  Airway Management Planned: Natural Airway  Additional Equipment:   Intra-op Plan:   Post-operative Plan:   Informed Consent: I have reviewed the patients History and Physical, chart, labs and discussed the procedure including the risks, benefits and alternatives for the proposed anesthesia with the patient or authorized representative who has indicated his/her understanding and acceptance.   Dental advisory given  Plan Discussed with: CRNA and Anesthesiologist  Anesthesia Plan Comments:         Anesthesia Quick Evaluation

## 2017-10-05 NOTE — H&P (Signed)
Primary Care Physician:  Kirk Ruths, MD Primary Gastroenterologist:  Dr. Vira Agar  Pre-Procedure History & Physical: HPI:  Christina Mcclain is a 61 y.o. female is here for an colonoscopy.   Past Medical History:  Diagnosis Date  . Adverse effect of anesthesia    hard to awaken  . Anemia   . Arthritis   . Asthma   . Asthma without status asthmaticus    unspecified; Take advair each night  . Bronchitis   . Constipation   . Diabetes ([place]) 2010  . Diabetes mellitus type 2, uncomplicated ([place])   . Diabetes mellitus without complication ([place])   . Encounter for blood transfusion    hysterectomy 9-10 years ago  . Heart murmur    unspecified  . Hyperlipidemia   . Hypertension 1991  . Hypothyroid   . Multiple thyroid nodules   . Osteoarthritis of right hip 02/04/2017  . Palpitations   . Pernicious anemia   . Pneumonia   . Sleep apnea    Use C-PAP; can't use mouth piece. Anxiety  . Thyroid disease    nodules  . Thyroid nodule    x2  . Urinary urgency     Past Surgical History:  Procedure Laterality Date  . ABDOMINAL HYSTERECTOMY  2011   partial  . ABDOMINAL SURGERY    . CARPAL TUNNEL RELEASE Left 2008  . CARPAL TUNNEL RELEASE Right   . CHOLECYSTECTOMY  1982  . DIAGNOSTIC LAPAROSCOPY    . JOINT REPLACEMENT Right 02/09/2017   TOTAL HIP REPLACEMENT, DR. CARTER. [place]  . KNEE ARTHROSCOPY  1990s    Prior to Admission medications   Medication Sig Start Date End Date Taking? Authorizing Provider  carvedilol (COREG) 12.5 MG tablet Take 12.5 mg by mouth 2 (two) times daily.    Yes [provider]  Fluticasone-Salmeterol (ADVAIR) 250-50 MCG/DOSE AEPB Inhale 1 puff into the lungs daily.    Yes [provider]  magnesium oxide (MAG-OX) 400 MG tablet Take 400 mg by mouth daily.   Yes [provider]  metFORMIN (GLUCOPHAGE) 500 MG tablet Take 500 mg by mouth daily.    Yes [provider]  NIFEdipine (PROCARDIA-XL/ADALAT CC) 30  MG 24 hr tablet Take 30 mg by mouth daily.   Yes [provider]  solifenacin (VESICARE) 10 MG tablet Take 10 mg by mouth daily.    Yes [provider]  telmisartan-hydrochlorothiazide (MICARDIS HCT) 80-25 MG tablet Take 1 tablet by mouth daily.    Yes [provider]  acetaminophen (TYLENOL) 500 MG tablet Take 500 mg by mouth daily as needed for moderate pain or headache.    [provider]  albuterol (PROVENTIL HFA;VENTOLIN HFA) 108 (90 Base) MCG/ACT inhaler Inhale 2 puffs into the lungs every 6 (six) hours as needed for wheezing or shortness of breath.    [provider]  Ascorbic Acid (VITAMIN C) 1000 MG tablet Take 1,000 mg by mouth daily.    [provider]  Calcium Carb-Cholecalciferol (CALCIUM 500+D3) 500-400 MG-UNIT TABS Take 1 tablet by mouth daily.    [provider]  cholecalciferol (VITAMIN D) 1000 units tablet Take 1,000 Units by mouth daily.    [provider]  Cyanocobalamin (B-12 COMPLIANCE INJECTION IJ) Inject 1 Dose as directed every 30 (thirty) days. On 5th day of Month    [provider]  enoxaparin (LOVENOX) 40 MG/0.4ML injection Inject 0.4 mLs (40 mg total) into the skin daily. Patient not taking: Reported on 10/05/2017 06/25/17  Reche Dixon, PA-C  loratadine (CLARITIN) 10 MG tablet Take 10 mg by mouth daily as needed for allergies.    [provider]  meloxicam (MOBIC) 7.5 MG tablet Take 7.5 mg by mouth daily.     [provider]  oxyCODONE (OXY IR/ROXICODONE) 5 MG immediate release tablet Take 1-2 tablets (5-10 mg total) by mouth every 4 (four) hours as needed for breakthrough pain. Patient not taking: Reported on 10/05/2017 06/25/17   Reche Dixon, PA-C  Polyethyl Glycol-Propyl Glycol (SYSTANE) 0.4-0.3 % SOLN Apply 1 drop to eye daily as needed.    [provider]  pyridOXINE (VITAMIN B-6) 50 MG tablet Take 50 mg by mouth daily. 05/04/17   [provider]   traMADol (ULTRAM) 50 MG tablet Take 1 tablet (50 mg total) by mouth every 6 (six) hours as needed. Patient not taking: Reported on 10/05/2017 06/26/17   Toni Arthurs, NP    Allergies as of 07/22/2017 - Review Complete 07/07/2017  Allergen Reaction Noted  . Phenylephrine-guaifenesin Hives and Rash 06/27/2009  . Entex t [pseudoephedrine-guaifenesin] Rash 05/15/2017    Family History  Problem Relation Age of Onset  . Heart disease Mother   . Hypertension Mother   . Arrhythmia Mother   . Heart attack Mother   . Diabetes Mother   . Hypertension Father   . Parkinson's disease Father   . Coronary artery disease Maternal Uncle   . Heart attack Maternal Uncle   . Diabetes Sister   . Diabetes Brother   . Heart disease Brother     Social History   Socioeconomic History  . Marital status: Married    Spouse name: Not on file  . Number of children: 2  . Years of education: some college  . Highest education level: Not on file  Social Needs  . Financial resource strain: Not on file  . Food insecurity - worry: Not on file  . Food insecurity - inability: Not on file  . Transportation needs - medical: Not on file  . Transportation needs - non-medical: Not on file  Occupational History  . Occupation: retired    Comment: [employer]  Tobacco Use  . Smoking status: Never Smoker  . Smokeless tobacco: Never Used  Substance and Sexual Activity  . Alcohol use: No  . Drug use: No  . Sexual activity: Not on file  Other Topics Concern  . Not on file  Social History Narrative  . Not on file    Review of Systems: See HPI, otherwise negative ROS  Physical Exam: BP (!) 148/69   Pulse 70   Temp (!) 96.5 F (35.8 C) (Tympanic)   Resp 20   Ht 5\' 2"  (1.575 m)   Wt 97.5 kg (215 lb)   SpO2 100%   BMI 39.32 kg/m  General:   Alert,  pleasant and cooperative in NAD Head:  Normocephalic and atraumatic. Neck:  Supple; no masses or thyromegaly. Lungs:  Clear throughout  to auscultation.    Heart:  Regular rate and rhythm. Abdomen:  Soft, nontender and nondistended. Normal bowel sounds, without guarding, and without rebound.   Neurologic:  Alert and  oriented x4;  grossly normal neurologically.  Impression/Plan: Christina Mcclain is here for an colonoscopy to be performed for [facility] colon polyps  Risks, benefits, limitations, and alternatives regarding  colonoscopy have been reviewed with the patient.  Questions have been answered.  All parties agreeable.   Gaylyn Cheers, MD  10/05/2017, 8:29 AM

## 2017-10-05 NOTE — Anesthesia Postprocedure Evaluation (Signed)
Anesthesia Post Note  Patient: [name]  Procedure(s) Performed: COLONOSCOPY WITH PROPOFOL (N/A )  Patient location during evaluation: Endoscopy Anesthesia Type: General Level of consciousness: awake and alert and oriented Pain management: pain level controlled Vital Signs Assessment: post-procedure vital signs reviewed and stable Respiratory status: spontaneous breathing, nonlabored ventilation and respiratory function stable Cardiovascular status: blood pressure returned to baseline and stable Postop Assessment: no signs of nausea or vomiting Anesthetic complications: no     Last Vitals:  Vitals:   10/05/17 0927 10/05/17 0937  BP: (!) 136/51 134/68  Pulse: 65 61  Resp: 14 14  Temp:    SpO2: 100% 99%    Last Pain:  Vitals:   10/05/17 0907  TempSrc: Tympanic                 Christina Mcclain

## 2017-10-05 NOTE — Op Note (Signed)
[facility] Patient Name: Christina Mcclain Procedure Date: 10/05/2017 8:27 AM MRN: [id number] Account #: [id number] Date of Birth: 07-23-1956 Admit Type: Outpatient Age: 61 Room: [facility] ENDO ROOM 1 Gender: Female Note Status: Finalized Procedure:            Colonoscopy Indications:          High risk colon cancer surveillance: Personal history                        of colonic polyps Providers:            Manya Silvas, MD Referring MD:         Ocie Cornfield. Ouida Sills MD, MD (Referring MD) Medicines:            Propofol per Anesthesia Complications:        No immediate complications. Procedure:            Pre-Anesthesia Assessment:                       - After reviewing the risks and benefits, the patient                        was deemed in satisfactory condition to undergo the                        procedure.                       After obtaining informed consent, the colonoscope was                        passed under direct vision. Throughout the procedure,                        the patient's blood pressure, pulse, and oxygen                        saturations were monitored continuously. The                        Colonoscope was introduced through the anus and                        advanced to the the cecum, identified by appendiceal                        orifice and ileocecal valve. The colonoscopy was                        performed without difficulty. The patient tolerated the                        procedure well. The quality of the bowel preparation                        was good. Findings:      A small polyp was found in the ascending colon. The polyp was sessile.       The polyp was removed with a saline injection-lift technique using a hot       snare. Resection and retrieval were complete.  Three sessile polyps were found in the transverse colon. The polyps were       diminutive in size. These polyps were removed with  a jumbo cold forceps.       Resection and retrieval were complete.      Internal hemorrhoids were found during endoscopy. The hemorrhoids were       small and Grade I (internal hemorrhoids that do not prolapse).      The exam was otherwise without abnormality. Impression:           - One small polyp in the ascending colon, removed using                        injection-lift and a hot snare. Resected and retrieved.                       - Three diminutive polyps in the transverse colon,                        removed with a jumbo cold forceps. Resected and                        retrieved.                       - Internal hemorrhoids.                       - The examination was otherwise normal. Recommendation:       - Await pathology results. Manya Silvas, MD 10/05/2017 9:06:29 AM This report has been signed electronically. Number of Addenda: 0 Note Initiated On: 10/05/2017 8:27 AM Scope Withdrawal Time: 0 hours 7 minutes 22 seconds  Total Procedure Duration: 0 hours 28 minutes 0 seconds       [facility]

## 2017-10-05 NOTE — Transfer of Care (Signed)
Immediate Anesthesia Transfer of Care Note  Patient: Christina Mcclain  Procedure(s) Performed: COLONOSCOPY WITH PROPOFOL (N/A )  Patient Location: PACU  Anesthesia Type:General  Level of Consciousness: awake and sedated  Airway & Oxygen Therapy: Patient Spontanous Breathing and Patient connected to nasal cannula oxygen  Post-op Assessment: Report given to RN and Post -op Vital signs reviewed and stable  Post vital signs: Reviewed and stable  Last Vitals:  Vitals:   10/05/17 0752  BP: (!) 148/69  Pulse: 70  Resp: 20  Temp: (!) 35.8 C  SpO2: 100%    Last Pain:  Vitals:   10/05/17 0752  TempSrc: Tympanic         Complications: No apparent anesthesia complications

## 2017-10-06 ENCOUNTER — Encounter: Payer: Self-pay | Admitting: Unknown Physician Specialty

## 2017-10-06 LAB — SURGICAL PATHOLOGY

## 2017-12-04 ENCOUNTER — Encounter: Payer: Self-pay | Admitting: Emergency Medicine

## 2017-12-04 ENCOUNTER — Emergency Department

## 2017-12-04 ENCOUNTER — Other Ambulatory Visit: Payer: Self-pay

## 2017-12-04 ENCOUNTER — Emergency Department
Admission: EM | Admit: 2017-12-04 | Discharge: 2017-12-04 | Disposition: A | Discharge status: Home / Self Care | Attending: Emergency Medicine | Admitting: Emergency Medicine

## 2017-12-04 DIAGNOSIS — J45909 Unspecified asthma, uncomplicated: Secondary | ICD-10-CM | POA: Insufficient documentation

## 2017-12-04 DIAGNOSIS — I10 Essential (primary) hypertension: Secondary | ICD-10-CM | POA: Insufficient documentation

## 2017-12-04 DIAGNOSIS — Z79899 Other long term (current) drug therapy: Secondary | ICD-10-CM | POA: Insufficient documentation

## 2017-12-04 DIAGNOSIS — E119 Type 2 diabetes mellitus without complications: Secondary | ICD-10-CM | POA: Diagnosis not present

## 2017-12-04 DIAGNOSIS — R079 Chest pain, unspecified: Secondary | ICD-10-CM | POA: Insufficient documentation

## 2017-12-04 DIAGNOSIS — E039 Hypothyroidism, unspecified: Secondary | ICD-10-CM | POA: Insufficient documentation

## 2017-12-04 DIAGNOSIS — Z7984 Long term (current) use of oral hypoglycemic drugs: Secondary | ICD-10-CM | POA: Insufficient documentation

## 2017-12-04 LAB — BASIC METABOLIC PANEL
ANION GAP: 8 (ref 5–15)
BUN: 19 mg/dL (ref 6–20)
CHLORIDE: 101 mmol/L (ref 101–111)
CO2: 31 mmol/L (ref 22–32)
Calcium: 9.2 mg/dL (ref 8.9–10.3)
Creatinine, Ser: 1.01 mg/dL — ABNORMAL HIGH (ref 0.44–1.00)
GFR calc non Af Amer: 58 mL/min — ABNORMAL LOW (ref >60)
GLUCOSE: 105 mg/dL — AB (ref 65–99)
POTASSIUM: 3.4 mmol/L — AB (ref 3.5–5.1)
Sodium: 140 mmol/L (ref 135–145)

## 2017-12-04 LAB — CBC
HEMATOCRIT: 37.1 % (ref 35.0–47.0)
HEMOGLOBIN: 11.7 g/dL — AB (ref 12.0–16.0)
MCH: 24.3 pg — AB (ref 26.0–34.0)
MCHC: 31.4 g/dL — AB (ref 32.0–36.0)
MCV: 77.4 fL — AB (ref 80.0–100.0)
Platelets: 174 10*3/uL (ref 150–440)
RBC: 4.8 MIL/uL (ref 3.80–5.20)
RDW: 17.4 % — ABNORMAL HIGH (ref 11.5–14.5)
WBC: 3.8 10*3/uL (ref 3.6–11.0)

## 2017-12-04 LAB — TROPONIN I: Troponin I: <0.03 ng/mL (ref <0.03)

## 2017-12-04 LAB — FIBRIN DERIVATIVES D-DIMER (ARMC ONLY): Fibrin derivatives D-dimer (ARMC): 950.18 ng/mL (FEU) — ABNORMAL HIGH (ref 0.00–499.00)

## 2017-12-04 MED ORDER — GI COCKTAIL ~~LOC~~: 30.0000 mL | Freq: Once | ORAL | Status: DC

## 2017-12-04 MED ORDER — IOPAMIDOL (ISOVUE-370) INJECTION 76%
75.0000 mL | Freq: Once | INTRAVENOUS | Status: AC | PRN
  Administered 2017-12-04: 75 mL via INTRAVENOUS

## 2017-12-04 MED ORDER — ACETAMINOPHEN 500 MG PO TABS
1000.0000 mg | ORAL_TABLET | Freq: Once | ORAL | Status: AC
  Administered 2017-12-04: 1000 mg via ORAL

## 2017-12-04 MED ORDER — ALUM & MAG HYDROXIDE-SIMETH 400-400-40 MG/5ML PO SUSP: 5.0000 mL | Freq: Four times a day (QID) | ORAL | 0 refills | Status: DC | PRN

## 2017-12-04 MED ORDER — SODIUM CHLORIDE 0.9 % IV BOLUS (SEPSIS)
500.0000 mL | Freq: Once | INTRAVENOUS | Status: AC
  Administered 2017-12-04: 500 mL via INTRAVENOUS

## 2017-12-04 MED ORDER — NITROGLYCERIN 0.4 MG SL SUBL
0.4000 mg | SUBLINGUAL_TABLET | SUBLINGUAL | Status: DC | PRN
  Administered 2017-12-04: 0.4 mg via SUBLINGUAL
  Filled 2017-12-04: qty 1

## 2017-12-04 MED ORDER — ASPIRIN 81 MG PO CHEW
324.0000 mg | CHEWABLE_TABLET | Freq: Once | ORAL | Status: AC
  Administered 2017-12-04: 324 mg via ORAL
  Filled 2017-12-04: qty 4

## 2017-12-04 MED ORDER — GI COCKTAIL ~~LOC~~
30.0000 mL | Freq: Once | ORAL | Status: AC
  Administered 2017-12-04: 30 mL via ORAL
  Filled 2017-12-04: qty 30

## 2017-12-04 MED ORDER — ACETAMINOPHEN 500 MG PO TABS
ORAL_TABLET | ORAL | Status: AC
  Filled 2017-12-04: qty 2

## 2017-12-04 MED ORDER — FAMOTIDINE 20 MG PO TABS: 20.0000 mg | ORAL_TABLET | Freq: Two times a day (BID) | ORAL | 1 refills | Status: DC

## 2017-12-04 NOTE — Discharge Instructions (Signed)

## 2017-12-04 NOTE — ED Provider Notes (Signed)
[facility] Emergency Department Provider Note  ____________________________________________  Time seen: Approximately 8:16 AM  I have reviewed the triage vital signs and the nursing notes.   HISTORY  Chief Complaint Chest Pain   HPI Christina Mcclain is a 62 y.o. female with a history of pernicious anemia, bronchitis, diabetes, hypertension, hyperlipidemia, hypothyroidism, sleep apnea who presents for evaluation of chest pain. Patient reports that the pain started yesterday and she describes this as indigestion/ pressure in the center of her chest radiating to her jaw associated with mild shortness of breath. She denies that the pain was worse with exertion however does endorse that the shortness of breath was worse with exertion. She reports she also had a lot of belching and gas yesterday and she attributed to reflux. She took some Pepto-Bismol with no significant improvement. No diaphoresis, dizziness, nausea, vomiting. Patient denies any personal history of heart disease. She has family history of heart attacks in her mother and atrial fibrillation and her siblings. She endorses mild congestion but no cough. Currently her pain is 4 out of 10. This morning she also endorses a sharp pain that is located under her right breast, pleuritic, intermittent. The sharp pain is not present at this time. Patient denies any personal or family history blood clots, hemoptysis, exogenous hormones. She does have bilateral leg swelling however that is chronic for her. She denies leg pain. Patient endorses a recent 4 hour car trip however she made multiple stops during the trip. She is not a smoker.  Past Medical History:  Diagnosis Date  . Adverse effect of anesthesia    hard to awaken  . Anemia   . Arthritis   . Asthma   . Asthma without status asthmaticus    unspecified; Take advair each night  . Bronchitis   . Constipation   . Diabetes ([place]) 2010  . Diabetes mellitus  type 2, uncomplicated ([place])   . Diabetes mellitus without complication ([place])   . Encounter for blood transfusion    hysterectomy 9-10 years ago  . Heart murmur    unspecified  . Hyperlipidemia   . Hypertension 1991  . Hypothyroid   . Multiple thyroid nodules   . Osteoarthritis of right hip 02/04/2017  . Palpitations   . Pernicious anemia   . Pneumonia   . Sleep apnea    Use C-PAP; can't use mouth piece. Anxiety  . Thyroid disease    nodules  . Thyroid nodule    x2  . Urinary urgency     Patient Active Problem List   Diagnosis Date Noted  . Primary localized osteoarthrosis of left hip 06/23/2017    Past Surgical History:  Procedure Laterality Date  . ABDOMINAL HYSTERECTOMY  2011   partial  . ABDOMINAL SURGERY    . CARPAL TUNNEL RELEASE Left 2008  . CARPAL TUNNEL RELEASE Right   . CHOLECYSTECTOMY  1982  . COLONOSCOPY WITH PROPOFOL N/A 10/05/2017   Procedure: COLONOSCOPY WITH PROPOFOL;  Surgeon: Manya Silvas, MD;  Location: [facility] ENDOSCOPY;  Service: Endoscopy;  Laterality: N/A;  . DIAGNOSTIC LAPAROSCOPY    . JOINT REPLACEMENT Right 02/09/2017   TOTAL HIP REPLACEMENT, DR. CARTER. [place]  . KNEE ARTHROSCOPY  1990s  . TOTAL HIP ARTHROPLASTY Left 06/23/2017   Procedure: TOTAL HIP ARTHROPLASTY ANTERIOR APPROACH;  Surgeon: Hessie Knows, MD;  Location: [facility] ORS;  Service: Orthopedics;  Laterality: Left;    Prior to Admission medications   Medication Sig Start Date End Date Taking? Authorizing Provider  albuterol (PROVENTIL HFA;VENTOLIN HFA) 108 (90 Base) MCG/ACT inhaler Inhale 2 puffs into the lungs every 6 (six) hours as needed for wheezing or shortness of breath.   Yes [provider]  Ascorbic Acid (VITAMIN C) 1000 MG tablet Take 1,000 mg by mouth daily.   Yes [provider]  Calcium Carb-Cholecalciferol (CALCIUM 500+D3) 500-400 MG-UNIT TABS Take 1 tablet by mouth daily.   Yes [provider]  carvedilol (COREG) 12.5 MG tablet Take 12.5  mg by mouth 2 (two) times daily.    Yes [provider]  cholecalciferol (VITAMIN D) 1000 units tablet Take 1,000 Units by mouth daily.   Yes [provider]  Fluticasone-Salmeterol (ADVAIR) 250-50 MCG/DOSE AEPB Inhale 1 puff into the lungs daily.    Yes [provider]  loratadine (CLARITIN) 10 MG tablet Take 10 mg by mouth daily as needed for allergies.   Yes [provider]  magnesium oxide (MAG-OX) 400 MG tablet Take 400 mg by mouth daily.   Yes [provider]  meloxicam (MOBIC) 7.5 MG tablet Take 7.5 mg by mouth daily.    Yes [provider]  metFORMIN (GLUCOPHAGE) 500 MG tablet Take 500 mg by mouth daily.    Yes [provider]  NIFEdipine (PROCARDIA-XL/ADALAT CC) 30 MG 24 hr tablet Take 30 mg by mouth daily.   Yes [provider]  Polyethyl Glycol-Propyl Glycol (SYSTANE) 0.4-0.3 % SOLN Apply 1 drop to eye daily as needed.   Yes [provider]  pyridOXINE (VITAMIN B-6) 50 MG tablet Take 50 mg by mouth daily. 05/04/17  Yes [provider]  solifenacin (VESICARE) 10 MG tablet Take 10 mg by mouth daily.    Yes [provider]  telmisartan-hydrochlorothiazide (MICARDIS HCT) 80-25 MG tablet Take 1 tablet by mouth daily.    Yes [provider]  Cyanocobalamin (B-12 COMPLIANCE INJECTION IJ) Inject 1 Dose as directed every 30 (thirty) days. On 5th day of Month    [provider]  enoxaparin (LOVENOX) 40 MG/0.4ML injection Inject 0.4 mLs (40 mg total) into the skin daily. Patient not taking: Reported on 10/05/2017 06/25/17   Reche Dixon, PA-C  oxyCODONE (OXY IR/ROXICODONE) 5 MG immediate release tablet Take 1-2 tablets (5-10 mg total) by mouth every 4 (four) hours as needed for breakthrough pain. Patient not taking: Reported on 10/05/2017 06/25/17   Reche Dixon, PA-C  traMADol (ULTRAM) 50 MG tablet Take 1 tablet (50 mg total) by mouth every 6 (six) hours as needed. Patient not taking:  Reported on 10/05/2017 06/26/17   Toni Arthurs, NP    Allergies Entex t [pseudoephedrine-guaifenesin]  Family History  Problem Relation Age of Onset  . Heart disease Mother   . Hypertension Mother   . Arrhythmia Mother   . Heart attack Mother   . Diabetes Mother   . Hypertension Father   . Parkinson's disease Father   . Coronary artery disease Maternal Uncle   . Heart attack Maternal Uncle   . Diabetes Sister   . Diabetes Brother   . Heart disease Brother     Social History Social History   Tobacco Use  . Smoking status: Never Smoker  . Smokeless tobacco: Never Used  Substance Use Topics  . Alcohol use: No  . Drug use: No    Review of Systems  Constitutional: Negative for fever. Eyes: Negative for visual changes. ENT: Negative for sore throat. Neck: No neck pain  Cardiovascular: + chest pain. Respiratory: + shortness of breath. Gastrointestinal: Negative for  abdominal pain, vomiting or diarrhea. Genitourinary: Negative for dysuria. Musculoskeletal: Negative for back pain. Skin: Negative for rash. Neurological: Negative for headaches, weakness or numbness. Psych: No SI or HI  ____________________________________________   PHYSICAL EXAM:  VITAL SIGNS: ED Triage Vitals [12/04/17 0742]  Enc Vitals Group     BP (!) 159/80     Pulse Rate 66     Resp 18     Temp 97.8 F (36.6 C)     Temp Source Oral     SpO2 98 %     Weight 212 lb (96.2 kg)     Height 5\' 2"  (1.575 m)     Head Circumference      Peak Flow      Pain Score 3     Pain Loc      Pain Edu?      Excl. in [place]?     Constitutional: Alert and oriented. Well appearing and in no apparent distress. HEENT:      Head: Normocephalic and atraumatic.         Eyes: Conjunctivae are normal. Sclera is non-icteric.       Mouth/Throat: Mucous membranes are moist.       Neck: Supple with no signs of meningismus. Cardiovascular: Regular rate and rhythm. No murmurs, gallops, or rubs. 2+ symmetrical distal  pulses are present in all extremities. No JVD. Respiratory: Normal respiratory effort. Lungs are clear to auscultation bilaterally. No wheezes, crackles, or rhonchi.  Gastrointestinal: Soft, non tender, and non distended with positive bowel sounds. No rebound or guarding. Musculoskeletal: 1+ pitting edema on b/l LE Neurologic: Normal speech and language. Face is symmetric. Moving all extremities. No gross focal neurologic deficits are appreciated. Skin: Skin is warm, dry and intact. No rash noted. Psychiatric: Mood and affect are normal. Speech and behavior are normal.  ____________________________________________   LABS (all labs ordered are listed, but only abnormal results are displayed)  Labs Reviewed  BASIC METABOLIC PANEL - Abnormal; Notable for the following components:      Result Value   Potassium 3.4 (*)    Glucose, Bld 105 (*)    Creatinine, Ser 1.01 (*)    GFR calc non Af Amer 58 (*)    All other components within normal limits  CBC - Abnormal; Notable for the following components:   Hemoglobin 11.7 (*)    MCV 77.4 (*)    MCH 24.3 (*)    MCHC 31.4 (*)    RDW 17.4 (*)    All other components within normal limits  FIBRIN DERIVATIVES D-DIMER ([facility] ONLY) - Abnormal; Notable for the following components:   Fibrin derivatives D-dimer ([facility]) 950.18 (*)    All other components within normal limits  TROPONIN I  TROPONIN I   ____________________________________________  EKG  ED ECG REPORT I, Rudene Re, the attending physician, personally viewed and interpreted this ECG.  Normal sinus rhythm, rate of 66, LVH, normal intervals, normal axis, no ST elevations or depressions. No prior for comparison. ____________________________________________  RADIOLOGY  CXR: Changes in the lingula and right middle lobe consistent with atelectasis and/or scarring.   CTA chest: No evidence of pulmonary emboli. Bibasilar atelectatic changes similar to that seen on the prior  plain film examination. No other focal abnormality is noted. Aortic Atherosclerosis (ICD10-I70.0).  ____________________________________________   PROCEDURES  Procedure(s) performed: None Procedures Critical Care performed:  None ____________________________________________   INITIAL IMPRESSION / ASSESSMENT AND PLAN / ED COURSE  62 y.o. female with a history of pernicious  anemia, bronchitis, diabetes, hypertension, hyperlipidemia, hypothyroidism, sleep apnea who presents for evaluation of chest pain/ indigestion associated with SOB and radiating to her jaw since yesterday. Patient is well-appearing, in no distress, has normal vital signs, heart regular rate and rhythm with no murmurs, strong distal pulses in all 4 extremities, lungs are clear to auscultation, 1+ equal pitting edema bilateral lower extremities. EKG with no ischemic changes. Differential diagnoses including ACS versus PE with recent travel versus gerd/indigestion. Will give ASA and nitro. WIll monitor on telemetry. Will cycle troponin and get d-dimer. Labs and CXR pending  Clinical Course as of Dec 05 1227  Fri Dec 04, 2017  0921 Patient received nitroglycerin and reports no change in her pain which is 3/10. Will give GI cocktail. Patient complaining of HA after nitro, tylenol given.  [CV]  1005 D-dimer elevated. CTA ordered  [CV]    Clinical Course User Index [CV] Alfred Levins [place], MD   _________________________ 12:27 PM on 12/04/2017 -----------------------------------------  Pain resolved after GI cocktail. Second troponin is negative. CT with no evidence of PE, pneumonia, pneumothorax, or dissection. Patient remains well appearing and at this time I feel patient is safe for dc home with close outpatient follow up with Cardiology. I will provide her with a prescription for Pepcid, Maalox, and referral to cardiology for further evaluation. Discussed with the patient and if the chest pain returns or she develops new  chest pain that she needs to return to the emergency room for further evaluation and not wait for her cardiologist appointment. Patient is comfortable with this plan.  As part of my medical decision making, I reviewed the following data within the [place] notes reviewed and incorporated, Labs reviewed , EKG interpreted , Radiograph reviewed , Notes from prior ED visits and [place] Controlled Substance Database    Pertinent labs & imaging results that were available during my care of the patient were reviewed by me and considered in my medical decision making (see chart for details).    ____________________________________________   FINAL CLINICAL IMPRESSION(S) / ED DIAGNOSES  Final diagnoses:  Chest pain, unspecified type      NEW MEDICATIONS STARTED DURING THIS VISIT:  ED Discharge Orders    None       Note:  This document was prepared using Dragon voice recognition software and may include unintentional dictation errors.    Rudene Re, MD 12/04/17 1229

## 2017-12-04 NOTE — ED Triage Notes (Signed)
Pt reports central chest pain and jaw pain, states started as indigestion and burping yesterday, states today had sharp chest pain on the right side.  Recently traveled by car for 3 hours from [facility].    Pt also states she had some SHOB as well.

## 2017-12-04 NOTE — ED Notes (Signed)
AAOx3.  Skin warm and dry.  NAD 

## 2017-12-16 DIAGNOSIS — Z6841 Body Mass Index (BMI) 40.0 and over, adult: Secondary | ICD-10-CM | POA: Insufficient documentation

## 2018-02-13 ENCOUNTER — Inpatient Hospital Stay
Admit: 2018-02-13 | Discharge: 2018-02-13 | Disposition: A | Discharge status: Home / Self Care | Payer: TRICARE (CHAMPUS) | Attending: Emergency Medicine

## 2018-02-13 ENCOUNTER — Emergency Department: Admit: 2018-02-13 | Payer: TRICARE (CHAMPUS) | Primary: Family Medicine

## 2018-02-13 DIAGNOSIS — J209 Acute bronchitis, unspecified: Secondary | ICD-10-CM

## 2018-02-13 LAB — CBC WITH AUTOMATED DIFF
ABS. BASOPHILS: 0.1 10*3/uL (ref 0.0–0.1)
ABS. EOSINOPHILS: 0 10*3/uL (ref 0.0–0.4)
ABS. LYMPHOCYTES: 1.6 10*3/uL (ref 0.9–3.6)
ABS. MONOCYTES: 1.1 10*3/uL (ref 0.05–1.2)
ABS. NEUTROPHILS: 2.6 10*3/uL (ref 1.8–8.0)
BASOPHILS: 1 % (ref 0–2)
EOSINOPHILS: 1 % (ref 0–5)
HCT: 38.4 % (ref 35.0–45.0)
HGB: 12 g/dL (ref 12.0–16.0)
LYMPHOCYTES: 30 % (ref 21–52)
MCH: 24.9 PG (ref 24.0–34.0)
MCHC: 31.3 g/dL (ref 31.0–37.0)
MCV: 79.8 FL (ref 74.0–97.0)
MONOCYTES: 21 % — ABNORMAL HIGH (ref 3–10)
MPV: 12.2 FL — ABNORMAL HIGH (ref 9.2–11.8)
NEUTROPHILS: 47 % (ref 40–73)
PLATELET: 173 10*3/uL (ref 135–420)
RBC: 4.81 M/uL (ref 4.20–5.30)
RDW: 16.9 % — ABNORMAL HIGH (ref 11.6–14.5)
WBC: 5.4 10*3/uL (ref 4.6–13.2)

## 2018-02-13 LAB — METABOLIC PANEL, COMPREHENSIVE
A-G Ratio: 1.1 (ref 0.8–1.7)
ALT (SGPT): 21 U/L (ref 13–56)
AST (SGOT): 23 U/L (ref 15–37)
Albumin: 3.6 g/dL (ref 3.4–5.0)
Alk. phosphatase: 109 U/L (ref 45–117)
Anion gap: 6 mmol/L (ref 3.0–18)
BUN/Creatinine ratio: 18 (ref 12–20)
BUN: 17 MG/DL (ref 7.0–18)
Bilirubin, total: 0.5 MG/DL (ref 0.2–1.0)
CO2: 31 mmol/L (ref 21–32)
Calcium: 8.8 MG/DL (ref 8.5–10.1)
Chloride: 103 mmol/L (ref 100–108)
Creatinine: 0.97 MG/DL (ref 0.6–1.3)
GFR est AA: >60 mL/min/{1.73_m2} (ref >60)
GFR est non-AA: 58 mL/min/{1.73_m2} — ABNORMAL LOW (ref >60)
Globulin: 3.2 g/dL (ref 2.0–4.0)
Glucose: 96 mg/dL (ref 74–99)
Potassium: 3.2 mmol/L — ABNORMAL LOW (ref 3.5–5.5)
Protein, total: 6.8 g/dL (ref 6.4–8.2)
Sodium: 140 mmol/L (ref 136–145)

## 2018-02-13 LAB — CARDIAC PANEL,(CK, CKMB & TROPONIN)
CK - MB: <1 ng/ml (ref <3.6)
CK: 649 U/L — ABNORMAL HIGH (ref 26–192)
Troponin-I, QT: <0.02 NG/ML (ref 0.0–0.045)

## 2018-02-13 LAB — NT-PRO BNP: NT pro-BNP: 85 PG/ML (ref 0–900)

## 2018-02-13 MED ORDER — BENZONATATE 200 MG CAP
200 mg | ORAL_CAPSULE | Freq: Three times a day (TID) | ORAL | 0 refills | Status: AC | PRN
Start: 2018-02-13 — End: 2018-02-20

## 2018-02-13 MED ORDER — AZITHROMYCIN 250 MG TAB
250 mg | ORAL_TABLET | ORAL | 0 refills | Status: AC
Start: 2018-02-13 — End: ?

## 2018-02-13 MED ORDER — BENZONATATE 100 MG CAP
100 mg | ORAL | Status: AC
Start: 2018-02-13 — End: 2018-02-13
  Administered 2018-02-13: 10:00:00 via ORAL

## 2018-02-13 MED FILL — BENZONATATE 100 MG CAP: 100 mg | ORAL | Qty: 2

## 2018-02-13 NOTE — ED Triage Notes (Signed)
Pt C/O cough since Thursday and States " I am now coughing up what looks like streaks of blood and sometimes a brown substance."

## 2018-02-13 NOTE — ED Provider Notes (Signed)
EMERGENCY DEPARTMENT HISTORY AND PHYSICAL EXAM    Date: 02/13/2018  Patient Name: Molly [name]    History of Presenting Illness     Chief Complaint   Patient presents with   ??? Cough         HPI:   7:06 AM  Molly [name] is a 62 y.o. female with PMHX of multiple medical problems including diabetes, asthma, sleep apnea, hypertension, and hyperlipidemia who presents to the emergency department C/O of productive cough of brownish sputum and occasionally sputum with streaks of blood over the last 4 days. Pt denies shortness of breath or chest pain, and any other sxs or complaints.     PCP: Other, Phys, MD    Current Outpatient Medications   Medication Sig Dispense Refill   ??? azithromycin (ZITHROMAX Z-PAK) 250 mg tablet Take two tablets today then one tablet daily 6 Tab 0   ??? benzonatate (TESSALON) 200 mg capsule Take 1 Cap by mouth three (3) times daily as needed for Cough for up to 7 days. 20 Cap 0   ??? ascorbic acid, vitamin C, (VITAMIN C) 500 mg tablet Take 1,000 mg by mouth daily.     ??? meloxicam (MOBIC) 7.5 mg tablet Take 15 mg by mouth two (2) times a day. Indications: Joint Damage causing Pain and Loss of Function     ??? cyanocobalamin (VITAMIN B-12) 1,000 mcg/mL injection 1,000 mcg by IntraMUSCular route every thirty (30) days.     ??? carvedilol (COREG) 12.5 mg tablet Take 12.5 mg by mouth two (2) times a day. Indications: hypertension     ??? solifenacin (VESICARE) 10 mg tablet Take 10 mg by mouth daily. Indications: Bladder Hyperactivity, URINARY URGENCY     ??? magnesium oxide (MAG-OX) 400 mg tablet Take 400 mg by mouth daily.     ??? VITAMIN B-6 50 mg tablet 50 mg daily.     ??? telmisartan-hydroCHLOROthiazide (MICARDIS HCT) 80-25 mg per tablet 1 Tab daily. Indications: hypertension     ??? loratadine (CLARITIN) 10 mg tablet Take 10 mg by mouth daily as needed for Allergies. Indications: Allergic Rhinitis     ??? OTHER 84 mg. Indications: herbal potassium      ??? fluticasone-salmeterol (ADVAIR) 250-50 mcg/dose diskus inhaler Take 1 Puff by inhalation daily. Indications: MAINTENANCE THERAPY FOR ASTHMA     ??? NIFEdipine ER (ADALAT CC) 30 mg ER tablet Take 30 mg by mouth daily. Indications: hypertension     ??? metFORMIN (GLUCOPHAGE) 500 mg tablet Take 500 mg by mouth daily (with breakfast).     ??? OTHER Take 1 Tab by mouth daily.         Past History     Past Medical History:  Past Medical History:   Diagnosis Date   ??? Adverse effect of anesthesia     hard to awaken   ??? Arthritis    ??? Asthma    ??? Bronchitis    ??? Constipation    ??? Diabetes ([place]) 2010   ??? Hyperlipidemia    ??? Hypertension 1991   ??? Osteoarthritis of right hip 02/04/2017   ??? Palpitations    ??? Pneumonia    ??? Sleep apnea     mouthpiece   ??? Thyroid disease     nodules   ??? Urinary urgency        Past Surgical History:  Past Surgical History:   Procedure Laterality Date   ??? ABDOMEN SURGERY PROC UNLISTED     ??? HX CHOLECYSTECTOMY     ???  HX GI      abd laparoscopy   ??? HX HYSTERECTOMY     ??? HX ORTHOPAEDIC      right knee arthroscopy   ??? HX ORTHOPAEDIC  01/2018    right hip replacement       Family History:  Family History   Problem Relation Age of Onset   ??? Arrhythmia Mother    ??? Heart Attack Mother    ??? Coronary Artery Disease Maternal Uncle    ??? Heart Attack Maternal Uncle        Social History:  Social History     Tobacco Use   ??? Smoking status: Never Smoker   ??? Smokeless tobacco: Never Used   Substance Use Topics   ??? Alcohol use: No   ??? Drug use: No       Allergies:  Allergies   Allergen Reactions   ??? Entex [Phenylephrine-Guaifenesin] Rash         Review of Systems   Review of Systems   Constitutional: Negative for chills and fever.   HENT: Negative for congestion and sore throat.    Respiratory: Positive for cough. Negative for chest tightness, shortness of breath, wheezing and stridor.    Cardiovascular: Negative for chest pain.   Gastrointestinal: Negative for abdominal distention, nausea and vomiting.    Genitourinary: Negative for difficulty urinating, dysuria, flank pain, frequency, hematuria, urgency, vaginal bleeding and vaginal discharge.   Musculoskeletal: Negative for arthralgias and joint swelling.   Skin: Negative for rash and wound.   Neurological: Negative for dizziness, weakness, light-headedness and headaches.   Hematological: Negative for adenopathy.         Physical Exam     Vitals:    02/13/18 0439 02/13/18 0538 02/13/18 0700   BP: 115/60 116/55 114/59   Pulse: 77  69   Resp: 18  16   Temp: 98.9 ??F (37.2 ??C)     SpO2: 97% 95% 94%   Weight: 98.4 kg (217 lb)     Height: 5' 2" (1.575 m)       Physical Exam   Constitutional: She is oriented to person, place, and time. She appears well-developed and well-nourished. No distress.   HENT:   Head: Normocephalic and atraumatic.   Right Ear: External ear normal. No swelling or tenderness. Tympanic membrane is not perforated, not erythematous and not bulging.   Left Ear: External ear normal. No swelling or tenderness. Tympanic membrane is not perforated, not erythematous and not bulging.   Nose: Nose normal. No mucosal edema or rhinorrhea. Right sinus exhibits no maxillary sinus tenderness and no frontal sinus tenderness. Left sinus exhibits no maxillary sinus tenderness and no frontal sinus tenderness.   Mouth/Throat: Uvula is midline, oropharynx is clear and moist and mucous membranes are normal. No oral lesions. No trismus in the jaw. No dental abscesses or uvula swelling. No oropharyngeal exudate, posterior oropharyngeal edema, posterior oropharyngeal erythema or tonsillar abscesses.   Eyes: Conjunctivae are normal. Right eye exhibits no discharge. Left eye exhibits no discharge. No scleral icterus.   Neck: Normal range of motion. Neck supple.   Cardiovascular: Normal rate, regular rhythm, normal heart sounds and intact distal pulses. Exam reveals no gallop and no friction rub.   No murmur heard.   Pulmonary/Chest: Effort normal and breath sounds normal. No accessory muscle usage. No tachypnea. No respiratory distress. She has no decreased breath sounds. She has no wheezes. She has no rhonchi. She has no rales.   Abdominal: Soft.  Musculoskeletal: Normal range of motion. She exhibits no edema or tenderness.   Lymphadenopathy:     She has no cervical adenopathy.   Neurological: She is alert and oriented to person, place, and time.   Skin: Skin is warm and dry. No rash noted. She is not diaphoretic. No erythema.   Psychiatric: She has a normal mood and affect. Judgment normal.   Nursing note and vitals reviewed.    7:11 AM      Diagnostic Study Results     Labs -     Recent Results (from the past 12 hour(s))   CBC WITH AUTOMATED DIFF    Collection Time: 02/13/18  5:35 AM   Result Value Ref Range    WBC 5.4 4.6 - 13.2 K/uL    RBC 4.81 4.20 - 5.30 M/uL    HGB 12.0 12.0 - 16.0 g/dL    HCT 38.4 35.0 - 45.0 %    MCV 79.8 74.0 - 97.0 FL    MCH 24.9 24.0 - 34.0 PG    MCHC 31.3 31.0 - 37.0 g/dL    RDW 16.9 (H) 11.6 - 14.5 %    PLATELET 173 135 - 420 K/uL    MPV 12.2 (H) 9.2 - 11.8 FL    NEUTROPHILS 47 40 - 73 %    LYMPHOCYTES 30 21 - 52 %    MONOCYTES 21 (H) 3 - 10 %    EOSINOPHILS 1 0 - 5 %    BASOPHILS 1 0 - 2 %    ABS. NEUTROPHILS 2.6 1.8 - 8.0 K/UL    ABS. LYMPHOCYTES 1.6 0.9 - 3.6 K/UL    ABS. MONOCYTES 1.1 0.05 - 1.2 K/UL    ABS. EOSINOPHILS 0.0 0.0 - 0.4 K/UL    ABS. BASOPHILS 0.1 0.0 - 0.1 K/UL    DF AUTOMATED     METABOLIC PANEL, COMPREHENSIVE    Collection Time: 02/13/18  5:35 AM   Result Value Ref Range    Sodium 140 136 - 145 mmol/L    Potassium 3.2 (L) 3.5 - 5.5 mmol/L    Chloride 103 100 - 108 mmol/L    CO2 31 21 - 32 mmol/L    Anion gap 6 3.0 - 18 mmol/L    Glucose 96 74 - 99 mg/dL    BUN 17 7.0 - 18 MG/DL    Creatinine 0.97 0.6 - 1.3 MG/DL    BUN/Creatinine ratio 18 12 - 20      GFR est AA >60 >60 ml/min/1.94m    GFR est non-AA 58 (L) >60 ml/min/1.746m   Calcium 8.8 8.5 - 10.1 MG/DL     Bilirubin, total 0.5 0.2 - 1.0 MG/DL    ALT (SGPT) 21 13 - 56 U/L    AST (SGOT) 23 15 - 37 U/L    Alk. phosphatase 109 45 - 117 U/L    Protein, total 6.8 6.4 - 8.2 g/dL    Albumin 3.6 3.4 - 5.0 g/dL    Globulin 3.2 2.0 - 4.0 g/dL    A-G Ratio 1.1 0.8 - 1.7     CARDIAC PANEL,(CK, CKMB & TROPONIN)    Collection Time: 02/13/18  5:35 AM   Result Value Ref Range    CK 649 (H) 26 - 192 U/L    CK - MB <1.0 <3.6 ng/ml    CK-MB Index  0.0 - 4.0 %     CALCULATION NOT PERFORMED WHEN RESULT IS BELOW LINEAR LIMIT    Troponin-I, QT <  0.02 0.0 - 0.045 NG/ML   NT-PRO BNP    Collection Time: 02/13/18  5:35 AM   Result Value Ref Range    NT pro-BNP 85 0 - 900 PG/ML   EKG, 12 LEAD, INITIAL    Collection Time: 02/13/18  5:35 AM   Result Value Ref Range    Ventricular Rate 68 BPM    Atrial Rate 68 BPM    P-R Interval 162 ms    QRS Duration 82 ms    Q-T Interval 412 ms    QTC Calculation (Bezet) 438 ms    Calculated P Axis 23 degrees    Calculated R Axis 9 degrees    Calculated T Axis -16 degrees    Diagnosis       Normal sinus rhythm  Minimal voltage criteria for LVH, may be normal variant  Nonspecific T wave abnormality  Abnormal ECG  When compared with ECG of 05-May-2017 08:22,  No significant change was found         Radiologic Studies - chest X-ray  NAD  XR CHEST PA LAT    (Results Pending)     CT Results  (Last 48 hours)    None        CXR Results  (Last 48 hours)    None          Medications given in the ED-  Medications   benzonatate (TESSALON) capsule 200 mg (200 mg Oral Given 02/13/18 0543)         Medical Decision Making   I am the first provider for this patient.    I reviewed the vital signs, available nursing notes, past medical history, past surgical history, family history and social history.    Vital Signs-Reviewed the patient's vital signs.    Pulse Oximetry Analysis - 95% on RA     Cardiac Monitor:  Rate: 65 bpm  Rhythm: Sinus    EKG interpretation: (Preliminary)  7:06 AM   NSR, Rate 68, Nonspecific ST-T       Records Reviewed: Nursing Notes and Old Medical Records    Provider Notes (Medical Decision Making):   Patient presents with cough and hemoptysis.  She has a history of asthma and bronchitis and this most likely is colitis however need to rule out pneumonia, reactive airway disease, bronchitis, CHF, PE, viral illness, pleural effusion    Procedures:  Procedures    ED Course:   7:06 AM Initial assessment performed. The patients presenting problems have been discussed, and they are in agreement with the care plan formulated and outlined with them.  I have encouraged them to ask questions as they arise throughout their visit.    Diagnosis and Disposition       DISCHARGE NOTE:    7:18 AM   Molly [name]  results have been reviewed with her.  She has been counseled regarding her diagnosis, treatment, and plan.  She verbally conveys understanding and agreement of the signs, symptoms, diagnosis, treatment and prognosis and additionally agrees to follow up as discussed.  She also agrees with the care-plan and conveys that all of her questions have been answered.  I have also provided discharge instructions for her that include: educational information regarding their diagnosis and treatment, and list of reasons why they would want to return to the ED prior to their follow-up appointment, should her condition change. She has been provided with education for proper emergency department utilization.     CLINICAL IMPRESSION:    1. Acute  bronchitis, unspecified organism        PLAN:  1. D/C Home  2.   Current Discharge Medication List      START taking these medications    Details   azithromycin (ZITHROMAX Z-PAK) 250 mg tablet Take two tablets today then one tablet daily  Qty: 6 Tab, Refills: 0      benzonatate (TESSALON) 200 mg capsule Take 1 Cap by mouth three (3) times daily as needed for Cough for up to 7 days.  Qty: 20 Cap, Refills: 0           3.   Follow-up Information      Follow up With Specialties Details Why Contact Info    Other, Phys, MD    Patient can only remember the practice name and not the physician      [facility] EMERGENCY DEPT Emergency Medicine  As needed [address]  [place] [address]  [phone]

## 2018-02-13 NOTE — ED Notes (Signed)
I have reviewed discharge instructions with the patient.  The patient verbalized understanding. Patient armband removed and shredded

## 2018-02-13 NOTE — ED Notes (Signed)
Report given to S. Glasgow, RN.

## 2018-04-06 LAB — EKG 12-LEAD
Atrial Rate: 68 {beats}/min
Diagnosis: NORMAL
P Axis: 23 degrees
P-R Interval: 162 ms
Q-T Interval: 412 ms
QRS Duration: 82 ms
QTc Calculation (Bazett): 438 ms
R Axis: 9 degrees
T Axis: -16 degrees
Ventricular Rate: 68 {beats}/min

## 2018-04-06 LAB — EKG, 12 LEAD, INITIAL
Atrial Rate: 68 {beats}/min
Calculated P Axis: 23 degrees
Calculated R Axis: 9 degrees
Calculated T Axis: -16 degrees
Diagnosis: NORMAL
P-R Interval: 162 ms
Q-T Interval: 412 ms
QRS Duration: 82 ms
QTC Calculation (Bezet): 438 ms
Ventricular Rate: 68 {beats}/min

## 2018-08-26 DIAGNOSIS — M791 Myalgia, unspecified site: Secondary | ICD-10-CM | POA: Insufficient documentation

## 2018-09-14 DIAGNOSIS — R200 Anesthesia of skin: Secondary | ICD-10-CM | POA: Insufficient documentation

## 2018-09-14 DIAGNOSIS — M79641 Pain in right hand: Secondary | ICD-10-CM | POA: Insufficient documentation

## 2019-08-03 DIAGNOSIS — H9313 Tinnitus, bilateral: Secondary | ICD-10-CM | POA: Insufficient documentation

## 2019-08-03 DIAGNOSIS — R519 Headache, unspecified: Secondary | ICD-10-CM | POA: Insufficient documentation

## 2019-08-16 ENCOUNTER — Other Ambulatory Visit: Payer: Self-pay | Admitting: Internal Medicine

## 2019-08-16 DIAGNOSIS — Z1231 Encounter for screening mammogram for malignant neoplasm of breast: Secondary | ICD-10-CM

## 2019-09-21 ENCOUNTER — Ambulatory Visit
Admission: RE | Admit: 2019-09-21 | Discharge: 2019-09-21 | Disposition: A | Discharge status: Home / Self Care | Source: Ambulatory Visit | Attending: Internal Medicine | Admitting: Internal Medicine

## 2019-09-21 DIAGNOSIS — Z1231 Encounter for screening mammogram for malignant neoplasm of breast: Secondary | ICD-10-CM | POA: Diagnosis not present

## 2019-09-25 ENCOUNTER — Encounter: Payer: Self-pay | Admitting: Emergency Medicine

## 2019-09-25 ENCOUNTER — Other Ambulatory Visit: Payer: Self-pay

## 2019-09-25 DIAGNOSIS — E039 Hypothyroidism, unspecified: Secondary | ICD-10-CM | POA: Diagnosis not present

## 2019-09-25 DIAGNOSIS — E119 Type 2 diabetes mellitus without complications: Secondary | ICD-10-CM | POA: Insufficient documentation

## 2019-09-25 DIAGNOSIS — Z96641 Presence of right artificial hip joint: Secondary | ICD-10-CM | POA: Diagnosis not present

## 2019-09-25 DIAGNOSIS — Z79899 Other long term (current) drug therapy: Secondary | ICD-10-CM | POA: Insufficient documentation

## 2019-09-25 DIAGNOSIS — J45909 Unspecified asthma, uncomplicated: Secondary | ICD-10-CM | POA: Diagnosis not present

## 2019-09-25 DIAGNOSIS — R519 Headache, unspecified: Secondary | ICD-10-CM | POA: Diagnosis not present

## 2019-09-25 DIAGNOSIS — I10 Essential (primary) hypertension: Secondary | ICD-10-CM | POA: Diagnosis not present

## 2019-09-25 DIAGNOSIS — Z7984 Long term (current) use of oral hypoglycemic drugs: Secondary | ICD-10-CM | POA: Insufficient documentation

## 2019-09-25 LAB — BASIC METABOLIC PANEL
Anion gap: 5 (ref 5–15)
BUN: 21 mg/dL (ref 8–23)
CO2: 30 mmol/L (ref 22–32)
Calcium: 9.4 mg/dL (ref 8.9–10.3)
Chloride: 102 mmol/L (ref 98–111)
Creatinine, Ser: 0.89 mg/dL (ref 0.44–1.00)
GFR calc Af Amer: >60 mL/min (ref >60)
GFR calc non Af Amer: >60 mL/min (ref >60)
Glucose, Bld: 95 mg/dL (ref 70–99)
Potassium: 3.4 mmol/L — ABNORMAL LOW (ref 3.5–5.1)
Sodium: 137 mmol/L (ref 135–145)

## 2019-09-25 LAB — CBC
HCT: 41 % (ref 36.0–46.0)
Hemoglobin: 12.4 g/dL (ref 12.0–15.0)
MCH: 24.4 pg — ABNORMAL LOW (ref 26.0–34.0)
MCHC: 30.2 g/dL (ref 30.0–36.0)
MCV: 80.6 fL (ref 80.0–100.0)
Platelets: 164 10*3/uL (ref 150–400)
RBC: 5.09 MIL/uL (ref 3.87–5.11)
RDW: 16.6 % — ABNORMAL HIGH (ref 11.5–15.5)
WBC: 4.8 10*3/uL (ref 4.0–10.5)
nRBC: 0 % (ref 0.0–0.2)

## 2019-09-25 LAB — TROPONIN I (HIGH SENSITIVITY): Troponin I (High Sensitivity): 14 ng/L (ref <18)

## 2019-09-25 NOTE — ED Triage Notes (Signed)
Pt arrived via POV with reports of elevated BP, pt states BP was in the 170s/100s tonight. Pt takes BP medication in the morning and at night but did not take night time dose yet.   Pt only c/o headache which she states she has had for a while off and on. Pt states she takes her BP daily.

## 2019-09-25 NOTE — ED Notes (Signed)
Urine sent to lab with save label 

## 2019-09-26 ENCOUNTER — Emergency Department
Admission: EM | Admit: 2019-09-26 | Discharge: 2019-09-26 | Disposition: A | Discharge status: Home / Self Care | Attending: Emergency Medicine | Admitting: Emergency Medicine

## 2019-09-26 DIAGNOSIS — R519 Headache, unspecified: Secondary | ICD-10-CM

## 2019-09-26 DIAGNOSIS — I10 Essential (primary) hypertension: Secondary | ICD-10-CM

## 2019-09-26 NOTE — ED Provider Notes (Signed)
[facility] Emergency Department Provider Note  ____________________________________________   First MD Initiated Contact with Patient 09/26/19 0037     (approximate)  I have reviewed the triage vital signs and the nursing notes.   HISTORY  Chief Complaint Hypertension    HPI Christina Mcclain is a 63 y.o. female with medical history as listed below who presents for evaluation of elevated blood pressure.  She has had intermittent headaches for "a while now" (at least weeks if not months) for which she sees Dr. Melrose Nakayama with neurology.  She is having a throbbing headache today similar to her chronic headaches.  She checked her blood pressure this afternoon and it was elevated in the 170/100 range.  She checked it  a few more times and is seem to keep going up and up.  It was consistently over [id number] systolic in the emergency department although the diastolic has been around 80.  Her headache does not get worse when her blood pressure goes up.  She has not had any chest pain.  She denies fever/chills, sore throat, chest pain, shortness of breath, cough, nausea, vomiting, and abdominal pain.  She has been eating and drinking normally and has had no change in her urinary function.  She has been waiting for 5 hours in the waiting room and has not had her evening medication which is carvedilol 12.5 mg by mouth.  She also reports that she no longer takes Procardia XL 30 mg daily, but is still taking the Micardis as listed in her medications.        Past Medical History:  Diagnosis Date   Adverse effect of anesthesia    hard to awaken   Anemia    Arthritis    Asthma    Asthma without status asthmaticus    unspecified; Take advair each night   Bronchitis    Constipation    Diabetes ([place]) 2010   Diabetes mellitus type 2, uncomplicated ([place])    Diabetes mellitus without complication ([place])    Encounter for blood transfusion    hysterectomy 9-10 years ago    Heart murmur    unspecified   Hyperlipidemia    Hypertension 1991   Hypothyroid    Multiple thyroid nodules    Osteoarthritis of right hip 02/04/2017   Palpitations    Pernicious anemia    Pneumonia    Sleep apnea    Use C-PAP; can't use mouth piece. Anxiety   Thyroid disease    nodules   Thyroid nodule    x2   Urinary urgency     Patient Active Problem List   Diagnosis Date Noted   Primary localized osteoarthrosis of left hip 06/23/2017    Past Surgical History:  Procedure Laterality Date   ABDOMINAL HYSTERECTOMY  2011   partial   ABDOMINAL SURGERY     CARPAL TUNNEL RELEASE Left 2008   CARPAL TUNNEL RELEASE Right    CHOLECYSTECTOMY  1982   COLONOSCOPY WITH PROPOFOL N/A 10/05/2017   Procedure: COLONOSCOPY WITH PROPOFOL;  Surgeon: Manya Silvas, MD;  Location: [facility] ENDOSCOPY;  Service: Endoscopy;  Laterality: N/A;   DIAGNOSTIC LAPAROSCOPY     JOINT REPLACEMENT Right 02/09/2017   TOTAL HIP REPLACEMENT, DR. CARTER. [place]   KNEE ARTHROSCOPY  1990s   TOTAL HIP ARTHROPLASTY Left 06/23/2017   Procedure: TOTAL HIP ARTHROPLASTY ANTERIOR APPROACH;  Surgeon: Hessie Knows, MD;  Location: [facility] ORS;  Service: Orthopedics;  Laterality: Left;    Prior to Admission medications  Medication Sig Start Date End Date Taking? Authorizing Provider  albuterol (PROVENTIL HFA;VENTOLIN HFA) 108 (90 Base) MCG/ACT inhaler Inhale 2 puffs into the lungs every 6 (six) hours as needed for wheezing or shortness of breath.    [provider]  alum & mag hydroxide-simeth (MAALOX MAX) 400-400-40 MG/5ML suspension Take 5 mLs by mouth every 6 (six) hours as needed for indigestion. 12/04/17   Rudene Re, MD  Ascorbic Acid (VITAMIN C) 1000 MG tablet Take 1,000 mg by mouth daily.    [provider]  Calcium Carb-Cholecalciferol (CALCIUM 500+D3) 500-400 MG-UNIT TABS Take 1 tablet by mouth daily.    [provider]  carvedilol (COREG) 12.5 MG tablet  Take 12.5 mg by mouth 2 (two) times daily.     [provider]  cholecalciferol (VITAMIN D) 1000 units tablet Take 1,000 Units by mouth daily.    [provider]  Cyanocobalamin (B-12 COMPLIANCE INJECTION IJ) Inject 1 Dose as directed every 30 (thirty) days. On 5th day of Month    [provider]  enoxaparin (LOVENOX) 40 MG/0.4ML injection Inject 0.4 mLs (40 mg total) into the skin daily. Patient not taking: Reported on 10/05/2017 06/25/17   Reche Dixon, PA-C  famotidine (PEPCID) 20 MG tablet Take 1 tablet (20 mg total) by mouth 2 (two) times daily. 12/04/17 12/04/18  Rudene Re, MD  Fluticasone-Salmeterol (ADVAIR) 250-50 MCG/DOSE AEPB Inhale 1 puff into the lungs daily.     [provider]  loratadine (CLARITIN) 10 MG tablet Take 10 mg by mouth daily as needed for allergies.    [provider]  magnesium oxide (MAG-OX) 400 MG tablet Take 400 mg by mouth daily.    [provider]  meloxicam (MOBIC) 7.5 MG tablet Take 7.5 mg by mouth daily.     [provider]  metFORMIN (GLUCOPHAGE) 500 MG tablet Take 500 mg by mouth daily.     [provider]  NIFEdipine (PROCARDIA-XL/ADALAT CC) 30 MG 24 hr tablet Take 30 mg by mouth daily.    [provider]  oxyCODONE (OXY IR/ROXICODONE) 5 MG immediate release tablet Take 1-2 tablets (5-10 mg total) by mouth every 4 (four) hours as needed for breakthrough pain. Patient not taking: Reported on 10/05/2017 06/25/17   Reche Dixon, PA-C  Polyethyl Glycol-Propyl Glycol (SYSTANE) 0.4-0.3 % SOLN Apply 1 drop to eye daily as needed.    [provider]  pyridOXINE (VITAMIN B-6) 50 MG tablet Take 50 mg by mouth daily. 05/04/17   [provider]  solifenacin (VESICARE) 10 MG tablet Take 10 mg by mouth daily.     [provider]  telmisartan-hydrochlorothiazide (MICARDIS HCT) 80-25 MG tablet Take 1 tablet by mouth daily.     [provider]  traMADol (ULTRAM)  50 MG tablet Take 1 tablet (50 mg total) by mouth every 6 (six) hours as needed. Patient not taking: Reported on 10/05/2017 06/26/17   Toni Arthurs, NP    Allergies Statins, Entex t [pseudoephedrine-guaifenesin], and Phenylephrine-guaifenesin  Family History  Problem Relation Age of Onset   Heart disease Mother    Hypertension Mother    Arrhythmia Mother    Heart attack Mother    Diabetes Mother    Hypertension Father    Parkinson's disease Father    Coronary artery disease Maternal Uncle    Heart attack Maternal Uncle    Diabetes Sister    Diabetes Brother    Heart disease Brother    Breast cancer Neg Hx  Social History Social History   Tobacco Use   Smoking status: Never Smoker   Smokeless tobacco: Never Used  Substance Use Topics   Alcohol use: No   Drug use: No    Review of Systems Constitutional: Hypertension.  No fever/chills Eyes: No visual changes. ENT: No sore throat. Cardiovascular: Denies chest pain. Respiratory: Denies shortness of breath. Gastrointestinal: No abdominal pain.  No nausea, no vomiting.  No diarrhea.  No constipation. Genitourinary: Negative for dysuria. Musculoskeletal: Negative for neck pain.  Negative for back pain. Integumentary: Negative for rash. Neurological: Moderate throbbing headache similar to her prior chronic headaches.  No numbness nor weakness in her extremities.   ____________________________________________   PHYSICAL EXAM:  VITAL SIGNS: ED Triage Vitals [09/25/19 1922]  Enc Vitals Group     BP (!) 219/82     Pulse Rate 64     Resp 18     Temp 98.2 F (36.8 C)     Temp Source Oral     SpO2 100 %     Weight 96.6 kg (213 lb)     Height 1.575 m (5\' 2" )     Head Circumference      Peak Flow      Pain Score 6     Pain Loc      Pain Edu?      Excl. in [place]?     Constitutional: Alert and oriented.  Well-appearing and in no distress. Eyes: Conjunctivae are normal.  Head: Atraumatic.  No  tenderness to palpation at either temple. Nose: No congestion/rhinnorhea. Mouth/Throat: Patient is wearing a mask. Neck: No stridor.  No meningeal signs.   Cardiovascular: Normal rate, regular rhythm. Good peripheral circulation. Grossly normal heart sounds. Respiratory: Normal respiratory effort.  No retractions. Gastrointestinal: Soft and nontender. No distention.  Musculoskeletal: No lower extremity tenderness nor edema. No gross deformities of extremities. Neurologic:  Normal speech and language. No gross focal neurologic deficits are appreciated.  Skin:  Skin is warm, dry and intact. Psychiatric: Mood and affect are normal. Speech and behavior are normal.  Patient is very appropriate, and in no distress, very understanding in spite of the long wait in the emergency department.  ____________________________________________   LABS (all labs ordered are listed, but only abnormal results are displayed)  Labs Reviewed  CBC - Abnormal; Notable for the following components:      Result Value   MCH 24.4 (*)    RDW 16.6 (*)    All other components within normal limits  BASIC METABOLIC PANEL - Abnormal; Notable for the following components:   Potassium 3.4 (*)    All other components within normal limits  TROPONIN I (HIGH SENSITIVITY)   ____________________________________________  EKG  ED ECG REPORT I, Hinda Kehr, the attending physician, personally viewed and interpreted this ECG.  Date: 09/25/2019 EKG Time: 19: 33 Rate: 63 Rhythm: normal sinus rhythm QRS Axis: normal Intervals: normal, borderline criteria for LVH but not definitive. ST/T Wave abnormalities: normal Narrative Interpretation: no evidence of acute ischemia  ____________________________________________  RADIOLOGY I, Hinda Kehr, personally viewed and evaluated these images (plain radiographs) as part of my medical decision making, as well as reviewing the written report by the radiologist.  ED MD  interpretation: No indication for emergent imaging  Official radiology report(s): No results found.  ____________________________________________   PROCEDURES   Procedure(s) performed (including Critical Care):  Procedures   ____________________________________________   INITIAL IMPRESSION / MDM / ASSESSMENT AND PLAN / ED COURSE  As part of  my medical decision making, I reviewed the following data within the [place] notes reviewed and incorporated, Labs reviewed , EKG interpreted , Old chart reviewed and Notes from prior ED visits   Differential diagnosis includes, but is not limited to, asymptomatic essential hypertension with chronic headaches, temporal arteritis, subarachnoid hemorrhage, subdural, electrolyte or metabolic abnormality, hypertensive urgency/emergency leading to renal dysfunction.  The patient has had no chest pain at all.  Her high-sensitivity troponin is slightly elevated from baseline but she is having no chest pain and in my opinion would not benefit from a repeat troponin.  Vital signs are stable other than the hypertension.  We had my usual and customary hypertension discussion including why I do not want to drastically or rapidly drop her blood pressure.  Her lab work is reassuring other than a very slightly decreased potassium which is likely dietary.  We discussed how she should take her evening carvedilol and I suggested taking a Procardia as well for some additional hypertension management but she has them at home so she declined taking a dose in the ED which I think is appropriate.  The current plan is for her to take her carvedilol and one of the Procardia XL when she gets home, then take her usual morning medications and touch base with her PCP to schedule follow-up.  Her headache is no worse than baseline and I believe that is secondary to her chronic headaches for which she already sees a neurologist.  She has no physical exam/neuro  exam findings nor history to suggest an intracranial bleed nor temporal arteritis and I do not feel she would benefit from additional imaging at this time.  The patient understands and agrees with the plan for discharge and outpatient follow-up.  I also discussed the plan with her husband who is at bedside.          ____________________________________________  FINAL CLINICAL IMPRESSION(S) / ED DIAGNOSES  Final diagnoses:  Essential hypertension  Generalized headache     MEDICATIONS GIVEN DURING THIS VISIT:  Medications - No data to display   ED Discharge Orders    None      *Please note:  Demoni Lawton was evaluated in Emergency Department on 09/26/2019 for the symptoms described in the history of present illness. She was evaluated in the context of the global COVID-19 pandemic, which necessitated consideration that the patient might be at risk for infection with the SARS-CoV-2 virus that causes COVID-19. Institutional protocols and algorithms that pertain to the evaluation of patients at risk for COVID-19 are in a state of rapid change based on information released by regulatory bodies including the CDC and federal and state organizations. These policies and algorithms were followed during the patient's care in the ED.  Some ED evaluations and interventions may be delayed as a result of limited staffing during the pandemic.*  Note:  This document was prepared using Dragon voice recognition software and may include unintentional dictation errors.   Hinda Kehr, MD 09/26/19 [id number]

## 2019-09-26 NOTE — Discharge Instructions (Signed)
As we discussed, though you do have high blood pressure (hypertension), fortunately it is not immediately dangerous at this time and does not need emergency intervention or admission to the hospital.  If we add to or change your regular medications, we may cause more harm than good - it is more appropriate for your primary care doctor to evaluate you in clinic and decide if any medication changes are needed.  Please follow up in clinic as recommended in these papers.    As we discussed, when you get home tonight, I recommend that you take your evening dose of carvedilol, as well as one of the Procardia XL (nifedipine) 30 mg tablets that you still have at home.  I suggest you do not check your pressure again tonight, take your regular morning medications, and then check your blood pressure sometime around noon to see how it is doing.  Call and schedule follow-up appointment with Dr. Ouida Sills and explain that your blood pressure has been elevated and that you came to the emergency department but that your lab work looked good.  Return to the Emergency Department (ED) if you experience any worsening chest pain/pressure/tightness, difficulty breathing, or sudden sweating, or other symptoms that concern you.

## 2019-10-10 ENCOUNTER — Other Ambulatory Visit: Payer: Self-pay | Admitting: Internal Medicine

## 2019-10-10 DIAGNOSIS — R928 Other abnormal and inconclusive findings on diagnostic imaging of breast: Secondary | ICD-10-CM

## 2019-10-14 ENCOUNTER — Ambulatory Visit
Admission: RE | Admit: 2019-10-14 | Discharge: 2019-10-14 | Disposition: A | Discharge status: Home / Self Care | Source: Ambulatory Visit | Attending: Internal Medicine | Admitting: Internal Medicine

## 2019-10-14 DIAGNOSIS — R928 Other abnormal and inconclusive findings on diagnostic imaging of breast: Secondary | ICD-10-CM | POA: Diagnosis not present

## 2019-10-17 ENCOUNTER — Other Ambulatory Visit: Payer: Self-pay | Admitting: Internal Medicine

## 2019-10-17 DIAGNOSIS — N6310 Unspecified lump in the right breast, unspecified quadrant: Secondary | ICD-10-CM

## 2019-10-17 DIAGNOSIS — R928 Other abnormal and inconclusive findings on diagnostic imaging of breast: Secondary | ICD-10-CM

## 2019-10-19 ENCOUNTER — Ambulatory Visit
Admission: RE | Admit: 2019-10-19 | Discharge: 2019-10-19 | Disposition: A | Discharge status: Home / Self Care | Source: Ambulatory Visit | Attending: Internal Medicine | Admitting: Internal Medicine

## 2019-10-19 DIAGNOSIS — N6310 Unspecified lump in the right breast, unspecified quadrant: Secondary | ICD-10-CM

## 2019-10-19 DIAGNOSIS — R928 Other abnormal and inconclusive findings on diagnostic imaging of breast: Secondary | ICD-10-CM

## 2019-10-19 HISTORY — PX: BREAST BIOPSY: SHX20

## 2019-10-25 ENCOUNTER — Other Ambulatory Visit: Payer: Self-pay | Admitting: Pathology

## 2019-10-25 ENCOUNTER — Other Ambulatory Visit: Payer: Self-pay

## 2019-10-25 DIAGNOSIS — C50919 Malignant neoplasm of unspecified site of unspecified female breast: Secondary | ICD-10-CM

## 2019-10-25 LAB — SURGICAL PATHOLOGY

## 2019-10-26 ENCOUNTER — Other Ambulatory Visit: Payer: Self-pay

## 2019-10-26 NOTE — Progress Notes (Signed)
  Oncology Nurse Navigator Documentation  Navigator Location: [facility] (10/26/19 1000) Referral Date to RadOnc/MedOnc: 10/28/19 (10/26/19 1000) )Navigator Encounter Type: Introductory Phone Call (10/26/19 1000)   Abnormal Finding Date: 10/14/19 (10/26/19 1000) Confirmed Diagnosis Date: 11/18/19 (10/26/19 1000)               Patient Visit Type: Initial (10/26/19 1000) Treatment Phase: Pre-Tx/Tx Discussion (10/26/19 1000) Barriers/Navigation Needs: Anxiety;Coordination of Care;Education (10/26/19 1000) Education: Accessing Care/ Finding Providers;Coping with Diagnosis/ Prognosis;Newly Diagnosed Cancer Education (10/26/19 1000) Interventions: Coordination of Care;Education;Psycho-Social Support (10/26/19 1000)   Coordination of Care: Appts (10/26/19 1000)                  Time Spent with Patient: 60 (10/26/19 1000)   Introduced to [employer].  Given Breast Cancer Treatment Handbook/folder with hospital services.  Patient is scheduled with Dr. Peyton Najjar  On 10/27/19 , and Dr. Janese Banks on 10/28/2019 per her request.  She moved here from [facility] one year ago to be close to family.  States she has good family support.  Denies any family history of cancer.

## 2019-10-28 ENCOUNTER — Inpatient Hospital Stay: Attending: Oncology | Admitting: Oncology

## 2019-10-28 ENCOUNTER — Other Ambulatory Visit: Payer: Self-pay

## 2019-10-28 VITALS — BP 134/61 | HR 74 | Temp 97.0°F | Resp 16 | Wt 209.8 lb

## 2019-10-28 DIAGNOSIS — Z5111 Encounter for antineoplastic chemotherapy: Secondary | ICD-10-CM | POA: Diagnosis present

## 2019-10-28 DIAGNOSIS — Z5189 Encounter for other specified aftercare: Secondary | ICD-10-CM | POA: Diagnosis not present

## 2019-10-28 DIAGNOSIS — R197 Diarrhea, unspecified: Secondary | ICD-10-CM | POA: Diagnosis not present

## 2019-10-28 DIAGNOSIS — Z90710 Acquired absence of both cervix and uterus: Secondary | ICD-10-CM | POA: Diagnosis not present

## 2019-10-28 DIAGNOSIS — R531 Weakness: Secondary | ICD-10-CM | POA: Diagnosis not present

## 2019-10-28 DIAGNOSIS — Z5112 Encounter for antineoplastic immunotherapy: Secondary | ICD-10-CM | POA: Diagnosis present

## 2019-10-28 DIAGNOSIS — Z170 Estrogen receptor positive status [ER+]: Secondary | ICD-10-CM | POA: Insufficient documentation

## 2019-10-28 DIAGNOSIS — C50911 Malignant neoplasm of unspecified site of right female breast: Secondary | ICD-10-CM | POA: Insufficient documentation

## 2019-10-28 DIAGNOSIS — Z7189 Other specified counseling: Secondary | ICD-10-CM | POA: Diagnosis not present

## 2019-10-28 DIAGNOSIS — C50919 Malignant neoplasm of unspecified site of unspecified female breast: Secondary | ICD-10-CM

## 2019-10-28 DIAGNOSIS — F419 Anxiety disorder, unspecified: Secondary | ICD-10-CM | POA: Insufficient documentation

## 2019-10-28 DIAGNOSIS — E876 Hypokalemia: Secondary | ICD-10-CM | POA: Diagnosis not present

## 2019-10-28 NOTE — Research (Signed)
I met with patient and her husband today to discuss research study [employer] for the Discovery and Validation of Biomarkers for the Predication, Diagnosis and Management of Disease" sponsored by [employer].  Dr. Janese Banks introduced the study to patient.  I explained the purpose of the study, risk/benefits and patient responsibilities.  She understands that she will need to give 3 vials of whole blood to study for participation.  Patient took consent form home for review.  Patient states she is overwhelmed today and needs some time to process.  I explained that was no problem.  I will follow up with patient to learn her decision before her first chemotherapy appointment.   Lula Olszewski Clinical oncology research associate 10/28/2019 1510  I called patient today at [phone] to follow up with patient regarding study discussion from 10/28/19.  Patient is scheduled to come to cancer center tomorrow for chemo class and she has agreed to meet me after to sign consent.  Her blood for the study will not be drawn until 11/10/19 when there are already plans to access her port for her first chemotherapy treatment.  Lula Olszewski Clinical oncology research associate 11/01/2019 1338  I met with patient and her husband today after her chemotherapy education class.  I further reviewed consent form for [employer] study. I emphasized that study participation is voluntary.  I reviewed risk/benefits, purpose of the study and requirement to give 3 tubes (6 teaspoons) for study participation.  Patient asked questions she had and signed consent form and HIPAA form IRB approved on 06/23/20200.  Copy of signed forms given to patient.  I will meet with patient at her first infusion/port access appointment on 11/10/2019 to collect blood for study participation.  [place] [facility] Research associate 11/02/2019 12:13  Patient presented today for port access and lab draw for her  first chemotherapy infusion.  Research labs also collected for research study participation.  Gift card given to patient that was provided by study.  I thanked patient for her participation.  Lula Olszewski 11/10/2019 08:30 am

## 2019-10-31 ENCOUNTER — Encounter: Payer: Self-pay | Admitting: Oncology

## 2019-10-31 ENCOUNTER — Telehealth: Payer: Self-pay | Admitting: *Deleted

## 2019-10-31 DIAGNOSIS — Z7189 Other specified counseling: Secondary | ICD-10-CM | POA: Insufficient documentation

## 2019-10-31 DIAGNOSIS — C50919 Malignant neoplasm of unspecified site of unspecified female breast: Secondary | ICD-10-CM | POA: Insufficient documentation

## 2019-10-31 MED ORDER — PROCHLORPERAZINE MALEATE 10 MG PO TABS: 10.0000 mg | ORAL_TABLET | Freq: Four times a day (QID) | ORAL | 1 refills | Status: DC | PRN

## 2019-10-31 MED ORDER — LORAZEPAM 0.5 MG PO TABS: 0.5000 mg | ORAL_TABLET | Freq: Four times a day (QID) | ORAL | 0 refills | Status: DC | PRN

## 2019-10-31 MED ORDER — DEXAMETHASONE 4 MG PO TABS: 8.0000 mg | ORAL_TABLET | Freq: Two times a day (BID) | ORAL | 1 refills | Status: DC

## 2019-10-31 MED ORDER — ONDANSETRON HCL 8 MG PO TABS: 8.0000 mg | ORAL_TABLET | Freq: Two times a day (BID) | ORAL | 1 refills | Status: DC | PRN

## 2019-10-31 MED ORDER — LIDOCAINE-PRILOCAINE 2.5-2.5 % EX CREA: TOPICAL_CREAM | CUTANEOUS | 3 refills | Status: DC

## 2019-10-31 NOTE — Telephone Encounter (Signed)
Can you please look into this? Port will need to be before chemo. Can dr Peyton Najjar do it sooner?

## 2019-10-31 NOTE — Telephone Encounter (Signed)
Patient called asking about having port inserted the same day as her chemotherapy, when I looked at appts, she has port labs. She states she does not have a port  And there is no schedule for it either. Please advise

## 2019-10-31 NOTE — Progress Notes (Signed)
START ON PATHWAY REGIMEN - Breast     A cycle is every 21 days:     Pertuzumab      Pertuzumab      Trastuzumab-xxxx      Trastuzumab-xxxx      Carboplatin      Docetaxel   **Always confirm dose/schedule in your pharmacy ordering system**  Patient Characteristics: Preoperative or Nonsurgical Candidate (Clinical Staging), Neoadjuvant Therapy followed by Surgery, Invasive Disease, Chemotherapy, HER2 Positive, ER Positive Therapeutic Status: Preoperative or Nonsurgical Candidate (Clinical Staging) AJCC M Category: cM0 AJCC Grade: G3 Breast Surgical Plan: Neoadjuvant Therapy followed by Surgery ER Status: Positive (+) AJCC 8 Stage Grouping: IA HER2 Status: Positive (+) AJCC T Category: cT1 AJCC N Category: cN0 PR Status: Positive (+) Intent of Therapy: Curative Intent, Discussed with Patient

## 2019-10-31 NOTE — Progress Notes (Signed)
Hematology/Oncology Consult note [facility] [phone] [phone]  Patient Care Team: Kirk Ruths, MD as PCP - General (Internal Medicine) Theodore Demark, RN as Registered Nurse (Oncology)   Name of the patient: Christina Mcclain  [id number]  1956/04/03    Reason for referral-new diagnosis of breast cancer   Referring physician- [facility] program  Date of visit: 10/31/19   History of presenting illness- patient is a 63 year old African-American female who underwent a routine screening mammogram on 10/06/2019. It showed 2 suspicious lesions in the right breast. Diagnostic mammogram and ultrasound confirmed 2 masses in the 9 o'clock position of the right breast measuring 1.1 x 1.2 x 1.2 cm and the other one measuring 0.8 x 0.8 x 0.9 cm. These 2 masses were 1.7 cm apart. Sonographic evaluation of the right axilla did not show any enlarged adenopathy. 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   No prior abnormal mammograms or breast biopsies.  No family history of breast cancer.  Mother had cervical cancer.  Menarche at 63 years.  Menopause sometime in her 56s but she only had a hysterectomy at that time.  No prior use of HRT.  She did take OC pills for about 12 years in the remote past.  Patient lives with her husband and is independent of her ADLs and IADLs  ECOG PS- 1  Pain scale- 0   Review of systems- Review of Systems  Constitutional: Negative for chills, fever, malaise/fatigue and weight loss.  HENT: Negative for congestion, ear discharge and nosebleeds.   Eyes: Negative for blurred vision.  Respiratory: Negative for cough, hemoptysis, sputum production, shortness of breath and wheezing.   Cardiovascular: Negative for chest pain, palpitations, orthopnea and claudication.  Gastrointestinal:  Negative for abdominal pain, blood in stool, constipation, diarrhea, heartburn, melena, nausea and vomiting.  Genitourinary: Negative for dysuria, flank pain, frequency, hematuria and urgency.  Musculoskeletal: Negative for back pain, joint pain and myalgias.  Skin: Negative for rash.  Neurological: Negative for dizziness, tingling, focal weakness, seizures, weakness and headaches.  Endo/Heme/Allergies: Does not bruise/bleed easily.  Psychiatric/Behavioral: Negative for depression and suicidal ideas. The patient does not have insomnia.     Allergies  Allergen Reactions   Statins     Other reaction(s): Muscle Pain Possible hives also   Entex T [Pseudoephedrine-Guaifenesin] Rash   Phenylephrine-Guaifenesin Hives and Rash    Patient Active Problem List   Diagnosis Date Noted   Primary localized osteoarthrosis of left hip 06/23/2017     Past Medical History:  Diagnosis Date   Adverse effect of anesthesia    hard to awaken   Anemia    Arthritis    Asthma    Asthma without status asthmaticus    unspecified; Take advair each night   Bronchitis    Constipation    Diabetes ([place]) 2010   Diabetes mellitus type 2, uncomplicated ([place])    Diabetes mellitus without complication ([place])    Encounter for blood transfusion    hysterectomy 9-10 years ago   Heart murmur    unspecified   Hyperlipidemia    Hypertension 1991   Hypothyroid    Multiple thyroid nodules    Osteoarthritis of right hip 02/04/2017   Palpitations    Pernicious anemia    Pneumonia    Sleep apnea    Use C-PAP; can't use mouth  piece. Anxiety   Thyroid disease    nodules   Thyroid nodule    x2   Urinary urgency      Past Surgical History:  Procedure Laterality Date   ABDOMINAL HYSTERECTOMY  2011   partial   ABDOMINAL SURGERY     CARPAL TUNNEL RELEASE Left 2008   CARPAL TUNNEL RELEASE Right    CHOLECYSTECTOMY  1982   COLONOSCOPY WITH PROPOFOL N/A 10/05/2017    Procedure: COLONOSCOPY WITH PROPOFOL;  Surgeon: Manya Silvas, MD;  Location: [facility] ENDOSCOPY;  Service: Endoscopy;  Laterality: N/A;   DIAGNOSTIC LAPAROSCOPY     JOINT REPLACEMENT Right 02/09/2017   TOTAL HIP REPLACEMENT, DR. CARTER. [place]   KNEE ARTHROSCOPY  1990s   TOTAL HIP ARTHROPLASTY Left 06/23/2017   Procedure: TOTAL HIP ARTHROPLASTY ANTERIOR APPROACH;  Surgeon: Hessie Knows, MD;  Location: [facility] ORS;  Service: Orthopedics;  Laterality: Left;    Social History   Socioeconomic History   Marital status: Married    Spouse name: Not on file   Number of children: 2   Years of education: some college   Highest education level: Not on file  Occupational History   Occupation: retired    Comment: [place] resource strain: Not on file   Food insecurity    Worry: Not on file    Inability: Not on Lexicographer needs    Medical: Not on file    Non-medical: Not on file  Tobacco Use   Smoking status: Never Smoker   Smokeless tobacco: Never Used  Substance and Sexual Activity   Alcohol use: No   Drug use: No   Sexual activity: Not on file  Lifestyle   Physical activity    Days per week: Not on file    Minutes per session: Not on file   Stress: Not on file  Relationships   Social connections    Talks on phone: Not on file    Gets together: Not on file    Attends religious service: Not on file    Active member of club or organization: Not on file    Attends meetings of clubs or organizations: Not on file    Relationship status: Not on file   Intimate partner violence    Fear of current or ex partner: Not on file    Emotionally abused: Not on file    Physically abused: Not on file    Forced sexual activity: Not on file  Other Topics Concern   Not on file  Social History Narrative   Not on file     Family History  Problem Relation Age of Onset   Heart disease Mother    Hypertension  Mother    Arrhythmia Mother    Heart attack Mother    Diabetes Mother    Hypertension Father    Parkinson's disease Father    Coronary artery disease Maternal Uncle    Heart attack Maternal Uncle    Diabetes Sister    Diabetes Brother    Heart disease Brother    Breast cancer Neg Hx      Current Outpatient Medications:    albuterol (PROVENTIL HFA;VENTOLIN HFA) 108 (90 Base) MCG/ACT inhaler, Inhale 2 puffs into the lungs every 6 (six) hours as needed for wheezing or shortness of breath., Disp: , Rfl:    alum & mag hydroxide-simeth (MAALOX MAX) 400-400-40 MG/5ML suspension, Take 5 mLs by mouth every 6 (six) hours  as needed for indigestion., Disp: 355 mL, Rfl: 0   Ascorbic Acid (VITAMIN C) 1000 MG tablet, Take 1,000 mg by mouth daily., Disp: , Rfl:    Calcium Carb-Cholecalciferol (CALCIUM 500+D3) 500-400 MG-UNIT TABS, Take 1 tablet by mouth daily., Disp: , Rfl:    carvedilol (COREG) 12.5 MG tablet, Take 12.5 mg by mouth 2 (two) times daily. , Disp: , Rfl:    cholecalciferol (VITAMIN D) 1000 units tablet, Take 1,000 Units by mouth daily., Disp: , Rfl:    Cyanocobalamin (B-12 COMPLIANCE INJECTION IJ), Inject 1 Dose as directed every 30 (thirty) days. On 5th day of Month, Disp: , Rfl:    enoxaparin (LOVENOX) 40 MG/0.4ML injection, Inject 0.4 mLs (40 mg total) into the skin daily. (Patient not taking: Reported on 10/05/2017), Disp: 14 Syringe, Rfl: 0   famotidine (PEPCID) 20 MG tablet, Take 1 tablet (20 mg total) by mouth 2 (two) times daily., Disp: 60 tablet, Rfl: 1   Fluticasone-Salmeterol (ADVAIR) 250-50 MCG/DOSE AEPB, Inhale 1 puff into the lungs daily. , Disp: , Rfl:    loratadine (CLARITIN) 10 MG tablet, Take 10 mg by mouth daily as needed for allergies., Disp: , Rfl:    magnesium oxide (MAG-OX) 400 MG tablet, Take 400 mg by mouth daily., Disp: , Rfl:    meloxicam (MOBIC) 7.5 MG tablet, Take 7.5 mg by mouth daily. , Disp: , Rfl:    metFORMIN (GLUCOPHAGE) 500 MG  tablet, Take 500 mg by mouth daily. , Disp: , Rfl:    NIFEdipine (PROCARDIA-XL/ADALAT CC) 30 MG 24 hr tablet, Take 30 mg by mouth daily., Disp: , Rfl:    oxyCODONE (OXY IR/ROXICODONE) 5 MG immediate release tablet, Take 1-2 tablets (5-10 mg total) by mouth every 4 (four) hours as needed for breakthrough pain. (Patient not taking: Reported on 10/05/2017), Disp: 30 tablet, Rfl: 0   Polyethyl Glycol-Propyl Glycol (SYSTANE) 0.4-0.3 % SOLN, Apply 1 drop to eye daily as needed., Disp: , Rfl:    pyridOXINE (VITAMIN B-6) 50 MG tablet, Take 50 mg by mouth daily., Disp: , Rfl:    solifenacin (VESICARE) 10 MG tablet, Take 10 mg by mouth daily. , Disp: , Rfl:    telmisartan-hydrochlorothiazide (MICARDIS HCT) 80-25 MG tablet, Take 1 tablet by mouth daily. , Disp: , Rfl:    traMADol (ULTRAM) 50 MG tablet, Take 1 tablet (50 mg total) by mouth every 6 (six) hours as needed. (Patient not taking: Reported on 10/05/2017), Disp: 120 tablet, Rfl: 0   Physical exam:  Vitals:   10/28/19 1337  BP: 134/61  Pulse: 74  Resp: 16  Temp: (!) 97 F (36.1 C)  TempSrc: Tympanic  SpO2: 94%  Weight: 209 lb 12.8 oz (95.2 kg)   Physical Exam HENT:     Head: Normocephalic and atraumatic.  Eyes:     Pupils: Pupils are equal, round, and reactive to light.  Neck:     Musculoskeletal: Normal range of motion.  Cardiovascular:     Rate and Rhythm: Normal rate and regular rhythm.     Heart sounds: Normal heart sounds.  Pulmonary:     Effort: Pulmonary effort is normal.     Breath sounds: Normal breath sounds.  Abdominal:     General: Bowel sounds are normal.     Palpations: Abdomen is soft.  Skin:    General: Skin is warm and dry.  Neurological:     Mental Status: She is alert and oriented to person, place, and time.   Breast exam performed in  sitting and lying down position.  There is some induration at the site of the biopsy but no definite palpable mass.  No palpable mass in the left breast.  No palpable  bilateral axillary adenopathy.    CMP Latest Ref Rng & Units 09/25/2019  Glucose 70 - 99 mg/dL 95  BUN 8 - 23 mg/dL 21  Creatinine 0.44 - 1.00 mg/dL 0.89  Sodium 135 - 145 mmol/L 137  Potassium 3.5 - 5.1 mmol/L 3.4(L)  Chloride 98 - 111 mmol/L 102  CO2 22 - 32 mmol/L 30  Calcium 8.9 - 10.3 mg/dL 9.4   CBC Latest Ref Rng & Units 09/25/2019  WBC 4.0 - 10.5 K/uL 4.8  Hemoglobin 12.0 - 15.0 g/dL 12.4  Hematocrit 36.0 - 46.0 % 41.0  Platelets 150 - 400 K/uL 164    No images are attached to the encounter.  US Breast Ltd Uni Right Inc Axilla  Result Date: 10/14/2019 CLINICAL DATA:  Patient was called back from screening mammogram for right breast masses. EXAM: DIGITAL DIAGNOSTIC RIGHT MAMMOGRAM WITH CAD AND TOMO ULTRASOUND RIGHT BREAST COMPARISON:  Previous exam(s). ACR Breast Density Category b: There are scattered areas of fibroglandular density. FINDINGS: Additional imaging of the right breast was performed. There are 2 adjacent irregular masses in the upper-outer quadrant of the right breast measuring 1.3 and 0.8 cm. There are no malignant type microcalcifications. Mammographic images were processed with CAD. Targeted ultrasound is performed, showing 2 irregular hypoechoic masses in the right breast at 9 o'clock 9 cm from the nipple measuring 1.1 x 1.2 x 1.2 cm and 0.8 x 0.8 x 0.9 cm. The 2 masses are located 1.7 cm apart. Sonographic evaluation of the right axilla does not show any enlarged adenopathy. IMPRESSION: 2 suspicious masses in the upper-outer quadrant of the right breast. RECOMMENDATION: Ultrasound-guided core biopsies of the 2 masses in the upper-outer quadrant of the right breast is recommended. I have discussed the findings and recommendations with the patient. If applicable, a reminder letter will be sent to the patient regarding the next appointment. BI-RADS CATEGORY  5: Highly suggestive of malignancy. Electronically Signed   By: Lillia Mountain M.D.   On: 10/14/2019 14:31   Mm  Diag Breast Tomo Uni Right  Result Date: 10/14/2019 CLINICAL DATA:  Patient was called back from screening mammogram for right breast masses. EXAM: DIGITAL DIAGNOSTIC RIGHT MAMMOGRAM WITH CAD AND TOMO ULTRASOUND RIGHT BREAST COMPARISON:  Previous exam(s). ACR Breast Density Category b: There are scattered areas of fibroglandular density. FINDINGS: Additional imaging of the right breast was performed. There are 2 adjacent irregular masses in the upper-outer quadrant of the right breast measuring 1.3 and 0.8 cm. There are no malignant type microcalcifications. Mammographic images were processed with CAD. Targeted ultrasound is performed, showing 2 irregular hypoechoic masses in the right breast at 9 o'clock 9 cm from the nipple measuring 1.1 x 1.2 x 1.2 cm and 0.8 x 0.8 x 0.9 cm. The 2 masses are located 1.7 cm apart. Sonographic evaluation of the right axilla does not show any enlarged adenopathy. IMPRESSION: 2 suspicious masses in the upper-outer quadrant of the right breast. RECOMMENDATION: Ultrasound-guided core biopsies of the 2 masses in the upper-outer quadrant of the right breast is recommended. I have discussed the findings and recommendations with the patient. If applicable, a reminder letter will be sent to the patient regarding the next appointment. BI-RADS CATEGORY  5: Highly suggestive of malignancy. Electronically Signed   By: Lillia Mountain M.D.   On:  10/14/2019 14:31   Mm Clip Placement Right  Result Date: 10/19/2019 CLINICAL DATA:  Status post ultrasound-guided core biopsies of the right breast. EXAM: DIAGNOSTIC RIGHT MAMMOGRAM POST ULTRASOUND BIOPSIES COMPARISON:  Previous exam(s). FINDINGS: Mammographic images were obtained following ultrasound guided biopsies of the right breast. Mammographic images show there is a coil shaped clip in the mass at 9 o'clock 7 cm from the nipple. There is a venous shaped clip 4 mm medial to the biopsied smaller mass at 9 o'clock 9 cm from the nipple.  IMPRESSION: Status post ultrasound-guided core biopsy of 2 masses in the right breast. The coil shaped clip is in the mass at 9 o'clock 7 cm from the nipple. The venous shaped clip is adjacent to the mass at 9 o'clock 9 cm from the nipple. Final Assessment: Post Procedure Mammograms for Marker Placement Electronically Signed   By: Lillia Mountain M.D.   On: 10/19/2019 14:12   [place] Rt Breast Bx W Loc Dev 1st Lesion Img Bx Spec US Guide  Addendum Date: 10/26/2019   ADDENDUM REPORT: 10/25/2019 11:36 ADDENDUM: PATHOLOGY revealed: A. BREAST, RIGHT 9:00 7 CM FN; ULTRASOUND-GUIDED BIOPSY: - INVASIVE MAMMARY CARCINOMA, NO SPECIAL TYPE. : 9 mm in this sample. Grade 3. Ductal carcinoma in situ: Not identified. Lymphovascular invasion: Not identified. B. BREAST, RIGHT 9:00 9 CM FN; ULTRASOUND-GUIDED BIOPSY: - INVASIVE MAMMARY CARCINOMA, NO SPECIAL TYPE.: 8 mm in this sample. Grade 3. Ductal carcinoma in situ: Not identified. Lymphovascular invasion: Not identified Comment: The morphology of the two tumors is similar. ER/PR/HER2: Immunohistochemistry will be performed on block A1, with reflex to [place] for HER2 2+. The results will be reported in an addendum. Pathology results are CONCORDANT with imaging findings, per Dr. Lillia Mountain. I telephoned the patient on 10/24/2019 and discussed biopsy results and the recommendations stated below. All questions were answered. The patient denies significant pain or bleeding from the biopsy site. Biopsy site care instructions were reviewed and the patient was instructed to call [facility] with any concerns or questions related to the biopsy. Recommendation: Surgical referral. Request for surgical referral was relayed to nurse navigators at [facility] by Electa Sniff RN on 10/24/2019. Addendum by Electa Sniff RN on 10/25/2019. Electronically Signed   By: Lillia Mountain M.D.   On: 10/25/2019 11:36   Result Date: 10/26/2019 CLINICAL DATA:  Suspicious right breast  masses. EXAM: ULTRASOUND GUIDED RIGHT BREAST CORE NEEDLE BIOPSIES COMPARISON:  Previous exam(s). FINDINGS: I met with the patient and we discussed the procedure of ultrasound-guided biopsy, including benefits and alternatives. We discussed the high likelihood of a successful procedure. We discussed the risks of the procedure, including infection, bleeding, tissue injury, clip migration, and inadequate sampling. Informed written consent was given. The usual time-out protocol was performed immediately prior to the procedure. Lesion quadrant: 9 o'clock (7 cm from the nipple) Using sterile technique and 1% lidocaine and 1% lidocaine with epinephrine as local anesthetic, under direct ultrasound visualization, a 12 gauge spring-loaded device was used to perform biopsy of a mass in the 9 o'clock region of the right breast 7 cm from the nipple using a lateral to medial approach. At the conclusion of the procedure coil shaped tissue marker clip was deployed into the biopsy cavity. Follow up 2 view mammogram was performed and dictated separately. Lesion quadrant: 9 o'clock (9 cm from the nipple) Using sterile technique and 1% Lidocaine as local anesthetic, under direct ultrasound visualization, a 12 gauge spring-loaded device was used to perform  biopsy of a mass in the 9 o'clock region of the right breast (9 cm from the nipple) using a lateral to medial approach. At the conclusion of the procedure venous shaped tissue marker clip was deployed into the biopsy cavity. Follow up 2 view mammogram was performed and dictated separately. IMPRESSION: Ultrasound guided biopsies of the right breast. No apparent complications. Electronically Signed: By: Lillia Mountain M.D. On: 10/19/2019 14:12   [place] Rt Breast Bx W Loc Dev Ea Add Lesion Img Bx Spec US Guide  Addendum Date: 10/26/2019   ADDENDUM REPORT: 10/25/2019 11:36 ADDENDUM: PATHOLOGY revealed: A. BREAST, RIGHT 9:00 7 CM FN; ULTRASOUND-GUIDED BIOPSY: - INVASIVE MAMMARY CARCINOMA, NO  SPECIAL TYPE. : 9 mm in this sample. Grade 3. Ductal carcinoma in situ: Not identified. Lymphovascular invasion: Not identified. B. BREAST, RIGHT 9:00 9 CM FN; ULTRASOUND-GUIDED BIOPSY: - INVASIVE MAMMARY CARCINOMA, NO SPECIAL TYPE.: 8 mm in this sample. Grade 3. Ductal carcinoma in situ: Not identified. Lymphovascular invasion: Not identified Comment: The morphology of the two tumors is similar. ER/PR/HER2: Immunohistochemistry will be performed on block A1, with reflex to [place] for HER2 2+. The results will be reported in an addendum. Pathology results are CONCORDANT with imaging findings, per Dr. Lillia Mountain. I telephoned the patient on 10/24/2019 and discussed biopsy results and the recommendations stated below. All questions were answered. The patient denies significant pain or bleeding from the biopsy site. Biopsy site care instructions were reviewed and the patient was instructed to call [facility] with any concerns or questions related to the biopsy. Recommendation: Surgical referral. Request for surgical referral was relayed to nurse navigators at [facility] by Electa Sniff RN on 10/24/2019. Addendum by Electa Sniff RN on 10/25/2019. Electronically Signed   By: Lillia Mountain M.D.   On: 10/25/2019 11:36   Result Date: 10/26/2019 CLINICAL DATA:  Suspicious right breast masses. EXAM: ULTRASOUND GUIDED RIGHT BREAST CORE NEEDLE BIOPSIES COMPARISON:  Previous exam(s). FINDINGS: I met with the patient and we discussed the procedure of ultrasound-guided biopsy, including benefits and alternatives. We discussed the high likelihood of a successful procedure. We discussed the risks of the procedure, including infection, bleeding, tissue injury, clip migration, and inadequate sampling. Informed written consent was given. The usual time-out protocol was performed immediately prior to the procedure. Lesion quadrant: 9 o'clock (7 cm from the nipple) Using sterile technique and 1% lidocaine and 1%  lidocaine with epinephrine as local anesthetic, under direct ultrasound visualization, a 12 gauge spring-loaded device was used to perform biopsy of a mass in the 9 o'clock region of the right breast 7 cm from the nipple using a lateral to medial approach. At the conclusion of the procedure coil shaped tissue marker clip was deployed into the biopsy cavity. Follow up 2 view mammogram was performed and dictated separately. Lesion quadrant: 9 o'clock (9 cm from the nipple) Using sterile technique and 1% Lidocaine as local anesthetic, under direct ultrasound visualization, a 12 gauge spring-loaded device was used to perform biopsy of a mass in the 9 o'clock region of the right breast (9 cm from the nipple) using a lateral to medial approach. At the conclusion of the procedure venous shaped tissue marker clip was deployed into the biopsy cavity. Follow up 2 view mammogram was performed and dictated separately. IMPRESSION: Ultrasound guided biopsies of the right breast. No apparent complications. Electronically Signed: By: Lillia Mountain M.D. On: 10/19/2019 14:12    Assessment and plan- Patient is a 63 y.o. female with  newly diagnosed invasive mammary carcinoma of the right breast clinical prognostic stage Ia mcT1c cN0 cM0 ER/PR positive HER-2 positive to discuss further management options  Mammogram and ultrasound showed 2 distinct breast masses which were 1.7 cm apart.  The larger one was 1.1 cm and the smaller 1 was 0.8 cm.  Both of them were grade 3 ER/PR positive and HER-2 positive.  Axilla appeared normal.  Explained to the patient that given that she has 2 separate masses both of which are HER-2 positive, I would recommend neoadjuvant chemotherapy with Taxotere carboplatin Herceptin and Perjeta given IV every 3 weeks for 6 cycles.  Neoadjuvant chemotherapy and HER-2 positive breast cancer in her case would be beneficial as it would predict if she achieves a pathological complete response or not and if she does  not achieve a pathological complete response, adjuvant Kadcyla would be indicated instead of adjuvant Herceptin and Perjeta.    Also patient has 2 distinct breast masses and I would like to get MRI of her bilateral breasts to see if there is any more disease than meets the eye on mammogram.  Discussed risks and benefits of chemotherapy including all but not limited to nausea, vomiting, low blood counts, risk of infections and hospitalization.  Risk of peripheral neuropathy, hair loss and infusion reaction associated with Taxotere.  Risk of cardiotoxicity, skin rash and diarrhea associated with Herceptin and Perjeta.  Patient has no known baseline coronary artery disease.  She does have hypertension and diabetes which are well controlled at this time.  I will obtain a baseline MUGA scan prior to giving Herceptin and Perjeta.  Treatment will be given with a curative intent.  Patient understands and agrees to proceed as planned.  Patient will need port placement by Dr. Peyton Najjar and chemotherapy teach and I will tentatively see her in 2 weeks time to start first cycle of TCHP chemotherapy went on for Neulasta support  Upon completion of chemotherapy patient will proceed to surgery followed by adjuvant radiation treatment.  Patient will also need to continue Herceptin and Perjeta adjuvantly for 1 year.  There would be a role for hormone therapy given that she has ER positive disease upon completion of radiation treatment.  Patient does not meet criteria for genetic testing but we will touch base with genetic counseling to see if she will qualify.  Patient's family also interested in exploring out-of-pocket option if insurance does not cover her genetic testing.  Patient and her family had multiple insightful questions and they were all answered to patient's satisfaction  Cancer Staging Invasive carcinoma of breast [facility]) Staging form: Breast, AJCC 8th Edition - Clinical stage from 10/28/2019: Stage IA  (cT1c, cN0, cM0, G3, ER+, PR+, HER2+) - Signed by Sindy Guadeloupe, MD on 10/31/2019     Thank you for this kind referral and the opportunity to participate in the care of this patient   Visit Diagnosis 1. Invasive carcinoma of breast ([place])   2. Goals of care, counseling/discussion     Dr. Randa Evens, MD, MPH [facility] at [facility] [phone] 10/31/2019  1:04 PM

## 2019-10-31 NOTE — Telephone Encounter (Signed)
Have sent request to dr Peyton Najjar for port placement before 12/17. I spoke to vicky and she wanted me to send fax for insertion and it was sent to [phone]

## 2019-11-01 ENCOUNTER — Ambulatory Visit
Admission: RE | Admit: 2019-11-01 | Discharge: 2019-11-01 | Disposition: A | Discharge status: Home / Self Care | Source: Ambulatory Visit | Attending: Oncology | Admitting: Oncology

## 2019-11-01 ENCOUNTER — Other Ambulatory Visit: Payer: Self-pay

## 2019-11-01 DIAGNOSIS — C50919 Malignant neoplasm of unspecified site of unspecified female breast: Secondary | ICD-10-CM | POA: Diagnosis not present

## 2019-11-01 MED ORDER — TECHNETIUM TC 99M-LABELED RED BLOOD CELLS IV KIT
20.0000 | PACK | Freq: Once | INTRAVENOUS | Status: AC | PRN
  Administered 2019-11-01: 19.031 via INTRAVENOUS

## 2019-11-01 NOTE — Patient Instructions (Signed)
Trastuzumab injection for infusion What is this medicine? TRASTUZUMAB (tras TOO zoo mab) is a monoclonal antibody. It is used to treat breast cancer and stomach cancer. This medicine may be used for other purposes; ask your health care provider or pharmacist if you have questions. COMMON BRAND NAME(S): Herceptin, Herzuma, KANJINTI, Ogivri, Ontruzant, Trazimera What should I tell my health care provider before I take this medicine? They need to know if you have any of these conditions:  heart disease  heart failure  lung or breathing disease, like asthma  an unusual or allergic reaction to trastuzumab, benzyl alcohol, or other medications, foods, dyes, or preservatives  pregnant or trying to get pregnant  breast-feeding How should I use this medicine? This drug is given as an infusion into a vein. It is administered in a hospital or clinic by a specially trained health care professional. Talk to your pediatrician regarding the use of this medicine in children. This medicine is not approved for use in children. Overdosage: If you think you have taken too much of this medicine contact a poison control center or emergency room at once. NOTE: This medicine is only for you. Do not share this medicine with others. What if I miss a dose? It is important not to miss a dose. Call your doctor or health care professional if you are unable to keep an appointment. What may interact with this medicine? This medicine may interact with the following medications:  certain types of chemotherapy, such as daunorubicin, doxorubicin, epirubicin, and idarubicin This list may not describe all possible interactions. Give your health care provider a list of all the medicines, herbs, non-prescription drugs, or dietary supplements you use. Also tell them if you smoke, drink alcohol, or use illegal drugs. Some items may interact with your medicine. What should I watch for while using this medicine? Visit your  doctor for checks on your progress. Report any side effects. Continue your course of treatment even though you feel ill unless your doctor tells you to stop. Call your doctor or health care professional for advice if you get a fever, chills or sore throat, or other symptoms of a cold or flu. Do not treat yourself. Try to avoid being around people who are sick. You may experience fever, chills and shaking during your first infusion. These effects are usually mild and can be treated with other medicines. Report any side effects during the infusion to your health care professional. Fever and chills usually do not happen with later infusions. Do not become pregnant while taking this medicine or for 7 months after stopping it. Women should inform their doctor if they wish to become pregnant or think they might be pregnant. Women of child-bearing potential will need to have a negative pregnancy test before starting this medicine. There is a potential for serious side effects to an unborn child. Talk to your health care professional or pharmacist for more information. Do not breast-feed an infant while taking this medicine or for 7 months after stopping it. Women must use effective birth control with this medicine. What side effects may I notice from receiving this medicine? Side effects that you should report to your doctor or health care professional as soon as possible:  allergic reactions like skin rash, itching or hives, swelling of the face, lips, or tongue  chest pain or palpitations  cough  dizziness  feeling faint or lightheaded, falls  fever  general ill feeling or flu-like symptoms  signs of worsening heart failure like   breathing problems; swelling in your legs and feet  unusually weak or tired Side effects that usually do not require medical attention (report to your doctor or health care professional if they continue or are bothersome):  bone pain  changes in  taste  diarrhea  joint pain  nausea/vomiting  weight loss This list may not describe all possible side effects. Call your doctor for medical advice about side effects. You may report side effects to FDA at 1-800-FDA-1088. Where should I keep my medicine? This drug is given in a hospital or clinic and will not be stored at home. NOTE: This sheet is a summary. It may not cover all possible information. If you have questions about this medicine, talk to your doctor, pharmacist, or health care provider.  2020 Elsevier/Gold Standard (2016-11-04 14:37:52) Pertuzumab injection What is this medicine? PERTUZUMAB (per TOOZ ue mab) is a monoclonal antibody. It is used to treat breast cancer. This medicine may be used for other purposes; ask your health care provider or pharmacist if you have questions. COMMON BRAND NAME(S): PERJETA What should I tell my health care provider before I take this medicine? They need to know if you have any of these conditions:  heart disease  heart failure  high blood pressure  history of irregular heart beat  recent or ongoing radiation therapy  an unusual or allergic reaction to pertuzumab, other medicines, foods, dyes, or preservatives  pregnant or trying to get pregnant  breast-feeding How should I use this medicine? This medicine is for infusion into a vein. It is given by a health care professional in a hospital or clinic setting. Talk to your pediatrician regarding the use of this medicine in children. Special care may be needed. Overdosage: If you think you have taken too much of this medicine contact a poison control center or emergency room at once. NOTE: This medicine is only for you. Do not share this medicine with others. What if I miss a dose? It is important not to miss your dose. Call your doctor or health care professional if you are unable to keep an appointment. What may interact with this medicine? Interactions are not  expected. Give your health care provider a list of all the medicines, herbs, non-prescription drugs, or dietary supplements you use. Also tell them if you smoke, drink alcohol, or use illegal drugs. Some items may interact with your medicine. This list may not describe all possible interactions. Give your health care provider a list of all the medicines, herbs, non-prescription drugs, or dietary supplements you use. Also tell them if you smoke, drink alcohol, or use illegal drugs. Some items may interact with your medicine. What should I watch for while using this medicine? Your condition will be monitored carefully while you are receiving this medicine. Report any side effects. Continue your course of treatment even though you feel ill unless your doctor tells you to stop. Do not become pregnant while taking this medicine or for 7 months after stopping it. Women should inform their doctor if they wish to become pregnant or think they might be pregnant. Women of child-bearing potential will need to have a negative pregnancy test before starting this medicine. There is a potential for serious side effects to an unborn child. Talk to your health care professional or pharmacist for more information. Do not breast-feed an infant while taking this medicine or for 7 months after stopping it. Women must use effective birth control with this medicine. Call your doctor or health   care professional for advice if you get a fever, chills or sore throat, or other symptoms of a cold or flu. Do not treat yourself. Try to avoid being around people who are sick. You may experience fever, chills, and headache during the infusion. Report any side effects during the infusion to your health care professional. What side effects may I notice from receiving this medicine? Side effects that you should report to your doctor or health care professional as soon as possible:  breathing problems  chest pain or  palpitations  dizziness  feeling faint or lightheaded  fever or chills  skin rash, itching or hives  sore throat  swelling of the face, lips, or tongue  swelling of the legs or ankles  unusually weak or tired Side effects that usually do not require medical attention (report to your doctor or health care professional if they continue or are bothersome):  diarrhea  hair loss  nausea, vomiting  tiredness This list may not describe all possible side effects. Call your doctor for medical advice about side effects. You may report side effects to FDA at 1-800-FDA-1088. Where should I keep my medicine? This drug is given in a hospital or clinic and will not be stored at home. NOTE: This sheet is a summary. It may not cover all possible information. If you have questions about this medicine, talk to your doctor, pharmacist, or health care provider.  2020 Elsevier/Gold Standard (2015-12-13 12:08:50) Docetaxel injection What is this medicine? DOCETAXEL (doe se TAX el) is a chemotherapy drug. It targets fast dividing cells, like cancer cells, and causes these cells to die. This medicine is used to treat many types of cancers like breast cancer, certain stomach cancers, head and neck cancer, lung cancer, and prostate cancer. This medicine may be used for other purposes; ask your health care provider or pharmacist if you have questions. COMMON BRAND NAME(S): Docefrez, Taxotere What should I tell my health care provider before I take this medicine? They need to know if you have any of these conditions:  infection (especially a virus infection such as chickenpox, cold sores, or herpes)  liver disease  low blood counts, like low white cell, platelet, or red cell counts  an unusual or allergic reaction to docetaxel, polysorbate 80, other chemotherapy agents, other medicines, foods, dyes, or preservatives  pregnant or trying to get pregnant  breast-feeding How should I use this  medicine? This drug is given as an infusion into a vein. It is administered in a hospital or clinic by a specially trained health care professional. Talk to your pediatrician regarding the use of this medicine in children. Special care may be needed. Overdosage: If you think you have taken too much of this medicine contact a poison control center or emergency room at once. NOTE: This medicine is only for you. Do not share this medicine with others. What if I miss a dose? It is important not to miss your dose. Call your doctor or health care professional if you are unable to keep an appointment. What may interact with this medicine?  aprepitant  certain antibiotics like erythromycin or clarithromycin  certain antivirals for HIV or hepatitis  certain medicines for fungal infections like fluconazole, itraconazole, ketoconazole, posaconazole, or voriconazole  cimetidine  ciprofloxacin  conivaptan  cyclosporine  dronedarone  fluvoxamine  grapefruit juice  imatinib  verapamil This list may not describe all possible interactions. Give your health care provider a list of all the medicines, herbs, non-prescription drugs, or dietary  supplements you use. Also tell them if you smoke, drink alcohol, or use illegal drugs. Some items may interact with your medicine. What should I watch for while using this medicine? Your condition will be monitored carefully while you are receiving this medicine. You will need important blood work done while you are taking this medicine. Call your doctor or health care professional for advice if you get a fever, chills or sore throat, or other symptoms of a cold or flu. Do not treat yourself. This drug decreases your body's ability to fight infections. Try to avoid being around people who are sick. Some products may contain alcohol. Ask your health care professional if this medicine contains alcohol. Be sure to tell all health care professionals you are taking  this medicine. Certain medicines, like metronidazole and disulfiram, can cause an unpleasant reaction when taken with alcohol. The reaction includes flushing, headache, nausea, vomiting, sweating, and increased thirst. The reaction can last from 30 minutes to several hours. You may get drowsy or dizzy. Do not drive, use machinery, or do anything that needs mental alertness until you know how this medicine affects you. Do not stand or sit up quickly, especially if you are an older patient. This reduces the risk of dizzy or fainting spells. Alcohol may interfere with the effect of this medicine. Talk to your health care professional about your risk of cancer. You may be more at risk for certain types of cancer if you take this medicine. Do not become pregnant while taking this medicine or for 6 months after stopping it. Women should inform their doctor if they wish to become pregnant or think they might be pregnant. There is a potential for serious side effects to an unborn child. Talk to your health care professional or pharmacist for more information. Do not breast-feed an infant while taking this medicine or for 1 to 2 weeks after stopping it. Males who get this medicine must use a condom during sex with females who can get pregnant. If you get a woman pregnant, the baby could have birth defects. The baby could die before they are born. You will need to continue wearing a condom for 3 months after stopping the medicine. Tell your health care provider right away if your partner becomes pregnant while you are taking this medicine. This may interfere with the ability to father a child. You should talk to your doctor or health care professional if you are concerned about your fertility. What side effects may I notice from receiving this medicine? Side effects that you should report to your doctor or health care professional as soon as possible:  allergic reactions like skin rash, itching or hives, swelling of  the face, lips, or tongue  blurred vision  breathing problems  changes in vision  low blood counts - This drug may decrease the number of white blood cells, red blood cells and platelets. You may be at increased risk for infections and bleeding.  nausea and vomiting  pain, redness or irritation at site where injected  pain, tingling, numbness in the hands or feet  redness, blistering, peeling, or loosening of the skin, including inside the mouth  signs of decreased platelets or bleeding - bruising, pinpoint red spots on the skin, black, tarry stools, nosebleeds  signs of decreased red blood cells - unusually weak or tired, fainting spells, lightheadedness  signs of infection - fever or chills, cough, sore throat, pain or difficulty passing urine  swelling of the ankle, feet, hands  Side effects that usually do not require medical attention (report to your doctor or health care professional if they continue or are bothersome):  constipation  diarrhea  fingernail or toenail changes  hair loss  loss of appetite  mouth sores  muscle pain This list may not describe all possible side effects. Call your doctor for medical advice about side effects. You may report side effects to FDA at 1-800-FDA-1088. Where should I keep my medicine? This drug is given in a hospital or clinic and will not be stored at home. NOTE: This sheet is a summary. It may not cover all possible information. If you have questions about this medicine, talk to your doctor, pharmacist, or health care provider.  2020 Elsevier/Gold Standard (2019-01-14 12:23:11) Carboplatin injection What is this medicine? CARBOPLATIN (KAR boe pla tin) is a chemotherapy drug. It targets fast dividing cells, like cancer cells, and causes these cells to die. This medicine is used to treat ovarian cancer and many other cancers. This medicine may be used for other purposes; ask your health care provider or pharmacist if you have  questions. COMMON BRAND NAME(S): Paraplatin What should I tell my health care provider before I take this medicine? They need to know if you have any of these conditions:  blood disorders  hearing problems  kidney disease  recent or ongoing radiation therapy  an unusual or allergic reaction to carboplatin, cisplatin, other chemotherapy, other medicines, foods, dyes, or preservatives  pregnant or trying to get pregnant  breast-feeding How should I use this medicine? This drug is usually given as an infusion into a vein. It is administered in a hospital or clinic by a specially trained health care professional. Talk to your pediatrician regarding the use of this medicine in children. Special care may be needed. Overdosage: If you think you have taken too much of this medicine contact a poison control center or emergency room at once. NOTE: This medicine is only for you. Do not share this medicine with others. What if I miss a dose? It is important not to miss a dose. Call your doctor or health care professional if you are unable to keep an appointment. What may interact with this medicine?  medicines for seizures  medicines to increase blood counts like filgrastim, pegfilgrastim, sargramostim  some antibiotics like amikacin, gentamicin, neomycin, streptomycin, tobramycin  vaccines Talk to your doctor or health care professional before taking any of these medicines:  acetaminophen  aspirin  ibuprofen  ketoprofen  naproxen This list may not describe all possible interactions. Give your health care provider a list of all the medicines, herbs, non-prescription drugs, or dietary supplements you use. Also tell them if you smoke, drink alcohol, or use illegal drugs. Some items may interact with your medicine. What should I watch for while using this medicine? Your condition will be monitored carefully while you are receiving this medicine. You will need important blood work done  while you are taking this medicine. This drug may make you feel generally unwell. This is not uncommon, as chemotherapy can affect healthy cells as well as cancer cells. Report any side effects. Continue your course of treatment even though you feel ill unless your doctor tells you to stop. In some cases, you may be given additional medicines to help with side effects. Follow all directions for their use. Call your doctor or health care professional for advice if you get a fever, chills or sore throat, or other symptoms of a cold or flu. Do  not treat yourself. This drug decreases your body's ability to fight infections. Try to avoid being around people who are sick. This medicine may increase your risk to bruise or bleed. Call your doctor or health care professional if you notice any unusual bleeding. Be careful brushing and flossing your teeth or using a toothpick because you may get an infection or bleed more easily. If you have any dental work done, tell your dentist you are receiving this medicine. Avoid taking products that contain aspirin, acetaminophen, ibuprofen, naproxen, or ketoprofen unless instructed by your doctor. These medicines may hide a fever. Do not become pregnant while taking this medicine. Women should inform their doctor if they wish to become pregnant or think they might be pregnant. There is a potential for serious side effects to an unborn child. Talk to your health care professional or pharmacist for more information. Do not breast-feed an infant while taking this medicine. What side effects may I notice from receiving this medicine? Side effects that you should report to your doctor or health care professional as soon as possible:  allergic reactions like skin rash, itching or hives, swelling of the face, lips, or tongue  signs of infection - fever or chills, cough, sore throat, pain or difficulty passing urine  signs of decreased platelets or bleeding - bruising, pinpoint  red spots on the skin, black, tarry stools, nosebleeds  signs of decreased red blood cells - unusually weak or tired, fainting spells, lightheadedness  breathing problems  changes in hearing  changes in vision  chest pain  high blood pressure  low blood counts - This drug may decrease the number of white blood cells, red blood cells and platelets. You may be at increased risk for infections and bleeding.  nausea and vomiting  pain, swelling, redness or irritation at the injection site  pain, tingling, numbness in the hands or feet  problems with balance, talking, walking  trouble passing urine or change in the amount of urine Side effects that usually do not require medical attention (report to your doctor or health care professional if they continue or are bothersome):  hair loss  loss of appetite  metallic taste in the mouth or changes in taste This list may not describe all possible side effects. Call your doctor for medical advice about side effects. You may report side effects to FDA at 1-800-FDA-1088. Where should I keep my medicine? This drug is given in a hospital or clinic and will not be stored at home. NOTE: This sheet is a summary. It may not cover all possible information. If you have questions about this medicine, talk to your doctor, pharmacist, or health care provider.  2020 Elsevier/Gold Standard (2008-02-15 14:38:05)

## 2019-11-02 ENCOUNTER — Other Ambulatory Visit: Payer: Self-pay

## 2019-11-02 ENCOUNTER — Inpatient Hospital Stay (HOSPITAL_BASED_OUTPATIENT_CLINIC_OR_DEPARTMENT_OTHER): Admitting: Oncology

## 2019-11-02 ENCOUNTER — Ambulatory Visit: Payer: Self-pay | Admitting: General Surgery

## 2019-11-02 ENCOUNTER — Inpatient Hospital Stay

## 2019-11-02 DIAGNOSIS — C50919 Malignant neoplasm of unspecified site of unspecified female breast: Secondary | ICD-10-CM | POA: Diagnosis not present

## 2019-11-02 DIAGNOSIS — Z5112 Encounter for antineoplastic immunotherapy: Secondary | ICD-10-CM | POA: Diagnosis not present

## 2019-11-02 NOTE — H&P (Signed)
HISTORY OF PRESENT ILLNESS:    Christina Mcclain is a 63 y.o.female patient who comes for evaluation of insertion of Port-A-Cath.  Patient with right breast cancer.  She was found with two 1 cm masses at the 9 o'clock position of the right breast.  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  Both masses were ER/PR, HER-2 positive.  Patient was evaluated by oncology and neoadjuvant chemotherapy was recommended.  Patient denies any pain.  There is no radiation.  There is no alleviating or aggravating factor.  Denies chest pain.  Patient denies shortness of breath.  Patient is able to perform 4 METS without shortness of breath or chest pain.      PAST MEDICAL HISTORY:      Past Medical History:  Diagnosis Date  . Asthma without status asthmaticus, unspecified    Take advair each night  . Diabetes mellitus type 2, uncomplicated (CMS-HCC)   . Encounter for blood transfusion    Hysterotomy 9-10 yrs ago  . Heart murmur, unspecified   . High cholesterol 11/14/2012  . History of cancer    breast  . HTN, goal below 140/90 03/17/2017  . Hypertension   . Mild persistent asthma without complication 1/74/0814  . Osteoarthritis   . Pernicious anemia   . Sleep apnea    Can't use mouth piece. Anxiety  . 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 Thyroid nodule    x2        PAST SURGICAL HISTORY:        Past Surgical History:  Procedure Laterality Date  . ARTHROPLASTY HIP TOTAL Right 02/09/2017   done in [place]  . CHOLECYSTECTOMY     1980's  . COLONOSCOPY  10/02/2014   Adenomatous Polyps  . COLONOSCOPY  10/05/2017   Adenomatous Polyps: CBF 09/2022  . ENDOSCOPIC CARPAL TUNNEL RELEASE Left 2008  . HYSTERECTOMY     partial  . JOINT REPLACEMENT Right 02/09/2017   Right THA- Done in [place]  . KNEE ARTHROSCOPY     1990's  . Total hip arthroplasty anterior approach Left 06/23/2017   Dr.Menz         MEDICATIONS:  Encounter Medications        Outpatient  Encounter Medications as of 11/02/2019  Medication Sig Dispense Refill  . albuterol 90 mcg/actuation inhaler Inhale 2 inhalations into the lungs every 4 (four) hours as needed 3 Inhaler 3  . carvediloL (COREG) 12.5 MG tablet Take 1 tablet (12.5 mg total) by mouth 2 (two) times daily with meals 180 tablet 3  . cholecalciferol (CHOLECALCIFEROL) 1,000 unit tablet Take 1,000 Units by mouth once daily.    Marland Kitchen dexAMETHasone (DECADRON) 4 MG tablet Take by mouth    . fluticasone propion-salmeteroL (ADVAIR DISKUS) 250-50 mcg/dose diskus inhaler Inhale 1 inhalation into the lungs every 12 (twelve) hours 180 Inhaler 3  . FREESTYLE LANCETS lancets Inject 1 each into the skin 3 (three) times daily 300 each 3  . gabapentin (NEURONTIN) 100 MG capsule Take 2 capsules (200 mg total) by mouth 3 (three) times daily (Patient taking differently: Take 200 mg by mouth 2 (two) times daily   ) 270 capsule 3  . lidocaine-prilocaine (EMLA) cream Apply to affected area once    . LORazepam (ATIVAN) 0.5 MG tablet Take by mouth    . metFORMIN (GLUCOPHAGE-XR) 500 MG XR tablet Take 1 tablet (500 mg total) by mouth daily with dinner 90 tablet 3  . MICARDIS HCT 80-25 mg tablet TAKE 1 TABLET DAILY 90 tablet  3  . ondansetron (ZOFRAN) 8 MG tablet Take by mouth    . prochlorperazine (COMPAZINE) 10 MG tablet Take by mouth    . solifenacin (VESICARE) 5 MG tablet Take 2 tablets (10 mg total) by mouth once daily 90 tablet 4  . [DISCONTINUED] diclofenac (VOLTAREN) 1 % topical gel Apply 4 g topically 3 (three) times daily 350 g 2   No facility-administered encounter medications on file as of 11/02/2019.        ALLERGIES:   Phenylephrine-guaifenesin, Statins-hmg-coa reductase inhibitors, and Pseudoephedrine-guaifenesin   SOCIAL HISTORY:  Social History          Socioeconomic History  . Marital status: Married    Spouse name: Eloisa Northern. [place]  . Number of children: 2  . Years of education: 33  . Highest education  level: Not on file  Occupational History  . Not on file  Social Needs  . Financial resource strain: Not on file  . Food insecurity    Worry: Not on file    Inability: Not on file  . Transportation needs    Medical: Not on file    Non-medical: Not on file  Tobacco Use  . Smoking status: Never Smoker  . Smokeless tobacco: Never Used  Substance and Sexual Activity  . Alcohol use: No  . Drug use: No  . Sexual activity: Not Currently    Birth control/protection: None  Other Topics Concern  . Would you please tell us about the people who live in your home, your pets, or anything else important to your social life? Not Asked  Social History Narrative  . Not on file      FAMILY HISTORY:       Family History  Problem Relation Age of Onset  . No Known Problems Mother   . No Known Problems Father   . High blood pressure (Hypertension) Sister   . High blood pressure (Hypertension) Brother   . Hyperlipidemia (Elevated cholesterol) Brother      GENERAL REVIEW OF SYSTEMS:   General ROS: negative for - chills, fatigue, fever, weight gain or weight loss Allergy and Immunology ROS: negative for - hives  Hematological and Lymphatic ROS: negative for - bleeding problems or bruising, negative for palpable nodes Endocrine ROS: negative for - heat or cold intolerance, hair changes Respiratory ROS: negative for - cough, shortness of breath or wheezing Cardiovascular ROS: no chest pain or palpitations GI ROS: negative for nausea, vomiting, abdominal pain, diarrhea, constipation Musculoskeletal ROS: negative for - joint swelling or muscle pain Neurological ROS: negative for - confusion, syncope Dermatological ROS: negative for pruritus and rash  PHYSICAL EXAM:     Vitals:   11/02/19 0813  BP: 130/77  Pulse: 72  .  Ht:157.5 cm (5' 2" ) Wt:96.2 kg (212 lb) QDI:YMEB surface area is 2.05 meters squared. Body mass index is 38.78 kg/m.Marland Kitchen   GENERAL: Alert, active,  oriented x3  HEENT: Pupils equal reactive to light. Extraocular movements are intact. Sclera clear. Palpebral conjunctiva normal red color.Pharynx clear.  NECK: Supple with no palpable mass and no adenopathy.  LUNGS: Sound clear with no rales rhonchi or wheezes.  HEART: Regular rhythm S1 and S2 without murmur.  ABDOMEN: Soft and depressible, nontender with no palpable mass, no hepatomegaly.   EXTREMITIES: Well-developed well-nourished symmetrical with no dependent edema.  NEUROLOGICAL: Awake alert oriented, facial expression symmetrical, moving all extremities.      IMPRESSION:     Malignant neoplasm of lower-outer quadrant of right breast of female,  estrogen receptor positive (CMS-HCC) [C50.511, Z17.0]  Patient was oriented about the recommendation having neoadjuvant by oncologist.  The best way to receive the chemotherapy is to the Port-A-Cath.  This is a device that is placed in a larger vein to protect this more veins and have easy access for this type of therapy.  I discussed with the patient the benefits and the risk of surgery.  The risk include bleeding, infection, pneumothorax, hemothorax, arteriovenous fistula, pain, among others.  Patient also oriented about the risk of anesthesia that will also be discussed by anesthesiologist before surgery.         PLAN:  1. Insertion of Port a Cath [id number], [id number], [id number]) 2. CBC, CMP done 09/25/2019 3. Do not take aspirin 5 days before surgery 4. Contact us if has any question or concern.   Patient and her husband verbalized understanding, all questions were answered, and were agreeable with the plan outlined above.   Herbert Pun, MD  Electronically signed by Herbert Pun, MD

## 2019-11-02 NOTE — H&P (View-Only) (Signed)
HISTORY OF PRESENT ILLNESS:    Christina Mcclain is a 63 y.o.female patient who comes for evaluation of insertion of Port-A-Cath.  Patient with right breast cancer.  She was found with two 1 cm masses at the 9 o'clock position of the right breast.  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  Both masses were ER/PR, HER-2 positive.  Patient was evaluated by oncology and neoadjuvant chemotherapy was recommended.  Patient denies any pain.  There is no radiation.  There is no alleviating or aggravating factor.  Denies chest pain.  Patient denies shortness of breath.  Patient is able to perform 4 METS without shortness of breath or chest pain.      PAST MEDICAL HISTORY:      Past Medical History:  Diagnosis Date  . Asthma without status asthmaticus, unspecified    Take advair each night  . Diabetes mellitus type 2, uncomplicated (CMS-HCC)   . Encounter for blood transfusion    Hysterotomy 9-10 yrs ago  . Heart murmur, unspecified   . High cholesterol 11/14/2012  . History of cancer    breast  . HTN, goal below 140/90 03/17/2017  . Hypertension   . Mild persistent asthma without complication 8/65/7846  . Osteoarthritis   . Pernicious anemia   . Sleep apnea    Can't use mouth piece. Anxiety  . 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 Thyroid nodule    x2        PAST SURGICAL HISTORY:        Past Surgical History:  Procedure Laterality Date  . ARTHROPLASTY HIP TOTAL Right 02/09/2017   done in [place]  . CHOLECYSTECTOMY     1980's  . COLONOSCOPY  10/02/2014   Adenomatous Polyps  . COLONOSCOPY  10/05/2017   Adenomatous Polyps: CBF 09/2022  . ENDOSCOPIC CARPAL TUNNEL RELEASE Left 2008  . HYSTERECTOMY     partial  . JOINT REPLACEMENT Right 02/09/2017   Right THA- Done in [place]  . KNEE ARTHROSCOPY     1990's  . Total hip arthroplasty anterior approach Left 06/23/2017   Dr.Menz         MEDICATIONS:  Encounter Medications        Outpatient  Encounter Medications as of 11/02/2019  Medication Sig Dispense Refill  . albuterol 90 mcg/actuation inhaler Inhale 2 inhalations into the lungs every 4 (four) hours as needed 3 Inhaler 3  . carvediloL (COREG) 12.5 MG tablet Take 1 tablet (12.5 mg total) by mouth 2 (two) times daily with meals 180 tablet 3  . cholecalciferol (CHOLECALCIFEROL) 1,000 unit tablet Take 1,000 Units by mouth once daily.    Marland Kitchen dexAMETHasone (DECADRON) 4 MG tablet Take by mouth    . fluticasone propion-salmeteroL (ADVAIR DISKUS) 250-50 mcg/dose diskus inhaler Inhale 1 inhalation into the lungs every 12 (twelve) hours 180 Inhaler 3  . FREESTYLE LANCETS lancets Inject 1 each into the skin 3 (three) times daily 300 each 3  . gabapentin (NEURONTIN) 100 MG capsule Take 2 capsules (200 mg total) by mouth 3 (three) times daily (Patient taking differently: Take 200 mg by mouth 2 (two) times daily   ) 270 capsule 3  . lidocaine-prilocaine (EMLA) cream Apply to affected area once    . LORazepam (ATIVAN) 0.5 MG tablet Take by mouth    . metFORMIN (GLUCOPHAGE-XR) 500 MG XR tablet Take 1 tablet (500 mg total) by mouth daily with dinner 90 tablet 3  . MICARDIS HCT 80-25 mg tablet TAKE 1 TABLET DAILY 90 tablet  3  . ondansetron (ZOFRAN) 8 MG tablet Take by mouth    . prochlorperazine (COMPAZINE) 10 MG tablet Take by mouth    . solifenacin (VESICARE) 5 MG tablet Take 2 tablets (10 mg total) by mouth once daily 90 tablet 4  . [DISCONTINUED] diclofenac (VOLTAREN) 1 % topical gel Apply 4 g topically 3 (three) times daily 350 g 2   No facility-administered encounter medications on file as of 11/02/2019.        ALLERGIES:   Phenylephrine-guaifenesin, Statins-hmg-coa reductase inhibitors, and Pseudoephedrine-guaifenesin   SOCIAL HISTORY:  Social History          Socioeconomic History  . Marital status: Married    Spouse name: Eloisa Northern. [place]  . Number of children: 2  . Years of education: 53  . Highest education  level: Not on file  Occupational History  . Not on file  Social Needs  . Financial resource strain: Not on file  . Food insecurity    Worry: Not on file    Inability: Not on file  . Transportation needs    Medical: Not on file    Non-medical: Not on file  Tobacco Use  . Smoking status: Never Smoker  . Smokeless tobacco: Never Used  Substance and Sexual Activity  . Alcohol use: No  . Drug use: No  . Sexual activity: Not Currently    Birth control/protection: None  Other Topics Concern  . Would you please tell us about the people who live in your home, your pets, or anything else important to your social life? Not Asked  Social History Narrative  . Not on file      FAMILY HISTORY:       Family History  Problem Relation Age of Onset  . No Known Problems Mother   . No Known Problems Father   . High blood pressure (Hypertension) Sister   . High blood pressure (Hypertension) Brother   . Hyperlipidemia (Elevated cholesterol) Brother      GENERAL REVIEW OF SYSTEMS:   General ROS: negative for - chills, fatigue, fever, weight gain or weight loss Allergy and Immunology ROS: negative for - hives  Hematological and Lymphatic ROS: negative for - bleeding problems or bruising, negative for palpable nodes Endocrine ROS: negative for - heat or cold intolerance, hair changes Respiratory ROS: negative for - cough, shortness of breath or wheezing Cardiovascular ROS: no chest pain or palpitations GI ROS: negative for nausea, vomiting, abdominal pain, diarrhea, constipation Musculoskeletal ROS: negative for - joint swelling or muscle pain Neurological ROS: negative for - confusion, syncope Dermatological ROS: negative for pruritus and rash  PHYSICAL EXAM:     Vitals:   11/02/19 0813  BP: 130/77  Pulse: 72  .  Ht:157.5 cm (5' 2" ) Wt:96.2 kg (212 lb) FOY:DXAJ surface area is 2.05 meters squared. Body mass index is 38.78 kg/m.Marland Kitchen   GENERAL: Alert, active,  oriented x3  HEENT: Pupils equal reactive to light. Extraocular movements are intact. Sclera clear. Palpebral conjunctiva normal red color.Pharynx clear.  NECK: Supple with no palpable mass and no adenopathy.  LUNGS: Sound clear with no rales rhonchi or wheezes.  HEART: Regular rhythm S1 and S2 without murmur.  ABDOMEN: Soft and depressible, nontender with no palpable mass, no hepatomegaly.   EXTREMITIES: Well-developed well-nourished symmetrical with no dependent edema.  NEUROLOGICAL: Awake alert oriented, facial expression symmetrical, moving all extremities.      IMPRESSION:     Malignant neoplasm of lower-outer quadrant of right breast of female,  estrogen receptor positive (CMS-HCC) [C50.511, Z17.0]  Patient was oriented about the recommendation having neoadjuvant by oncologist.  The best way to receive the chemotherapy is to the Port-A-Cath.  This is a device that is placed in a larger vein to protect this more veins and have easy access for this type of therapy.  I discussed with the patient the benefits and the risk of surgery.  The risk include bleeding, infection, pneumothorax, hemothorax, arteriovenous fistula, pain, among others.  Patient also oriented about the risk of anesthesia that will also be discussed by anesthesiologist before surgery.         PLAN:  1. Insertion of Port a Cath [id number], [id number], [id number]) 2. CBC, CMP done 09/25/2019 3. Do not take aspirin 5 days before surgery 4. Contact us if has any question or concern.   Patient and her husband verbalized understanding, all questions were answered, and were agreeable with the plan outlined above.   Herbert Pun, MD  Electronically signed by Herbert Pun, MD

## 2019-11-03 ENCOUNTER — Encounter
Admission: RE | Admit: 2019-11-03 | Discharge: 2019-11-03 | Disposition: A | Discharge status: Home / Self Care | Source: Ambulatory Visit | Attending: General Surgery | Admitting: General Surgery

## 2019-11-03 ENCOUNTER — Other Ambulatory Visit: Payer: Self-pay

## 2019-11-03 DIAGNOSIS — Z01812 Encounter for preprocedural laboratory examination: Secondary | ICD-10-CM | POA: Insufficient documentation

## 2019-11-03 DIAGNOSIS — Z20828 Contact with and (suspected) exposure to other viral communicable diseases: Secondary | ICD-10-CM | POA: Insufficient documentation

## 2019-11-03 HISTORY — DX: Myoneural disorder, unspecified: G70.9

## 2019-11-03 LAB — POTASSIUM: Potassium: 3.6 mmol/L (ref 3.5–5.1)

## 2019-11-03 NOTE — Patient Instructions (Signed)
Your procedure is scheduled on: Monday 11/07/19 Report to [place]. To find out your arrival time please call [phone] between 1PM - 3PM on Friday 11/04/19.  Remember: Instructions that are not followed completely may result in serious medical risk, up to and including death, or upon the discretion of your surgeon and anesthesiologist your surgery may need to be rescheduled.     _X__ 1. Do not eat food after midnight the night before your procedure.                 No gum chewing or hard candies. You may drink clear liquids up to 2 hours                 before you are scheduled to arrive for your surgery- DO not drink clear                 liquids within 2 hours of the start of your surgery.                 Clear Liquids include:  water, apple juice without pulp, clear carbohydrate                 drink such as Clearfast or Gatorade, Black Coffee or Tea (Do not add                 anything to coffee or tea). Diabetics water only  __X__2.  On the morning of surgery brush your teeth with toothpaste and water, you                 may rinse your mouth with mouthwash if you wish.  Do not swallow any              toothpaste of mouthwash.     _X__ 3.  No Alcohol for 24 hours before or after surgery.   _X__ 4.  Do Not Smoke or use e-cigarettes For 24 Hours Prior to Your Surgery.                 Do not use any chewable tobacco products for at least 6 hours prior to                 surgery.  ____  5.  Bring all medications with you on the day of surgery if instructed.   __X__  6.  Notify your doctor if there is any change in your medical condition      (cold, fever, infections).     Do not wear jewelry, make-up, hairpins, clips or nail polish. Do not wear lotions, powders, or perfumes.  Do not shave 48 hours prior to surgery. Men may shave face and neck. Do not bring valuables to the hospital.    [facility] is not responsible for  any belongings or valuables.  Contacts, dentures/partials or body piercings may not be worn into surgery. Bring a case for your contacts, glasses or hearing aids, a denture cup will be supplied. Leave your suitcase in the car. After surgery it may be brought to your room. For patients admitted to the hospital, discharge time is determined by your treatment team.   Patients discharged the day of surgery will not be allowed to drive home.   Please read over the following fact sheets that you were given:   MRSA Information  __X__ Take these medicines the morning of surgery with A SIP OF WATER:  1. carvedilol (COREG) 12.5 MG tablet  2. gabapentin (NEURONTIN) 100 MG capsule  3. NIFEdipine (PROCARDIA-XL/ADALAT CC) 30 MG 24 hr tablet  4.  5.  6.  ____ Fleet Enema (as directed)   __X__ Use CHG Soap/SAGE wipes as directed  __X__ Use inhalers on the day of surgery  __X__ Stop metformin/Janumet/Farxiga 2 days prior to surgery    ____ Take 1/2 of usual insulin dose the night before surgery. No insulin the morning          of surgery.   ____ Stop Blood Thinners Coumadin/Plavix/Xarelto/Pleta/Pradaxa/Eliquis/Effient/Aspirin  on   Or contact your Surgeon, Cardiologist or Medical Doctor regarding  ability to stop your blood thinners  __X__ Stop Anti-inflammatories 7 days before surgery such as Advil, Ibuprofen, Motrin,  BC or Goodies Powder, Naprosyn, Naproxen, Aleve, Aspirin    __X__ Stop all herbal supplements, fish oil or vitamin E until after surgery.    ____ Bring C-Pap to the hospital.

## 2019-11-04 LAB — SARS CORONAVIRUS 2 (TAT 6-24 HRS): SARS Coronavirus 2: NEGATIVE

## 2019-11-04 NOTE — Progress Notes (Signed)
[place]  [phone] [phone]  Patient Care Team: Kirk Ruths, MD as PCP - General (Internal Medicine) Theodore Demark, RN as Registered Nurse (Oncology)   Name of the patient: Christina Mcclain  [id number]  May 20, 1956   Date of visit: 11/04/19  Diagnosis- Breast Cancer   Chief complaint/Reason for visit- Initial Meeting for [facility], preparing for starting chemotherapy  Heme/Onc history:  Oncology History  Invasive carcinoma of breast ([place])  10/28/2019 Cancer Staging   Staging form: Breast, AJCC 8th Edition - Clinical stage from 10/28/2019: Stage IA (cT1c, cN0, cM0, G3, ER+, PR+, HER2+) - Signed by Sindy Guadeloupe, MD on 10/31/2019   10/31/2019 Initial Diagnosis   Invasive carcinoma of breast ([place])   11/08/2019 -  Chemotherapy   The patient had palonosetron (ALOXI) injection 0.25 mg, 0.25 mg, Intravenous,  Once, 0 of 6 cycles pegfilgrastim-jmdb (FULPHILA) injection 6 mg, 6 mg, Subcutaneous,  Once, 0 of 6 cycles CARBOplatin (PARAPLATIN) in sodium chloride 0.9 % 100 mL chemo infusion, , Intravenous,  Once, 0 of 6 cycles DOCEtaxel (TAXOTERE) 150 mg in sodium chloride 0.9 % 250 mL chemo infusion, 75 mg/m2, Intravenous,  Once, 0 of 6 cycles fosaprepitant (EMEND) 150 mg in sodium chloride 0.9 % 145 mL IVPB, 150 mg, Intravenous,  Once, 0 of 6 cycles pertuzumab (PERJETA) 840 mg in sodium chloride 0.9 % 250 mL chemo infusion, 840 mg, Intravenous, Once, 0 of 6 cycles trastuzumab-dkst (OGIVRI) 756 mg in sodium chloride 0.9 % 250 mL chemo infusion, 8 mg/kg, Intravenous,  Once, 0 of 6 cycles  for chemotherapy treatment.      Interval history-  Christina Mcclain is a 63 year old female who presents to chemo care clinic today for initial meeting in preparation for starting chemotherapy. I introduced the chemo care clinic and we discussed that the role of the clinic is to assist those who are at an  increased risk of emergency room visits and/or complications during the course of chemotherapy treatment. We discussed that the increased risk takes into account factors such as age, performance status, and co-morbidities. We also discussed that for some, this might include barriers to care such as not having a primary care provider, lack of insurance/transportation, or not being able to afford medications. We discussed that the goal of the program is to help prevent unplanned ER visits and help reduce complications during chemotherapy. We do this by discussing specific risk factors to each individual and identifying ways that we can help improve these risk factors and reduce barriers to care.   ECOG FS:0 - Asymptomatic  Review of systems- ROS   Current treatment- Beginning treatment   Allergies  Allergen Reactions  . Statins     Other reaction(s): Muscle Pain Possible hives also    Past Medical History:  Diagnosis Date  . Adverse effect of anesthesia    hard to awaken  . Anemia   . Arthritis   . Asthma   . Asthma without status asthmaticus    unspecified; Take advair each night  . Bronchitis   . Constipation   . Diabetes ([place]) 2010  . Diabetes mellitus type 2, uncomplicated ([place])   . Diabetes mellitus without complication ([place])   . Encounter for blood transfusion    hysterectomy 9-10 years ago  . Heart murmur    unspecified  . Hyperlipidemia   . Hypertension 1991  . Hypothyroid   . Multiple thyroid nodules   .  Neuromuscular disorder (HCC)    neuropathy  . Osteoarthritis of right hip 02/04/2017  . Palpitations   . Pernicious anemia   . Pneumonia   . Sleep apnea    Use C-PAP; can't use mouth piece. Anxiety  . Thyroid disease    nodules  . Thyroid nodule    x2  . Urinary urgency     Past Surgical History:  Procedure Laterality Date  . ABDOMINAL HYSTERECTOMY  2011   partial  . ABDOMINAL SURGERY    . CARPAL TUNNEL RELEASE Left 2008  . CARPAL TUNNEL RELEASE Right    . CHOLECYSTECTOMY  1982  . COLONOSCOPY WITH PROPOFOL N/A 10/05/2017   Procedure: COLONOSCOPY WITH PROPOFOL;  Surgeon: Manya Silvas, MD;  Location: [facility] ENDOSCOPY;  Service: Endoscopy;  Laterality: N/A;  . DIAGNOSTIC LAPAROSCOPY    . JOINT REPLACEMENT Right 02/09/2017   TOTAL HIP REPLACEMENT, DR. CARTER. [place]  . KNEE ARTHROSCOPY  1990s  . TOTAL HIP ARTHROPLASTY Left 06/23/2017   Procedure: TOTAL HIP ARTHROPLASTY ANTERIOR APPROACH;  Surgeon: Hessie Knows, MD;  Location: [facility] ORS;  Service: Orthopedics;  Laterality: Left;    Social History   Socioeconomic History  . Marital status: Married    Spouse name: Not on file  . Number of children: 2  . Years of education: some college  . Highest education level: Not on file  Occupational History  . Occupation: retired    Comment: [employer]  Tobacco Use  . Smoking status: Never Smoker  . Smokeless tobacco: Never Used  Substance and Sexual Activity  . Alcohol use: No  . Drug use: No  . Sexual activity: Not on file  Other Topics Concern  . Not on file  Social History Narrative  . Not on file   Social Determinants of Health   Financial Resource Strain:   . Difficulty of Paying Living Expenses: Not on file  Food Insecurity:   . Worried About Charity fundraiser in the Last Year: Not on file  . Ran Out of Food in the Last Year: Not on file  Transportation Needs:   . Lack of Transportation (Medical): Not on file  . Lack of Transportation (Non-Medical): Not on file  Physical Activity:   . Days of Exercise per Week: Not on file  . Minutes of Exercise per Session: Not on file  Stress:   . Feeling of Stress : Not on file  Social Connections:   . Frequency of Communication with Friends and Family: Not on file  . Frequency of Social Gatherings with Friends and Family: Not on file  . Attends Religious Services: Not on file  . Active Member of Clubs or Organizations: Not on file  . Attends Archivist  Meetings: Not on file  . Marital Status: Not on file  Intimate Partner Violence:   . Fear of Current or Ex-Partner: Not on file  . Emotionally Abused: Not on file  . Physically Abused: Not on file  . Sexually Abused: Not on file    Family History  Problem Relation Age of Onset  . Heart disease Mother   . Hypertension Mother   . Arrhythmia Mother   . Heart attack Mother   . Diabetes Mother   . Hypertension Father   . Parkinson's disease Father   . Coronary artery disease Maternal Uncle   . Heart attack Maternal Uncle   . Diabetes Sister   . Diabetes Brother   . Heart disease Brother   .  Breast cancer Neg Hx      Current Outpatient Medications:  .  albuterol (PROVENTIL HFA;VENTOLIN HFA) 108 (90 Base) MCG/ACT inhaler, Inhale 2 puffs into the lungs every 6 (six) hours as needed for wheezing or shortness of breath., Disp: , Rfl:  .  carvedilol (COREG) 12.5 MG tablet, Take 12.5 mg by mouth 2 (two) times daily. , Disp: , Rfl:  .  cholecalciferol (VITAMIN D) 1000 units tablet, Take 1,000 Units by mouth daily., Disp: , Rfl:  .  dexamethasone (DECADRON) 4 MG tablet, Take 2 tablets (8 mg total) by mouth 2 (two) times daily. Start the day before Taxotere. Take once the day after, then 2 times a day x 2d., Disp: 30 tablet, Rfl: 1 .  Fluticasone-Salmeterol (ADVAIR) 250-50 MCG/DOSE AEPB, Inhale 1 puff into the lungs daily. , Disp: , Rfl:  .  gabapentin (NEURONTIN) 100 MG capsule, Take 200 mg by mouth 2 (two) times daily., Disp: , Rfl:  .  lidocaine-prilocaine (EMLA) cream, Apply to affected area once (Patient taking differently: Apply 1 application topically once. Apply to affected area once), Disp: 30 g, Rfl: 3 .  loratadine (CLARITIN) 10 MG tablet, Take 10 mg by mouth daily as needed for allergies., Disp: , Rfl:  .  LORazepam (ATIVAN) 0.5 MG tablet, Take 1 tablet (0.5 mg total) by mouth every 6 (six) hours as needed (Nausea or vomiting)., Disp: 30 tablet, Rfl: 0 .  metFORMIN (GLUCOPHAGE) 500  MG tablet, Take 500 mg by mouth daily. , Disp: , Rfl:  .  NIFEdipine (PROCARDIA-XL/ADALAT CC) 30 MG 24 hr tablet, Take 30 mg by mouth daily., Disp: , Rfl:  .  ondansetron (ZOFRAN) 8 MG tablet, Take 1 tablet (8 mg total) by mouth 2 (two) times daily as needed (Nausea or vomiting). Begin 4 days after chemotherapy., Disp: 30 tablet, Rfl: 1 .  prochlorperazine (COMPAZINE) 10 MG tablet, Take 1 tablet (10 mg total) by mouth every 6 (six) hours as needed (Nausea or vomiting)., Disp: 30 tablet, Rfl: 1 .  solifenacin (VESICARE) 10 MG tablet, Take 10 mg by mouth daily. , Disp: , Rfl:  .  telmisartan-hydrochlorothiazide (MICARDIS HCT) 80-25 MG tablet, Take 1 tablet by mouth daily. , Disp: , Rfl:   Physical exam: There were no vitals filed for this visit. Physical Exam   CMP Latest Ref Rng & Units 11/03/2019  Glucose 70 - 99 mg/dL -  BUN 8 - 23 mg/dL -  Creatinine 0.44 - 1.00 mg/dL -  Sodium 135 - 145 mmol/L -  Potassium 3.5 - 5.1 mmol/L 3.6  Chloride 98 - 111 mmol/L -  CO2 22 - 32 mmol/L -  Calcium 8.9 - 10.3 mg/dL -   CBC Latest Ref Rng & Units 09/25/2019  WBC 4.0 - 10.5 K/uL 4.8  Hemoglobin 12.0 - 15.0 g/dL 12.4  Hematocrit 36.0 - 46.0 % 41.0  Platelets 150 - 400 K/uL 164    No images are attached to the encounter.  NM Cardiac [place] Rest  Result Date: 11/01/2019 CLINICAL DATA:  Breast cancer. Evaluate cardiac function in relation to chemotherapy. EXAM: NUCLEAR MEDICINE CARDIAC BLOOD POOL IMAGING (MUGA) TECHNIQUE: Cardiac multi-gated acquisition was performed at rest following intravenous injection of Tc-56mlabeled red blood cells. RADIOPHARMACEUTICALS:  19.0 mCi Tc-941mertechnetate in-vitro labeled red blood cells IV COMPARISON:  None. FINDINGS: No  focal wall motion abnormality of the left ventricle. Calculated left ventricular ejection fraction equals 58% IMPRESSION: Left ventricular ejection fraction equals58 %. Electronically Signed   By: StNicole Kindred  Leonia Reeves M.D.   On: 11/01/2019 13:12   US  BREAST LTD UNI RIGHT INC AXILLA  Result Date: 10/14/2019 CLINICAL DATA:  Patient was called back from screening mammogram for right breast masses. EXAM: DIGITAL DIAGNOSTIC RIGHT MAMMOGRAM WITH CAD AND TOMO ULTRASOUND RIGHT BREAST COMPARISON:  Previous exam(s). ACR Breast Density Category b: There are scattered areas of fibroglandular density. FINDINGS: Additional imaging of the right breast was performed. There are 2 adjacent irregular masses in the upper-outer quadrant of the right breast measuring 1.3 and 0.8 cm. There are no malignant type microcalcifications. Mammographic images were processed with CAD. Targeted ultrasound is performed, showing 2 irregular hypoechoic masses in the right breast at 9 o'clock 9 cm from the nipple measuring 1.1 x 1.2 x 1.2 cm and 0.8 x 0.8 x 0.9 cm. The 2 masses are located 1.7 cm apart. Sonographic evaluation of the right axilla does not show any enlarged adenopathy. IMPRESSION: 2 suspicious masses in the upper-outer quadrant of the right breast. RECOMMENDATION: Ultrasound-guided core biopsies of the 2 masses in the upper-outer quadrant of the right breast is recommended. I have discussed the findings and recommendations with the patient. If applicable, a reminder letter will be sent to the patient regarding the next appointment. BI-RADS CATEGORY  5: Highly suggestive of malignancy. Electronically Signed   By: Lillia Mountain M.D.   On: 10/14/2019 14:31   MM DIAG BREAST TOMO UNI RIGHT  Result Date: 10/14/2019 CLINICAL DATA:  Patient was called back from screening mammogram for right breast masses. EXAM: DIGITAL DIAGNOSTIC RIGHT MAMMOGRAM WITH CAD AND TOMO ULTRASOUND RIGHT BREAST COMPARISON:  Previous exam(s). ACR Breast Density Category b: There are scattered areas of fibroglandular density. FINDINGS: Additional imaging of the right breast was performed. There are 2 adjacent irregular masses in the upper-outer quadrant of the right breast measuring 1.3 and 0.8 cm. There are no  malignant type microcalcifications. Mammographic images were processed with CAD. Targeted ultrasound is performed, showing 2 irregular hypoechoic masses in the right breast at 9 o'clock 9 cm from the nipple measuring 1.1 x 1.2 x 1.2 cm and 0.8 x 0.8 x 0.9 cm. The 2 masses are located 1.7 cm apart. Sonographic evaluation of the right axilla does not show any enlarged adenopathy. IMPRESSION: 2 suspicious masses in the upper-outer quadrant of the right breast. RECOMMENDATION: Ultrasound-guided core biopsies of the 2 masses in the upper-outer quadrant of the right breast is recommended. I have discussed the findings and recommendations with the patient. If applicable, a reminder letter will be sent to the patient regarding the next appointment. BI-RADS CATEGORY  5: Highly suggestive of malignancy. Electronically Signed   By: Lillia Mountain M.D.   On: 10/14/2019 14:31   MM CLIP PLACEMENT RIGHT  Result Date: 10/19/2019 CLINICAL DATA:  Status post ultrasound-guided core biopsies of the right breast. EXAM: DIAGNOSTIC RIGHT MAMMOGRAM POST ULTRASOUND BIOPSIES COMPARISON:  Previous exam(s). FINDINGS: Mammographic images were obtained following ultrasound guided biopsies of the right breast. Mammographic images show there is a coil shaped clip in the mass at 9 o'clock 7 cm from the nipple. There is a venous shaped clip 4 mm medial to the biopsied smaller mass at 9 o'clock 9 cm from the nipple. IMPRESSION: Status post ultrasound-guided core biopsy of 2 masses in the right breast. The coil shaped clip is in the mass at 9 o'clock 7 cm from the nipple. The venous shaped clip is adjacent to the mass at 9 o'clock 9 cm from the nipple. Final Assessment: Post Procedure  Mammograms for Marker Placement Electronically Signed   By: Lillia Mountain M.D.   On: 10/19/2019 14:12   [place] RT BREAST BX W LOC DEV 1ST LESION IMG BX SPEC US GUIDE  Addendum Date: 10/26/2019   ADDENDUM REPORT: 10/25/2019 11:36 ADDENDUM: PATHOLOGY revealed: A. BREAST,  RIGHT 9:00 7 CM FN; ULTRASOUND-GUIDED BIOPSY: - INVASIVE MAMMARY CARCINOMA, NO SPECIAL TYPE. : 9 mm in this sample. Grade 3. Ductal carcinoma in situ: Not identified. Lymphovascular invasion: Not identified. B. BREAST, RIGHT 9:00 9 CM FN; ULTRASOUND-GUIDED BIOPSY: - INVASIVE MAMMARY CARCINOMA, NO SPECIAL TYPE.: 8 mm in this sample. Grade 3. Ductal carcinoma in situ: Not identified. Lymphovascular invasion: Not identified Comment: The morphology of the two tumors is similar. ER/PR/HER2: Immunohistochemistry will be performed on block A1, with reflex to [place] for HER2 2+. The results will be reported in an addendum. Pathology results are CONCORDANT with imaging findings, per Dr. Lillia Mountain. I telephoned the patient on 10/24/2019 and discussed biopsy results and the recommendations stated below. All questions were answered. The patient denies significant pain or bleeding from the biopsy site. Biopsy site care instructions were reviewed and the patient was instructed to call [facility] with any concerns or questions related to the biopsy. Recommendation: Surgical referral. Request for surgical referral was relayed to nurse navigators at [facility] by Electa Sniff RN on 10/24/2019. Addendum by Electa Sniff RN on 10/25/2019. Electronically Signed   By: Lillia Mountain M.D.   On: 10/25/2019 11:36   Result Date: 10/26/2019 CLINICAL DATA:  Suspicious right breast masses. EXAM: ULTRASOUND GUIDED RIGHT BREAST CORE NEEDLE BIOPSIES COMPARISON:  Previous exam(s). FINDINGS: I met with the patient and we discussed the procedure of ultrasound-guided biopsy, including benefits and alternatives. We discussed the high likelihood of a successful procedure. We discussed the risks of the procedure, including infection, bleeding, tissue injury, clip migration, and inadequate sampling. Informed written consent was given. The usual time-out protocol was performed immediately prior to the procedure. Lesion quadrant: 9  o'clock (7 cm from the nipple) Using sterile technique and 1% lidocaine and 1% lidocaine with epinephrine as local anesthetic, under direct ultrasound visualization, a 12 gauge spring-loaded device was used to perform biopsy of a mass in the 9 o'clock region of the right breast 7 cm from the nipple using a lateral to medial approach. At the conclusion of the procedure coil shaped tissue marker clip was deployed into the biopsy cavity. Follow up 2 view mammogram was performed and dictated separately. Lesion quadrant: 9 o'clock (9 cm from the nipple) Using sterile technique and 1% Lidocaine as local anesthetic, under direct ultrasound visualization, a 12 gauge spring-loaded device was used to perform biopsy of a mass in the 9 o'clock region of the right breast (9 cm from the nipple) using a lateral to medial approach. At the conclusion of the procedure venous shaped tissue marker clip was deployed into the biopsy cavity. Follow up 2 view mammogram was performed and dictated separately. IMPRESSION: Ultrasound guided biopsies of the right breast. No apparent complications. Electronically Signed: By: Lillia Mountain M.D. On: 10/19/2019 14:12   [place] RT BREAST BX W LOC DEV EA ADD LESION IMG BX SPEC US GUIDE  Addendum Date: 10/26/2019   ADDENDUM REPORT: 10/25/2019 11:36 ADDENDUM: PATHOLOGY revealed: A. BREAST, RIGHT 9:00 7 CM FN; ULTRASOUND-GUIDED BIOPSY: - INVASIVE MAMMARY CARCINOMA, NO SPECIAL TYPE. : 9 mm in this sample. Grade 3. Ductal carcinoma in situ: Not identified. Lymphovascular invasion: Not identified. B. BREAST, RIGHT 9:00  9 CM FN; ULTRASOUND-GUIDED BIOPSY: - INVASIVE MAMMARY CARCINOMA, NO SPECIAL TYPE.: 8 mm in this sample. Grade 3. Ductal carcinoma in situ: Not identified. Lymphovascular invasion: Not identified Comment: The morphology of the two tumors is similar. ER/PR/HER2: Immunohistochemistry will be performed on block A1, with reflex to [place] for HER2 2+. The results will be reported in an addendum.  Pathology results are CONCORDANT with imaging findings, per Dr. Lillia Mountain. I telephoned the patient on 10/24/2019 and discussed biopsy results and the recommendations stated below. All questions were answered. The patient denies significant pain or bleeding from the biopsy site. Biopsy site care instructions were reviewed and the patient was instructed to call [facility] with any concerns or questions related to the biopsy. Recommendation: Surgical referral. Request for surgical referral was relayed to nurse navigators at [facility] by Electa Sniff RN on 10/24/2019. Addendum by Electa Sniff RN on 10/25/2019. Electronically Signed   By: Lillia Mountain M.D.   On: 10/25/2019 11:36   Result Date: 10/26/2019 CLINICAL DATA:  Suspicious right breast masses. EXAM: ULTRASOUND GUIDED RIGHT BREAST CORE NEEDLE BIOPSIES COMPARISON:  Previous exam(s). FINDINGS: I met with the patient and we discussed the procedure of ultrasound-guided biopsy, including benefits and alternatives. We discussed the high likelihood of a successful procedure. We discussed the risks of the procedure, including infection, bleeding, tissue injury, clip migration, and inadequate sampling. Informed written consent was given. The usual time-out protocol was performed immediately prior to the procedure. Lesion quadrant: 9 o'clock (7 cm from the nipple) Using sterile technique and 1% lidocaine and 1% lidocaine with epinephrine as local anesthetic, under direct ultrasound visualization, a 12 gauge spring-loaded device was used to perform biopsy of a mass in the 9 o'clock region of the right breast 7 cm from the nipple using a lateral to medial approach. At the conclusion of the procedure coil shaped tissue marker clip was deployed into the biopsy cavity. Follow up 2 view mammogram was performed and dictated separately. Lesion quadrant: 9 o'clock (9 cm from the nipple) Using sterile technique and 1% Lidocaine as local anesthetic,  under direct ultrasound visualization, a 12 gauge spring-loaded device was used to perform biopsy of a mass in the 9 o'clock region of the right breast (9 cm from the nipple) using a lateral to medial approach. At the conclusion of the procedure venous shaped tissue marker clip was deployed into the biopsy cavity. Follow up 2 view mammogram was performed and dictated separately. IMPRESSION: Ultrasound guided biopsies of the right breast. No apparent complications. Electronically Signed: By: Lillia Mountain M.D. On: 10/19/2019 14:12     Assessment and plan- Patient is a 63 y.o. female who presents to [facility] for initial meeting in preparation for starting chemotherapy for the treatment of Breast Cancer.   1. HPI: Patient is a 62 year old African-American female who underwent a routine screening mammogram on 10/06/2019.  It showed 2 suspicious lesions in the right breast.  Diagnostic mammogram and ultrasound confirmed 2 masses in the 9 o'clock position of the right breast measuring 1.1 x 1.2 x 1.2 cm and the other 1 measuring 0.8 x 0.8 x 0.9 cm.  These 2 masses were 1.7 cm apart.  Sonographic evaluation of the right axilla did not show any enlarged adenopathy.  Both suspicious masses were biopsied and came back positive for invasive mammary carcinoma grade 3 ER greater than 90% positive, PR 11 to 50% positive and HER-2 3+ positive.  She has no prior abnormal  mammograms or breast biopsies.  No family history of breast cancer.  Mother had cervical cancer.  Menarche at 13 years.  Menopause sometimes in her 18s but she only had a hysterectomy at that time.  No prior use of HRT.  She did take oral contraceptive pills for about 12 years in the past.  She lives with her husband and is independent of her ADLs and IADLs.  2. Chemo Care Clinic/High Risk for ER/Hospitalization during chemotherapy- We discussed the role of the chemo care clinic and identified patient specific risk factors. I discussed that patient  was identified as high risk primarily based on previous ED utilization and comorbidities.  Patient has past medical history positive for: Past Medical History:  Diagnosis Date  . Adverse effect of anesthesia    hard to awaken  . Anemia   . Arthritis   . Asthma   . Asthma without status asthmaticus    unspecified; Take advair each night  . Bronchitis   . Constipation   . Diabetes ([place]) 2010  . Diabetes mellitus type 2, uncomplicated ([place])   . Diabetes mellitus without complication ([place])   . Encounter for blood transfusion    hysterectomy 9-10 years ago  . Heart murmur    unspecified  . Hyperlipidemia   . Hypertension 1991  . Hypothyroid   . Multiple thyroid nodules   . Neuromuscular disorder (H[facility])    neuropathy  . Osteoarthritis of right hip 02/04/2017  . Palpitations   . Pernicious anemia   . Pneumonia   . Sleep apnea    Use C-PAP; can't use mouth piece. Anxiety  . Thyroid disease    nodules  . Thyroid nodule    x2  . Urinary urgency     Patient has past surgical history positive for: Past Surgical History:  Procedure Laterality Date  . ABDOMINAL HYSTERECTOMY  2011   partial  . ABDOMINAL SURGERY    . CARPAL TUNNEL RELEASE Left 2008  . CARPAL TUNNEL RELEASE Right   . CHOLECYSTECTOMY  1982  . COLONOSCOPY WITH PROPOFOL N/A 10/05/2017   Procedure: COLONOSCOPY WITH PROPOFOL;  Surgeon: Manya Silvas, MD;  Location: [facility] ENDOSCOPY;  Service: Endoscopy;  Laterality: N/A;  . DIAGNOSTIC LAPAROSCOPY    . JOINT REPLACEMENT Right 02/09/2017   TOTAL HIP REPLACEMENT, DR. CARTER. [place]  . KNEE ARTHROSCOPY  1990s  . TOTAL HIP ARTHROPLASTY Left 06/23/2017   Procedure: TOTAL HIP ARTHROPLASTY ANTERIOR APPROACH;  Surgeon: Hessie Knows, MD;  Location: [facility] ORS;  Service: Orthopedics;  Laterality: Left;    Based on our high risk symptom management report; this patient has a high risk of ED utilization.  The percentage below indicates how "at risk "  this patient based on  the factors in this table within one year.   Risk of Admission or ED Visit  This score indicates an adult patient's 1-year risk, as a percentage, of a hospital admission or ED visit.    0%  8%   5 months ago 3 months ago 8 weeks ago Today   2% 05/07/19 2000   8% 09/26/19 0102   8% 11/04/19 1205    Factor Value  Current PCP Kirk Ruths, [facility] Admissions 0  ED Visits 2   Has Medicaid No  Has Medicare No  In relationship Yes  Has Anemia Yes   Has asthma Yes   Has atrial fibrillation No  Has CVD No  Has chronic kidney disease No  Has Chronic Obstructive Pulmonary  Disease No  Has Congestive Heart Failure No  Has Connective Tissue Disorder No  Has Depression No  Has Diabetes Yes   Has liver disease No  Has Peripheral Vascular Disease No   3. We discussed that social determinants of health may have significant impacts on health and outcomes for cancer patients.  Today we discussed specific social determinants of performance status, alcohol use, depression, financial needs, food insecurity, housing, interpersonal violence, social connections, stress, tobacco use, and transportation.    After lengthy discussion we did not identify any areas of need, but she and her husband had several questions about medications and when to take.  We discussed what and when to take each medication.  I also instructed them they can call with any questions at any time.  We discussed options including home based and outpatient services, DME, and CARE program. We discusssed that patients who participate in regular physical activity report fewer negative impacts of cancer and treatments and report less fatigue.  We discussed self-referral to [facility] for counseling services, psychiatry for medication management, or palliative care/symptom management as well as primary care providers.  We discussed that living with cancer can create tremendous financial burden.  We discussed options for assistance. I  asked that if assistance is needed in affording medications or paying bills to please let us know so that we can provide assistance.  We discussed options for food including social services.  We will also notify [facility] to see if cancer center can provide support.  Patient informed of food pantry at cancer center and was provided with care package today.  Please notify nursing if un-met needs. We discussed referral to social work. Will also discuss with Elease Etienne to see if [place] can provide support of utility bills, etc.  We discussed options for support groups at the cancer center. If interested, please notify nurse navigator to enroll.  We discussed options for managing stress including healthy eating, exercise as well as participating in no charge counseling services at the cancer center and support groups.  If these are of interest, patient can notify either myself or primary nursing team. We discussed options for management including medications and referral to quit Smart program We discussed options for transportation including [employer], paratransit, bus routes, link transit, taxi/uber/lyft, and cancer center van.  I have notified primary oncology team who will help assist with arranging Lucianne Lei transportation for appointments when needed. We also discussed options for transportation on short notice/acute visits.   We also discussed the role of the Sympt[facility] Management [facility] at [facility] for acute issues and methods of contacting clinic/provider. She denies needing specific assistance at this time and She will be followed by Al Pimple, RN (Nurse Navigator).   Visit Diagnosis 1. Invasive carcinoma of breast [facility])     Patient expressed understanding and was in agreement with this plan. She also understands that She can call clinic at any time with any questions, concerns, or complaints.   A total of (25) minutes of face-to-face time was spent with this patient with greater than 50% of that time  in counseling and care-coordination.  [facility] [place]  CC: Dr. Janese Banks

## 2019-11-07 ENCOUNTER — Ambulatory Visit: Admitting: Certified Registered Nurse Anesthetist

## 2019-11-07 ENCOUNTER — Encounter
Admission: RE | Disposition: A | Discharge status: Home / Self Care | Payer: Self-pay | Source: Home / Self Care | Attending: General Surgery

## 2019-11-07 ENCOUNTER — Ambulatory Visit

## 2019-11-07 ENCOUNTER — Other Ambulatory Visit: Payer: Self-pay

## 2019-11-07 ENCOUNTER — Encounter

## 2019-11-07 ENCOUNTER — Encounter: Payer: Self-pay | Admitting: General Surgery

## 2019-11-07 ENCOUNTER — Ambulatory Visit
Admission: RE | Admit: 2019-11-07 | Discharge: 2019-11-07 | Disposition: A | Discharge status: Home / Self Care | Attending: General Surgery | Admitting: General Surgery

## 2019-11-07 DIAGNOSIS — Z7984 Long term (current) use of oral hypoglycemic drugs: Secondary | ICD-10-CM | POA: Diagnosis not present

## 2019-11-07 DIAGNOSIS — Z96643 Presence of artificial hip joint, bilateral: Secondary | ICD-10-CM | POA: Diagnosis not present

## 2019-11-07 DIAGNOSIS — Z888 Allergy status to other drugs, medicaments and biological substances status: Secondary | ICD-10-CM | POA: Insufficient documentation

## 2019-11-07 DIAGNOSIS — I10 Essential (primary) hypertension: Secondary | ICD-10-CM | POA: Diagnosis not present

## 2019-11-07 DIAGNOSIS — E119 Type 2 diabetes mellitus without complications: Secondary | ICD-10-CM | POA: Diagnosis not present

## 2019-11-07 DIAGNOSIS — Z7951 Long term (current) use of inhaled steroids: Secondary | ICD-10-CM | POA: Insufficient documentation

## 2019-11-07 DIAGNOSIS — E7800 Pure hypercholesterolemia, unspecified: Secondary | ICD-10-CM | POA: Insufficient documentation

## 2019-11-07 DIAGNOSIS — M1990 Unspecified osteoarthritis, unspecified site: Secondary | ICD-10-CM | POA: Insufficient documentation

## 2019-11-07 DIAGNOSIS — Z8249 Family history of ischemic heart disease and other diseases of the circulatory system: Secondary | ICD-10-CM | POA: Diagnosis not present

## 2019-11-07 DIAGNOSIS — G4730 Sleep apnea, unspecified: Secondary | ICD-10-CM | POA: Diagnosis not present

## 2019-11-07 DIAGNOSIS — C50511 Malignant neoplasm of lower-outer quadrant of right female breast: Secondary | ICD-10-CM | POA: Diagnosis present

## 2019-11-07 DIAGNOSIS — Z79899 Other long term (current) drug therapy: Secondary | ICD-10-CM | POA: Insufficient documentation

## 2019-11-07 DIAGNOSIS — Z90711 Acquired absence of uterus with remaining cervical stump: Secondary | ICD-10-CM | POA: Insufficient documentation

## 2019-11-07 DIAGNOSIS — Z95828 Presence of other vascular implants and grafts: Secondary | ICD-10-CM

## 2019-11-07 DIAGNOSIS — Z170 Estrogen receptor positive status [ER+]: Secondary | ICD-10-CM | POA: Diagnosis not present

## 2019-11-07 HISTORY — PX: PORTACATH PLACEMENT: SHX2246

## 2019-11-07 LAB — GLUCOSE, CAPILLARY
Glucose-Capillary: 99 mg/dL (ref 70–99)
Glucose-Capillary: 107 mg/dL — ABNORMAL HIGH (ref 70–99)

## 2019-11-07 SURGERY — INSERTION, TUNNELED CENTRAL VENOUS DEVICE, WITH PORT
Anesthesia: General | Site: Chest | Laterality: Left

## 2019-11-07 MED ORDER — LIDOCAINE HCL (PF) 2 % IJ SOLN
INTRAMUSCULAR | Status: AC
  Filled 2019-11-07: qty 10

## 2019-11-07 MED ORDER — GLYCOPYRROLATE 0.2 MG/ML IJ SOLN
INTRAMUSCULAR | Status: AC
  Filled 2019-11-07: qty 1

## 2019-11-07 MED ORDER — PHENYLEPHRINE HCL (PRESSORS) 10 MG/ML IV SOLN
INTRAVENOUS | Status: AC
  Filled 2019-11-07: qty 1

## 2019-11-07 MED ORDER — FAMOTIDINE 20 MG PO TABS
ORAL_TABLET | ORAL | Status: AC
  Administered 2019-11-07: 07:00:00 20 mg via ORAL
  Filled 2019-11-07: qty 1

## 2019-11-07 MED ORDER — BUPIVACAINE-EPINEPHRINE (PF) 0.25% -1:200000 IJ SOLN
INTRAMUSCULAR | Status: AC
  Filled 2019-11-07: qty 30

## 2019-11-07 MED ORDER — PROPOFOL 10 MG/ML IV BOLUS
INTRAVENOUS | Status: AC
  Filled 2019-11-07: qty 40

## 2019-11-07 MED ORDER — GLYCOPYRROLATE 0.2 MG/ML IJ SOLN
INTRAMUSCULAR | Status: DC | PRN
  Administered 2019-11-07: .2 mg via INTRAVENOUS

## 2019-11-07 MED ORDER — SODIUM CHLORIDE 0.9 % IV SOLN
INTRAVENOUS | Status: DC
  Administered 2019-11-07: 07:00:00 via INTRAVENOUS

## 2019-11-07 MED ORDER — ONDANSETRON HCL 4 MG/2ML IJ SOLN: 4.0000 mg | Freq: Once | INTRAMUSCULAR | Status: DC | PRN

## 2019-11-07 MED ORDER — BUPIVACAINE-EPINEPHRINE (PF) 0.25% -1:200000 IJ SOLN
INTRAMUSCULAR | Status: DC | PRN
  Administered 2019-11-07: 14 mL

## 2019-11-07 MED ORDER — SODIUM CHLORIDE FLUSH 0.9 % IV SOLN
INTRAVENOUS | Status: AC
  Filled 2019-11-07: qty 10

## 2019-11-07 MED ORDER — FENTANYL CITRATE (PF) 100 MCG/2ML IJ SOLN
INTRAMUSCULAR | Status: AC
  Filled 2019-11-07: qty 2

## 2019-11-07 MED ORDER — SODIUM CHLORIDE (PF) 0.9 % IJ SOLN
INTRAMUSCULAR | Status: AC
  Filled 2019-11-07: qty 50

## 2019-11-07 MED ORDER — MIDAZOLAM HCL 2 MG/2ML IJ SOLN
INTRAMUSCULAR | Status: DC | PRN
  Administered 2019-11-07 (×2): 1 mg via INTRAVENOUS

## 2019-11-07 MED ORDER — PROPOFOL 10 MG/ML IV BOLUS
INTRAVENOUS | Status: AC
  Filled 2019-11-07: qty 20

## 2019-11-07 MED ORDER — HYDROCODONE-ACETAMINOPHEN 5-325 MG PO TABS: 1.0000 | ORAL_TABLET | ORAL | 0 refills | Status: DC | PRN

## 2019-11-07 MED ORDER — FENTANYL CITRATE (PF) 100 MCG/2ML IJ SOLN
INTRAMUSCULAR | Status: AC
  Administered 2019-11-07: 25 ug via INTRAVENOUS
  Filled 2019-11-07: qty 2

## 2019-11-07 MED ORDER — CEFAZOLIN SODIUM-DEXTROSE 2-4 GM/100ML-% IV SOLN
INTRAVENOUS | Status: AC
  Filled 2019-11-07: qty 100

## 2019-11-07 MED ORDER — PROPOFOL 500 MG/50ML IV EMUL
INTRAVENOUS | Status: DC | PRN
  Administered 2019-11-07: 50 ug/kg/min via INTRAVENOUS

## 2019-11-07 MED ORDER — FENTANYL CITRATE (PF) 100 MCG/2ML IJ SOLN
25.0000 ug | INTRAMUSCULAR | Status: DC | PRN
  Administered 2019-11-07 (×3): 25 ug via INTRAVENOUS

## 2019-11-07 MED ORDER — MIDAZOLAM HCL 2 MG/2ML IJ SOLN
INTRAMUSCULAR | Status: AC
  Filled 2019-11-07: qty 2

## 2019-11-07 MED ORDER — CEFAZOLIN SODIUM-DEXTROSE 2-4 GM/100ML-% IV SOLN
2.0000 g | INTRAVENOUS | Status: AC
  Administered 2019-11-07: 2 g via INTRAVENOUS

## 2019-11-07 MED ORDER — FAMOTIDINE 20 MG PO TABS: 20.0000 mg | ORAL_TABLET | Freq: Once | ORAL | Status: AC

## 2019-11-07 MED ORDER — HEPARIN SODIUM (PORCINE) 5000 UNIT/ML IJ SOLN
INTRAMUSCULAR | Status: AC
  Filled 2019-11-07: qty 1

## 2019-11-07 MED ORDER — FENTANYL CITRATE (PF) 100 MCG/2ML IJ SOLN
INTRAMUSCULAR | Status: DC | PRN
  Administered 2019-11-07: 25 ug via INTRAVENOUS
  Administered 2019-11-07: 50 ug via INTRAVENOUS
  Administered 2019-11-07: 25 ug via INTRAVENOUS

## 2019-11-07 MED ORDER — SUCCINYLCHOLINE CHLORIDE 20 MG/ML IJ SOLN
INTRAMUSCULAR | Status: AC
  Filled 2019-11-07: qty 1

## 2019-11-07 MED ORDER — LACTATED RINGERS IV SOLN
INTRAVENOUS | Status: DC | PRN
  Administered 2019-11-07: 07:00:00 via INTRAVENOUS

## 2019-11-07 SURGICAL SUPPLY — 31 items
BLADE SURG SZ11 CARB STEEL (BLADE) ×3 IMPLANT
BOOT SUTURE AID YELLOW STND (SUTURE) ×3 IMPLANT
CANISTER SUCT 1200ML W/VALVE (MISCELLANEOUS) ×3 IMPLANT
CHLORAPREP W/TINT 26 (MISCELLANEOUS) ×3 IMPLANT
COVER LIGHT HANDLE STERIS (MISCELLANEOUS) ×6 IMPLANT
COVER WAND RF STERILE (DRAPES) ×3 IMPLANT
DERMABOND ADVANCED (GAUZE/BANDAGES/DRESSINGS) ×2
DERMABOND ADVANCED .7 DNX12 (GAUZE/BANDAGES/DRESSINGS) ×1 IMPLANT
DRAPE C-ARM XRAY 36X54 (DRAPES) ×3 IMPLANT
ELECT REM PT RETURN 9FT ADLT (ELECTROSURGICAL) ×3
ELECTRODE REM PT RTRN 9FT ADLT (ELECTROSURGICAL) ×1 IMPLANT
GLOVE BIO SURGEON STRL SZ 6.5 (GLOVE) ×2 IMPLANT
GLOVE BIO SURGEONS STRL SZ 6.5 (GLOVE) ×1
GLOVE BIOGEL PI IND STRL 6.5 (GLOVE) ×1 IMPLANT
GLOVE BIOGEL PI INDICATOR 6.5 (GLOVE) ×2
GOWN STRL REUS W/ TWL LRG LVL3 (GOWN DISPOSABLE) ×3 IMPLANT
GOWN STRL REUS W/TWL LRG LVL3 (GOWN DISPOSABLE) ×6
KIT PORT POWER 8FR ISP CVUE (Port) ×3 IMPLANT
KIT TURNOVER KIT A (KITS) ×3 IMPLANT
LABEL OR SOLS (LABEL) ×3 IMPLANT
NEEDLE FILTER BLUNT 18X 1/2SAF (NEEDLE) ×2
NEEDLE FILTER BLUNT 18X1 1/2 (NEEDLE) ×1 IMPLANT
PACK PORT-A-CATH (MISCELLANEOUS) ×3 IMPLANT
SUT MNCRL AB 4-0 PS2 18 (SUTURE) ×3 IMPLANT
SUT PROLENE 2 0 FS (SUTURE) ×3 IMPLANT
SUT VIC AB 2-0 SH 27 (SUTURE) ×2
SUT VIC AB 2-0 SH 27XBRD (SUTURE) ×1 IMPLANT
SUT VIC AB 3-0 SH 27 (SUTURE) ×2
SUT VIC AB 3-0 SH 27X BRD (SUTURE) ×1 IMPLANT
SYR 10ML LL (SYRINGE) ×3 IMPLANT
SYR 3ML LL SCALE MARK (SYRINGE) ×3 IMPLANT

## 2019-11-07 NOTE — Anesthesia Postprocedure Evaluation (Signed)
Anesthesia Post Note  Patient: Christina Mcclain  Procedure(s) Performed: INSERTION PORT-A-CATH (Left Chest)  Patient location during evaluation: PACU Anesthesia Type: General Level of consciousness: awake and alert and oriented Pain management: pain level controlled Vital Signs Assessment: post-procedure vital signs reviewed and stable Respiratory status: spontaneous breathing, nonlabored ventilation and respiratory function stable Cardiovascular status: blood pressure returned to baseline and stable Postop Assessment: no signs of nausea or vomiting Anesthetic complications: no     Last Vitals:  Vitals:   11/07/19 0940 11/07/19 0945  BP: 109/65 102/62  Pulse: (!) 52 (!) 56  Resp: 11 14  Temp:  (!) 36 C  SpO2: 96% 98%    Last Pain:  Vitals:   11/07/19 0945  TempSrc: Temporal  PainSc: 1                  Romney Compean

## 2019-11-07 NOTE — Anesthesia Post-op Follow-up Note (Signed)
Anesthesia QCDR form completed.        

## 2019-11-07 NOTE — Interval H&P Note (Signed)
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  The various methods of treatment have been discussed with the patient and family. After consideration of risks, benefits and other options for treatment, the patient has consented to  Procedure(s): INSERTION PORT-A-CATH (N/A) as a surgical intervention.  The patient's history has been reviewed, patient examined, no change in status, stable for surgery.  I have reviewed the patient's chart and labs.  Questions were answered to the patient's satisfaction.     Herbert Pun

## 2019-11-07 NOTE — Transfer of Care (Signed)
Immediate Anesthesia Transfer of Care Note  Patient: Christina Mcclain  Procedure(s) Performed: INSERTION PORT-A-CATH (Left Chest)  Patient Location: PACU  Anesthesia Type:General  Level of Consciousness: awake and alert   Airway & Oxygen Therapy: Patient Spontanous Breathing and Patient connected to face mask oxygen  Post-op Assessment: Report given to RN and Post -op Vital signs reviewed and stable  Post vital signs: Reviewed and stable  Last Vitals:  Vitals Value Taken Time  BP    Temp    Pulse 68   Resp 15   SpO2 100     Last Pain:  Vitals:   11/07/19 0709  TempSrc: Tympanic  PainSc: 0-No pain         Complications: No apparent anesthesia complications

## 2019-11-07 NOTE — Anesthesia Procedure Notes (Signed)
Performed by: Jaxsun Ciampi, CRNA Pre-anesthesia Checklist: Patient identified, Emergency Drugs available, Suction available, Patient being monitored and Timeout performed Patient Re-evaluated:Patient Re-evaluated prior to induction Oxygen Delivery Method: Simple face mask Induction Type: IV induction       

## 2019-11-07 NOTE — Discharge Instructions (Signed)
  Diet: Resume home heart healthy regular diet.   Activity: Increase activity as tolerated. Light activity and walking are encouraged. Do not drive or drink alcohol if taking narcotic pain medications.  Wound care: May shower with soapy water and pat dry (do not rub incisions), but no baths or submerging incision underwater until follow-up. (no swimming)   Medications: Resume all home medications. For mild to moderate pain: acetaminophen (Tylenol) or ibuprofen (if no kidney disease). Combining Tylenol with alcohol can substantially increase your risk of causing liver disease. Narcotic pain medications, if prescribed, can be used for severe pain, though may cause nausea, constipation, and drowsiness. Do not combine Tylenol and Norco within a 6 hour period as Norco contains Tylenol. If you do not need the narcotic pain medication, you do not need to fill the prescription.  Call office ([phone]) at any time if any questions, worsening pain, fevers/chills, bleeding, drainage from incision site, or other concerns.  

## 2019-11-07 NOTE — Anesthesia Preprocedure Evaluation (Signed)
Anesthesia Evaluation  Patient identified by MRN, date of birth, ID band Patient awake    Reviewed: Allergy & Precautions, NPO status , Patient's Chart, lab work & pertinent test results  History of Anesthesia Complications (+) PROLONGED EMERGENCE and history of anesthetic complications  Airway Mallampati: III  TM Distance: >3 FB Neck ROM: Full    Dental no notable dental hx.    Pulmonary asthma , sleep apnea ,    breath sounds clear to auscultation- rhonchi (-) wheezing      Cardiovascular Exercise Tolerance: Good hypertension, (-) CAD, (-) Past MI, (-) Cardiac Stents and (-) CABG  Rhythm:Regular Rate:Normal - Systolic murmurs and - Diastolic murmurs    Neuro/Psych neg Seizures negative neurological ROS  negative psych ROS   GI/Hepatic negative GI ROS, Neg liver ROS,   Endo/Other  diabetes, Oral Hypoglycemic AgentsHypothyroidism   Renal/GU negative Renal ROS     Musculoskeletal  (+) Arthritis ,   Abdominal (+) + obese,   Peds  Hematology  (+) anemia ,   Anesthesia Other Findings Past Medical History: No date: Adverse effect of anesthesia     Comment:  hard to awaken No date: Anemia No date: Arthritis No date: Asthma No date: Asthma without status asthmaticus     Comment:  unspecified; Take advair each night No date: Bronchitis No date: Constipation 2010: Diabetes ([place]) No date: Diabetes mellitus type 2, uncomplicated ([facility]) No date: Diabetes mellitus without complication ([place]) No date: Encounter for blood transfusion     Comment:  hysterectomy 9-10 years ago No date: Heart murmur     Comment:  unspecified No date: Hyperlipidemia 1991: Hypertension No date: Hypothyroid No date: Multiple thyroid nodules No date: Neuromuscular disorder ([place])     Comment:  neuropathy 02/04/2017: Osteoarthritis of right hip No date: Palpitations No date: Pernicious anemia No date: Pneumonia No date: Sleep apnea  Comment:  Use C-PAP; can't use mouth piece. Anxiety No date: Thyroid disease     Comment:  nodules No date: Thyroid nodule     Comment:  x2 No date: Urinary urgency   Reproductive/Obstetrics                             Anesthesia Physical Anesthesia Plan  ASA: III  Anesthesia Plan: General   Post-op Pain Management:    Induction: Intravenous  PONV Risk Score and Plan: 2 and Propofol infusion  Airway Management Planned: Natural Airway  Additional Equipment:   Intra-op Plan:   Post-operative Plan:   Informed Consent: I have reviewed the patients History and Physical, chart, labs and discussed the procedure including the risks, benefits and alternatives for the proposed anesthesia with the patient or authorized representative who has indicated his/her understanding and acceptance.     Dental advisory given  Plan Discussed with: CRNA and Anesthesiologist  Anesthesia Plan Comments:         Anesthesia Quick Evaluation

## 2019-11-07 NOTE — Op Note (Signed)
SURGICAL PROCEDURE REPORT  DATE OF PROCEDURE: 11/07/2019   SURGEON: Dr. Windell Moment   ANESTHESIA: Local with light IV sedation   PRE-OPERATIVE DIAGNOSIS: Advanced breast cancer requiring durable central venous access for chemotherapy   POST-OPERATIVE DIAGNOSIS: Advanced breast cancer requiring durable central venous access for chemotherapy  PROCEDURE(S):  1.) Percutaneous access of Left internal jugular vein under ultrasound guidance 2.) Insertion of tunneled Left internal jugular central venous catheter with subcutaneous port  INTRAOPERATIVE FINDINGS: Patent easily compressible Left internal jugular vein with appropriate respiratory variations and well-secured tunneled central venous catheter with subcutaneous port at completion of the procedure  ESTIMATED BLOOD LOSS: Minimal (<20 mL)   SPECIMENS: None   IMPLANTS: 17F tunneled Bard PowerPort central venous catheter with subcutaneous port  DRAINS: None   COMPLICATIONS: None apparent   CONDITION AT COMPLETION: Hemodynamically stable, awake   DISPOSITION: PACU   INDICATION(S) FOR PROCEDURE:  Patient is a 63 y.o. female who presented with advanced breast cancer requiring durable central venous access for chemotherapy. All risks, benefits, and alternatives to above elective procedures were discussed with the patient, who elected to proceed, and informed consent was accordingly obtained at that time.  DETAILS OF PROCEDURE:  Patient was brought to the operative suite and appropriately identified. In Trendelenburg position, Left IJ venous access site was prepped and draped in the usual sterile fashion, and following a brief timeout, percutaneous Left IJ venous access was obtained under ultrasound guidance using Seldinger technique, by which local anesthetic was injected over the Left IJ vein, and access needle was inserted under direct ultrasound visualization into the Left IJ vein, through which soft guidewire was advanced, over  which access needle was withdrawn. Guidewire was secured, attention was directed to injection of local anesthetic along the planned tunnel site, 2-3 cm transverse Left chest incision was made and confirmed to accommodate the subcutaneous port, and flushed catheter was tunneled retrograde from the port site over the Left chest to the Left IJ access site with the attached port well-secured to the catheter and within the subcutaneous pocket. Insertion sheath was advanced over the guidewire, which was withdrawn along with the insertion sheath dilator. The catheter was introduced through the sheath and left on the [place] junction under fluoro guidance and catheter cut to desire lenght. Catheter connected to port and fixed to the pocket on two side to avoid twisting. Port was confirmed to withdraw blood and flush easily, after which concentrated heparin was instilled into the port and catheter. Dermis at the subcutaneous pocket was re-approximated using buried interrupted 3-0 Vicryl suture, and 4-0 Monocryl suture was used to re-approximate skin at the insertion and subcutaneous port sites in running subcuticular fashion for the subcutaneous port and buried interrupted fashion for the insertion site. Skin was cleaned, dried, and sterile skin glue was applied. Patient was then safely transferred to PACU for a chest x-ray. Ultrasound images are available on paper chart and Fluoroscopy guidance images are available in Epic.

## 2019-11-08 ENCOUNTER — Ambulatory Visit
Admission: RE | Admit: 2019-11-08 | Discharge: 2019-11-08 | Disposition: A | Discharge status: Home / Self Care | Source: Ambulatory Visit | Attending: Oncology | Admitting: Oncology

## 2019-11-08 DIAGNOSIS — C50919 Malignant neoplasm of unspecified site of unspecified female breast: Secondary | ICD-10-CM | POA: Diagnosis present

## 2019-11-08 MED ORDER — GADOBUTROL 1 MMOL/ML IV SOLN
9.0000 mL | Freq: Once | INTRAVENOUS | Status: AC | PRN
  Administered 2019-11-08: 9 mL via INTRAVENOUS

## 2019-11-09 ENCOUNTER — Other Ambulatory Visit: Payer: Self-pay

## 2019-11-09 ENCOUNTER — Encounter: Payer: Self-pay | Admitting: Oncology

## 2019-11-09 DIAGNOSIS — R102 Pelvic and perineal pain: Secondary | ICD-10-CM | POA: Insufficient documentation

## 2019-11-09 DIAGNOSIS — R002 Palpitations: Secondary | ICD-10-CM | POA: Insufficient documentation

## 2019-11-09 NOTE — Progress Notes (Signed)
Patient stated that she went to the dentist yesterday and had dental work and was given antibiotic for 7 days. Patient would like to know if she should continue or stop taking it.

## 2019-11-10 ENCOUNTER — Inpatient Hospital Stay

## 2019-11-10 ENCOUNTER — Inpatient Hospital Stay (HOSPITAL_BASED_OUTPATIENT_CLINIC_OR_DEPARTMENT_OTHER): Admitting: Oncology

## 2019-11-10 ENCOUNTER — Other Ambulatory Visit: Payer: Self-pay

## 2019-11-10 ENCOUNTER — Ambulatory Visit

## 2019-11-10 VITALS — BP 145/72 | HR 65 | Temp 95.7°F | Resp 16 | Ht 62.0 in | Wt 209.9 lb

## 2019-11-10 VITALS — BP 172/73 | HR 68 | Temp 97.6°F | Resp 18

## 2019-11-10 DIAGNOSIS — Z5112 Encounter for antineoplastic immunotherapy: Secondary | ICD-10-CM

## 2019-11-10 DIAGNOSIS — Z95828 Presence of other vascular implants and grafts: Secondary | ICD-10-CM

## 2019-11-10 DIAGNOSIS — E876 Hypokalemia: Secondary | ICD-10-CM | POA: Diagnosis not present

## 2019-11-10 DIAGNOSIS — Z5111 Encounter for antineoplastic chemotherapy: Secondary | ICD-10-CM

## 2019-11-10 DIAGNOSIS — C50919 Malignant neoplasm of unspecified site of unspecified female breast: Secondary | ICD-10-CM

## 2019-11-10 LAB — CBC WITH DIFFERENTIAL/PLATELET
Abs Immature Granulocytes: 0.01 10*3/uL (ref 0.00–0.07)
Basophils Absolute: 0 10*3/uL (ref 0.0–0.1)
Basophils Relative: 1 %
Eosinophils Absolute: 0.1 10*3/uL (ref 0.0–0.5)
Eosinophils Relative: 2 %
HCT: 37.8 % (ref 36.0–46.0)
Hemoglobin: 11.3 g/dL — ABNORMAL LOW (ref 12.0–15.0)
Immature Granulocytes: 0 %
Lymphocytes Relative: 28 %
Lymphs Abs: 1.8 10*3/uL (ref 0.7–4.0)
MCH: 24.2 pg — ABNORMAL LOW (ref 26.0–34.0)
MCHC: 29.9 g/dL — ABNORMAL LOW (ref 30.0–36.0)
MCV: 80.9 fL (ref 80.0–100.0)
Monocytes Absolute: 0.6 10*3/uL (ref 0.1–1.0)
Monocytes Relative: 10 %
Neutro Abs: 3.7 10*3/uL (ref 1.7–7.7)
Neutrophils Relative %: 59 %
Platelets: 172 10*3/uL (ref 150–400)
RBC: 4.67 MIL/uL (ref 3.87–5.11)
RDW: 16.1 % — ABNORMAL HIGH (ref 11.5–15.5)
WBC: 6.3 10*3/uL (ref 4.0–10.5)
nRBC: 0 % (ref 0.0–0.2)

## 2019-11-10 LAB — COMPREHENSIVE METABOLIC PANEL
ALT: 18 U/L (ref 0–44)
AST: 22 U/L (ref 15–41)
Albumin: 3.9 g/dL (ref 3.5–5.0)
Alkaline Phosphatase: 85 U/L (ref 38–126)
Anion gap: 10 (ref 5–15)
BUN: 24 mg/dL — ABNORMAL HIGH (ref 8–23)
CO2: 29 mmol/L (ref 22–32)
Calcium: 9.6 mg/dL (ref 8.9–10.3)
Chloride: 102 mmol/L (ref 98–111)
Creatinine, Ser: 0.85 mg/dL (ref 0.44–1.00)
GFR calc Af Amer: >60 mL/min (ref >60)
GFR calc non Af Amer: >60 mL/min (ref >60)
Glucose, Bld: 111 mg/dL — ABNORMAL HIGH (ref 70–99)
Potassium: 3.3 mmol/L — ABNORMAL LOW (ref 3.5–5.1)
Sodium: 141 mmol/L (ref 135–145)
Total Bilirubin: 0.6 mg/dL (ref 0.3–1.2)
Total Protein: 7 g/dL (ref 6.5–8.1)

## 2019-11-10 MED ORDER — SODIUM CHLORIDE 0.9 % IV SOLN
10.0000 mg | Freq: Once | INTRAVENOUS | Status: AC
  Administered 2019-11-10: 10 mg via INTRAVENOUS
  Filled 2019-11-10: qty 1

## 2019-11-10 MED ORDER — DIPHENHYDRAMINE HCL 25 MG PO CAPS
50.0000 mg | ORAL_CAPSULE | Freq: Once | ORAL | Status: AC
  Administered 2019-11-10: 50 mg via ORAL
  Filled 2019-11-10: qty 2

## 2019-11-10 MED ORDER — SODIUM CHLORIDE 0.9% FLUSH
10.0000 mL | Freq: Once | INTRAVENOUS | Status: AC
  Administered 2019-11-10: 10 mL via INTRAVENOUS
  Filled 2019-11-10: qty 10

## 2019-11-10 MED ORDER — TRASTUZUMAB-DKST CHEMO 150 MG IV SOLR
750.0000 mg | Freq: Once | INTRAVENOUS | Status: AC
  Administered 2019-11-10: 750 mg via INTRAVENOUS
  Filled 2019-11-10: qty 35.72

## 2019-11-10 MED ORDER — SODIUM CHLORIDE 0.9 % IV SOLN
Freq: Once | INTRAVENOUS | Status: AC
  Filled 2019-11-10: qty 250

## 2019-11-10 MED ORDER — SODIUM CHLORIDE 0.9 % IV SOLN
75.0000 mg/m2 | Freq: Once | INTRAVENOUS | Status: AC
  Administered 2019-11-10: 150 mg via INTRAVENOUS
  Filled 2019-11-10: qty 15

## 2019-11-10 MED ORDER — POTASSIUM CHLORIDE CRYS ER 20 MEQ PO TBCR: 20.0000 meq | EXTENDED_RELEASE_TABLET | Freq: Every day | ORAL | 1 refills | Status: DC

## 2019-11-10 MED ORDER — PEGFILGRASTIM 6 MG/0.6ML ~~LOC~~ PSKT
6.0000 mg | PREFILLED_SYRINGE | Freq: Once | SUBCUTANEOUS | Status: AC
  Administered 2019-11-10: 6 mg via SUBCUTANEOUS
  Filled 2019-11-10: qty 0.6

## 2019-11-10 MED ORDER — HEPARIN SOD (PORK) LOCK FLUSH 100 UNIT/ML IV SOLN
500.0000 [IU] | Freq: Once | INTRAVENOUS | Status: AC | PRN
  Administered 2019-11-10: 500 [IU]
  Filled 2019-11-10: qty 5

## 2019-11-10 MED ORDER — SODIUM CHLORIDE 0.9 % IV SOLN
627.5000 mg | Freq: Once | INTRAVENOUS | Status: AC
  Administered 2019-11-10: 630 mg via INTRAVENOUS
  Filled 2019-11-10: qty 63

## 2019-11-10 MED ORDER — PALONOSETRON HCL INJECTION 0.25 MG/5ML
0.2500 mg | Freq: Once | INTRAVENOUS | Status: AC
  Administered 2019-11-10: 0.25 mg via INTRAVENOUS
  Filled 2019-11-10: qty 5

## 2019-11-10 MED ORDER — SODIUM CHLORIDE 0.9 % IV SOLN
150.0000 mg | Freq: Once | INTRAVENOUS | Status: AC
  Administered 2019-11-10: 150 mg via INTRAVENOUS
  Filled 2019-11-10: qty 5

## 2019-11-10 MED ORDER — SODIUM CHLORIDE 0.9 % IV SOLN
840.0000 mg | Freq: Once | INTRAVENOUS | Status: AC
  Administered 2019-11-10: 840 mg via INTRAVENOUS
  Filled 2019-11-10: qty 28

## 2019-11-10 MED ORDER — ACETAMINOPHEN 325 MG PO TABS
650.0000 mg | ORAL_TABLET | Freq: Once | ORAL | Status: AC
  Administered 2019-11-10: 650 mg via ORAL
  Filled 2019-11-10: qty 2

## 2019-11-10 NOTE — Progress Notes (Signed)
Hematology/Oncology Consult note [facility]  Telephone:([phone] [phone]  Patient Care Team: Kirk Ruths, MD as PCP - General (Internal Medicine) Theodore Demark, RN as Registered Nurse (Oncology)   Name of the patient: Christina Mcclain  [id number]  August 30, 1956   Date of visit: 11/10/19  Diagnosis- invasive mammary carcinoma of the right breast clinical prognostic stage Ia mcT1c cN0 cM0 ER/PR positive HER-2 positive   Chief complaint/ Reason for visit-on treatment assessment prior to cycle one of neoadjuvant TCHP chemotherapy  Heme/Onc history: patient is a 63 year old African-American female who underwent a routine screening mammogram on 10/06/2019. It showed 2 suspicious lesions in the right breast. Diagnostic mammogram and ultrasound confirmed 2 masses in the 9 o'clock position of the right breast measuring 1.1 x 1.2 x 1.2 cm and the other one measuring 0.8 x 0.8 x 0.9 cm. These 2 masses were 1.7 cm apart. Sonographic evaluation of the right axilla did not show any enlarged adenopathy. 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   Baseline MUGA scan showed EF of 58%.  MRI showed two distinct masses in the right breast 1.5 cm and 1.2 cm 2 cm apart no additional suspicious masses or abnormal enhancement identified.  No suspicious adenopathy  Interval history-patient is a little nervous starting chemotherapy today but is otherwise doing well and denies any complaints at this time  ECOG PS- 1 Pain scale- 0   Review of systems- Review of Systems  Constitutional: Negative for chills, fever, malaise/fatigue and weight loss.  HENT: Negative for congestion, ear discharge and nosebleeds.   Eyes: Negative for blurred vision.  Respiratory: Negative for cough, hemoptysis, sputum production, shortness of breath  and wheezing.   Cardiovascular: Negative for chest pain, palpitations, orthopnea and claudication.  Gastrointestinal: Negative for abdominal pain, blood in stool, constipation, diarrhea, heartburn, melena, nausea and vomiting.  Genitourinary: Negative for dysuria, flank pain, frequency, hematuria and urgency.  Musculoskeletal: Negative for back pain, joint pain and myalgias.  Skin: Negative for rash.  Neurological: Negative for dizziness, tingling, focal weakness, seizures, weakness and headaches.  Endo/Heme/Allergies: Does not bruise/bleed easily.  Psychiatric/Behavioral: Negative for depression and suicidal ideas. The patient is nervous/anxious. The patient does not have insomnia.        Allergies  Allergen Reactions  . Statins     Other reaction(s): Muscle Pain Possible hives also     Past Medical History:  Diagnosis Date  . Adverse effect of anesthesia    hard to awaken  . Anemia   . Arthritis   . Asthma   . Asthma without status asthmaticus    unspecified; Take advair each night  . Bronchitis   . Constipation   . Diabetes ([place]) 2010  . Diabetes mellitus type 2, uncomplicated ([place])   . Diabetes mellitus without complication ([place])   . Encounter for blood transfusion    hysterectomy 9-10 years ago  . Heart murmur    unspecified  . Hyperlipidemia   . Hypertension 1991  . Hypothyroid   . Multiple thyroid nodules   . Neuromuscular disorder (HCC)    neuropathy  . Osteoarthritis of right hip 02/04/2017  . Palpitations   . Pernicious anemia   . Pneumonia   . Sleep apnea    Use C-PAP; can't use mouth piece. Anxiety  . Thyroid disease    nodules  .  Thyroid nodule    x2  . Urinary urgency      Past Surgical History:  Procedure Laterality Date  . ABDOMINAL HYSTERECTOMY  2011   partial  . ABDOMINAL SURGERY    . CARPAL TUNNEL RELEASE Left 2008  . CARPAL TUNNEL RELEASE Right   . CHOLECYSTECTOMY  1982  . COLONOSCOPY WITH PROPOFOL N/A 10/05/2017   Procedure:  COLONOSCOPY WITH PROPOFOL;  Surgeon: Manya Silvas, MD;  Location: [facility] ENDOSCOPY;  Service: Endoscopy;  Laterality: N/A;  . DIAGNOSTIC LAPAROSCOPY    . JOINT REPLACEMENT Right 02/09/2017   TOTAL HIP REPLACEMENT, DR. CARTER. [place]  . KNEE ARTHROSCOPY  1990s  . PORTACATH PLACEMENT Left 11/07/2019   Procedure: INSERTION PORT-A-CATH;  Surgeon: Herbert Pun, MD;  Location: [facility] ORS;  Service: General;  Laterality: Left;  . TOTAL HIP ARTHROPLASTY Left 06/23/2017   Procedure: TOTAL HIP ARTHROPLASTY ANTERIOR APPROACH;  Surgeon: Hessie Knows, MD;  Location: [facility] ORS;  Service: Orthopedics;  Laterality: Left;    Social History   Socioeconomic History  . Marital status: Married    Spouse name: Not on file  . Number of children: 2  . Years of education: some college  . Highest education level: Not on file  Occupational History  . Occupation: retired    Comment: [employer]  Tobacco Use  . Smoking status: Never Smoker  . Smokeless tobacco: Never Used  Substance and Sexual Activity  . Alcohol use: No  . Drug use: No  . Sexual activity: Not on file  Other Topics Concern  . Not on file  Social History Narrative  . Not on file   Social Determinants of Health   Financial Resource Strain:   . Difficulty of Paying Living Expenses: Not on file  Food Insecurity:   . Worried About Charity fundraiser in the Last Year: Not on file  . Ran Out of Food in the Last Year: Not on file  Transportation Needs:   . Lack of Transportation (Medical): Not on file  . Lack of Transportation (Non-Medical): Not on file  Physical Activity:   . Days of Exercise per Week: Not on file  . Minutes of Exercise per Session: Not on file  Stress:   . Feeling of Stress : Not on file  Social Connections:   . Frequency of Communication with Friends and Family: Not on file  . Frequency of Social Gatherings with Friends and Family: Not on file  . Attends Religious Services: Not on file    . Active Member of Clubs or Organizations: Not on file  . Attends Archivist Meetings: Not on file  . Marital Status: Not on file  Intimate Partner Violence:   . Fear of Current or Ex-Partner: Not on file  . Emotionally Abused: Not on file  . Physically Abused: Not on file  . Sexually Abused: Not on file    Family History  Problem Relation Age of Onset  . Heart disease Mother   . Hypertension Mother   . Arrhythmia Mother   . Heart attack Mother   . Diabetes Mother   . Hypertension Father   . Parkinson's disease Father   . Coronary artery disease Maternal Uncle   . Heart attack Maternal Uncle   . Diabetes Sister   . Diabetes Brother   . Heart disease Brother   . Breast cancer Neg Hx      Current Outpatient Medications:  .  albuterol (PROVENTIL HFA;VENTOLIN HFA) 108 (90 Base)  MCG/ACT inhaler, Inhale 2 puffs into the lungs every 6 (six) hours as needed for wheezing or shortness of breath., Disp: , Rfl:  .  amoxicillin-clavulanate (AUGMENTIN) 500-125 MG tablet, Take 1 tablet by mouth 3 (three) times daily., Disp: , Rfl:  .  carvedilol (COREG) 12.5 MG tablet, Take 12.5 mg by mouth 2 (two) times daily. , Disp: , Rfl:  .  cholecalciferol (VITAMIN D) 1000 units tablet, Take 1,000 Units by mouth daily., Disp: , Rfl:  .  dexamethasone (DECADRON) 4 MG tablet, Take 2 tablets (8 mg total) by mouth 2 (two) times daily. Start the day before Taxotere. Take once the day after, then 2 times a day x 2d., Disp: 30 tablet, Rfl: 1 .  Fluticasone-Salmeterol (ADVAIR) 250-50 MCG/DOSE AEPB, Inhale 1 puff into the lungs 2 (two) times daily. , Disp: , Rfl:  .  gabapentin (NEURONTIN) 100 MG capsule, Take 200 mg by mouth 2 (two) times daily., Disp: , Rfl:  .  lidocaine-prilocaine (EMLA) cream, Apply to affected area once (Patient taking differently: Apply 1 application topically once. Apply to affected area once), Disp: 30 g, Rfl: 3 .  loratadine (CLARITIN) 10 MG tablet, Take 10 mg by mouth daily  as needed for allergies., Disp: , Rfl:  .  LORazepam (ATIVAN) 0.5 MG tablet, Take 1 tablet (0.5 mg total) by mouth every 6 (six) hours as needed (Nausea or vomiting)., Disp: 30 tablet, Rfl: 0 .  metFORMIN (GLUCOPHAGE) 500 MG tablet, Take 500 mg by mouth daily. , Disp: , Rfl:  .  NIFEdipine (PROCARDIA-XL/ADALAT CC) 30 MG 24 hr tablet, Take 30 mg by mouth daily., Disp: , Rfl:  .  ondansetron (ZOFRAN) 8 MG tablet, Take 1 tablet (8 mg total) by mouth 2 (two) times daily as needed (Nausea or vomiting). Begin 4 days after chemotherapy., Disp: 30 tablet, Rfl: 1 .  prochlorperazine (COMPAZINE) 10 MG tablet, Take 1 tablet (10 mg total) by mouth every 6 (six) hours as needed (Nausea or vomiting)., Disp: 30 tablet, Rfl: 1 .  solifenacin (VESICARE) 10 MG tablet, Take 10 mg by mouth daily. , Disp: , Rfl:  .  telmisartan-hydrochlorothiazide (MICARDIS HCT) 80-25 MG tablet, Take 1 tablet by mouth daily. , Disp: , Rfl:  .  acetaminophen (TYLENOL) 500 MG tablet, Take 500 mg by mouth every 6 (six) hours as needed., Disp: , Rfl:  .  potassium chloride SA (KLOR-CON) 20 MEQ tablet, Take 1 tablet (20 mEq total) by mouth daily., Disp: 30 tablet, Rfl: 1  Physical exam:  Vitals:   11/10/19 0840  BP: (!) 145/72  Pulse: 65  Resp: 16  Temp: (!) 95.7 F (35.4 C)  TempSrc: Tympanic  Weight: 209 lb 14.4 oz (95.2 kg)  Height: 5' 2"  (1.575 m)   Physical Exam Constitutional:      General: She is not in acute distress. HENT:     Head: Normocephalic and atraumatic.  Eyes:     Pupils: Pupils are equal, round, and reactive to light.  Cardiovascular:     Rate and Rhythm: Normal rate and regular rhythm.     Heart sounds: Normal heart sounds.  Pulmonary:     Effort: Pulmonary effort is normal.     Breath sounds: Normal breath sounds.  Abdominal:     General: Bowel sounds are normal.     Palpations: Abdomen is soft.  Musculoskeletal:     Cervical back: Normal range of motion.  Skin:    General: Skin is warm and dry.  Neurological:     Mental Status: She is alert and oriented to person, place, and time.      CMP Latest Ref Rng & Units 11/10/2019  Glucose 70 - 99 mg/dL 111(H)  BUN 8 - 23 mg/dL 24(H)  Creatinine 0.44 - 1.00 mg/dL 0.85  Sodium 135 - 145 mmol/L 141  Potassium 3.5 - 5.1 mmol/L 3.3(L)  Chloride 98 - 111 mmol/L 102  CO2 22 - 32 mmol/L 29  Calcium 8.9 - 10.3 mg/dL 9.6  Total Protein 6.5 - 8.1 g/dL 7.0  Total Bilirubin 0.3 - 1.2 mg/dL 0.6  Alkaline Phos 38 - 126 U/L 85  AST 15 - 41 U/L 22  ALT 0 - 44 U/L 18   CBC Latest Ref Rng & Units 11/10/2019  WBC 4.0 - 10.5 K/uL 6.3  Hemoglobin 12.0 - 15.0 g/dL 11.3(L)  Hematocrit 36.0 - 46.0 % 37.8  Platelets 150 - 400 K/uL 172    No images are attached to the encounter.  NM Cardiac Muga Rest  Result Date: 11/01/2019 CLINICAL DATA:  Breast cancer. Evaluate cardiac function in relation to chemotherapy. EXAM: NUCLEAR MEDICINE CARDIAC BLOOD POOL IMAGING (MUGA) TECHNIQUE: Cardiac multi-gated acquisition was performed at rest following intravenous injection of Tc-67mlabeled red blood cells. RADIOPHARMACEUTICALS:  19.0 mCi Tc-918mertechnetate in-vitro labeled red blood cells IV COMPARISON:  None. FINDINGS: No  focal wall motion abnormality of the left ventricle. Calculated left ventricular ejection fraction equals 58% IMPRESSION: Left ventricular ejection fraction equals58 %. Electronically Signed   By: StSuzy Bouchard.D.   On: 11/01/2019 13:12   MR BREAST BILATERAL W WO CONTRAST INC CAD  Result Date: 11/09/2019 CLINICAL DATA:  [age]ear old female with 2 sites of newly diagnosed right breast invasive mammary carcinoma. LABS:  None performed on site. EXAM: BILATERAL BREAST MRI WITH AND WITHOUT CONTRAST TECHNIQUE: Multiplanar, multisequence MR images of both breasts were obtained prior to and following the intravenous administration of 9 ml of Gadavist. Three-dimensional MR images were rendered by post-processing of the original MR data on an  independent workstation. The three-dimensional MR images were interpreted, and findings are reported in the following complete MRI report for this study. Three dimensional images were evaluated at the independent DynaCad workstation COMPARISON:  Previous exam(s). FINDINGS: Breast composition: a. Almost entirely fat. Background parenchymal enhancement: Minimal Right breast: Susceptibility artifact from post biopsy clips are demonstrated in association with 2 enhancing masses in the lateral right breast at posterior depth (series 6, image 44/112). These are consistent with the areas of biopsy proven malignancy at the 9 o'clock position 7 cm and 9 cm from the nipple. They measure 1.5 x 1.2 x 1.3 cm and 1.2 x 0.9 x 0.9 cm, respectively. No additional suspicious masses or abnormal enhancement are identified. Left breast: No suspicious mass or abnormal enhancement. Lymph nodes: No abnormal appearing lymph nodes. Ancillary findings:  None. IMPRESSION: 1. Two sites of biopsy proven malignancy in the lateral right breast. 2. No additional suspicious findings in either breast. 3. No suspicious lymphadenopathy. RECOMMENDATION: Per clinical treatment plan. BI-RADS CATEGORY  6: Known biopsy-proven malignancy. Electronically Signed   By: SeKristopher Oppenheim.D.   On: 11/09/2019 09:25   DG Chest Port 1 View  Result Date: 11/07/2019 CLINICAL DATA:  Shortness of breath EXAM: PORTABLE CHEST 1 VIEW COMPARISON:  None. FINDINGS: The heart size and mediastinal contours are within normal limits. There is interval placement of a left-sided MediPort catheter with the tip at the superior cavoatrial junction. Minimally increased bibasilar streaky  atelectasis is seen. No large airspace consolidation or pleural effusion. No pneumothorax. Bilateral shoulder arthritis is seen. IMPRESSION: Interval placement of left-sided MediPort catheter with the tip at the superior cavoatrial junction. Mild bibasilar streaky atelectasis. Electronically Signed    By: Prudencio Pair M.D.   On: 11/07/2019 09:24   DG C-Arm 1-60 Min-No Report  Result Date: 11/07/2019 Fluoroscopy was utilized by the requesting physician.  No radiographic interpretation.   US BREAST LTD UNI RIGHT INC AXILLA  Result Date: 10/14/2019 CLINICAL DATA:  Patient was called back from screening mammogram for right breast masses. EXAM: DIGITAL DIAGNOSTIC RIGHT MAMMOGRAM WITH CAD AND TOMO ULTRASOUND RIGHT BREAST COMPARISON:  Previous exam(s). ACR Breast Density Category b: There are scattered areas of fibroglandular density. FINDINGS: Additional imaging of the right breast was performed. There are 2 adjacent irregular masses in the upper-outer quadrant of the right breast measuring 1.3 and 0.8 cm. There are no malignant type microcalcifications. Mammographic images were processed with CAD. Targeted ultrasound is performed, showing 2 irregular hypoechoic masses in the right breast at 9 o'clock 9 cm from the nipple measuring 1.1 x 1.2 x 1.2 cm and 0.8 x 0.8 x 0.9 cm. The 2 masses are located 1.7 cm apart. Sonographic evaluation of the right axilla does not show any enlarged adenopathy. IMPRESSION: 2 suspicious masses in the upper-outer quadrant of the right breast. RECOMMENDATION: Ultrasound-guided core biopsies of the 2 masses in the upper-outer quadrant of the right breast is recommended. I have discussed the findings and recommendations with the patient. If applicable, a reminder letter will be sent to the patient regarding the next appointment. BI-RADS CATEGORY  5: Highly suggestive of malignancy. Electronically Signed   By: Lillia Mountain M.D.   On: 10/14/2019 14:31   MM DIAG BREAST TOMO UNI RIGHT  Result Date: 10/14/2019 CLINICAL DATA:  Patient was called back from screening mammogram for right breast masses. EXAM: DIGITAL DIAGNOSTIC RIGHT MAMMOGRAM WITH CAD AND TOMO ULTRASOUND RIGHT BREAST COMPARISON:  Previous exam(s). ACR Breast Density Category b: There are scattered areas of  fibroglandular density. FINDINGS: Additional imaging of the right breast was performed. There are 2 adjacent irregular masses in the upper-outer quadrant of the right breast measuring 1.3 and 0.8 cm. There are no malignant type microcalcifications. Mammographic images were processed with CAD. Targeted ultrasound is performed, showing 2 irregular hypoechoic masses in the right breast at 9 o'clock 9 cm from the nipple measuring 1.1 x 1.2 x 1.2 cm and 0.8 x 0.8 x 0.9 cm. The 2 masses are located 1.7 cm apart. Sonographic evaluation of the right axilla does not show any enlarged adenopathy. IMPRESSION: 2 suspicious masses in the upper-outer quadrant of the right breast. RECOMMENDATION: Ultrasound-guided core biopsies of the 2 masses in the upper-outer quadrant of the right breast is recommended. I have discussed the findings and recommendations with the patient. If applicable, a reminder letter will be sent to the patient regarding the next appointment. BI-RADS CATEGORY  5: Highly suggestive of malignancy. Electronically Signed   By: Lillia Mountain M.D.   On: 10/14/2019 14:31   MM CLIP PLACEMENT RIGHT  Result Date: 10/19/2019 CLINICAL DATA:  Status post ultrasound-guided core biopsies of the right breast. EXAM: DIAGNOSTIC RIGHT MAMMOGRAM POST ULTRASOUND BIOPSIES COMPARISON:  Previous exam(s). FINDINGS: Mammographic images were obtained following ultrasound guided biopsies of the right breast. Mammographic images show there is a coil shaped clip in the mass at 9 o'clock 7 cm from the nipple. There is a venous shaped clip 4  mm medial to the biopsied smaller mass at 9 o'clock 9 cm from the nipple. IMPRESSION: Status post ultrasound-guided core biopsy of 2 masses in the right breast. The coil shaped clip is in the mass at 9 o'clock 7 cm from the nipple. The venous shaped clip is adjacent to the mass at 9 o'clock 9 cm from the nipple. Final Assessment: Post Procedure Mammograms for Marker Placement Electronically Signed    By: Lillia Mountain M.D.   On: 10/19/2019 14:12   [place] RT BREAST BX W LOC DEV 1ST LESION IMG BX SPEC US GUIDE  Addendum Date: 10/26/2019   ADDENDUM REPORT: 10/25/2019 11:36 ADDENDUM: PATHOLOGY revealed: A. BREAST, RIGHT 9:00 7 CM FN; ULTRASOUND-GUIDED BIOPSY: - INVASIVE MAMMARY CARCINOMA, NO SPECIAL TYPE. : 9 mm in this sample. Grade 3. Ductal carcinoma in situ: Not identified. Lymphovascular invasion: Not identified. B. BREAST, RIGHT 9:00 9 CM FN; ULTRASOUND-GUIDED BIOPSY: - INVASIVE MAMMARY CARCINOMA, NO SPECIAL TYPE.: 8 mm in this sample. Grade 3. Ductal carcinoma in situ: Not identified. Lymphovascular invasion: Not identified Comment: The morphology of the two tumors is similar. ER/PR/HER2: Immunohistochemistry will be performed on block A1, with reflex to [place] for HER2 2+. The results will be reported in an addendum. Pathology results are CONCORDANT with imaging findings, per Dr. Lillia Mountain. I telephoned the patient on 10/24/2019 and discussed biopsy results and the recommendations stated below. All questions were answered. The patient denies significant pain or bleeding from the biopsy site. Biopsy site care instructions were reviewed and the patient was instructed to call [facility] with any concerns or questions related to the biopsy. Recommendation: Surgical referral. Request for surgical referral was relayed to nurse navigators at [facility] by Electa Sniff RN on 10/24/2019. Addendum by Electa Sniff RN on 10/25/2019. Electronically Signed   By: Lillia Mountain M.D.   On: 10/25/2019 11:36   Result Date: 10/26/2019 CLINICAL DATA:  Suspicious right breast masses. EXAM: ULTRASOUND GUIDED RIGHT BREAST CORE NEEDLE BIOPSIES COMPARISON:  Previous exam(s). FINDINGS: I met with the patient and we discussed the procedure of ultrasound-guided biopsy, including benefits and alternatives. We discussed the high likelihood of a successful procedure. We discussed the risks of the procedure,  including infection, bleeding, tissue injury, clip migration, and inadequate sampling. Informed written consent was given. The usual time-out protocol was performed immediately prior to the procedure. Lesion quadrant: 9 o'clock (7 cm from the nipple) Using sterile technique and 1% lidocaine and 1% lidocaine with epinephrine as local anesthetic, under direct ultrasound visualization, a 12 gauge spring-loaded device was used to perform biopsy of a mass in the 9 o'clock region of the right breast 7 cm from the nipple using a lateral to medial approach. At the conclusion of the procedure coil shaped tissue marker clip was deployed into the biopsy cavity. Follow up 2 view mammogram was performed and dictated separately. Lesion quadrant: 9 o'clock (9 cm from the nipple) Using sterile technique and 1% Lidocaine as local anesthetic, under direct ultrasound visualization, a 12 gauge spring-loaded device was used to perform biopsy of a mass in the 9 o'clock region of the right breast (9 cm from the nipple) using a lateral to medial approach. At the conclusion of the procedure venous shaped tissue marker clip was deployed into the biopsy cavity. Follow up 2 view mammogram was performed and dictated separately. IMPRESSION: Ultrasound guided biopsies of the right breast. No apparent complications. Electronically Signed: By: Lillia Mountain M.D. On: 10/19/2019 14:12   [place]  RT BREAST BX W LOC DEV EA ADD LESION IMG BX SPEC US GUIDE  Addendum Date: 10/26/2019   ADDENDUM REPORT: 10/25/2019 11:36 ADDENDUM: PATHOLOGY revealed: A. BREAST, RIGHT 9:00 7 CM FN; ULTRASOUND-GUIDED BIOPSY: - INVASIVE MAMMARY CARCINOMA, NO SPECIAL TYPE. : 9 mm in this sample. Grade 3. Ductal carcinoma in situ: Not identified. Lymphovascular invasion: Not identified. B. BREAST, RIGHT 9:00 9 CM FN; ULTRASOUND-GUIDED BIOPSY: - INVASIVE MAMMARY CARCINOMA, NO SPECIAL TYPE.: 8 mm in this sample. Grade 3. Ductal carcinoma in situ: Not identified. Lymphovascular  invasion: Not identified Comment: The morphology of the two tumors is similar. ER/PR/HER2: Immunohistochemistry will be performed on block A1, with reflex to [place] for HER2 2+. The results will be reported in an addendum. Pathology results are CONCORDANT with imaging findings, per Dr. Lillia Mountain. I telephoned the patient on 10/24/2019 and discussed biopsy results and the recommendations stated below. All questions were answered. The patient denies significant pain or bleeding from the biopsy site. Biopsy site care instructions were reviewed and the patient was instructed to call [facility] with any concerns or questions related to the biopsy. Recommendation: Surgical referral. Request for surgical referral was relayed to nurse navigators at [facility] by Electa Sniff RN on 10/24/2019. Addendum by Electa Sniff RN on 10/25/2019. Electronically Signed   By: Lillia Mountain M.D.   On: 10/25/2019 11:36   Result Date: 10/26/2019 CLINICAL DATA:  Suspicious right breast masses. EXAM: ULTRASOUND GUIDED RIGHT BREAST CORE NEEDLE BIOPSIES COMPARISON:  Previous exam(s). FINDINGS: I met with the patient and we discussed the procedure of ultrasound-guided biopsy, including benefits and alternatives. We discussed the high likelihood of a successful procedure. We discussed the risks of the procedure, including infection, bleeding, tissue injury, clip migration, and inadequate sampling. Informed written consent was given. The usual time-out protocol was performed immediately prior to the procedure. Lesion quadrant: 9 o'clock (7 cm from the nipple) Using sterile technique and 1% lidocaine and 1% lidocaine with epinephrine as local anesthetic, under direct ultrasound visualization, a 12 gauge spring-loaded device was used to perform biopsy of a mass in the 9 o'clock region of the right breast 7 cm from the nipple using a lateral to medial approach. At the conclusion of the procedure coil shaped tissue marker  clip was deployed into the biopsy cavity. Follow up 2 view mammogram was performed and dictated separately. Lesion quadrant: 9 o'clock (9 cm from the nipple) Using sterile technique and 1% Lidocaine as local anesthetic, under direct ultrasound visualization, a 12 gauge spring-loaded device was used to perform biopsy of a mass in the 9 o'clock region of the right breast (9 cm from the nipple) using a lateral to medial approach. At the conclusion of the procedure venous shaped tissue marker clip was deployed into the biopsy cavity. Follow up 2 view mammogram was performed and dictated separately. IMPRESSION: Ultrasound guided biopsies of the right breast. No apparent complications. Electronically Signed: By: Lillia Mountain M.D. On: 10/19/2019 14:12     Assessment and plan- Patient is a 63 y.o. 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  She is here for on treatment assessment prior to cycle one of neoadjuvant TCHP chemotherapy  Counts okay to proceed with cycle one of TCHP chemotherapy with on pro-Neulasta support.  She will be getting carboplatin at AUC five.  Baseline MUGA scan showed normal EF of 58%.  I discussed with the patient  and her husband and normally for HER-2 positive tumors less than 2 cm upfront surgery would have been reasonable.  However patient has two distinct massesMeasuring 1.5 cm and 1.2 cm on the MRI.  The tissue in between the two is 2 cm apart and appears normal.  No adenopathy is seen.  Given her two distinct masses I would like to offer neoadjuvant TCHP instead of adjuvant treatment.  Again discussed risks and benefits of chemotherapy including all but not limited to nausea, vomiting, low blood counts, risk of infections and hospitalization as well as hair loss.  Risk of rash and diarrhea associated with Herceptin and Perjeta and cardiotoxicity.  Treatment is being given with a curative  intent.  Patient understands and agrees to proceed as planned.  Discussed risks and benefits of Neulasta including all but not limited to arthralgias hip pain and flulike symptoms 24 to 48 hours following injection.  Patient knows to take Tylenol and Claritin for this pain.  Patient noted to have a mild hypokalemia with a potassium of 3.3 and will be giving her 20 mEq of oral potassium today.  I will see her back in 10 days time with CBC with differential, CMP to assess how she tolerated cycle one.  Cycle two will be due in 3 weeks.  Plan is to give six cycles   Visit Diagnosis 1. Invasive carcinoma of breast ([place])   2. Encounter for monoclonal antibody treatment for malignancy   3. Encounter for antineoplastic chemotherapy      Dr. Randa Evens, MD, MPH [facility] [phone] 11/10/2019 9:08 AM

## 2019-11-10 NOTE — Progress Notes (Signed)
Pt here for first treatment. A little nervous and no pain, chemo class was very informative per pt.

## 2019-11-10 NOTE — Progress Notes (Signed)
B/P 172/73. Pt just returned to infusion chair from restroom, pt also reports that she has one b/p medication that she has not taken today but will once she gets home. Pt states she monitors b/p at home. Pt denies any concerns or complaints including but not limited to headaches or blurred vision. No s/s of distress noted. Pt tolerated infusion well. Nuelasta on pro instructions given verbally and written, pt verbalizes understanding. Pt educated to call clinic with any concerns or questions or report to ER/call 911 in the event of an emergency. Pt verbalizes understanding. Dr. Janese Banks aware of b/p of 172/73, per Dr. Janese Banks okay to discharge home, pt to contact PCP if b/p remains elevated, pt aware and verbalizes understanding. Pt and VS stable at discharge.

## 2019-11-11 ENCOUNTER — Telehealth: Payer: Self-pay

## 2019-11-11 NOTE — Telephone Encounter (Signed)
Telephone call to patient for follow up after receiving first infusion yesterday.  Patient states feeling great and as "all this energy".  States slept well last night.   States took Claritin yesterday and today and will take for 2 more days.  Eating and drinking well.   Denies any nausea or vomiting.  Encouraged patient to call for any questions or concerns.  Patient thanked me for calling.

## 2019-11-16 ENCOUNTER — Other Ambulatory Visit: Payer: Self-pay | Admitting: *Deleted

## 2019-11-16 ENCOUNTER — Inpatient Hospital Stay

## 2019-11-16 ENCOUNTER — Other Ambulatory Visit: Payer: Self-pay

## 2019-11-16 ENCOUNTER — Inpatient Hospital Stay (HOSPITAL_BASED_OUTPATIENT_CLINIC_OR_DEPARTMENT_OTHER): Admitting: Oncology

## 2019-11-16 DIAGNOSIS — E876 Hypokalemia: Secondary | ICD-10-CM

## 2019-11-16 DIAGNOSIS — Z5112 Encounter for antineoplastic immunotherapy: Secondary | ICD-10-CM | POA: Diagnosis not present

## 2019-11-16 DIAGNOSIS — C50919 Malignant neoplasm of unspecified site of unspecified female breast: Secondary | ICD-10-CM

## 2019-11-16 DIAGNOSIS — R531 Weakness: Secondary | ICD-10-CM

## 2019-11-16 DIAGNOSIS — D510 Vitamin B12 deficiency anemia due to intrinsic factor deficiency: Secondary | ICD-10-CM

## 2019-11-16 DIAGNOSIS — R197 Diarrhea, unspecified: Secondary | ICD-10-CM

## 2019-11-16 DIAGNOSIS — R110 Nausea: Secondary | ICD-10-CM

## 2019-11-16 DIAGNOSIS — Z95828 Presence of other vascular implants and grafts: Secondary | ICD-10-CM

## 2019-11-16 DIAGNOSIS — R079 Chest pain, unspecified: Secondary | ICD-10-CM | POA: Diagnosis not present

## 2019-11-16 LAB — CBC WITH DIFFERENTIAL/PLATELET
Abs Immature Granulocytes: 0.02 10*3/uL (ref 0.00–0.07)
Basophils Absolute: 0 10*3/uL (ref 0.0–0.1)
Basophils Relative: 0 %
Eosinophils Absolute: 0.1 10*3/uL (ref 0.0–0.5)
Eosinophils Relative: 1 %
HCT: 38.9 % (ref 36.0–46.0)
Hemoglobin: 11.7 g/dL — ABNORMAL LOW (ref 12.0–15.0)
Immature Granulocytes: 0 %
Lymphocytes Relative: 39 %
Lymphs Abs: 2.1 10*3/uL (ref 0.7–4.0)
MCH: 24.2 pg — ABNORMAL LOW (ref 26.0–34.0)
MCHC: 30.1 g/dL (ref 30.0–36.0)
MCV: 80.4 fL (ref 80.0–100.0)
Monocytes Absolute: 0.7 10*3/uL (ref 0.1–1.0)
Monocytes Relative: 13 %
Neutro Abs: 2.6 10*3/uL (ref 1.7–7.7)
Neutrophils Relative %: 47 %
Platelets: 112 10*3/uL — ABNORMAL LOW (ref 150–400)
RBC: 4.84 MIL/uL (ref 3.87–5.11)
RDW: 15.9 % — ABNORMAL HIGH (ref 11.5–15.5)
Smear Review: NORMAL
WBC: 5.5 10*3/uL (ref 4.0–10.5)
nRBC: 0 % (ref 0.0–0.2)

## 2019-11-16 LAB — COMPREHENSIVE METABOLIC PANEL
ALT: 26 U/L (ref 0–44)
AST: 35 U/L (ref 15–41)
Albumin: 3.9 g/dL (ref 3.5–5.0)
Alkaline Phosphatase: 98 U/L (ref 38–126)
Anion gap: 10 (ref 5–15)
BUN: 22 mg/dL (ref 8–23)
CO2: 27 mmol/L (ref 22–32)
Calcium: 9.2 mg/dL (ref 8.9–10.3)
Chloride: 101 mmol/L (ref 98–111)
Creatinine, Ser: 0.94 mg/dL (ref 0.44–1.00)
GFR calc Af Amer: >60 mL/min (ref >60)
GFR calc non Af Amer: >60 mL/min (ref >60)
Glucose, Bld: 95 mg/dL (ref 70–99)
Potassium: 3.3 mmol/L — ABNORMAL LOW (ref 3.5–5.1)
Sodium: 138 mmol/L (ref 135–145)
Total Bilirubin: 1 mg/dL (ref 0.3–1.2)
Total Protein: 6.8 g/dL (ref 6.5–8.1)

## 2019-11-16 LAB — GASTROINTESTINAL PANEL BY PCR, STOOL (REPLACES STOOL CULTURE)

## 2019-11-16 LAB — C DIFFICILE QUICK SCREEN W PCR REFLEX
C Diff antigen: NEGATIVE
C Diff interpretation: NOT DETECTED
C Diff toxin: NEGATIVE

## 2019-11-16 LAB — MAGNESIUM: Magnesium: 1.9 mg/dL (ref 1.7–2.4)

## 2019-11-16 MED ORDER — DEXAMETHASONE SODIUM PHOSPHATE 10 MG/ML IJ SOLN
INTRAMUSCULAR | Status: AC
  Filled 2019-11-16: qty 1

## 2019-11-16 MED ORDER — DEXAMETHASONE SODIUM PHOSPHATE 10 MG/ML IJ SOLN
10.0000 mg | Freq: Once | INTRAMUSCULAR | Status: AC
  Administered 2019-11-16: 10 mg via INTRAVENOUS

## 2019-11-16 MED ORDER — HEPARIN SOD (PORK) LOCK FLUSH 100 UNIT/ML IV SOLN
500.0000 [IU] | Freq: Once | INTRAVENOUS | Status: AC
  Administered 2019-11-16: 12:00:00 500 [IU] via INTRAVENOUS
  Filled 2019-11-16: qty 5

## 2019-11-16 MED ORDER — SODIUM CHLORIDE 0.9 % IV SOLN
Freq: Once | INTRAVENOUS | Status: AC
  Filled 2019-11-16: qty 250

## 2019-11-16 MED ORDER — SODIUM CHLORIDE 0.9% FLUSH
10.0000 mL | INTRAVENOUS | Status: DC | PRN
  Administered 2019-11-16: 10 mL via INTRAVENOUS
  Filled 2019-11-16: qty 10

## 2019-11-16 MED ORDER — ONDANSETRON HCL 4 MG/2ML IJ SOLN
INTRAMUSCULAR | Status: AC
  Filled 2019-11-16: qty 4

## 2019-11-16 MED ORDER — ONDANSETRON HCL 4 MG/2ML IJ SOLN
8.0000 mg | Freq: Once | INTRAMUSCULAR | Status: AC
  Administered 2019-11-16: 8 mg via INTRAVENOUS

## 2019-11-16 NOTE — Patient Instructions (Signed)
I am so sorry you are not feeling well today.  While you were in clinic you received 1 L of normal saline, 8 mg of IV Zofran and 10 mg of IV Decadron. This should help rehydrate you and help with nausea and energy.  We do your labs today which looked pretty good.  The only thing that was relatively low was your potassium level at 3.3.  Normal is 3.5.  Continue your oral potassium.  Please try to collect a stool sample at your earliest convenience.  We sent you home a hat with a collection device.  Bring this back to the cancer center either today or tomorrow morning.  Once we receive the results from your stool sample we will guide you on how to treat the diarrhea.  Right now I would like for you to hold off on taking any antidiarrheal such as Imodium.  Try to stay hydrated.  Drink plenty of fluids.  We gave you some electrolyte packets which should help with keeping you hydrated.  Drink boost or Glucerna to help get nutrients in.  Call us back with any questions or concerns.  I did not send any prescriptions right now until I receive the results back from your stool sample.  We hope you have a very Merry Christmas.  Faythe Casa, NP 11/16/2019 12:23 PM

## 2019-11-16 NOTE — Progress Notes (Addendum)
Symptom Management Consult note [facility]  [phone] [phone]  Patient Care Team: Kirk Ruths, MD as PCP - General (Internal Medicine) Theodore Demark, RN as Registered Nurse (Oncology)   Name of the patient: Christina Mcclain  [id number]  Jul 18, 1956   Date of visit: 11/16/2019   Diagnosis- 1. Invasive carcinoma of breast ([place]) - Magnesium - Comprehensive metabolic panel - CBC with Differential  2. Weakness - Magnesium - Comprehensive metabolic panel - CBC with Differential  3. 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[place])  10/28/2019 Cancer Staging   Staging form: Breast, AJCC 8th Edition - Clinical stage from 10/28/2019: Stage IA (cT1c, cN0, cM0, G3, ER+, PR+, HER2+) - Signed by Sindy Guadeloupe, MD on 10/31/2019   10/31/2019 Initial Diagnosis   Invasive carcinoma of breast ([place])   11/10/2019 -  Chemotherapy   The patient had palonosetron (ALOXI) injection 0.25 mg, 0.25 mg, Intravenous,  Once, 1 of 6 cycles Administration: 0.25 mg (11/10/2019) pegfilgrastim (NEULASTA ONPRO KIT) injection 6 mg, 6 mg, Subcutaneous, Once, 1 of 6 cycles Administration: 6 mg (11/10/2019) CARBOplatin (PARAPLATIN) 630 mg in sodium chloride 0.9 % 250 mL chemo infusion, 630 mg (100 % of original dose 627.5 mg), Intravenous,  Once, 1 of 6 cycles Dose modification:   (original dose 627.5 mg, Cycle 1) Administration: 630 mg (11/10/2019) DOCEtaxel (TAXOTERE) 150 mg in sodium chloride 0.9 % 250 mL chemo infusion, 75 mg/m2 = 150 mg, Intravenous,  Once, 1 of 6 cycles Administration: 150 mg (11/10/2019) fosaprepitant (EMEND) 150 mg in sodium chloride 0.9 % 145 mL IVPB, 150 mg, Intravenous,  Once, 1 of 6 cycles Administration: 150 mg (11/10/2019) pertuzumab (PERJETA) 840 mg in sodium chloride 0.9 % 250 mL chemo infusion,  840 mg, Intravenous, Once, 1 of 6 cycles Administration: 840 mg (11/10/2019) trastuzumab-dkst (OGIVRI) 750 mg in sodium chloride 0.9 % 250 mL chemo infusion, 756 mg, Intravenous,  Once, 1 of 6 cycles Administration: 750 mg (11/10/2019)  for chemotherapy treatment.     Interval history-patient presents to symptom management today with above history for complaints of generalized weakness and diarrhea.  She recently had first cycle of neoadjuvant TCHP (11/10/2019).  Baseline MUGA showed normal EF of 58%.  Patient states that she felt great the day after treatment but on Saturday had some "loose" bowel movements (approximately 6 days) she describes them as without any distinct smell, loose with small solid components and small amounts.  She admits to greater than 6 stools daily.  She has not tried Imodium or Lomotil to reduce the amount of stool she is having.  She has tried herbal tea and Pepto-Bismol without significant improvement.  She notes a bowel movement approximately 10 to 20 minutes after eating.  She denies any other sick contacts.  She generally feels weak and tired.  Her appetite is "so-so".  She is drinking plenty of fluids.  Her husband picked her up some boost yesterday which she has not tried.  She denies any fevers or illness, bleeding or bruising, nausea, vomiting or constipation.  Has intermittent left sided chest "pounding".  States it occurs mostly in the morning and goes away by itself.  She admits to some anxiety over her diagnosis and treatment.  She is not currently taking an antianxiety medication.  ECOG FS:1 - Symptomatic but completely ambulatory  Review of systems- Review of Systems  Constitutional: Positive for malaise/fatigue. Negative for chills, fever and weight loss.  HENT: Negative for congestion, ear pain and tinnitus.   Eyes: Negative.  Negative for blurred vision and double vision.  Respiratory: Negative.  Negative for cough, sputum production and shortness of  breath.   Cardiovascular: Negative.  Negative for chest pain, palpitations and leg swelling.  Gastrointestinal: Positive for diarrhea. Negative for abdominal pain, constipation, nausea and vomiting.  Genitourinary: Negative for dysuria, frequency and urgency.  Musculoskeletal: Negative for back pain and falls.  Skin: Negative.  Negative for rash.  Neurological: Negative.  Negative for weakness and headaches.  Endo/Heme/Allergies: Negative.  Does not bruise/bleed easily.  Psychiatric/Behavioral: Negative.  Negative for depression. The patient is not nervous/anxious and does not have insomnia.      Current treatment- Received first TCHP on 10/11/19  Allergies  Allergen Reactions  . Statins     Other reaction(s): Muscle Pain Possible hives also     Past Medical History:  Diagnosis Date  . Adverse effect of anesthesia    hard to awaken  . Anemia   . Arthritis   . Asthma   . Asthma without status asthmaticus    unspecified; Take advair each night  . Bronchitis   . Constipation   . Diabetes ([place]) 2010  . Diabetes mellitus type 2, uncomplicated ([place])   . Diabetes mellitus without complication ([place])   . Encounter for blood transfusion    hysterectomy 9-10 years ago  . Heart murmur    unspecified  . Hyperlipidemia   . Hypertension 1991  . Hypothyroid   . Multiple thyroid nodules   . Neuromuscular disorder ([facility])    neuropathy  . Osteoarthritis of right hip 02/04/2017  . Palpitations   . Pernicious anemia   . Pneumonia   . Sleep apnea    Use C-PAP; can't use mouth piece. Anxiety  . Thyroid disease    nodules  . Thyroid nodule    x2  . Urinary urgency      Past Surgical History:  Procedure Laterality Date  . ABDOMINAL HYSTERECTOMY  2011   partial  . ABDOMINAL SURGERY    . CARPAL TUNNEL RELEASE Left 2008  . CARPAL TUNNEL RELEASE Right   . CHOLECYSTECTOMY  1982  . COLONOSCOPY WITH PROPOFOL N/A 10/05/2017   Procedure: COLONOSCOPY WITH PROPOFOL;  Surgeon: Manya Silvas, MD;  Location: [facility] ENDOSCOPY;  Service: Endoscopy;  Laterality: N/A;  . DIAGNOSTIC LAPAROSCOPY    . JOINT REPLACEMENT Right 02/09/2017   TOTAL HIP REPLACEMENT, DR. CARTER. [place]  . KNEE ARTHROSCOPY  1990s  . PORTACATH PLACEMENT Left 11/07/2019   Procedure: INSERTION PORT-A-CATH;  Surgeon: Herbert Pun, MD;  Location: [facility] ORS;  Service: General;  Laterality: Left;  . TOTAL HIP ARTHROPLASTY Left 06/23/2017   Procedure: TOTAL HIP ARTHROPLASTY ANTERIOR APPROACH;  Surgeon: Hessie Knows, MD;  Location: [facility] ORS;  Service: Orthopedics;  Laterality: Left;    Social History   Socioeconomic History  . Marital status: Married    Spouse name: Not on file  . Number of children: 2  . Years of education: some college  . Highest education level: Not on file  Occupational History  . Occupation: retired    Comment: [employer]  Tobacco Use  . Smoking status: Never Smoker  . Smokeless tobacco: Never Used  Substance and Sexual Activity  . Alcohol use: No  . Drug use: No  . Sexual activity: Not on file  Other Topics Concern  . Not  on file  Social History Narrative  . Not on file   Social Determinants of Health   Financial Resource Strain:   . Difficulty of Paying Living Expenses: Not on file  Food Insecurity:   . Worried About Charity fundraiser in the Last Year: Not on file  . Ran Out of Food in the Last Year: Not on file  Transportation Needs:   . Lack of Transportation (Medical): Not on file  . Lack of Transportation (Non-Medical): Not on file  Physical Activity:   . Days of Exercise per Week: Not on file  . Minutes of Exercise per Session: Not on file  Stress:   . Feeling of Stress : Not on file  Social Connections:   . Frequency of Communication with Friends and Family: Not on file  . Frequency of Social Gatherings with Friends and Family: Not on file  . Attends Religious Services: Not on file  . Active Member of Clubs or Organizations:  Not on file  . Attends Archivist Meetings: Not on file  . Marital Status: Not on file  Intimate Partner Violence:   . Fear of Current or Ex-Partner: Not on file  . Emotionally Abused: Not on file  . Physically Abused: Not on file  . Sexually Abused: Not on file    Family History  Problem Relation Age of Onset  . Heart disease Mother   . Hypertension Mother   . Arrhythmia Mother   . Heart attack Mother   . Diabetes Mother   . Hypertension Father   . Parkinson's disease Father   . Coronary artery disease Maternal Uncle   . Heart attack Maternal Uncle   . Diabetes Sister   . Diabetes Brother   . Heart disease Brother   . Breast cancer Neg Hx      Current Outpatient Medications:  .  acetaminophen (TYLENOL) 500 MG tablet, Take 500 mg by mouth every 6 (six) hours as needed., Disp: , Rfl:  .  albuterol (PROVENTIL HFA;VENTOLIN HFA) 108 (90 Base) MCG/ACT inhaler, Inhale 2 puffs into the lungs every 6 (six) hours as needed for wheezing or shortness of breath., Disp: , Rfl:  .  carvedilol (COREG) 12.5 MG tablet, Take 12.5 mg by mouth 2 (two) times daily. , Disp: , Rfl:  .  cholecalciferol (VITAMIN D) 1000 units tablet, Take 1,000 Units by mouth daily., Disp: , Rfl:  .  dexamethasone (DECADRON) 4 MG tablet, Take 2 tablets (8 mg total) by mouth 2 (two) times daily. Start the day before Taxotere. Take once the day after, then 2 times a day x 2d., Disp: 30 tablet, Rfl: 1 .  Fluticasone-Salmeterol (ADVAIR) 250-50 MCG/DOSE AEPB, Inhale 1 puff into the lungs 2 (two) times daily. , Disp: , Rfl:  .  gabapentin (NEURONTIN) 100 MG capsule, Take 200 mg by mouth 2 (two) times daily., Disp: , Rfl:  .  lidocaine-prilocaine (EMLA) cream, Apply to affected area once (Patient taking differently: Apply 1 application topically once. Apply to affected area once), Disp: 30 g, Rfl: 3 .  loratadine (CLARITIN) 10 MG tablet, Take 10 mg by mouth daily as needed for allergies., Disp: , Rfl:  .  LORazepam  (ATIVAN) 0.5 MG tablet, Take 1 tablet (0.5 mg total) by mouth every 6 (six) hours as needed (Nausea or vomiting)., Disp: 30 tablet, Rfl: 0 .  metFORMIN (GLUCOPHAGE) 500 MG tablet, Take 500 mg by mouth daily. , Disp: , Rfl:  .  NIFEdipine (PROCARDIA-XL/ADALAT CC) 30  MG 24 hr tablet, Take 30 mg by mouth daily., Disp: , Rfl:  .  ondansetron (ZOFRAN) 8 MG tablet, Take 1 tablet (8 mg total) by mouth 2 (two) times daily as needed (Nausea or vomiting). Begin 4 days after chemotherapy., Disp: 30 tablet, Rfl: 1 .  potassium chloride SA (KLOR-CON) 20 MEQ tablet, Take 1 tablet (20 mEq total) by mouth daily., Disp: 30 tablet, Rfl: 1 .  prochlorperazine (COMPAZINE) 10 MG tablet, Take 1 tablet (10 mg total) by mouth every 6 (six) hours as needed (Nausea or vomiting)., Disp: 30 tablet, Rfl: 1 .  solifenacin (VESICARE) 10 MG tablet, Take 10 mg by mouth daily. , Disp: , Rfl:  .  telmisartan-hydrochlorothiazide (MICARDIS HCT) 80-25 MG tablet, Take 1 tablet by mouth daily. , Disp: , Rfl:   Physical exam: There were no vitals filed for this visit. Physical Exam Constitutional:      Appearance: Normal appearance.  HENT:     Head: Normocephalic and atraumatic.  Eyes:     Pupils: Pupils are equal, round, and reactive to light.  Cardiovascular:     Rate and Rhythm: Normal rate and regular rhythm.     Heart sounds: Normal heart sounds. No murmur.  Pulmonary:     Effort: Pulmonary effort is normal.     Breath sounds: Normal breath sounds. No wheezing.  Abdominal:     General: Bowel sounds are normal. There is distension.     Palpations: Abdomen is soft.     Tenderness: There is no abdominal tenderness.  Musculoskeletal:        General: Normal range of motion.     Cervical back: Normal range of motion.  Skin:    General: Skin is warm and dry.     Findings: No rash.  Neurological:     Mental Status: She is alert and oriented to person, place, and time.  Psychiatric:        Judgment: Judgment normal.       CMP Latest Ref Rng & Units 11/10/2019  Glucose 70 - 99 mg/dL 111(H)  BUN 8 - 23 mg/dL 24(H)  Creatinine 0.44 - 1.00 mg/dL 0.85  Sodium 135 - 145 mmol/L 141  Potassium 3.5 - 5.1 mmol/L 3.3(L)  Chloride 98 - 111 mmol/L 102  CO2 22 - 32 mmol/L 29  Calcium 8.9 - 10.3 mg/dL 9.6  Total Protein 6.5 - 8.1 g/dL 7.0  Total Bilirubin 0.3 - 1.2 mg/dL 0.6  Alkaline Phos 38 - 126 U/L 85  AST 15 - 41 U/L 22  ALT 0 - 44 U/L 18   CBC Latest Ref Rng & Units 11/10/2019  WBC 4.0 - 10.5 K/uL 6.3  Hemoglobin 12.0 - 15.0 g/dL 11.3(L)  Hematocrit 36.0 - 46.0 % 37.8  Platelets 150 - 400 K/uL 172    No images are attached to the encounter.  NM Cardiac Muga Rest  Result Date: 11/01/2019 CLINICAL DATA:  Breast cancer. Evaluate cardiac function in relation to chemotherapy. EXAM: NUCLEAR MEDICINE CARDIAC BLOOD POOL IMAGING (MUGA) TECHNIQUE: Cardiac multi-gated acquisition was performed at rest following intravenous injection of Tc-68mlabeled red blood cells. RADIOPHARMACEUTICALS:  19.0 mCi Tc-946mertechnetate in-vitro labeled red blood cells IV COMPARISON:  None. FINDINGS: No  focal wall motion abnormality of the left ventricle. Calculated left ventricular ejection fraction equals 58% IMPRESSION: Left ventricular ejection fraction equals58 %. Electronically Signed   By: StSuzy Bouchard.D.   On: 11/01/2019 13:12   MR BREAST BILATERAL W WO CONTRAST INC CAD  Result Date: 11/09/2019 CLINICAL DATA:  63 year old female with 2 sites of newly diagnosed right breast invasive mammary carcinoma. LABS:  None performed on site. EXAM: BILATERAL BREAST MRI WITH AND WITHOUT CONTRAST TECHNIQUE: Multiplanar, multisequence MR images of both breasts were obtained prior to and following the intravenous administration of 9 ml of Gadavist. Three-dimensional MR images were rendered by post-processing of the original MR data on an independent workstation. The three-dimensional MR images were interpreted, and findings are reported  in the following complete MRI report for this study. Three dimensional images were evaluated at the independent DynaCad workstation COMPARISON:  Previous exam(s). FINDINGS: Breast composition: a. Almost entirely fat. Background parenchymal enhancement: Minimal Right breast: Susceptibility artifact from post biopsy clips are demonstrated in association with 2 enhancing masses in the lateral right breast at posterior depth (series 6, image 44/112). These are consistent with the areas of biopsy proven malignancy at the 9 o'clock position 7 cm and 9 cm from the nipple. They measure 1.5 x 1.2 x 1.3 cm and 1.2 x 0.9 x 0.9 cm, respectively. No additional suspicious masses or abnormal enhancement are identified. Left breast: No suspicious mass or abnormal enhancement. Lymph nodes: No abnormal appearing lymph nodes. Ancillary findings:  None. IMPRESSION: 1. Two sites of biopsy proven malignancy in the lateral right breast. 2. No additional suspicious findings in either breast. 3. No suspicious lymphadenopathy. RECOMMENDATION: Per clinical treatment plan. BI-RADS CATEGORY  6: Known biopsy-proven malignancy. Electronically Signed   By: Kristopher Oppenheim M.D.   On: 11/09/2019 09:25   DG Chest Port 1 View  Result Date: 11/07/2019 CLINICAL DATA:  Shortness of breath EXAM: PORTABLE CHEST 1 VIEW COMPARISON:  None. FINDINGS: The heart size and mediastinal contours are within normal limits. There is interval placement of a left-sided MediPort catheter with the tip at the superior cavoatrial junction. Minimally increased bibasilar streaky atelectasis is seen. No large airspace consolidation or pleural effusion. No pneumothorax. Bilateral shoulder arthritis is seen. IMPRESSION: Interval placement of left-sided MediPort catheter with the tip at the superior cavoatrial junction. Mild bibasilar streaky atelectasis. Electronically Signed   By: Prudencio Pair M.D.   On: 11/07/2019 09:24   DG C-Arm 1-60 Min-No Report  Result Date:  11/07/2019 Fluoroscopy was utilized by the requesting physician.  No radiographic interpretation.   MM CLIP PLACEMENT RIGHT  Result Date: 10/19/2019 CLINICAL DATA:  Status post ultrasound-guided core biopsies of the right breast. EXAM: DIAGNOSTIC RIGHT MAMMOGRAM POST ULTRASOUND BIOPSIES COMPARISON:  Previous exam(s). FINDINGS: Mammographic images were obtained following ultrasound guided biopsies of the right breast. Mammographic images show there is a coil shaped clip in the mass at 9 o'clock 7 cm from the nipple. There is a venous shaped clip 4 mm medial to the biopsied smaller mass at 9 o'clock 9 cm from the nipple. IMPRESSION: Status post ultrasound-guided core biopsy of 2 masses in the right breast. The coil shaped clip is in the mass at 9 o'clock 7 cm from the nipple. The venous shaped clip is adjacent to the mass at 9 o'clock 9 cm from the nipple. Final Assessment: Post Procedure Mammograms for Marker Placement Electronically Signed   By: Lillia Mountain M.D.   On: 10/19/2019 14:12   [place] RT BREAST BX W LOC DEV 1ST LESION IMG BX SPEC US GUIDE  Addendum Date: 10/26/2019   ADDENDUM REPORT: 10/25/2019 11:36 ADDENDUM: PATHOLOGY revealed: A. BREAST, RIGHT 9:00 7 CM FN; ULTRASOUND-GUIDED BIOPSY: - INVASIVE MAMMARY CARCINOMA, NO SPECIAL TYPE. : 9 mm in this sample. Grade  3. Ductal carcinoma in situ: Not identified. Lymphovascular invasion: Not identified. B. BREAST, RIGHT 9:00 9 CM FN; ULTRASOUND-GUIDED BIOPSY: - INVASIVE MAMMARY CARCINOMA, NO SPECIAL TYPE.: 8 mm in this sample. Grade 3. Ductal carcinoma in situ: Not identified. Lymphovascular invasion: Not identified Comment: The morphology of the two tumors is similar. ER/PR/HER2: Immunohistochemistry will be performed on block A1, with reflex to [place] for HER2 2+. The results will be reported in an addendum. Pathology results are CONCORDANT with imaging findings, per Dr. Lillia Mountain. I telephoned the patient on 10/24/2019 and discussed biopsy results and the  recommendations stated below. All questions were answered. The patient denies significant pain or bleeding from the biopsy site. Biopsy site care instructions were reviewed and the patient was instructed to call [facility] with any concerns or questions related to the biopsy. Recommendation: Surgical referral. Request for surgical referral was relayed to nurse navigators at [facility] by Electa Sniff RN on 10/24/2019. Addendum by Electa Sniff RN on 10/25/2019. Electronically Signed   By: Lillia Mountain M.D.   On: 10/25/2019 11:36   Result Date: 10/26/2019 CLINICAL DATA:  Suspicious right breast masses. EXAM: ULTRASOUND GUIDED RIGHT BREAST CORE NEEDLE BIOPSIES COMPARISON:  Previous exam(s). FINDINGS: I met with the patient and we discussed the procedure of ultrasound-guided biopsy, including benefits and alternatives. We discussed the high likelihood of a successful procedure. We discussed the risks of the procedure, including infection, bleeding, tissue injury, clip migration, and inadequate sampling. Informed written consent was given. The usual time-out protocol was performed immediately prior to the procedure. Lesion quadrant: 9 o'clock (7 cm from the nipple) Using sterile technique and 1% lidocaine and 1% lidocaine with epinephrine as local anesthetic, under direct ultrasound visualization, a 12 gauge spring-loaded device was used to perform biopsy of a mass in the 9 o'clock region of the right breast 7 cm from the nipple using a lateral to medial approach. At the conclusion of the procedure coil shaped tissue marker clip was deployed into the biopsy cavity. Follow up 2 view mammogram was performed and dictated separately. Lesion quadrant: 9 o'clock (9 cm from the nipple) Using sterile technique and 1% Lidocaine as local anesthetic, under direct ultrasound visualization, a 12 gauge spring-loaded device was used to perform biopsy of a mass in the 9 o'clock region of the right breast (9  cm from the nipple) using a lateral to medial approach. At the conclusion of the procedure venous shaped tissue marker clip was deployed into the biopsy cavity. Follow up 2 view mammogram was performed and dictated separately. IMPRESSION: Ultrasound guided biopsies of the right breast. No apparent complications. Electronically Signed: By: Lillia Mountain M.D. On: 10/19/2019 14:12   [place] RT BREAST BX W LOC DEV EA ADD LESION IMG BX SPEC US GUIDE  Addendum Date: 10/26/2019   ADDENDUM REPORT: 10/25/2019 11:36 ADDENDUM: PATHOLOGY revealed: A. BREAST, RIGHT 9:00 7 CM FN; ULTRASOUND-GUIDED BIOPSY: - INVASIVE MAMMARY CARCINOMA, NO SPECIAL TYPE. : 9 mm in this sample. Grade 3. Ductal carcinoma in situ: Not identified. Lymphovascular invasion: Not identified. B. BREAST, RIGHT 9:00 9 CM FN; ULTRASOUND-GUIDED BIOPSY: - INVASIVE MAMMARY CARCINOMA, NO SPECIAL TYPE.: 8 mm in this sample. Grade 3. Ductal carcinoma in situ: Not identified. Lymphovascular invasion: Not identified Comment: The morphology of the two tumors is similar. ER/PR/HER2: Immunohistochemistry will be performed on block A1, with reflex to [place] for HER2 2+. The results will be reported in an addendum. Pathology results are CONCORDANT with imaging findings, per Dr.  Lillia Mountain. I telephoned the patient on 10/24/2019 and discussed biopsy results and the recommendations stated below. All questions were answered. The patient denies significant pain or bleeding from the biopsy site. Biopsy site care instructions were reviewed and the patient was instructed to call [facility] with any concerns or questions related to the biopsy. Recommendation: Surgical referral. Request for surgical referral was relayed to nurse navigators at [facility] by Electa Sniff RN on 10/24/2019. Addendum by Electa Sniff RN on 10/25/2019. Electronically Signed   By: Lillia Mountain M.D.   On: 10/25/2019 11:36   Result Date: 10/26/2019 CLINICAL DATA:  Suspicious right  breast masses. EXAM: ULTRASOUND GUIDED RIGHT BREAST CORE NEEDLE BIOPSIES COMPARISON:  Previous exam(s). FINDINGS: I met with the patient and we discussed the procedure of ultrasound-guided biopsy, including benefits and alternatives. We discussed the high likelihood of a successful procedure. We discussed the risks of the procedure, including infection, bleeding, tissue injury, clip migration, and inadequate sampling. Informed written consent was given. The usual time-out protocol was performed immediately prior to the procedure. Lesion quadrant: 9 o'clock (7 cm from the nipple) Using sterile technique and 1% lidocaine and 1% lidocaine with epinephrine as local anesthetic, under direct ultrasound visualization, a 12 gauge spring-loaded device was used to perform biopsy of a mass in the 9 o'clock region of the right breast 7 cm from the nipple using a lateral to medial approach. At the conclusion of the procedure coil shaped tissue marker clip was deployed into the biopsy cavity. Follow up 2 view mammogram was performed and dictated separately. Lesion quadrant: 9 o'clock (9 cm from the nipple) Using sterile technique and 1% Lidocaine as local anesthetic, under direct ultrasound visualization, a 12 gauge spring-loaded device was used to perform biopsy of a mass in the 9 o'clock region of the right breast (9 cm from the nipple) using a lateral to medial approach. At the conclusion of the procedure venous shaped tissue marker clip was deployed into the biopsy cavity. Follow up 2 view mammogram was performed and dictated separately. IMPRESSION: Ultrasound guided biopsies of the right breast. No apparent complications. Electronically Signed: By: Lillia Mountain M.D. On: 10/19/2019 14:12     Assessment and plan- Patient is a 63 y.o. female who presents to symptom management for complaints of generalized weakness and diarrhea.  She received first cycle of TCHP on 11/10/2019.  Breast cancer: Recent diagnosis.  Began first  cycle of neoadjuvant TCHP last week.  Plan is for neoadjuvant TCHP versus adjuvant due to size of the masses followed by surgery and radiation.  She will receive treatment every 3 weeks for 6 cycles.  She will then continue Herceptin and Perjeta for 1 year.  She will likely need hormone therapy given her ER positive disease.  She is scheduled to return to clinic on 11/21/2019 for labs and to assess tolerance of cycle 1.   Diarrhea/abdomial cramping: Likely treatment induced (Perjeta (46-67%) Taxotere (23-43%) but will rule out infection/bacteria with C. difficile and GI panel.  Patient was unable to produce in clinic so we will bring back sample.  No Imodium or Lomotil until results of stool sample.  Chest palpitations: EKG Negative.  Pretreatment echo EF 58%.  Likely dehydration.  Encouraged fluids.  Provided electrolyte Ensure packets and Glucerna.  Plan: Stat labs. (K 3.3)  Vital signs.  Orthostatic negative. Give IV fluids Give 10 mg Decadron Give 8 mg Zofran C-diff/GI panel- Stat  Patient felt improved prior to discharge from  clinic.  Asked patient to call if symptoms worsened.  We will touch base in the morning and will call with results from stool sample.  Disposition RTC as scheduled next week to see Dr Janese Banks with labs.   Addendum: Spoke to patient on 09/17/2019 at 9:30 AM to see how she was feeling.  Patient was significantly improved from yesterday.  We discussed her results from her C. difficile and GI panel.  Both were negative.  She is okay to start taking Imodium for loose stools.  She verbalized her understanding.   Visit Diagnosis 1. Invasive carcinoma of breast ([place])   2. Weakness   3. Hypokalemia     Patient expressed understanding and was in agreement with this plan. She also understands that She can call clinic at any time with any questions, concerns, or complaints.   Greater than 50% was spent in counseling and coordination of care with this patient including but  not limited to discussion of the relevant topics above (See A&P) including, but not limited to diagnosis and management of acute and chronic medical conditions.   Thank you for allowing me to participate in the care of this very pleasant patient.    Jacquelin Hawking, NP [place] at [facility] Cell - [phone] Pager- [phone] 11/16/2019 10:51 AM

## 2019-11-21 ENCOUNTER — Inpatient Hospital Stay

## 2019-11-21 ENCOUNTER — Other Ambulatory Visit: Payer: Self-pay

## 2019-11-21 ENCOUNTER — Encounter: Payer: Self-pay | Admitting: Oncology

## 2019-11-21 ENCOUNTER — Inpatient Hospital Stay (HOSPITAL_BASED_OUTPATIENT_CLINIC_OR_DEPARTMENT_OTHER): Admitting: Oncology

## 2019-11-21 VITALS — BP 102/60 | HR 66 | Temp 95.7°F | Resp 16 | Wt 201.8 lb

## 2019-11-21 DIAGNOSIS — R197 Diarrhea, unspecified: Secondary | ICD-10-CM | POA: Diagnosis not present

## 2019-11-21 DIAGNOSIS — C50919 Malignant neoplasm of unspecified site of unspecified female breast: Secondary | ICD-10-CM

## 2019-11-21 DIAGNOSIS — Z5112 Encounter for antineoplastic immunotherapy: Secondary | ICD-10-CM | POA: Diagnosis not present

## 2019-11-21 LAB — COMPREHENSIVE METABOLIC PANEL
ALT: 20 U/L (ref 0–44)
AST: 22 U/L (ref 15–41)
Albumin: 3.9 g/dL (ref 3.5–5.0)
Alkaline Phosphatase: 112 U/L (ref 38–126)
Anion gap: 6 (ref 5–15)
BUN: 21 mg/dL (ref 8–23)
CO2: 29 mmol/L (ref 22–32)
Calcium: 9 mg/dL (ref 8.9–10.3)
Chloride: 104 mmol/L (ref 98–111)
Creatinine, Ser: 1.07 mg/dL — ABNORMAL HIGH (ref 0.44–1.00)
GFR calc Af Amer: >60 mL/min (ref >60)
GFR calc non Af Amer: 55 mL/min — ABNORMAL LOW (ref >60)
Glucose, Bld: 110 mg/dL — ABNORMAL HIGH (ref 70–99)
Potassium: 3.9 mmol/L (ref 3.5–5.1)
Sodium: 139 mmol/L (ref 135–145)
Total Bilirubin: 0.6 mg/dL (ref 0.3–1.2)
Total Protein: 7 g/dL (ref 6.5–8.1)

## 2019-11-21 LAB — CBC WITH DIFFERENTIAL/PLATELET
Abs Immature Granulocytes: 0.51 10*3/uL — ABNORMAL HIGH (ref 0.00–0.07)
Basophils Absolute: 0.1 10*3/uL (ref 0.0–0.1)
Basophils Relative: 1 %
Eosinophils Absolute: 0 10*3/uL (ref 0.0–0.5)
Eosinophils Relative: 0 %
HCT: 36.8 % (ref 36.0–46.0)
Hemoglobin: 10.9 g/dL — ABNORMAL LOW (ref 12.0–15.0)
Immature Granulocytes: 4 %
Lymphocytes Relative: 21 %
Lymphs Abs: 2.8 10*3/uL (ref 0.7–4.0)
MCH: 24 pg — ABNORMAL LOW (ref 26.0–34.0)
MCHC: 29.6 g/dL — ABNORMAL LOW (ref 30.0–36.0)
MCV: 81.1 fL (ref 80.0–100.0)
Monocytes Absolute: 1.2 10*3/uL — ABNORMAL HIGH (ref 0.1–1.0)
Monocytes Relative: 9 %
Neutro Abs: 8.9 10*3/uL — ABNORMAL HIGH (ref 1.7–7.7)
Neutrophils Relative %: 65 %
Platelets: 149 10*3/uL — ABNORMAL LOW (ref 150–400)
RBC: 4.54 MIL/uL (ref 3.87–5.11)
RDW: 16.6 % — ABNORMAL HIGH (ref 11.5–15.5)
WBC: 13.5 10*3/uL — ABNORMAL HIGH (ref 4.0–10.5)
nRBC: 0.1 % (ref 0.0–0.2)

## 2019-11-21 MED ORDER — HEPARIN SOD (PORK) LOCK FLUSH 100 UNIT/ML IV SOLN
500.0000 [IU] | Freq: Once | INTRAVENOUS | Status: AC
  Administered 2019-11-21: 12:00:00 500 [IU] via INTRAVENOUS
  Filled 2019-11-21: qty 5

## 2019-11-21 MED ORDER — SODIUM CHLORIDE 0.9% FLUSH
10.0000 mL | Freq: Once | INTRAVENOUS | Status: AC
  Administered 2019-11-21: 11:00:00 10 mL via INTRAVENOUS
  Filled 2019-11-21: qty 10

## 2019-11-21 NOTE — Progress Notes (Signed)
Pt had diarrhea last night into the morning and took imodium and none since. She did get IVF last week and had same diarrhea. She felt the fluids made her feel better.

## 2019-11-22 ENCOUNTER — Encounter: Payer: Self-pay | Admitting: Oncology

## 2019-11-22 NOTE — Progress Notes (Signed)
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 It showed 2 suspicious lesions in the right breast. Diagnostic mammogram and ultrasound confirmed 2 masses in the 9 o'clock position of the right breast measuring 1.1 x 1.2 x 1.2 cm and the other one measuring 0.8 x 0.8 x 0.9 cm. These 2 masses were 1.7 cm apart. Sonographic evaluation of the right axilla did not show any enlarged adenopathy. 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  Baseline MUGA scan showed EF of 58%.  MRI showed two distinct masses in the right breast 1.5 cm and 1.2 cm 2 cm apart no additional suspicious masses or abnormal enhancement identified.  No suspicious adenopathy   Interval history-patient was seen at symptom management clinic last week for significant diarrhea.  C. difficile and GI panel was negative and she was given Imodium.  Patient reports that her diarrhea waxes and wanes.  Over the last 1 week she has had mostly good days but sometimes she does have small episodes of watery diarrhea.  Denies any other complaints at this time  ECOG PS- 1 Pain scale- 0   Review of systems- Review of Systems  Constitutional: Negative for chills,  fever, malaise/fatigue and weight loss.  HENT: Negative for congestion, ear discharge and nosebleeds.   Eyes: Negative for blurred vision.  Respiratory: Negative for cough, hemoptysis, sputum production, shortness of breath and wheezing.   Cardiovascular: Negative for chest pain, palpitations, orthopnea and claudication.  Gastrointestinal: Positive for diarrhea. Negative for abdominal pain, blood in stool, constipation, heartburn, melena, nausea and vomiting.  Genitourinary: Negative for dysuria, flank pain, frequency, hematuria and urgency.  Musculoskeletal: Negative for back pain, joint pain and myalgias.  Skin: Negative for rash.  Neurological: Negative for dizziness, tingling, focal weakness, seizures, weakness and headaches.  Endo/Heme/Allergies: Does not bruise/bleed easily.  Psychiatric/Behavioral: Negative for depression and suicidal ideas. The patient does not have insomnia.       Allergies  Allergen Reactions  . Statins     Other reaction(s): Muscle Pain Possible hives also     Past Medical History:  Diagnosis Date  . Adverse effect of anesthesia    hard to awaken  . Anemia   . Arthritis   . Asthma   . Asthma without status asthmaticus    unspecified; Take advair each night  . Breast cancer ([place])   . Bronchitis   . Constipation   . Diabetes ([place] II) 2010  . Diabetes mellitus type 2, uncomplicated ([place])   . Diabetes mellitus without complication ([place])   . Encounter for blood transfusion    hysterectomy 9-10 years ago  . Heart murmur    unspecified  . Hyperlipidemia   . Hypertension 1991  . Hypothyroid   . Multiple thyroid nodules   . Neuromuscular disorder ([place])  neuropathy  . Osteoarthritis of right hip 02/04/2017  . Palpitations   . Pernicious anemia   . Pneumonia   . Sleep apnea    Use C-PAP; can't use mouth piece. Anxiety  . Thyroid disease    nodules  . Thyroid nodule    x2  . Urinary urgency      Past Surgical History:  Procedure Laterality  Date  . ABDOMINAL HYSTERECTOMY  2011   partial  . ABDOMINAL SURGERY    . CARPAL TUNNEL RELEASE Left 2008  . CARPAL TUNNEL RELEASE Right   . CHOLECYSTECTOMY  1982  . COLONOSCOPY WITH PROPOFOL N/A 10/05/2017   Procedure: COLONOSCOPY WITH PROPOFOL;  Surgeon: Manya Silvas, MD;  Location[facility] ENDOSCOPY;  Service: Endoscopy;  Laterality: N/A;  . DIAGNOSTIC LAPAROSCOPY    . JOINT REPLACEMENT Right 02/09/2017   TOTAL HIP REPLACEMENT, DR. CARTER. [place]  . KNEE ARTHROSCOPY  1990s  . PORTACATH PLACEMENT Left 11/07/2019   Procedure: INSERTION PORT-A-CATH;  Surgeon: Herbert Pun, MD;  Location: [facility] ORS;  Service: General;  Laterality: Left;  . TOTAL HIP ARTHROPLASTY Left 06/23/2017   Procedure: TOTAL HIP ARTHROPLASTY ANTERIOR APPROACH;  Surgeon: Hessie Knows, MD;  Location: [facility] ORS;  Service: Orthopedics;  Laterality: Left;    Social History   Socioeconomic History  . Marital status: Married    Spouse name: Not on file  . Number of children: 2  . Years of education: some college  . Highest education level: Not on file  Occupational History  . Occupation: retired    Comment: [employer]  Tobacco Use  . Smoking status: Never Smoker  . Smokeless tobacco: Never Used  Substance and Sexual Activity  . Alcohol use: No  . Drug use: No  . Sexual activity: Not on file  Other Topics Concern  . Not on file  Social History Narrative  . Not on file   Social Determinants of Health   Financial Resource Strain:   . Difficulty of Paying Living Expenses: Not on file  Food Insecurity:   . Worried About Charity fundraiser in the Last Year: Not on file  . Ran Out of Food in the Last Year: Not on file  Transportation Needs:   . Lack of Transportation (Medical): Not on file  . Lack of Transportation (Non-Medical): Not on file  Physical Activity:   . Days of Exercise per Week: Not on file  . Minutes of Exercise per Session: Not on file  Stress:   . Feeling of  Stress : Not on file  Social Connections:   . Frequency of Communication with Friends and Family: Not on file  . Frequency of Social Gatherings with Friends and Family: Not on file  . Attends Religious Services: Not on file  . Active Member of Clubs or Organizations: Not on file  . Attends Archivist Meetings: Not on file  . Marital Status: Not on file  Intimate Partner Violence:   . Fear of Current or Ex-Partner: Not on file  . Emotionally Abused: Not on file  . Physically Abused: Not on file  . Sexually Abused: Not on file    Family History  Problem Relation Age of Onset  . Heart disease Mother   . Hypertension Mother   . Arrhythmia Mother   . Heart attack Mother   . Diabetes Mother   . Hypertension Father   . Parkinson's disease Father   . Coronary artery disease Maternal Uncle   . Heart  attack Maternal Uncle   . Diabetes Sister   . Diabetes Brother   . Heart disease Brother   . Breast cancer Neg Hx      Current Outpatient Medications:  .  acetaminophen (TYLENOL) 500 MG tablet, Take 500 mg by mouth every 6 (six) hours as needed., Disp: , Rfl:  .  albuterol (PROVENTIL HFA;VENTOLIN HFA) 108 (90 Base) MCG/ACT inhaler, Inhale 2 puffs into the lungs every 6 (six) hours as needed for wheezing or shortness of breath., Disp: , Rfl:  .  carvedilol (COREG) 12.5 MG tablet, Take 12.5 mg by mouth 2 (two) times daily. , Disp: , Rfl:  .  cholecalciferol (VITAMIN D) 1000 units tablet, Take 1,000 Units by mouth daily., Disp: , Rfl:  .  dexamethasone (DECADRON) 4 MG tablet, Take 2 tablets (8 mg total) by mouth 2 (two) times daily. Start the day before Taxotere. Take once the day after, then 2 times a day x 2d., Disp: 30 tablet, Rfl: 1 .  Fluticasone-Salmeterol (ADVAIR) 250-50 MCG/DOSE AEPB, Inhale 1 puff into the lungs 2 (two) times daily. , Disp: , Rfl:  .  gabapentin (NEURONTIN) 100 MG capsule, Take 200 mg by mouth 2 (two) times daily., Disp: , Rfl:  .  lidocaine-prilocaine  (EMLA) cream, Apply to affected area once (Patient taking differently: Apply 1 application topically once. Apply to affected area once), Disp: 30 g, Rfl: 3 .  loratadine (CLARITIN) 10 MG tablet, Take 10 mg by mouth daily as needed for allergies., Disp: , Rfl:  .  LORazepam (ATIVAN) 0.5 MG tablet, Take 1 tablet (0.5 mg total) by mouth every 6 (six) hours as needed (Nausea or vomiting)., Disp: 30 tablet, Rfl: 0 .  metFORMIN (GLUCOPHAGE) 500 MG tablet, Take 500 mg by mouth daily. , Disp: , Rfl:  .  ondansetron (ZOFRAN) 8 MG tablet, Take 1 tablet (8 mg total) by mouth 2 (two) times daily as needed (Nausea or vomiting). Begin 4 days after chemotherapy., Disp: 30 tablet, Rfl: 1 .  potassium chloride SA (KLOR-CON) 20 MEQ tablet, Take 1 tablet (20 mEq total) by mouth daily., Disp: 30 tablet, Rfl: 1 .  prochlorperazine (COMPAZINE) 10 MG tablet, Take 1 tablet (10 mg total) by mouth every 6 (six) hours as needed (Nausea or vomiting)., Disp: 30 tablet, Rfl: 1 .  telmisartan-hydrochlorothiazide (MICARDIS HCT) 80-25 MG tablet, Take 1 tablet by mouth daily. , Disp: , Rfl:  .  solifenacin (VESICARE) 10 MG tablet, Take 10 mg by mouth daily. , Disp: , Rfl:   Physical exam:  Vitals:   11/21/19 1110  BP: 102/60  Pulse: 66  Resp: 16  Temp: (!) 95.7 F (35.4 C)  TempSrc: Tympanic  Weight: 201 lb 12.8 oz (91.5 kg)   Physical Exam Constitutional:      General: She is not in acute distress. HENT:     Head: Normocephalic and atraumatic.  Eyes:     Pupils: Pupils are equal, round, and reactive to light.  Cardiovascular:     Rate and Rhythm: Normal rate and regular rhythm.     Heart sounds: Normal heart sounds.  Pulmonary:     Effort: Pulmonary effort is normal.     Breath sounds: Normal breath sounds.  Abdominal:     General: Bowel sounds are normal.     Palpations: Abdomen is soft.  Musculoskeletal:     Cervical back: Normal range of motion.  Skin:    General: Skin is warm and dry.  Neurological:  Mental Status: She is alert and oriented to person, place, and time.      CMP Latest Ref Rng & Units 11/21/2019  Glucose 70 - 99 mg/dL 110(H)  BUN 8 - 23 mg/dL 21  Creatinine 0.44 - 1.00 mg/dL 1.07(H)  Sodium 135 - 145 mmol/L 139  Potassium 3.5 - 5.1 mmol/L 3.9  Chloride 98 - 111 mmol/L 104  CO2 22 - 32 mmol/L 29  Calcium 8.9 - 10.3 mg/dL 9.0  Total Protein 6.5 - 8.1 g/dL 7.0  Total Bilirubin 0.3 - 1.2 mg/dL 0.6  Alkaline Phos 38 - 126 U/L 112  AST 15 - 41 U/L 22  ALT 0 - 44 U/L 20   CBC Latest Ref Rng & Units 11/21/2019  WBC 4.0 - 10.5 K/uL 13.5(H)  Hemoglobin 12.0 - 15.0 g/dL 10.9(L)  Hematocrit 36.0 - 46.0 % 36.8  Platelets 150 - 400 K/uL 149(L)    No images are attached to the encounter.  NM Cardiac Muga Rest  Result Date: 11/01/2019 CLINICAL DATA:  Breast cancer. Evaluate cardiac function in relation to chemotherapy. EXAM: NUCLEAR MEDICINE CARDIAC BLOOD POOL IMAGING (MUGA) TECHNIQUE: Cardiac multi-gated acquisition was performed at rest following intravenous injection of Tc-74mlabeled red blood cells. RADIOPHARMACEUTICALS:  19.0 mCi Tc-953mertechnetate in-vitro labeled red blood cells IV COMPARISON:  None. FINDINGS: No  focal wall motion abnormality of the left ventricle. Calculated left ventricular ejection fraction equals 58% IMPRESSION: Left ventricular ejection fraction equals58 %. Electronically Signed   By: StSuzy Bouchard.D.   On: 11/01/2019 13:12   MR BREAST BILATERAL W WO CONTRAST INC CAD  Result Date: 11/09/2019 CLINICAL DATA:  [age]ear old female with 2 sites of newly diagnosed right breast invasive mammary carcinoma. LABS:  None performed on site. EXAM: BILATERAL BREAST MRI WITH AND WITHOUT CONTRAST TECHNIQUE: Multiplanar, multisequence MR images of both breasts were obtained prior to and following the intravenous administration of 9 ml of Gadavist. Three-dimensional MR images were rendered by post-processing of the original MR data on an independent  workstation. The three-dimensional MR images were interpreted, and findings are reported in the following complete MRI report for this study. Three dimensional images were evaluated at the independent DynaCad workstation COMPARISON:  Previous exam(s). FINDINGS: Breast composition: a. Almost entirely fat. Background parenchymal enhancement: Minimal Right breast: Susceptibility artifact from post biopsy clips are demonstrated in association with 2 enhancing masses in the lateral right breast at posterior depth (series 6, image 44/112). These are consistent with the areas of biopsy proven malignancy at the 9 o'clock position 7 cm and 9 cm from the nipple. They measure 1.5 x 1.2 x 1.3 cm and 1.2 x 0.9 x 0.9 cm, respectively. No additional suspicious masses or abnormal enhancement are identified. Left breast: No suspicious mass or abnormal enhancement. Lymph nodes: No abnormal appearing lymph nodes. Ancillary findings:  None. IMPRESSION: 1. Two sites of biopsy proven malignancy in the lateral right breast. 2. No additional suspicious findings in either breast. 3. No suspicious lymphadenopathy. RECOMMENDATION: Per clinical treatment plan. BI-RADS CATEGORY  6: Known biopsy-proven malignancy. Electronically Signed   By: SeKristopher Oppenheim.D.   On: 11/09/2019 09:25   DG Chest Port 1 View  Result Date: 11/07/2019 CLINICAL DATA:  Shortness of breath EXAM: PORTABLE CHEST 1 VIEW COMPARISON:  None. FINDINGS: The heart size and mediastinal contours are within normal limits. There is interval placement of a left-sided MediPort catheter with the tip at the superior cavoatrial junction. Minimally increased bibasilar streaky atelectasis is seen. No large  airspace consolidation or pleural effusion. No pneumothorax. Bilateral shoulder arthritis is seen. IMPRESSION: Interval placement of left-sided MediPort catheter with the tip at the superior cavoatrial junction. Mild bibasilar streaky atelectasis. Electronically Signed   By: Prudencio Pair M.D.   On: 11/07/2019 09:24   DG C-Arm 1-60 Min-No Report  Result Date: 11/07/2019 Fluoroscopy was utilized by the requesting physician.  No radiographic interpretation.     Assessment and plan- Patient is a 63 y.o. 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    She is here for toxicity check after 1 cycle of neoadjuvant TCHP chemotherapy  Patient did have grade 2 diarrhea after first cycle of TCHP chemotherapy.  Discussed with the patient that she needs to continue taking Imodium for her diarrhea 2 mg after each watery bowel movement to a maximum of 16 mg/day.  If Imodium does not control her diarrhea I will consider switching her to Lomotil or adding budesonide to her regimen.  I would like to continue with Herceptin and Perjeta which is likely the main cause of her diarrhea for the next 5 cycles to maximize the chances of achieving a PCR with neoadjuvant treatment.  I will see her back next week with CBC with differential, CMP for cycle 2 of TCHP chemotherapy as planned.  She does not require any IV fluids at this time   Visit Diagnosis 1. Diarrhea, unspecified type      Dr. Randa Evens, MD, MPH [facility] at [facility] [phone] 11/22/2019 3:21 PM

## 2019-11-25 DIAGNOSIS — C50919 Malignant neoplasm of unspecified site of unspecified female breast: Secondary | ICD-10-CM

## 2019-11-25 DIAGNOSIS — Z923 Personal history of irradiation: Secondary | ICD-10-CM

## 2019-11-25 HISTORY — DX: Personal history of irradiation: Z92.3

## 2019-11-25 HISTORY — DX: Malignant neoplasm of unspecified site of unspecified female breast: C50.919

## 2019-11-28 ENCOUNTER — Ambulatory Visit

## 2019-11-28 ENCOUNTER — Other Ambulatory Visit

## 2019-11-28 ENCOUNTER — Ambulatory Visit: Admitting: Oncology

## 2019-11-30 NOTE — Progress Notes (Signed)
Called patient no answer left message  

## 2019-12-01 ENCOUNTER — Inpatient Hospital Stay (HOSPITAL_BASED_OUTPATIENT_CLINIC_OR_DEPARTMENT_OTHER): Payer: Medicare Other | Admitting: Oncology

## 2019-12-01 ENCOUNTER — Encounter: Payer: Self-pay | Admitting: Oncology

## 2019-12-01 ENCOUNTER — Inpatient Hospital Stay: Payer: Medicare Other

## 2019-12-01 ENCOUNTER — Other Ambulatory Visit: Payer: Self-pay

## 2019-12-01 ENCOUNTER — Inpatient Hospital Stay: Payer: Medicare Other | Attending: Oncology

## 2019-12-01 ENCOUNTER — Other Ambulatory Visit: Payer: Self-pay | Admitting: *Deleted

## 2019-12-01 VITALS — BP 147/69 | HR 16 | Temp 97.9°F | Ht 62.0 in | Wt 207.0 lb

## 2019-12-01 DIAGNOSIS — D6481 Anemia due to antineoplastic chemotherapy: Secondary | ICD-10-CM | POA: Insufficient documentation

## 2019-12-01 DIAGNOSIS — D6959 Other secondary thrombocytopenia: Secondary | ICD-10-CM

## 2019-12-01 DIAGNOSIS — K521 Toxic gastroenteritis and colitis: Secondary | ICD-10-CM | POA: Diagnosis not present

## 2019-12-01 DIAGNOSIS — R197 Diarrhea, unspecified: Secondary | ICD-10-CM | POA: Diagnosis not present

## 2019-12-01 DIAGNOSIS — D701 Agranulocytosis secondary to cancer chemotherapy: Secondary | ICD-10-CM | POA: Diagnosis not present

## 2019-12-01 DIAGNOSIS — D649 Anemia, unspecified: Secondary | ICD-10-CM

## 2019-12-01 DIAGNOSIS — Z5112 Encounter for antineoplastic immunotherapy: Secondary | ICD-10-CM | POA: Diagnosis present

## 2019-12-01 DIAGNOSIS — T451X5A Adverse effect of antineoplastic and immunosuppressive drugs, initial encounter: Secondary | ICD-10-CM

## 2019-12-01 DIAGNOSIS — C50919 Malignant neoplasm of unspecified site of unspecified female breast: Secondary | ICD-10-CM | POA: Diagnosis present

## 2019-12-01 DIAGNOSIS — Z5111 Encounter for antineoplastic chemotherapy: Secondary | ICD-10-CM

## 2019-12-01 LAB — CBC WITH DIFFERENTIAL/PLATELET
Abs Immature Granulocytes: 0.08 10*3/uL — ABNORMAL HIGH (ref 0.00–0.07)
Basophils Absolute: 0 10*3/uL (ref 0.0–0.1)
Basophils Relative: 0 %
Eosinophils Absolute: 0 10*3/uL (ref 0.0–0.5)
Eosinophils Relative: 0 %
HCT: 35.4 % — ABNORMAL LOW (ref 36.0–46.0)
Hemoglobin: 10.7 g/dL — ABNORMAL LOW (ref 12.0–15.0)
Immature Granulocytes: 1 %
Lymphocytes Relative: 14 %
Lymphs Abs: 1.1 10*3/uL (ref 0.7–4.0)
MCH: 24.8 pg — ABNORMAL LOW (ref 26.0–34.0)
MCHC: 30.2 g/dL (ref 30.0–36.0)
MCV: 81.9 fL (ref 80.0–100.0)
Monocytes Absolute: 0.3 10*3/uL (ref 0.1–1.0)
Monocytes Relative: 4 %
Neutro Abs: 6.5 10*3/uL (ref 1.7–7.7)
Neutrophils Relative %: 81 %
Platelets: 149 10*3/uL — ABNORMAL LOW (ref 150–400)
RBC: 4.32 MIL/uL (ref 3.87–5.11)
RDW: 17.2 % — ABNORMAL HIGH (ref 11.5–15.5)
WBC: 8 10*3/uL (ref 4.0–10.5)
nRBC: 0 % (ref 0.0–0.2)

## 2019-12-01 LAB — COMPREHENSIVE METABOLIC PANEL
ALT: 18 U/L (ref 0–44)
AST: 20 U/L (ref 15–41)
Albumin: 4 g/dL (ref 3.5–5.0)
Alkaline Phosphatase: 91 U/L (ref 38–126)
Anion gap: 7 (ref 5–15)
BUN: 24 mg/dL — ABNORMAL HIGH (ref 8–23)
CO2: 27 mmol/L (ref 22–32)
Calcium: 9.6 mg/dL (ref 8.9–10.3)
Chloride: 105 mmol/L (ref 98–111)
Creatinine, Ser: 1.03 mg/dL — ABNORMAL HIGH (ref 0.44–1.00)
GFR calc Af Amer: >60 mL/min (ref >60)
GFR calc non Af Amer: 57 mL/min — ABNORMAL LOW (ref >60)
Glucose, Bld: 111 mg/dL — ABNORMAL HIGH (ref 70–99)
Potassium: 3.8 mmol/L (ref 3.5–5.1)
Sodium: 139 mmol/L (ref 135–145)
Total Bilirubin: 0.4 mg/dL (ref 0.3–1.2)
Total Protein: 7.2 g/dL (ref 6.5–8.1)

## 2019-12-01 MED ORDER — ACETAMINOPHEN 325 MG PO TABS
650.0000 mg | ORAL_TABLET | Freq: Once | ORAL | Status: AC
  Administered 2019-12-01: 650 mg via ORAL
  Filled 2019-12-01: qty 2

## 2019-12-01 MED ORDER — SODIUM CHLORIDE 0.9 % IV SOLN
Freq: Once | INTRAVENOUS | Status: AC
  Filled 2019-12-01: qty 250

## 2019-12-01 MED ORDER — DIPHENOXYLATE-ATROPINE 2.5-0.025 MG PO TABS
1.0000 | ORAL_TABLET | Freq: Once | ORAL | Status: AC
  Administered 2019-12-01: 11:00:00 1 via ORAL
  Filled 2019-12-01: qty 1

## 2019-12-01 MED ORDER — DIPHENHYDRAMINE HCL 25 MG PO CAPS
50.0000 mg | ORAL_CAPSULE | Freq: Once | ORAL | Status: AC
  Administered 2019-12-01: 10:00:00 50 mg via ORAL
  Filled 2019-12-01: qty 2

## 2019-12-01 MED ORDER — SODIUM CHLORIDE 0.9 % IV SOLN
420.0000 mg | Freq: Once | INTRAVENOUS | Status: AC
  Administered 2019-12-01: 12:00:00 420 mg via INTRAVENOUS
  Filled 2019-12-01: qty 14

## 2019-12-01 MED ORDER — SODIUM CHLORIDE 0.9 % IV SOLN
75.0000 mg/m2 | Freq: Once | INTRAVENOUS | Status: AC
  Administered 2019-12-01: 150 mg via INTRAVENOUS
  Filled 2019-12-01: qty 15

## 2019-12-01 MED ORDER — SODIUM CHLORIDE 0.9 % IV SOLN
150.0000 mg | Freq: Once | INTRAVENOUS | Status: AC
  Administered 2019-12-01: 10:00:00 150 mg via INTRAVENOUS
  Filled 2019-12-01: qty 5

## 2019-12-01 MED ORDER — SODIUM CHLORIDE 0.9 % IV SOLN
540.0000 mg | Freq: Once | INTRAVENOUS | Status: AC
  Administered 2019-12-01: 14:00:00 540 mg via INTRAVENOUS
  Filled 2019-12-01: qty 54

## 2019-12-01 MED ORDER — HEPARIN SOD (PORK) LOCK FLUSH 100 UNIT/ML IV SOLN
500.0000 [IU] | Freq: Once | INTRAVENOUS | Status: AC | PRN
  Administered 2019-12-01: 500 [IU]
  Filled 2019-12-01: qty 5

## 2019-12-01 MED ORDER — DIPHENOXYLATE-ATROPINE 2.5-0.025 MG PO TABS: 1.0000 | ORAL_TABLET | Freq: Four times a day (QID) | ORAL | 0 refills | Status: DC | PRN

## 2019-12-01 MED ORDER — SODIUM CHLORIDE 0.9 % IV SOLN
10.0000 mg | Freq: Once | INTRAVENOUS | Status: AC
  Administered 2019-12-01: 10 mg via INTRAVENOUS
  Filled 2019-12-01: qty 10

## 2019-12-01 MED ORDER — PEGFILGRASTIM 6 MG/0.6ML ~~LOC~~ PSKT
6.0000 mg | PREFILLED_SYRINGE | Freq: Once | SUBCUTANEOUS | Status: AC
  Administered 2019-12-01: 6 mg via SUBCUTANEOUS
  Filled 2019-12-01: qty 0.6

## 2019-12-01 MED ORDER — PALONOSETRON HCL INJECTION 0.25 MG/5ML
0.2500 mg | Freq: Once | INTRAVENOUS | Status: AC
  Administered 2019-12-01: 0.25 mg via INTRAVENOUS
  Filled 2019-12-01: qty 5

## 2019-12-01 MED ORDER — TRASTUZUMAB-DKST CHEMO 150 MG IV SOLR
600.0000 mg | Freq: Once | INTRAVENOUS | Status: AC
  Administered 2019-12-01: 600 mg via INTRAVENOUS
  Filled 2019-12-01: qty 28.57

## 2019-12-01 NOTE — Progress Notes (Signed)
Patient stated that she had been doing well with no concerns. However, patient stated that she woke up this AM and noticed that she had bilateral lower extremity edema.

## 2019-12-02 NOTE — Progress Notes (Signed)
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 It showed 2 suspicious lesions in the right breast. Diagnostic mammogram and ultrasound confirmed 2 masses in the 9 o'clock position of the right breast measuring 1.1 x 1.2 x 1.2 cm and the other one measuring 0.8 x 0.8 x 0.9 cm. These 2 masses were 1.7 cm apart. Sonographic evaluation of the right axilla did not show any enlarged adenopathy. 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  Baseline MUGA scan showed EF of 58%. MRI showed two distinct masses in the right breast 1.5 cm and 1.2 cm 2 cm apart no additional suspicious masses or abnormal enhancement identified. No suspicious adenopathy   Interval history-patient has occasional on and off diarrhea which is currently well controlled with Imodium.  Denies other complaints at this time  ECOG PS- 1 Pain scale- 0 Opioid associated constipation- no  Review of systems- Review of Systems  Constitutional: Negative for chills, fever, malaise/fatigue and weight loss.  HENT: Negative for congestion, ear discharge and nosebleeds.   Eyes: Negative for blurred vision.  Respiratory: Negative for cough,  hemoptysis, sputum production, shortness of breath and wheezing.   Cardiovascular: Negative for chest pain, palpitations, orthopnea and claudication.  Gastrointestinal: Positive for diarrhea. Negative for abdominal pain, blood in stool, constipation, heartburn, melena, nausea and vomiting.  Genitourinary: Negative for dysuria, flank pain, frequency, hematuria and urgency.  Musculoskeletal: Negative for back pain, joint pain and myalgias.  Skin: Negative for rash.  Neurological: Negative for dizziness, tingling, focal weakness, seizures, weakness and headaches.  Endo/Heme/Allergies: Does not bruise/bleed easily.  Psychiatric/Behavioral: Negative for depression and suicidal ideas. The patient does not have insomnia.       Allergies  Allergen Reactions  . Statins     Other reaction(s): Muscle Pain Possible hives also     Past Medical History:  Diagnosis Date  . Adverse effect of anesthesia    hard to awaken  . Anemia   . Arthritis   . Asthma   . Asthma without status asthmaticus    unspecified; Take advair each night  . Breast cancer ([place])   . Bronchitis   . Constipation   . Diabetes ([place]) 2010  . Diabetes mellitus type 2, uncomplicated ([place])   . Diabetes mellitus without complication ([place])   . Encounter for blood transfusion    hysterectomy 9-10 years ago  . Heart murmur    unspecified  . Hyperlipidemia   . Hypertension 1991  . Hypothyroid   . Multiple thyroid nodules   . Neuromuscular disorder (HCC)    neuropathy  . Osteoarthritis of right hip 02/04/2017  . Palpitations   . Pernicious anemia   . Pneumonia   . Sleep apnea    Use C-PAP; can't use mouth piece. Anxiety  . Thyroid disease  nodules  . Thyroid nodule    x2  . Urinary urgency      Past Surgical History:  Procedure Laterality Date  . ABDOMINAL HYSTERECTOMY  2011   partial  . ABDOMINAL SURGERY    . CARPAL TUNNEL RELEASE Left 2008  . CARPAL TUNNEL RELEASE Right   . CHOLECYSTECTOMY  1982  .  COLONOSCOPY WITH PROPOFOL N/A 10/05/2017   Procedure: COLONOSCOPY WITH PROPOFOL;  Surgeon: Manya Silvas, MD;  Location: [facility] ENDOSCOPY;  Service: Endoscopy;  Laterality: N/A;  . DIAGNOSTIC LAPAROSCOPY    . JOINT REPLACEMENT Right 02/09/2017   TOTAL HIP REPLACEMENT, DR. CARTER. [place]  . KNEE ARTHROSCOPY  1990s  . PORTACATH PLACEMENT Left 11/07/2019   Procedure: INSERTION PORT-A-CATH;  Surgeon: Herbert Pun, MD;  Location: [facility] ORS;  Service: General;  Laterality: Left;  . TOTAL HIP ARTHROPLASTY Left 06/23/2017   Procedure: TOTAL HIP ARTHROPLASTY ANTERIOR APPROACH;  Surgeon: Hessie Knows, MD;  Location: [facility] ORS;  Service: Orthopedics;  Laterality: Left;    Social History   Socioeconomic History  . Marital status: Married    Spouse name: Not on file  . Number of children: 2  . Years of education: some college  . Highest education level: Not on file  Occupational History  . Occupation: retired    Comment: [employer]  Tobacco Use  . Smoking status: Never Smoker  . Smokeless tobacco: Never Used  Substance and Sexual Activity  . Alcohol use: No  . Drug use: No  . Sexual activity: Not on file  Other Topics Concern  . Not on file  Social History Narrative  . Not on file   Social Determinants of Health   Financial Resource Strain:   . Difficulty of Paying Living Expenses: Not on file  Food Insecurity:   . Worried About Charity fundraiser in the Last Year: Not on file  . Ran Out of Food in the Last Year: Not on file  Transportation Needs:   . Lack of Transportation (Medical): Not on file  . Lack of Transportation (Non-Medical): Not on file  Physical Activity:   . Days of Exercise per Week: Not on file  . Minutes of Exercise per Session: Not on file  Stress:   . Feeling of Stress : Not on file  Social Connections:   . Frequency of Communication with Friends and Family: Not on file  . Frequency of Social Gatherings with Friends and Family:  Not on file  . Attends Religious Services: Not on file  . Active Member of Clubs or Organizations: Not on file  . Attends Archivist Meetings: Not on file  . Marital Status: Not on file  Intimate Partner Violence:   . Fear of Current or Ex-Partner: Not on file  . Emotionally Abused: Not on file  . Physically Abused: Not on file  . Sexually Abused: Not on file    Family History  Problem Relation Age of Onset  . Heart disease Mother   . Hypertension Mother   . Arrhythmia Mother   . Heart attack Mother   . Diabetes Mother   . Hypertension Father   . Parkinson's disease Father   . Coronary artery disease Maternal Uncle   . Heart attack Maternal Uncle   . Diabetes Sister   . Diabetes Brother   . Heart disease Brother   . Breast cancer Neg Hx      Current Outpatient Medications:  .  carvedilol (COREG) 12.5 MG tablet,  Take 12.5 mg by mouth 2 (two) times daily. , Disp: , Rfl:  .  cholecalciferol (VITAMIN D) 1000 units tablet, Take 1,000 Units by mouth daily., Disp: , Rfl:  .  dexamethasone (DECADRON) 4 MG tablet, Take 2 tablets (8 mg total) by mouth 2 (two) times daily. Start the day before Taxotere. Take once the day after, then 2 times a day x 2d., Disp: 30 tablet, Rfl: 1 .  Fluticasone-Salmeterol (ADVAIR) 250-50 MCG/DOSE AEPB, Inhale 1 puff into the lungs 2 (two) times daily. , Disp: , Rfl:  .  gabapentin (NEURONTIN) 100 MG capsule, Take 200 mg by mouth 2 (two) times daily., Disp: , Rfl:  .  lidocaine-prilocaine (EMLA) cream, Apply to affected area once (Patient taking differently: Apply 1 application topically once. Apply to affected area once), Disp: 30 g, Rfl: 3 .  loratadine (CLARITIN) 10 MG tablet, Take 10 mg by mouth daily as needed for allergies., Disp: , Rfl:  .  LORazepam (ATIVAN) 0.5 MG tablet, Take 1 tablet (0.5 mg total) by mouth every 6 (six) hours as needed (Nausea or vomiting)., Disp: 30 tablet, Rfl: 0 .  metFORMIN (GLUCOPHAGE) 500 MG tablet, Take 500 mg by  mouth daily. , Disp: , Rfl:  .  potassium chloride SA (KLOR-CON) 20 MEQ tablet, Take 1 tablet (20 mEq total) by mouth daily., Disp: 30 tablet, Rfl: 1 .  solifenacin (VESICARE) 10 MG tablet, Take 10 mg by mouth daily. , Disp: , Rfl:  .  telmisartan-hydrochlorothiazide (MICARDIS HCT) 80-25 MG tablet, Take 1 tablet by mouth daily. , Disp: , Rfl:  .  acetaminophen (TYLENOL) 500 MG tablet, Take 500 mg by mouth every 6 (six) hours as needed., Disp: , Rfl:  .  albuterol (PROVENTIL HFA;VENTOLIN HFA) 108 (90 Base) MCG/ACT inhaler, Inhale 2 puffs into the lungs every 6 (six) hours as needed for wheezing or shortness of breath., Disp: , Rfl:  .  diphenoxylate-atropine (LOMOTIL) 2.5-0.025 MG tablet, Take 1 tablet by mouth 4 (four) times daily as needed for diarrhea or loose stools., Disp: 60 tablet, Rfl: 0 .  ondansetron (ZOFRAN) 8 MG tablet, Take 1 tablet (8 mg total) by mouth 2 (two) times daily as needed (Nausea or vomiting). Begin 4 days after chemotherapy. (Patient not taking: Reported on 12/01/2019), Disp: 30 tablet, Rfl: 1 .  prochlorperazine (COMPAZINE) 10 MG tablet, Take 1 tablet (10 mg total) by mouth every 6 (six) hours as needed (Nausea or vomiting). (Patient not taking: Reported on 12/01/2019), Disp: 30 tablet, Rfl: 1  Physical exam:  Vitals:   12/01/19 0841 12/01/19 0933  BP: (!) 147/69   Pulse: 74 (!) 16  Temp: 97.9 F (36.6 C)   TempSrc: Tympanic   Weight: 207 lb (93.9 kg)   Height: 5' 2"  (1.575 m)    Physical Exam Constitutional:      General: She is not in acute distress. HENT:     Head: Normocephalic and atraumatic.  Eyes:     Pupils: Pupils are equal, round, and reactive to light.  Cardiovascular:     Rate and Rhythm: Normal rate and regular rhythm.     Heart sounds: Normal heart sounds.  Pulmonary:     Effort: Pulmonary effort is normal.     Breath sounds: Normal breath sounds.  Abdominal:     General: Bowel sounds are normal.     Palpations: Abdomen is soft.    Musculoskeletal:     Cervical back: Normal range of motion.  Skin:    General: Skin  is warm and dry.  Neurological:     Mental Status: She is alert and oriented to person, place, and time.   Right breast mass not palpable.  No palpable right axillary adenopathy   CMP Latest Ref Rng & Units 12/01/2019  Glucose 70 - 99 mg/dL 111(H)  BUN 8 - 23 mg/dL 24(H)  Creatinine 0.44 - 1.00 mg/dL 1.03(H)  Sodium 135 - 145 mmol/L 139  Potassium 3.5 - 5.1 mmol/L 3.8  Chloride 98 - 111 mmol/L 105  CO2 22 - 32 mmol/L 27  Calcium 8.9 - 10.3 mg/dL 9.6  Total Protein 6.5 - 8.1 g/dL 7.2  Total Bilirubin 0.3 - 1.2 mg/dL 0.4  Alkaline Phos 38 - 126 U/L 91  AST 15 - 41 U/L 20  ALT 0 - 44 U/L 18   CBC Latest Ref Rng & Units 12/01/2019  WBC 4.0 - 10.5 K/uL 8.0  Hemoglobin 12.0 - 15.0 g/dL 10.7(L)  Hematocrit 36.0 - 46.0 % 35.4(L)  Platelets 150 - 400 K/uL 149(L)    No images are attached to the encounter.  MR BREAST BILATERAL W WO CONTRAST INC CAD  Result Date: 11/09/2019 CLINICAL DATA:  64 year old female with 2 sites of newly diagnosed right breast invasive mammary carcinoma. LABS:  None performed on site. EXAM: BILATERAL BREAST MRI WITH AND WITHOUT CONTRAST TECHNIQUE: Multiplanar, multisequence MR images of both breasts were obtained prior to and following the intravenous administration of 9 ml of Gadavist. Three-dimensional MR images were rendered by post-processing of the original MR data on an independent workstation. The three-dimensional MR images were interpreted, and findings are reported in the following complete MRI report for this study. Three dimensional images were evaluated at the independent DynaCad workstation COMPARISON:  Previous exam(s). FINDINGS: Breast composition: a. Almost entirely fat. Background parenchymal enhancement: Minimal Right breast: Susceptibility artifact from post biopsy clips are demonstrated in association with 2 enhancing masses in the lateral right breast at  posterior depth (series 6, image 44/112). These are consistent with the areas of biopsy proven malignancy at the 9 o'clock position 7 cm and 9 cm from the nipple. They measure 1.5 x 1.2 x 1.3 cm and 1.2 x 0.9 x 0.9 cm, respectively. No additional suspicious masses or abnormal enhancement are identified. Left breast: No suspicious mass or abnormal enhancement. Lymph nodes: No abnormal appearing lymph nodes. Ancillary findings:  None. IMPRESSION: 1. Two sites of biopsy proven malignancy in the lateral right breast. 2. No additional suspicious findings in either breast. 3. No suspicious lymphadenopathy. RECOMMENDATION: Per clinical treatment plan. BI-RADS CATEGORY  6: Known biopsy-proven malignancy. Electronically Signed   By: Kristopher Oppenheim M.D.   On: 11/09/2019 09:25   DG Chest Port 1 View  Result Date: 11/07/2019 CLINICAL DATA:  Shortness of breath EXAM: PORTABLE CHEST 1 VIEW COMPARISON:  None. FINDINGS: The heart size and mediastinal contours are within normal limits. There is interval placement of a left-sided MediPort catheter with the tip at the superior cavoatrial junction. Minimally increased bibasilar streaky atelectasis is seen. No large airspace consolidation or pleural effusion. No pneumothorax. Bilateral shoulder arthritis is seen. IMPRESSION: Interval placement of left-sided MediPort catheter with the tip at the superior cavoatrial junction. Mild bibasilar streaky atelectasis. Electronically Signed   By: Prudencio Pair M.D.   On: 11/07/2019 09:24   DG C-Arm 1-60 Min-No Report  Result Date: 11/07/2019 Fluoroscopy was utilized by the requesting physician.  No radiographic interpretation.     Assessment and plan- Patient is a 64 y.o. female with  invasive mammary carcinoma of the right breast clinical prognostic stage IamcT1 ccN0 cM0 ER/PR positive HER-2/neu positive.  She is here for on treatment assessment prior to cycle 2 of neoadjuvant TCHP.  Chemotherapy  Counts are okay to proceed with  cycle 2 of neoadjuvant TCHP chemotherapy today.  She will also receive 1 for Neulasta support.  Patient has baseline anemia likely secondary to chronic disease made slightly worse by chemotherapy.  Continue to monitor and I will add iron studies B12 and folate.  Mild chemo induced thrombocytopenia continue to monitor  Diarrhea secondary to Perjeta/Herceptin.  She will continue taking as needed Imodium but I will also add Lomotil to her regimen.  Patient advised to take Lomotil if Imodium does not work.  I will set her up for tentative IV fluids next week.  I will see her back in 3 weeks time with CBC with differential, CMP for cycle 3 of neoadjuvant TCHP chemotherapy   Visit Diagnosis 1. Invasive carcinoma of breast ([place])   2. Drug-induced diarrhea   3. Encounter for monoclonal antibody treatment for malignancy   4. Encounter for antineoplastic chemotherapy   5. Chemotherapy-induced thrombocytopenia   6. Normocytic anemia      Dr. Randa Evens, MD, MPH [facility] [phone] 12/02/2019 11:09 AM

## 2019-12-08 ENCOUNTER — Other Ambulatory Visit: Payer: Self-pay

## 2019-12-08 ENCOUNTER — Inpatient Hospital Stay: Payer: Medicare Other

## 2019-12-08 VITALS — BP 102/65 | HR 77 | Temp 97.8°F | Resp 18

## 2019-12-08 DIAGNOSIS — C50919 Malignant neoplasm of unspecified site of unspecified female breast: Secondary | ICD-10-CM

## 2019-12-08 DIAGNOSIS — Z95828 Presence of other vascular implants and grafts: Secondary | ICD-10-CM

## 2019-12-08 DIAGNOSIS — R197 Diarrhea, unspecified: Secondary | ICD-10-CM

## 2019-12-08 DIAGNOSIS — Z5112 Encounter for antineoplastic immunotherapy: Secondary | ICD-10-CM | POA: Diagnosis not present

## 2019-12-08 LAB — CBC WITH DIFFERENTIAL/PLATELET
Abs Immature Granulocytes: 0.6 10*3/uL — ABNORMAL HIGH (ref 0.00–0.07)
Band Neutrophils: 10 %
Basophils Absolute: 0 10*3/uL (ref 0.0–0.1)
Basophils Relative: 0 %
Eosinophils Absolute: 0 10*3/uL (ref 0.0–0.5)
Eosinophils Relative: 0 %
HCT: 32.7 % — ABNORMAL LOW (ref 36.0–46.0)
Hemoglobin: 9.9 g/dL — ABNORMAL LOW (ref 12.0–15.0)
Lymphocytes Relative: 23 %
Lymphs Abs: 2.1 10*3/uL (ref 0.7–4.0)
MCH: 24.7 pg — ABNORMAL LOW (ref 26.0–34.0)
MCHC: 30.3 g/dL (ref 30.0–36.0)
MCV: 81.5 fL (ref 80.0–100.0)
Metamyelocytes Relative: 4 %
Monocytes Absolute: 0.9 10*3/uL (ref 0.1–1.0)
Monocytes Relative: 10 %
Myelocytes: 3 %
Neutro Abs: 5.5 10*3/uL (ref 1.7–7.7)
Neutrophils Relative %: 50 %
Platelets: 87 10*3/uL — ABNORMAL LOW (ref 150–400)
RBC: 4.01 MIL/uL (ref 3.87–5.11)
RDW: 17.2 % — ABNORMAL HIGH (ref 11.5–15.5)
Smear Review: DECREASED
WBC: 9.1 10*3/uL (ref 4.0–10.5)
nRBC: 0 % (ref 0.0–0.2)

## 2019-12-08 LAB — COMPREHENSIVE METABOLIC PANEL
ALT: 24 U/L (ref 0–44)
AST: 21 U/L (ref 15–41)
Albumin: 3.8 g/dL (ref 3.5–5.0)
Alkaline Phosphatase: 97 U/L (ref 38–126)
Anion gap: 9 (ref 5–15)
BUN: 33 mg/dL — ABNORMAL HIGH (ref 8–23)
CO2: 26 mmol/L (ref 22–32)
Calcium: 9.1 mg/dL (ref 8.9–10.3)
Chloride: 103 mmol/L (ref 98–111)
Creatinine, Ser: 1.17 mg/dL — ABNORMAL HIGH (ref 0.44–1.00)
GFR calc Af Amer: 57 mL/min — ABNORMAL LOW (ref >60)
GFR calc non Af Amer: 49 mL/min — ABNORMAL LOW (ref >60)
Glucose, Bld: 111 mg/dL — ABNORMAL HIGH (ref 70–99)
Potassium: 3.6 mmol/L (ref 3.5–5.1)
Sodium: 138 mmol/L (ref 135–145)
Total Bilirubin: 0.5 mg/dL (ref 0.3–1.2)
Total Protein: 6.5 g/dL (ref 6.5–8.1)

## 2019-12-08 MED ORDER — PROCHLORPERAZINE EDISYLATE 10 MG/2ML IJ SOLN
10.0000 mg | Freq: Once | INTRAMUSCULAR | Status: AC
  Administered 2019-12-08: 10 mg via INTRAVENOUS
  Filled 2019-12-08: qty 2

## 2019-12-08 MED ORDER — HEPARIN SOD (PORK) LOCK FLUSH 100 UNIT/ML IV SOLN
INTRAVENOUS | Status: AC
  Filled 2019-12-08: qty 5

## 2019-12-08 MED ORDER — SODIUM CHLORIDE 0.9 % IV SOLN
INTRAVENOUS | Status: DC
  Filled 2019-12-08: qty 250

## 2019-12-08 MED ORDER — SODIUM CHLORIDE 0.9 % IV SOLN
Freq: Once | INTRAVENOUS | Status: AC
  Filled 2019-12-08: qty 250

## 2019-12-08 MED ORDER — HEPARIN SOD (PORK) LOCK FLUSH 100 UNIT/ML IV SOLN
500.0000 [IU] | Freq: Once | INTRAVENOUS | Status: AC
  Administered 2019-12-08: 16:00:00 500 [IU] via INTRAVENOUS
  Filled 2019-12-08: qty 5

## 2019-12-08 MED ORDER — SODIUM CHLORIDE 0.9% FLUSH
10.0000 mL | Freq: Once | INTRAVENOUS | Status: AC
  Administered 2019-12-08: 10 mL via INTRAVENOUS
  Filled 2019-12-08: qty 10

## 2019-12-17 ENCOUNTER — Emergency Department
Admission: EM | Admit: 2019-12-17 | Discharge: 2019-12-18 | Disposition: A | Discharge status: Home / Self Care | Payer: Medicare Other | Attending: Emergency Medicine | Admitting: Emergency Medicine

## 2019-12-17 ENCOUNTER — Other Ambulatory Visit: Payer: Self-pay

## 2019-12-17 DIAGNOSIS — Z96643 Presence of artificial hip joint, bilateral: Secondary | ICD-10-CM | POA: Insufficient documentation

## 2019-12-17 DIAGNOSIS — E119 Type 2 diabetes mellitus without complications: Secondary | ICD-10-CM | POA: Diagnosis not present

## 2019-12-17 DIAGNOSIS — J45909 Unspecified asthma, uncomplicated: Secondary | ICD-10-CM | POA: Diagnosis not present

## 2019-12-17 DIAGNOSIS — C50919 Malignant neoplasm of unspecified site of unspecified female breast: Secondary | ICD-10-CM | POA: Insufficient documentation

## 2019-12-17 DIAGNOSIS — K219 Gastro-esophageal reflux disease without esophagitis: Secondary | ICD-10-CM

## 2019-12-17 DIAGNOSIS — E860 Dehydration: Secondary | ICD-10-CM | POA: Insufficient documentation

## 2019-12-17 DIAGNOSIS — I10 Essential (primary) hypertension: Secondary | ICD-10-CM | POA: Insufficient documentation

## 2019-12-17 LAB — COMPREHENSIVE METABOLIC PANEL
ALT: 19 U/L (ref 0–44)
AST: 18 U/L (ref 15–41)
Albumin: 3.9 g/dL (ref 3.5–5.0)
Alkaline Phosphatase: 90 U/L (ref 38–126)
Anion gap: 9 (ref 5–15)
BUN: 38 mg/dL — ABNORMAL HIGH (ref 8–23)
CO2: 28 mmol/L (ref 22–32)
Calcium: 9.8 mg/dL (ref 8.9–10.3)
Chloride: 99 mmol/L (ref 98–111)
Creatinine, Ser: 1.51 mg/dL — ABNORMAL HIGH (ref 0.44–1.00)
GFR calc Af Amer: 42 mL/min — ABNORMAL LOW (ref >60)
GFR calc non Af Amer: 36 mL/min — ABNORMAL LOW (ref >60)
Glucose, Bld: 134 mg/dL — ABNORMAL HIGH (ref 70–99)
Potassium: 3.5 mmol/L (ref 3.5–5.1)
Sodium: 136 mmol/L (ref 135–145)
Total Bilirubin: 0.7 mg/dL (ref 0.3–1.2)
Total Protein: 6.9 g/dL (ref 6.5–8.1)

## 2019-12-17 LAB — CBC
HCT: 34.1 % — ABNORMAL LOW (ref 36.0–46.0)
Hemoglobin: 10.9 g/dL — ABNORMAL LOW (ref 12.0–15.0)
MCH: 25.5 pg — ABNORMAL LOW (ref 26.0–34.0)
MCHC: 32 g/dL (ref 30.0–36.0)
MCV: 79.9 fL — ABNORMAL LOW (ref 80.0–100.0)
Platelets: 147 10*3/uL — ABNORMAL LOW (ref 150–400)
RBC: 4.27 MIL/uL (ref 3.87–5.11)
RDW: 17.9 % — ABNORMAL HIGH (ref 11.5–15.5)
WBC: 5.3 10*3/uL (ref 4.0–10.5)
nRBC: 0 % (ref 0.0–0.2)

## 2019-12-17 LAB — URINALYSIS, COMPLETE (UACMP) WITH MICROSCOPIC
Bacteria, UA: NONE SEEN
Bilirubin Urine: NEGATIVE
Glucose, UA: NEGATIVE mg/dL
Hgb urine dipstick: NEGATIVE
Ketones, ur: NEGATIVE mg/dL
Nitrite: NEGATIVE
Protein, ur: NEGATIVE mg/dL
Specific Gravity, Urine: 1.017 (ref 1.005–1.030)
pH: 5 (ref 5.0–8.0)

## 2019-12-17 LAB — TROPONIN I (HIGH SENSITIVITY): Troponin I (High Sensitivity): 7 ng/L (ref <18)

## 2019-12-17 LAB — LIPASE, BLOOD: Lipase: 41 U/L (ref 11–51)

## 2019-12-17 MED ORDER — SODIUM CHLORIDE 0.9 % IV SOLN: Freq: Once | INTRAVENOUS | Status: AC

## 2019-12-17 MED ORDER — ONDANSETRON HCL 4 MG/2ML IJ SOLN
4.0000 mg | Freq: Once | INTRAMUSCULAR | Status: AC
  Administered 2019-12-17: 4 mg via INTRAVENOUS
  Filled 2019-12-17: qty 2

## 2019-12-17 MED ORDER — ONDANSETRON 4 MG PO TBDP
4.0000 mg | ORAL_TABLET | Freq: Once | ORAL | Status: AC | PRN
  Administered 2019-12-17: 4 mg via ORAL

## 2019-12-17 MED ORDER — ONDANSETRON 4 MG PO TBDP
ORAL_TABLET | ORAL | Status: AC
  Filled 2019-12-17: qty 1

## 2019-12-17 MED ORDER — FAMOTIDINE IN NACL 20-0.9 MG/50ML-% IV SOLN
20.0000 mg | Freq: Once | INTRAVENOUS | Status: AC
  Administered 2019-12-17: 20 mg via INTRAVENOUS
  Filled 2019-12-17: qty 50

## 2019-12-17 NOTE — ED Triage Notes (Signed)
Pt states was sent by oncologist for dehydration. Pt states she last had chemo on 12/01/2019. Pt states she has not been able to eat or drink this week due to nausea. Pt also complains of epigastric pain, denies diarrhea, fever.

## 2019-12-17 NOTE — ED Provider Notes (Signed)
[facility] Emergency Department Provider Note       Time seen: ----------------------------------------- 8:06 PM on 12/17/2019 -----------------------------------------   I have reviewed the triage vital signs and the nursing notes.  HISTORY   Chief Complaint Dehydration    HPI Christina Mcclain is a 64 y.o. female with a history of anemia, asthma, diabetes, hyperlipidemia, hypertension, neuromuscular disorder who presents to the ED for possible dehydration.  She was sent by her oncologist for same.  She last had chemo on January 7.  Patient states she has not been able to eat or drink this week due to nausea, also complains of epigastric pain, denies diarrhea or fever.  Discomfort is 3 out of 10.  Past Medical History:  Diagnosis Date  . Adverse effect of anesthesia    hard to awaken  . Anemia   . Arthritis   . Asthma   . Asthma without status asthmaticus    unspecified; Take advair each night  . Breast cancer ([place])   . Bronchitis   . Constipation   . Diabetes ([place]) 2010  . Diabetes mellitus type 2, uncomplicated ([place])   . Diabetes mellitus without complication ([place])   . Encounter for blood transfusion    hysterectomy 9-10 years ago  . Heart murmur    unspecified  . Hyperlipidemia   . Hypertension 1991  . Hypothyroid   . Multiple thyroid nodules   . Neuromuscular disorder (HCC)    neuropathy  . Osteoarthritis of right hip 02/04/2017  . Palpitations   . Pernicious anemia   . Pneumonia   . Sleep apnea    Use C-PAP; can't use mouth piece. Anxiety  . Thyroid disease    nodules  . Thyroid nodule    x2  . Urinary urgency     Patient Active Problem List   Diagnosis Date Noted  . Palpitations 11/09/2019  . Pelvic pressure in female 11/09/2019  . Invasive carcinoma of breast ([place]) 10/31/2019  . Goals of care, counseling/discussion 10/31/2019  . Nonintractable headache 08/03/2019  . Tinnitus of both ears 08/03/2019  . Bilateral hand  numbness 09/14/2018  . Bilateral hand pain 09/14/2018  . Myalgia due to statin 08/26/2018  . BMI 40.0-44.9, adult ([place]) 12/16/2017  . Primary localized osteoarthrosis of left hip 06/23/2017  . Controlled type 2 diabetes mellitus with complication, without long-term current use of insulin ([place]) 03/17/2017  . Pernicious anemia 03/17/2017  . Primary osteoarthritis involving multiple joints 03/17/2017  . 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 At high risk for falls 02/11/2017  . Mild persistent asthma without complication 0000000  . Impaired mobility and ADLs 02/11/2017  . Myoclonus 07/23/2016  . DISH (diffuse idiopathic skeletal hyperostosis) 07/23/2016  . Obstructive sleep apnea syndrome 07/23/2016  . Thyromegaly 07/23/2016  . Left thyroid nodule 09/04/2014  . Chest pain, unspecified 11/14/2012  . High cholesterol 11/14/2012  . Gastro-esophageal reflux disease without esophagitis 11/14/2012  . Family history of early CAD 11/14/2012  . Overactive bladder 04/12/2012  . Hemorrhoids 01/08/2010  . Essential (primary) hypertension 01/08/2010  . Osteoarthritis of right hip 01/08/2010    Past Surgical History:  Procedure Laterality Date  . ABDOMINAL HYSTERECTOMY  2011   partial  . ABDOMINAL SURGERY    . CARPAL TUNNEL RELEASE Left 2008  . CARPAL TUNNEL RELEASE Right   . CHOLECYSTECTOMY  1982  . COLONOSCOPY WITH PROPOFOL N/A 10/05/2017   Procedure: COLONOSCOPY WITH PROPOFOL;  Surgeon: Manya Silvas, MD;  Location: [facility] ENDOSCOPY;  Service:  Endoscopy;  Laterality: N/A;  . DIAGNOSTIC LAPAROSCOPY    . JOINT REPLACEMENT Right 02/09/2017   TOTAL HIP REPLACEMENT, DR. CARTER. [place]  . KNEE ARTHROSCOPY  1990s  . PORTACATH PLACEMENT Left 11/07/2019   Procedure: INSERTION PORT-A-CATH;  Surgeon: Herbert Pun, MD;  Location: [facility] ORS;  Service: General;  Laterality: Left;  . TOTAL HIP ARTHROPLASTY Left 06/23/2017   Procedure: TOTAL HIP ARTHROPLASTY ANTERIOR APPROACH;   Surgeon: Hessie Knows, MD;  Location: [facility] ORS;  Service: Orthopedics;  Laterality: Left;    Allergies Statins  Social History Social History   Tobacco Use  . Smoking status: Never Smoker  . Smokeless tobacco: Never Used  Substance Use Topics  . Alcohol use: No  . Drug use: No   Review of Systems Constitutional: Negative for fever. Cardiovascular: Negative for chest pain. Respiratory: Negative for shortness of breath. Gastrointestinal: Positive for epigastric pain, positive for nausea, poor appetite Musculoskeletal: Negative for back pain. Skin: Negative for rash. Neurological: Negative for headaches, focal weakness or numbness.  All systems negative/normal/unremarkable except as stated in the HPI  ____________________________________________   PHYSICAL EXAM:  VITAL SIGNS: ED Triage Vitals  Enc Vitals Group     BP 12/17/19 1927 117/64     Pulse Rate 12/17/19 1927 71     Resp 12/17/19 1927 16     Temp 12/17/19 1927 98.2 F (36.8 C)     Temp Source 12/17/19 1927 Oral     SpO2 12/17/19 1927 100 %     Weight 12/17/19 1928 182 lb (82.6 kg)     Height 12/17/19 1928 5\' 2"  (1.575 m)     Head Circumference --      Peak Flow --      Pain Score 12/17/19 1928 3     Pain Loc --      Pain Edu? --      Excl. in [place]? --     Constitutional: Alert and oriented. Well appearing and in no distress. Eyes: Conjunctivae are normal. Normal extraocular movements. Cardiovascular: Normal rate, regular rhythm. No murmurs, rubs, or gallops. Respiratory: Normal respiratory effort without tachypnea nor retractions. Breath sounds are clear and equal bilaterally. No wheezes/rales/rhonchi. Gastrointestinal: Soft and nontender. Normal bowel sounds Musculoskeletal: Nontender with normal range of motion in extremities. No lower extremity tenderness nor edema. Neurologic:  Normal speech and language. No gross focal neurologic deficits are appreciated.  Skin:  Skin is warm, dry and intact. No rash  noted. Psychiatric: Mood and affect are normal. Speech and behavior are normal.  ____________________________________________  EKG: Interpreted by me.  Sinus rhythm with artifact, nonspecific ST segment abnormalities, normal axis, normal QT  ____________________________________________  ED COURSE:  As part of my medical decision making, I reviewed the following data within the [place] History obtained from family if available, nursing notes, old chart and ekg, as well as notes from prior ED visits. Patient presented for possible dehydration, we will assess with labs and imaging as indicated at this time.   Procedures  Christina Mcclain was evaluated in Emergency Department on 12/17/2019 for the symptoms described in the history of present illness. She was evaluated in the context of the global COVID-19 pandemic, which necessitated consideration that the patient might be at risk for infection with the SARS-CoV-2 virus that causes COVID-19. Institutional protocols and algorithms that pertain to the evaluation of patients at risk for COVID-19 are in a state of rapid change based on information released by regulatory bodies including the CDC and federal  and state organizations. These policies and algorithms were followed during the patient's care in the ED.  ____________________________________________   LABS (pertinent positives/negatives)  Labs Reviewed  COMPREHENSIVE METABOLIC PANEL - Abnormal; Notable for the following components:      Result Value   Glucose, Bld 134 (*)    BUN 38 (*)    Creatinine, Ser 1.51 (*)    GFR calc non Af Amer 36 (*)    GFR calc Af Amer 42 (*)    All other components within normal limits  CBC - Abnormal; Notable for the following components:   Hemoglobin 10.9 (*)    HCT 34.1 (*)    MCV 79.9 (*)    MCH 25.5 (*)    RDW 17.9 (*)    Platelets 147 (*)    All other components within normal limits  URINALYSIS, COMPLETE (UACMP) WITH  MICROSCOPIC - Abnormal; Notable for the following components:   Color, Urine YELLOW (*)    APPearance CLOUDY (*)    Leukocytes,Ua SMALL (*)    All other components within normal limits  URINE CULTURE  LIPASE, BLOOD  TROPONIN I (HIGH SENSITIVITY)  TROPONIN I (HIGH SENSITIVITY)  ___________________________________________   DIFFERENTIAL DIAGNOSIS   Dehydration, electrolyte abnormality, anemia, occult infection  FINAL ASSESSMENT AND PLAN  Dehydration   Plan: The patient had presented for possible dehydration. Patient's labs indicated dehydration and are borderline suggestive of urinary tract infection.  Urine culture was sent.  She was given 2 L of IV fluids and otherwise appears cleared for outpatient follow-up.   Laurence Aly, MD    Note: This note was generated in part or whole with voice recognition software. Voice recognition is usually quite accurate but there are transcription errors that can and very often do occur. I apologize for any typographical errors that were not detected and corrected.     Earleen Newport, MD 12/17/19 2223

## 2019-12-18 DIAGNOSIS — E860 Dehydration: Secondary | ICD-10-CM | POA: Diagnosis not present

## 2019-12-18 LAB — TROPONIN I (HIGH SENSITIVITY): Troponin I (High Sensitivity): 8 ng/L (ref <18)

## 2019-12-19 LAB — URINE CULTURE: Culture: <10000 — AB

## 2019-12-22 ENCOUNTER — Inpatient Hospital Stay: Payer: Medicare Other

## 2019-12-22 ENCOUNTER — Inpatient Hospital Stay (HOSPITAL_BASED_OUTPATIENT_CLINIC_OR_DEPARTMENT_OTHER): Payer: Medicare Other | Admitting: Oncology

## 2019-12-22 ENCOUNTER — Encounter: Payer: Self-pay | Admitting: Oncology

## 2019-12-22 ENCOUNTER — Other Ambulatory Visit: Payer: Self-pay

## 2019-12-22 VITALS — BP 102/60 | HR 66 | Temp 97.2°F | Resp 16 | Wt 195.1 lb

## 2019-12-22 DIAGNOSIS — T451X5A Adverse effect of antineoplastic and immunosuppressive drugs, initial encounter: Secondary | ICD-10-CM

## 2019-12-22 DIAGNOSIS — D701 Agranulocytosis secondary to cancer chemotherapy: Secondary | ICD-10-CM | POA: Diagnosis not present

## 2019-12-22 DIAGNOSIS — Z5112 Encounter for antineoplastic immunotherapy: Secondary | ICD-10-CM

## 2019-12-22 DIAGNOSIS — Z5111 Encounter for antineoplastic chemotherapy: Secondary | ICD-10-CM | POA: Diagnosis not present

## 2019-12-22 DIAGNOSIS — C50919 Malignant neoplasm of unspecified site of unspecified female breast: Secondary | ICD-10-CM

## 2019-12-22 DIAGNOSIS — D6959 Other secondary thrombocytopenia: Secondary | ICD-10-CM

## 2019-12-22 DIAGNOSIS — D6481 Anemia due to antineoplastic chemotherapy: Secondary | ICD-10-CM

## 2019-12-22 LAB — COMPREHENSIVE METABOLIC PANEL
ALT: 16 U/L (ref 0–44)
AST: 33 U/L (ref 15–41)
Albumin: 3.8 g/dL (ref 3.5–5.0)
Alkaline Phosphatase: 78 U/L (ref 38–126)
Anion gap: 8 (ref 5–15)
BUN: 28 mg/dL — ABNORMAL HIGH (ref 8–23)
CO2: 27 mmol/L (ref 22–32)
Calcium: 9.3 mg/dL (ref 8.9–10.3)
Chloride: 103 mmol/L (ref 98–111)
Creatinine, Ser: 1.21 mg/dL — ABNORMAL HIGH (ref 0.44–1.00)
GFR calc Af Amer: 55 mL/min — ABNORMAL LOW (ref >60)
GFR calc non Af Amer: 47 mL/min — ABNORMAL LOW (ref >60)
Glucose, Bld: 105 mg/dL — ABNORMAL HIGH (ref 70–99)
Potassium: 3.1 mmol/L — ABNORMAL LOW (ref 3.5–5.1)
Sodium: 138 mmol/L (ref 135–145)
Total Bilirubin: 0.6 mg/dL (ref 0.3–1.2)
Total Protein: 6.5 g/dL (ref 6.5–8.1)

## 2019-12-22 LAB — CBC WITH DIFFERENTIAL/PLATELET
Abs Immature Granulocytes: 0.01 10*3/uL (ref 0.00–0.07)
Basophils Absolute: 0.1 10*3/uL (ref 0.0–0.1)
Basophils Relative: 2 %
Eosinophils Absolute: 0.1 10*3/uL (ref 0.0–0.5)
Eosinophils Relative: 2 %
HCT: 31.2 % — ABNORMAL LOW (ref 36.0–46.0)
Hemoglobin: 9.4 g/dL — ABNORMAL LOW (ref 12.0–15.0)
Immature Granulocytes: 0 %
Lymphocytes Relative: 45 %
Lymphs Abs: 1.8 10*3/uL (ref 0.7–4.0)
MCH: 25.1 pg — ABNORMAL LOW (ref 26.0–34.0)
MCHC: 30.1 g/dL (ref 30.0–36.0)
MCV: 83.4 fL (ref 80.0–100.0)
Monocytes Absolute: 0.5 10*3/uL (ref 0.1–1.0)
Monocytes Relative: 13 %
Neutro Abs: 1.5 10*3/uL — ABNORMAL LOW (ref 1.7–7.7)
Neutrophils Relative %: 38 %
Platelets: 128 10*3/uL — ABNORMAL LOW (ref 150–400)
RBC: 3.74 MIL/uL — ABNORMAL LOW (ref 3.87–5.11)
RDW: 18.1 % — ABNORMAL HIGH (ref 11.5–15.5)
WBC: 3.9 10*3/uL — ABNORMAL LOW (ref 4.0–10.5)
nRBC: 0 % (ref 0.0–0.2)

## 2019-12-22 LAB — FOLATE: Folate: 14.6 ng/mL (ref >5.9)

## 2019-12-22 LAB — FERRITIN: Ferritin: 304 ng/mL (ref 11–307)

## 2019-12-22 LAB — IRON AND TIBC
Iron: 45 ug/dL (ref 28–170)
Saturation Ratios: 19 % (ref 10.4–31.8)
TIBC: 242 ug/dL — ABNORMAL LOW (ref 250–450)
UIBC: 197 ug/dL

## 2019-12-22 LAB — VITAMIN B12: Vitamin B-12: 4516 pg/mL — ABNORMAL HIGH (ref 180–914)

## 2019-12-22 MED ORDER — SODIUM CHLORIDE 0.9% FLUSH
10.0000 mL | Freq: Once | INTRAVENOUS | Status: AC
  Administered 2019-12-22: 10 mL via INTRAVENOUS
  Filled 2019-12-22: qty 10

## 2019-12-22 MED ORDER — POTASSIUM CHLORIDE 20 MEQ PO PACK: 20.0000 meq | PACK | Freq: Every day | ORAL | 0 refills | Status: DC

## 2019-12-22 MED ORDER — HEPARIN SOD (PORK) LOCK FLUSH 100 UNIT/ML IV SOLN
500.0000 [IU] | Freq: Once | INTRAVENOUS | Status: AC
  Administered 2019-12-22: 500 [IU] via INTRAVENOUS
  Filled 2019-12-22: qty 5

## 2019-12-23 NOTE — Progress Notes (Signed)
Hematology/Oncology Consult note [facility]  [phone] [phone]  Patient Care Team: Kirk Ruths, MD as PCP - General (Internal Medicine) Theodore Demark, RN as Registered Nurse (Oncology)   Name of the patient: Christina Mcclain  [id number]  Jul 21, 1956   Date of visit: 12/23/19  Diagnosis-  invasive mammary carcinoma of the right breast clinical prognostic stage IamcT1c cN0 cM0ER/PR positive HER-2 positive  Chief complaint/ Reason for visit-on treatment assessment prior to cycle 3 of neoadjuvant TCHP chemotherapy  Heme/Onc history: patient is a 64 year old African-American female who underwent a routine screening mammogram on 10/06/2019. It showed 2 suspicious lesions in the right breast. Diagnostic mammogram and ultrasound confirmed 2 masses in the 9 o'clock position of the right breast measuring 1.1 x 1.2 x 1.2 cm and the other one measuring 0.8 x 0.8 x 0.9 cm. These 2 masses were 1.7 cm apart. Sonographic evaluation of the right axilla did not show any enlarged adenopathy. 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  Baseline MUGA scan showed EF of 58%. MRI showed two distinct masses in the right breast 1.5 cm and 1.2 cm 2 cm apart no additional suspicious masses or abnormal enhancement identified. No suspicious adenopathy  Interval history-she mainly reports fatigue.  Diarrhea is relatively well controlled with Imodium.  She was in the ER last week for symptoms of heartburn and dehydration and received 2 L of fluid along with Pepcid and felt better.  She is currently taking oral Pepcid and reports improvement in her heartburn symptoms.  ECOG PS- 1 Pain scale- 0  Review of systems- Review of Systems  Constitutional: Positive for malaise/fatigue. Negative for chills, fever and weight  loss.  HENT: Negative for congestion, ear discharge and nosebleeds.   Eyes: Negative for blurred vision.  Respiratory: Negative for cough, hemoptysis, sputum production, shortness of breath and wheezing.   Cardiovascular: Negative for chest pain, palpitations, orthopnea and claudication.  Gastrointestinal: Positive for diarrhea and heartburn. Negative for abdominal pain, blood in stool, constipation, melena, nausea and vomiting.  Genitourinary: Negative for dysuria, flank pain, frequency, hematuria and urgency.  Musculoskeletal: Negative for back pain, joint pain and myalgias.  Skin: Negative for rash.  Neurological: Negative for dizziness, tingling, focal weakness, seizures, weakness and headaches.  Endo/Heme/Allergies: Does not bruise/bleed easily.  Psychiatric/Behavioral: Negative for depression and suicidal ideas. The patient does not have insomnia.       Allergies  Allergen Reactions  . Statins     Other reaction(s): Muscle Pain Possible hives also     Past Medical History:  Diagnosis Date  . Adverse effect of anesthesia    hard to awaken  . Anemia   . Arthritis   . Asthma   . Asthma without status asthmaticus    unspecified; Take advair each night  . Breast cancer ([place])   . Bronchitis   . Constipation   . Diabetes ([place]) 2010  . Diabetes mellitus type 2, uncomplicated ([place])   . Diabetes mellitus without complication ([place])   . Encounter for blood transfusion    hysterectomy 9-10 years ago  . Heart murmur    unspecified  . Hyperlipidemia   . Hypertension 1991  . Hypothyroid   . Multiple thyroid nodules   . Neuromuscular disorder (HCC)    neuropathy  . Osteoarthritis of right hip 02/04/2017  . Palpitations   .  Pernicious anemia   . Pneumonia   . Sleep apnea    Use C-PAP; can't use mouth piece. Anxiety  . Thyroid disease    nodules  . Thyroid nodule    x2  . Urinary urgency      Past Surgical History:  Procedure Laterality Date  . ABDOMINAL  HYSTERECTOMY  2011   partial  . ABDOMINAL SURGERY    . CARPAL TUNNEL RELEASE Left 2008  . CARPAL TUNNEL RELEASE Right   . CHOLECYSTECTOMY  1982  . COLONOSCOPY WITH PROPOFOL N/A 10/05/2017   Procedure: COLONOSCOPY WITH PROPOFOL;  Surgeon: Manya Silvas, MD;  Location: [facility] ENDOSCOPY;  Service: Endoscopy;  Laterality: N/A;  . DIAGNOSTIC LAPAROSCOPY    . JOINT REPLACEMENT Right 02/09/2017   TOTAL HIP REPLACEMENT, DR. CARTER. [place]  . KNEE ARTHROSCOPY  1990s  . PORTACATH PLACEMENT Left 11/07/2019   Procedure: INSERTION PORT-A-CATH;  Surgeon: Herbert Pun, MD;  Location: [facility] ORS;  Service: General;  Laterality: Left;  . TOTAL HIP ARTHROPLASTY Left 06/23/2017   Procedure: TOTAL HIP ARTHROPLASTY ANTERIOR APPROACH;  Surgeon: Hessie Knows, MD;  Location: [facility] ORS;  Service: Orthopedics;  Laterality: Left;    Social History   Socioeconomic History  . Marital status: Married    Spouse name: Not on file  . Number of children: 2  . Years of education: some college  . Highest education level: Not on file  Occupational History  . Occupation: retired    Comment: [employer]  Tobacco Use  . Smoking status: Never Smoker  . Smokeless tobacco: Never Used  Substance and Sexual Activity  . Alcohol use: No  . Drug use: No  . Sexual activity: Not on file  Other Topics Concern  . Not on file  Social History Narrative  . Not on file   Social Determinants of Health   Financial Resource Strain:   . Difficulty of Paying Living Expenses: Not on file  Food Insecurity:   . Worried About Charity fundraiser in the Last Year: Not on file  . Ran Out of Food in the Last Year: Not on file  Transportation Needs:   . Lack of Transportation (Medical): Not on file  . Lack of Transportation (Non-Medical): Not on file  Physical Activity:   . Days of Exercise per Week: Not on file  . Minutes of Exercise per Session: Not on file  Stress:   . Feeling of Stress : Not on file    Social Connections:   . Frequency of Communication with Friends and Family: Not on file  . Frequency of Social Gatherings with Friends and Family: Not on file  . Attends Religious Services: Not on file  . Active Member of Clubs or Organizations: Not on file  . Attends Archivist Meetings: Not on file  . Marital Status: Not on file  Intimate Partner Violence:   . Fear of Current or Ex-Partner: Not on file  . Emotionally Abused: Not on file  . Physically Abused: Not on file  . Sexually Abused: Not on file    Family History  Problem Relation Age of Onset  . Heart disease Mother   . Hypertension Mother   . Arrhythmia Mother   . Heart attack Mother   . Diabetes Mother   . Hypertension Father   . Parkinson's disease Father   . Coronary artery disease Maternal Uncle   . Heart attack Maternal Uncle   . Diabetes Sister   . Diabetes Brother   .  Heart disease Brother   . Breast cancer Neg Hx      Current Outpatient Medications:  .  acetaminophen (TYLENOL) 500 MG tablet, Take 500 mg by mouth every 6 (six) hours as needed., Disp: , Rfl:  .  albuterol (PROVENTIL HFA;VENTOLIN HFA) 108 (90 Base) MCG/ACT inhaler, Inhale 2 puffs into the lungs every 6 (six) hours as needed for wheezing or shortness of breath., Disp: , Rfl:  .  carvedilol (COREG) 12.5 MG tablet, Take 12.5 mg by mouth 2 (two) times daily. , Disp: , Rfl:  .  cholecalciferol (VITAMIN D) 1000 units tablet, Take 1,000 Units by mouth daily., Disp: , Rfl:  .  dexamethasone (DECADRON) 4 MG tablet, Take 2 tablets (8 mg total) by mouth 2 (two) times daily. Start the day before Taxotere. Take once the day after, then 2 times a day x 2d., Disp: 30 tablet, Rfl: 1 .  famotidine (PEPCID) 20 MG tablet, Take 20 mg by mouth daily., Disp: , Rfl:  .  Fluticasone-Salmeterol (ADVAIR) 250-50 MCG/DOSE AEPB, Inhale 1 puff into the lungs 2 (two) times daily. , Disp: , Rfl:  .  gabapentin (NEURONTIN) 100 MG capsule, Take 200 mg by mouth 2  (two) times daily., Disp: , Rfl:  .  lidocaine-prilocaine (EMLA) cream, Apply to affected area once (Patient taking differently: Apply 1 application topically once. Apply to affected area once), Disp: 30 g, Rfl: 3 .  loperamide (IMODIUM) 2 MG capsule, Take 2 mg by mouth as needed for diarrhea or loose stools., Disp: , Rfl:  .  loratadine (CLARITIN) 10 MG tablet, Take 10 mg by mouth daily as needed for allergies., Disp: , Rfl:  .  LORazepam (ATIVAN) 0.5 MG tablet, Take 1 tablet (0.5 mg total) by mouth every 6 (six) hours as needed (Nausea or vomiting)., Disp: 30 tablet, Rfl: 0 .  metFORMIN (GLUCOPHAGE) 500 MG tablet, Take 500 mg by mouth daily. , Disp: , Rfl:  .  ondansetron (ZOFRAN) 8 MG tablet, Take 1 tablet (8 mg total) by mouth 2 (two) times daily as needed (Nausea or vomiting). Begin 4 days after chemotherapy., Disp: 30 tablet, Rfl: 1 .  prochlorperazine (COMPAZINE) 10 MG tablet, Take 1 tablet (10 mg total) by mouth every 6 (six) hours as needed (Nausea or vomiting)., Disp: 30 tablet, Rfl: 1 .  telmisartan-hydrochlorothiazide (MICARDIS HCT) 80-25 MG tablet, Take 1 tablet by mouth daily. , Disp: , Rfl:  .  diphenoxylate-atropine (LOMOTIL) 2.5-0.025 MG tablet, Take 1 tablet by mouth 4 (four) times daily as needed for diarrhea or loose stools. (Patient not taking: Reported on 12/22/2019), Disp: 60 tablet, Rfl: 0 .  potassium chloride (KLOR-CON) 20 MEQ packet, Take 20 mEq by mouth daily., Disp: 30 packet, Rfl: 0 .  potassium chloride SA (KLOR-CON) 20 MEQ tablet, Take 1 tablet (20 mEq total) by mouth daily. (Patient not taking: Reported on 12/22/2019), Disp: 30 tablet, Rfl: 1 .  solifenacin (VESICARE) 10 MG tablet, Take 10 mg by mouth daily. , Disp: , Rfl:   Physical exam:  Vitals:   12/22/19 0850  BP: 102/60  Pulse: 66  Resp: 16  Temp: (!) 97.2 F (36.2 C)  TempSrc: Tympanic  Weight: 195 lb 1.6 oz (88.5 kg)   Physical Exam Constitutional:      Comments: Appears fatigued  HENT:     Head:  Normocephalic and atraumatic.  Eyes:     Pupils: Pupils are equal, round, and reactive to light.  Cardiovascular:     Rate and Rhythm: Normal  rate and regular rhythm.     Heart sounds: Normal heart sounds.  Pulmonary:     Effort: Pulmonary effort is normal.     Breath sounds: Normal breath sounds.  Abdominal:     General: Bowel sounds are normal.     Palpations: Abdomen is soft.  Musculoskeletal:     Cervical back: Normal range of motion.  Skin:    General: Skin is warm and dry.  Neurological:     Mental Status: She is alert and oriented to person, place, and time.      CMP Latest Ref Rng & Units 12/22/2019  Glucose 70 - 99 mg/dL 105(H)  BUN 8 - 23 mg/dL 28(H)  Creatinine 0.44 - 1.00 mg/dL 1.21(H)  Sodium 135 - 145 mmol/L 138  Potassium 3.5 - 5.1 mmol/L 3.1(L)  Chloride 98 - 111 mmol/L 103  CO2 22 - 32 mmol/L 27  Calcium 8.9 - 10.3 mg/dL 9.3  Total Protein 6.5 - 8.1 g/dL 6.5  Total Bilirubin 0.3 - 1.2 mg/dL 0.6  Alkaline Phos 38 - 126 U/L 78  AST 15 - 41 U/L 33  ALT 0 - 44 U/L 16   CBC Latest Ref Rng & Units 12/22/2019  WBC 4.0 - 10.5 K/uL 3.9(L)  Hemoglobin 12.0 - 15.0 g/dL 9.4(L)  Hematocrit 36.0 - 46.0 % 31.2(L)  Platelets 150 - 400 K/uL 128(L)      Assessment and plan- Patient is a 64 y.o. female invasive mammary carcinoma of the right breast clinical prognostic stage IamcT1 ccN0 cM0 ER/PR positive HER-2/neu positive.  She is here for on treatment assessment prior to cycle 3 of neoadjuvant TCHP chemotherapy  Patient's white count is 3.9 today with an ANC of 1.5. She has received Neulasta for the first 2 TCHP treatments but despite that her neutrophil counts have been trending down.  I will hold off on giving her chemotherapy today and postpone it by 1 week.  I will reduce the dose of docetaxel to 60 mg per metered squared.  She will directly proceed for cycle 3 of TCHP chemotherapy next week with ongoing Neulasta support.  I will schedule her for weekly IV  fluids.  Chemo-induced thrombocytopenia: Continue to monitor  Chemo-induced anemia: Iron studies B12 and folate were done today and they are within normal limits.  Chemotherapy likely the cause of her anemia.  Continue to monitor  I will see her back in 4 weeks time with CBC with differential, CMP for cycle 4 of neoadjuvant TCHP chemotherapy.  I plan to get interim ultrasound after 3 treatments   Visit Diagnosis 1. Invasive carcinoma of breast ([place])   2. Chemotherapy induced neutropenia (HCC)   3. Encounter for antineoplastic chemotherapy   4. Encounter for monoclonal antibody treatment for malignancy   5. Chemotherapy-induced thrombocytopenia   6. Antineoplastic chemotherapy induced anemia      Dr. Randa Evens, MD, MPH [facility] [phone] 12/23/2019 8:31 AM

## 2019-12-28 ENCOUNTER — Other Ambulatory Visit: Payer: Self-pay

## 2019-12-29 ENCOUNTER — Other Ambulatory Visit: Payer: Self-pay | Admitting: Oncology

## 2019-12-29 ENCOUNTER — Other Ambulatory Visit: Payer: Self-pay

## 2019-12-29 ENCOUNTER — Inpatient Hospital Stay: Payer: Medicare Other | Attending: Oncology

## 2019-12-29 ENCOUNTER — Inpatient Hospital Stay: Payer: Medicare Other

## 2019-12-29 ENCOUNTER — Other Ambulatory Visit: Payer: Self-pay | Admitting: *Deleted

## 2019-12-29 VITALS — BP 128/77 | HR 70 | Temp 97.4°F | Resp 18 | Wt 199.0 lb

## 2019-12-29 DIAGNOSIS — Z5112 Encounter for antineoplastic immunotherapy: Secondary | ICD-10-CM | POA: Diagnosis not present

## 2019-12-29 DIAGNOSIS — C50919 Malignant neoplasm of unspecified site of unspecified female breast: Secondary | ICD-10-CM | POA: Diagnosis present

## 2019-12-29 DIAGNOSIS — Z5111 Encounter for antineoplastic chemotherapy: Secondary | ICD-10-CM | POA: Insufficient documentation

## 2019-12-29 DIAGNOSIS — Z5189 Encounter for other specified aftercare: Secondary | ICD-10-CM | POA: Diagnosis not present

## 2019-12-29 LAB — CBC WITH DIFFERENTIAL/PLATELET
Abs Immature Granulocytes: 0.01 10*3/uL (ref 0.00–0.07)
Basophils Absolute: 0.1 10*3/uL (ref 0.0–0.1)
Basophils Relative: 1 %
Eosinophils Absolute: 0.1 10*3/uL (ref 0.0–0.5)
Eosinophils Relative: 2 %
HCT: 31 % — ABNORMAL LOW (ref 36.0–46.0)
Hemoglobin: 9.1 g/dL — ABNORMAL LOW (ref 12.0–15.0)
Immature Granulocytes: 0 %
Lymphocytes Relative: 49 %
Lymphs Abs: 1.9 10*3/uL (ref 0.7–4.0)
MCH: 25.1 pg — ABNORMAL LOW (ref 26.0–34.0)
MCHC: 29.4 g/dL — ABNORMAL LOW (ref 30.0–36.0)
MCV: 85.6 fL (ref 80.0–100.0)
Monocytes Absolute: 0.5 10*3/uL (ref 0.1–1.0)
Monocytes Relative: 14 %
Neutro Abs: 1.3 10*3/uL — ABNORMAL LOW (ref 1.7–7.7)
Neutrophils Relative %: 34 %
Platelets: 217 10*3/uL (ref 150–400)
RBC: 3.62 MIL/uL — ABNORMAL LOW (ref 3.87–5.11)
RDW: 18.7 % — ABNORMAL HIGH (ref 11.5–15.5)
WBC: 3.9 10*3/uL — ABNORMAL LOW (ref 4.0–10.5)
nRBC: 0 % (ref 0.0–0.2)

## 2019-12-29 LAB — COMPREHENSIVE METABOLIC PANEL
ALT: 16 U/L (ref 0–44)
AST: 20 U/L (ref 15–41)
Albumin: 3.7 g/dL (ref 3.5–5.0)
Alkaline Phosphatase: 81 U/L (ref 38–126)
Anion gap: 9 (ref 5–15)
BUN: 25 mg/dL — ABNORMAL HIGH (ref 8–23)
CO2: 26 mmol/L (ref 22–32)
Calcium: 9.1 mg/dL (ref 8.9–10.3)
Chloride: 103 mmol/L (ref 98–111)
Creatinine, Ser: 1.25 mg/dL — ABNORMAL HIGH (ref 0.44–1.00)
GFR calc Af Amer: 52 mL/min — ABNORMAL LOW (ref >60)
GFR calc non Af Amer: 45 mL/min — ABNORMAL LOW (ref >60)
Glucose, Bld: 98 mg/dL (ref 70–99)
Potassium: 3.7 mmol/L (ref 3.5–5.1)
Sodium: 138 mmol/L (ref 135–145)
Total Bilirubin: 0.5 mg/dL (ref 0.3–1.2)
Total Protein: 6.5 g/dL (ref 6.5–8.1)

## 2019-12-29 MED ORDER — SODIUM CHLORIDE 0.9 % IV SOLN
Freq: Once | INTRAVENOUS | Status: AC
  Filled 2019-12-29: qty 250

## 2019-12-29 MED ORDER — SODIUM CHLORIDE 0.9 % IV SOLN
150.0000 mg | Freq: Once | INTRAVENOUS | Status: AC
  Administered 2019-12-29: 150 mg via INTRAVENOUS
  Filled 2019-12-29: qty 5

## 2019-12-29 MED ORDER — SODIUM CHLORIDE 0.9 % IV SOLN
60.0000 mg/m2 | Freq: Once | INTRAVENOUS | Status: AC
  Administered 2019-12-29: 120 mg via INTRAVENOUS
  Filled 2019-12-29: qty 12

## 2019-12-29 MED ORDER — SODIUM CHLORIDE 0.9 % IV SOLN
462.0000 mg | Freq: Once | INTRAVENOUS | Status: AC
  Administered 2019-12-29: 460 mg via INTRAVENOUS
  Filled 2019-12-29: qty 46

## 2019-12-29 MED ORDER — PALONOSETRON HCL INJECTION 0.25 MG/5ML
0.2500 mg | Freq: Once | INTRAVENOUS | Status: AC
  Administered 2019-12-29: 0.25 mg via INTRAVENOUS
  Filled 2019-12-29: qty 5

## 2019-12-29 MED ORDER — SODIUM CHLORIDE 0.9 % IV SOLN
10.0000 mg | Freq: Once | INTRAVENOUS | Status: AC
  Administered 2019-12-29: 10 mg via INTRAVENOUS
  Filled 2019-12-29: qty 1

## 2019-12-29 MED ORDER — FAMOTIDINE 20 MG PO TABS: 20.0000 mg | ORAL_TABLET | Freq: Every day | ORAL | 2 refills | Status: DC

## 2019-12-29 MED ORDER — TRASTUZUMAB-DKST CHEMO 150 MG IV SOLR
540.0000 mg | Freq: Once | INTRAVENOUS | Status: AC
  Administered 2019-12-29: 540 mg via INTRAVENOUS
  Filled 2019-12-29: qty 25.71

## 2019-12-29 MED ORDER — HEPARIN SOD (PORK) LOCK FLUSH 100 UNIT/ML IV SOLN
500.0000 [IU] | Freq: Once | INTRAVENOUS | Status: AC | PRN
  Administered 2019-12-29: 500 [IU]
  Filled 2019-12-29: qty 5

## 2019-12-29 MED ORDER — SODIUM CHLORIDE 0.9 % IV SOLN
420.0000 mg | Freq: Once | INTRAVENOUS | Status: AC
  Administered 2019-12-29: 420 mg via INTRAVENOUS
  Filled 2019-12-29: qty 14

## 2019-12-29 MED ORDER — ACETAMINOPHEN 325 MG PO TABS
650.0000 mg | ORAL_TABLET | Freq: Once | ORAL | Status: AC
  Administered 2019-12-29: 650 mg via ORAL
  Filled 2019-12-29: qty 2

## 2019-12-29 MED ORDER — PEGFILGRASTIM 6 MG/0.6ML ~~LOC~~ PSKT
6.0000 mg | PREFILLED_SYRINGE | Freq: Once | SUBCUTANEOUS | Status: AC
  Administered 2019-12-29: 6 mg via SUBCUTANEOUS
  Filled 2019-12-29: qty 0.6

## 2019-12-29 MED ORDER — DIPHENHYDRAMINE HCL 25 MG PO CAPS
50.0000 mg | ORAL_CAPSULE | Freq: Once | ORAL | Status: AC
  Administered 2019-12-29: 50 mg via ORAL
  Filled 2019-12-29: qty 2

## 2019-12-29 NOTE — Progress Notes (Signed)
Per MD, Taxotere dose decreased. Herceptin dose adjusted down for weight, Carboplatin dose decrease due to SCr and weight.

## 2019-12-29 NOTE — Progress Notes (Signed)
ANC 1.3 ok to treat per MD

## 2019-12-30 ENCOUNTER — Telehealth: Payer: Self-pay | Admitting: *Deleted

## 2019-12-30 ENCOUNTER — Inpatient Hospital Stay: Payer: Medicare Other

## 2019-12-30 ENCOUNTER — Other Ambulatory Visit: Payer: Self-pay

## 2019-12-30 DIAGNOSIS — D702 Other drug-induced agranulocytosis: Secondary | ICD-10-CM

## 2019-12-30 DIAGNOSIS — Z5112 Encounter for antineoplastic immunotherapy: Secondary | ICD-10-CM | POA: Diagnosis not present

## 2019-12-30 MED ORDER — PEGFILGRASTIM INJECTION 6 MG/0.6ML ~~LOC~~
6.0000 mg | PREFILLED_SYRINGE | Freq: Once | SUBCUTANEOUS | Status: AC
  Administered 2019-12-30: 15:00:00 6 mg via SUBCUTANEOUS
  Filled 2019-12-30: qty 0.6

## 2019-12-30 NOTE — Telephone Encounter (Signed)
Pt called and left message on voicemail that this morning about 3 am the onpro device started beeping and it would not stop and she took it off. I spoke to Tomasa Rand and she needs to bring the device back so we can send it to the company, she will need to get inj. Today. I have spoke to the above party of people and I called the lab and told Josh about this pt. Also so that brittany will get the device.

## 2020-01-05 ENCOUNTER — Inpatient Hospital Stay: Payer: Medicare Other

## 2020-01-05 ENCOUNTER — Other Ambulatory Visit: Payer: Self-pay

## 2020-01-05 VITALS — BP 137/64 | HR 72 | Temp 97.1°F | Resp 18

## 2020-01-05 DIAGNOSIS — Z95828 Presence of other vascular implants and grafts: Secondary | ICD-10-CM

## 2020-01-05 DIAGNOSIS — E860 Dehydration: Secondary | ICD-10-CM

## 2020-01-05 DIAGNOSIS — Z5112 Encounter for antineoplastic immunotherapy: Secondary | ICD-10-CM | POA: Diagnosis not present

## 2020-01-05 MED ORDER — SODIUM CHLORIDE 0.9% FLUSH
10.0000 mL | INTRAVENOUS | Status: DC | PRN
  Administered 2020-01-05: 10 mL via INTRAVENOUS
  Filled 2020-01-05: qty 10

## 2020-01-05 MED ORDER — HEPARIN SOD (PORK) LOCK FLUSH 100 UNIT/ML IV SOLN
500.0000 [IU] | Freq: Once | INTRAVENOUS | Status: AC
  Administered 2020-01-05: 500 [IU] via INTRAVENOUS
  Filled 2020-01-05: qty 5

## 2020-01-05 MED ORDER — SODIUM CHLORIDE 0.9 % IV SOLN
Freq: Once | INTRAVENOUS | Status: AC
  Filled 2020-01-05: qty 250

## 2020-01-09 ENCOUNTER — Telehealth: Payer: Self-pay | Admitting: *Deleted

## 2020-01-09 DIAGNOSIS — K219 Gastro-esophageal reflux disease without esophagitis: Secondary | ICD-10-CM

## 2020-01-09 DIAGNOSIS — C50919 Malignant neoplasm of unspecified site of unspecified female breast: Secondary | ICD-10-CM

## 2020-01-09 MED ORDER — PANTOPRAZOLE SODIUM 20 MG PO TBEC: 20.0000 mg | DELAYED_RELEASE_TABLET | Freq: Two times a day (BID) | ORAL | 2 refills | Status: DC

## 2020-01-09 NOTE — Telephone Encounter (Signed)
Pt called stating that she cont. To have reflux sx. She has tried the pepcid that she [place] to take and it is not helping. She feels that she has ulcer. Spoke to [place] and she states that we can give her protonix 20 mg bid before meals. Also we can send gi ref. And pt agreeable and chooses [facility]. I sent ref. And told pt that Gi usually calls in 3 days to give an appt for the future. She is ok with this. Pt was told to stop pepcid when she starts protonix

## 2020-01-10 ENCOUNTER — Ambulatory Visit
Admission: RE | Admit: 2020-01-10 | Discharge: 2020-01-10 | Disposition: A | Discharge status: Home / Self Care | Payer: Medicare Other | Source: Ambulatory Visit | Attending: Oncology | Admitting: Oncology

## 2020-01-10 DIAGNOSIS — C50919 Malignant neoplasm of unspecified site of unspecified female breast: Secondary | ICD-10-CM | POA: Diagnosis present

## 2020-01-11 ENCOUNTER — Other Ambulatory Visit: Payer: Self-pay

## 2020-01-12 ENCOUNTER — Other Ambulatory Visit: Payer: Self-pay

## 2020-01-12 ENCOUNTER — Inpatient Hospital Stay: Payer: Medicare Other

## 2020-01-12 ENCOUNTER — Other Ambulatory Visit: Payer: Self-pay | Admitting: *Deleted

## 2020-01-12 VITALS — BP 127/71 | HR 77 | Temp 97.3°F | Resp 18

## 2020-01-12 DIAGNOSIS — K219 Gastro-esophageal reflux disease without esophagitis: Secondary | ICD-10-CM

## 2020-01-12 DIAGNOSIS — Z5112 Encounter for antineoplastic immunotherapy: Secondary | ICD-10-CM | POA: Diagnosis not present

## 2020-01-12 DIAGNOSIS — C50919 Malignant neoplasm of unspecified site of unspecified female breast: Secondary | ICD-10-CM

## 2020-01-12 DIAGNOSIS — E860 Dehydration: Secondary | ICD-10-CM

## 2020-01-12 MED ORDER — HEPARIN SOD (PORK) LOCK FLUSH 100 UNIT/ML IV SOLN
INTRAVENOUS | Status: AC
  Filled 2020-01-12: qty 5

## 2020-01-12 MED ORDER — SODIUM CHLORIDE 0.9 % IV SOLN
INTRAVENOUS | Status: DC
  Filled 2020-01-12 (×2): qty 250

## 2020-01-12 MED ORDER — SODIUM CHLORIDE 0.9% FLUSH
10.0000 mL | Freq: Once | INTRAVENOUS | Status: AC
  Administered 2020-01-12: 10 mL via INTRAVENOUS
  Filled 2020-01-12: qty 10

## 2020-01-12 MED ORDER — HEPARIN SOD (PORK) LOCK FLUSH 100 UNIT/ML IV SOLN
500.0000 [IU] | Freq: Once | INTRAVENOUS | Status: AC
  Administered 2020-01-12: 500 [IU]
  Filled 2020-01-12: qty 5

## 2020-01-13 ENCOUNTER — Other Ambulatory Visit: Payer: Self-pay

## 2020-01-13 ENCOUNTER — Ambulatory Visit (INDEPENDENT_AMBULATORY_CARE_PROVIDER_SITE_OTHER): Payer: Medicare Other | Admitting: Gastroenterology

## 2020-01-13 ENCOUNTER — Encounter: Payer: Self-pay | Admitting: Gastroenterology

## 2020-01-13 DIAGNOSIS — R1010 Upper abdominal pain, unspecified: Secondary | ICD-10-CM | POA: Diagnosis not present

## 2020-01-13 DIAGNOSIS — R1013 Epigastric pain: Secondary | ICD-10-CM

## 2020-01-13 NOTE — Progress Notes (Signed)
Christina Sear, MD [address]  [place]  [place], [place] [address]  Main: [phone]  Fax: [phone]    Gastroenterology Consultation Video Visit  Referring Provider:     Sindy Guadeloupe, MD Primary Care Physician:  Kirk Ruths, MD Primary Gastroenterologist:  Dr. Vira Agar Reason for Consultation: Epigastric pain        HPI:   Christina Mcclain is a 64 y.o. female referred by Dr. Ouida Sills, Ocie Cornfield, MD  for consultation & management of epigastric pain  Virtual Visit Video Note  I connected with Christina Mcclain on 01/13/20 at  9:30 AM EST by video and verified that I am speaking with the correct person using two identifiers.   I discussed the limitations, risks, security and privacy concerns of performing an evaluation and management service by video and the availability of in person appointments. I also discussed with the patient that there may be a patient responsible charge related to this service. The patient expressed understanding and agreed to proceed.  Location of the Patient: Home  Location of the provider: Home office  Persons participating in the visit: Patient and provider   History of Present Illness: Christina Mcclain is a 64 year old female with history of diabetes, invasive mammary carcinoma of the right breast stage IamcT1 ccN0 cM0 ER/PR positive HER-2/neu positive currently on neoadjuvant chemo is referred for evaluation of epigastric pain.  Patient went to ER on 12/17/2019 secondary to dehydration from diarrhea that has started since initiation of chemotherapy.  She was also concerned about epigastric pain.  Patient is started on Pepcid in the ER however, she noticed temporary relief.  Subsequently, was seen by Dr. Janese Banks who started her on Protonix 20 mg twice daily before meals.  Patient reports that she noted significant relief in her epigastric pain, however worried if she has any ulcer.  She does report intermittent nausea and bloating,  indigestion.  She denies any black stools or early satiety.  She has diarrhea secondary to chemo leading to dehydration has resulted in rising creatinine.  She is taking Imodium as needed for diarrhea    NSAIDs: None  Antiplts/Anticoagulants/Anti thrombotics: None  GI Procedures:  Colonoscopy by Dr. Vira Agar - One small polyp in the ascending colon, removed using injection-lift and a hot snare. Resected and retrieved. - Three diminutive polyps in the transverse colon, removed with a jumbo cold forceps. Resected and retrieved. - Internal hemorrhoids. - The examination was otherwise normal.  DIAGNOSIS:  A. COLON POLYP, PROXIMAL ASCENDING; HOT SNARE:  - TUBULAR ADENOMA.  - NEGATIVE FOR HIGH-GRADE DYSPLASIA AND MALIGNANCY.   B. COLON POLYP 2, TRANSVERSE; COLD BIOPSY:  - POLYPOID COLONIC MUCOSA NEGATIVE FOR DYSPLASIA AND MALIGNANCY (2).   C. COLON POLYP, TRANSVERSE; COLD BIOPSY:  - TUBULAR ADENOMA.  - NEGATIVE FOR HIGH-GRADE DYSPLASIA AND MALIGNANCY.   Past Medical History:  Diagnosis Date  . Adverse effect of anesthesia    hard to awaken  . Anemia   . Arthritis   . Asthma   . Asthma without status asthmaticus    unspecified; Take advair each night  . Breast cancer ([place])   . Bronchitis   . Constipation   . Diabetes ([place]) 2010  . Diabetes mellitus type 2, uncomplicated ([place])   . Diabetes mellitus without complication ([place])   . Encounter for blood transfusion    hysterectomy 9-10 years ago  . Heart murmur    unspecified  . Hyperlipidemia   . Hypertension 1991  . Hypothyroid   .  Multiple thyroid nodules   . Neuromuscular disorder (HCC)    neuropathy  . Osteoarthritis of right hip 02/04/2017  . Palpitations   . Pernicious anemia   . Pneumonia   . Sleep apnea    Use C-PAP; can't use mouth piece. Anxiety  . Thyroid disease    nodules  . Thyroid nodule    x2  . Urinary urgency     Past Surgical History:  Procedure Laterality Date  . ABDOMINAL HYSTERECTOMY   2011   partial  . ABDOMINAL SURGERY    . CARPAL TUNNEL RELEASE Left 2008  . CARPAL TUNNEL RELEASE Right   . CHOLECYSTECTOMY  1982  . COLONOSCOPY WITH PROPOFOL N/A 10/05/2017   Procedure: COLONOSCOPY WITH PROPOFOL;  Surgeon: Manya Silvas, MD;  Location: [facility] ENDOSCOPY;  Service: Endoscopy;  Laterality: N/A;  . DIAGNOSTIC LAPAROSCOPY    . JOINT REPLACEMENT Right 02/09/2017   TOTAL HIP REPLACEMENT, DR. CARTER. [place]  . KNEE ARTHROSCOPY  1990s  . PORTACATH PLACEMENT Left 11/07/2019   Procedure: INSERTION PORT-A-CATH;  Surgeon: Herbert Pun, MD;  Location: [facility] ORS;  Service: General;  Laterality: Left;  . TOTAL HIP ARTHROPLASTY Left 06/23/2017   Procedure: TOTAL HIP ARTHROPLASTY ANTERIOR APPROACH;  Surgeon: Hessie Knows, MD;  Location: [facility] ORS;  Service: Orthopedics;  Laterality: Left;    Current Outpatient Medications:  .  acetaminophen (TYLENOL) 500 MG tablet, Take 500 mg by mouth every 6 (six) hours as needed., Disp: , Rfl:  .  albuterol (PROVENTIL HFA;VENTOLIN HFA) 108 (90 Base) MCG/ACT inhaler, Inhale 2 puffs into the lungs every 6 (six) hours as needed for wheezing or shortness of breath., Disp: , Rfl:  .  carvedilol (COREG) 12.5 MG tablet, Take 12.5 mg by mouth 2 (two) times daily. , Disp: , Rfl:  .  cholecalciferol (VITAMIN D) 1000 units tablet, Take 1,000 Units by mouth daily., Disp: , Rfl:  .  diphenoxylate-atropine (LOMOTIL) 2.5-0.025 MG tablet, Take 1 tablet by mouth 4 (four) times daily as needed for diarrhea or loose stools., Disp: 60 tablet, Rfl: 0 .  Fluticasone-Salmeterol (ADVAIR) 250-50 MCG/DOSE AEPB, Inhale 1 puff into the lungs 2 (two) times daily. , Disp: , Rfl:  .  gabapentin (NEURONTIN) 100 MG capsule, Take 200 mg by mouth 2 (two) times daily., Disp: , Rfl:  .  lidocaine-prilocaine (EMLA) cream, Apply to affected area once (Patient taking differently: Apply 1 application topically once. Apply to affected area once), Disp: 30 g, Rfl: 3 .  loperamide  (IMODIUM) 2 MG capsule, Take 2 mg by mouth as needed for diarrhea or loose stools., Disp: , Rfl:  .  loratadine (CLARITIN) 10 MG tablet, Take 10 mg by mouth daily as needed for allergies., Disp: , Rfl:  .  LORazepam (ATIVAN) 0.5 MG tablet, Take 1 tablet (0.5 mg total) by mouth every 6 (six) hours as needed (Nausea or vomiting)., Disp: 30 tablet, Rfl: 0 .  metFORMIN (GLUCOPHAGE-XR) 500 MG 24 hr tablet, Take 500 mg by mouth daily., Disp: , Rfl:  .  ondansetron (ZOFRAN) 8 MG tablet, Take 1 tablet (8 mg total) by mouth 2 (two) times daily as needed (Nausea or vomiting). Begin 4 days after chemotherapy., Disp: 30 tablet, Rfl: 1 .  pantoprazole (PROTONIX) 20 MG tablet, Take 1 tablet (20 mg total) by mouth 2 (two) times daily before a meal., Disp: 60 tablet, Rfl: 2 .  potassium chloride (KLOR-CON) 20 MEQ packet, Take 20 mEq by mouth daily., Disp: 30 packet, Rfl: 0 .  prochlorperazine (COMPAZINE)  10 MG tablet, Take 1 tablet (10 mg total) by mouth every 6 (six) hours as needed (Nausea or vomiting)., Disp: 30 tablet, Rfl: 1 .  solifenacin (VESICARE) 10 MG tablet, Take 10 mg by mouth daily. , Disp: , Rfl:  .  telmisartan-hydrochlorothiazide (MICARDIS HCT) 80-25 MG tablet, Take 1 tablet by mouth daily. , Disp: , Rfl:  .  triamcinolone cream (KENALOG) 0.5 %, APP EXT AA BID, Disp: , Rfl:  No current facility-administered medications for this visit.  Facility-Administered Medications Ordered in Other Visits:  .  sodium chloride flush (NS) 0.9 % injection 10 mL, 10 mL, Intravenous, PRN, Sindy Guadeloupe, MD, 10 mL at 01/05/20 1344   Family History  Problem Relation Age of Onset  . Heart disease Mother   . Hypertension Mother   . Arrhythmia Mother   . Heart attack Mother   . Diabetes Mother   . Hypertension Father   . Parkinson's disease Father   . Coronary artery disease Maternal Uncle   . Heart attack Maternal Uncle   . Diabetes Sister   . Diabetes Brother   . Heart disease Brother   . Breast cancer  Neg Hx      Social History   Tobacco Use  . Smoking status: Never Smoker  . Smokeless tobacco: Never Used  Substance Use Topics  . Alcohol use: No  . Drug use: No    Allergies as of 01/13/2020 - Review Complete 01/13/2020  Allergen Reaction Noted  . Statins  12/18/2017    Imaging Studies: No abdominal imaging  Assessment and Plan:   Maggi Hershkowitz is a 64 y.o. female with diabetes, breast cancer currently on neoadjuvant chemo is seen in consultation for approximately 1 month history of dyspepsia.  Differentials include chemotherapy-induced gastroparesis or peptic ulcer disease or chemotherapy-induced gastritis or H. pylori gastritis  Continue Protonix 20 mg twice daily before meals I reassured patient it is safe to take Protonix 20 mg 2 times a day while she is on the chemo to help alleviate her symptoms and decreased p.o. intake from pain epigastric pain Recommend EGD for further evaluation and patient is agreeable  I have discussed alternative options, risks & benefits,  which include, but are not limited to, bleeding, infection, perforation,respiratory complication & drug reaction.  The patient agrees with this plan & written consent will be obtained.     Personal history of tubular adenomas of colon Recommend surveillance colonoscopy in 09/2022   Follow Up Instructions:   I discussed the assessment and treatment plan with the patient. The patient was provided an opportunity to ask questions and all were answered. The patient agreed with the plan and demonstrated an understanding of the instructions.   The patient was advised to call back or seek an in-person evaluation if the symptoms worsen or if the condition fails to improve as anticipated.  I provided 15 minutes of face-to-face time during this encounter.   Follow up after above work-up   Cephas Darby, MD

## 2020-01-13 NOTE — Addendum Note (Signed)
Addended by: Ulyess Blossom L on: 01/13/2020 11:35 AM   Modules accepted: Orders

## 2020-01-19 ENCOUNTER — Encounter: Payer: Self-pay | Admitting: Oncology

## 2020-01-19 ENCOUNTER — Inpatient Hospital Stay (HOSPITAL_BASED_OUTPATIENT_CLINIC_OR_DEPARTMENT_OTHER): Payer: Medicare Other | Admitting: Oncology

## 2020-01-19 ENCOUNTER — Inpatient Hospital Stay: Payer: Medicare Other

## 2020-01-19 ENCOUNTER — Other Ambulatory Visit: Payer: Self-pay

## 2020-01-19 VITALS — BP 136/69 | HR 78 | Temp 98.0°F | Resp 16 | Wt 197.7 lb

## 2020-01-19 DIAGNOSIS — Z79899 Other long term (current) drug therapy: Secondary | ICD-10-CM | POA: Diagnosis not present

## 2020-01-19 DIAGNOSIS — K521 Toxic gastroenteritis and colitis: Secondary | ICD-10-CM

## 2020-01-19 DIAGNOSIS — C50919 Malignant neoplasm of unspecified site of unspecified female breast: Secondary | ICD-10-CM

## 2020-01-19 DIAGNOSIS — Z5111 Encounter for antineoplastic chemotherapy: Secondary | ICD-10-CM | POA: Diagnosis not present

## 2020-01-19 DIAGNOSIS — T451X5A Adverse effect of antineoplastic and immunosuppressive drugs, initial encounter: Secondary | ICD-10-CM

## 2020-01-19 DIAGNOSIS — D6959 Other secondary thrombocytopenia: Secondary | ICD-10-CM

## 2020-01-19 DIAGNOSIS — Z5112 Encounter for antineoplastic immunotherapy: Secondary | ICD-10-CM | POA: Diagnosis not present

## 2020-01-19 LAB — CBC WITH DIFFERENTIAL/PLATELET
Abs Immature Granulocytes: 0.03 10*3/uL (ref 0.00–0.07)
Basophils Absolute: 0 10*3/uL (ref 0.0–0.1)
Basophils Relative: 0 %
Eosinophils Absolute: 0 10*3/uL (ref 0.0–0.5)
Eosinophils Relative: 0 %
HCT: 30.9 % — ABNORMAL LOW (ref 36.0–46.0)
Hemoglobin: 9 g/dL — ABNORMAL LOW (ref 12.0–15.0)
Immature Granulocytes: 1 %
Lymphocytes Relative: 18 %
Lymphs Abs: 1.1 10*3/uL (ref 0.7–4.0)
MCH: 25.1 pg — ABNORMAL LOW (ref 26.0–34.0)
MCHC: 29.1 g/dL — ABNORMAL LOW (ref 30.0–36.0)
MCV: 86.3 fL (ref 80.0–100.0)
Monocytes Absolute: 0.1 10*3/uL (ref 0.1–1.0)
Monocytes Relative: 1 %
Neutro Abs: 4.9 10*3/uL (ref 1.7–7.7)
Neutrophils Relative %: 80 %
Platelets: 125 10*3/uL — ABNORMAL LOW (ref 150–400)
RBC: 3.58 MIL/uL — ABNORMAL LOW (ref 3.87–5.11)
RDW: 19.6 % — ABNORMAL HIGH (ref 11.5–15.5)
WBC: 6.1 10*3/uL (ref 4.0–10.5)
nRBC: 0 % (ref 0.0–0.2)

## 2020-01-19 LAB — COMPREHENSIVE METABOLIC PANEL
ALT: 16 U/L (ref 0–44)
AST: 24 U/L (ref 15–41)
Albumin: 3.7 g/dL (ref 3.5–5.0)
Alkaline Phosphatase: 87 U/L (ref 38–126)
Anion gap: 9 (ref 5–15)
BUN: 22 mg/dL (ref 8–23)
CO2: 29 mmol/L (ref 22–32)
Calcium: 9.3 mg/dL (ref 8.9–10.3)
Chloride: 104 mmol/L (ref 98–111)
Creatinine, Ser: 1.23 mg/dL — ABNORMAL HIGH (ref 0.44–1.00)
GFR calc Af Amer: 53 mL/min — ABNORMAL LOW (ref >60)
GFR calc non Af Amer: 46 mL/min — ABNORMAL LOW (ref >60)
Glucose, Bld: 173 mg/dL — ABNORMAL HIGH (ref 70–99)
Potassium: 3.8 mmol/L (ref 3.5–5.1)
Sodium: 142 mmol/L (ref 135–145)
Total Bilirubin: 0.5 mg/dL (ref 0.3–1.2)
Total Protein: 7 g/dL (ref 6.5–8.1)

## 2020-01-19 MED ORDER — ACETAMINOPHEN 325 MG PO TABS
650.0000 mg | ORAL_TABLET | Freq: Once | ORAL | Status: AC
  Administered 2020-01-19: 10:00:00 650 mg via ORAL
  Filled 2020-01-19: qty 2

## 2020-01-19 MED ORDER — DIPHENHYDRAMINE HCL 25 MG PO CAPS
50.0000 mg | ORAL_CAPSULE | Freq: Once | ORAL | Status: AC
  Administered 2020-01-19: 10:00:00 50 mg via ORAL
  Filled 2020-01-19: qty 2

## 2020-01-19 MED ORDER — SODIUM CHLORIDE 0.9% FLUSH
10.0000 mL | INTRAVENOUS | Status: DC | PRN
  Administered 2020-01-19: 08:00:00 10 mL via INTRAVENOUS
  Filled 2020-01-19: qty 10

## 2020-01-19 MED ORDER — SODIUM CHLORIDE 0.9 % IV SOLN
420.0000 mg | Freq: Once | INTRAVENOUS | Status: AC
  Administered 2020-01-19: 12:00:00 420 mg via INTRAVENOUS
  Filled 2020-01-19: qty 14

## 2020-01-19 MED ORDER — PALONOSETRON HCL INJECTION 0.25 MG/5ML
0.2500 mg | Freq: Once | INTRAVENOUS | Status: AC
  Administered 2020-01-19: 10:00:00 0.25 mg via INTRAVENOUS
  Filled 2020-01-19: qty 5

## 2020-01-19 MED ORDER — SODIUM CHLORIDE 0.9 % IV SOLN
150.0000 mg | Freq: Once | INTRAVENOUS | Status: AC
  Administered 2020-01-19: 11:00:00 150 mg via INTRAVENOUS
  Filled 2020-01-19: qty 150

## 2020-01-19 MED ORDER — HEPARIN SOD (PORK) LOCK FLUSH 100 UNIT/ML IV SOLN
INTRAVENOUS | Status: AC
  Filled 2020-01-19: qty 5

## 2020-01-19 MED ORDER — SODIUM CHLORIDE 0.9 % IV SOLN
60.0000 mg/m2 | Freq: Once | INTRAVENOUS | Status: AC
  Administered 2020-01-19: 13:00:00 120 mg via INTRAVENOUS
  Filled 2020-01-19: qty 12

## 2020-01-19 MED ORDER — SODIUM CHLORIDE 0.9 % IV SOLN
460.0000 mg | Freq: Once | INTRAVENOUS | Status: AC
  Administered 2020-01-19: 14:00:00 460 mg via INTRAVENOUS
  Filled 2020-01-19: qty 46

## 2020-01-19 MED ORDER — SODIUM CHLORIDE 0.9 % IV SOLN
Freq: Once | INTRAVENOUS | Status: AC
  Filled 2020-01-19: qty 250

## 2020-01-19 MED ORDER — SODIUM CHLORIDE 0.9 % IV SOLN
10.0000 mg | Freq: Once | INTRAVENOUS | Status: AC
  Administered 2020-01-19: 10:00:00 10 mg via INTRAVENOUS
  Filled 2020-01-19: qty 10

## 2020-01-19 MED ORDER — HEPARIN SOD (PORK) LOCK FLUSH 100 UNIT/ML IV SOLN
500.0000 [IU] | Freq: Once | INTRAVENOUS | Status: AC
  Administered 2020-01-19: 14:00:00 500 [IU] via INTRAVENOUS
  Filled 2020-01-19: qty 5

## 2020-01-19 MED ORDER — TRASTUZUMAB-DKST CHEMO 150 MG IV SOLR
540.0000 mg | Freq: Once | INTRAVENOUS | Status: AC
  Administered 2020-01-19: 11:00:00 540 mg via INTRAVENOUS
  Filled 2020-01-19: qty 25.71

## 2020-01-19 NOTE — Progress Notes (Signed)
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 It showed 2 suspicious lesions in the right breast. Diagnostic mammogram and ultrasound confirmed 2 masses in the 9 o'clock position of the right breast measuring 1.1 x 1.2 x 1.2 cm and the other one measuring 0.8 x 0.8 x 0.9 cm. These 2 masses were 1.7 cm apart. Sonographic evaluation of the right axilla did not show any enlarged adenopathy. 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  Baseline MUGA scan showed EF of 58%. MRI showed two distinct masses in the right breast 1.5 cm and 1.2 cm 2 cm apart no additional suspicious masses or abnormal enhancement identified. No suspicious adenopathy   Interval history-diarrhea is sporadic and usually self-limited.  She requires Imodium at times.  Reports ongoing fatigue.  Patient has had bilateral leg swelling even before chemotherapy and feels that that is slowly coming up again.  She had significant issues with heartburn and we had started her on Protonix and referred her to  Dr. Marius Ditch.  She will be getting her EGD in 10 days  ECOG PS- 1 Pain scale- 0 Opioid associated constipation- no  Review of systems- Review of Systems  Constitutional: Positive for malaise/fatigue. Negative for chills, fever and weight loss.  HENT: Negative for congestion, ear discharge and nosebleeds.   Eyes: Negative for blurred vision.  Respiratory: Negative for cough, hemoptysis, sputum production, shortness of breath and wheezing.   Cardiovascular: Positive for leg swelling. Negative for chest pain, palpitations, orthopnea, claudication and PND.  Gastrointestinal: Positive for diarrhea and heartburn. Negative for abdominal pain, blood in stool, constipation, melena, nausea and vomiting.  Genitourinary: Negative for dysuria, flank pain, frequency, hematuria and urgency.  Musculoskeletal: Negative for back pain, joint pain and myalgias.  Skin: Negative for rash.  Neurological: Negative for dizziness, tingling, focal weakness, seizures, weakness and headaches.  Endo/Heme/Allergies: Does not bruise/bleed easily.  Psychiatric/Behavioral: Negative for depression and suicidal ideas. The patient does not have insomnia.       Allergies  Allergen Reactions  . Statins     Other reaction(s): Muscle Pain Possible hives also     Past Medical History:  Diagnosis Date  . Adverse effect of anesthesia    hard to awaken  . Anemia   . Arthritis   . Asthma   . Asthma without status asthmaticus    unspecified; Take advair each night  . Breast cancer ([place])   . Bronchitis   . Constipation   . Diabetes ([place]) 2010  . Diabetes mellitus type 2, uncomplicated ([place])   . Diabetes mellitus without complication ([place])   . Encounter for blood transfusion    hysterectomy 9-10 years ago  .  Heart murmur    unspecified  . Hyperlipidemia   . Hypertension 1991  . Hypothyroid   . Multiple thyroid nodules   . Neuromuscular disorder ([facility])    neuropathy  . Osteoarthritis of right hip 02/04/2017  .  Palpitations   . Pernicious anemia   . Pneumonia   . Sleep apnea    Use C-PAP; can't use mouth piece. Anxiety  . Thyroid disease    nodules  . Thyroid nodule    x2  . Urinary urgency      Past Surgical History:  Procedure Laterality Date  . ABDOMINAL HYSTERECTOMY  2011   partial  . ABDOMINAL SURGERY    . CARPAL TUNNEL RELEASE Left 2008  . CARPAL TUNNEL RELEASE Right   . CHOLECYSTECTOMY  1982  . COLONOSCOPY WITH PROPOFOL N/A 10/05/2017   Procedure: COLONOSCOPY WITH PROPOFOL;  Surgeon: Manya Silvas, MD;  Location: [facility] ENDOSCOPY;  Service: Endoscopy;  Laterality: N/A;  . DIAGNOSTIC LAPAROSCOPY    . JOINT REPLACEMENT Right 02/09/2017   TOTAL HIP REPLACEMENT, DR. CARTER. [place]  . KNEE ARTHROSCOPY  1990s  . PORTACATH PLACEMENT Left 11/07/2019   Procedure: INSERTION PORT-A-CATH;  Surgeon: Herbert Pun, MD;  Location: [facility] ORS;  Service: General;  Laterality: Left;  . TOTAL HIP ARTHROPLASTY Left 06/23/2017   Procedure: TOTAL HIP ARTHROPLASTY ANTERIOR APPROACH;  Surgeon: Hessie Knows, MD;  Location: [facility] ORS;  Service: Orthopedics;  Laterality: Left;    Social History   Socioeconomic History  . Marital status: Married    Spouse name: Not on file  . Number of children: 2  . Years of education: some college  . Highest education level: Not on file  Occupational History  . Occupation: retired    Comment: [employer]  Tobacco Use  . Smoking status: Never Smoker  . Smokeless tobacco: Never Used  Substance and Sexual Activity  . Alcohol use: No  . Drug use: No  . Sexual activity: Not on file  Other Topics Concern  . Not on file  Social History Narrative  . Not on file   Social Determinants of Health   Financial Resource Strain:   . Difficulty of Paying Living Expenses: Not on file  Food Insecurity:   . Worried About Charity fundraiser in the Last Year: Not on file  . Ran Out of Food in the Last Year: Not on file  Transportation Needs:    . Lack of Transportation (Medical): Not on file  . Lack of Transportation (Non-Medical): Not on file  Physical Activity:   . Days of Exercise per Week: Not on file  . Minutes of Exercise per Session: Not on file  Stress:   . Feeling of Stress : Not on file  Social Connections:   . Frequency of Communication with Friends and Family: Not on file  . Frequency of Social Gatherings with Friends and Family: Not on file  . Attends Religious Services: Not on file  . Active Member of Clubs or Organizations: Not on file  . Attends Archivist Meetings: Not on file  . Marital Status: Not on file  Intimate Partner Violence:   . Fear of Current or Ex-Partner: Not on file  . Emotionally Abused: Not on file  . Physically Abused: Not on file  . Sexually Abused: Not on file    Family History  Problem Relation Age of Onset  . Heart disease Mother   . Hypertension Mother   . Arrhythmia Mother   .  Heart attack Mother   . Diabetes Mother   . Hypertension Father   . Parkinson's disease Father   . Coronary artery disease Maternal Uncle   . Heart attack Maternal Uncle   . Diabetes Sister   . Diabetes Brother   . Heart disease Brother   . Breast cancer Neg Hx      Current Outpatient Medications:  .  albuterol (PROVENTIL HFA;VENTOLIN HFA) 108 (90 Base) MCG/ACT inhaler, Inhale 2 puffs into the lungs every 6 (six) hours as needed for wheezing or shortness of breath., Disp: , Rfl:  .  carvedilol (COREG) 12.5 MG tablet, Take 12.5 mg by mouth 2 (two) times daily. , Disp: , Rfl:  .  cholecalciferol (VITAMIN D) 1000 units tablet, Take 1,000 Units by mouth daily., Disp: , Rfl:  .  diphenoxylate-atropine (LOMOTIL) 2.5-0.025 MG tablet, Take 1 tablet by mouth 4 (four) times daily as needed for diarrhea or loose stools., Disp: 60 tablet, Rfl: 0 .  Fluticasone-Salmeterol (ADVAIR) 250-50 MCG/DOSE AEPB, Inhale 1 puff into the lungs 2 (two) times daily. , Disp: , Rfl:  .  gabapentin (NEURONTIN) 100 MG  capsule, Take 200 mg by mouth 2 (two) times daily., Disp: , Rfl:  .  lidocaine-prilocaine (EMLA) cream, Apply to affected area once (Patient taking differently: Apply 1 application topically once. Apply to affected area once), Disp: 30 g, Rfl: 3 .  loperamide (IMODIUM) 2 MG capsule, Take 2 mg by mouth as needed for diarrhea or loose stools., Disp: , Rfl:  .  loratadine (CLARITIN) 10 MG tablet, Take 10 mg by mouth daily as needed for allergies., Disp: , Rfl:  .  LORazepam (ATIVAN) 0.5 MG tablet, Take 1 tablet (0.5 mg total) by mouth every 6 (six) hours as needed (Nausea or vomiting)., Disp: 30 tablet, Rfl: 0 .  metFORMIN (GLUCOPHAGE-XR) 500 MG 24 hr tablet, Take 500 mg by mouth daily., Disp: , Rfl:  .  ondansetron (ZOFRAN) 8 MG tablet, Take 1 tablet (8 mg total) by mouth 2 (two) times daily as needed (Nausea or vomiting). Begin 4 days after chemotherapy., Disp: 30 tablet, Rfl: 1 .  pantoprazole (PROTONIX) 20 MG tablet, Take 1 tablet (20 mg total) by mouth 2 (two) times daily before a meal., Disp: 60 tablet, Rfl: 2 .  potassium chloride (KLOR-CON) 20 MEQ packet, Take 20 mEq by mouth daily., Disp: 30 packet, Rfl: 0 .  solifenacin (VESICARE) 10 MG tablet, Take 10 mg by mouth daily. , Disp: , Rfl:  .  telmisartan-hydrochlorothiazide (MICARDIS HCT) 80-25 MG tablet, Take 1 tablet by mouth daily. , Disp: , Rfl:  .  triamcinolone cream (KENALOG) 0.5 %, Apply 1 application topically 2 (two) times daily. , Disp: , Rfl:  .  acetaminophen (TYLENOL) 500 MG tablet, Take 500 mg by mouth every 6 (six) hours as needed., Disp: , Rfl:  .  prochlorperazine (COMPAZINE) 10 MG tablet, Take 1 tablet (10 mg total) by mouth every 6 (six) hours as needed (Nausea or vomiting). (Patient not taking: Reported on 01/19/2020), Disp: 30 tablet, Rfl: 1 No current facility-administered medications for this visit.  Facility-Administered Medications Ordered in Other Visits:  .  CARBOplatin (PARAPLATIN) 460 mg in sodium chloride 0.9 % 250  mL chemo infusion, 460 mg, Intravenous, Once, Sindy Guadeloupe, MD .  DOCEtaxel (TAXOTERE) 120 mg in sodium chloride 0.9 % 250 mL chemo infusion, 60 mg/m2 (Treatment Plan Recorded), Intravenous, Once, Sindy Guadeloupe, MD, Last Rate: 262 mL/hr at 01/19/20 1233, 120 mg at 01/19/20 1233 .  heparin lock flush 100 unit/mL, 500 Units, Intravenous, Once, Randa Evens C, MD .  sodium chloride flush (NS) 0.9 % injection 10 mL, 10 mL, Intravenous, PRN, Sindy Guadeloupe, MD, 10 mL at 01/05/20 1344 .  sodium chloride flush (NS) 0.9 % injection 10 mL, 10 mL, Intravenous, PRN, Sindy Guadeloupe, MD, 10 mL at 01/19/20 0810  Physical exam:  Vitals:   01/19/20 0842  BP: 136/69  Pulse: 78  Resp: 16  Temp: 98 F (36.7 C)  TempSrc: Tympanic  Weight: 197 lb 11.2 oz (89.7 kg)   Physical Exam Constitutional:      General: She is not in acute distress. HENT:     Head: Normocephalic and atraumatic.  Eyes:     Pupils: Pupils are equal, round, and reactive to light.  Cardiovascular:     Rate and Rhythm: Normal rate and regular rhythm.     Heart sounds: Normal heart sounds.  Pulmonary:     Effort: Pulmonary effort is normal.     Breath sounds: Normal breath sounds.  Abdominal:     General: Bowel sounds are normal.     Palpations: Abdomen is soft.  Musculoskeletal:     Cervical back: Normal range of motion.     Comments: Bilateral +1 edema  Skin:    General: Skin is warm and dry.  Neurological:     Mental Status: She is alert and oriented to person, place, and time.      CMP Latest Ref Rng & Units 01/19/2020  Glucose 70 - 99 mg/dL 173(H)  BUN 8 - 23 mg/dL 22  Creatinine 0.44 - 1.00 mg/dL 1.23(H)  Sodium 135 - 145 mmol/L 142  Potassium 3.5 - 5.1 mmol/L 3.8  Chloride 98 - 111 mmol/L 104  CO2 22 - 32 mmol/L 29  Calcium 8.9 - 10.3 mg/dL 9.3  Total Protein 6.5 - 8.1 g/dL 7.0  Total Bilirubin 0.3 - 1.2 mg/dL 0.5  Alkaline Phos 38 - 126 U/L 87  AST 15 - 41 U/L 24  ALT 0 - 44 U/L 16   CBC Latest Ref Rng  & Units 01/19/2020  WBC 4.0 - 10.5 K/uL 6.1  Hemoglobin 12.0 - 15.0 g/dL 9.0(L)  Hematocrit 36.0 - 46.0 % 30.9(L)  Platelets 150 - 400 K/uL 125(L)    No images are attached to the encounter.  US Breast Limited Uni Right Inc Axilla  Result Date: 01/10/2020 CLINICAL DATA:  Patient returns after 3 treatments of chemotherapy for evaluation of 2 known sites of biopsy-proven malignancy in the LATERAL RIGHT breast. EXAM: ULTRASOUND OF THE RIGHT BREAST COMPARISON:  10/19/2019 FINDINGS: On physical exam, I palpate no abnormality in the LATERAL portion of the RIGHT breast. Targeted ultrasound is performed, showing small indistinct hypoechoic mass in the 9 o'clock location of the RIGHT breast 9 centimeters from the nipple, today measuring 0.8 x 0.4 x 0.9 centimeters. Previously this mass measured 0.8 x 0.8 centimeters. Second mass is identified in the 9 o'clock location 7 centimeters from the nipple measuring 1.0 x 0.6 x 0.7 centimeters. Previously this mass measured 1.1 x 1.2 x 1.2 centimeters. IMPRESSION: Interval decrease in size and conspicuity of 2 lesions in the o'clock location of the RIGHT breast. RECOMMENDATION: Treatment plan for known RIGHT breast malignancy. I have discussed the findings and recommendations with the patient. If applicable, a reminder letter will be sent to the patient regarding the next appointment. BI-RADS CATEGORY  6: Known biopsy-proven malignancy. Electronically Signed   By: Nolon Nations M.D.  On: 01/10/2020 09:54     Assessment and plan- Patient is a 64 y.o. female invasive mammary carcinoma of the right breast clinical prognostic stage IamcT1 ccN0 cM0 ER/PR positive HER-2/neu positive.   She is here for on treatment assessment prior to cycle 4 of neoadjuvant TCHP chemotherapy  Patient had an interim ultrasound after 3 cycles of chemo which shows .Out of the 2 breast masses 1 is overall stable in size and the other one is mildly decreased in size.  At this time plan is  to complete 6 cycles of neoadjuvant TCHP chemotherapy.  I will obtain an interim echocardiogram/MUGA since it has been 3 months since her prior test.  Patient receives Neulasta on day 2  Mild bilateral leg swelling: Improved with leg elevation.  Continue to monitor follow-up on echocardiogram  Patient feels better with interim fluids between 2 cycles of chemotherapy and I will schedule her for 8 in 10 days time.  Herceptin/Perjeta-induced diarrhea: Overall mild and well controlled with Imodium.  Continue to monitor  Dyspepsia: Continue Protonix 20 mg twice a day.  She will be getting an EGD in 10 days time  I will see her back in 3 weeks with CBC with differential, CMP for cycle 4 of TCHP chemotherapy  Chemo-induced thrombocytopenia: Platelets are greater than 100 therefore okay to proceed with chemotherapy today.  Continue to monitor   Visit Diagnosis 1. Invasive carcinoma of breast ([place])   2. History of ongoing treatment with high-risk medication   3. Encounter for antineoplastic chemotherapy   4. Drug-induced diarrhea   5. Chemotherapy-induced thrombocytopenia      Dr. Randa Evens, MD, MPH [facility] at [address] [facility] [phone] 01/19/2020 12:50 PM

## 2020-01-19 NOTE — Progress Notes (Signed)
Pt had some diarrhea on Monday and takes imodium when it starts. She also is having lower edema to her legs. She does elevate her legs at night

## 2020-01-20 ENCOUNTER — Inpatient Hospital Stay: Payer: Medicare Other

## 2020-01-20 ENCOUNTER — Encounter: Payer: Self-pay | Admitting: Oncology

## 2020-01-20 ENCOUNTER — Other Ambulatory Visit: Payer: Self-pay

## 2020-01-20 DIAGNOSIS — Z5112 Encounter for antineoplastic immunotherapy: Secondary | ICD-10-CM | POA: Diagnosis not present

## 2020-01-20 DIAGNOSIS — C50919 Malignant neoplasm of unspecified site of unspecified female breast: Secondary | ICD-10-CM

## 2020-01-20 MED ORDER — PEGFILGRASTIM INJECTION 6 MG/0.6ML ~~LOC~~
6.0000 mg | PREFILLED_SYRINGE | Freq: Once | SUBCUTANEOUS | Status: AC
  Administered 2020-01-20: 6 mg via SUBCUTANEOUS
  Filled 2020-01-20: qty 0.6

## 2020-01-26 ENCOUNTER — Telehealth: Payer: Self-pay | Admitting: *Deleted

## 2020-01-26 NOTE — Telephone Encounter (Signed)
Per patient request to cancel her 01/27/20 IVF Infusion.Pt stated that she'll call back to R/s if needed.

## 2020-01-27 ENCOUNTER — Inpatient Hospital Stay: Payer: Medicare Other

## 2020-01-31 ENCOUNTER — Encounter: Payer: Self-pay | Admitting: Oncology

## 2020-01-31 ENCOUNTER — Other Ambulatory Visit: Payer: Self-pay | Admitting: Oncology

## 2020-01-31 MED ORDER — POTASSIUM CHLORIDE 20 MEQ PO PACK: 20.0000 meq | PACK | Freq: Every day | ORAL | 0 refills | Status: DC

## 2020-02-02 ENCOUNTER — Other Ambulatory Visit: Admission: RE | Admit: 2020-02-02 | Payer: Medicare Other | Source: Ambulatory Visit

## 2020-02-02 ENCOUNTER — Encounter
Admission: RE | Admit: 2020-02-02 | Discharge: 2020-02-02 | Disposition: A | Discharge status: Home / Self Care | Payer: Medicare Other | Source: Ambulatory Visit | Attending: Oncology | Admitting: Oncology

## 2020-02-02 ENCOUNTER — Other Ambulatory Visit: Payer: Self-pay

## 2020-02-02 DIAGNOSIS — Z79899 Other long term (current) drug therapy: Secondary | ICD-10-CM | POA: Diagnosis present

## 2020-02-02 DIAGNOSIS — C50919 Malignant neoplasm of unspecified site of unspecified female breast: Secondary | ICD-10-CM

## 2020-02-02 MED ORDER — TECHNETIUM TC 99M-LABELED RED BLOOD CELLS IV KIT
22.0500 | PACK | Freq: Once | INTRAVENOUS | Status: AC | PRN
  Administered 2020-02-02: 22.05 via INTRAVENOUS

## 2020-02-06 ENCOUNTER — Encounter: Admission: RE | Payer: Self-pay | Source: Home / Self Care

## 2020-02-06 ENCOUNTER — Ambulatory Visit: Admission: RE | Admit: 2020-02-06 | Payer: Medicare Other | Source: Home / Self Care | Admitting: Gastroenterology

## 2020-02-06 SURGERY — ESOPHAGOGASTRODUODENOSCOPY (EGD) WITH PROPOFOL
Anesthesia: General

## 2020-02-06 NOTE — Progress Notes (Signed)
Phoned patient to inquire how she is doing with chemotherapy. States she hs 2 more treatments. Still has diarrhea with chemotherapy, but controlling better.  Received her package from [facility].  Plan to accompany to initial radiation consult.

## 2020-02-09 ENCOUNTER — Other Ambulatory Visit: Payer: Self-pay

## 2020-02-09 ENCOUNTER — Encounter: Payer: Self-pay | Admitting: Oncology

## 2020-02-09 ENCOUNTER — Inpatient Hospital Stay (HOSPITAL_BASED_OUTPATIENT_CLINIC_OR_DEPARTMENT_OTHER): Payer: Medicare Other | Admitting: Oncology

## 2020-02-09 ENCOUNTER — Inpatient Hospital Stay: Payer: Medicare Other | Attending: Oncology

## 2020-02-09 ENCOUNTER — Inpatient Hospital Stay: Payer: Medicare Other

## 2020-02-09 VITALS — BP 145/72 | HR 70 | Temp 96.1°F | Resp 16 | Wt 192.6 lb

## 2020-02-09 DIAGNOSIS — Z5189 Encounter for other specified aftercare: Secondary | ICD-10-CM | POA: Insufficient documentation

## 2020-02-09 DIAGNOSIS — Z5112 Encounter for antineoplastic immunotherapy: Secondary | ICD-10-CM | POA: Diagnosis present

## 2020-02-09 DIAGNOSIS — Z5111 Encounter for antineoplastic chemotherapy: Secondary | ICD-10-CM

## 2020-02-09 DIAGNOSIS — D6959 Other secondary thrombocytopenia: Secondary | ICD-10-CM

## 2020-02-09 DIAGNOSIS — C50919 Malignant neoplasm of unspecified site of unspecified female breast: Secondary | ICD-10-CM

## 2020-02-09 DIAGNOSIS — D6481 Anemia due to antineoplastic chemotherapy: Secondary | ICD-10-CM | POA: Insufficient documentation

## 2020-02-09 DIAGNOSIS — C50911 Malignant neoplasm of unspecified site of right female breast: Secondary | ICD-10-CM | POA: Insufficient documentation

## 2020-02-09 DIAGNOSIS — T451X5A Adverse effect of antineoplastic and immunosuppressive drugs, initial encounter: Secondary | ICD-10-CM

## 2020-02-09 DIAGNOSIS — N179 Acute kidney failure, unspecified: Secondary | ICD-10-CM

## 2020-02-09 LAB — CBC WITH DIFFERENTIAL/PLATELET
Abs Immature Granulocytes: 0.04 10*3/uL (ref 0.00–0.07)
Basophils Absolute: 0 10*3/uL (ref 0.0–0.1)
Basophils Relative: 0 %
Eosinophils Absolute: 0 10*3/uL (ref 0.0–0.5)
Eosinophils Relative: 0 %
HCT: 27.4 % — ABNORMAL LOW (ref 36.0–46.0)
Hemoglobin: 8.6 g/dL — ABNORMAL LOW (ref 12.0–15.0)
Immature Granulocytes: 1 %
Lymphocytes Relative: 24 %
Lymphs Abs: 1.9 10*3/uL (ref 0.7–4.0)
MCH: 27.2 pg (ref 26.0–34.0)
MCHC: 31.4 g/dL (ref 30.0–36.0)
MCV: 86.7 fL (ref 80.0–100.0)
Monocytes Absolute: 0.7 10*3/uL (ref 0.1–1.0)
Monocytes Relative: 9 %
Neutro Abs: 5.2 10*3/uL (ref 1.7–7.7)
Neutrophils Relative %: 66 %
Platelets: 127 10*3/uL — ABNORMAL LOW (ref 150–400)
RBC: 3.16 MIL/uL — ABNORMAL LOW (ref 3.87–5.11)
RDW: 19.6 % — ABNORMAL HIGH (ref 11.5–15.5)
WBC: 7.9 10*3/uL (ref 4.0–10.5)
nRBC: 0 % (ref 0.0–0.2)

## 2020-02-09 LAB — COMPREHENSIVE METABOLIC PANEL
ALT: 16 U/L (ref 0–44)
AST: 18 U/L (ref 15–41)
Albumin: 3.8 g/dL (ref 3.5–5.0)
Alkaline Phosphatase: 92 U/L (ref 38–126)
Anion gap: 9 (ref 5–15)
BUN: 30 mg/dL — ABNORMAL HIGH (ref 8–23)
CO2: 27 mmol/L (ref 22–32)
Calcium: 9.2 mg/dL (ref 8.9–10.3)
Chloride: 106 mmol/L (ref 98–111)
Creatinine, Ser: 1.1 mg/dL — ABNORMAL HIGH (ref 0.44–1.00)
GFR calc Af Amer: >60 mL/min (ref >60)
GFR calc non Af Amer: 53 mL/min — ABNORMAL LOW (ref >60)
Glucose, Bld: 94 mg/dL (ref 70–99)
Potassium: 3.6 mmol/L (ref 3.5–5.1)
Sodium: 142 mmol/L (ref 135–145)
Total Bilirubin: 0.4 mg/dL (ref 0.3–1.2)
Total Protein: 6.5 g/dL (ref 6.5–8.1)

## 2020-02-09 MED ORDER — HEPARIN SOD (PORK) LOCK FLUSH 100 UNIT/ML IV SOLN
500.0000 [IU] | Freq: Once | INTRAVENOUS | Status: DC
  Filled 2020-02-09: qty 5

## 2020-02-09 MED ORDER — SODIUM CHLORIDE 0.9 % IV SOLN
60.0000 mg/m2 | Freq: Once | INTRAVENOUS | Status: AC
  Administered 2020-02-09: 120 mg via INTRAVENOUS
  Filled 2020-02-09: qty 12

## 2020-02-09 MED ORDER — SODIUM CHLORIDE 0.9 % IV SOLN: 406.4000 mg | Freq: Once | INTRAVENOUS | Status: DC

## 2020-02-09 MED ORDER — SODIUM CHLORIDE 0.9% FLUSH
10.0000 mL | Freq: Once | INTRAVENOUS | Status: AC
  Administered 2020-02-09: 10 mL via INTRAVENOUS
  Filled 2020-02-09: qty 10

## 2020-02-09 MED ORDER — DIPHENHYDRAMINE HCL 25 MG PO CAPS
50.0000 mg | ORAL_CAPSULE | Freq: Once | ORAL | Status: AC
  Administered 2020-02-09: 50 mg via ORAL
  Filled 2020-02-09: qty 2

## 2020-02-09 MED ORDER — SODIUM CHLORIDE 0.9 % IV SOLN
420.0000 mg | Freq: Once | INTRAVENOUS | Status: AC
  Administered 2020-02-09: 420 mg via INTRAVENOUS
  Filled 2020-02-09: qty 14

## 2020-02-09 MED ORDER — SODIUM CHLORIDE 0.9 % IV SOLN
Freq: Once | INTRAVENOUS | Status: AC
  Filled 2020-02-09: qty 250

## 2020-02-09 MED ORDER — SODIUM CHLORIDE 0.9 % IV SOLN
380.0000 mg | Freq: Once | INTRAVENOUS | Status: AC
  Administered 2020-02-09: 380 mg via INTRAVENOUS
  Filled 2020-02-09: qty 38

## 2020-02-09 MED ORDER — ACETAMINOPHEN 325 MG PO TABS
650.0000 mg | ORAL_TABLET | Freq: Once | ORAL | Status: AC
  Administered 2020-02-09: 650 mg via ORAL
  Filled 2020-02-09: qty 2

## 2020-02-09 MED ORDER — PALONOSETRON HCL INJECTION 0.25 MG/5ML
0.2500 mg | Freq: Once | INTRAVENOUS | Status: AC
  Administered 2020-02-09: 0.25 mg via INTRAVENOUS
  Filled 2020-02-09: qty 5

## 2020-02-09 MED ORDER — SODIUM CHLORIDE 0.9 % IV SOLN
10.0000 mg | Freq: Once | INTRAVENOUS | Status: AC
  Administered 2020-02-09: 10 mg via INTRAVENOUS
  Filled 2020-02-09: qty 10

## 2020-02-09 MED ORDER — SODIUM CHLORIDE 0.9 % IV SOLN
150.0000 mg | Freq: Once | INTRAVENOUS | Status: AC
  Administered 2020-02-09: 150 mg via INTRAVENOUS
  Filled 2020-02-09: qty 5

## 2020-02-09 MED ORDER — HEPARIN SOD (PORK) LOCK FLUSH 100 UNIT/ML IV SOLN
500.0000 [IU] | Freq: Once | INTRAVENOUS | Status: AC | PRN
  Administered 2020-02-09: 500 [IU]
  Filled 2020-02-09: qty 5

## 2020-02-09 MED ORDER — TRASTUZUMAB-DKST CHEMO 150 MG IV SOLR
540.0000 mg | Freq: Once | INTRAVENOUS | Status: AC
  Administered 2020-02-09: 540 mg via INTRAVENOUS
  Filled 2020-02-09: qty 25.71

## 2020-02-09 NOTE — Progress Notes (Signed)
Still having loose stools and using imodium. She has never tried the lomotil. I told her to try it. She also does not take the first imodium until her diarrhea gets watery.

## 2020-02-10 ENCOUNTER — Inpatient Hospital Stay: Payer: Medicare Other

## 2020-02-10 ENCOUNTER — Encounter: Payer: Self-pay | Admitting: Oncology

## 2020-02-10 DIAGNOSIS — Z5112 Encounter for antineoplastic immunotherapy: Secondary | ICD-10-CM | POA: Diagnosis not present

## 2020-02-10 DIAGNOSIS — C50919 Malignant neoplasm of unspecified site of unspecified female breast: Secondary | ICD-10-CM

## 2020-02-10 MED ORDER — PEGFILGRASTIM INJECTION 6 MG/0.6ML ~~LOC~~
6.0000 mg | PREFILLED_SYRINGE | Freq: Once | SUBCUTANEOUS | Status: AC
  Administered 2020-02-10: 6 mg via SUBCUTANEOUS
  Filled 2020-02-10: qty 0.6

## 2020-02-11 NOTE — Progress Notes (Signed)
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 It showed 2 suspicious lesions in the right breast. Diagnostic mammogram and ultrasound confirmed 2 masses in the 9 o'clock position of the right breast measuring 1.1 x 1.2 x 1.2 cm and the other one measuring 0.8 x 0.8 x 0.9 cm. These 2 masses were 1.7 cm apart. Sonographic evaluation of the right axilla did not show any enlarged adenopathy. 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  Baseline MUGA scan showed EF of 58%. MRI showed two distinct masses in the right breast 1.5 cm and 1.2 cm 2 cm apart no additional suspicious masses or abnormal enhancement identified. No suspicious adenopathy   Interval history-patient reports ongoing fatigue from chemotherapy.  Also has symptoms of significant heartburn and we will start him on Protonix and referred to GI for the same.  Does have on and off diarrhea for which he has used Imodium but has not picked up  her prescription for Lomotil so far.  ECOG PS- 1 Pain scale- 0   Review of systems- Review of Systems  Constitutional: Positive for malaise/fatigue. Negative for chills, fever and weight loss.  HENT: Negative for congestion, ear discharge and nosebleeds.   Eyes: Negative for blurred vision.  Respiratory: Negative for cough, hemoptysis, sputum production, shortness of breath and wheezing.   Cardiovascular: Negative for chest pain, palpitations, orthopnea and claudication.  Gastrointestinal: Positive for diarrhea. Negative for abdominal pain, blood in stool, constipation, heartburn, melena, nausea and vomiting.  Genitourinary: Negative for dysuria, flank pain, frequency, hematuria and urgency.  Musculoskeletal: Negative for back pain, joint pain and myalgias.  Skin: Negative for rash.  Neurological: Negative for dizziness, tingling, focal weakness, seizures, weakness and headaches.  Endo/Heme/Allergies: Does not bruise/bleed easily.  Psychiatric/Behavioral: Negative for depression and suicidal ideas. The patient does not have insomnia.        Allergies  Allergen Reactions  . Statins     Other reaction(s): Muscle Pain Possible hives also     Past Medical History:  Diagnosis Date  . Adverse effect of anesthesia    hard to awaken  . Anemia   . Arthritis   . Asthma   . Asthma without status asthmaticus    unspecified; Take advair each night  . Breast cancer ([place])   . Bronchitis   . Constipation   . Diabetes ([place]) 2010  . Diabetes mellitus type 2, uncomplicated ([place])   . Diabetes mellitus without complication ([place])   . Encounter for blood transfusion    hysterectomy 9-10 years ago  . Heart murmur    unspecified  . Hyperlipidemia   .  Hypertension 1991  . Hypothyroid   . Multiple thyroid nodules   . Neuromuscular disorder (H[facility])    neuropathy  . Osteoarthritis of right hip 02/04/2017  . Palpitations   . Pernicious anemia   . Pneumonia   . Sleep apnea    Use C-PAP;  can't use mouth piece. Anxiety  . Thyroid disease    nodules  . Thyroid nodule    x2  . Urinary urgency      Past Surgical History:  Procedure Laterality Date  . ABDOMINAL HYSTERECTOMY  2011   partial  . ABDOMINAL SURGERY    . CARPAL TUNNEL RELEASE Left 2008  . CARPAL TUNNEL RELEASE Right   . CHOLECYSTECTOMY  1982  . COLONOSCOPY WITH PROPOFOL N/A 10/05/2017   Procedure: COLONOSCOPY WITH PROPOFOL;  Surgeon: Manya Silvas, MD;  Location: [facility] ENDOSCOPY;  Service: Endoscopy;  Laterality: N/A;  . DIAGNOSTIC LAPAROSCOPY    . JOINT REPLACEMENT Right 02/09/2017   TOTAL HIP REPLACEMENT, DR. CARTER. [place]  . KNEE ARTHROSCOPY  1990s  . PORTACATH PLACEMENT Left 11/07/2019   Procedure: INSERTION PORT-A-CATH;  Surgeon: Herbert Pun, MD;  Location: [facility] ORS;  Service: General;  Laterality: Left;  . TOTAL HIP ARTHROPLASTY Left 06/23/2017   Procedure: TOTAL HIP ARTHROPLASTY ANTERIOR APPROACH;  Surgeon: Hessie Knows, MD;  Location: [facility] ORS;  Service: Orthopedics;  Laterality: Left;    Social History   Socioeconomic History  . Marital status: Married    Spouse name: Not on file  . Number of children: 2  . Years of education: some college  . Highest education level: Not on file  Occupational History  . Occupation: retired    Comment: [employer]  Tobacco Use  . Smoking status: Never Smoker  . Smokeless tobacco: Never Used  Substance and Sexual Activity  . Alcohol use: No  . Drug use: No  . Sexual activity: Not on file  Other Topics Concern  . Not on file  Social History Narrative  . Not on file   Social Determinants of Health   Financial Resource Strain:   . Difficulty of Paying Living Expenses:   Food Insecurity:   . Worried About Charity fundraiser in the Last Year:   . Arboriculturist in the Last Year:   Transportation Needs:   . Film/video editor (Medical):   Marland Kitchen Lack of Transportation (Non-Medical):   Physical Activity:   . Days of  Exercise per Week:   . Minutes of Exercise per Session:   Stress:   . Feeling of Stress :   Social Connections:   . Frequency of Communication with Friends and Family:   . Frequency of Social Gatherings with Friends and Family:   . Attends Religious Services:   . Active Member of Clubs or Organizations:   . Attends Archivist Meetings:   Marland Kitchen Marital Status:   Intimate Partner Violence:   . Fear of Current or Ex-Partner:   . Emotionally Abused:   Marland Kitchen Physically Abused:   . Sexually Abused:     Family History  Problem Relation Age of Onset  . Heart disease Mother   . Hypertension Mother   . Arrhythmia Mother   . Heart attack Mother   . Diabetes Mother   . Hypertension Father   . Parkinson's disease Father   . Coronary artery disease Maternal Uncle   . Heart attack Maternal Uncle   . Diabetes Sister   . Diabetes Brother   .  Heart disease Brother   . Breast cancer Neg Hx      Current Outpatient Medications:  .  acetaminophen (TYLENOL) 500 MG tablet, Take 500 mg by mouth every 6 (six) hours as needed., Disp: , Rfl:  .  albuterol (PROVENTIL HFA;VENTOLIN HFA) 108 (90 Base) MCG/ACT inhaler, Inhale 2 puffs into the lungs every 6 (six) hours as needed for wheezing or shortness of breath., Disp: , Rfl:  .  carvedilol (COREG) 12.5 MG tablet, Take 12.5 mg by mouth 2 (two) times daily. , Disp: , Rfl:  .  cholecalciferol (VITAMIN D) 1000 units tablet, Take 1,000 Units by mouth daily., Disp: , Rfl:  .  Fluticasone-Salmeterol (ADVAIR) 250-50 MCG/DOSE AEPB, Inhale 1 puff into the lungs 2 (two) times daily. , Disp: , Rfl:  .  gabapentin (NEURONTIN) 100 MG capsule, Take 200 mg by mouth 2 (two) times daily., Disp: , Rfl:  .  lidocaine-prilocaine (EMLA) cream, Apply to affected area once (Patient taking differently: Apply 1 application topically once. Apply to affected area once), Disp: 30 g, Rfl: 3 .  loperamide (IMODIUM) 2 MG capsule, Take 2 mg by mouth as needed for diarrhea or loose  stools., Disp: , Rfl:  .  loratadine (CLARITIN) 10 MG tablet, Take 10 mg by mouth daily as needed for allergies., Disp: , Rfl:  .  metFORMIN (GLUCOPHAGE-XR) 500 MG 24 hr tablet, Take 500 mg by mouth daily., Disp: , Rfl:  .  pantoprazole (PROTONIX) 20 MG tablet, Take 1 tablet (20 mg total) by mouth 2 (two) times daily before a meal., Disp: 60 tablet, Rfl: 2 .  potassium chloride (KLOR-CON) 20 MEQ packet, Take 20 mEq by mouth daily., Disp: 30 packet, Rfl: 0 .  telmisartan-hydrochlorothiazide (MICARDIS HCT) 80-25 MG tablet, Take 1 tablet by mouth daily. , Disp: , Rfl:  .  diphenoxylate-atropine (LOMOTIL) 2.5-0.025 MG tablet, Take 1 tablet by mouth 4 (four) times daily as needed for diarrhea or loose stools. (Patient not taking: Reported on 02/09/2020), Disp: 60 tablet, Rfl: 0 .  LORazepam (ATIVAN) 0.5 MG tablet, Take 1 tablet (0.5 mg total) by mouth every 6 (six) hours as needed (Nausea or vomiting). (Patient not taking: Reported on 02/09/2020), Disp: 30 tablet, Rfl: 0 .  ondansetron (ZOFRAN) 8 MG tablet, Take 1 tablet (8 mg total) by mouth 2 (two) times daily as needed (Nausea or vomiting). Begin 4 days after chemotherapy. (Patient not taking: Reported on 02/09/2020), Disp: 30 tablet, Rfl: 1 .  prochlorperazine (COMPAZINE) 10 MG tablet, Take 1 tablet (10 mg total) by mouth every 6 (six) hours as needed (Nausea or vomiting). (Patient not taking: Reported on 01/19/2020), Disp: 30 tablet, Rfl: 1 .  solifenacin (VESICARE) 10 MG tablet, Take 10 mg by mouth daily. , Disp: , Rfl:  .  triamcinolone cream (KENALOG) 0.5 %, Apply 1 application topically 2 (two) times daily. , Disp: , Rfl:  No current facility-administered medications for this visit.  Facility-Administered Medications Ordered in Other Visits:  .  sodium chloride flush (NS) 0.9 % injection 10 mL, 10 mL, Intravenous, PRN, Sindy Guadeloupe, MD, 10 mL at 01/05/20 1344  Physical exam:  Vitals:   02/09/20 0847  BP: (!) 145/72  Pulse: 70  Resp: 16    Temp: (!) 96.1 F (35.6 C)  TempSrc: Tympanic  Weight: 192 lb 9.6 oz (87.4 kg)   Physical Exam Constitutional:      General: She is not in acute distress. HENT:     Head: Normocephalic and atraumatic.  Eyes:  Pupils: Pupils are equal, round, and reactive to light.  Cardiovascular:     Rate and Rhythm: Normal rate and regular rhythm.     Heart sounds: Normal heart sounds.  Pulmonary:     Effort: Pulmonary effort is normal.     Breath sounds: Normal breath sounds.  Abdominal:     General: Bowel sounds are normal.     Palpations: Abdomen is soft.  Musculoskeletal:     Cervical back: Normal range of motion.  Skin:    General: Skin is warm and dry.  Neurological:     Mental Status: She is alert and oriented to person, place, and time.     Right breast mass is not palpable but patient did not have a palpable breast mass even at presentation   CMP Latest Ref Rng & Units 02/09/2020  Glucose 70 - 99 mg/dL 94  BUN 8 - 23 mg/dL 30(H)  Creatinine 0.44 - 1.00 mg/dL 1.10(H)  Sodium 135 - 145 mmol/L 142  Potassium 3.5 - 5.1 mmol/L 3.6  Chloride 98 - 111 mmol/L 106  CO2 22 - 32 mmol/L 27  Calcium 8.9 - 10.3 mg/dL 9.2  Total Protein 6.5 - 8.1 g/dL 6.5  Total Bilirubin 0.3 - 1.2 mg/dL 0.4  Alkaline Phos 38 - 126 U/L 92  AST 15 - 41 U/L 18  ALT 0 - 44 U/L 16   CBC Latest Ref Rng & Units 02/09/2020  WBC 4.0 - 10.5 K/uL 7.9  Hemoglobin 12.0 - 15.0 g/dL 8.6(L)  Hematocrit 36.0 - 46.0 % 27.4(L)  Platelets 150 - 400 K/uL 127(L)    No images are attached to the encounter.  NM Cardiac Muga Rest  Result Date: 02/02/2020 CLINICAL DATA:  Breast cancer. Evaluate cardiac function in relation to chemotherapy. EXAM: NUCLEAR MEDICINE CARDIAC BLOOD POOL IMAGING (MUGA) TECHNIQUE: Cardiac multi-gated acquisition was performed at rest following intravenous injection of Tc-35mlabeled red blood cells. RADIOPHARMACEUTICALS:  20.1 mCi Tc-940mertechnetate in-vitro labeled red blood cells IV  COMPARISON:  MUGA scan 11/01/2019 FINDINGS: No  focal wall motion abnormality of the left ventricle. Calculated left ventricular ejection fraction equals 65% (comparable to 60 % on 11/01/2019) IMPRESSION: Calculated left ventricular ejection fraction equals 65% (comparable to 60 % on 11/01/2019) Electronically Signed   By: StSuzy Bouchard.D.   On: 02/02/2020 15:49     Assessment and plan- Patient is a [age].o. female with invasive mammary carcinoma of the right breast clinical prognostic stage IamcT1 ccN0 cM0 ER/PR positive HER-2/neu positive.   She is here for on treatment assessment prior to cycle 5 of neoadjuvant chemotherapy TCHP  Patient does have significant chemo-induced anemia.  We had previously done her anemia work-up including iron studies B12 and folate which were unremarkable.  Counts are otherwise okay to proceed with cycle 5 of neoadjuvant TCHP chemotherapy today with onpro Neulasta support.  We will repeat her CBC in 10 days time for possible blood transfusion.  Repeat echocardiogram on 02/02/2020 showed normal EF  Chemo-induced thrombocytopenia: Mild continue to monitor.  Next Herceptin-induced diarrhea: I have advised the patient to try Lomotil if diarrhea is not better with Imodium.  Unfortunately there are no good treatment options for diarrhea in the situation other than holding her Perjeta.  Patient reports that her diarrhea is not severe and she would like to continue with both Herceptin and Perjeta at this time.  AKI: Serum creatinine is better today at 1.1 as compared to 1.2 before   Visit Diagnosis 1. Encounter  for monoclonal antibody treatment for malignancy   2. Encounter for antineoplastic chemotherapy   3. Invasive carcinoma of breast ([place])   4. Antineoplastic chemotherapy induced anemia   5. Chemotherapy-induced thrombocytopenia   6. AKI (acute kidney injury) ([place])      Dr. Randa Evens, MD, MPH [facility] [phone] 02/11/2020 11:15 AM

## 2020-02-20 ENCOUNTER — Inpatient Hospital Stay: Payer: Medicare Other

## 2020-02-20 DIAGNOSIS — Z95828 Presence of other vascular implants and grafts: Secondary | ICD-10-CM

## 2020-02-20 DIAGNOSIS — C50919 Malignant neoplasm of unspecified site of unspecified female breast: Secondary | ICD-10-CM

## 2020-02-20 DIAGNOSIS — Z5112 Encounter for antineoplastic immunotherapy: Secondary | ICD-10-CM

## 2020-02-20 LAB — CBC WITH DIFFERENTIAL/PLATELET
Abs Immature Granulocytes: 0.17 10*3/uL — ABNORMAL HIGH (ref 0.00–0.07)
Basophils Absolute: 0 10*3/uL (ref 0.0–0.1)
Basophils Relative: 0 %
Eosinophils Absolute: 0 10*3/uL (ref 0.0–0.5)
Eosinophils Relative: 0 %
HCT: 27.2 % — ABNORMAL LOW (ref 36.0–46.0)
Hemoglobin: 8.3 g/dL — ABNORMAL LOW (ref 12.0–15.0)
Immature Granulocytes: 2 %
Lymphocytes Relative: 31 %
Lymphs Abs: 2.9 10*3/uL (ref 0.7–4.0)
MCH: 26.7 pg (ref 26.0–34.0)
MCHC: 30.5 g/dL (ref 30.0–36.0)
MCV: 87.5 fL (ref 80.0–100.0)
Monocytes Absolute: 0.8 10*3/uL (ref 0.1–1.0)
Monocytes Relative: 9 %
Neutro Abs: 5.3 10*3/uL (ref 1.7–7.7)
Neutrophils Relative %: 58 %
Platelets: 127 10*3/uL — ABNORMAL LOW (ref 150–400)
RBC: 3.11 MIL/uL — ABNORMAL LOW (ref 3.87–5.11)
RDW: 18.7 % — ABNORMAL HIGH (ref 11.5–15.5)
WBC: 9.2 10*3/uL (ref 4.0–10.5)
nRBC: 0.2 % (ref 0.0–0.2)

## 2020-02-20 LAB — SAMPLE TO BLOOD BANK

## 2020-02-20 MED ORDER — HEPARIN SOD (PORK) LOCK FLUSH 100 UNIT/ML IV SOLN
500.0000 [IU] | Freq: Once | INTRAVENOUS | Status: AC
  Administered 2020-02-20: 500 [IU] via INTRAVENOUS
  Filled 2020-02-20: qty 5

## 2020-02-20 MED ORDER — SODIUM CHLORIDE 0.9% FLUSH
10.0000 mL | Freq: Once | INTRAVENOUS | Status: AC
  Administered 2020-02-20: 10 mL via INTRAVENOUS
  Filled 2020-02-20: qty 10

## 2020-02-22 NOTE — Progress Notes (Signed)
Pharmacist Chemotherapy Monitoring - Follow Up Assessment    I verify that I have reviewed each item in the below checklist:  . Regimen for the patient is scheduled for the appropriate day and plan matches scheduled date. Marland Kitchen Appropriate non-routine labs are ordered dependent on drug ordered. . If applicable, additional medications reviewed and ordered per protocol based on lifetime cumulative doses and/or treatment regimen.   Plan for follow-up and/or issues identified: No . I-vent associated with next due treatment: No . MD and/or nursing notified: No  Natally Ribera K 02/22/2020 7:52 AM

## 2020-02-29 NOTE — Progress Notes (Signed)
Patient is here today for follow up and last treatment, her husband was asking how long does she need to be on potassium, she is doing well no major complaints

## 2020-03-01 ENCOUNTER — Other Ambulatory Visit: Payer: Self-pay

## 2020-03-01 ENCOUNTER — Inpatient Hospital Stay (HOSPITAL_BASED_OUTPATIENT_CLINIC_OR_DEPARTMENT_OTHER): Payer: Medicare Other | Admitting: Oncology

## 2020-03-01 ENCOUNTER — Encounter: Payer: Self-pay | Admitting: Oncology

## 2020-03-01 ENCOUNTER — Inpatient Hospital Stay: Payer: Medicare Other | Attending: Oncology

## 2020-03-01 ENCOUNTER — Inpatient Hospital Stay: Payer: Medicare Other

## 2020-03-01 VITALS — BP 157/78 | HR 66 | Temp 95.3°F | Resp 18 | Wt 189.0 lb

## 2020-03-01 DIAGNOSIS — C50919 Malignant neoplasm of unspecified site of unspecified female breast: Secondary | ICD-10-CM

## 2020-03-01 DIAGNOSIS — D6481 Anemia due to antineoplastic chemotherapy: Secondary | ICD-10-CM | POA: Diagnosis not present

## 2020-03-01 DIAGNOSIS — C50911 Malignant neoplasm of unspecified site of right female breast: Secondary | ICD-10-CM | POA: Diagnosis present

## 2020-03-01 DIAGNOSIS — E860 Dehydration: Secondary | ICD-10-CM | POA: Diagnosis not present

## 2020-03-01 DIAGNOSIS — Z5111 Encounter for antineoplastic chemotherapy: Secondary | ICD-10-CM | POA: Diagnosis present

## 2020-03-01 DIAGNOSIS — R197 Diarrhea, unspecified: Secondary | ICD-10-CM | POA: Insufficient documentation

## 2020-03-01 DIAGNOSIS — D6959 Other secondary thrombocytopenia: Secondary | ICD-10-CM

## 2020-03-01 DIAGNOSIS — Z170 Estrogen receptor positive status [ER+]: Secondary | ICD-10-CM | POA: Diagnosis not present

## 2020-03-01 DIAGNOSIS — Z5189 Encounter for other specified aftercare: Secondary | ICD-10-CM | POA: Insufficient documentation

## 2020-03-01 DIAGNOSIS — Z5112 Encounter for antineoplastic immunotherapy: Secondary | ICD-10-CM

## 2020-03-01 DIAGNOSIS — T451X5A Adverse effect of antineoplastic and immunosuppressive drugs, initial encounter: Secondary | ICD-10-CM

## 2020-03-01 LAB — COMPREHENSIVE METABOLIC PANEL
ALT: 13 U/L (ref 0–44)
AST: 16 U/L (ref 15–41)
Albumin: 3.9 g/dL (ref 3.5–5.0)
Alkaline Phosphatase: 99 U/L (ref 38–126)
Anion gap: 10 (ref 5–15)
BUN: 28 mg/dL — ABNORMAL HIGH (ref 8–23)
CO2: 27 mmol/L (ref 22–32)
Calcium: 9.3 mg/dL (ref 8.9–10.3)
Chloride: 105 mmol/L (ref 98–111)
Creatinine, Ser: 1.38 mg/dL — ABNORMAL HIGH (ref 0.44–1.00)
GFR calc Af Amer: 46 mL/min — ABNORMAL LOW (ref >60)
GFR calc non Af Amer: 40 mL/min — ABNORMAL LOW (ref >60)
Glucose, Bld: 141 mg/dL — ABNORMAL HIGH (ref 70–99)
Potassium: 4.3 mmol/L (ref 3.5–5.1)
Sodium: 142 mmol/L (ref 135–145)
Total Bilirubin: 0.5 mg/dL (ref 0.3–1.2)
Total Protein: 7.1 g/dL (ref 6.5–8.1)

## 2020-03-01 LAB — CBC WITH DIFFERENTIAL/PLATELET
Abs Immature Granulocytes: 0.19 10*3/uL — ABNORMAL HIGH (ref 0.00–0.07)
Basophils Absolute: 0 10*3/uL (ref 0.0–0.1)
Basophils Relative: 0 %
Eosinophils Absolute: 0 10*3/uL (ref 0.0–0.5)
Eosinophils Relative: 0 %
HCT: 27.7 % — ABNORMAL LOW (ref 36.0–46.0)
Hemoglobin: 8.4 g/dL — ABNORMAL LOW (ref 12.0–15.0)
Immature Granulocytes: 3 %
Lymphocytes Relative: 15 %
Lymphs Abs: 0.8 10*3/uL (ref 0.7–4.0)
MCH: 27.2 pg (ref 26.0–34.0)
MCHC: 30.3 g/dL (ref 30.0–36.0)
MCV: 89.6 fL (ref 80.0–100.0)
Monocytes Absolute: 0 10*3/uL — ABNORMAL LOW (ref 0.1–1.0)
Monocytes Relative: 1 %
Neutro Abs: 4.5 10*3/uL (ref 1.7–7.7)
Neutrophils Relative %: 81 %
Platelets: 133 10*3/uL — ABNORMAL LOW (ref 150–400)
RBC: 3.09 MIL/uL — ABNORMAL LOW (ref 3.87–5.11)
RDW: 18.6 % — ABNORMAL HIGH (ref 11.5–15.5)
WBC: 5.6 10*3/uL (ref 4.0–10.5)
nRBC: 0 % (ref 0.0–0.2)

## 2020-03-01 MED ORDER — HEPARIN SOD (PORK) LOCK FLUSH 100 UNIT/ML IV SOLN
INTRAVENOUS | Status: AC
  Filled 2020-03-01: qty 5

## 2020-03-01 MED ORDER — SODIUM CHLORIDE 0.9 % IV SOLN
150.0000 mg | Freq: Once | INTRAVENOUS | Status: AC
  Administered 2020-03-01: 150 mg via INTRAVENOUS
  Filled 2020-03-01: qty 150

## 2020-03-01 MED ORDER — HEPARIN SOD (PORK) LOCK FLUSH 100 UNIT/ML IV SOLN
500.0000 [IU] | Freq: Once | INTRAVENOUS | Status: AC
  Administered 2020-03-01: 14:00:00 500 [IU] via INTRAVENOUS
  Filled 2020-03-01: qty 5

## 2020-03-01 MED ORDER — TRASTUZUMAB-DKST CHEMO 150 MG IV SOLR
540.0000 mg | Freq: Once | INTRAVENOUS | Status: AC
  Administered 2020-03-01: 540 mg via INTRAVENOUS
  Filled 2020-03-01: qty 25.71

## 2020-03-01 MED ORDER — SODIUM CHLORIDE 0.9 % IV SOLN
10.0000 mg | Freq: Once | INTRAVENOUS | Status: AC
  Administered 2020-03-01: 10:00:00 10 mg via INTRAVENOUS
  Filled 2020-03-01: qty 10

## 2020-03-01 MED ORDER — ACETAMINOPHEN 325 MG PO TABS
650.0000 mg | ORAL_TABLET | Freq: Once | ORAL | Status: AC
  Administered 2020-03-01: 10:00:00 650 mg via ORAL
  Filled 2020-03-01: qty 2

## 2020-03-01 MED ORDER — SODIUM CHLORIDE 0.9 % IV SOLN
344.4000 mg | Freq: Once | INTRAVENOUS | Status: AC
  Administered 2020-03-01: 340 mg via INTRAVENOUS
  Filled 2020-03-01: qty 34

## 2020-03-01 MED ORDER — SODIUM CHLORIDE 0.9 % IV SOLN
60.0000 mg/m2 | Freq: Once | INTRAVENOUS | Status: AC
  Administered 2020-03-01: 120 mg via INTRAVENOUS
  Filled 2020-03-01: qty 12

## 2020-03-01 MED ORDER — DIPHENHYDRAMINE HCL 25 MG PO CAPS
50.0000 mg | ORAL_CAPSULE | Freq: Once | ORAL | Status: AC
  Administered 2020-03-01: 10:00:00 50 mg via ORAL
  Filled 2020-03-01: qty 2

## 2020-03-01 MED ORDER — PALONOSETRON HCL INJECTION 0.25 MG/5ML
0.2500 mg | Freq: Once | INTRAVENOUS | Status: AC
  Administered 2020-03-01: 0.25 mg via INTRAVENOUS
  Filled 2020-03-01: qty 5

## 2020-03-01 MED ORDER — SODIUM CHLORIDE 0.9% FLUSH
10.0000 mL | Freq: Once | INTRAVENOUS | Status: AC
  Administered 2020-03-01: 10 mL via INTRAVENOUS
  Filled 2020-03-01: qty 10

## 2020-03-01 MED ORDER — SODIUM CHLORIDE 0.9 % IV SOLN
420.0000 mg | Freq: Once | INTRAVENOUS | Status: AC
  Administered 2020-03-01: 12:00:00 420 mg via INTRAVENOUS
  Filled 2020-03-01: qty 14

## 2020-03-01 MED ORDER — SODIUM CHLORIDE 0.9 % IV SOLN
Freq: Once | INTRAVENOUS | Status: AC
  Filled 2020-03-01: qty 250

## 2020-03-02 ENCOUNTER — Inpatient Hospital Stay: Payer: Medicare Other

## 2020-03-02 DIAGNOSIS — C50919 Malignant neoplasm of unspecified site of unspecified female breast: Secondary | ICD-10-CM

## 2020-03-02 DIAGNOSIS — Z5112 Encounter for antineoplastic immunotherapy: Secondary | ICD-10-CM | POA: Diagnosis not present

## 2020-03-02 MED ORDER — PEGFILGRASTIM INJECTION 6 MG/0.6ML ~~LOC~~
6.0000 mg | PREFILLED_SYRINGE | Freq: Once | SUBCUTANEOUS | Status: AC
  Administered 2020-03-02: 6 mg via SUBCUTANEOUS
  Filled 2020-03-02: qty 0.6

## 2020-03-02 NOTE — Progress Notes (Signed)
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 It showed 2 suspicious lesions in the right breast. Diagnostic mammogram and ultrasound confirmed 2 masses in the 9 o'clock position of the right breast measuring 1.1 x 1.2 x 1.2 cm and the other one measuring 0.8 x 0.8 x 0.9 cm. These 2 masses were 1.7 cm apart. Sonographic evaluation of the right axilla did not show any enlarged adenopathy. 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  Baseline MUGA scan showed EF of 58%. MRI showed two distinct masses in the right breast 1.5 cm and 1.2 cm 2 cm apart no additional suspicious masses or abnormal enhancement identified. No suspicious adenopathy  6 cycles of neoadjuvant TCHP chemotherapy completed on 02/29/2020.  Interval history-reports ongoing fatigue.  Diarrhea is sporadic and intermittent and relatively well controlled with Imodium and Lomotil.  Reports some tingling numbness in her hands and feet  which is mild and intermittent and does not affect with her daily activities.  ECOG PS- 1 Pain scale- 0 Opioid associated constipation- no  Review of systems- Review of Systems  Constitutional: Positive for malaise/fatigue. Negative for chills, fever and weight loss.  HENT: Negative for congestion, ear discharge and nosebleeds.   Eyes: Negative for blurred vision.  Respiratory: Negative for cough, hemoptysis, sputum production, shortness of breath and wheezing.   Cardiovascular: Negative for chest pain, palpitations, orthopnea and claudication.  Gastrointestinal: Positive for diarrhea. Negative for abdominal pain, blood in stool, constipation, heartburn, melena, nausea and vomiting.  Genitourinary: Negative for dysuria, flank pain, frequency, hematuria and urgency.  Musculoskeletal: Negative for back pain, joint pain and myalgias.  Skin: Negative for rash.  Neurological: Positive for sensory change (Peripheral neuropathy). Negative for dizziness, tingling, focal weakness, seizures, weakness and headaches.  Endo/Heme/Allergies: Does not bruise/bleed easily.  Psychiatric/Behavioral: Negative for depression and suicidal ideas. The patient does not have insomnia.        Allergies  Allergen Reactions  . Statins     Other reaction(s): Muscle Pain Possible hives also     Past Medical History:  Diagnosis Date  . Adverse effect of anesthesia    hard to awaken  . Anemia   . Arthritis   . Asthma   . Asthma without status asthmaticus    unspecified; Take advair each night  . Breast cancer ([place])   . Bronchitis   . Constipation   . Diabetes ([place]) 2010  . Diabetes mellitus type 2, uncomplicated ([place])   . Diabetes mellitus without complication ([place])   . Encounter for blood transfusion    hysterectomy 9-10 years ago  . Heart murmur  unspecified  . Hyperlipidemia   . Hypertension 1991  . Hypothyroid   . Multiple thyroid nodules   . Neuromuscular disorder ([facility])    neuropathy  .  Osteoarthritis of right hip 02/04/2017  . Palpitations   . Pernicious anemia   . Pneumonia   . Sleep apnea    Use C-PAP; can't use mouth piece. Anxiety  . Thyroid disease    nodules  . Thyroid nodule    x2  . Urinary urgency      Past Surgical History:  Procedure Laterality Date  . ABDOMINAL HYSTERECTOMY  2011   partial  . ABDOMINAL SURGERY    . CARPAL TUNNEL RELEASE Left 2008  . CARPAL TUNNEL RELEASE Right   . CHOLECYSTECTOMY  1982  . COLONOSCOPY WITH PROPOFOL N/A 10/05/2017   Procedure: COLONOSCOPY WITH PROPOFOL;  Surgeon: Manya Silvas, MD;  Location[facility] ENDOSCOPY;  Service: Endoscopy;  Laterality: N/A;  . DIAGNOSTIC LAPAROSCOPY    . JOINT REPLACEMENT Right 02/09/2017   TOTAL HIP REPLACEMENT, DR. CARTER. [place]  . KNEE ARTHROSCOPY  1990s  . PORTACATH PLACEMENT Left 11/07/2019   Procedure: INSERTION PORT-A-CATH;  Surgeon: Herbert Pun, MD;  Location: [facility] ORS;  Service: General;  Laterality: Left;  . TOTAL HIP ARTHROPLASTY Left 06/23/2017   Procedure: TOTAL HIP ARTHROPLASTY ANTERIOR APPROACH;  Surgeon: Hessie Knows, MD;  Location: [facility] ORS;  Service: Orthopedics;  Laterality: Left;    Social History   Socioeconomic History  . Marital status: Married    Spouse name: Not on file  . Number of children: 2  . Years of education: some college  . Highest education level: Not on file  Occupational History  . Occupation: retired    Comment: [employer]  Tobacco Use  . Smoking status: Never Smoker  . Smokeless tobacco: Never Used  Substance and Sexual Activity  . Alcohol use: No  . Drug use: No  . Sexual activity: Not on file  Other Topics Concern  . Not on file  Social History Narrative  . Not on file   Social Determinants of Health   Financial Resource Strain:   . Difficulty of Paying Living Expenses:   Food Insecurity:   . Worried About Charity fundraiser in the Last Year:   . Arboriculturist in the Last Year:     Transportation Needs:   . Film/video editor (Medical):   Marland Kitchen Lack of Transportation (Non-Medical):   Physical Activity:   . Days of Exercise per Week:   . Minutes of Exercise per Session:   Stress:   . Feeling of Stress :   Social Connections:   . Frequency of Communication with Friends and Family:   . Frequency of Social Gatherings with Friends and Family:   . Attends Religious Services:   . Active Member of Clubs or Organizations:   . Attends Archivist Meetings:   Marland Kitchen Marital Status:   Intimate Partner Violence:   . Fear of Current or Ex-Partner:   . Emotionally Abused:   Marland Kitchen Physically Abused:   . Sexually Abused:     Family History  Problem Relation Age of Onset  . Heart disease Mother   . Hypertension Mother   . Arrhythmia Mother   . Heart attack Mother   . Diabetes Mother   . Hypertension Father   . Parkinson's disease Father   . Coronary artery disease Maternal Uncle   . Heart attack Maternal Uncle   . Diabetes Sister   .  Diabetes Brother   . Heart disease Brother   . Breast cancer Neg Hx      Current Outpatient Medications:  .  acetaminophen (TYLENOL) 500 MG tablet, Take 500 mg by mouth every 6 (six) hours as needed., Disp: , Rfl:  .  albuterol (PROVENTIL HFA;VENTOLIN HFA) 108 (90 Base) MCG/ACT inhaler, Inhale 2 puffs into the lungs every 6 (six) hours as needed for wheezing or shortness of breath., Disp: , Rfl:  .  carvedilol (COREG) 12.5 MG tablet, Take 12.5 mg by mouth 2 (two) times daily. , Disp: , Rfl:  .  cholecalciferol (VITAMIN D) 1000 units tablet, Take 1,000 Units by mouth daily., Disp: , Rfl:  .  Fluticasone-Salmeterol (ADVAIR) 250-50 MCG/DOSE AEPB, Inhale 1 puff into the lungs 2 (two) times daily. , Disp: , Rfl:  .  gabapentin (NEURONTIN) 100 MG capsule, Take 200 mg by mouth 2 (two) times daily., Disp: , Rfl:  .  lidocaine-prilocaine (EMLA) cream, Apply to affected area once (Patient taking differently: Apply 1 application topically once.  Apply to affected area once), Disp: 30 g, Rfl: 3 .  loperamide (IMODIUM) 2 MG capsule, Take 2 mg by mouth as needed for diarrhea or loose stools., Disp: , Rfl:  .  loratadine (CLARITIN) 10 MG tablet, Take 10 mg by mouth daily as needed for allergies., Disp: , Rfl:  .  metFORMIN (GLUCOPHAGE-XR) 500 MG 24 hr tablet, Take 500 mg by mouth daily., Disp: , Rfl:  .  pantoprazole (PROTONIX) 20 MG tablet, Take 1 tablet (20 mg total) by mouth 2 (two) times daily before a meal., Disp: 60 tablet, Rfl: 2 .  potassium chloride (KLOR-CON) 20 MEQ packet, Take 20 mEq by mouth daily., Disp: 30 packet, Rfl: 0 .  solifenacin (VESICARE) 10 MG tablet, Take 10 mg by mouth daily. , Disp: , Rfl:  .  telmisartan-hydrochlorothiazide (MICARDIS HCT) 80-25 MG tablet, Take 1 tablet by mouth daily. , Disp: , Rfl:  .  triamcinolone cream (KENALOG) 0.5 %, Apply 1 application topically 2 (two) times daily. , Disp: , Rfl:  .  diphenoxylate-atropine (LOMOTIL) 2.5-0.025 MG tablet, Take 1 tablet by mouth 4 (four) times daily as needed for diarrhea or loose stools. (Patient not taking: Reported on 02/09/2020), Disp: 60 tablet, Rfl: 0 .  LORazepam (ATIVAN) 0.5 MG tablet, Take 1 tablet (0.5 mg total) by mouth every 6 (six) hours as needed (Nausea or vomiting). (Patient not taking: Reported on 02/09/2020), Disp: 30 tablet, Rfl: 0 .  ondansetron (ZOFRAN) 8 MG tablet, Take 1 tablet (8 mg total) by mouth 2 (two) times daily as needed (Nausea or vomiting). Begin 4 days after chemotherapy. (Patient not taking: Reported on 02/09/2020), Disp: 30 tablet, Rfl: 1 .  prochlorperazine (COMPAZINE) 10 MG tablet, Take 1 tablet (10 mg total) by mouth every 6 (six) hours as needed (Nausea or vomiting). (Patient not taking: Reported on 01/19/2020), Disp: 30 tablet, Rfl: 1 No current facility-administered medications for this visit.  Facility-Administered Medications Ordered in Other Visits:  .  sodium chloride flush (NS) 0.9 % injection 10 mL, 10 mL, Intravenous,  PRN, Sindy Guadeloupe, MD, 10 mL at 01/05/20 1344  Physical exam:  Vitals:   03/01/20 0837  BP: (!) 157/78  Pulse: 66  Resp: 18  Temp: (!) 95.3 F (35.2 C)  TempSrc: Tympanic  SpO2: 100%  Weight: 189 lb (85.7 kg)   Physical Exam Constitutional:      General: She is not in acute distress. HENT:     Head:  Normocephalic and atraumatic.  Eyes:     Pupils: Pupils are equal, round, and reactive to light.  Cardiovascular:     Rate and Rhythm: Normal rate and regular rhythm.     Heart sounds: Normal heart sounds.  Pulmonary:     Effort: Pulmonary effort is normal.     Breath sounds: Normal breath sounds.  Abdominal:     General: Bowel sounds are normal.     Palpations: Abdomen is soft.  Musculoskeletal:     Cervical back: Normal range of motion.  Skin:    General: Skin is warm and dry.  Neurological:     Mental Status: She is alert and oriented to person, place, and time.   No palpable breast mass in either breast.  No palpable axillary adenopathy  CMP Latest Ref Rng & Units 03/01/2020  Glucose 70 - 99 mg/dL 141(H)  BUN 8 - 23 mg/dL 28(H)  Creatinine 0.44 - 1.00 mg/dL 1.38(H)  Sodium 135 - 145 mmol/L 142  Potassium 3.5 - 5.1 mmol/L 4.3  Chloride 98 - 111 mmol/L 105  CO2 22 - 32 mmol/L 27  Calcium 8.9 - 10.3 mg/dL 9.3  Total Protein 6.5 - 8.1 g/dL 7.1  Total Bilirubin 0.3 - 1.2 mg/dL 0.5  Alkaline Phos 38 - 126 U/L 99  AST 15 - 41 U/L 16  ALT 0 - 44 U/L 13   CBC Latest Ref Rng & Units 03/01/2020  WBC 4.0 - 10.5 K/uL 5.6  Hemoglobin 12.0 - 15.0 g/dL 8.4(L)  Hematocrit 36.0 - 46.0 % 27.7(L)  Platelets 150 - 400 K/uL 133(L)    No images are attached to the encounter.  NM Cardiac Muga Rest  Result Date: 02/02/2020 CLINICAL DATA:  Breast cancer. Evaluate cardiac function in relation to chemotherapy. EXAM: NUCLEAR MEDICINE CARDIAC BLOOD POOL IMAGING (MUGA) TECHNIQUE: Cardiac multi-gated acquisition was performed at rest following intravenous injection of Tc-58mlabeled red  blood cells. RADIOPHARMACEUTICALS:  20.1 mCi [id number] in-vitro labeled red blood cells IV COMPARISON:  MUGA scan 11/01/2019 FINDINGS: No  focal wall motion abnormality of the left ventricle. Calculated left ventricular ejection fraction equals 65% (comparable to 60 % on 11/01/2019) IMPRESSION: Calculated left ventricular ejection fraction equals 65% (comparable to 60 % on 11/01/2019) Electronically Signed   By: StSuzy Bouchard.D.   On: 02/02/2020 15:49     Assessment and plan- Patient is a [age].o. female with invasive mammary carcinoma of the right breast clinical prognostic stage IamcT1 ccN0 cM0 ER/PR positive HER-2/neu positive. She is here for on treatment assessment prior to cycle 6 of neoadjuvant TCHP chemotherapy  Counts okay to proceed with cycle 6 of neoadjuvant TCHP chemotherapy today.  She is receiving carboplatin at AUC 4 due to ongoing anemia.  She will come back on day 2 for Neulasta support.  This will be her last cycle of chemotherapy and I did touch base with Dr. CiPeyton Najjarhat she can proceed with surgery in the next 2 to 4 weeks.  Anemia: Likely secondary to chemotherapy.  Iron studies B12 and folate are normal.  I will see her back in 10 days time with a repeat CBC for possible blood if her hemoglobin is less than 7.  Fluids next week and in 10 days.  Diarrhea: Likely secondary to Herceptin and Perjeta currently well controlled with Lomotil and Imodium.  Continue to monitor  Thrombocytopenia: Likely secondary to chemotherapy continue to monitor  Based on the results of final pathology at the time of surgery we  will decide if she can continue with Herceptin and Perjeta for 1 year versus switch to Kadcyla if she has residual disease.  She will also receive radiation treatment and hormone therapy after surgery.   Visit Diagnosis 1. Encounter for antineoplastic chemotherapy   2. Encounter for monoclonal antibody treatment for malignancy   3. Anemia due to  antineoplastic chemotherapy   4. Invasive carcinoma of breast ([place])   5. Chemotherapy-induced thrombocytopenia      Dr. Randa Evens, MD, MPH [facility] at [facility] [phone] 03/02/2020 10:17 AM

## 2020-03-07 ENCOUNTER — Inpatient Hospital Stay: Payer: Medicare Other

## 2020-03-07 ENCOUNTER — Ambulatory Visit
Admission: RE | Admit: 2020-03-07 | Discharge: 2020-03-07 | Disposition: A | Discharge status: Home / Self Care | Payer: Medicare Other | Source: Ambulatory Visit | Attending: Oncology | Admitting: Oncology

## 2020-03-07 ENCOUNTER — Other Ambulatory Visit: Payer: Self-pay

## 2020-03-07 ENCOUNTER — Inpatient Hospital Stay (HOSPITAL_BASED_OUTPATIENT_CLINIC_OR_DEPARTMENT_OTHER): Payer: Medicare Other | Admitting: Oncology

## 2020-03-07 VITALS — BP 149/67 | HR 80 | Temp 96.9°F | Resp 18

## 2020-03-07 DIAGNOSIS — C50919 Malignant neoplasm of unspecified site of unspecified female breast: Secondary | ICD-10-CM

## 2020-03-07 DIAGNOSIS — M25462 Effusion, left knee: Secondary | ICD-10-CM

## 2020-03-07 DIAGNOSIS — M25562 Pain in left knee: Secondary | ICD-10-CM | POA: Insufficient documentation

## 2020-03-07 DIAGNOSIS — Z5112 Encounter for antineoplastic immunotherapy: Secondary | ICD-10-CM | POA: Diagnosis not present

## 2020-03-07 MED ORDER — HEPARIN SOD (PORK) LOCK FLUSH 100 UNIT/ML IV SOLN
500.0000 [IU] | Freq: Once | INTRAVENOUS | Status: AC
  Administered 2020-03-07: 500 [IU] via INTRAVENOUS
  Filled 2020-03-07: qty 5

## 2020-03-07 MED ORDER — SODIUM CHLORIDE 0.9% FLUSH
10.0000 mL | INTRAVENOUS | Status: DC | PRN
  Administered 2020-03-07: 10 mL via INTRAVENOUS
  Filled 2020-03-07: qty 10

## 2020-03-07 MED ORDER — TRAMADOL HCL 50 MG PO TABS: 50.0000 mg | ORAL_TABLET | Freq: Four times a day (QID) | ORAL | 0 refills | Status: DC | PRN

## 2020-03-07 MED ORDER — SODIUM CHLORIDE 0.9 % IV SOLN
Freq: Once | INTRAVENOUS | Status: AC
  Administered 2020-03-07: 1000 mL via INTRAVENOUS
  Filled 2020-03-07: qty 250

## 2020-03-07 MED ORDER — HEPARIN SOD (PORK) LOCK FLUSH 100 UNIT/ML IV SOLN
INTRAVENOUS | Status: AC
  Filled 2020-03-07: qty 5

## 2020-03-07 NOTE — Progress Notes (Signed)
Symptom Management Consult note [facility]  [phone] [phone]  Patient Care Team: Kirk Ruths, MD as PCP - General (Internal Medicine) Theodore Demark, RN as Registered Nurse (Oncology) Sindy Guadeloupe, MD as Consulting Physician (Hematology and Oncology) Sindy Guadeloupe, MD as Consulting Physician (Hematology and Oncology) Sindy Guadeloupe, MD as Consulting Physician (Hematology and Oncology)   Name of the patient: Christina Mcclain  [id number]  Apr 14, 1956   Date of visit: 03/07/2020   Diagnosis-invasive mammary carcinoma of right breast  Chief complaint/ Reason for visit-Left knee pain  Heme/Onc history:  Oncology History Overview Note  patient is a 64 year old African-American female who underwent a routine screening mammogram on 10/06/2019. It showed 2 suspicious lesions in the right breast. Diagnostic mammogram and ultrasound confirmed 2 masses in the 9 o'clock position of the right breast measuring 1.1 x 1.2 x 1.2 cm and the other one measuring 0.8 x 0.8 x 0.9 cm. These 2 masses were 1.7 cm apart. Sonographic evaluation of the right axilla did not show any enlarged adenopathy. 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    Baseline MUGA scan showed EF of 58%.  MRI showed two distinct masses in the right breast 1.5 cm and 1.2 cm 2 cm apart no additional suspicious masses or abnormal enhancement identified.  No suspicious adenopathy   6 cycles of neoadjuvant TCHP chemotherapy completed on 02/29/2020.   Invasive carcinoma of breast ([place])  10/28/2019 Cancer Staging   Staging form: Breast, AJCC 8th Edition - Clinical stage from 10/28/2019: Stage IA (cT1c, cN0, cM0, G3, ER+, PR+, HER2+) - Signed by Sindy Guadeloupe, MD on 10/31/2019   10/31/2019 Initial Diagnosis   Invasive carcinoma of breast  ([place])   11/10/2019 -  Chemotherapy   The patient had palonosetron (ALOXI) injection 0.25 mg, 0.25 mg, Intravenous,  Once, 6 of 6 cycles Administration: 0.25 mg (11/10/2019), 0.25 mg (12/01/2019), 0.25 mg (02/09/2020), 0.25 mg (03/01/2020), 0.25 mg (12/29/2019), 0.25 mg (01/19/2020) pegfilgrastim (NEULASTA) injection 6 mg, 6 mg, Subcutaneous, Once, 3 of 3 cycles Administration: 6 mg (01/20/2020), 6 mg (02/10/2020), 6 mg (03/02/2020) pegfilgrastim (NEULASTA ONPRO KIT) injection 6 mg, 6 mg, Subcutaneous, Once, 3 of 3 cycles Administration: 6 mg (11/10/2019), 6 mg (12/01/2019), 6 mg (12/29/2019) CARBOplatin (PARAPLATIN) 630 mg in sodium chloride 0.9 % 250 mL chemo infusion, 630 mg (100 % of original dose 627.5 mg), Intravenous,  Once, 6 of 6 cycles Dose modification:   (original dose 627.5 mg, Cycle 1) Administration: 630 mg (11/10/2019), 540 mg (12/01/2019), 380 mg (02/09/2020), 340 mg (03/01/2020), 460 mg (12/29/2019), 460 mg (01/19/2020) DOCEtaxel (TAXOTERE) 150 mg in sodium chloride 0.9 % 250 mL chemo infusion, 75 mg/m2 = 150 mg, Intravenous,  Once, 6 of 6 cycles Dose modification: 60 mg/m2 (original dose 75 mg/m2, Cycle 5, Reason: Other (see comments), Comment: neutropenia) Administration: 150 mg (11/10/2019), 150 mg (12/01/2019), 120 mg (02/09/2020), 120 mg (03/01/2020), 120 mg (12/29/2019), 120 mg (01/19/2020) fosaprepitant (EMEND) 150 mg in sodium chloride 0.9 % 145 mL IVPB, 150 mg, Intravenous,  Once, 6 of 6 cycles Administration: 150 mg (11/10/2019), 150 mg (12/01/2019), 150 mg (02/09/2020), 150 mg (03/01/2020), 150 mg (12/29/2019), 150 mg (01/19/2020) pertuzumab (PERJETA) 840 mg in sodium chloride 0.9 % 250 mL chemo infusion, 840 mg, Intravenous, Once, 6 of 6 cycles Administration: 840 mg (11/10/2019), 420  mg (12/01/2019), 420 mg (02/09/2020), 420 mg (03/01/2020), 420 mg (12/29/2019), 420 mg (01/19/2020) trastuzumab-dkst (OGIVRI) 750 mg in sodium chloride 0.9 % 250 mL chemo infusion, 756 mg, Intravenous,  Once, 6 of 6  cycles Administration: 750 mg (11/10/2019), 600 mg (12/01/2019), 540 mg (02/09/2020), 540 mg (03/01/2020), 540 mg (12/29/2019), 540 mg (01/19/2020)  for chemotherapy treatment.      Interval history-Mrs. Sturdifenis a 64 year old female with above history who presents to symptom management for IV fluids and new onset left knee pain.  Knee pain is a result of an injury she sustained last Sunday while doing yard work.  She states she turned her foot wrong and twisted her left knee.  She did not lose her balance or fall.  She admits to a similar experience a few years back.  Since Sunday, she has been elevating her left leg, applying ice and using Tylenol for pain. She is asking to be evaluated while receiving her IV fluids.  Today, she states her left knee has not improved much since interventions. Pain is mostly when she bears weight. She has been using ice with elevation and has noted very minimal temporary relief.  She notes swelling from left knee extending down to her ankle.  She denies any calf pain.  She generally feels lousy since her treatment last week and continues to have intermittent diarrhea and nausea.  She uses Imodium on occasion.  She is worried about "overusing" Imodium and fear of constipation.  She has been receiving weekly IV fluids due to dehydration from fluid lost from her diarrhea.  Her treatment was dose reduced secondary to persistent anemia.  She denies any shortness of breath or chest pain.  ECOG FS:1 - Symptomatic but completely ambulatory  Review of systems- Review of Systems  Constitutional: Positive for malaise/fatigue. Negative for chills, fever and weight loss.  HENT: Negative for congestion, ear pain and tinnitus.   Eyes: Negative.  Negative for blurred vision and double vision.  Respiratory: Negative.  Negative for cough, sputum production and shortness of breath.   Cardiovascular: Negative.  Negative for chest pain, palpitations and leg swelling.  Gastrointestinal:  Positive for diarrhea and nausea. Negative for abdominal pain, constipation and vomiting.  Genitourinary: Negative for dysuria, frequency and urgency.  Musculoskeletal: Negative for back pain and falls.       Left knee pain  Skin: Negative.  Negative for rash.  Neurological: Negative.  Negative for weakness and headaches.  Endo/Heme/Allergies: Negative.  Does not bruise/bleed easily.  Psychiatric/Behavioral: Negative.  Negative for depression. The patient is not nervous/anxious and does not have insomnia.      Current treatment- S/p cycle 6 TCHP  Allergies  Allergen Reactions  . Statins     Other reaction(s): Muscle Pain Possible hives also     Past Medical History:  Diagnosis Date  . Adverse effect of anesthesia    hard to awaken  . Anemia   . Arthritis   . Asthma   . Asthma without status asthmaticus    unspecified; Take advair each night  . Breast cancer ([place])   . Bronchitis   . Constipation   . Diabetes ([place]) 2010  . Diabetes mellitus type 2, uncomplicated ([place])   . Diabetes mellitus without complication ([place])   . Encounter for blood transfusion    hysterectomy 9-10 years ago  . Heart murmur    unspecified  . Hyperlipidemia   . Hypertension 1991  . Hypothyroid   . Multiple thyroid nodules   .  Neuromuscular disorder ([facility])    neuropathy  . Osteoarthritis of right hip 02/04/2017  . Palpitations   . Pernicious anemia   . Pneumonia   . Sleep apnea    Use C-PAP; can't use mouth piece. Anxiety  . Thyroid disease    nodules  . Thyroid nodule    x2  . Urinary urgency      Past Surgical History:  Procedure Laterality Date  . ABDOMINAL HYSTERECTOMY  2011   partial  . ABDOMINAL SURGERY    . CARPAL TUNNEL RELEASE Left 2008  . CARPAL TUNNEL RELEASE Right   . CHOLECYSTECTOMY  1982  . COLONOSCOPY WITH PROPOFOL N/A 10/05/2017   Procedure: COLONOSCOPY WITH PROPOFOL;  Surgeon: Manya Silvas, MD;  Location: [facility] END[facility]COPY;  Service: Endoscopy;  Laterality: N/A;   . DIAGNOSTIC LAPAROSCOPY    . JOINT REPLACEMENT Right 02/09/2017   TOTAL HIP REPLACEMENT, DR. CARTER. [place]  . KNEE ARTHROSCOPY  1990s  . PORTACATH PLACEMENT Left 11/07/2019   Procedure: INSERTION PORT-A-CATH;  Surgeon: Herbert Pun, MD;  Location: [facility] ORS;  Service: General;  Laterality: Left;  . TOTAL HIP ARTHROPLASTY Left 06/23/2017   Procedure: TOTAL HIP ARTHROPLASTY ANTERIOR APPROACH;  Surgeon: Hessie Knows, MD;  Location: [facility] ORS;  Service: Orthopedics;  Laterality: Left;    Social History   Socioeconomic History  . Marital status: Married    Spouse name: Not on file  . Number of children: 2  . Years of education: some college  . Highest education level: Not on file  Occupational History  . Occupation: retired    Comment: [employer]  Tobacco Use  . Smoking status: Never Smoker  . Smokeless tobacco: Never Used  Substance and Sexual Activity  . Alcohol use: No  . Drug use: No  . Sexual activity: Not on file  Other Topics Concern  . Not on file  Social History Narrative  . Not on file   Social Determinants of Health   Financial Resource Strain:   . Difficulty of Paying Living Expenses:   Food Insecurity:   . Worried About Charity fundraiser in the Last Year:   . Arboriculturist in the Last Year:   Transportation Needs:   . Film/video editor (Medical):   Marland Kitchen Lack of Transportation (Non-Medical):   Physical Activity:   . Days of Exercise per Week:   . Minutes of Exercise per Session:   Stress:   . Feeling of Stress :   Social Connections:   . Frequency of Communication with Friends and Family:   . Frequency of Social Gatherings with Friends and Family:   . Attends Religious Services:   . Active Member of Clubs or Organizations:   . Attends Archivist Meetings:   Marland Kitchen Marital Status:   Intimate Partner Violence:   . Fear of Current or Ex-Partner:   . Emotionally Abused:   Marland Kitchen Physically Abused:   . Sexually Abused:      Family History  Problem Relation Age of Onset  . Heart disease Mother   . Hypertension Mother   . Arrhythmia Mother   . Heart attack Mother   . Diabetes Mother   . Hypertension Father   . Parkinson's disease Father   . Coronary artery disease Maternal Uncle   . Heart attack Maternal Uncle   . Diabetes Sister   . Diabetes Brother   . Heart disease Brother   . Breast cancer Neg Hx  Current Outpatient Medications:  .  acetaminophen (TYLENOL) 500 MG tablet, Take 500 mg by mouth every 6 (six) hours as needed., Disp: , Rfl:  .  albuterol (PROVENTIL HFA;VENTOLIN HFA) 108 (90 Base) MCG/ACT inhaler, Inhale 2 puffs into the lungs every 6 (six) hours as needed for wheezing or shortness of breath., Disp: , Rfl:  .  carvedilol (COREG) 12.5 MG tablet, Take 12.5 mg by mouth 2 (two) times daily. , Disp: , Rfl:  .  cholecalciferol (VITAMIN D) 1000 units tablet, Take 1,000 Units by mouth daily., Disp: , Rfl:  .  diphenoxylate-atropine (LOMOTIL) 2.5-0.025 MG tablet, Take 1 tablet by mouth 4 (four) times daily as needed for diarrhea or loose stools. (Patient not taking: Reported on 02/09/2020), Disp: 60 tablet, Rfl: 0 .  Fluticasone-Salmeterol (ADVAIR) 250-50 MCG/DOSE AEPB, Inhale 1 puff into the lungs 2 (two) times daily. , Disp: , Rfl:  .  gabapentin (NEURONTIN) 100 MG capsule, Take 200 mg by mouth 2 (two) times daily., Disp: , Rfl:  .  lidocaine-prilocaine (EMLA) cream, Apply to affected area once (Patient taking differently: Apply 1 application topically once. Apply to affected area once), Disp: 30 g, Rfl: 3 .  loperamide (IMODIUM) 2 MG capsule, Take 2 mg by mouth as needed for diarrhea or loose stools., Disp: , Rfl:  .  loratadine (CLARITIN) 10 MG tablet, Take 10 mg by mouth daily as needed for allergies., Disp: , Rfl:  .  LORazepam (ATIVAN) 0.5 MG tablet, Take 1 tablet (0.5 mg total) by mouth every 6 (six) hours as needed (Nausea or vomiting). (Patient not taking: Reported on 02/09/2020),  Disp: 30 tablet, Rfl: 0 .  metFORMIN (GLUCOPHAGE-XR) 500 MG 24 hr tablet, Take 500 mg by mouth daily., Disp: , Rfl:  .  ondansetron (ZOFRAN) 8 MG tablet, Take 1 tablet (8 mg total) by mouth 2 (two) times daily as needed (Nausea or vomiting). Begin 4 days after chemotherapy. (Patient not taking: Reported on 02/09/2020), Disp: 30 tablet, Rfl: 1 .  pantoprazole (PROTONIX) 20 MG tablet, Take 1 tablet (20 mg total) by mouth 2 (two) times daily before a meal., Disp: 60 tablet, Rfl: 2 .  potassium chloride (KLOR-CON) 20 MEQ packet, Take 20 mEq by mouth daily., Disp: 30 packet, Rfl: 0 .  prochlorperazine (COMPAZINE) 10 MG tablet, Take 1 tablet (10 mg total) by mouth every 6 (six) hours as needed (Nausea or vomiting). (Patient not taking: Reported on 01/19/2020), Disp: 30 tablet, Rfl: 1 .  solifenacin (VESICARE) 10 MG tablet, Take 10 mg by mouth daily. , Disp: , Rfl:  .  telmisartan-hydrochlorothiazide (MICARDIS HCT) 80-25 MG tablet, Take 1 tablet by mouth daily. , Disp: , Rfl:  .  triamcinolone cream (KENALOG) 0.5 %, Apply 1 application topically 2 (two) times daily. , Disp: , Rfl:  No current facility-administered medications for this visit.  Facility-Administered Medications Ordered in Other Visits:  .  sodium chloride flush (NS) 0.9 % injection 10 mL, 10 mL, Intravenous, PRN, Sindy Guadeloupe, MD, 10 mL at 01/05/20 1344  Physical exam: There were no vitals filed for this visit. Physical Exam Constitutional:      Appearance: Normal appearance.  HENT:     Head: Normocephalic and atraumatic.  Eyes:     Pupils: Pupils are equal, round, and reactive to light.  Cardiovascular:     Rate and Rhythm: Normal rate and regular rhythm.     Heart sounds: Normal heart sounds. No murmur.  Pulmonary:     Effort: Pulmonary effort is  normal.     Breath sounds: Normal breath sounds. No wheezing.  Abdominal:     General: Bowel sounds are normal. There is no distension.     Palpations: Abdomen is soft.      Tenderness: There is no abdominal tenderness.  Musculoskeletal:        General: Normal range of motion.     Cervical back: Normal range of motion.     Left knee: Swelling present.     Left lower leg: Swelling present.  Skin:    General: Skin is warm and dry.     Findings: No rash.  Neurological:     Mental Status: She is alert and oriented to person, place, and time.  Psychiatric:        Judgment: Judgment normal.      CMP Latest Ref Rng & Units 03/01/2020  Glucose 70 - 99 mg/dL 141(H)  BUN 8 - 23 mg/dL 28(H)  Creatinine 0.44 - 1.00 mg/dL 1.38(H)  Sodium 135 - 145 mmol/L 142  Potassium 3.5 - 5.1 mmol/L 4.3  Chloride 98 - 111 mmol/L 105  CO2 22 - 32 mmol/L 27  Calcium 8.9 - 10.3 mg/dL 9.3  Total Protein 6.5 - 8.1 g/dL 7.1  Total Bilirubin 0.3 - 1.2 mg/dL 0.5  Alkaline Phos 38 - 126 U/L 99  AST 15 - 41 U/L 16  ALT 0 - 44 U/L 13   CBC Latest Ref Rng & Units 03/01/2020  WBC 4.0 - 10.5 K/uL 5.6  Hemoglobin 12.0 - 15.0 g/dL 8.4(L)  Hematocrit 36.0 - 46.0 % 27.7(L)  Platelets 150 - 400 K/uL 133(L)    No images are attached to the encounter.  US Venous Img Lower Unilateral Left  Result Date: 03/07/2020 CLINICAL DATA:  Left knee and leg swelling. Twisting injury. On treatment for breast cancer. EXAM: LEFT LOWER EXTREMITY VENOUS DOPPLER ULTRASOUND TECHNIQUE: Gray-scale sonography with compression, as well as color and duplex ultrasound, were performed to evaluate the deep venous system(s) from the level of the common femoral vein through the popliteal and proximal calf veins. COMPARISON:  None. FINDINGS: VENOUS Normal compressibility of the common femoral, superficial femoral, and popliteal veins, as well as the visualized calf veins. Visualized portions of profunda femoral vein and great saphenous vein unremarkable. No filling defects to suggest DVT on grayscale or color Doppler imaging. Doppler waveforms show normal direction of venous flow, normal respiratory phasicity and response to  augmentation. Limited views of the contralateral common femoral vein are unremarkable. OTHER None. Limitations: none IMPRESSION: No femoropopliteal DVT nor evidence of DVT within the visualized calf veins. If clinical symptoms are inconsistent or if there are persistent or worsening symptoms, further imaging (possibly involving the iliac veins) may be warranted. Electronically Signed   By: Abigail Miyamoto M.D.   On: 03/07/2020 11:47   Assessment and plan- Patient is a 65 y.o. female who presents for left knee injury and swelling x4 days.  Left knee swelling is likely due to injury she sustained on Sunday afternoon while working in her yard.  Given her history and with ongoing chemotherapy I would like to rule out DVT.  Plan is to get an ultrasound of her left lower extremity.  If negative, will treat for sprain.   Ultrasound of left lower extremity was negative for DVT.  I would recommend RICE: Rest, ice, compression elevation for the next 2 to 3 days.  Per up-to-date, rest is achieved by limiting weightbearing activities such as using crutches or cane until  able to walk with normal gait.  Applying ice or cold water for 15 to 20 minutes every 2-3 hours to improve swelling.  An elastic bandage to minimize swelling should be applied and elevation above the level of the heart to further alleviate swelling.  She can also use NSAIDs for analgesia including Toradol and ibuprofen.   Plan: RICE therapy.  Can start ibuprofen and continue to take Tylenol. Ir persistent pain, can trial Tramadol 50 mg q 6 hours prn for pain.  If no improvement noted over the next few days, would recommend referral to orthopedics.  Disposition: RTC on 03/12/20 for labs, md assess and cycle 7.     Visit Diagnosis 1. Pain and swelling of left knee     Patient expressed understanding and was in agreement with this plan. She also understands that She can call clinic at any time with any questions, concerns, or complaints.    Greater than 50% was spent in counseling and coordination of care with this patient including but not limited to discussion of the relevant topics above (See A&P) including, but not limited to diagnosis and management of acute and chronic medical conditions.   Thank you for allowing me to participate in the care of this very pleasant patient.    Jacquelin Hawking, NP [place] [facility] Cell - [phone] Pager- [phone] 03/07/2020 12:38 PM

## 2020-03-09 ENCOUNTER — Other Ambulatory Visit: Payer: Self-pay | Admitting: *Deleted

## 2020-03-09 DIAGNOSIS — C50919 Malignant neoplasm of unspecified site of unspecified female breast: Secondary | ICD-10-CM

## 2020-03-12 ENCOUNTER — Inpatient Hospital Stay (HOSPITAL_BASED_OUTPATIENT_CLINIC_OR_DEPARTMENT_OTHER): Payer: Medicare Other | Admitting: Oncology

## 2020-03-12 ENCOUNTER — Encounter: Payer: Self-pay | Admitting: Oncology

## 2020-03-12 ENCOUNTER — Other Ambulatory Visit: Payer: Self-pay

## 2020-03-12 ENCOUNTER — Inpatient Hospital Stay: Payer: Medicare Other

## 2020-03-12 VITALS — Resp 18

## 2020-03-12 VITALS — BP 115/59 | HR 74 | Temp 97.1°F | Ht 62.0 in | Wt 180.0 lb

## 2020-03-12 DIAGNOSIS — K521 Toxic gastroenteritis and colitis: Secondary | ICD-10-CM | POA: Diagnosis not present

## 2020-03-12 DIAGNOSIS — R5383 Other fatigue: Secondary | ICD-10-CM

## 2020-03-12 DIAGNOSIS — E876 Hypokalemia: Secondary | ICD-10-CM

## 2020-03-12 DIAGNOSIS — Z95828 Presence of other vascular implants and grafts: Secondary | ICD-10-CM

## 2020-03-12 DIAGNOSIS — C50919 Malignant neoplasm of unspecified site of unspecified female breast: Secondary | ICD-10-CM

## 2020-03-12 DIAGNOSIS — R197 Diarrhea, unspecified: Secondary | ICD-10-CM

## 2020-03-12 DIAGNOSIS — D6481 Anemia due to antineoplastic chemotherapy: Secondary | ICD-10-CM | POA: Diagnosis not present

## 2020-03-12 DIAGNOSIS — N179 Acute kidney failure, unspecified: Secondary | ICD-10-CM

## 2020-03-12 DIAGNOSIS — Z5112 Encounter for antineoplastic immunotherapy: Secondary | ICD-10-CM | POA: Diagnosis not present

## 2020-03-12 DIAGNOSIS — T451X5A Adverse effect of antineoplastic and immunosuppressive drugs, initial encounter: Secondary | ICD-10-CM | POA: Diagnosis not present

## 2020-03-12 LAB — CBC WITH DIFFERENTIAL/PLATELET
Abs Immature Granulocytes: 0.28 10*3/uL — ABNORMAL HIGH (ref 0.00–0.07)
Basophils Absolute: 0 10*3/uL (ref 0.0–0.1)
Basophils Relative: 0 %
Eosinophils Absolute: 0 10*3/uL (ref 0.0–0.5)
Eosinophils Relative: 0 %
HCT: 27.6 % — ABNORMAL LOW (ref 36.0–46.0)
Hemoglobin: 8.5 g/dL — ABNORMAL LOW (ref 12.0–15.0)
Immature Granulocytes: 2 %
Lymphocytes Relative: 22 %
Lymphs Abs: 2.6 10*3/uL (ref 0.7–4.0)
MCH: 27.7 pg (ref 26.0–34.0)
MCHC: 30.8 g/dL (ref 30.0–36.0)
MCV: 89.9 fL (ref 80.0–100.0)
Monocytes Absolute: 1.1 10*3/uL — ABNORMAL HIGH (ref 0.1–1.0)
Monocytes Relative: 9 %
Neutro Abs: 7.9 10*3/uL — ABNORMAL HIGH (ref 1.7–7.7)
Neutrophils Relative %: 67 %
Platelets: 132 10*3/uL — ABNORMAL LOW (ref 150–400)
RBC: 3.07 MIL/uL — ABNORMAL LOW (ref 3.87–5.11)
RDW: 17.9 % — ABNORMAL HIGH (ref 11.5–15.5)
Smear Review: ADEQUATE
WBC: 12 10*3/uL — ABNORMAL HIGH (ref 4.0–10.5)
nRBC: 0.3 % — ABNORMAL HIGH (ref 0.0–0.2)

## 2020-03-12 LAB — SAMPLE TO BLOOD BANK

## 2020-03-12 LAB — COMPREHENSIVE METABOLIC PANEL
ALT: 16 U/L (ref 0–44)
AST: 22 U/L (ref 15–41)
Albumin: 4.2 g/dL (ref 3.5–5.0)
Alkaline Phosphatase: 108 U/L (ref 38–126)
Anion gap: 12 (ref 5–15)
BUN: 22 mg/dL (ref 8–23)
CO2: 30 mmol/L (ref 22–32)
Calcium: 8.8 mg/dL — ABNORMAL LOW (ref 8.9–10.3)
Chloride: 101 mmol/L (ref 98–111)
Creatinine, Ser: 1.74 mg/dL — ABNORMAL HIGH (ref 0.44–1.00)
GFR calc Af Amer: 35 mL/min — ABNORMAL LOW (ref >60)
GFR calc non Af Amer: 30 mL/min — ABNORMAL LOW (ref >60)
Glucose, Bld: 115 mg/dL — ABNORMAL HIGH (ref 70–99)
Potassium: 3.2 mmol/L — ABNORMAL LOW (ref 3.5–5.1)
Sodium: 143 mmol/L (ref 135–145)
Total Bilirubin: 0.4 mg/dL (ref 0.3–1.2)
Total Protein: 6.8 g/dL (ref 6.5–8.1)

## 2020-03-12 MED ORDER — SODIUM CHLORIDE 0.9% FLUSH
10.0000 mL | INTRAVENOUS | Status: DC | PRN
  Administered 2020-03-12: 13:00:00 10 mL via INTRAVENOUS
  Filled 2020-03-12: qty 10

## 2020-03-12 MED ORDER — SODIUM CHLORIDE 0.9 % IV SOLN
Freq: Once | INTRAVENOUS | Status: DC
  Filled 2020-03-12: qty 250

## 2020-03-12 MED ORDER — HEPARIN SOD (PORK) LOCK FLUSH 100 UNIT/ML IV SOLN
INTRAVENOUS | Status: AC
  Filled 2020-03-12: qty 5

## 2020-03-12 MED ORDER — SODIUM CHLORIDE 0.9 % IV SOLN: 10.0000 mg | Freq: Once | INTRAVENOUS | Status: DC

## 2020-03-12 MED ORDER — SODIUM CHLORIDE 0.9 % IV SOLN
Freq: Once | INTRAVENOUS | Status: AC
  Filled 2020-03-12: qty 1000

## 2020-03-12 MED ORDER — DEXAMETHASONE SODIUM PHOSPHATE 10 MG/ML IJ SOLN
10.0000 mg | Freq: Once | INTRAMUSCULAR | Status: AC
  Administered 2020-03-12: 10 mg via INTRAVENOUS
  Filled 2020-03-12: qty 1

## 2020-03-12 MED ORDER — SODIUM CHLORIDE 0.9% FLUSH
10.0000 mL | INTRAVENOUS | Status: DC | PRN
  Administered 2020-03-12: 10 mL via INTRAVENOUS
  Filled 2020-03-12: qty 10

## 2020-03-12 MED ORDER — LOPERAMIDE HCL 2 MG PO CAPS
2.0000 mg | ORAL_CAPSULE | Freq: Once | ORAL | Status: AC
  Administered 2020-03-12: 2 mg via ORAL
  Filled 2020-03-12: qty 1

## 2020-03-12 MED ORDER — HEPARIN SOD (PORK) LOCK FLUSH 100 UNIT/ML IV SOLN
500.0000 [IU] | Freq: Once | INTRAVENOUS | Status: AC
  Administered 2020-03-12: 500 [IU] via INTRAVENOUS
  Filled 2020-03-12: qty 5

## 2020-03-12 NOTE — Progress Notes (Signed)
Patient stated that she had been having nausea, diarrhea and poor appetite for the past 2 weeks. Patient stated that her fluid intake had been poor.

## 2020-03-14 ENCOUNTER — Other Ambulatory Visit: Payer: Self-pay | Admitting: *Deleted

## 2020-03-14 DIAGNOSIS — C50919 Malignant neoplasm of unspecified site of unspecified female breast: Secondary | ICD-10-CM

## 2020-03-14 DIAGNOSIS — E876 Hypokalemia: Secondary | ICD-10-CM

## 2020-03-14 DIAGNOSIS — R197 Diarrhea, unspecified: Secondary | ICD-10-CM

## 2020-03-15 ENCOUNTER — Inpatient Hospital Stay: Payer: Medicare Other

## 2020-03-15 ENCOUNTER — Other Ambulatory Visit: Payer: Self-pay | Admitting: General Surgery

## 2020-03-15 ENCOUNTER — Ambulatory Visit: Payer: Self-pay | Admitting: General Surgery

## 2020-03-15 ENCOUNTER — Other Ambulatory Visit: Payer: Self-pay

## 2020-03-15 VITALS — BP 158/83 | HR 64 | Temp 96.2°F | Resp 20

## 2020-03-15 DIAGNOSIS — C50919 Malignant neoplasm of unspecified site of unspecified female breast: Secondary | ICD-10-CM

## 2020-03-15 DIAGNOSIS — C50511 Malignant neoplasm of lower-outer quadrant of right female breast: Secondary | ICD-10-CM

## 2020-03-15 DIAGNOSIS — Z5112 Encounter for antineoplastic immunotherapy: Secondary | ICD-10-CM | POA: Diagnosis not present

## 2020-03-15 MED ORDER — SODIUM CHLORIDE 0.9 % IV SOLN
Freq: Once | INTRAVENOUS | Status: AC
  Filled 2020-03-15: qty 250

## 2020-03-15 MED ORDER — HEPARIN SOD (PORK) LOCK FLUSH 100 UNIT/ML IV SOLN
500.0000 [IU] | Freq: Once | INTRAVENOUS | Status: AC
  Administered 2020-03-15: 500 [IU] via INTRAVENOUS
  Filled 2020-03-15: qty 5

## 2020-03-15 MED ORDER — SODIUM CHLORIDE 0.9% FLUSH
10.0000 mL | Freq: Once | INTRAVENOUS | Status: AC
  Administered 2020-03-15: 11:00:00 10 mL via INTRAVENOUS
  Filled 2020-03-15: qty 10

## 2020-03-15 NOTE — H&P (View-Only) (Signed)
PATIENT PROFILE: Christina Mcclain is a 64 y.o. female who presents to the Clinic for evaluation of right breast cancer.  PCP:  Harrold Donath, MD  HISTORY OF PRESENT ILLNESS: [name] she has a regular yearly mammogram on October 06, 2019. This showed 2 suspicious lesions on the right breast. This led to diagnostic mammogram and ultrasound. The diagnostic mammogram and ultrasound confirmed a 2 masses at 9 o'clock position of the right breast. 1 mass is 7 cm from the nipple measuring 0.9 cm and the second 1 is 9 cm from the nipple measuring 1.2 cm. Both masses are 1.7 cm apart. 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  She was evaluated by medical oncology.  Due to her 2 areas of cancer more than 1 cm each being HER-2 positive, neoadjuvant chemotherapy was recommended.  Patient completed neoadjuvant therapy 2 weeks ago.  She reports that she had significant diarrhea and weakness during the course of the therapy.  Today feeling better.  She had an ultrasound on February showing decrease in size of the 2 lesions.  There is a sign that patient has been responding adequately to the neoadjuvant Chemotherapy.   PROBLEM LIST:        Problem List  Date Reviewed: 09/28/2019       Noted   Pelvic pressure in female Unknown   Tinnitus of both ears 08/03/2019   Nonintractable headache 08/03/2019   Bilateral hand pain 09/14/2018   Bilateral hand numbness 09/14/2018   Myalgia due to statin 08/26/2018   Neck pain 08/23/2018   Diabetic polyneuropathy associated with type 2 diabetes mellitus (CMS-HCC) 07/27/2018   BMI 40.0-44.9, adult (CMS-HCC) (Chronic) 12/16/2017   Primary localized osteoarthrosis of left hip 06/23/2017   Controlled type 2 diabetes mellitus with complication, without long-term current use of insulin (CMS-HCC) 03/17/2017   HTN, goal below 140/90 03/17/2017   Primary osteoarthritis  involving multiple joints 03/17/2017   Pernicious anemia 03/17/2017   Overview    Trying im q 4 mo and po daily 8-18      Healthcare maintenance 03/17/2017   Overview    Pneumovax 2016, mmg 3-18, post hysterectomy, optho 3-18, colon 11-18 with polyps, shingrix 5-19       Mild persistent asthma without complication 6/86/1683   Thyromegaly 07/23/2016   High cholesterol 11/14/2012   Osteoarthritis of right hip 01/08/2010      GENERAL REVIEW OF SYSTEMS:   General ROS: negative for - chills, fever, weight gain or weight loss.  Positive for fatigue Allergy and Immunology ROS: negative for - hives  Hematological and Lymphatic ROS: negative for - bleeding problems or bruising, negative for palpable nodes Endocrine ROS: negative for - heat or cold intolerance, hair changes Respiratory ROS: negative for - cough, shortness of breath or wheezing Cardiovascular ROS: no chest pain or palpitations GI ROS: negative for nausea, vomiting, abdominal pain, diarrhea, constipation Musculoskeletal ROS: negative for - joint swelling or muscle pain Neurological ROS: negative for - confusion, syncope Dermatological ROS: negative for pruritus and rash Psychiatric: negative for anxiety, depression, difficulty sleeping and memory loss  MEDICATIONS: Current Medications        Current Outpatient Medications  Medication Sig Dispense Refill  . albuterol 90 mcg/actuation inhaler Inhale 2 inhalations into the lungs every 4 (four) hours as needed 3 Inhaler 3  . carvediloL (COREG) 12.5 MG tablet Take 1 tablet (12.5 mg total) by mouth 2 (two) times daily with meals 180  tablet 0  . cholecalciferol (CHOLECALCIFEROL) 1,000 unit tablet Take 1,000 Units by mouth once daily.    Marland Kitchen dexAMETHasone (DECADRON) 4 MG tablet Take by mouth    . fluticasone propion-salmeteroL (ADVAIR DISKUS) 250-50 mcg/dose diskus inhaler Inhale 1 inhalation into the lungs every 12 (twelve) hours 180 Inhaler 3  . FREESTYLE  LANCETS lancets Inject 1 each into the skin 3 (three) times daily 300 each 3  . gabapentin (NEURONTIN) 100 MG capsule Take 2 capsules (200 mg total) by mouth 3 (three) times daily (Patient taking differently: Take 200 mg by mouth 2 (two) times daily   ) 270 capsule 3  . lidocaine-prilocaine (EMLA) cream Apply to affected area once    . LORazepam (ATIVAN) 0.5 MG tablet Take by mouth    . metFORMIN (GLUCOPHAGE-XR) 500 MG XR tablet Take 1 tablet (500 mg total) by mouth daily with dinner 90 tablet 1  . MICARDIS HCT 80-25 mg tablet TAKE 1 TABLET DAILY 90 tablet 3  . prochlorperazine (COMPAZINE) 10 MG tablet Take by mouth    . solifenacin (VESICARE) 5 MG tablet Take 2 tablets (10 mg total) by mouth once daily 90 tablet 4   No current facility-administered medications for this visit.      ALLERGIES: Phenylephrine-guaifenesin and Statins-hmg-coa reductase inhibitors  PAST MEDICAL HISTORY:     Past Medical History:  Diagnosis Date  . Asthma without status asthmaticus, unspecified    Take advair each night  . Breast cancer (CMS-HCC) 12/2019   breast  . Diabetes mellitus type 2, uncomplicated (CMS-HCC)   . Encounter for blood transfusion    Hysterotomy 9-10 yrs ago  . Heart murmur, unspecified   . High cholesterol 11/14/2012  . HTN, goal below 140/90 03/17/2017  . Hypertension   . Mild persistent asthma without complication 9/79/4801  . Osteoarthritis   . Pernicious anemia   . Sleep apnea    Can't use mouth piece. Anxiety  . 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 Thyroid nodule    x2    PAST SURGICAL HISTORY:      Past Surgical History:  Procedure Laterality Date  . ARTHROPLASTY HIP TOTAL Right 02/09/2017   done in [place]  . CHOLECYSTECTOMY     1980's  . COLONOSCOPY  10/02/2014   Adenomatous Polyps  . COLONOSCOPY  10/05/2017   Adenomatous Polyps: CBF 09/2022  . ENDOSCOPIC CARPAL TUNNEL RELEASE Left 2008  . HYSTERECTOMY     partial   . JOINT REPLACEMENT Right 02/09/2017   Right THA- Done in [place]  . KNEE ARTHROSCOPY     1990's  . Total hip arthroplasty anterior approach Left 06/23/2017   Dr.Menz     FAMILY HISTORY:      Family History  Problem Relation Age of Onset  . No Known Problems Mother   . No Known Problems Father   . High blood pressure (Hypertension) Sister   . High blood pressure (Hypertension) Brother   . Hyperlipidemia (Elevated cholesterol) Brother      SOCIAL HISTORY: Social History          Socioeconomic History  . Marital status: Married    Spouse name: Eloisa Northern. [place]  . Number of children: 2  . Years of education: 51  . Highest education level: Not on file  Occupational History  . Not on file  Tobacco Use  . Smoking status: Never Smoker  . Smokeless tobacco: Never Used  Vaping Use  . Vaping Use: Never used  Substance and Sexual  Activity  . Alcohol use: No  . Drug use: No  . Sexual activity: Not Currently    Partners: Male    Birth control/protection: None  Other Topics Concern  . Would you please tell us about the people who live in your home, your pets, or anything else important to your social life? Not Asked  Social History Narrative  . Not on file   Social Determinants of Health      Financial Resource Strain:   . Difficulty of Paying Living Expenses:   Food Insecurity:   . Worried About Charity fundraiser in the Last Year:   . Arboriculturist in the Last Year:   Transportation Needs:   . Film/video editor (Medical):   Marland Kitchen Lack of Transportation (Non-Medical):       PHYSICAL EXAM:    Vitals:   03/15/20 0921  BP: 143/77  Pulse: 75   Body mass index is 38.23 kg/m. Weight: 94.8 kg (209 lb)   GENERAL: Alert, active, oriented x3  HEENT: Pupils equal reactive to light. Extraocular movements are intact. Sclera clear. Palpebral conjunctiva normal red color.Pharynx clear.  NECK: Supple with no palpable mass and no  adenopathy.  LUNGS: Sound clear with no rales rhonchi or wheezes.  HEART: Regular rhythm S1 and S2 without murmur.  BREAST: Both breasts were examined in the sitting and supine position.  There is no palpable mass, no skin changes, no nipple discharge or retraction.  No axillary adenopathy identified.  ABDOMEN: Soft and depressible, nontender with no palpable mass, no hepatomegaly.   EXTREMITIES: Well-developed well-nourished symmetrical with no dependent edema.  NEUROLOGICAL: Awake alert oriented, facial expression symmetrical, moving all extremities.  REVIEW OF DATA: I have reviewed the following data today:      No visits with results within 3 Month(s) from this visit.  Latest known visit with results is:  Office Visit on 11/02/2019  Component Date Value  . Color 11/02/2019 Yellow   . Clarity 11/02/2019 Clear   . Specific Gravity 11/02/2019 1.020   . pH, Urine 11/02/2019 6.0   . Protein, Urinalysis 11/02/2019 Negative   . Glucose, Urinalysis 11/02/2019 Negative   . Ketones, Urinalysis 11/02/2019 Negative   . Blood, Urinalysis 11/02/2019 Negative   . Nitrite, Urinalysis 11/02/2019 Negative   . Leukocyte Esterase, Urin* 11/02/2019 Negative   . White Blood Cells, Urina* 11/02/2019 0-3   . Red Blood Cells, Urinaly* 11/02/2019 None Seen   . Bacteria, Urinalysis 11/02/2019 Rare*  . Squamous Epithelial Cell* 11/02/2019 Rare   . Urine Culture, Routine -* 11/02/2019 Final report   . Result 1 - [facility] 11/02/2019 Comment      ASSESSMENT: Ms. Mickel is a 64 y.o. female presenting for surgical evaluation for breast cancer.    Patient with known right breast cancer with 2 separate lesions of 1 cm each that were invasive mammary carcinoma ER/PR/HER-2/neu positive.  Patient completed neoadjuvant chemotherapy guided by medical oncology.  Patient has tolerated the chemotherapy well with side effects such as diarrhea and weakness.  Ultrasound done on February 2021 shows some  decrease in size of the masses.  I personally evaluated the images.  Patient completed neoadjuvant chemotherapy 2 weeks ago.  Today comes for evaluation of surgical management.  I discussed with the patient the alternative of partial mastectomy with sentinel needle biopsy.  Patient will need to wires to identify both areas of concern.  Discussed with patient the possible risks of surgery that includes pain, infection, bleeding, hematoma,  seroma, superficial thrombophlebitis and cosmetic issues.  She reports she understood and agreed to proceed.  Malignant neoplasm of lower-outer quadrant of right breast of female, estrogen receptor positive (CMS-HCC) [C50.511, Z17.0]  PLAN: 1. Needle guided partial mastectomy (right breast) with sentinel lymph node biopsy (19301, 38525) 2. CBC, CMP (done) 3. Avoid taking aspirin 5 days before surgery 4. Contact us if has any question or concern.   Patient and her husband verbalized understanding, all questions were answered, and were agreeable with the plan outlined above.   Herbert Pun, MD  Electronically signed by Herbert Pun, MD

## 2020-03-15 NOTE — H&P (Signed)
PATIENT PROFILE: Christina Mcclain is a 64 y.o. female who presents to the Clinic for evaluation of right breast cancer.  PCP:  Christina Donath, MD  HISTORY OF PRESENT ILLNESS: Christina Mcclain she has a regular yearly mammogram on October 06, 2019. This showed 2 suspicious lesions on the right breast. This led to diagnostic mammogram and ultrasound. The diagnostic mammogram and ultrasound confirmed a 2 masses at 9 o'clock position of the right breast. 1 mass is 7 cm from the nipple measuring 0.9 cm and the second 1 is 9 cm from the nipple measuring 1.2 cm. Both masses are 1.7 cm apart. 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  She was evaluated by medical oncology.  Due to her 2 areas of cancer more than 1 cm each being HER-2 positive, neoadjuvant chemotherapy was recommended.  Patient completed neoadjuvant therapy 2 weeks ago.  She reports that she had significant diarrhea and weakness during the course of the therapy.  Today feeling better.  She had an ultrasound on February showing decrease in size of the 2 lesions.  There is a sign that patient has been responding adequately to the neoadjuvant Chemotherapy.   PROBLEM LIST:        Problem List  Date Reviewed: 09/28/2019       Noted   Pelvic pressure in female Unknown   Tinnitus of both ears 08/03/2019   Nonintractable headache 08/03/2019   Bilateral hand pain 09/14/2018   Bilateral hand numbness 09/14/2018   Myalgia due to statin 08/26/2018   Neck pain 08/23/2018   Diabetic polyneuropathy associated with type 2 diabetes mellitus (CMS-HCC) 07/27/2018   BMI 40.0-44.9, adult (CMS-HCC) (Chronic) 12/16/2017   Primary localized osteoarthrosis of left hip 06/23/2017   Controlled type 2 diabetes mellitus with complication, without long-term current use of insulin (CMS-HCC) 03/17/2017   HTN, goal below 140/90 03/17/2017   Primary osteoarthritis  involving multiple joints 03/17/2017   Pernicious anemia 03/17/2017   Overview    Trying im q 4 mo and po daily 8-18      Healthcare maintenance 03/17/2017   Overview    Pneumovax 2016, mmg 3-18, post hysterectomy, optho 3-18, colon 11-18 with polyps, shingrix 5-19       Mild persistent asthma without complication 02/14/4009   Thyromegaly 07/23/2016   High cholesterol 11/14/2012   Osteoarthritis of right hip 01/08/2010      GENERAL REVIEW OF SYSTEMS:   General ROS: negative for - chills, fever, weight gain or weight loss.  Positive for fatigue Allergy and Immunology ROS: negative for - hives  Hematological and Lymphatic ROS: negative for - bleeding problems or bruising, negative for palpable nodes Endocrine ROS: negative for - heat or cold intolerance, hair changes Respiratory ROS: negative for - cough, shortness of breath or wheezing Cardiovascular ROS: no chest pain or palpitations GI ROS: negative for nausea, vomiting, abdominal pain, diarrhea, constipation Musculoskeletal ROS: negative for - joint swelling or muscle pain Neurological ROS: negative for - confusion, syncope Dermatological ROS: negative for pruritus and rash Psychiatric: negative for anxiety, depression, difficulty sleeping and memory loss  MEDICATIONS: Current Medications        Current Outpatient Medications  Medication Sig Dispense Refill  . albuterol 90 mcg/actuation inhaler Inhale 2 inhalations into the lungs every 4 (four) hours as needed 3 Inhaler 3  . carvediloL (COREG) 12.5 MG tablet Take 1 tablet (12.5 mg total) by mouth 2 (two) times daily with meals 180  tablet 0  . cholecalciferol (CHOLECALCIFEROL) 1,000 unit tablet Take 1,000 Units by mouth once daily.    Marland Kitchen dexAMETHasone (DECADRON) 4 MG tablet Take by mouth    . fluticasone propion-salmeteroL (ADVAIR DISKUS) 250-50 mcg/dose diskus inhaler Inhale 1 inhalation into the lungs every 12 (twelve) hours 180 Inhaler 3  . FREESTYLE  LANCETS lancets Inject 1 each into the skin 3 (three) times daily 300 each 3  . gabapentin (NEURONTIN) 100 MG capsule Take 2 capsules (200 mg total) by mouth 3 (three) times daily (Patient taking differently: Take 200 mg by mouth 2 (two) times daily   ) 270 capsule 3  . lidocaine-prilocaine (EMLA) cream Apply to affected area once    . LORazepam (ATIVAN) 0.5 MG tablet Take by mouth    . metFORMIN (GLUCOPHAGE-XR) 500 MG XR tablet Take 1 tablet (500 mg total) by mouth daily with dinner 90 tablet 1  . MICARDIS HCT 80-25 mg tablet TAKE 1 TABLET DAILY 90 tablet 3  . prochlorperazine (COMPAZINE) 10 MG tablet Take by mouth    . solifenacin (VESICARE) 5 MG tablet Take 2 tablets (10 mg total) by mouth once daily 90 tablet 4   No current facility-administered medications for this visit.      ALLERGIES: Phenylephrine-guaifenesin and Statins-hmg-coa reductase inhibitors  PAST MEDICAL HISTORY:     Past Medical History:  Diagnosis Date  . Asthma without status asthmaticus, unspecified    Take advair each night  . Breast cancer (CMS-HCC) 12/2019   breast  . Diabetes mellitus type 2, uncomplicated (CMS-HCC)   . Encounter for blood transfusion    Hysterotomy 9-10 yrs ago  . Heart murmur, unspecified   . High cholesterol 11/14/2012  . HTN, goal below 140/90 03/17/2017  . Hypertension   . Mild persistent asthma without complication 2/76/1470  . Osteoarthritis   . Pernicious anemia   . Sleep apnea    Can't use mouth piece. Anxiety  . 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 Thyroid nodule    x2    PAST SURGICAL HISTORY:      Past Surgical History:  Procedure Laterality Date  . ARTHROPLASTY HIP TOTAL Right 02/09/2017   done in [place]  . CHOLECYSTECTOMY     1980's  . COLONOSCOPY  10/02/2014   Adenomatous Polyps  . COLONOSCOPY  10/05/2017   Adenomatous Polyps: CBF 09/2022  . ENDOSCOPIC CARPAL TUNNEL RELEASE Left 2008  . HYSTERECTOMY     partial   . JOINT REPLACEMENT Right 02/09/2017   Right THA- Done in [place]  . KNEE ARTHROSCOPY     1990's  . Total hip arthroplasty anterior approach Left 06/23/2017   Dr.Menz     FAMILY HISTORY:      Family History  Problem Relation Age of Onset  . No Known Problems Mother   . No Known Problems Father   . High blood pressure (Hypertension) Sister   . High blood pressure (Hypertension) Brother   . Hyperlipidemia (Elevated cholesterol) Brother      SOCIAL HISTORY: Social History          Socioeconomic History  . Marital status: Married    Spouse name: Eloisa Northern. [place]  . Number of children: 2  . Years of education: 9  . Highest education level: Not on file  Occupational History  . Not on file  Tobacco Use  . Smoking status: Never Smoker  . Smokeless tobacco: Never Used  Vaping Use  . Vaping Use: Never used  Substance and Sexual  Activity  . Alcohol use: No  . Drug use: No  . Sexual activity: Not Currently    Partners: Male    Birth control/protection: None  Other Topics Concern  . Would you please tell us about the people who live in your home, your pets, or anything else important to your social life? Not Asked  Social History Narrative  . Not on file   Social Determinants of Health      Financial Resource Strain:   . Difficulty of Paying Living Expenses:   Food Insecurity:   . Worried About Charity fundraiser in the Last Year:   . Arboriculturist in the Last Year:   Transportation Needs:   . Film/video editor (Medical):   Marland Kitchen Lack of Transportation (Non-Medical):       PHYSICAL EXAM:    Vitals:   03/15/20 0921  BP: 143/77  Pulse: 75   Body mass index is 38.23 kg/m. Weight: 94.8 kg (209 lb)   GENERAL: Alert, active, oriented x3  HEENT: Pupils equal reactive to light. Extraocular movements are intact. Sclera clear. Palpebral conjunctiva normal red color.Pharynx clear.  NECK: Supple with no palpable mass and no  adenopathy.  LUNGS: Sound clear with no rales rhonchi or wheezes.  HEART: Regular rhythm S1 and S2 without murmur.  BREAST: Both breasts were examined in the sitting and supine position.  There is no palpable mass, no skin changes, no nipple discharge or retraction.  No axillary adenopathy identified.  ABDOMEN: Soft and depressible, nontender with no palpable mass, no hepatomegaly.   EXTREMITIES: Well-developed well-nourished symmetrical with no dependent edema.  NEUROLOGICAL: Awake alert oriented, facial expression symmetrical, moving all extremities.  REVIEW OF DATA: I have reviewed the following data today:      No visits with results within 3 Month(s) from this visit.  Latest known visit with results is:  Office Visit on 11/02/2019  Component Date Value  . Color 11/02/2019 Yellow   . Clarity 11/02/2019 Clear   . Specific Gravity 11/02/2019 1.020   . pH, Urine 11/02/2019 6.0   . Protein, Urinalysis 11/02/2019 Negative   . Glucose, Urinalysis 11/02/2019 Negative   . Ketones, Urinalysis 11/02/2019 Negative   . Blood, Urinalysis 11/02/2019 Negative   . Nitrite, Urinalysis 11/02/2019 Negative   . Leukocyte Esterase, Urin* 11/02/2019 Negative   . White Blood Cells, Urina* 11/02/2019 0-3   . Red Blood Cells, Urinaly* 11/02/2019 None Seen   . Bacteria, Urinalysis 11/02/2019 Rare*  . Squamous Epithelial Cell* 11/02/2019 Rare   . Urine Culture, Routine -* 11/02/2019 Final report   . Result 1 - [facility] 11/02/2019 Comment      ASSESSMENT: Ms. Larabee is a 64 y.o. female presenting for surgical evaluation for breast cancer.    Patient with known right breast cancer with 2 separate lesions of 1 cm each that were invasive mammary carcinoma ER/PR/HER-2/neu positive.  Patient completed neoadjuvant chemotherapy guided by medical oncology.  Patient has tolerated the chemotherapy well with side effects such as diarrhea and weakness.  Ultrasound done on February 2021 shows some  decrease in size of the masses.  I personally evaluated the images.  Patient completed neoadjuvant chemotherapy 2 weeks ago.  Today comes for evaluation of surgical management.  I discussed with the patient the alternative of partial mastectomy with sentinel needle biopsy.  Patient will need to wires to identify both areas of concern.  Discussed with patient the possible risks of surgery that includes pain, infection, bleeding, hematoma,  seroma, superficial thrombophlebitis and cosmetic issues.  She reports she understood and agreed to proceed.  Malignant neoplasm of lower-outer quadrant of right breast of female, estrogen receptor positive (CMS-HCC) [C50.511, Z17.0]  PLAN: 1. Needle guided partial mastectomy (right breast) with sentinel lymph node biopsy (19301, [id number]) 2. CBC, CMP (done) 3. Avoid taking aspirin 5 days before surgery 4. Contact us if has any question or concern.   Patient and her husband verbalized understanding, all questions were answered, and were agreeable with the plan outlined above.   Herbert Pun, MD  Electronically signed by Herbert Pun, MD

## 2020-03-15 NOTE — Progress Notes (Signed)
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 It showed 2 suspicious lesions in the right breast. Diagnostic mammogram and ultrasound confirmed 2 masses in the 9 o'clock position of the right breast measuring 1.1 x 1.2 x 1.2 cm and the other one measuring 0.8 x 0.8 x 0.9 cm. These 2 masses were 1.7 cm apart. Sonographic evaluation of the right axilla did not show any enlarged adenopathy. 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  Baseline MUGA scan showed EF of 58%. MRI showed two distinct masses in the right breast 1.5 cm and 1.2 cm 2 cm apart no additional suspicious masses or abnormal enhancement identified. No suspicious adenopathy  6 cycles of neoadjuvant TCHP chemotherapy completed on 02/29/2020.  Patient will be seeing Dr. Peyton Najjar with plans for surgery soon  Interval history-patient feels fatigued.  She still has some on and off diarrhea which is relatively well controlled with  Imodium.  There are some good days and bad days.  Denies any significant tingling numbness in her hands and feet.  She has bilateral nail discoloration.  Appetite has been poor for the last 2 weeks. ECOG PS- 1 Pain scale- 0   Review of systems- Review of Systems  Constitutional: Positive for malaise/fatigue. Negative for chills, fever and weight loss.       Loss of appetite  HENT: Negative for congestion, ear discharge and nosebleeds.   Eyes: Negative for blurred vision.  Respiratory: Negative for cough, hemoptysis, sputum production, shortness of breath and wheezing.   Cardiovascular: Negative for chest pain, palpitations, orthopnea and claudication.  Gastrointestinal: Positive for diarrhea and nausea. Negative for abdominal pain, blood in stool, constipation, heartburn, melena and vomiting.  Genitourinary: Negative for dysuria, flank pain, frequency, hematuria and urgency.  Musculoskeletal: Negative for back pain, joint pain and myalgias.  Skin: Negative for rash.  Neurological: Negative for dizziness, tingling, focal weakness, seizures, weakness and headaches.  Endo/Heme/Allergies: Does not bruise/bleed easily.  Psychiatric/Behavioral: Negative for depression and suicidal ideas. The patient does not have insomnia.       Allergies  Allergen Reactions  . Statins     Other reaction(s): Muscle Pain Possible hives also     Past Medical History:  Diagnosis Date  . Adverse effect of anesthesia    hard to awaken  . Anemia   . Arthritis   . Asthma   . Asthma without status asthmaticus    unspecified; Take advair each night  . Breast cancer ([place])   . Bronchitis   . Constipation   . Diabetes ([place]) 2010  . Diabetes mellitus type 2, uncomplicated ([place])   .  Diabetes mellitus without complication ([place])   . Encounter for blood transfusion    hysterectomy 9-10 years ago  . Heart murmur    unspecified  . Hyperlipidemia   . Hypertension 1991  . Hypothyroid   . Multiple thyroid  nodules   . Neuromuscular disorder ([facility])    neuropathy  . Osteoarthritis of right hip 02/04/2017  . Palpitations   . Pernicious anemia   . Pneumonia   . Sleep apnea    Use C-PAP; can't use mouth piece. Anxiety  . Thyroid disease    nodules  . Thyroid nodule    x2  . Urinary urgency      Past Surgical History:  Procedure Laterality Date  . ABDOMINAL HYSTERECTOMY  2011   partial  . ABDOMINAL SURGERY    . CARPAL TUNNEL RELEASE Left 2008  . CARPAL TUNNEL RELEASE Right   . CHOLECYSTECTOMY  1982  . COLONOSCOPY WITH PROPOFOL N/A 10/05/2017   Procedure: COLONOSCOPY WITH PROPOFOL;  Surgeon: Manya Silvas, MD;  Location: [facility] ENDOSCOPY;  Service: Endoscopy;  Laterality: N/A;  . DIAGNOSTIC LAPAROSCOPY    . JOINT REPLACEMENT Right 02/09/2017   TOTAL HIP REPLACEMENT, DR. CAR[place]. [place]  . KNEE ARTHROSCOPY  1990s  . PORTACATH PLACEMENT Left 11/07/2019   Procedure: INSERTION PORT-A-CATH;  Surgeon: Herbert Pun, MD;  Location: [facility] ORS;  Service: General;  Laterality: Left;  . TOTAL HIP ARTHROPLASTY Left 06/23/2017   Procedure: TOTAL HIP ARTHROPLASTY ANTERIOR APPROACH;  Surgeon: Hessie Knows, MD;  Location: [facility] ORS;  Service: Orthopedics;  Laterality: Left;    Social History   Socioeconomic History  . Marital status: Married    Spouse name: Not on file  . Number of children: 2  . Years of education: some college  . Highest education level: Not on file  Occupational History  . Occupation: retired    Comment: [employer]  Tobacco Use  . Smoking status: Never Smoker  . Smokeless tobacco: Never Used  Substance and Sexual Activity  . Alcohol use: No  . Drug use: No  . Sexual activity: Not on file  Other Topics Concern  . Not on file  Social History Narrative  . Not on file   Social Determinants of Health   Financial Resource Strain:   . Difficulty of Paying Living Expenses:   Food Insecurity:   . Worried About Charity fundraiser in the Last  Year:   . Arboriculturist in the Last Year:   Transportation Needs:   . Film/video editor (Medical):   Marland Kitchen Lack of Transportation (Non-Medical):   Physical Activity:   . Days of Exercise per Week:   . Minutes of Exercise per Session:   Stress:   . Feeling of Stress :   Social Connections:   . Frequency of Communication with Friends and Family:   . Frequency of Social Gatherings with Friends and Family:   . Attends Religious Services:   . Active Member of Clubs or Organizations:   . Attends Archivist Meetings:   Marland Kitchen Marital Status:   Intimate Partner Violence:   . Fear of Current or Ex-Partner:   . Emotionally Abused:   Marland Kitchen Physically Abused:   . Sexually Abused:     Family History  Problem Relation Age of Onset  . Heart disease Mother   . Hypertension Mother   . Arrhythmia Mother   . Heart attack Mother   . Diabetes Mother   . Hypertension Father   .  Parkinson's disease Father   . Coronary artery disease Maternal Uncle   . Heart attack Maternal Uncle   . Diabetes Sister   . Diabetes Brother   . Heart disease Brother   . Breast cancer Neg Hx      Current Outpatient Medications:  .  albuterol (PROVENTIL HFA;VENTOLIN HFA) 108 (90 Base) MCG/ACT inhaler, Inhale 2 puffs into the lungs every 6 (six) hours as needed for wheezing or shortness of breath., Disp: , Rfl:  .  carvedilol (COREG) 12.5 MG tablet, Take 12.5 mg by mouth 2 (two) times daily. , Disp: , Rfl:  .  cholecalciferol (VITAMIN D) 1000 units tablet, Take 1,000 Units by mouth daily., Disp: , Rfl:  .  diphenoxylate-atropine (LOMOTIL) 2.5-0.025 MG tablet, Take 1 tablet by mouth 4 (four) times daily as needed for diarrhea or loose stools., Disp: 60 tablet, Rfl: 0 .  gabapentin (NEURONTIN) 100 MG capsule, Take 200 mg by mouth 2 (two) times daily., Disp: , Rfl:  .  lidocaine-prilocaine (EMLA) cream, Apply to affected area once (Patient taking differently: Apply 1 application topically once. Apply to affected  area once), Disp: 30 g, Rfl: 3 .  loperamide (IMODIUM) 2 MG capsule, Take 2 mg by mouth as needed for diarrhea or loose stools., Disp: , Rfl:  .  loratadine (CLARITIN) 10 MG tablet, Take 10 mg by mouth daily as needed for allergies., Disp: , Rfl:  .  metFORMIN (GLUCOPHAGE-XR) 500 MG 24 hr tablet, Take 500 mg by mouth daily., Disp: , Rfl:  .  pantoprazole (PROTONIX) 20 MG tablet, Take 1 tablet (20 mg total) by mouth 2 (two) times daily before a meal., Disp: 60 tablet, Rfl: 2 .  potassium chloride (KLOR-CON) 20 MEQ packet, Take 20 mEq by mouth daily., Disp: 30 packet, Rfl: 0 .  prochlorperazine (COMPAZINE) 10 MG tablet, Take 1 tablet (10 mg total) by mouth every 6 (six) hours as needed (Nausea or vomiting)., Disp: 30 tablet, Rfl: 1 .  solifenacin (VESICARE) 10 MG tablet, Take 10 mg by mouth daily. , Disp: , Rfl:  .  telmisartan-hydrochlorothiazide (MICARDIS HCT) 80-25 MG tablet, Take 1 tablet by mouth daily. , Disp: , Rfl:  .  triamcinolone cream (KENALOG) 0.5 %, Apply 1 application topically 2 (two) times daily. , Disp: , Rfl:  .  acetaminophen (TYLENOL) 500 MG tablet, Take 500 mg by mouth every 6 (six) hours as needed., Disp: , Rfl:  .  Fluticasone-Salmeterol (ADVAIR) 250-50 MCG/DOSE AEPB, Inhale 1 puff into the lungs 2 (two) times daily. , Disp: , Rfl:  .  LORazepam (ATIVAN) 0.5 MG tablet, Take 1 tablet (0.5 mg total) by mouth every 6 (six) hours as needed (Nausea or vomiting). (Patient not taking: Reported on 03/12/2020), Disp: 30 tablet, Rfl: 0 .  ondansetron (ZOFRAN) 8 MG tablet, Take 1 tablet (8 mg total) by mouth 2 (two) times daily as needed (Nausea or vomiting). Begin 4 days after chemotherapy. (Patient not taking: Reported on 03/12/2020), Disp: 30 tablet, Rfl: 1 .  traMADol (ULTRAM) 50 MG tablet, Take 1 tablet (50 mg total) by mouth every 6 (six) hours as needed. (Patient not taking: Reported on 03/12/2020), Disp: 30 tablet, Rfl: 0 No current facility-administered medications for this  visit.  Facility-Administered Medications Ordered in Other Visits:  .  sodium chloride flush (NS) 0.9 % injection 10 mL, 10 mL, Intravenous, PRN, Sindy Guadeloupe, MD, 10 mL at 01/05/20 1344  Physical exam:  Vitals:   03/12/20 1024  BP: (!) 115/59  Pulse:  74  Temp: (!) 97.1 F (36.2 C)  TempSrc: Tympanic  SpO2: 100%  Weight: 180 lb (81.6 kg)  Height: 5' 2"  (1.575 m)   Physical Exam Constitutional:      Comments: Appears fatigued  Cardiovascular:     Rate and Rhythm: Normal rate and regular rhythm.     Heart sounds: Normal heart sounds.  Pulmonary:     Effort: Pulmonary effort is normal.     Breath sounds: Normal breath sounds.  Abdominal:     General: Bowel sounds are normal.     Palpations: Abdomen is soft.  Skin:    General: Skin is warm and dry.     Comments: Bilateral nail hyperpigmentation  Neurological:     Mental Status: She is alert and oriented to person, place, and time.      CMP Latest Ref Rng & Units 03/12/2020  Glucose 70 - 99 mg/dL 115(H)  BUN 8 - 23 mg/dL 22  Creatinine 0.44 - 1.00 mg/dL 1.74(H)  Sodium 135 - 145 mmol/L 143  Potassium 3.5 - 5.1 mmol/L 3.2(L)  Chloride 98 - 111 mmol/L 101  CO2 22 - 32 mmol/L 30  Calcium 8.9 - 10.3 mg/dL 8.8(L)  Total Protein 6.5 - 8.1 g/dL 6.8  Total Bilirubin 0.3 - 1.2 mg/dL 0.4  Alkaline Phos 38 - 126 U/L 108  AST 15 - 41 U/L 22  ALT 0 - 44 U/L 16   CBC Latest Ref Rng & Units 03/12/2020  WBC 4.0 - 10.5 K/uL 12.0(H)  Hemoglobin 12.0 - 15.0 g/dL 8.5(L)  Hematocrit 36.0 - 46.0 % 27.6(L)  Platelets 150 - 400 K/uL 132(L)    No images are attached to the encounter.  US Venous Img Lower Unilateral Left  Result Date: 03/07/2020 CLINICAL DATA:  Left knee and leg swelling. Twisting injury. On treatment for breast cancer. EXAM: LEFT LOWER EXTREMITY VENOUS DOPPLER ULTRASOUND TECHNIQUE: Gray-scale sonography with compression, as well as color and duplex ultrasound, were performed to evaluate the deep venous system(s) from  the level of the common femoral vein through the popliteal and proximal calf veins. COMPARISON:  None. FINDINGS: VENOUS Normal compressibility of the common femoral, superficial femoral, and popliteal veins, as well as the visualized calf veins. Visualized portions of profunda femoral vein and great saphenous vein unremarkable. No filling defects to suggest DVT on grayscale or color Doppler imaging. Doppler waveforms show normal direction of venous flow, normal respiratory phasicity and response to augmentation. Limited views of the contralateral common femoral vein are unremarkable. OTHER None. Limitations: none IMPRESSION: No femoropopliteal DVT nor evidence of DVT within the visualized calf veins. If clinical symptoms are inconsistent or if there are persistent or worsening symptoms, further imaging (possibly involving the iliac veins) may be warranted. Electronically Signed   By: Abigail Miyamoto M.D.   On: 03/07/2020 11:47     Assessment and plan- Patient is a 64 y.o. female withinvasive mammary carcinoma of the right breast clinical prognostic stage IamcT1 ccN0 cM0 ER/PR positive HER-2/neu positive. She is s/p 6 cycles of neoadjuvant TCHP chemotherapy and is here for posttreatment follow-up  1.  Chemo-induced anemia:Baseline hemoglobin prior to starting chemotherapy was between 11-12.  It has gradually drifted down to 8.5 and has remained this way over the last 1 month.  Anemia work-up was otherwise unremarkable and this is consistent with chemo induced anemia which will take several weeks to improve.  However she can proceed with her lumpectomy at this time.  We will continue to monitor her  anemia.  2.  Chemo-induced fatigue: Likely to improve as she goes further away from chemotherapy  3.  Discussed future plans with the patient that depending on results of final pathology I will either plan to continue Herceptin plus minus Perjeta adjuvantly if she has a pathological complete response versus  adjuvant does not achieve a pathological complete response for a year  4.  AKI: Patient will receive IV fluids today and a dose of Imodium and another 1 L of IV fluids later this week.  We will also repeat her labs next week followed by IV fluids twice a week  5.  Hypokalemia: I have refilled her oral potassium at home.  6.  Drug-induced diarrhea: Secondary to Herceptin and Perjeta.  Continue as needed Imodium  I will see her back in 1 month's time with CBC with differential and CMP following her surgery   Visit Diagnosis 1. Antineoplastic chemotherapy induced anemia   2. Drug-induced diarrhea   3. Hypokalemia   4. Fatigue, unspecified type   5. AKI (acute kidney injury) ([place])      Dr. Randa Evens, MD, MPH [facility] [phone] 03/15/2020 8:30 AM

## 2020-03-19 ENCOUNTER — Other Ambulatory Visit: Payer: Self-pay

## 2020-03-19 ENCOUNTER — Inpatient Hospital Stay: Payer: Medicare Other

## 2020-03-19 ENCOUNTER — Other Ambulatory Visit: Payer: Self-pay | Admitting: *Deleted

## 2020-03-19 ENCOUNTER — Other Ambulatory Visit: Payer: Medicare Other

## 2020-03-19 ENCOUNTER — Other Ambulatory Visit: Payer: Self-pay | Admitting: Oncology

## 2020-03-19 VITALS — BP 118/74 | HR 73 | Temp 97.2°F | Resp 20

## 2020-03-19 DIAGNOSIS — Z5112 Encounter for antineoplastic immunotherapy: Secondary | ICD-10-CM | POA: Diagnosis not present

## 2020-03-19 DIAGNOSIS — T451X5A Adverse effect of antineoplastic and immunosuppressive drugs, initial encounter: Secondary | ICD-10-CM

## 2020-03-19 DIAGNOSIS — D6481 Anemia due to antineoplastic chemotherapy: Secondary | ICD-10-CM

## 2020-03-19 DIAGNOSIS — D649 Anemia, unspecified: Secondary | ICD-10-CM

## 2020-03-19 DIAGNOSIS — C50919 Malignant neoplasm of unspecified site of unspecified female breast: Secondary | ICD-10-CM

## 2020-03-19 LAB — CBC WITH DIFFERENTIAL/PLATELET
Abs Immature Granulocytes: 0.02 10*3/uL (ref 0.00–0.07)
Basophils Absolute: 0 10*3/uL (ref 0.0–0.1)
Basophils Relative: 0 %
Eosinophils Absolute: 0 10*3/uL (ref 0.0–0.5)
Eosinophils Relative: 0 %
HCT: 26.1 % — ABNORMAL LOW (ref 36.0–46.0)
Hemoglobin: 7.9 g/dL — ABNORMAL LOW (ref 12.0–15.0)
Immature Granulocytes: 0 %
Lymphocytes Relative: 35 %
Lymphs Abs: 1.9 10*3/uL (ref 0.7–4.0)
MCH: 27.9 pg (ref 26.0–34.0)
MCHC: 30.3 g/dL (ref 30.0–36.0)
MCV: 92.2 fL (ref 80.0–100.0)
Monocytes Absolute: 0.8 10*3/uL (ref 0.1–1.0)
Monocytes Relative: 14 %
Neutro Abs: 2.8 10*3/uL (ref 1.7–7.7)
Neutrophils Relative %: 51 %
Platelets: 154 10*3/uL (ref 150–400)
RBC: 2.83 MIL/uL — ABNORMAL LOW (ref 3.87–5.11)
RDW: 17.8 % — ABNORMAL HIGH (ref 11.5–15.5)
WBC: 5.5 10*3/uL (ref 4.0–10.5)
nRBC: 0 % (ref 0.0–0.2)

## 2020-03-19 LAB — COMPREHENSIVE METABOLIC PANEL
ALT: 13 U/L (ref 0–44)
AST: 16 U/L (ref 15–41)
Albumin: 3.8 g/dL (ref 3.5–5.0)
Alkaline Phosphatase: 85 U/L (ref 38–126)
Anion gap: 9 (ref 5–15)
BUN: 22 mg/dL (ref 8–23)
CO2: 29 mmol/L (ref 22–32)
Calcium: 8.6 mg/dL — ABNORMAL LOW (ref 8.9–10.3)
Chloride: 105 mmol/L (ref 98–111)
Creatinine, Ser: 1.43 mg/dL — ABNORMAL HIGH (ref 0.44–1.00)
GFR calc Af Amer: 44 mL/min — ABNORMAL LOW (ref >60)
GFR calc non Af Amer: 38 mL/min — ABNORMAL LOW (ref >60)
Glucose, Bld: 95 mg/dL (ref 70–99)
Potassium: 3.6 mmol/L (ref 3.5–5.1)
Sodium: 143 mmol/L (ref 135–145)
Total Bilirubin: 0.5 mg/dL (ref 0.3–1.2)
Total Protein: 6.3 g/dL — ABNORMAL LOW (ref 6.5–8.1)

## 2020-03-19 LAB — PREPARE RBC (CROSSMATCH)

## 2020-03-19 MED ORDER — SODIUM CHLORIDE 0.9 % IV SOLN
Freq: Once | INTRAVENOUS | Status: AC
  Filled 2020-03-19: qty 250

## 2020-03-19 MED ORDER — HEPARIN SOD (PORK) LOCK FLUSH 100 UNIT/ML IV SOLN
500.0000 [IU] | Freq: Once | INTRAVENOUS | Status: AC
  Administered 2020-03-19: 500 [IU] via INTRAVENOUS
  Filled 2020-03-19: qty 5

## 2020-03-19 MED ORDER — SODIUM CHLORIDE 0.9% FLUSH
10.0000 mL | Freq: Once | INTRAVENOUS | Status: AC
  Administered 2020-03-19: 10 mL via INTRAVENOUS
  Filled 2020-03-19: qty 10

## 2020-03-20 ENCOUNTER — Inpatient Hospital Stay: Payer: Medicare Other

## 2020-03-20 VITALS — BP 170/90 | HR 63 | Temp 97.4°F | Resp 16

## 2020-03-20 DIAGNOSIS — Z95828 Presence of other vascular implants and grafts: Secondary | ICD-10-CM

## 2020-03-20 DIAGNOSIS — Z5112 Encounter for antineoplastic immunotherapy: Secondary | ICD-10-CM | POA: Diagnosis not present

## 2020-03-20 DIAGNOSIS — D649 Anemia, unspecified: Secondary | ICD-10-CM

## 2020-03-20 MED ORDER — ACETAMINOPHEN 325 MG PO TABS
650.0000 mg | ORAL_TABLET | Freq: Once | ORAL | Status: AC
  Administered 2020-03-20: 09:00:00 650 mg via ORAL
  Filled 2020-03-20: qty 2

## 2020-03-20 MED ORDER — HEPARIN SOD (PORK) LOCK FLUSH 100 UNIT/ML IV SOLN
INTRAVENOUS | Status: AC
  Filled 2020-03-20: qty 5

## 2020-03-20 MED ORDER — SODIUM CHLORIDE 0.9% IV SOLUTION
250.0000 mL | Freq: Once | INTRAVENOUS | Status: AC
  Administered 2020-03-20: 250 mL via INTRAVENOUS
  Filled 2020-03-20: qty 250

## 2020-03-20 MED ORDER — HEPARIN SOD (PORK) LOCK FLUSH 100 UNIT/ML IV SOLN
500.0000 [IU] | Freq: Once | INTRAVENOUS | Status: AC
  Administered 2020-03-20: 500 [IU] via INTRAVENOUS
  Filled 2020-03-20: qty 5

## 2020-03-21 ENCOUNTER — Encounter
Admission: RE | Admit: 2020-03-21 | Discharge: 2020-03-21 | Disposition: A | Discharge status: Home / Self Care | Payer: Medicare Other | Source: Ambulatory Visit | Attending: General Surgery | Admitting: General Surgery

## 2020-03-21 ENCOUNTER — Other Ambulatory Visit: Payer: Self-pay

## 2020-03-21 DIAGNOSIS — Z01818 Encounter for other preprocedural examination: Secondary | ICD-10-CM | POA: Insufficient documentation

## 2020-03-21 LAB — BPAM RBC
Blood Product Expiration Date: 202105112359
ISSUE DATE / TIME: 202104270943
Unit Type and Rh: 5100

## 2020-03-21 LAB — TYPE AND SCREEN
ABO/RH(D): O POS
Antibody Screen: NEGATIVE
Unit division: 0

## 2020-03-21 NOTE — Patient Instructions (Addendum)
Your procedure is scheduled on: 03-28-20 [facility] Report to [place] @ 7:45 AM  Remember: Instructions that are not followed completely may result in serious medical risk, up to and including death, or upon the discretion of your surgeon and anesthesiologist your surgery may need to be rescheduled.    _x___ 1. Do not eat food after midnight the night before your procedure. NO GUM OR CANDY AFTER MIDNIGHT. You may drink WATER up to 2 hours before you are scheduled to arrive at the hospital for your procedure.  Do not drink WATER within 2 hours of your scheduled arrival to the hospital.  Type 1 and type 2 diabetics should only drink water.   ____Ensure clear carbohydrate drink on the way to the hospital for bariatric patients  ____Ensure clear carbohydrate drink 3 hours before surgery.    __x__ 2. No Alcohol for 24 hours before or after surgery.   __x__3. No Smoking or e-cigarettes for 24 prior to surgery.  Do not use any chewable tobacco products for at least 6 hour prior to surgery   ____  4. Bring all medications with you on the day of surgery if instructed.    __x__ 5. Notify your doctor if there is any change in your medical condition     (cold, fever, infections).    x___6. On the morning of surgery brush your teeth with toothpaste and water.  You may rinse your mouth with mouth wash if you wish.  Do not swallow any toothpaste or mouthwash.   Do not wear jewelry, make-up, hairpins, clips or nail polish.  Do not wear lotions, powders, or perfumes.   Do not shave 48 hours prior to surgery. Men may shave face and neck.  Do not bring valuables to the hospital.    [facility] is not responsible for any belongings or valuables.               Contacts, dentures or bridgework may not be worn into surgery.  Leave your suitcase in the car. After surgery it may be brought to your room.  For patients admitted to the hospital, discharge time is determined by your  treatment  team.  _  Patients discharged the day of surgery will not be allowed to drive home.  You will need someone to drive you home and stay with you the night of your procedure.    Please read over the following fact sheets that you were given:   [facility] Preparing for Surgery  _x___ TAKE THE FOLLOWING MEDICATION THE MORNING OF SURGERY WITH A SMALL SIP OF WATER. These include:  1. GABAPENTIN (NEURONTIN)  2. COREG (CARVEDILOL)  3. PROTONIX (PANTOPRAZOLE)  4.  5.  6.  ____Fleets enema or Magnesium Citrate as directed.   _x___ Use CHG Soap or sage wipes as directed on instruction sheet   _X___ Use inhalers on the day of surgery and bring to hospital day of surgery-USE YOUR ADVAIR AND ALBUTEROL INHALER AM OF SURGERY AND BRING ALBUTEROL INHALER TO HOSPITAL  _X___ Stop Metformin 2 days prior to surgery-LAST DOSE ON SUNDAY 03-25-20   ____ Take 1/2 of usual insulin dose the night before surgery and none on the morning surgery.   _x___ Follow recommendations from Cardiologist, Pulmonologist or PCP regarding  stopping Aspirin, Coumadin, Plavix ,Eliquis, Effient, or Pradaxa, and Pletal.  X____Stop Anti-inflammatories such as Advil, Aleve, Ibuprofen, Motrin, Naproxen, Naprosyn, Goodies powders or aspirin products NOW-OK to take Tylenol OR ULTRAM (TRAMADOL) IF NEEDED  ____ Stop supplements until after surgery.     ____ Bring C-Pap to the hospital.

## 2020-03-22 ENCOUNTER — Inpatient Hospital Stay: Payer: Medicare Other

## 2020-03-26 ENCOUNTER — Inpatient Hospital Stay: Payer: Medicare Other | Attending: Oncology

## 2020-03-26 ENCOUNTER — Inpatient Hospital Stay: Payer: Medicare Other

## 2020-03-26 ENCOUNTER — Other Ambulatory Visit: Payer: Self-pay

## 2020-03-26 ENCOUNTER — Other Ambulatory Visit
Admission: RE | Admit: 2020-03-26 | Discharge: 2020-03-26 | Disposition: A | Discharge status: Home / Self Care | Payer: Medicare Other | Source: Ambulatory Visit | Attending: General Surgery | Admitting: General Surgery

## 2020-03-26 VITALS — BP 155/88 | HR 70 | Temp 96.1°F | Resp 20

## 2020-03-26 DIAGNOSIS — Z20822 Contact with and (suspected) exposure to covid-19: Secondary | ICD-10-CM | POA: Diagnosis not present

## 2020-03-26 DIAGNOSIS — C50911 Malignant neoplasm of unspecified site of right female breast: Secondary | ICD-10-CM | POA: Insufficient documentation

## 2020-03-26 DIAGNOSIS — R197 Diarrhea, unspecified: Secondary | ICD-10-CM | POA: Insufficient documentation

## 2020-03-26 DIAGNOSIS — G620 Drug-induced polyneuropathy: Secondary | ICD-10-CM | POA: Diagnosis not present

## 2020-03-26 DIAGNOSIS — Z01812 Encounter for preprocedural laboratory examination: Secondary | ICD-10-CM | POA: Diagnosis present

## 2020-03-26 DIAGNOSIS — R7989 Other specified abnormal findings of blood chemistry: Secondary | ICD-10-CM

## 2020-03-26 DIAGNOSIS — D6481 Anemia due to antineoplastic chemotherapy: Secondary | ICD-10-CM | POA: Diagnosis not present

## 2020-03-26 DIAGNOSIS — R5383 Other fatigue: Secondary | ICD-10-CM | POA: Diagnosis not present

## 2020-03-26 DIAGNOSIS — C50919 Malignant neoplasm of unspecified site of unspecified female breast: Secondary | ICD-10-CM

## 2020-03-26 LAB — CBC WITH DIFFERENTIAL/PLATELET
Abs Immature Granulocytes: 0.01 10*3/uL (ref 0.00–0.07)
Basophils Absolute: 0.1 10*3/uL (ref 0.0–0.1)
Basophils Relative: 1 %
Eosinophils Absolute: 0.1 10*3/uL (ref 0.0–0.5)
Eosinophils Relative: 1 %
HCT: 30.7 % — ABNORMAL LOW (ref 36.0–46.0)
Hemoglobin: 9.3 g/dL — ABNORMAL LOW (ref 12.0–15.0)
Immature Granulocytes: 0 %
Lymphocytes Relative: 28 %
Lymphs Abs: 1.7 10*3/uL (ref 0.7–4.0)
MCH: 27.7 pg (ref 26.0–34.0)
MCHC: 30.3 g/dL (ref 30.0–36.0)
MCV: 91.4 fL (ref 80.0–100.0)
Monocytes Absolute: 0.5 10*3/uL (ref 0.1–1.0)
Monocytes Relative: 8 %
Neutro Abs: 3.7 10*3/uL (ref 1.7–7.7)
Neutrophils Relative %: 62 %
Platelets: 122 10*3/uL — ABNORMAL LOW (ref 150–400)
RBC: 3.36 MIL/uL — ABNORMAL LOW (ref 3.87–5.11)
RDW: 16.6 % — ABNORMAL HIGH (ref 11.5–15.5)
WBC: 6.1 10*3/uL (ref 4.0–10.5)
nRBC: 0 % (ref 0.0–0.2)

## 2020-03-26 LAB — COMPREHENSIVE METABOLIC PANEL
ALT: 12 U/L (ref 0–44)
AST: 16 U/L (ref 15–41)
Albumin: 3.8 g/dL (ref 3.5–5.0)
Alkaline Phosphatase: 86 U/L (ref 38–126)
Anion gap: 9 (ref 5–15)
BUN: 26 mg/dL — ABNORMAL HIGH (ref 8–23)
CO2: 31 mmol/L (ref 22–32)
Calcium: 9.1 mg/dL (ref 8.9–10.3)
Chloride: 103 mmol/L (ref 98–111)
Creatinine, Ser: 1.31 mg/dL — ABNORMAL HIGH (ref 0.44–1.00)
GFR calc Af Amer: 49 mL/min — ABNORMAL LOW (ref >60)
GFR calc non Af Amer: 43 mL/min — ABNORMAL LOW (ref >60)
Glucose, Bld: 104 mg/dL — ABNORMAL HIGH (ref 70–99)
Potassium: 3.5 mmol/L (ref 3.5–5.1)
Sodium: 143 mmol/L (ref 135–145)
Total Bilirubin: 0.6 mg/dL (ref 0.3–1.2)
Total Protein: 6.5 g/dL (ref 6.5–8.1)

## 2020-03-26 LAB — SARS CORONAVIRUS 2 (TAT 6-24 HRS): SARS Coronavirus 2: NEGATIVE

## 2020-03-26 MED ORDER — HEPARIN SOD (PORK) LOCK FLUSH 100 UNIT/ML IV SOLN
500.0000 [IU] | Freq: Once | INTRAVENOUS | Status: AC
  Administered 2020-03-26: 10:00:00 500 [IU] via INTRAVENOUS
  Filled 2020-03-26: qty 5

## 2020-03-26 MED ORDER — SODIUM CHLORIDE 0.9 % IV SOLN
Freq: Once | INTRAVENOUS | Status: AC
  Filled 2020-03-26: qty 250

## 2020-03-26 MED ORDER — SODIUM CHLORIDE 0.9% FLUSH
10.0000 mL | Freq: Once | INTRAVENOUS | Status: AC
  Administered 2020-03-26: 10 mL via INTRAVENOUS
  Filled 2020-03-26: qty 10

## 2020-03-28 ENCOUNTER — Ambulatory Visit: Payer: Medicare Other | Admitting: Anesthesiology

## 2020-03-28 ENCOUNTER — Other Ambulatory Visit: Payer: Self-pay

## 2020-03-28 ENCOUNTER — Ambulatory Visit
Admission: RE | Admit: 2020-03-28 | Discharge: 2020-03-28 | Disposition: A | Discharge status: Home / Self Care | Payer: Medicare Other | Source: Ambulatory Visit | Attending: General Surgery | Admitting: General Surgery

## 2020-03-28 ENCOUNTER — Ambulatory Visit
Admission: RE | Admit: 2020-03-28 | Discharge: 2020-03-28 | Disposition: A | Discharge status: Home / Self Care | Payer: Medicare Other | Attending: General Surgery | Admitting: General Surgery

## 2020-03-28 ENCOUNTER — Encounter
Admission: RE | Disposition: A | Discharge status: Home / Self Care | Payer: Self-pay | Source: Home / Self Care | Attending: General Surgery

## 2020-03-28 ENCOUNTER — Encounter: Payer: Self-pay | Admitting: General Surgery

## 2020-03-28 DIAGNOSIS — C50511 Malignant neoplasm of lower-outer quadrant of right female breast: Secondary | ICD-10-CM

## 2020-03-28 DIAGNOSIS — G4730 Sleep apnea, unspecified: Secondary | ICD-10-CM | POA: Diagnosis not present

## 2020-03-28 DIAGNOSIS — Z170 Estrogen receptor positive status [ER+]: Secondary | ICD-10-CM

## 2020-03-28 DIAGNOSIS — Z888 Allergy status to other drugs, medicaments and biological substances status: Secondary | ICD-10-CM | POA: Diagnosis not present

## 2020-03-28 DIAGNOSIS — J4530 Mild persistent asthma, uncomplicated: Secondary | ICD-10-CM | POA: Insufficient documentation

## 2020-03-28 DIAGNOSIS — Z7952 Long term (current) use of systemic steroids: Secondary | ICD-10-CM | POA: Insufficient documentation

## 2020-03-28 DIAGNOSIS — Z96643 Presence of artificial hip joint, bilateral: Secondary | ICD-10-CM | POA: Diagnosis not present

## 2020-03-28 DIAGNOSIS — E1142 Type 2 diabetes mellitus with diabetic polyneuropathy: Secondary | ICD-10-CM | POA: Diagnosis not present

## 2020-03-28 DIAGNOSIS — Z7984 Long term (current) use of oral hypoglycemic drugs: Secondary | ICD-10-CM | POA: Diagnosis not present

## 2020-03-28 DIAGNOSIS — I10 Essential (primary) hypertension: Secondary | ICD-10-CM | POA: Insufficient documentation

## 2020-03-28 DIAGNOSIS — Z7951 Long term (current) use of inhaled steroids: Secondary | ICD-10-CM | POA: Diagnosis not present

## 2020-03-28 DIAGNOSIS — Z419 Encounter for procedure for purposes other than remedying health state, unspecified: Secondary | ICD-10-CM

## 2020-03-28 DIAGNOSIS — Z90710 Acquired absence of both cervix and uterus: Secondary | ICD-10-CM | POA: Diagnosis not present

## 2020-03-28 DIAGNOSIS — Z8249 Family history of ischemic heart disease and other diseases of the circulatory system: Secondary | ICD-10-CM | POA: Insufficient documentation

## 2020-03-28 DIAGNOSIS — Z79899 Other long term (current) drug therapy: Secondary | ICD-10-CM | POA: Insufficient documentation

## 2020-03-28 DIAGNOSIS — F419 Anxiety disorder, unspecified: Secondary | ICD-10-CM | POA: Insufficient documentation

## 2020-03-28 HISTORY — PX: PARTIAL MASTECTOMY WITH NEEDLE LOCALIZATION AND AXILLARY SENTINEL LYMPH NODE BX: SHX6009

## 2020-03-28 HISTORY — PX: BREAST LUMPECTOMY: SHX2

## 2020-03-28 LAB — GLUCOSE, CAPILLARY
Glucose-Capillary: 91 mg/dL (ref 70–99)
Glucose-Capillary: 104 mg/dL — ABNORMAL HIGH (ref 70–99)

## 2020-03-28 SURGERY — PARTIAL MASTECTOMY WITH NEEDLE LOCALIZATION AND AXILLARY SENTINEL LYMPH NODE BX
Anesthesia: General | Site: Breast | Laterality: Right

## 2020-03-28 MED ORDER — LABETALOL HCL 5 MG/ML IV SOLN
INTRAVENOUS | Status: AC
  Filled 2020-03-28: qty 4

## 2020-03-28 MED ORDER — HYDROCODONE-ACETAMINOPHEN 5-325 MG PO TABS: 1.0000 | ORAL_TABLET | ORAL | 0 refills | Status: AC | PRN

## 2020-03-28 MED ORDER — OXYCODONE HCL 5 MG PO TABS: 5.0000 mg | ORAL_TABLET | Freq: Once | ORAL | Status: DC | PRN

## 2020-03-28 MED ORDER — EPINEPHRINE PF 1 MG/ML IJ SOLN
INTRAMUSCULAR | Status: AC
  Filled 2020-03-28: qty 1

## 2020-03-28 MED ORDER — ONDANSETRON HCL 4 MG/2ML IJ SOLN
INTRAMUSCULAR | Status: AC
  Filled 2020-03-28: qty 2

## 2020-03-28 MED ORDER — SUCCINYLCHOLINE CHLORIDE 200 MG/10ML IV SOSY
PREFILLED_SYRINGE | INTRAVENOUS | Status: AC
  Filled 2020-03-28: qty 10

## 2020-03-28 MED ORDER — LIDOCAINE HCL (PF) 2 % IJ SOLN
INTRAMUSCULAR | Status: AC
  Filled 2020-03-28: qty 5

## 2020-03-28 MED ORDER — LABETALOL HCL 5 MG/ML IV SOLN
10.0000 mg | Freq: Once | INTRAVENOUS | Status: AC
  Administered 2020-03-28: 10 mg via INTRAVENOUS

## 2020-03-28 MED ORDER — EPHEDRINE 5 MG/ML INJ
INTRAVENOUS | Status: AC
  Filled 2020-03-28: qty 10

## 2020-03-28 MED ORDER — TECHNETIUM TC 99M SULFUR COLLOID FILTERED
1.0000 | Freq: Once | INTRAVENOUS | Status: AC | PRN
  Administered 2020-03-28: 0.846 via INTRADERMAL

## 2020-03-28 MED ORDER — CEFAZOLIN SODIUM-DEXTROSE 2-4 GM/100ML-% IV SOLN
INTRAVENOUS | Status: AC
  Filled 2020-03-28: qty 100

## 2020-03-28 MED ORDER — LIDOCAINE HCL (CARDIAC) PF 100 MG/5ML IV SOSY
PREFILLED_SYRINGE | INTRAVENOUS | Status: DC | PRN
  Administered 2020-03-28: 80 mg via INTRAVENOUS

## 2020-03-28 MED ORDER — FENTANYL CITRATE (PF) 100 MCG/2ML IJ SOLN
25.0000 ug | INTRAMUSCULAR | Status: DC | PRN
  Administered 2020-03-28 (×4): 25 ug via INTRAVENOUS

## 2020-03-28 MED ORDER — ONDANSETRON HCL 4 MG/2ML IJ SOLN
INTRAMUSCULAR | Status: DC | PRN
  Administered 2020-03-28: 4 mg via INTRAVENOUS

## 2020-03-28 MED ORDER — DEXAMETHASONE SODIUM PHOSPHATE 10 MG/ML IJ SOLN
INTRAMUSCULAR | Status: DC | PRN
  Administered 2020-03-28: 8 mg via INTRAVENOUS

## 2020-03-28 MED ORDER — EPHEDRINE SULFATE 50 MG/ML IJ SOLN
INTRAMUSCULAR | Status: DC | PRN
  Administered 2020-03-28: 15 mg via INTRAVENOUS

## 2020-03-28 MED ORDER — DEXAMETHASONE SODIUM PHOSPHATE 10 MG/ML IJ SOLN
INTRAMUSCULAR | Status: AC
  Filled 2020-03-28: qty 1

## 2020-03-28 MED ORDER — FENTANYL CITRATE (PF) 100 MCG/2ML IJ SOLN
INTRAMUSCULAR | Status: DC | PRN
  Administered 2020-03-28 (×2): 50 ug via INTRAVENOUS

## 2020-03-28 MED ORDER — HYDROCODONE-ACETAMINOPHEN 5-325 MG PO TABS: 1.0000 | ORAL_TABLET | ORAL | Status: DC | PRN

## 2020-03-28 MED ORDER — MIDAZOLAM HCL 2 MG/2ML IJ SOLN
INTRAMUSCULAR | Status: DC | PRN
  Administered 2020-03-28: 2 mg via INTRAVENOUS

## 2020-03-28 MED ORDER — SODIUM CHLORIDE 0.9 % IV SOLN: INTRAVENOUS | Status: DC

## 2020-03-28 MED ORDER — PROPOFOL 10 MG/ML IV BOLUS
INTRAVENOUS | Status: AC
  Filled 2020-03-28: qty 20

## 2020-03-28 MED ORDER — HYDROCODONE-ACETAMINOPHEN 5-325 MG PO TABS
ORAL_TABLET | ORAL | Status: AC
  Administered 2020-03-28: 1 via ORAL
  Filled 2020-03-28: qty 1

## 2020-03-28 MED ORDER — BUPIVACAINE HCL (PF) 0.5 % IJ SOLN
INTRAMUSCULAR | Status: AC
  Filled 2020-03-28: qty 30

## 2020-03-28 MED ORDER — FENTANYL CITRATE (PF) 100 MCG/2ML IJ SOLN
INTRAMUSCULAR | Status: AC
  Filled 2020-03-28: qty 2

## 2020-03-28 MED ORDER — CEFAZOLIN SODIUM-DEXTROSE 2-4 GM/100ML-% IV SOLN
2.0000 g | INTRAVENOUS | Status: AC
  Administered 2020-03-28: 2 g via INTRAVENOUS

## 2020-03-28 MED ORDER — ONDANSETRON HCL 4 MG/2ML IJ SOLN: 4.0000 mg | Freq: Once | INTRAMUSCULAR | Status: DC | PRN

## 2020-03-28 MED ORDER — SUCCINYLCHOLINE CHLORIDE 20 MG/ML IJ SOLN
INTRAMUSCULAR | Status: DC | PRN
  Administered 2020-03-28: 100 mg via INTRAVENOUS

## 2020-03-28 MED ORDER — PROPOFOL 10 MG/ML IV BOLUS
INTRAVENOUS | Status: DC | PRN
  Administered 2020-03-28: 150 mg via INTRAVENOUS

## 2020-03-28 MED ORDER — LACTATED RINGERS IV SOLN: INTRAVENOUS | Status: DC | PRN

## 2020-03-28 MED ORDER — OXYCODONE HCL 5 MG/5ML PO SOLN: 5.0000 mg | Freq: Once | ORAL | Status: DC | PRN

## 2020-03-28 MED ORDER — BUPIVACAINE-EPINEPHRINE (PF) 0.5% -1:200000 IJ SOLN
INTRAMUSCULAR | Status: DC | PRN
  Administered 2020-03-28: 20 mL

## 2020-03-28 MED ORDER — MIDAZOLAM HCL 2 MG/2ML IJ SOLN
INTRAMUSCULAR | Status: AC
  Filled 2020-03-28: qty 2

## 2020-03-28 SURGICAL SUPPLY — 49 items
BINDER BREAST LRG (GAUZE/BANDAGES/DRESSINGS) IMPLANT
BINDER BREAST MEDIUM (GAUZE/BANDAGES/DRESSINGS) IMPLANT
BINDER BREAST XLRG (GAUZE/BANDAGES/DRESSINGS) IMPLANT
BINDER BREAST XXLRG (GAUZE/BANDAGES/DRESSINGS) IMPLANT
BLADE SURG 15 STRL LF DISP TIS (BLADE) ×2 IMPLANT
BLADE SURG 15 STRL SS (BLADE) ×4
CANISTER SUCT 1200ML W/VALVE (MISCELLANEOUS) ×3 IMPLANT
CHLORAPREP W/TINT 26 (MISCELLANEOUS) ×3 IMPLANT
CNTNR SPEC 2.5X3XGRAD LEK (MISCELLANEOUS)
CONT SPEC 4OZ STER OR WHT (MISCELLANEOUS)
CONTAINER SPEC 2.5X3XGRAD LEK (MISCELLANEOUS) IMPLANT
COVER WAND RF STERILE (DRAPES) ×3 IMPLANT
DERMABOND ADVANCED (GAUZE/BANDAGES/DRESSINGS) ×2
DERMABOND ADVANCED .7 DNX12 (GAUZE/BANDAGES/DRESSINGS) ×1 IMPLANT
DEVICE DUBIN SPECIMEN MAMMOGRA (MISCELLANEOUS) ×3 IMPLANT
DRAPE LAPAROTOMY TRNSV 106X77 (MISCELLANEOUS) ×3 IMPLANT
DRSG GAUZE FLUFF 36X18 (GAUZE/BANDAGES/DRESSINGS) IMPLANT
ELECT CAUTERY BLADE 6.4 (BLADE) ×3 IMPLANT
ELECT REM PT RETURN 9FT ADLT (ELECTROSURGICAL) ×3
ELECTRODE REM PT RTRN 9FT ADLT (ELECTROSURGICAL) ×1 IMPLANT
GLOVE BIO SURGEON STRL SZ 6.5 (GLOVE) ×2 IMPLANT
GLOVE BIO SURGEONS STRL SZ 6.5 (GLOVE) ×1
GLOVE BIOGEL PI IND STRL 6.5 (GLOVE) ×1 IMPLANT
GLOVE BIOGEL PI INDICATOR 6.5 (GLOVE) ×2
GOWN STRL REUS W/ TWL LRG LVL3 (GOWN DISPOSABLE) ×2 IMPLANT
GOWN STRL REUS W/TWL LRG LVL3 (GOWN DISPOSABLE) ×4
KIT MARKER MARGIN INK (KITS) ×2 IMPLANT
KIT TURNOVER KIT A (KITS) ×3 IMPLANT
LABEL OR SOLS (LABEL) ×3 IMPLANT
MARGIN MAP 10MM (MISCELLANEOUS) ×1 IMPLANT
MARKER MARGIN CORRECT CLIP (MARKER) ×2 IMPLANT
NDL HYPO 25X1 1.5 SAFETY (NEEDLE) ×1 IMPLANT
NEEDLE HYPO 22GX1.5 SAFETY (NEEDLE) ×3 IMPLANT
NEEDLE HYPO 25X1 1.5 SAFETY (NEEDLE) ×3 IMPLANT
PACK BASIN MINOR (MISCELLANEOUS) ×3 IMPLANT
RETRACTOR RING XSMALL (MISCELLANEOUS) IMPLANT
RTRCTR WOUND ALEXIS 13CM XS SH (MISCELLANEOUS) ×3
SLEVE PROBE SENORX GAMMA FIND (MISCELLANEOUS) ×3 IMPLANT
SUT ETHILON 3-0 FS-10 30 BLK (SUTURE) ×3
SUT MNCRL 4-0 (SUTURE) ×4
SUT MNCRL 4-0 27XMFL (SUTURE) ×2
SUT SILK 2 0 SH (SUTURE) ×3 IMPLANT
SUT VIC AB 3-0 SH 27 (SUTURE) ×4
SUT VIC AB 3-0 SH 27X BRD (SUTURE) ×2 IMPLANT
SUTURE EHLN 3-0 FS-10 30 BLK (SUTURE) ×1 IMPLANT
SUTURE MNCRL 4-0 27XMF (SUTURE) ×2 IMPLANT
SYR 10ML LL (SYRINGE) ×6 IMPLANT
SYR BULB IRRIG 60ML STRL (SYRINGE) ×3 IMPLANT
WATER STERILE IRR 1000ML POUR (IV SOLUTION) ×3 IMPLANT

## 2020-03-28 NOTE — Discharge Instructions (Signed)
  Diet: Resume home heart healthy regular diet.   Activity: No heavy lifting >20 pounds (children, pets, laundry, garbage) or strenuous activity until follow-up, but light activity and walking are encouraged. Do not drive or drink alcohol if taking narcotic pain medications.  Wound care: May shower with soapy water and pat dry (do not rub incisions), but no baths or submerging incision underwater until follow-up. (no swimming)   Medications: Resume all home medications. For mild to moderate pain: acetaminophen (Tylenol) or ibuprofen (if no kidney disease). Combining Tylenol with alcohol can substantially increase your risk of causing liver disease. Narcotic pain medications, if prescribed, can be used for severe pain, though may cause nausea, constipation, and drowsiness. Do not combine Tylenol and Norco within a 6 hour period as Norco contains Tylenol. If you do not need the narcotic pain medication, you do not need to fill the prescription.  Call office ([phone]) at any time if any questions, worsening pain, fevers/chills, bleeding, drainage from incision site, or other concerns.   AMBULATORY SURGERY  DISCHARGE INSTRUCTIONS   1) The drugs that you were given will stay in your system until tomorrow so for the next 24 hours you should not:  A) Drive an automobile B) Make any legal decisions C) Drink any alcoholic beverage   2) You may resume regular meals tomorrow.  Today it is better to start with liquids and gradually work up to solid foods.  You may eat anything you prefer, but it is better to start with liquids, then soup and crackers, and gradually work up to solid foods.   3) Please notify your doctor immediately if you have any unusual bleeding, trouble breathing, redness and pain at the surgery site, drainage, fever, or pain not relieved by medication.    4) Additional Instructions:        Please contact your physician with any problems or Same Day Surgery at  [phone], Monday through Friday 6 am to 4 pm, or [place] [facility] [place] [facility] number at [phone]. 

## 2020-03-28 NOTE — Anesthesia Procedure Notes (Signed)
Procedure Name: Intubation Date/Time: 03/28/2020 11:37 AM Performed by: Lerry Liner, CRNA Pre-anesthesia Checklist: Patient identified, Emergency Drugs available, Suction available, Patient being monitored and Timeout performed Patient Re-evaluated:Patient Re-evaluated prior to induction Oxygen Delivery Method: Circle system utilized Preoxygenation: Pre-oxygenation with 100% oxygen Induction Type: IV induction Ventilation: Mask ventilation without difficulty Laryngoscope Size: McGraph and 3 Grade View: Grade I Tube type: Oral Tube size: 6.0 mm Number of attempts: 1 Airway Equipment and Method: Stylet Placement Confirmation: ETT inserted through vocal cords under direct vision,  positive ETCO2 and breath sounds checked- equal and bilateral Secured at: 20 cm Tube secured with: Tape Dental Injury: Teeth and Oropharynx as per pre-operative assessment  Comments: Attempted 7.0 ETT unable to advance through cords.  Size decreased to 6.0 (6.5 not attempted).  ETT passed easily & without resistance.  O2 sat remained 99-100%

## 2020-03-28 NOTE — Transfer of Care (Signed)
Immediate Anesthesia Transfer of Care Note  Patient: Christina Mcclain  Procedure(s) Performed: PARTIAL MASTECTOMY WITH NEEDLE LOCALIZATION AND AXILLARY SENTINEL LYMPH NODE BX (Right Breast)  Patient Location: PACU  Anesthesia Type:General  Level of Consciousness: drowsy  Airway & Oxygen Therapy: Patient Spontanous Breathing  Post-op Assessment: Report given to RN  Post vital signs: stable  Last Vitals:  Vitals Value Taken Time  BP 184/87 03/28/20 1309  Temp    Pulse 79 03/28/20 1310  Resp 17 03/28/20 1310  SpO2 100 % 03/28/20 1310  Vitals shown include unvalidated device data.  Last Pain:  Vitals:   03/28/20 0930  TempSrc: Temporal  PainSc: 6       Patients Stated Pain Goal: 0 ([phone])  Complications: No apparent anesthesia complications

## 2020-03-28 NOTE — Interval H&P Note (Signed)
History and Physical Interval Note:  03/28/2020 11:05 AM  Christina Mcclain  has presented today for surgery, with the diagnosis of C50.511, Z17.0 Malignant neoplasm of lower-outer quadrant of rt female breast, estrogen receptor positive.  The various methods of treatment have been discussed with the patient and family. After consideration of risks, benefits and other options for treatment, the patient has consented to  Procedure(s): PARTIAL MASTECTOMY WITH NEEDLE LOCALIZATION AND AXILLARY SENTINEL LYMPH NODE BX (Right) as a surgical intervention.  The patient's history has been reviewed, patient examined, no change in status, stable for surgery.  I have reviewed the patient's chart and labs.  Right breast marked in the pre procedure room. Questions were answered to the patient's satisfaction.     Herbert Pun

## 2020-03-28 NOTE — Progress Notes (Signed)
Spoke to Dr. Ronelle Nigh for pt BP of 180/80 prior to discharge. Patient's SBP baseline is 160s and 170s.  Dr. Ronelle Nigh stated to educate patient on taking Coreg and Micardis once she got home and monitor BP tonight.  Told patient to contact primary care provider if medications do not maintain normal blood pressure.

## 2020-03-28 NOTE — Op Note (Signed)
Preoperative diagnosis: Right breast carcinoma.  Postoperative diagnosis: Right breast carcinoma.   Procedure: Right needle-localized partial mastectomy.                       Right Axillary Sentinel Lymph node biopsy  Anesthesia: GETA  Surgeon: Dr. Windell Moment  Wound Classification: Clean  Indications: Patient is a 64 y.o. female with a two nonpalpable right breast mass noted on mammography with core biopsy demonstrating invasive mammary carcinoma that underwent neoadjuvant chemotherapy with good response and now requires needle-localized partial mastectomy for treatment with sentinel lymph node biopsy.   Findings: 1. Specimen mammography shows marker and tag on specimen 2. Pathology call refers gross examination of margins was negative  3. No other palpable mass or lymph node identified.   Description of procedure: Preoperative needle localization of the two masses was performed by radiology. In the nuclear medicine suite, the subareolar region was injected with Tc-99 sulfur colloid. Localization studies were reviewed. The patient was taken to the operating room and placed supine on the operating table, and after general anesthesia the right chest and axilla were prepped and draped in the usual sterile fashion. A time-out was completed verifying correct patient, procedure, site, positioning, and implant(s) and/or special equipment prior to beginning this procedure.  By comparing the localization studies, the probable trajectory and location of the mass was visualized. A circumareolar skin incision was planned in such a way as to minimize the amount of dissection to reach the mass.  The skin incision was made. Flaps were raised. A 2-0 silk figure-of-eight stay suture was placed and used for retraction. Dissection was then taken down circumferentially, taking care to include the entire needle and a wide margin of grossly normal tissue. The specimen and entire needles were removed. The  specimen was oriented and sent to radiology with the localization studies. Confirmation was received that the entire target lesion had been resected. The wound was irrigated. Hemostasis was checked. The wound was closed with interrupted sutures of 3-0 Vicryl and a subcuticular suture of Monocryl 3-0. No attempt was made to close the dead space.   A hand-held gamma probe was used to identify the location of the hottest spot in the axilla. An incision was made around the caudal axillary hairline. Dissection was carried down until subdermal facias was advanced. The probe was placed and again, the point of maximal count was found. Dissection continue until nodule was identified. The probe was placed in contact with the node. The node was excised in its entirety.  No additional hot spots were identified. No clinically abnormal nodes were palpated. The procedure was terminated. Hemostasis was achieved and the wound closed in layers with deep interrupted 3-0 Vicryl and skin was closed with subcuticular suture of Monocryl 3-0.  The patient tolerated the procedure well and was taken to the postanesthesia care unit in stable condition.   Specimen: Right Breast mass                     Sentinel Lymph node  Complications: None  Estimated Blood Loss: 10 mL

## 2020-03-28 NOTE — Anesthesia Preprocedure Evaluation (Addendum)
Anesthesia Evaluation  Patient identified by MRN, date of birth, ID band Patient awake    Reviewed: Allergy & Precautions, H&P , NPO status , Patient's Chart, lab work & pertinent test results  Airway Mallampati: III  TM Distance: >3 FB Neck ROM: full    Dental  (+) Teeth Intact   Pulmonary asthma , sleep apnea ,    Pulmonary exam normal        Cardiovascular hypertension, Normal cardiovascular exam Rhythm:regular Rate:Normal     Neuro/Psych  Headaches, negative psych ROS   GI/Hepatic Neg liver ROS, GERD  ,  Endo/Other  diabetesHypothyroidism   Renal/GU      Musculoskeletal   Abdominal   Peds  Hematology  (+) Blood dyscrasia, anemia , Hgb 9.3   Anesthesia Other Findings Past Medical History: No date: Adverse effect of anesthesia     Comment:  hard to awaken No date: Anemia No date: Arthritis No date: Asthma No date: Asthma without status asthmaticus     Comment:  unspecified; Take advair each night No date: Breast cancer ([place]) No date: Bronchitis No date: Constipation 2010: Diabetes ([place]) No date: Diabetes mellitus type 2, uncomplicated ([facility]) No date: Diabetes mellitus without complication ([place]) No date: Encounter for blood transfusion     Comment:  hysterectomy 9-10 years ago No date: Heart murmur     Comment:  unspecified No date: Hyperlipidemia 1991: Hypertension No date: Hypothyroid No date: Multiple thyroid nodules No date: Neuromuscular disorder ([place])     Comment:  neuropathy 02/04/2017: Osteoarthritis of right hip No date: Palpitations No date: Pernicious anemia No date: Pneumonia No date: Sleep apnea     Comment:  Use C-PAP; can't use mouth piece. Anxiety No date: Thyroid disease     Comment:  nodules No date: Thyroid nodule     Comment:  x2 No date: Urinary urgency  Past Surgical History: 2011: ABDOMINAL HYSTERECTOMY     Comment:  partial No date: ABDOMINAL SURGERY 2008: CARPAL  TUNNEL RELEASE; Left No date: CARPAL TUNNEL RELEASE; Right 1982: CHOLECYSTECTOMY 10/05/2017: COLONOSCOPY WITH PROPOFOL; N/A     Comment:  Procedure: COLONOSCOPY WITH PROPOFOL;  Surgeon: Manya Silvas, MD;  Location: [facility] ENDOSCOPY;  Service:               Endoscopy;  Laterality: N/A; No date: DIAGNOSTIC LAPAROSCOPY 02/09/2017: JOINT REPLACEMENT; Right     Comment:  TOTAL HIP REPLACEMENT, DR. CARTER. [place] 1990s: KNEE ARTHROSCOPY 11/07/2019: PORTACATH PLACEMENT; Left     Comment:  Procedure: INSERTION PORT-A-CATH;  Surgeon:               Herbert Pun, MD;  Location: [facility] ORS;  Service:              General;  Laterality: Left; 06/23/2017: TOTAL HIP ARTHROPLASTY; Left     Comment:  Procedure: TOTAL HIP ARTHROPLASTY ANTERIOR APPROACH;                Surgeon: Hessie Knows, MD;  Location: [facility] ORS;                Service: Orthopedics;  Laterality: Left;     Reproductive/Obstetrics negative OB ROS                            Anesthesia Physical Anesthesia Plan  ASA: III  Anesthesia Plan: General ETT   Post-op Pain Management:  Induction:   PONV Risk Score and Plan: Ondansetron, Dexamethasone and Treatment may vary due to age or medical condition  Airway Management Planned:   Additional Equipment:   Intra-op Plan:   Post-operative Plan:   Informed Consent: I have reviewed the patients History and Physical, chart, labs and discussed the procedure including the risks, benefits and alternatives for the proposed anesthesia with the patient or authorized representative who has indicated his/her understanding and acceptance.     Dental Advisory Given  Plan Discussed with: Anesthesiologist  Anesthesia Plan Comments:        Anesthesia Quick Evaluation

## 2020-03-28 NOTE — Progress Notes (Signed)
[place] visited pt. in pre-op while rounding; pt. in bed awaiting surgery to remove mass in her breast; pt. was diagnosed w/breast cancer in Nov. --> has undergone chemo and will follow surgery w/5wks of radiation.  Pt. requested prayer; [place] offered prayer for pt.'s surgery and for divine strength and accompaniment through further treatment.    03/28/20 1120  Clinical Encounter Type  Visited With Patient  Visit Type Initial;Pre-op;Spiritual support;Psychological support;Social support  Referral From Other (Comment) (Routine Rounding)  Spiritual Encounters  Spiritual Needs Emotional;Prayer  Stress Factors  Patient Stress Factors Health changes;Major life changes

## 2020-03-29 NOTE — Anesthesia Postprocedure Evaluation (Signed)
Anesthesia Post Note  Patient: Christina Mcclain  Procedure(s) Performed: PARTIAL MASTECTOMY WITH NEEDLE LOCALIZATION AND AXILLARY SENTINEL LYMPH NODE BX (Right Breast)  Patient location during evaluation: PACU Anesthesia Type: General Level of consciousness: awake and alert Pain management: pain level controlled Vital Signs Assessment: post-procedure vital signs reviewed and stable Respiratory status: spontaneous breathing, nonlabored ventilation and respiratory function stable Cardiovascular status: blood pressure returned to baseline and stable Postop Assessment: no apparent nausea or vomiting Anesthetic complications: no     Last Vitals:  Vitals:   03/28/20 1407 03/28/20 1515  BP: (!) 164/64 (!) 180/80  Pulse: 68 67  Resp: 12 14  Temp: (!) 36.2 C   SpO2: 98% 96%    Last Pain:  Vitals:   03/28/20 1515  TempSrc:   PainSc: [place]

## 2020-04-02 LAB — SURGICAL PATHOLOGY

## 2020-04-10 ENCOUNTER — Other Ambulatory Visit: Payer: Self-pay

## 2020-04-10 ENCOUNTER — Encounter: Payer: Self-pay | Admitting: Oncology

## 2020-04-10 ENCOUNTER — Other Ambulatory Visit: Payer: Self-pay | Admitting: *Deleted

## 2020-04-10 ENCOUNTER — Inpatient Hospital Stay (HOSPITAL_BASED_OUTPATIENT_CLINIC_OR_DEPARTMENT_OTHER): Payer: Medicare Other | Admitting: Oncology

## 2020-04-10 ENCOUNTER — Inpatient Hospital Stay: Payer: Medicare Other

## 2020-04-10 VITALS — BP 136/68 | HR 63 | Temp 98.0°F | Resp 16 | Wt 180.8 lb

## 2020-04-10 DIAGNOSIS — C50919 Malignant neoplasm of unspecified site of unspecified female breast: Secondary | ICD-10-CM | POA: Diagnosis not present

## 2020-04-10 DIAGNOSIS — G620 Drug-induced polyneuropathy: Secondary | ICD-10-CM

## 2020-04-10 DIAGNOSIS — T451X5A Adverse effect of antineoplastic and immunosuppressive drugs, initial encounter: Secondary | ICD-10-CM

## 2020-04-10 DIAGNOSIS — C50911 Malignant neoplasm of unspecified site of right female breast: Secondary | ICD-10-CM | POA: Diagnosis not present

## 2020-04-10 DIAGNOSIS — Z7189 Other specified counseling: Secondary | ICD-10-CM

## 2020-04-10 DIAGNOSIS — D6481 Anemia due to antineoplastic chemotherapy: Secondary | ICD-10-CM

## 2020-04-10 LAB — CBC WITH DIFFERENTIAL/PLATELET
Abs Immature Granulocytes: 0.01 10*3/uL (ref 0.00–0.07)
Basophils Absolute: 0.1 10*3/uL (ref 0.0–0.1)
Basophils Relative: 1 %
Eosinophils Absolute: 0.1 10*3/uL (ref 0.0–0.5)
Eosinophils Relative: 2 %
HCT: 31.7 % — ABNORMAL LOW (ref 36.0–46.0)
Hemoglobin: 9.7 g/dL — ABNORMAL LOW (ref 12.0–15.0)
Immature Granulocytes: 0 %
Lymphocytes Relative: 47 %
Lymphs Abs: 2.2 10*3/uL (ref 0.7–4.0)
MCH: 28.7 pg (ref 26.0–34.0)
MCHC: 30.6 g/dL (ref 30.0–36.0)
MCV: 93.8 fL (ref 80.0–100.0)
Monocytes Absolute: 0.4 10*3/uL (ref 0.1–1.0)
Monocytes Relative: 9 %
Neutro Abs: 2 10*3/uL (ref 1.7–7.7)
Neutrophils Relative %: 41 %
Platelets: 192 10*3/uL (ref 150–400)
RBC: 3.38 MIL/uL — ABNORMAL LOW (ref 3.87–5.11)
RDW: 15.6 % — ABNORMAL HIGH (ref 11.5–15.5)
WBC: 4.8 10*3/uL (ref 4.0–10.5)
nRBC: 0 % (ref 0.0–0.2)

## 2020-04-10 LAB — COMPREHENSIVE METABOLIC PANEL
ALT: 12 U/L (ref 0–44)
AST: 15 U/L (ref 15–41)
Albumin: 3.9 g/dL (ref 3.5–5.0)
Alkaline Phosphatase: 95 U/L (ref 38–126)
Anion gap: 7 (ref 5–15)
BUN: 27 mg/dL — ABNORMAL HIGH (ref 8–23)
CO2: 32 mmol/L (ref 22–32)
Calcium: 9.3 mg/dL (ref 8.9–10.3)
Chloride: 105 mmol/L (ref 98–111)
Creatinine, Ser: 1.25 mg/dL — ABNORMAL HIGH (ref 0.44–1.00)
GFR calc Af Amer: 52 mL/min — ABNORMAL LOW (ref >60)
GFR calc non Af Amer: 45 mL/min — ABNORMAL LOW (ref >60)
Glucose, Bld: 97 mg/dL (ref 70–99)
Potassium: 3.9 mmol/L (ref 3.5–5.1)
Sodium: 144 mmol/L (ref 135–145)
Total Bilirubin: 0.5 mg/dL (ref 0.3–1.2)
Total Protein: 6.8 g/dL (ref 6.5–8.1)

## 2020-04-10 MED ORDER — GABAPENTIN 300 MG PO CAPS
300.0000 mg | ORAL_CAPSULE | ORAL | 0 refills | Status: DC
Start: 2020-04-10 — End: 2020-05-21

## 2020-04-10 NOTE — Progress Notes (Signed)
Patient referral order in for radiation Oncology. Acupuncture referral in process,  Christina Mcclain notified and will began process.

## 2020-04-10 NOTE — Progress Notes (Signed)
Patient here for oncology follow-up appointment, expresses concerns of new numbness.

## 2020-04-12 NOTE — Progress Notes (Signed)
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 It showed 2 suspicious lesions in the right breast. Diagnostic mammogram and ultrasound confirmed 2 masses in the 9 o'clock position of the right breast measuring 1.1 x 1.2 x 1.2 cm and the other one measuring 0.8 x 0.8 x 0.9 cm. These 2 masses were 1.7 cm apart. Sonographic evaluation of the right axilla did not show any enlarged adenopathy. 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  Baseline MUGA scan showed EF of 58%. MRI showed two distinct masses in the right breast 1.5 cm and 1.2 cm 2 cm apart no additional suspicious masses or abnormal enhancement identified. No suspicious adenopathy  6 cycles of neoadjuvant TCHP chemotherapy completed on 02/29/2020.    Final pathology showed residual tumor of 5 mm.Negative margins.  1 sentinel lymph node negative for malignancy.  Overall cancer cellularity 1%.  Percentage of cancer that is in situ 0%.  [id number]  Interval  history-patient still has ongoing fatigue but is overall feeling better as compared to her last visit.  She reports tingling numbness in her hands and feet which is gradually getting worse.  She is currently taking gabapentin 200 mg twice a day.  Diarrhea is infrequent and only controlled with Imodium  ECOG PS- 1 Pain scale- 0   Review of systems- Review of Systems  Constitutional: Positive for malaise/fatigue. Negative for chills, fever and weight loss.  HENT: Negative for congestion, ear discharge and nosebleeds.   Eyes: Negative for blurred vision.  Respiratory: Negative for cough, hemoptysis, sputum production, shortness of breath and wheezing.   Cardiovascular: Negative for chest pain, palpitations, orthopnea and claudication.  Gastrointestinal: Negative for abdominal pain, blood in stool, constipation, diarrhea, heartburn, melena, nausea and vomiting.  Genitourinary: Negative for dysuria, flank pain, frequency, hematuria and urgency.  Musculoskeletal: Negative for back pain, joint pain and myalgias.  Skin: Negative for rash.  Neurological: Positive for sensory change (Peripheral neuropathy). Negative for dizziness, tingling, focal weakness, seizures, weakness and headaches.  Endo/Heme/Allergies: Does not bruise/bleed easily.  Psychiatric/Behavioral: Negative for depression and suicidal ideas. The patient does not have insomnia.       Allergies  Allergen Reactions  . Statins Rash    Other reaction(s): Muscle Pain Possible hives also     Past Medical History:  Diagnosis Date  . Adverse effect of anesthesia    hard to awaken  . Anemia   . Arthritis   . Asthma   . Asthma without status asthmaticus    unspecified; Take advair each night  . Breast cancer ([place])   . Bronchitis   . Constipation   . Diabetes ([place])  2010  . Diabetes mellitus type 2, uncomplicated ([place])   . Diabetes mellitus without complication ([place])   . Encounter for blood transfusion    hysterectomy 9-10  years ago  . Heart murmur    unspecified  . Hyperlipidemia   . Hypertension 1991  . Hypothyroid   . Multiple thyroid nodules   . Neuromuscular disorder ([facility])    neuropathy  . Osteoarthritis of right hip 02/04/2017  . Palpitations   . Pernicious anemia   . Pneumonia   . Sleep apnea    Use C-PAP; can't use mouth piece. Anxiety  . Thyroid disease    nodules  . Thyroid nodule    x2  . Urinary urgency      Past Surgical History:  Procedure Laterality Date  . ABDOMINAL HYSTERECTOMY  2011   partial  . ABDOMINAL SURGERY    . CARPAL TUNNEL RELEASE Left 2008  . CARPAL TUNNEL RELEASE Right   . CHOLECYSTECTOMY  1982  . COLONOSCOPY WITH PROPOFOL N/A 10/05/2017   Procedure: COLONOSCOPY WITH PROPOFOL;  Surgeon: Manya Silvas, MD;  Location: [facility] ENDOSCOPY;  Service: Endoscopy;  Laterality: N/A;  . DIAGNOSTIC LAPAROSCOPY    . JOINT REPLACEMENT Right 02/09/2017   TOTAL HIP REPLACEMENT, DR. CARTER. [place]  . KNEE ARTHROSCOPY  1990s  . PARTIAL MASTECTOMY WITH NEEDLE LOCALIZATION AND AXILLARY SENTINEL LYMPH NODE BX Right 03/28/2020   Procedure: PARTIAL MASTECTOMY WITH NEEDLE LOCALIZATION AND AXILLARY SENTINEL LYMPH NODE BX;  Surgeon: Herbert Pun, MD;  Location: [facility] ORS;  Service: General;  Laterality: Right;  . PORTACATH PLACEMENT Left 11/07/2019   Procedure: INSERTION PORT-A-CATH;  Surgeon: Herbert Pun, MD;  Location: [facility] ORS;  Service: General;  Laterality: Left;  . TOTAL HIP ARTHROPLASTY Left 06/23/2017   Procedure: TOTAL HIP ARTHROPLASTY ANTERIOR APPROACH;  Surgeon: Hessie Knows, MD;  Location: [facility] ORS;  Service: Orthopedics;  Laterality: Left;    Social History   Socioeconomic History  . Marital status: Married    Spouse name: Not on file  . Number of children: 2  . Years of education: some college  . Highest education level: Not on file  Occupational History  . Occupation: retired    Comment: [employer]  Tobacco Use  . Smoking  status: Never Smoker  . Smokeless tobacco: Never Used  Substance and Sexual Activity  . Alcohol use: No  . Drug use: No  . Sexual activity: Not on file  Other Topics Concern  . Not on file  Social History Narrative  . Not on file   Social Determinants of Health   Financial Resource Strain:   . Difficulty of Paying Living Expenses:   Food Insecurity:   . Worried About Charity fundraiser in the Last Year:   . Arboriculturist in the Last Year:   Transportation Needs:   . Film/video editor (Medical):   Marland Kitchen Lack of Transportation (Non-Medical):   Physical Activity:   . Days of Exercise per Week:   . Minutes of Exercise per Session:   Stress:   . Feeling of Stress :   Social Connections:   . Frequency of Communication with Friends and Family:   . Frequency of Social Gatherings with Friends and Family:   . Attends Religious Services:   . Active Member of Clubs or Organizations:   . Attends Archivist Meetings:   Marland Kitchen Marital Status:   Intimate Partner Violence:   . Fear of Current or Ex-Partner:   .  Emotionally Abused:   Marland Kitchen Physically Abused:   . Sexually Abused:     Family History  Problem Relation Age of Onset  . Heart disease Mother   . Hypertension Mother   . Arrhythmia Mother   . Heart attack Mother   . Diabetes Mother   . Hypertension Father   . Parkinson's disease Father   . Coronary artery disease Maternal Uncle   . Heart attack Maternal Uncle   . Diabetes Sister   . Diabetes Brother   . Heart disease Brother   . Breast cancer Neg Hx      Current Outpatient Medications:  .  acetaminophen (TYLENOL) 500 MG tablet, Take 500 mg by mouth every 6 (six) hours as needed., Disp: , Rfl:  .  carvedilol (COREG) 12.5 MG tablet, Take 12.5 mg by mouth 2 (two) times daily. , Disp: , Rfl:  .  cholecalciferol (VITAMIN D) 1000 units tablet, Take 1,000 Units by mouth daily., Disp: , Rfl:  .  diphenoxylate-atropine (LOMOTIL) 2.5-0.025 MG tablet, Take 1 tablet by  mouth 4 (four) times daily as needed for diarrhea or loose stools., Disp: 60 tablet, Rfl: 0 .  Fluticasone-Salmeterol (ADVAIR) 250-50 MCG/DOSE AEPB, Inhale 1 puff into the lungs 2 (two) times daily. , Disp: , Rfl:  .  lidocaine-prilocaine (EMLA) cream, Apply to affected area once (Patient taking differently: Apply 1 application topically daily as needed (prior to infusion). ), Disp: 30 g, Rfl: 3 .  loperamide (IMODIUM) 2 MG capsule, Take 2 mg by mouth as needed for diarrhea or loose stools., Disp: , Rfl:  .  loratadine (CLARITIN) 10 MG tablet, Take 10 mg by mouth daily as needed for allergies., Disp: , Rfl:  .  metFORMIN (GLUCOPHAGE-XR) 500 MG 24 hr tablet, Take 500 mg by mouth daily with supper. , Disp: , Rfl:  .  potassium chloride (KLOR-CON) 20 MEQ packet, Take 20 mEq by mouth daily., Disp: 30 packet, Rfl: 0 .  solifenacin (VESICARE) 5 MG tablet, Take 10 mg by mouth every evening. , Disp: , Rfl:  .  telmisartan-hydrochlorothiazide (MICARDIS HCT) 80-25 MG tablet, Take 1 tablet by mouth every morning. , Disp: , Rfl:  .  triamcinolone cream (KENALOG) 0.5 %, Apply 1 application topically as needed. , Disp: , Rfl:  .  albuterol (PROVENTIL HFA;VENTOLIN HFA) 108 (90 Base) MCG/ACT inhaler, Inhale 2 puffs into the lungs every 6 (six) hours as needed for wheezing or shortness of breath., Disp: , Rfl:  .  gabapentin (NEURONTIN) 300 MG capsule, Take 1 capsule (300 mg total) by mouth as directed. Take 1 tablet at night for 3 days and then 1 tablet in am and at night for 3 days and then 1 tablet three times a day for rest of month, Disp: 59 capsule, Rfl: 0 .  LORazepam (ATIVAN) 0.5 MG tablet, Take 1 tablet (0.5 mg total) by mouth every 6 (six) hours as needed (Nausea or vomiting). (Patient not taking: Reported on 03/21/2020), Disp: 30 tablet, Rfl: 0 .  ondansetron (ZOFRAN) 8 MG tablet, Take 1 tablet (8 mg total) by mouth 2 (two) times daily as needed (Nausea or vomiting). Begin 4 days after chemotherapy. (Patient  not taking: Reported on 03/12/2020), Disp: 30 tablet, Rfl: 1 .  pantoprazole (PROTONIX) 20 MG tablet, Take 1 tablet (20 mg total) by mouth 2 (two) times daily before a meal. (Patient not taking: Reported on 04/10/2020), Disp: 60 tablet, Rfl: 2 .  prochlorperazine (COMPAZINE) 10 MG tablet, Take 1 tablet (10 mg total)  by mouth every 6 (six) hours as needed (Nausea or vomiting)., Disp: 30 tablet, Rfl: 1 .  traMADol (ULTRAM) 50 MG tablet, Take 1 tablet (50 mg total) by mouth every 6 (six) hours as needed. (Patient not taking: Reported on 04/10/2020), Disp: 30 tablet, Rfl: 0 No current facility-administered medications for this visit.  Facility-Administered Medications Ordered in Other Visits:  .  sodium chloride flush (NS) 0.9 % injection 10 mL, 10 mL, Intravenous, PRN, Sindy Guadeloupe, MD, 10 mL at 01/05/20 1344  Physical exam:  Vitals:   04/10/20 1043  BP: 136/68  Pulse: 63  Resp: 16  Temp: 98 F (36.7 C)  TempSrc: Oral  SpO2: 99%  Weight: 180 lb 12.8 oz (82 kg)   Physical Exam Constitutional:      General: She is not in acute distress. Cardiovascular:     Rate and Rhythm: Normal rate and regular rhythm.     Heart sounds: Normal heart sounds.  Pulmonary:     Effort: Pulmonary effort is normal.     Breath sounds: Normal breath sounds.  Abdominal:     General: Bowel sounds are normal.     Palpations: Abdomen is soft.  Skin:    General: Skin is warm and dry.  Neurological:     Mental Status: She is alert and oriented to person, place, and time.      CMP Latest Ref Rng & Units 04/10/2020  Glucose 70 - 99 mg/dL 97  BUN 8 - 23 mg/dL 27(H)  Creatinine 0.44 - 1.00 mg/dL 1.25(H)  Sodium 135 - 145 mmol/L 144  Potassium 3.5 - 5.1 mmol/L 3.9  Chloride 98 - 111 mmol/L 105  CO2 22 - 32 mmol/L 32  Calcium 8.9 - 10.3 mg/dL 9.3  Total Protein 6.5 - 8.1 g/dL 6.8  Total Bilirubin 0.3 - 1.2 mg/dL 0.5  Alkaline Phos 38 - 126 U/L 95  AST 15 - 41 U/L 15  ALT 0 - 44 U/L 12   CBC Latest Ref  Rng & Units 04/10/2020  WBC 4.0 - 10.5 K/uL 4.8  Hemoglobin 12.0 - 15.0 g/dL 9.7(L)  Hematocrit 36.0 - 46.0 % 31.7(L)  Platelets 150 - 400 K/uL 192    No images are attached to the encounter.  NM SENTINEL NODE INJECTION  Result Date: 03/28/2020 CLINICAL DATA:  Right breast cancer. EXAM: NUCLEAR MEDICINE BREAST LYMPHOSCINTIGRAPHY TECHNIQUE: Intradermal injection of radiopharmaceutical was performed at the 12 o'clock, 3 o'clock, 6 o'clock, and 9 o'clock positions around the right nipple. The patient was then sent to the operating room where the sentinel node(s) were identified and removed by the surgeon. RADIOPHARMACEUTICALS:  Total of 1 mCi Millipore-filtered Technetium-22msulfur colloid, injected in four aliquots of 0.25 mCi each. IMPRESSION: Uncomplicated intradermal injection of a total of 1 mCi Technetium-929mulfur colloid for purposes of sentinel node identification. Electronically Signed   By: HeJacqulynn Cadet.D.   On: 03/28/2020 11:02   MM Breast Surgical Specimen  Result Date: 03/28/2020 CLINICAL DATA:  Specimen radiograph status post right breast lumpectomy. EXAM: SPECIMEN RADIOGRAPH OF THE RIGHT BREAST COMPARISON:  Previous exam(s). FINDINGS: Status post excision of the right breast. The 2 wire tips and a coil shaped biopsy marking clip are present and are marked for pathology. As discussed with Dr. CiWindell Momentthe venous shaped biopsy marking clip which was marking the more posterior site of cancer was displaced during wire localization earlier today, and is not expected to be in the specimen. IMPRESSION: Specimen radiograph of the right breast.  Electronically Signed   By: Ammie Ferrier M.D.   On: 03/28/2020 13:12   MM RT PLC BREAST LOC DEV   1ST LESION  INC MAMMO GUIDE  Result Date: 03/28/2020 CLINICAL DATA:  64 year old female presenting for wire localization of 2 lesions in the right breast. EXAM: NEEDLE LOCALIZATION OF THE RIGHT BREAST WITH MAMMO GUIDANCE COMPARISON:  Previous  exams. PROCEDURE: Patient presents for needle localization prior to right breast lumpectomy. I met with the patient and we discussed the procedure of needle localization including benefits and alternatives. We discussed the high likelihood of a successful procedure. We discussed the risks of the procedure, including infection, bleeding, tissue injury, and further surgery. Informed, written consent was given. The usual time-out protocol was performed immediately prior to the procedure. Using mammographic guidance, sterile technique, 1% lidocaine and a 7 cm modified Kopans needle, the coil shaped biopsy marking clip in the lateral middle depth of the right breast localized using lateral approach. Using mammographic guidance, sterile technique, 1% lidocaine and a 7 cm modified Kopans needle, the venous shaped biopsy marking clip in the lateral posterior right breast localized using lateral approach. During the placement, the needle up to the venous shaped biopsy marking clip and pushed it to the far medial aspect of the breast. The wire, however, is appropriately positioned. The images were marked for Dr. Windell Moment, and the issue with the clip displacement was discussed with him by phone just following the wire placement. This was also discussed with the patient who is aware that the clip is displaced and will not need to be excised. IMPRESSION: Needle localization right breast. No apparent complications. Electronically Signed   By: Ammie Ferrier M.D.   On: 03/28/2020 08:52   MM RT PLC BREAST LOC DEV   EA ADD LESION  INC MAMMO GUIDE  Result Date: 03/28/2020 CLINICAL DATA:  64 year old female presenting for wire localization of 2 lesions in the right breast. EXAM: NEEDLE LOCALIZATION OF THE RIGHT BREAST WITH MAMMO GUIDANCE COMPARISON:  Previous exams. PROCEDURE: Patient presents for needle localization prior to right breast lumpectomy. I met with the patient and we discussed the procedure of needle  localization including benefits and alternatives. We discussed the high likelihood of a successful procedure. We discussed the risks of the procedure, including infection, bleeding, tissue injury, and further surgery. Informed, written consent was given. The usual time-out protocol was performed immediately prior to the procedure. Using mammographic guidance, sterile technique, 1% lidocaine and a 7 cm modified Kopans needle, the coil shaped biopsy marking clip in the lateral middle depth of the right breast localized using lateral approach. Using mammographic guidance, sterile technique, 1% lidocaine and a 7 cm modified Kopans needle, the venous shaped biopsy marking clip in the lateral posterior right breast localized using lateral approach. During the placement, the needle up to the venous shaped biopsy marking clip and pushed it to the far medial aspect of the breast. The wire, however, is appropriately positioned. The images were marked for Dr. Windell Moment, and the issue with the clip displacement was discussed with him by phone just following the wire placement. This was also discussed with the patient who is aware that the clip is displaced and will not need to be excised. IMPRESSION: Needle localization right breast. No apparent complications. Electronically Signed   By: Ammie Ferrier M.D.   On: 03/28/2020 08:52     Assessment and plan- Patient is a 64 y.o. female withinvasive mammary carcinoma of the right breast clinical prognostic stage  IamcT1 ccN0 cM0 ER/PR positive HER-2/neu positive.  She is s/p 6 cycles of neoadjuvant TCHP chemotherapy and is here to discuss final pathology results and further management  Prior to starting neoadjuvant chemotherapy patient was found to have 2 distinct breast masses About 1.5 cm in size at 9 o'clock position 2 cm away from each other.  Both of these were biopsy-proven ER/PR positive and HER-2 positive breast cancer.  No concerning adenopathy at  presentation.  Out of these 2 masses 1 has entirely resolved and there was no residual malignancy in that area.  The other mass had about 5 mm of residual tumor with 1% cellularity.  Patient did respond very well to neoadjuvant TCHP chemotherapy but did have residual disease.  I would therefore like to switch her to Kadcyla instead of continuing Herceptin and Perjeta.  Belenda Cruise trial showed improvement in 3-year progression free survival 88% versus 77% for people who had residual disease after neoadjuvant chemotherapy with Kadcyla as compared to Herceptin.  Kadcyla will be given at 3.6 mg/kg grams every 3 weeks for total of 14 cyclesDiscussed risks and benefits of Kadcyla including all but not limited to fatigue, headache, neuropathy, nausea vomiting diarrhea, cytopenias and abnormal LFTs.  Treatment will be given with a curative intent.  Patient understands and agrees to proceed as planned.  I will plan to start Kadcyla in about 2 weeks time to allow her for further recovery from surgery as well as improvement of her blood counts.  She did have significant chemo-induced anemia  I will also refer her to radiation oncology for adjuvant radiation which can go hand-in-hand with Kadcyla  There would also be role for hormone therapy which I will discuss with her in greater detail upon completion of radiation treatment  Chemo-induced anemia: Hemoglobin improved to 9.7 today but is not yet at her baseline.  Will repeat ferritin and iron studies B12 and folate at next visit  Chemo-induced peripheral neuropathy: I have asked her to increase her gabapentin slowly to 300 mg 3 times a day.  I will also make a referral to acupuncture  I have encouraged patient to take her Covid vaccine   Total face to face encounter time for this patient visit was 40 min.     Visit Diagnosis 1. Invasive carcinoma of breast ([place])   2. Goals of care, counseling/discussion   3. Chemotherapy-induced peripheral neuropathy ([place])    4. Antineoplastic chemotherapy induced anemia      Dr. Randa Evens, MD, MPH [facility] at [facility] [phone] 04/12/2020 11:48 AM

## 2020-04-20 ENCOUNTER — Other Ambulatory Visit: Payer: Self-pay | Admitting: *Deleted

## 2020-04-20 ENCOUNTER — Telehealth: Payer: Self-pay | Admitting: *Deleted

## 2020-04-20 MED ORDER — POTASSIUM CHLORIDE 20 MEQ PO PACK: 20.0000 meq | PACK | Freq: Every day | ORAL | 0 refills | Status: DC

## 2020-04-20 NOTE — Telephone Encounter (Signed)
Pt called and needs refill of powder packet potassium-refilled it and called her to let her know that it was done

## 2020-04-24 ENCOUNTER — Other Ambulatory Visit: Payer: Self-pay | Admitting: Oncology

## 2020-04-24 DIAGNOSIS — C50919 Malignant neoplasm of unspecified site of unspecified female breast: Secondary | ICD-10-CM

## 2020-04-24 NOTE — Progress Notes (Signed)
ON PATHWAY REGIMEN - Breast  No Change  Continue With Treatment as Ordered.     A cycle is every 21 days:     Pertuzumab      Pertuzumab      Trastuzumab-xxxx      Trastuzumab-xxxx      Carboplatin      Docetaxel   **Always confirm dose/schedule in your pharmacy ordering system**  Patient Characteristics: Preoperative or Nonsurgical Candidate (Clinical Staging), Neoadjuvant Therapy followed by Surgery, Invasive Disease, Chemotherapy, HER2 Positive, ER Positive Therapeutic Status: Preoperative or Nonsurgical Candidate (Clinical Staging) AJCC M Category: cM0 AJCC Grade: G3 Breast Surgical Plan: Neoadjuvant Therapy followed by Surgery ER Status: Positive (+) AJCC 8 Stage Grouping: IA HER2 Status: Positive (+) AJCC T Category: cT1 AJCC N Category: cN0 PR Status: Positive (+) Intent of Therapy: Curative Intent, Discussed with Patient

## 2020-04-24 NOTE — Progress Notes (Signed)
DISCONTINUE ON PATHWAY REGIMEN - Breast     A cycle is every 21 days:     Pertuzumab      Pertuzumab      Trastuzumab-xxxx      Trastuzumab-xxxx      Carboplatin      Docetaxel   **Always confirm dose/schedule in your pharmacy ordering system**  REASON: Other Reason PRIOR TREATMENT: [id number]: Docetaxel + Carboplatin + Trastuzumab IV + Pertuzumab IV (TCHP IV) q21 Days x 6 Cycles TREATMENT RESPONSE: Partial Response (PR)  START ON PATHWAY REGIMEN - Breast     A cycle is every 21 days:     Ado-trastuzumab emtansine   **Always confirm dose/schedule in your pharmacy ordering system**  Patient Characteristics: Post-Neoadjuvant Therapy and Resection, HER2 Positive, ER Positive, Residual Disease, Adjuvant Targeted Therapy After Neoadjuvant Chemo/Targeted Therapy Therapeutic Status: Post-Neoadjuvant Therapy and Resection ER Status: Positive (+) HER2 Status: Positive (+) PR Status: Positive (+) Residual Invasive Disease Post-Neoadjuvant Therapy<= Yes Intent of Therapy: Curative Intent, Discussed with Patient

## 2020-04-25 MED ORDER — SODIUM CHLORIDE 0.9% FLUSH
10.0000 mL | INTRAVENOUS | Status: DC | PRN
  Administered 2020-04-26: 10 mL via INTRAVENOUS
  Filled 2020-04-25: qty 10

## 2020-04-26 ENCOUNTER — Other Ambulatory Visit: Payer: Self-pay

## 2020-04-26 ENCOUNTER — Inpatient Hospital Stay: Payer: Medicare Other | Attending: Oncology

## 2020-04-26 ENCOUNTER — Inpatient Hospital Stay: Payer: Medicare Other

## 2020-04-26 ENCOUNTER — Encounter: Payer: Self-pay | Admitting: Oncology

## 2020-04-26 ENCOUNTER — Other Ambulatory Visit: Payer: Self-pay | Admitting: *Deleted

## 2020-04-26 ENCOUNTER — Inpatient Hospital Stay (HOSPITAL_BASED_OUTPATIENT_CLINIC_OR_DEPARTMENT_OTHER): Payer: Medicare Other | Admitting: Oncology

## 2020-04-26 VITALS — BP 195/105 | HR 75 | Temp 95.6°F | Resp 18 | Wt 184.3 lb

## 2020-04-26 VITALS — BP 164/76 | HR 69 | Temp 97.0°F | Resp 18

## 2020-04-26 DIAGNOSIS — Z79899 Other long term (current) drug therapy: Secondary | ICD-10-CM | POA: Diagnosis not present

## 2020-04-26 DIAGNOSIS — Z5112 Encounter for antineoplastic immunotherapy: Secondary | ICD-10-CM | POA: Insufficient documentation

## 2020-04-26 DIAGNOSIS — T451X5A Adverse effect of antineoplastic and immunosuppressive drugs, initial encounter: Secondary | ICD-10-CM

## 2020-04-26 DIAGNOSIS — Z95828 Presence of other vascular implants and grafts: Secondary | ICD-10-CM

## 2020-04-26 DIAGNOSIS — C50911 Malignant neoplasm of unspecified site of right female breast: Secondary | ICD-10-CM | POA: Insufficient documentation

## 2020-04-26 DIAGNOSIS — C50919 Malignant neoplasm of unspecified site of unspecified female breast: Secondary | ICD-10-CM | POA: Diagnosis not present

## 2020-04-26 DIAGNOSIS — G620 Drug-induced polyneuropathy: Secondary | ICD-10-CM

## 2020-04-26 DIAGNOSIS — M2548 Effusion, other site: Secondary | ICD-10-CM | POA: Diagnosis not present

## 2020-04-26 DIAGNOSIS — Z170 Estrogen receptor positive status [ER+]: Secondary | ICD-10-CM

## 2020-04-26 DIAGNOSIS — M25562 Pain in left knee: Secondary | ICD-10-CM | POA: Diagnosis not present

## 2020-04-26 DIAGNOSIS — C50111 Malignant neoplasm of central portion of right female breast: Secondary | ICD-10-CM

## 2020-04-26 DIAGNOSIS — M25561 Pain in right knee: Secondary | ICD-10-CM | POA: Insufficient documentation

## 2020-04-26 LAB — COMPREHENSIVE METABOLIC PANEL
ALT: 16 U/L (ref 0–44)
AST: 18 U/L (ref 15–41)
Albumin: 4 g/dL (ref 3.5–5.0)
Alkaline Phosphatase: 97 U/L (ref 38–126)
Anion gap: 8 (ref 5–15)
BUN: 24 mg/dL — ABNORMAL HIGH (ref 8–23)
CO2: 33 mmol/L — ABNORMAL HIGH (ref 22–32)
Calcium: 9.5 mg/dL (ref 8.9–10.3)
Chloride: 103 mmol/L (ref 98–111)
Creatinine, Ser: 1.29 mg/dL — ABNORMAL HIGH (ref 0.44–1.00)
GFR calc Af Amer: 50 mL/min — ABNORMAL LOW (ref >60)
GFR calc non Af Amer: 43 mL/min — ABNORMAL LOW (ref >60)
Glucose, Bld: 97 mg/dL (ref 70–99)
Potassium: 3.2 mmol/L — ABNORMAL LOW (ref 3.5–5.1)
Sodium: 144 mmol/L (ref 135–145)
Total Bilirubin: 0.6 mg/dL (ref 0.3–1.2)
Total Protein: 7 g/dL (ref 6.5–8.1)

## 2020-04-26 LAB — CBC WITH DIFFERENTIAL/PLATELET
Abs Immature Granulocytes: 0 10*3/uL (ref 0.00–0.07)
Basophils Absolute: 0 10*3/uL (ref 0.0–0.1)
Basophils Relative: 1 %
Eosinophils Absolute: 0.1 10*3/uL (ref 0.0–0.5)
Eosinophils Relative: 2 %
HCT: 29.8 % — ABNORMAL LOW (ref 36.0–46.0)
Hemoglobin: 9.3 g/dL — ABNORMAL LOW (ref 12.0–15.0)
Immature Granulocytes: 0 %
Lymphocytes Relative: 44 %
Lymphs Abs: 2 10*3/uL (ref 0.7–4.0)
MCH: 28.3 pg (ref 26.0–34.0)
MCHC: 31.2 g/dL (ref 30.0–36.0)
MCV: 90.6 fL (ref 80.0–100.0)
Monocytes Absolute: 0.4 10*3/uL (ref 0.1–1.0)
Monocytes Relative: 9 %
Neutro Abs: 2.1 10*3/uL (ref 1.7–7.7)
Neutrophils Relative %: 44 %
Platelets: 131 10*3/uL — ABNORMAL LOW (ref 150–400)
RBC: 3.29 MIL/uL — ABNORMAL LOW (ref 3.87–5.11)
RDW: 14.7 % (ref 11.5–15.5)
WBC: 4.7 10*3/uL (ref 4.0–10.5)
nRBC: 0 % (ref 0.0–0.2)

## 2020-04-26 MED ORDER — HEPARIN SOD (PORK) LOCK FLUSH 100 UNIT/ML IV SOLN
500.0000 [IU] | Freq: Once | INTRAVENOUS | Status: AC | PRN
  Administered 2020-04-26: 500 [IU]
  Filled 2020-04-26: qty 5

## 2020-04-26 MED ORDER — LIDOCAINE-PRILOCAINE 2.5-2.5 % EX CREA: 1.0000 "application " | TOPICAL_CREAM | CUTANEOUS | 0 refills | Status: DC | PRN

## 2020-04-26 MED ORDER — SODIUM CHLORIDE 0.9 % IV SOLN
Freq: Once | INTRAVENOUS | Status: AC
  Filled 2020-04-26: qty 250

## 2020-04-26 MED ORDER — ACETAMINOPHEN 325 MG PO TABS
650.0000 mg | ORAL_TABLET | Freq: Once | ORAL | Status: AC
  Administered 2020-04-26: 650 mg via ORAL
  Filled 2020-04-26: qty 2

## 2020-04-26 MED ORDER — SODIUM CHLORIDE 0.9 % IV SOLN
320.0000 mg | Freq: Once | INTRAVENOUS | Status: AC
  Administered 2020-04-26: 320 mg via INTRAVENOUS
  Filled 2020-04-26: qty 16

## 2020-04-26 MED ORDER — HEPARIN SOD (PORK) LOCK FLUSH 100 UNIT/ML IV SOLN
INTRAVENOUS | Status: AC
  Filled 2020-04-26: qty 5

## 2020-04-26 MED ORDER — DIPHENHYDRAMINE HCL 25 MG PO CAPS
50.0000 mg | ORAL_CAPSULE | Freq: Once | ORAL | Status: AC
  Administered 2020-04-26: 50 mg via ORAL
  Filled 2020-04-26: qty 2

## 2020-04-26 NOTE — Progress Notes (Signed)
Patient complains of numbness, tingling in feet and toes is getting worse, also numbness and tingle in fingers and hands. Experiencing  Lightheadedness and balance issues.

## 2020-04-26 NOTE — Progress Notes (Signed)
Pt tolerated first time infusion of Kadcyla well with no signs of reaction or complications. VSS. RN educated pt on the importance of notifying the clinic if any complications occur at home. Pt verbalized understanding and all questions answered at this time. Pt stable for discharge.   Ytzel Gubler [employer]

## 2020-04-27 ENCOUNTER — Encounter: Payer: Self-pay | Admitting: Radiation Oncology

## 2020-04-27 ENCOUNTER — Other Ambulatory Visit: Payer: Self-pay

## 2020-04-27 NOTE — Progress Notes (Signed)
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 It showed 2 suspicious lesions in the right breast. Diagnostic mammogram and ultrasound confirmed 2 masses in the 9 o'clock position of the right breast measuring 1.1 x 1.2 x 1.2 cm and the other one measuring 0.8 x 0.8 x 0.9 cm. These 2 masses were 1.7 cm apart. Sonographic evaluation of the right axilla did not show any enlarged adenopathy. 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  Baseline MUGA scan showed EF of 58%. MRI showed two distinct masses in the right breast 1.5 cm and 1.2 cm 2 cm apart no additional suspicious masses or abnormal enhancement identified. No suspicious adenopathy  6 cycles of neoadjuvant TCHP chemotherapy completed on 02/29/2020.  Final pathology showed residual tumor of 5 mm.Negative margins.  1 sentinel lymph node negative for malignancy.  Overall cancer cellularity 1%.  Percentage of cancer that is in situ 0%.  [id number]   Interval  history-patient still reports feeling fatigued.  Has tingling numbness in her bilateral hands and feet.  It has not particularly worsened in the last 3 weeks.  Diarrhea is infrequent and often self-limited  ECOG PS- 1 Pain scale- 0   Review of systems- Review of Systems  Constitutional: Positive for malaise/fatigue. Negative for chills, fever and weight loss.  HENT: Negative for congestion, ear discharge and nosebleeds.   Eyes: Negative for blurred vision.  Respiratory: Negative for cough, hemoptysis, sputum production, shortness of breath and wheezing.   Cardiovascular: Negative for chest pain, palpitations, orthopnea and claudication.  Gastrointestinal: Positive for diarrhea. Negative for abdominal pain, blood in stool, constipation, heartburn, melena, nausea and vomiting.  Genitourinary: Negative for dysuria, flank pain, frequency, hematuria and urgency.  Musculoskeletal: Negative for back pain, joint pain and myalgias.  Skin: Negative for rash.  Neurological: Negative for dizziness, tingling, focal weakness, seizures, weakness and headaches.  Endo/Heme/Allergies: Does not bruise/bleed easily.  Psychiatric/Behavioral: Negative for depression and suicidal ideas. The patient does not have insomnia.       Allergies  Allergen Reactions  . Statins Rash    Other reaction(s): Muscle Pain Possible hives also     Past Medical History:  Diagnosis Date  . Adverse effect of anesthesia    hard to awaken  . Anemia   . Arthritis   . Asthma   . Asthma without status asthmaticus    unspecified; Take advair each night  . Breast cancer ([place])   . Bronchitis   . Constipation   . Diabetes ([place]) 2010  . Diabetes mellitus type 2, uncomplicated ([place])   . Diabetes mellitus without complication ([place])   . Encounter for blood  transfusion    hysterectomy 9-10 years ago  . Heart murmur    unspecified  . Hyperlipidemia   . Hypertension 1991  . Hypothyroid   . Multiple thyroid nodules   .  Neuromuscular disorder (H[facility])    neuropathy  . Osteoarthritis of right hip 02/04/2017  . Palpitations   . Pernicious anemia   . Pneumonia   . Sleep apnea    Use C-PAP; can't use mouth piece. Anxiety  . Thyroid disease    nodules  . Thyroid nodule    x2  . Urinary urgency      Past Surgical History:  Procedure Laterality Date  . ABDOMINAL HYSTERECTOMY  2011   partial  . ABDOMINAL SURGERY    . CARPAL TUNNEL RELEASE Left 2008  . CARPAL TUNNEL RELEASE Right   . CHOLECYSTECTOMY  1982  . COLONOSCOPY WITH PROPOFOL N/A 10/05/2017   Procedure: COLONOSCOPY WITH PROPOFOL;  Surgeon: Manya Silvas, MD;  Location: [facility] END[facility]COPY;  Service: Endoscopy;  Laterality: N/A;  . DIAGNOSTIC LAPAROSCOPY    . JOINT REPLACEMENT Right 02/09/2017   TOTAL HIP REPLACEMENT, DR. CARTER. [place]  . KNEE ARTHROSCOPY  1990s  . PARTIAL MASTECTOMY WITH NEEDLE LOCALIZATION AND AXILLARY SENTINEL LYMPH NODE BX Right 03/28/2020   Procedure: PARTIAL MASTECTOMY WITH NEEDLE LOCALIZATION AND AXILLARY SENTINEL LYMPH NODE BX;  Surgeon: Herbert Pun, MD;  Location: [facility] ORS;  Service: General;  Laterality: Right;  . PORTACATH PLACEMENT Left 11/07/2019   Procedure: INSERTION PORT-A-CATH;  Surgeon: Herbert Pun, MD;  Location: [facility] ORS;  Service: General;  Laterality: Left;  . TOTAL HIP ARTHROPLASTY Left 06/23/2017   Procedure: TOTAL HIP ARTHROPLASTY ANTERIOR APPROACH;  Surgeon: Hessie Knows, MD;  Location: [facility] ORS;  Service: Orthopedics;  Laterality: Left;    Social History   Socioeconomic History  . Marital status: Married    Spouse name: Not on file  . Number of children: 2  . Years of education: some college  . Highest education level: Not on file  Occupational History  . Occupation: retired    Comment: [employer]  Tobacco Use  . Smoking status: Never Smoker  . Smokeless tobacco: Never Used  Substance and Sexual Activity  . Alcohol use: No  . Drug use: No  . Sexual  activity: Not on file  Other Topics Concern  . Not on file  Social History Narrative  . Not on file   Social Determinants of Health   Financial Resource Strain:   . Difficulty of Paying Living Expenses:   Food Insecurity:   . Worried About Charity fundraiser in the Last Year:   . Arboriculturist in the Last Year:   Transportation Needs:   . Film/video editor (Medical):   Marland Kitchen Lack of Transportation (Non-Medical):   Physical Activity:   . Days of Exercise per Week:   . Minutes of Exercise per Session:   Stress:   . Feeling of Stress :   Social Connections:   . Frequency of Communication with Friends and Family:   . Frequency of Social Gatherings with Friends and Family:   . Attends Religious Services:   . Active Member of Clubs or Organizations:   . Attends Archivist Meetings:   Marland Kitchen Marital Status:   Intimate Partner Violence:   . Fear of Current or Ex-Partner:   . Emotionally Abused:   Marland Kitchen Physically Abused:   . Sexually Abused:     Family History  Problem Relation Age of  Onset  . Heart disease Mother   . Hypertension Mother   . Arrhythmia Mother   . Heart attack Mother   . Diabetes Mother   . Hypertension Father   . Parkinson's disease Father   . Coronary artery disease Maternal Uncle   . Heart attack Maternal Uncle   . Diabetes Sister   . Diabetes Brother   . Heart disease Brother   . Breast cancer Neg Hx      Current Outpatient Medications:  .  acetaminophen (TYLENOL) 500 MG tablet, Take 500 mg by mouth every 6 (six) hours as needed., Disp: , Rfl:  .  albuterol (PROVENTIL HFA;VENTOLIN HFA) 108 (90 Base) MCG/ACT inhaler, Inhale 2 puffs into the lungs every 6 (six) hours as needed for wheezing or shortness of breath., Disp: , Rfl:  .  carvedilol (COREG) 12.5 MG tablet, Take 12.5 mg by mouth 2 (two) times daily. , Disp: , Rfl:  .  cholecalciferol (VITAMIN D) 1000 units tablet, Take 1,000 Units by mouth daily., Disp: , Rfl:  .  diphenoxylate-atropine  (LOMOTIL) 2.5-0.025 MG tablet, Take 1 tablet by mouth 4 (four) times daily as needed for diarrhea or loose stools., Disp: 60 tablet, Rfl: 0 .  Fluticasone-Salmeterol (ADVAIR) 250-50 MCG/DOSE AEPB, Inhale 1 puff into the lungs 2 (two) times daily. , Disp: , Rfl:  .  gabapentin (NEURONTIN) 300 MG capsule, Take 1 capsule (300 mg total) by mouth as directed. Take 1 tablet at night for 3 days and then 1 tablet in am and at night for 3 days and then 1 tablet three times a day for rest of month, Disp: 59 capsule, Rfl: 0 .  loperamide (IMODIUM) 2 MG capsule, Take 2 mg by mouth as needed for diarrhea or loose stools., Disp: , Rfl:  .  loratadine (CLARITIN) 10 MG tablet, Take 10 mg by mouth daily as needed for allergies., Disp: , Rfl:  .  metFORMIN (GLUCOPHAGE-XR) 500 MG 24 hr tablet, Take 500 mg by mouth daily with supper. , Disp: , Rfl:  .  pantoprazole (PROTONIX) 20 MG tablet, Take 1 tablet (20 mg total) by mouth 2 (two) times daily before a meal., Disp: 60 tablet, Rfl: 2 .  potassium chloride (KLOR-CON) 20 MEQ packet, Take 20 mEq by mouth daily., Disp: 30 packet, Rfl: 0 .  solifenacin (VESICARE) 5 MG tablet, Take 10 mg by mouth every evening. , Disp: , Rfl:  .  telmisartan-hydrochlorothiazide (MICARDIS HCT) 80-25 MG tablet, Take 1 tablet by mouth every morning. , Disp: , Rfl:  .  traMADol (ULTRAM) 50 MG tablet, Take 1 tablet (50 mg total) by mouth every 6 (six) hours as needed., Disp: 30 tablet, Rfl: 0 .  lidocaine-prilocaine (EMLA) cream, Apply 1 application topically as needed., Disp: 30 g, Rfl: 0 .  triamcinolone cream (KENALOG) 0.5 %, Apply 1 application topically as needed. , Disp: , Rfl:  No current facility-administered medications for this visit.  Facility-Administered Medications Ordered in Other Visits:  .  sodium chloride flush (NS) 0.9 % injection 10 mL, 10 mL, Intravenous, PRN, Sindy Guadeloupe, MD, 10 mL at 01/05/20 1344  Physical exam:  Vitals:   04/26/20 0840 04/26/20 0858  BP:  (!)  195/105  Pulse: 75   Resp: 18   Temp: (!) 95.6 F (35.3 C)   TempSrc: Tympanic   SpO2: 100%   Weight: 184 lb 4.8 oz (83.6 kg)    Physical Exam Constitutional:      General: She is not in acute  distress. Cardiovascular:     Rate and Rhythm: Normal rate and regular rhythm.     Heart sounds: Normal heart sounds.  Pulmonary:     Effort: Pulmonary effort is normal.     Breath sounds: Normal breath sounds.  Abdominal:     General: Bowel sounds are normal.     Palpations: Abdomen is soft.  Skin:    General: Skin is warm and dry.  Neurological:     Mental Status: She is alert and oriented to person, place, and time.      CMP Latest Ref Rng & Units 04/26/2020  Glucose 70 - 99 mg/dL 97  BUN 8 - 23 mg/dL 24(H)  Creatinine 0.44 - 1.00 mg/dL 1.29(H)  Sodium 135 - 145 mmol/L 144  Potassium 3.5 - 5.1 mmol/L 3.2(L)  Chloride 98 - 111 mmol/L 103  CO2 22 - 32 mmol/L 33(H)  Calcium 8.9 - 10.3 mg/dL 9.5  Total Protein 6.5 - 8.1 g/dL 7.0  Total Bilirubin 0.3 - 1.2 mg/dL 0.6  Alkaline Phos 38 - 126 U/L 97  AST 15 - 41 U/L 18  ALT 0 - 44 U/L 16   CBC Latest Ref Rng & Units 04/26/2020  WBC 4.0 - 10.5 K/uL 4.7  Hemoglobin 12.0 - 15.0 g/dL 9.3(L)  Hematocrit 36.0 - 46.0 % 29.8(L)  Platelets 150 - 400 K/uL 131(L)     Assessment and plan- Patient is a 64 y.o. female withinvasive mammary carcinoma of the right breast clinical prognostic stage IamcT1 ccN0 cM0 ER/PR positive HER-2/neu positive.  She is s/p 6 cycles of neoadjuvant TCHP chemotherapy with residual 5 mm tumor.  She is here for on treatment assessment prior to cycle 1 of Kadcyla  Counts okay to proceed with cycle 1 of Kadcyla today.  Again discussed risks and benefits of Kadcyla including all but not limited to nausea, vomiting, diarrhea, peripheral neuropathy, cardiac toxicity, cytopenias and abnormal LFTs.  Treatment will be given every 3 weeks for 1 year.  Treatment is being given with a curative intent.  Patient understands and  agrees to proceed as planned.  She will need another MUGA scan at this time which we will order.  Normocytic anemia: Secondary to prior chemotherapy.  Continue to monitor   Thrombocytopenia: Mild and waxes and wanes currently stable in the 130s.  Continue to monitor  Chemo-induced peripheral neuropathy: She is currently on Gabapentin 300 mg twice a day and will be increasing it to 300 mg 3 times a day.  She would like to hold off on making further changes at this time   Visit Diagnosis 1. Invasive carcinoma of breast ([place])   2. History of ongoing treatment with high-risk medication   3. Malignant neoplasm of central portion of right breast in female, estrogen receptor positive ([place])    4. Encounter for monoclonal antibody treatment for malignancy   5. Chemotherapy-induced peripheral neuropathy ([place])      Dr. Randa Evens, MD, MPH [facility] [phone] 04/27/2020 12:23 PM

## 2020-04-30 ENCOUNTER — Other Ambulatory Visit: Payer: Self-pay

## 2020-04-30 ENCOUNTER — Ambulatory Visit
Admission: RE | Admit: 2020-04-30 | Discharge: 2020-04-30 | Disposition: A | Discharge status: Home / Self Care | Payer: Medicare Other | Source: Ambulatory Visit | Attending: Radiation Oncology | Admitting: Radiation Oncology

## 2020-04-30 DIAGNOSIS — C50511 Malignant neoplasm of lower-outer quadrant of right female breast: Secondary | ICD-10-CM | POA: Insufficient documentation

## 2020-04-30 DIAGNOSIS — E079 Disorder of thyroid, unspecified: Secondary | ICD-10-CM | POA: Diagnosis not present

## 2020-04-30 DIAGNOSIS — E785 Hyperlipidemia, unspecified: Secondary | ICD-10-CM | POA: Diagnosis not present

## 2020-04-30 DIAGNOSIS — J45909 Unspecified asthma, uncomplicated: Secondary | ICD-10-CM | POA: Diagnosis not present

## 2020-04-30 DIAGNOSIS — E119 Type 2 diabetes mellitus without complications: Secondary | ICD-10-CM | POA: Insufficient documentation

## 2020-04-30 DIAGNOSIS — I10 Essential (primary) hypertension: Secondary | ICD-10-CM | POA: Insufficient documentation

## 2020-04-30 DIAGNOSIS — M1611 Unilateral primary osteoarthritis, right hip: Secondary | ICD-10-CM | POA: Diagnosis not present

## 2020-04-30 DIAGNOSIS — E039 Hypothyroidism, unspecified: Secondary | ICD-10-CM | POA: Insufficient documentation

## 2020-04-30 DIAGNOSIS — Z79899 Other long term (current) drug therapy: Secondary | ICD-10-CM | POA: Diagnosis not present

## 2020-04-30 DIAGNOSIS — Z7984 Long term (current) use of oral hypoglycemic drugs: Secondary | ICD-10-CM | POA: Insufficient documentation

## 2020-04-30 DIAGNOSIS — C50111 Malignant neoplasm of central portion of right female breast: Secondary | ICD-10-CM

## 2020-04-30 DIAGNOSIS — Z9221 Personal history of antineoplastic chemotherapy: Secondary | ICD-10-CM | POA: Diagnosis not present

## 2020-04-30 DIAGNOSIS — R011 Cardiac murmur, unspecified: Secondary | ICD-10-CM | POA: Insufficient documentation

## 2020-04-30 DIAGNOSIS — G629 Polyneuropathy, unspecified: Secondary | ICD-10-CM | POA: Insufficient documentation

## 2020-04-30 DIAGNOSIS — Z170 Estrogen receptor positive status [ER+]: Secondary | ICD-10-CM | POA: Insufficient documentation

## 2020-04-30 DIAGNOSIS — E041 Nontoxic single thyroid nodule: Secondary | ICD-10-CM | POA: Diagnosis not present

## 2020-04-30 DIAGNOSIS — D649 Anemia, unspecified: Secondary | ICD-10-CM | POA: Diagnosis not present

## 2020-04-30 NOTE — Consult Note (Signed)
NEW PATIENT EVALUATION  Name: Christina Mcclain  MRN: [id number]  Date:   04/30/2020     DOB: 1956-02-18   This 64 y.o. female patient presents to the clinic for initial evaluation of stage Ia (T1 cN0 M0) triple positive invasive mammary carcinoma of the right breast status post neoadjuvant chemotherapy followed by wide local excision and sentinel node biopsy.  REFERRING PHYSICIAN: Kirk Ruths, MD  CHIEF COMPLAINT:  Chief Complaint  Patient presents with  . Breast Cancer    Initial consultation    DIAGNOSIS: The encounter diagnosis was Malignant neoplasm of central portion of right breast in female, estrogen receptor positive ([place]).   PREVIOUS INVESTIGATIONS:  Mammogram ultrasound MRI scans reviewed Clinical notes reviewed Pathology report reviewed  HPI: Patient is a 64 year old female who presented on routine screening mammogram with abnormality of the right breast showing two suspicious lesions in the right breast.  This was was performed in November 2020.  Mammogram and ultrasound confirmed a 1.1 x 1.2 cm lesion as well as 8.8 x 0.8 cm lesion at the 9 o'clock position of the right breast.  These masses were 1.7 cm apart.  Axilla was negative.  MRI scan confirmed both lesions as well as negative axilla.  She underwent biopsy of both lesions were both similar invasive mammary carcinoma ER positive PR positive and HER-2/neu overexpressed.  Patient underwent neoadjuvant chemotherapy with [facility] which was completed in April 2021.  She then underwent a wide local excision showing only 5 mm of residual tumor with negative margins.  One sentinel lymph node was negative for malignancy.  Margins were clear at greater than 5 mm.  No lymph vascular invasion was noted.  Patient had neuropathy from her neoadjuvant chemotherapy mostly in the fingers and toes.  She specifically denies breast tenderness cough or bone pain she is now referred to radiation oncology for adjuvant treatment.  PLANNED  TREATMENT REGIMEN: Hypofractionated right breast radiation therapy  PAST MEDICAL HISTORY:  has a past medical history of Adverse effect of anesthesia, Anemia, Arthritis, Asthma, Asthma without status asthmaticus, Breast cancer ([place]), Bronchitis, Constipation, Diabetes ([place]) (2010), Diabetes mellitus type 2, uncomplicated ([place]), Diabetes mellitus without complication ([place]), Encounter for blood transfusion, Heart murmur, Hyperlipidemia, Hypertension (1991), Hypothyroid, Multiple thyroid nodules, Neuromuscular disorder ([place]), Osteoarthritis of right hip (02/04/2017), Palpitations, Pernicious anemia, Pneumonia, Sleep apnea, Thyroid disease, Thyroid nodule, and Urinary urgency.    PAST SURGICAL HISTORY:  Past Surgical History:  Procedure Laterality Date  . ABDOMINAL HYSTERECTOMY  2011   partial  . ABDOMINAL SURGERY    . CARPAL TUNNEL RELEASE Left 2008  . CARPAL TUNNEL RELEASE Right   . CHOLECYSTECTOMY  1982  . COLONOSCOPY WITH PROPOFOL N/A 10/05/2017   Procedure: COLONOSCOPY WITH PROPOFOL;  Surgeon: Manya Silvas, MD;  Location: [facility] ENDOSCOPY;  Service: Endoscopy;  Laterality: N/A;  . DIAGNOSTIC LAPAROSCOPY    . JOINT REPLACEMENT Right 02/09/2017   TOTAL HIP REPLACEMENT, DR. CARTER. [place]  . KNEE ARTHROSCOPY  1990s  . PARTIAL MASTECTOMY WITH NEEDLE LOCALIZATION AND AXILLARY SENTINEL LYMPH NODE BX Right 03/28/2020   Procedure: PARTIAL MASTECTOMY WITH NEEDLE LOCALIZATION AND AXILLARY SENTINEL LYMPH NODE BX;  Surgeon: Herbert Pun, MD;  Location: [facility] ORS;  Service: General;  Laterality: Right;  . PORTACATH PLACEMENT Left 11/07/2019   Procedure: INSERTION PORT-A-CATH;  Surgeon: Herbert Pun, MD;  Location: [facility] ORS;  Service: General;  Laterality: Left;  . TOTAL HIP ARTHROPLASTY Left 06/23/2017   Procedure: TOTAL HIP ARTHROPLASTY ANTERIOR APPROACH;  Surgeon: Hessie Knows, MD;  Location: [facility] ORS;  Service: Orthopedics;  Laterality: Left;    FAMILY HISTORY: family history  includes Arrhythmia in her mother; Coronary artery disease in her maternal uncle; Diabetes in her brother, mother, and sister; Heart attack in her maternal uncle and mother; Heart disease in her brother and mother; Hypertension in her father and mother; Parkinson's disease in her father.  SOCIAL HISTORY:  reports that she has never smoked. She has never used smokeless tobacco. She reports that she does not drink alcohol or use drugs.  ALLERGIES: Statins  MEDICATIONS:  Current Outpatient Medications  Medication Sig Dispense Refill  . acetaminophen (TYLENOL) 500 MG tablet Take 500 mg by mouth every 6 (six) hours as needed.    Marland Kitchen albuterol (PROVENTIL HFA;VENTOLIN HFA) 108 (90 Base) MCG/ACT inhaler Inhale 2 puffs into the lungs every 6 (six) hours as needed for wheezing or shortness of breath.    . carvedilol (COREG) 12.5 MG tablet Take 12.5 mg by mouth 2 (two) times daily.     . cholecalciferol (VITAMIN D) 1000 units tablet Take 1,000 Units by mouth daily.    . diphenoxylate-atropine (LOMOTIL) 2.5-0.025 MG tablet Take 1 tablet by mouth 4 (four) times daily as needed for diarrhea or loose stools. 60 tablet 0  . Fluticasone-Salmeterol (ADVAIR) 250-50 MCG/DOSE AEPB Inhale 1 puff into the lungs 2 (two) times daily.     Marland Kitchen gabapentin (NEURONTIN) 300 MG capsule Take 1 capsule (300 mg total) by mouth as directed. Take 1 tablet at night for 3 days and then 1 tablet in am and at night for 3 days and then 1 tablet three times a day for rest of month 59 capsule 0  . lidocaine-prilocaine (EMLA) cream Apply 1 application topically as needed. 30 g 0  . loperamide (IMODIUM) 2 MG capsule Take 2 mg by mouth as needed for diarrhea or loose stools.    Marland Kitchen loratadine (CLARITIN) 10 MG tablet Take 10 mg by mouth daily as needed for allergies.    . metFORMIN (GLUCOPHAGE-XR) 500 MG 24 hr tablet Take 500 mg by mouth daily with supper.     . pantoprazole (PROTONIX) 20 MG tablet Take 1 tablet (20 mg total) by mouth 2 (two) times  daily before a meal. 60 tablet 2  . potassium chloride (KLOR-CON) 20 MEQ packet Take 20 mEq by mouth daily. 30 packet 0  . solifenacin (VESICARE) 5 MG tablet Take 10 mg by mouth every evening.     Marland Kitchen telmisartan-hydrochlorothiazide (MICARDIS HCT) 80-25 MG tablet Take 1 tablet by mouth every morning.     . traMADol (ULTRAM) 50 MG tablet Take 1 tablet (50 mg total) by mouth every 6 (six) hours as needed. 30 tablet 0  . triamcinolone cream (KENALOG) 0.5 % Apply 1 application topically as needed.      No current facility-administered medications for this encounter.   Facility-Administered Medications Ordered in Other Encounters  Medication Dose Route Frequency Provider Last Rate Last Admin  . sodium chloride flush (NS) 0.9 % injection 10 mL  10 mL Intravenous PRN Sindy Guadeloupe, MD   10 mL at 01/05/20 1344    ECOG PERFORMANCE STATUS:  0 - Asymptomatic  REVIEW OF SYSTEMS: Except for the peripheral neuropathy Patient denies any weight loss, fatigue, weakness, fever, chills or night sweats. Patient denies any loss of vision, blurred vision. Patient denies any ringing  of the ears or hearing loss. No irregular heartbeat. Patient denies heart murmur or history of fainting. Patient denies any chest pain or  pain radiating to her upper extremities. Patient denies any shortness of breath, difficulty breathing at night, cough or hemoptysis. Patient denies any swelling in the lower legs. Patient denies any nausea vomiting, vomiting of blood, or coffee ground material in the vomitus. Patient denies any stomach pain. Patient states has had normal bowel movements no significant constipation or diarrhea. Patient denies any dysuria, hematuria or significant nocturia. Patient denies any problems walking, swelling in the joints or loss of balance. Patient denies any skin changes, loss of hair or loss of weight. Patient denies any excessive worrying or anxiety or significant depression. Patient denies any problems with  insomnia. Patient denies excessive thirst, polyuria, polydipsia. Patient denies any swollen glands, patient denies easy bruising or easy bleeding. Patient denies any recent infections, allergies or URI. Patient "s visual fields have not changed significantly in recent time.   PHYSICAL EXAM: BP (!) (P) 173/84 (BP Location: Left Arm, Patient Position: Sitting)   Pulse (P) 71   Temp (!) (P) 96.4 F (35.8 C) (Tympanic)   Resp (P) 16   Wt (P) 182 lb 4.8 oz (82.7 kg)   BMI (P) 33.34 kg/m  Right breast is wide local excision scar which is healing well no dominant mass or nodularity is noted in either breast in two positions examined.  No axillary or supraclavicular adenopathy is identified.  Well-developed well-nourished patient in NAD. HEENT reveals PERLA, EOMI, discs not visualized.  Oral cavity is clear. No oral mucosal lesions are identified. Neck is clear without evidence of cervical or supraclavicular adenopathy. Lungs are clear to A&P. Cardiac examination is essentially unremarkable with regular rate and rhythm without murmur rub or thrill. Abdomen is benign with no organomegaly or masses noted. Motor sensory and DTR levels are equal and symmetric in the upper and lower extremities. Cranial nerves II through XII are grossly intact. Proprioception is intact. No peripheral adenopathy or edema is identified. No motor or sensory levels are noted. Crude visual fields are within normal range.  LABORATORY DATA: Pathology report reviewed    RADIOLOGY RESULTS: Mammogram ultrasound and MRI scans reviewed compatible with above-stated findings   IMPRESSION: Stage I triple positive invasive mammary carcinoma the right breast status post neoadjuvant chemotherapy then wide local excision and sentinel node biopsy.  In 64 year old female  PLAN: At this time I have recommended hypofractionated radiation therapy to her right breast.  Would treat her over 3 weeks boosting her scar another 1000 cGy using electron  beam.  Risks and benefits of treatment occluding skin reaction fatigue alteration of blood counts possible occlusion of superficial lung all were discussed in detail with the patient.  She seems to comprehend my treatment plan well.  I have personally set up and ordered CT simulation in about a week's time to allow some further recovery from her chemotherapy.  Patient also will benefit from adjuvant antiestrogen therapy after completion of radiation.  I would like to take this opportunity to thank you for allowing me to participate in the care of your patient.Noreene Filbert, MD

## 2020-05-07 ENCOUNTER — Other Ambulatory Visit: Payer: Self-pay

## 2020-05-07 ENCOUNTER — Ambulatory Visit: Payer: Medicare Other

## 2020-05-08 ENCOUNTER — Ambulatory Visit
Admission: RE | Admit: 2020-05-08 | Discharge: 2020-05-08 | Disposition: A | Discharge status: Home / Self Care | Payer: Medicare Other | Source: Ambulatory Visit | Attending: Oncology | Admitting: Oncology

## 2020-05-08 ENCOUNTER — Other Ambulatory Visit: Payer: Self-pay

## 2020-05-08 DIAGNOSIS — Z170 Estrogen receptor positive status [ER+]: Secondary | ICD-10-CM | POA: Diagnosis not present

## 2020-05-08 DIAGNOSIS — C50919 Malignant neoplasm of unspecified site of unspecified female breast: Secondary | ICD-10-CM | POA: Diagnosis not present

## 2020-05-08 DIAGNOSIS — Z79899 Other long term (current) drug therapy: Secondary | ICD-10-CM | POA: Insufficient documentation

## 2020-05-08 DIAGNOSIS — C50111 Malignant neoplasm of central portion of right female breast: Secondary | ICD-10-CM | POA: Insufficient documentation

## 2020-05-08 MED ORDER — TECHNETIUM TC 99M-LABELED RED BLOOD CELLS IV KIT
22.5260 | PACK | Freq: Once | INTRAVENOUS | Status: AC | PRN
  Administered 2020-05-08: 22.526 via INTRAVENOUS

## 2020-05-09 ENCOUNTER — Encounter: Payer: Self-pay | Admitting: Oncology

## 2020-05-09 ENCOUNTER — Inpatient Hospital Stay (HOSPITAL_BASED_OUTPATIENT_CLINIC_OR_DEPARTMENT_OTHER): Payer: Medicare Other | Admitting: Oncology

## 2020-05-09 ENCOUNTER — Telehealth: Payer: Self-pay | Admitting: *Deleted

## 2020-05-09 VITALS — BP 170/110 | HR 68 | Temp 96.3°F | Resp 18 | Wt 180.0 lb

## 2020-05-09 DIAGNOSIS — M25561 Pain in right knee: Secondary | ICD-10-CM

## 2020-05-09 DIAGNOSIS — R252 Cramp and spasm: Secondary | ICD-10-CM

## 2020-05-09 DIAGNOSIS — E876 Hypokalemia: Secondary | ICD-10-CM | POA: Diagnosis not present

## 2020-05-09 DIAGNOSIS — G8929 Other chronic pain: Secondary | ICD-10-CM | POA: Diagnosis not present

## 2020-05-09 DIAGNOSIS — C50919 Malignant neoplasm of unspecified site of unspecified female breast: Secondary | ICD-10-CM | POA: Diagnosis not present

## 2020-05-09 DIAGNOSIS — M25562 Pain in left knee: Secondary | ICD-10-CM | POA: Diagnosis not present

## 2020-05-09 DIAGNOSIS — Z5112 Encounter for antineoplastic immunotherapy: Secondary | ICD-10-CM | POA: Diagnosis not present

## 2020-05-09 LAB — COMPREHENSIVE METABOLIC PANEL
ALT: 52 U/L — ABNORMAL HIGH (ref 0–44)
AST: 60 U/L — ABNORMAL HIGH (ref 15–41)
Albumin: 4 g/dL (ref 3.5–5.0)
Alkaline Phosphatase: 123 U/L (ref 38–126)
Anion gap: 11 (ref 5–15)
BUN: 29 mg/dL — ABNORMAL HIGH (ref 8–23)
CO2: 32 mmol/L (ref 22–32)
Calcium: 9.5 mg/dL (ref 8.9–10.3)
Chloride: 99 mmol/L (ref 98–111)
Creatinine, Ser: 1.14 mg/dL — ABNORMAL HIGH (ref 0.44–1.00)
GFR calc Af Amer: 58 mL/min — ABNORMAL LOW (ref >60)
GFR calc non Af Amer: 50 mL/min — ABNORMAL LOW (ref >60)
Glucose, Bld: 87 mg/dL (ref 70–99)
Potassium: 3 mmol/L — ABNORMAL LOW (ref 3.5–5.1)
Sodium: 142 mmol/L (ref 135–145)
Total Bilirubin: 0.5 mg/dL (ref 0.3–1.2)
Total Protein: 7.4 g/dL (ref 6.5–8.1)

## 2020-05-09 LAB — MAGNESIUM: Magnesium: 1.7 mg/dL (ref 1.7–2.4)

## 2020-05-09 MED ORDER — POTASSIUM CHLORIDE 20 MEQ PO PACK: 20.0000 meq | PACK | Freq: Two times a day (BID) | ORAL | 60 refills | Status: DC

## 2020-05-09 NOTE — Telephone Encounter (Signed)
Thanks. Took care of it!  Faythe Casa, NP 05/09/2020 3:28 PM

## 2020-05-09 NOTE — Telephone Encounter (Signed)
Pharmacy called stating that they do ont have Potassium packets in stock and it wold be tomorrow afternoon before they can get it. Asking f you want to order the pill or they do have a liquid on hand. Please Arnoldo Hooker

## 2020-05-09 NOTE — Progress Notes (Signed)
Symptom Management Consult note [facility]  [phone] [phone]  Patient Care Team: Kirk Ruths, MD as PCP - General (Internal Medicine) Theodore Demark, RN as Registered Nurse (Oncology) Sindy Guadeloupe, MD as Consulting Physician (Hematology and Oncology) Sindy Guadeloupe, MD as Consulting Physician (Hematology and Oncology) Sindy Guadeloupe, MD as Consulting Physician (Hematology and Oncology)   Name of the patient: Christina Mcclain  [id number]  Feb 09, 1956   Date of visit: 05/09/2020   Diagnosis- Triple negative breast cancer   Chief complaint/ Reason for visit- Bilateral thigh and knee pain  Heme/Onc history:  Oncology History Overview Note  patient is a 64 year old African-American female who underwent a routine screening mammogram on 10/06/2019. It showed 2 suspicious lesions in the right breast. Diagnostic mammogram and ultrasound confirmed 2 masses in the 9 o'clock position of the right breast measuring 1.1 x 1.2 x 1.2 cm and the other one measuring 0.8 x 0.8 x 0.9 cm. These 2 masses were 1.7 cm apart. Sonographic evaluation of the right axilla did not show any enlarged adenopathy. 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    Baseline MUGA scan showed EF of 58%.  MRI showed two distinct masses in the right breast 1.5 cm and 1.2 cm 2 cm apart no additional suspicious masses or abnormal enhancement identified.  No suspicious adenopathy   6 cycles of neoadjuvant TCHP chemotherapy completed on 02/29/2020.   Invasive carcinoma of breast ([place])  10/28/2019 Cancer Staging   Staging form: Breast, AJCC 8th Edition - Clinical stage from 10/28/2019: Stage IA (cT1c, cN0, cM0, G3, ER+, PR+, HER2+) - Signed by Sindy Guadeloupe, MD on 10/31/2019   10/31/2019 Initial Diagnosis   Invasive carcinoma of breast  ([place])   11/10/2019 - 03/21/2020 Chemotherapy   The patient had palonosetron (ALOXI) injection 0.25 mg, 0.25 mg, Intravenous,  Once, 6 of 6 cycles Administration: 0.25 mg (11/10/2019), 0.25 mg (12/01/2019), 0.25 mg (02/09/2020), 0.25 mg (03/01/2020), 0.25 mg (12/29/2019), 0.25 mg (01/19/2020) pegfilgrastim (NEULASTA) injection 6 mg, 6 mg, Subcutaneous, Once, 3 of 3 cycles Administration: 6 mg (01/20/2020), 6 mg (02/10/2020), 6 mg (03/02/2020) pegfilgrastim (NEULASTA ONPRO KIT) injection 6 mg, 6 mg, Subcutaneous, Once, 3 of 3 cycles Administration: 6 mg (11/10/2019), 6 mg (12/01/2019), 6 mg (12/29/2019) CARBOplatin (PARAPLATIN) 630 mg in sodium chloride 0.9 % 250 mL chemo infusion, 630 mg (100 % of original dose 627.5 mg), Intravenous,  Once, 6 of 6 cycles Dose modification:   (original dose 627.5 mg, Cycle 1) Administration: 630 mg (11/10/2019), 540 mg (12/01/2019), 380 mg (02/09/2020), 340 mg (03/01/2020), 460 mg (12/29/2019), 460 mg (01/19/2020) DOCEtaxel (TAXOTERE) 150 mg in sodium chloride 0.9 % 250 mL chemo infusion, 75 mg/m2 = 150 mg, Intravenous,  Once, 6 of 6 cycles Dose modification: 60 mg/m2 (original dose 75 mg/m2, Cycle 5, Reason: Other (see comments), Comment: neutropenia) Administration: 150 mg (11/10/2019), 150 mg (12/01/2019), 120 mg (02/09/2020), 120 mg (03/01/2020), 120 mg (12/29/2019), 120 mg (01/19/2020) fosaprepitant (EMEND) 150 mg in sodium chloride 0.9 % 145 mL IVPB, 150 mg, Intravenous,  Once, 6 of 6 cycles Administration: 150 mg (11/10/2019), 150 mg (12/01/2019), 150 mg (02/09/2020), 150 mg (03/01/2020), 150 mg (12/29/2019), 150 mg (01/19/2020) pertuzumab (PERJETA) 840 mg in sodium chloride 0.9 % 250 mL chemo infusion, 840 mg, Intravenous, Once, 6 of 6 cycles Administration: 938  mg (11/10/2019), 420 mg (12/01/2019), 420 mg (02/09/2020), 420 mg (03/01/2020), 420 mg (12/29/2019), 420 mg (01/19/2020) trastuzumab-dkst (OGIVRI) 750 mg in sodium chloride 0.9 % 250 mL chemo infusion, 756 mg, Intravenous,  Once, 6 of 6  cycles Administration: 750 mg (11/10/2019), 600 mg (12/01/2019), 540 mg (02/09/2020), 540 mg (03/01/2020), 540 mg (12/29/2019), 540 mg (01/19/2020)  for chemotherapy treatment.    04/26/2020 -  Chemotherapy   The patient had ado-trastuzumab emtansine (KADCYLA) 320 mg in sodium chloride 0.9 % 250 mL chemo infusion, 300 mg, Intravenous, Once, 1 of 5 cycles Administration: 320 mg (04/26/2020)  for chemotherapy treatment.     Interval history-Christina Mcclain is a 64 year old female with past medical history significant for anemia, asthma, type 2 diabetes, hyperlipidemia, hypertension, arthritis who was recently diagnosed with stage I ER/PR positive HER-2 positive right breast cancer.  She is followed by Dr. Janese Banks and is status post 6 cycles of TCHP (03/01/2020).  She was recently started on Kadcyla (04/26/2020) and will begin radiation next week.  She developed bilateral knee and thigh swelling approximately 3 days ago.  Admits to chronic arthritis in both knees with intermittent pain and swelling.  She denies injury.  States that last night, she had significant pain in left knee that radiated up her thigh that woke her up from her sleep Describes throbbing and cramping sensation.  She took one ibuprofen and changed positions which allowed her to fall back asleep.  She developed lower and upper extremity neuropathy after first chemotherapy and recently started acupuncture which is stable.  She has tolerated Kadcyla much better than previous chemotherapy.  She denies any GI concerns at this time.  ECOG FS:1 - Symptomatic but completely ambulatory  Review of systems- Review of Systems  Constitutional: Negative.  Negative for chills, fever, malaise/fatigue and weight loss.  HENT: Negative for congestion, ear pain and tinnitus.   Eyes: Negative.  Negative for blurred vision and double vision.  Respiratory: Negative.  Negative for cough, sputum production and shortness of breath.   Cardiovascular: Negative.  Negative for  chest pain, palpitations and leg swelling.  Gastrointestinal: Negative.  Negative for abdominal pain, constipation, diarrhea, nausea and vomiting.  Genitourinary: Negative for dysuria, frequency and urgency.  Musculoskeletal: Positive for joint pain. Negative for back pain and falls.       Bilateral knee pain  Skin: Negative.  Negative for rash.  Neurological: Negative.  Negative for weakness and headaches.  Endo/Heme/Allergies: Negative.  Does not bruise/bleed easily.  Psychiatric/Behavioral: Negative.  Negative for depression. The patient is not nervous/anxious and does not have insomnia.      Current treatment- Kadcyla  Allergies  Allergen Reactions   Statins Rash    Other reaction(s): Muscle Pain Possible hives also     Past Medical History:  Diagnosis Date   Adverse effect of anesthesia    hard to awaken   Anemia    Arthritis    Asthma    Asthma without status asthmaticus    unspecified; Take advair each night   Breast cancer ([place])    Bronchitis    Constipation    Diabetes ([place]) 2010   Diabetes mellitus type 2, uncomplicated ([place])    Diabetes mellitus without complication ([place])    Encounter for blood transfusion    hysterectomy 9-10 years ago   Heart murmur    unspecified   Hyperlipidemia    Hypertension 1991   Hypothyroid    Multiple thyroid nodules    Neuromuscular disorder ([place])  neuropathy   Osteoarthritis of right hip 02/04/2017   Palpitations    Pernicious anemia    Pneumonia    Sleep apnea    Use C-PAP; can't use mouth piece. Anxiety   Thyroid disease    nodules   Thyroid nodule    x2   Urinary urgency      Past Surgical History:  Procedure Laterality Date   ABDOMINAL HYSTERECTOMY  2011   partial   ABDOMINAL SURGERY     CARPAL TUNNEL RELEASE Left 2008   CARPAL TUNNEL RELEASE Right    CHOLECYSTECTOMY  1982   COLONOSCOPY WITH PROPOFOL N/A 10/05/2017   Procedure: COLONOSCOPY WITH PROPOFOL;  Surgeon:  Manya Silvas, MD;  Location: [facility] ENDOSCOPY;  Service: Endoscopy;  Laterality: N/A;   DIAGNOSTIC LAPAROSCOPY     JOINT REPLACEMENT Right 02/09/2017   TOTAL HIP REPLACEMENT, DR. CARTER. [place]   KNEE ARTHROSCOPY  1990s   PARTIAL MASTECTOMY WITH NEEDLE LOCALIZATION AND AXILLARY SENTINEL LYMPH NODE BX Right 03/28/2020   Procedure: PARTIAL MASTECTOMY WITH NEEDLE LOCALIZATION AND AXILLARY SENTINEL LYMPH NODE BX;  Surgeon: Herbert Pun, MD;  Location: [facility] ORS;  Service: General;  Laterality: Right;   PORTACATH PLACEMENT Left 11/07/2019   Procedure: INSERTION PORT-A-CATH;  Surgeon: Herbert Pun, MD;  Location: [facility] ORS;  Service: General;  Laterality: Left;   TOTAL HIP ARTHROPLASTY Left 06/23/2017   Procedure: TOTAL HIP ARTHROPLASTY ANTERIOR APPROACH;  Surgeon: Hessie Knows, MD;  Location: [facility] ORS;  Service: Orthopedics;  Laterality: Left;    Social History   Socioeconomic History   Marital status: Married    Spouse name: Not on file   Number of children: 2   Years of education: some college   Highest education level: Not on file  Occupational History   Occupation: retired    Comment: [employer]  Tobacco Use   Smoking status: Never Smoker   Smokeless tobacco: Never Used  Scientific laboratory technician Use: Never used  Substance and Sexual Activity   Alcohol use: No   Drug use: No   Sexual activity: Not on file  Other Topics Concern   Not on file  Social History Narrative   Not on file   Social Determinants of Health   Financial Resource Strain:    Difficulty of Paying Living Expenses:   Food Insecurity:    Worried About Charity fundraiser in the Last Year:    Arboriculturist in the Last Year:   Transportation Needs:    Film/video editor (Medical):    Lack of Transportation (Non-Medical):   Physical Activity:    Days of Exercise per Week:    Minutes of Exercise per Session:   Stress:    Feeling of Stress :    Social Connections:    Frequency of Communication with Friends and Family:    Frequency of Social Gatherings with Friends and Family:    Attends Religious Services:    Active Member of Clubs or Organizations:    Attends Music therapist:    Marital Status:   Intimate Partner Violence:    Fear of Current or Ex-Partner:    Emotionally Abused:    Physically Abused:    Sexually Abused:     Family History  Problem Relation Age of Onset   Heart disease Mother    Hypertension Mother    Arrhythmia Mother    Heart attack Mother    Diabetes Mother    Hypertension Father  Parkinson's disease Father    Coronary artery disease Maternal Uncle    Heart attack Maternal Uncle    Diabetes Sister    Diabetes Brother    Heart disease Brother    Breast cancer Neg Hx      Current Outpatient Medications:    acetaminophen (TYLENOL) 500 MG tablet, Take 500 mg by mouth every 6 (six) hours as needed., Disp: , Rfl:    albuterol (PROVENTIL HFA;VENTOLIN HFA) 108 (90 Base) MCG/ACT inhaler, Inhale 2 puffs into the lungs every 6 (six) hours as needed for wheezing or shortness of breath., Disp: , Rfl:    carvedilol (COREG) 12.5 MG tablet, Take 12.5 mg by mouth 2 (two) times daily. , Disp: , Rfl:    cholecalciferol (VITAMIN D) 1000 units tablet, Take 1,000 Units by mouth daily., Disp: , Rfl:    diphenoxylate-atropine (LOMOTIL) 2.5-0.025 MG tablet, Take 1 tablet by mouth 4 (four) times daily as needed for diarrhea or loose stools., Disp: 60 tablet, Rfl: 0   Fluticasone-Salmeterol (ADVAIR) 250-50 MCG/DOSE AEPB, Inhale 1 puff into the lungs 2 (two) times daily. , Disp: , Rfl:    gabapentin (NEURONTIN) 300 MG capsule, Take 1 capsule (300 mg total) by mouth as directed. Take 1 tablet at night for 3 days and then 1 tablet in am and at night for 3 days and then 1 tablet three times a day for rest of month, Disp: 59 capsule, Rfl: 0   lidocaine-prilocaine (EMLA) cream,  Apply 1 application topically as needed., Disp: 30 g, Rfl: 0   loperamide (IMODIUM) 2 MG capsule, Take 2 mg by mouth as needed for diarrhea or loose stools., Disp: , Rfl:    loratadine (CLARITIN) 10 MG tablet, Take 10 mg by mouth daily as needed for allergies., Disp: , Rfl:    metFORMIN (GLUCOPHAGE-XR) 500 MG 24 hr tablet, Take 500 mg by mouth daily with supper. , Disp: , Rfl:    pantoprazole (PROTONIX) 20 MG tablet, Take 1 tablet (20 mg total) by mouth 2 (two) times daily before a meal., Disp: 60 tablet, Rfl: 2   potassium chloride (KLOR-CON) 20 MEQ packet, Take 20 mEq by mouth daily., Disp: 90 packet, Rfl: 0   solifenacin (VESICARE) 5 MG tablet, Take 10 mg by mouth every evening. , Disp: , Rfl:    telmisartan-hydrochlorothiazide (MICARDIS HCT) 80-25 MG tablet, Take 1 tablet by mouth every morning. , Disp: , Rfl:    traMADol (ULTRAM) 50 MG tablet, Take 1 tablet (50 mg total) by mouth every 6 (six) hours as needed., Disp: 30 tablet, Rfl: 0   triamcinolone cream (KENALOG) 0.5 %, Apply 1 application topically as needed. , Disp: , Rfl:  No current facility-administered medications for this visit.  Facility-Administered Medications Ordered in Other Visits:    sodium chloride flush (NS) 0.9 % injection 10 mL, 10 mL, Intravenous, PRN, Sindy Guadeloupe, MD, 10 mL at 01/05/20 1344  Physical exam:  Vitals:   05/09/20 1152  BP: (!) 170/110  Pulse: 68  Resp: 18  Temp: (!) 96.3 F (35.7 C)  Weight: 180 lb (81.6 kg)   Physical Exam Constitutional:      Appearance: Normal appearance.  HENT:     Head: Normocephalic and atraumatic.  Eyes:     Pupils: Pupils are equal, round, and reactive to light.  Cardiovascular:     Rate and Rhythm: Normal rate and regular rhythm.     Heart sounds: Normal heart sounds. No murmur heard.   Pulmonary:  Effort: Pulmonary effort is normal.     Breath sounds: Normal breath sounds. No wheezing.  Abdominal:     General: Bowel sounds are normal. There is  no distension.     Palpations: Abdomen is soft.     Tenderness: There is no abdominal tenderness.  Musculoskeletal:        General: Swelling (Left knee) present. Normal range of motion.     Cervical back: Normal range of motion.  Skin:    General: Skin is warm and dry.     Findings: No rash.  Neurological:     Mental Status: She is alert and oriented to person, place, and time.  Psychiatric:        Judgment: Judgment normal.      CMP Latest Ref Rng & Units 05/09/2020  Glucose 70 - 99 mg/dL 87  BUN 8 - 23 mg/dL 29(H)  Creatinine 0.44 - 1.00 mg/dL 1.14(H)  Sodium 135 - 145 mmol/L 142  Potassium 3.5 - 5.1 mmol/L 3.0(L)  Chloride 98 - 111 mmol/L 99  CO2 22 - 32 mmol/L 32  Calcium 8.9 - 10.3 mg/dL 9.5  Total Protein 6.5 - 8.1 g/dL 7.4  Total Bilirubin 0.3 - 1.2 mg/dL 0.5  Alkaline Phos 38 - 126 U/L 123  AST 15 - 41 U/L 60(H)  ALT 0 - 44 U/L 52(H)   CBC Latest Ref Rng & Units 04/26/2020  WBC 4.0 - 10.5 K/uL 4.7  Hemoglobin 12.0 - 15.0 g/dL 9.3(L)  Hematocrit 36 - 46 % 29.8(L)  Platelets 150 - 400 K/uL 131(L)    No images are attached to the encounter.  No results found.   Assessment and plan- Patient is a 64 y.o. female who presented to the management for concerns of acute on chronic bilateral knee and thigh pain x3 days.   Unclear etiology but could be related to Kadcyla treatment versus electrolyte abnormalities or less likely bilateral blood clot.  Will check lab work today.  Potassium levels have been low in the past and she is currently not taking her potassium supplements.  Lab work shows potassium level 3.0.  Due to financial concerns, patient is unable to pick up her last potassium prescription.  Spoke with Elease Etienne, SW who is able to cover [facility].  Prescription sent.  Hypokalemia-secondary to Kadcyla  Patient previously evaluated for blood clot back in April and was negative.  Although it appears she has some swelling  in left knee this is chronic.  No pain or swelling in the calf or foot.  No ultrasounds needed.  If symptoms become worse, would consider ultrasound at that time.  Dr. Janese Banks agrees with plan of care.  Plan: Labs-K3.0 mag normal Sent in a [facility] to help with financial burden.  Take 20 mEq twice daily x4 days then switch back to daily. Patient to call clinic if symptoms worsen or fail to improve.  Disposition: RTC as scheduled to see Dr. Janese Banks next week.   Visit Diagnosis 1. Chronic pain of both knees     Patient expressed understanding and was in agreement with this plan. She also understands that She can call clinic at any time with any questions, concerns, or complaints.   Greater than 50% was spent in counseling and coordination of care with this patient including but not limited to discussion of the relevant topics above (See A&P) including, but not limited to diagnosis and management of acute and  chronic medical conditions.   Thank you for allowing me to participate in the care of this very pleasant patient.    Jacquelin Hawking, NP [place] [facility] Cell - [phone] Pager- [phone] 05/10/2020 1:04 PM

## 2020-05-10 MED ORDER — POTASSIUM CHLORIDE 20 MEQ PO PACK: 20.0000 meq | PACK | Freq: Every day | ORAL | 0 refills | Status: DC

## 2020-05-14 ENCOUNTER — Ambulatory Visit: Payer: Medicare Other

## 2020-05-14 DIAGNOSIS — Z170 Estrogen receptor positive status [ER+]: Secondary | ICD-10-CM | POA: Diagnosis not present

## 2020-05-14 DIAGNOSIS — Z510 Encounter for antineoplastic radiation therapy: Secondary | ICD-10-CM | POA: Diagnosis present

## 2020-05-14 DIAGNOSIS — C50111 Malignant neoplasm of central portion of right female breast: Secondary | ICD-10-CM | POA: Diagnosis present

## 2020-05-15 DIAGNOSIS — Z510 Encounter for antineoplastic radiation therapy: Secondary | ICD-10-CM | POA: Diagnosis not present

## 2020-05-17 ENCOUNTER — Inpatient Hospital Stay: Payer: Medicare Other

## 2020-05-17 ENCOUNTER — Other Ambulatory Visit: Payer: Self-pay

## 2020-05-17 ENCOUNTER — Inpatient Hospital Stay (HOSPITAL_BASED_OUTPATIENT_CLINIC_OR_DEPARTMENT_OTHER): Payer: Medicare Other | Admitting: Oncology

## 2020-05-17 VITALS — BP 202/76 | HR 62 | Temp 94.1°F | Resp 16 | Wt 184.6 lb

## 2020-05-17 VITALS — BP 200/80 | HR 59 | Resp 16

## 2020-05-17 DIAGNOSIS — Z79899 Other long term (current) drug therapy: Secondary | ICD-10-CM

## 2020-05-17 DIAGNOSIS — Z5112 Encounter for antineoplastic immunotherapy: Secondary | ICD-10-CM

## 2020-05-17 DIAGNOSIS — G620 Drug-induced polyneuropathy: Secondary | ICD-10-CM | POA: Diagnosis not present

## 2020-05-17 DIAGNOSIS — C50919 Malignant neoplasm of unspecified site of unspecified female breast: Secondary | ICD-10-CM

## 2020-05-17 DIAGNOSIS — T451X5A Adverse effect of antineoplastic and immunosuppressive drugs, initial encounter: Secondary | ICD-10-CM

## 2020-05-17 DIAGNOSIS — Z95828 Presence of other vascular implants and grafts: Secondary | ICD-10-CM

## 2020-05-17 LAB — CBC WITH DIFFERENTIAL/PLATELET
Abs Immature Granulocytes: 0.01 10*3/uL (ref 0.00–0.07)
Basophils Absolute: 0 10*3/uL (ref 0.0–0.1)
Basophils Relative: 1 %
Eosinophils Absolute: 0.1 10*3/uL (ref 0.0–0.5)
Eosinophils Relative: 2 %
HCT: 32 % — ABNORMAL LOW (ref 36.0–46.0)
Hemoglobin: 9.7 g/dL — ABNORMAL LOW (ref 12.0–15.0)
Immature Granulocytes: 0 %
Lymphocytes Relative: 47 %
Lymphs Abs: 2.2 10*3/uL (ref 0.7–4.0)
MCH: 27.5 pg (ref 26.0–34.0)
MCHC: 30.3 g/dL (ref 30.0–36.0)
MCV: 90.7 fL (ref 80.0–100.0)
Monocytes Absolute: 0.5 10*3/uL (ref 0.1–1.0)
Monocytes Relative: 11 %
Neutro Abs: 1.8 10*3/uL (ref 1.7–7.7)
Neutrophils Relative %: 39 %
Platelets: 97 10*3/uL — ABNORMAL LOW (ref 150–400)
RBC: 3.53 MIL/uL — ABNORMAL LOW (ref 3.87–5.11)
RDW: 15.1 % (ref 11.5–15.5)
WBC: 4.6 10*3/uL (ref 4.0–10.5)
nRBC: 0 % (ref 0.0–0.2)

## 2020-05-17 LAB — COMPREHENSIVE METABOLIC PANEL
ALT: 25 U/L (ref 0–44)
AST: 30 U/L (ref 15–41)
Albumin: 3.8 g/dL (ref 3.5–5.0)
Alkaline Phosphatase: 110 U/L (ref 38–126)
Anion gap: 10 (ref 5–15)
BUN: 29 mg/dL — ABNORMAL HIGH (ref 8–23)
CO2: 32 mmol/L (ref 22–32)
Calcium: 9.3 mg/dL (ref 8.9–10.3)
Chloride: 103 mmol/L (ref 98–111)
Creatinine, Ser: 1.28 mg/dL — ABNORMAL HIGH (ref 0.44–1.00)
GFR calc Af Amer: 51 mL/min — ABNORMAL LOW (ref >60)
GFR calc non Af Amer: 44 mL/min — ABNORMAL LOW (ref >60)
Glucose, Bld: 88 mg/dL (ref 70–99)
Potassium: 3.6 mmol/L (ref 3.5–5.1)
Sodium: 145 mmol/L (ref 135–145)
Total Bilirubin: 0.5 mg/dL (ref 0.3–1.2)
Total Protein: 7 g/dL (ref 6.5–8.1)

## 2020-05-17 MED ORDER — HEPARIN SOD (PORK) LOCK FLUSH 100 UNIT/ML IV SOLN
INTRAVENOUS | Status: AC
  Filled 2020-05-17: qty 5

## 2020-05-17 MED ORDER — SODIUM CHLORIDE 0.9% FLUSH
10.0000 mL | INTRAVENOUS | Status: DC | PRN
  Administered 2020-05-17: 10 mL via INTRAVENOUS
  Filled 2020-05-17: qty 10

## 2020-05-17 MED ORDER — HEPARIN SOD (PORK) LOCK FLUSH 100 UNIT/ML IV SOLN
500.0000 [IU] | Freq: Once | INTRAVENOUS | Status: AC
  Administered 2020-05-17: 500 [IU] via INTRAVENOUS
  Filled 2020-05-17: qty 5

## 2020-05-17 NOTE — Progress Notes (Signed)
1030: Per Judeen Hammans, RN, Per Dr. Janese Banks patient to systolic BP should be below 160 before giving treatment. Dr. Janese Banks okay with PLT 97.   1040: Patient took BP medication  1115: Rechecked BP: 200/80 HR 59. Informed Dr. Janese Banks and team.   1120: Per Judeen Hammans, RN, Per Dr. Janese Banks, No treatment today, patient to be rescheduled.   Encouraged patient to contact her PCP regarding her BP and encouraged her to monitor it closely at home. Educated patient as to when she should seek emergency treatment. Patient verbalizes understanding and denies any further questions or concerns.

## 2020-05-19 ENCOUNTER — Encounter: Payer: Self-pay | Admitting: Oncology

## 2020-05-19 NOTE — Progress Notes (Signed)
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 It showed 2 suspicious lesions in the right breast. Diagnostic mammogram and ultrasound confirmed 2 masses in the 9 o'clock position of the right breast measuring 1.1 x 1.2 x 1.2 cm and the other one measuring 0.8 x 0.8 x 0.9 cm. These 2 masses were 1.7 cm apart. Sonographic evaluation of the right axilla did not show any enlarged adenopathy. 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  Baseline MUGA scan showed EF of 58%. MRI showed two distinct masses in the right breast 1.5 cm and 1.2 cm 2 cm apart no additional suspicious masses or abnormal enhancement identified. No suspicious adenopathy  6 cycles of neoadjuvant TCHP chemotherapy completed on 02/29/2020.Final pathology showed residual tumor of 5 mm.Negative margins. 1 sentinel lymph node negative for malignancy. Overall cancer cellularity 1%. Percentage of cancer that is in situ 0%. [id number]  Patient is  started on adjuvant Kadcyla on 04/26/2020  Interval history-patient reports ongoing fatigue.  She is undergoing acupuncture for her neuropathy.  She is currently on gabapentin 600 milligrams twice daily and that is working well for her.  Reports no significant diarrhea.  Reports having occasional aching pain in the bilateral legs for which she uses ibuprofen.  ECOG PS- 1 Pain scale- 3   Review of systems- Review of Systems  Constitutional: Positive for malaise/fatigue. Negative for chills, fever and weight loss.  HENT: Negative for congestion, ear discharge and nosebleeds.   Eyes: Negative for blurred vision.  Respiratory: Negative for cough, hemoptysis, sputum production, shortness of breath and wheezing.   Cardiovascular: Negative for chest pain, palpitations, orthopnea and claudication.  Gastrointestinal: Negative for abdominal pain, blood in stool, constipation, diarrhea, heartburn, melena, nausea and vomiting.  Genitourinary: Negative for dysuria, flank pain, frequency, hematuria and urgency.  Musculoskeletal: Negative for back pain, joint pain and myalgias.       Bilateral leg pain  Skin: Negative for rash.  Neurological: Positive for sensory change (Peripheral neuropathy). Negative for dizziness, tingling, focal weakness, seizures, weakness and headaches.  Endo/Heme/Allergies: Does not bruise/bleed easily.  Psychiatric/Behavioral: Negative for depression and suicidal ideas. The patient does not have insomnia.      Allergies  Allergen Reactions  . Statins Rash    Other reaction(s): Muscle Pain Possible hives also     Past Medical History:  Diagnosis Date  . Adverse effect of anesthesia    hard to awaken  . Anemia   . Arthritis   . Asthma   . Asthma without status asthmaticus    unspecified; Take advair each night  . Breast cancer ([place])   . Bronchitis   .  Constipation   . Diabetes ([place]) 2010  . Diabetes mellitus type 2, uncomplicated ([place])   . Diabetes mellitus  without complication ([place])   . Encounter for blood transfusion    hysterectomy 9-10 years ago  . Heart murmur    unspecified  . Hyperlipidemia   . Hypertension 1991  . Hypothyroid   . Multiple thyroid nodules   . Neuromuscular disorder ([facility])    neuropathy  . Osteoarthritis of right hip 02/04/2017  . Palpitations   . Pernicious anemia   . Pneumonia   . Sleep apnea    Use C-PAP; can't use mouth piece. Anxiety  . Thyroid disease    nodules  . Thyroid nodule    x2  . Urinary urgency      Past Surgical History:  Procedure Laterality Date  . ABDOMINAL HYSTERECTOMY  2011   partial  . ABDOMINAL SURGERY    . CARPAL TUNNEL RELEASE Left 2008  . CARPAL TUNNEL RELEASE Right   . CHOLECYSTECTOMY  1982  . COLONOSCOPY WITH PROPOFOL N/A 10/05/2017   Procedure: COLONOSCOPY WITH PROPOFOL;  Surgeon: Manya Silvas, MD;  Location: [facility] ENDOSCOPY;  Service: Endoscopy;  Laterality: N/A;  . DIAGNOSTIC LAPAROSCOPY    . JOINT REPLACEMENT Right 02/09/2017   TOTAL HIP REPLACEMENT, DR. CARTER. [place]  . KNEE ARTHROSCOPY  1990s  . PARTIAL MASTECTOMY WITH NEEDLE LOCALIZATION AND AXILLARY SENTINEL LYMPH NODE BX Right 03/28/2020   Procedure: PARTIAL MASTECTOMY WITH NEEDLE LOCALIZATION AND AXILLARY SENTINEL LYMPH NODE BX;  Surgeon: Herbert Pun, MD;  Location: [facility] ORS;  Service: General;  Laterality: Right;  . PORTACATH PLACEMENT Left 11/07/2019   Procedure: INSERTION PORT-A-CATH;  Surgeon: Herbert Pun, MD;  Location: [facility] ORS;  Service: General;  Laterality: Left;  . TOTAL HIP ARTHROPLASTY Left 06/23/2017   Procedure: TOTAL HIP ARTHROPLASTY ANTERIOR APPROACH;  Surgeon: Hessie Knows, MD;  Location: [facility] ORS;  Service: Orthopedics;  Laterality: Left;    Social History   Socioeconomic History  . Marital status: Married    Spouse name: Not on file  . Number of children: 2  . Years of education: some college  . Highest education level: Not on file  Occupational History  .  Occupation: retired    Comment: [employer]  Tobacco Use  . Smoking status: Never Smoker  . Smokeless tobacco: Never Used  Vaping Use  . Vaping Use: Never used  Substance and Sexual Activity  . Alcohol use: No  . Drug use: No  . Sexual activity: Not on file  Other Topics Concern  . Not on file  Social History Narrative  . Not on file   Social Determinants of Health   Financial Resource Strain:   . Difficulty of Paying Living Expenses:   Food Insecurity:   . Worried About Charity fundraiser in the Last Year:   . Arboriculturist in the Last Year:   Transportation Needs:   . Film/video editor (Medical):   Marland Kitchen Lack of Transportation (Non-Medical):   Physical Activity:   . Days of Exercise per Week:   . Minutes of Exercise per Session:   Stress:   . Feeling of Stress :   Social Connections:   . Frequency of Communication with Friends and Family:   . Frequency of Social Gatherings with Friends and Family:   . Attends Religious Services:   . Active Member of Clubs or Organizations:   . Attends Archivist Meetings:   Marland Kitchen Marital Status:  Intimate Partner Violence:   . Fear of Current or Ex-Partner:   . Emotionally Abused:   Marland Kitchen Physically Abused:   . Sexually Abused:     Family History  Problem Relation Age of Onset  . Heart disease Mother   . Hypertension Mother   . Arrhythmia Mother   . Heart attack Mother   . Diabetes Mother   . Hypertension Father   . Parkinson's disease Father   . Coronary artery disease Maternal Uncle   . Heart attack Maternal Uncle   . Diabetes Sister   . Diabetes Brother   . Heart disease Brother   . Breast cancer Neg Hx      Current Outpatient Medications:  .  acetaminophen (TYLENOL) 500 MG tablet, Take 500 mg by mouth every 6 (six) hours as needed., Disp: , Rfl:  .  albuterol (PROVENTIL HFA;VENTOLIN HFA) 108 (90 Base) MCG/ACT inhaler, Inhale 2 puffs into the lungs every 6 (six) hours as needed for wheezing  or shortness of breath., Disp: , Rfl:  .  carvedilol (COREG) 12.5 MG tablet, Take 12.5 mg by mouth 2 (two) times daily. , Disp: , Rfl:  .  cholecalciferol (VITAMIN D) 1000 units tablet, Take 1,000 Units by mouth daily., Disp: , Rfl:  .  Fluticasone-Salmeterol (ADVAIR) 250-50 MCG/DOSE AEPB, Inhale 1 puff into the lungs 2 (two) times daily. , Disp: , Rfl:  .  gabapentin (NEURONTIN) 300 MG capsule, Take 1 capsule (300 mg total) by mouth as directed. Take 1 tablet at night for 3 days and then 1 tablet in am and at night for 3 days and then 1 tablet three times a day for rest of month, Disp: 59 capsule, Rfl: 0 .  lidocaine-prilocaine (EMLA) cream, Apply 1 application topically as needed., Disp: 30 g, Rfl: 0 .  loperamide (IMODIUM) 2 MG capsule, Take 2 mg by mouth as needed for diarrhea or loose stools., Disp: , Rfl:  .  loratadine (CLARITIN) 10 MG tablet, Take 10 mg by mouth daily as needed for allergies., Disp: , Rfl:  .  metFORMIN (GLUCOPHAGE-XR) 500 MG 24 hr tablet, Take 500 mg by mouth daily with supper. , Disp: , Rfl:  .  pantoprazole (PROTONIX) 20 MG tablet, Take 1 tablet (20 mg total) by mouth 2 (two) times daily before a meal., Disp: 60 tablet, Rfl: 2 .  potassium chloride (KLOR-CON) 20 MEQ packet, Take 20 mEq by mouth daily., Disp: 90 packet, Rfl: 0 .  solifenacin (VESICARE) 5 MG tablet, Take 10 mg by mouth every evening. , Disp: , Rfl:  .  telmisartan-hydrochlorothiazide (MICARDIS HCT) 80-25 MG tablet, Take 1 tablet by mouth every morning. , Disp: , Rfl:  .  triamcinolone cream (KENALOG) 0.5 %, Apply 1 application topically as needed. , Disp: , Rfl:  .  diphenoxylate-atropine (LOMOTIL) 2.5-0.025 MG tablet, Take 1 tablet by mouth 4 (four) times daily as needed for diarrhea or loose stools. (Patient not taking: Reported on 05/17/2020), Disp: 60 tablet, Rfl: 0 .  traMADol (ULTRAM) 50 MG tablet, Take 1 tablet (50 mg total) by mouth every 6 (six) hours as needed. (Patient not taking: Reported on  05/17/2020), Disp: 30 tablet, Rfl: 0 No current facility-administered medications for this visit.  Facility-Administered Medications Ordered in Other Visits:  .  sodium chloride flush (NS) 0.9 % injection 10 mL, 10 mL, Intravenous, PRN, Sindy Guadeloupe, MD, 10 mL at 01/05/20 1344 .  sodium chloride flush (NS) 0.9 % injection 10 mL, 10 mL, Intravenous, PRN, Janese Banks,  Archana C, MD, 10 mL at 05/17/20 0858  Physical exam:  Vitals:   05/17/20 0953 05/17/20 0959  BP:  (S) (!) 202/76  Pulse: 62   Resp: 16   Temp: (!) 94.1 F (34.5 C)   TempSrc: Tympanic   SpO2: 100%   Weight: 184 lb 9.6 oz (83.7 kg)    Physical Exam Constitutional:      General: She is not in acute distress.    Comments: Appears fatigued  Cardiovascular:     Rate and Rhythm: Normal rate and regular rhythm.     Heart sounds: Normal heart sounds.  Pulmonary:     Effort: Pulmonary effort is normal.     Breath sounds: Normal breath sounds.  Abdominal:     General: Bowel sounds are normal.     Palpations: Abdomen is soft.  Skin:    General: Skin is warm and dry.  Neurological:     Mental Status: She is alert and oriented to person, place, and time.      CMP Latest Ref Rng & Units 05/17/2020  Glucose 70 - 99 mg/dL 88  BUN 8 - 23 mg/dL 29(H)  Creatinine 0.44 - 1.00 mg/dL 1.28(H)  Sodium 135 - 145 mmol/L 145  Potassium 3.5 - 5.1 mmol/L 3.6  Chloride 98 - 111 mmol/L 103  CO2 22 - 32 mmol/L 32  Calcium 8.9 - 10.3 mg/dL 9.3  Total Protein 6.5 - 8.1 g/dL 7.0  Total Bilirubin 0.3 - 1.2 mg/dL 0.5  Alkaline Phos 38 - 126 U/L 110  AST 15 - 41 U/L 30  ALT 0 - 44 U/L 25   CBC Latest Ref Rng & Units 05/17/2020  WBC 4.0 - 10.5 K/uL 4.6  Hemoglobin 12.0 - 15.0 g/dL 9.7(L)  Hematocrit 36 - 46 % 32.0(L)  Platelets 150 - 400 K/uL 97(L)    No images are attached to the encounter.  NM Cardiac Muga Rest  Result Date: 05/11/2020 CLINICAL DATA:  Breast cancer. Evaluate cardiac function in relation to chemotherapy. EXAM: NUCLEAR  MEDICINE CARDIAC BLOOD POOL IMAGING (MUGA) TECHNIQUE: Cardiac multi-gated acquisition was performed at rest following intravenous injection of Tc-99m labeled red blood cells. RADIOPHARMACEUTICALS:  22.5 mCi Tc-99m pertechnetate in-vitro labeled red blood cells IV COMPARISON:  02/02/2020 FINDINGS: No  focal wall motion abnormality of the left ventricle. Calculated left ventricular ejection fraction equals 65.2% Comparable to 65% on 02/02/2020. IMPRESSION: Left ventricular ejection fraction equals65.2 %. Electronically Signed   By: Stewart  Edmunds M.D.   On: 05/11/2020 14:03     Assessment and plan- Patient is a 64 y.o. female  withinvasive mammary carcinoma of the right breast clinical prognostic stage IamcT1 ccN0 cM0 ER/PR positive HER-2/neu positive.  She is s/p 6 cycles of neoadjuvant TCHP chemotherapy with residual 5 mm tumor.    Patient was started on adjuvant Kadcyla and is here for on treatment assessment prior to cycle 2 of Kadcyla  Patient's blood pressure is high with systolic blood pressure in the 200s.  Patient did not take her blood pressure medicine this morning.  We are therefore holding her treatment today.  Also her platelets are low at 97 today.   She will come for Kadcyla next week and will receive the same dose at 3.7 square kilograms.  If platelet counts remain consistently less than 75 total dose reduce it to 3 mg/kg.  I will see her back in 4 weeks for cycle 3.Patient had a recent MUGA scan on 05/08/2020 which continues to show normal EF   of 65%.  Chemo-induced peripheral neuropathy: She is undergoing acupuncture and will continue gabapentin.  Overall stable symptoms.  Bilateral leg pain: Possibly secondary to Kadcyla.  I have advised the patient not to take ibuprofen and instead try as needed tramadol which patient states she has at home.  She has mildly abnormal kidney functions which I would not like them to be worsened with NSAIDs.  Anemia: Likely secondary to prior  chemotherapy.  Continue to monitor   Visit Diagnosis 1. Invasive carcinoma of breast (HCC)   2. Encounter for monoclonal antibody treatment for malignancy   3. Chemotherapy-induced peripheral neuropathy (HCC)      Dr. Archana Rao, MD, MPH [facility] [phone] 05/19/2020 7:51 AM                

## 2020-05-21 ENCOUNTER — Ambulatory Visit: Admission: RE | Admit: 2020-05-21 | Payer: Medicare Other | Source: Ambulatory Visit

## 2020-05-21 ENCOUNTER — Other Ambulatory Visit: Payer: Self-pay

## 2020-05-21 DIAGNOSIS — Z510 Encounter for antineoplastic radiation therapy: Secondary | ICD-10-CM | POA: Diagnosis not present

## 2020-05-21 MED ORDER — GABAPENTIN 300 MG PO CAPS: 300.0000 mg | ORAL_CAPSULE | ORAL | 0 refills | Status: DC

## 2020-05-22 ENCOUNTER — Ambulatory Visit
Admission: RE | Admit: 2020-05-22 | Discharge: 2020-05-22 | Disposition: A | Discharge status: Home / Self Care | Payer: Medicare Other | Source: Ambulatory Visit | Attending: Radiation Oncology | Admitting: Radiation Oncology

## 2020-05-22 DIAGNOSIS — Z510 Encounter for antineoplastic radiation therapy: Secondary | ICD-10-CM | POA: Diagnosis not present

## 2020-05-23 ENCOUNTER — Ambulatory Visit
Admission: RE | Admit: 2020-05-23 | Discharge: 2020-05-23 | Disposition: A | Discharge status: Home / Self Care | Payer: Medicare Other | Source: Ambulatory Visit | Attending: Radiation Oncology | Admitting: Radiation Oncology

## 2020-05-23 DIAGNOSIS — Z510 Encounter for antineoplastic radiation therapy: Secondary | ICD-10-CM | POA: Diagnosis not present

## 2020-05-24 ENCOUNTER — Ambulatory Visit
Admission: RE | Admit: 2020-05-24 | Discharge: 2020-05-24 | Disposition: A | Discharge status: Home / Self Care | Payer: Medicare Other | Source: Ambulatory Visit | Attending: Radiation Oncology | Admitting: Radiation Oncology

## 2020-05-24 DIAGNOSIS — Z170 Estrogen receptor positive status [ER+]: Secondary | ICD-10-CM | POA: Insufficient documentation

## 2020-05-24 DIAGNOSIS — C50111 Malignant neoplasm of central portion of right female breast: Secondary | ICD-10-CM | POA: Diagnosis present

## 2020-05-24 DIAGNOSIS — Z510 Encounter for antineoplastic radiation therapy: Secondary | ICD-10-CM | POA: Insufficient documentation

## 2020-05-25 ENCOUNTER — Ambulatory Visit
Admission: RE | Admit: 2020-05-25 | Discharge: 2020-05-25 | Disposition: A | Discharge status: Home / Self Care | Payer: Medicare Other | Source: Ambulatory Visit | Attending: Radiation Oncology | Admitting: Radiation Oncology

## 2020-05-25 ENCOUNTER — Inpatient Hospital Stay: Payer: Medicare Other

## 2020-05-25 ENCOUNTER — Other Ambulatory Visit: Payer: Self-pay

## 2020-05-25 ENCOUNTER — Inpatient Hospital Stay: Payer: Medicare Other | Attending: Oncology

## 2020-05-25 DIAGNOSIS — D701 Agranulocytosis secondary to cancer chemotherapy: Secondary | ICD-10-CM | POA: Insufficient documentation

## 2020-05-25 DIAGNOSIS — Z5112 Encounter for antineoplastic immunotherapy: Secondary | ICD-10-CM

## 2020-05-25 DIAGNOSIS — G620 Drug-induced polyneuropathy: Secondary | ICD-10-CM | POA: Insufficient documentation

## 2020-05-25 DIAGNOSIS — C50911 Malignant neoplasm of unspecified site of right female breast: Secondary | ICD-10-CM | POA: Diagnosis present

## 2020-05-25 DIAGNOSIS — Z510 Encounter for antineoplastic radiation therapy: Secondary | ICD-10-CM | POA: Diagnosis not present

## 2020-05-25 DIAGNOSIS — Z170 Estrogen receptor positive status [ER+]: Secondary | ICD-10-CM | POA: Insufficient documentation

## 2020-05-25 DIAGNOSIS — D6959 Other secondary thrombocytopenia: Secondary | ICD-10-CM | POA: Diagnosis not present

## 2020-05-25 DIAGNOSIS — C50919 Malignant neoplasm of unspecified site of unspecified female breast: Secondary | ICD-10-CM

## 2020-05-25 LAB — CBC WITH DIFFERENTIAL/PLATELET
Abs Immature Granulocytes: 0 10*3/uL (ref 0.00–0.07)
Basophils Absolute: 0 10*3/uL (ref 0.0–0.1)
Basophils Relative: 1 %
Eosinophils Absolute: 0 10*3/uL (ref 0.0–0.5)
Eosinophils Relative: 1 %
HCT: 30.9 % — ABNORMAL LOW (ref 36.0–46.0)
Hemoglobin: 9.5 g/dL — ABNORMAL LOW (ref 12.0–15.0)
Immature Granulocytes: 0 %
Lymphocytes Relative: 51 %
Lymphs Abs: 2 10*3/uL (ref 0.7–4.0)
MCH: 27.6 pg (ref 26.0–34.0)
MCHC: 30.7 g/dL (ref 30.0–36.0)
MCV: 89.8 fL (ref 80.0–100.0)
Monocytes Absolute: 0.4 10*3/uL (ref 0.1–1.0)
Monocytes Relative: 12 %
Neutro Abs: 1.3 10*3/uL — ABNORMAL LOW (ref 1.7–7.7)
Neutrophils Relative %: 35 %
Platelets: 96 10*3/uL — ABNORMAL LOW (ref 150–400)
RBC: 3.44 MIL/uL — ABNORMAL LOW (ref 3.87–5.11)
RDW: 15.1 % (ref 11.5–15.5)
WBC: 3.8 10*3/uL — ABNORMAL LOW (ref 4.0–10.5)
nRBC: 0 % (ref 0.0–0.2)

## 2020-05-25 LAB — COMPREHENSIVE METABOLIC PANEL
ALT: 17 U/L (ref 0–44)
AST: 23 U/L (ref 15–41)
Albumin: 3.8 g/dL (ref 3.5–5.0)
Alkaline Phosphatase: 103 U/L (ref 38–126)
Anion gap: 9 (ref 5–15)
BUN: 30 mg/dL — ABNORMAL HIGH (ref 8–23)
CO2: 32 mmol/L (ref 22–32)
Calcium: 9.3 mg/dL (ref 8.9–10.3)
Chloride: 101 mmol/L (ref 98–111)
Creatinine, Ser: 1.34 mg/dL — ABNORMAL HIGH (ref 0.44–1.00)
GFR calc Af Amer: 48 mL/min — ABNORMAL LOW (ref >60)
GFR calc non Af Amer: 41 mL/min — ABNORMAL LOW (ref >60)
Glucose, Bld: 84 mg/dL (ref 70–99)
Potassium: 3.7 mmol/L (ref 3.5–5.1)
Sodium: 142 mmol/L (ref 135–145)
Total Bilirubin: 0.6 mg/dL (ref 0.3–1.2)
Total Protein: 7.2 g/dL (ref 6.5–8.1)

## 2020-05-25 MED ORDER — HEPARIN SOD (PORK) LOCK FLUSH 100 UNIT/ML IV SOLN
500.0000 [IU] | Freq: Once | INTRAVENOUS | Status: AC
  Administered 2020-05-25: 500 [IU] via INTRAVENOUS
  Filled 2020-05-25: qty 5

## 2020-05-25 MED ORDER — HEPARIN SOD (PORK) LOCK FLUSH 100 UNIT/ML IV SOLN
INTRAVENOUS | Status: AC
  Filled 2020-05-25: qty 5

## 2020-05-29 ENCOUNTER — Other Ambulatory Visit: Payer: Self-pay | Admitting: *Deleted

## 2020-05-29 ENCOUNTER — Ambulatory Visit
Admission: RE | Admit: 2020-05-29 | Discharge: 2020-05-29 | Disposition: A | Discharge status: Home / Self Care | Payer: Medicare Other | Source: Ambulatory Visit | Attending: Radiation Oncology | Admitting: Radiation Oncology

## 2020-05-29 ENCOUNTER — Inpatient Hospital Stay: Payer: Medicare Other

## 2020-05-29 ENCOUNTER — Inpatient Hospital Stay (HOSPITAL_BASED_OUTPATIENT_CLINIC_OR_DEPARTMENT_OTHER): Payer: Medicare Other | Admitting: Oncology

## 2020-05-29 ENCOUNTER — Other Ambulatory Visit: Payer: Self-pay

## 2020-05-29 ENCOUNTER — Encounter: Payer: Self-pay | Admitting: Oncology

## 2020-05-29 VITALS — BP 175/78 | HR 56 | Temp 96.8°F | Wt 181.1 lb

## 2020-05-29 VITALS — BP 186/78

## 2020-05-29 DIAGNOSIS — Z510 Encounter for antineoplastic radiation therapy: Secondary | ICD-10-CM | POA: Diagnosis not present

## 2020-05-29 DIAGNOSIS — Z5112 Encounter for antineoplastic immunotherapy: Secondary | ICD-10-CM | POA: Diagnosis not present

## 2020-05-29 DIAGNOSIS — C50919 Malignant neoplasm of unspecified site of unspecified female breast: Secondary | ICD-10-CM | POA: Diagnosis not present

## 2020-05-29 DIAGNOSIS — G620 Drug-induced polyneuropathy: Secondary | ICD-10-CM

## 2020-05-29 DIAGNOSIS — D6959 Other secondary thrombocytopenia: Secondary | ICD-10-CM

## 2020-05-29 DIAGNOSIS — T451X5A Adverse effect of antineoplastic and immunosuppressive drugs, initial encounter: Secondary | ICD-10-CM

## 2020-05-29 DIAGNOSIS — D702 Other drug-induced agranulocytosis: Secondary | ICD-10-CM | POA: Diagnosis not present

## 2020-05-29 DIAGNOSIS — Z95828 Presence of other vascular implants and grafts: Secondary | ICD-10-CM

## 2020-05-29 DIAGNOSIS — T50905A Adverse effect of unspecified drugs, medicaments and biological substances, initial encounter: Secondary | ICD-10-CM

## 2020-05-29 LAB — CBC WITH DIFFERENTIAL/PLATELET
Abs Immature Granulocytes: 0.01 10*3/uL (ref 0.00–0.07)
Basophils Absolute: 0 10*3/uL (ref 0.0–0.1)
Basophils Relative: 1 %
Eosinophils Absolute: 0.1 10*3/uL (ref 0.0–0.5)
Eosinophils Relative: 2 %
HCT: 31.5 % — ABNORMAL LOW (ref 36.0–46.0)
Hemoglobin: 9.7 g/dL — ABNORMAL LOW (ref 12.0–15.0)
Immature Granulocytes: 0 %
Lymphocytes Relative: 49 %
Lymphs Abs: 1.9 10*3/uL (ref 0.7–4.0)
MCH: 27.6 pg (ref 26.0–34.0)
MCHC: 30.8 g/dL (ref 30.0–36.0)
MCV: 89.5 fL (ref 80.0–100.0)
Monocytes Absolute: 0.5 10*3/uL (ref 0.1–1.0)
Monocytes Relative: 13 %
Neutro Abs: 1.3 10*3/uL — ABNORMAL LOW (ref 1.7–7.7)
Neutrophils Relative %: 35 %
Platelets: 109 10*3/uL — ABNORMAL LOW (ref 150–400)
RBC: 3.52 MIL/uL — ABNORMAL LOW (ref 3.87–5.11)
RDW: 14.9 % (ref 11.5–15.5)
WBC: 3.8 10*3/uL — ABNORMAL LOW (ref 4.0–10.5)
nRBC: 0 % (ref 0.0–0.2)

## 2020-05-29 LAB — COMPREHENSIVE METABOLIC PANEL
ALT: 14 U/L (ref 0–44)
AST: 21 U/L (ref 15–41)
Albumin: 3.7 g/dL (ref 3.5–5.0)
Alkaline Phosphatase: 102 U/L (ref 38–126)
Anion gap: 9 (ref 5–15)
BUN: 27 mg/dL — ABNORMAL HIGH (ref 8–23)
CO2: 32 mmol/L (ref 22–32)
Calcium: 9.2 mg/dL (ref 8.9–10.3)
Chloride: 101 mmol/L (ref 98–111)
Creatinine, Ser: 1.45 mg/dL — ABNORMAL HIGH (ref 0.44–1.00)
GFR calc Af Amer: 44 mL/min — ABNORMAL LOW (ref >60)
GFR calc non Af Amer: 38 mL/min — ABNORMAL LOW (ref >60)
Glucose, Bld: 93 mg/dL (ref 70–99)
Potassium: 3.6 mmol/L (ref 3.5–5.1)
Sodium: 142 mmol/L (ref 135–145)
Total Bilirubin: 0.5 mg/dL (ref 0.3–1.2)
Total Protein: 7.1 g/dL (ref 6.5–8.1)

## 2020-05-29 MED ORDER — TRAMADOL HCL 50 MG PO TABS: 50.0000 mg | ORAL_TABLET | Freq: Four times a day (QID) | ORAL | 0 refills | Status: DC | PRN

## 2020-05-29 MED ORDER — PANTOPRAZOLE SODIUM 20 MG PO TBEC: 20.0000 mg | DELAYED_RELEASE_TABLET | Freq: Two times a day (BID) | ORAL | 2 refills | Status: DC

## 2020-05-29 MED ORDER — HEPARIN SOD (PORK) LOCK FLUSH 100 UNIT/ML IV SOLN
INTRAVENOUS | Status: AC
  Filled 2020-05-29: qty 5

## 2020-05-29 MED ORDER — SODIUM CHLORIDE 0.9 % IV SOLN
3.0000 mg/kg | Freq: Once | INTRAVENOUS | Status: AC
  Administered 2020-05-29: 240 mg via INTRAVENOUS
  Filled 2020-05-29: qty 8

## 2020-05-29 MED ORDER — DIPHENHYDRAMINE HCL 25 MG PO CAPS
50.0000 mg | ORAL_CAPSULE | Freq: Once | ORAL | Status: AC
  Administered 2020-05-29: 25 mg via ORAL
  Filled 2020-05-29: qty 2

## 2020-05-29 MED ORDER — HEPARIN SOD (PORK) LOCK FLUSH 100 UNIT/ML IV SOLN
500.0000 [IU] | Freq: Once | INTRAVENOUS | Status: AC | PRN
  Administered 2020-05-29: 500 [IU]
  Filled 2020-05-29: qty 5

## 2020-05-29 MED ORDER — SODIUM CHLORIDE 0.9 % IV SOLN
Freq: Once | INTRAVENOUS | Status: AC
  Filled 2020-05-29: qty 250

## 2020-05-29 MED ORDER — SODIUM CHLORIDE 0.9% FLUSH
10.0000 mL | INTRAVENOUS | Status: DC | PRN
  Administered 2020-05-29: 10 mL via INTRAVENOUS
  Filled 2020-05-29: qty 10

## 2020-05-29 MED ORDER — ACETAMINOPHEN 325 MG PO TABS
650.0000 mg | ORAL_TABLET | Freq: Once | ORAL | Status: AC
  Administered 2020-05-29: 650 mg via ORAL
  Filled 2020-05-29: qty 2

## 2020-05-29 NOTE — Progress Notes (Signed)
Per Dr. Janese Banks,  patient can still receive Kadcyla with a ANC of 1.3 and BP of 186/78. Dr. Janese Banks would like for Ms. Whidden to restart nifedipine and see her PCP about her elevated BP. Patient verbalizes understanding. She tolerated her treatment well without any complications.

## 2020-05-29 NOTE — Progress Notes (Signed)
Hematology/Oncology Consult note 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 It showed 2 suspicious lesions in the right breast. Diagnostic mammogram and ultrasound confirmed 2 masses in the 9 o'clock position of the right breast measuring 1.1 x 1.2 x 1.2 cm and the other one measuring 0.8 x 0.8 x 0.9 cm. These 2 masses were 1.7 cm apart. Sonographic evaluation of the right axilla did not show any enlarged adenopathy. 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  Baseline MUGA scan showed EF of 58%. MRI showed two distinct masses in the right breast 1.5 cm and 1.2 cm 2 cm apart no additional suspicious masses or abnormal enhancement identified. No suspicious adenopathy  6 cycles of neoadjuvant TCHP chemotherapy completed on 02/29/2020.Final pathology showed residual tumor of 5 mm.Negative margins. 1 sentinel lymph node negative for malignancy. Overall cancer cellularity 1%. Percentage of cancer that is in situ 0%. [id number]  Patient is  started on adjuvant Kadcyla on 04/26/2020   Interval history-patient feels a little fatigued.  Denies any significant diarrhea.  Neuropathy remains currently stable with gabapentin.  ECOG PS- 1 Pain scale- 0   Review of systems- Review of Systems  Constitutional: Positive for malaise/fatigue. Negative for chills, fever and weight loss.  HENT: Negative for congestion, ear discharge and nosebleeds.   Eyes: Negative for blurred vision.  Respiratory: Negative for cough, hemoptysis, sputum production, shortness of breath and wheezing.   Cardiovascular: Negative for chest pain, palpitations, orthopnea and claudication.  Gastrointestinal: Negative for abdominal pain, blood in stool, constipation, diarrhea, heartburn, melena, nausea and vomiting.  Genitourinary: Negative for dysuria, flank pain, frequency, hematuria and urgency.  Musculoskeletal: Negative for back pain, joint pain and myalgias.  Skin: Negative for rash.  Neurological: Negative for dizziness, tingling, focal weakness, seizures, weakness and headaches.  Endo/Heme/Allergies: Does not bruise/bleed easily.  Psychiatric/Behavioral: Negative for depression and suicidal ideas. The patient does not have insomnia.       Allergies  Allergen Reactions  . Statins Rash    Other reaction(s): Muscle Pain Possible hives also     Past Medical History:  Diagnosis Date  . Adverse effect of anesthesia    hard to awaken  . Anemia   . Arthritis   . Asthma   . Asthma without status asthmaticus    unspecified; Take advair each night  . Breast cancer ([place])   . Bronchitis   . Constipation   . Diabetes ([place]) 2010  . Diabetes mellitus type 2, uncomplicated ([place])   . Diabetes mellitus without complication ([place])   . Encounter for blood transfusion    hysterectomy 9-10 years ago  . Heart murmur  unspecified  . Hyperlipidemia   . Hypertension 1991  . Hypothyroid   . Multiple thyroid nodules   . Neuromuscular disorder ([facility])     neuropathy  . Osteoarthritis of right hip 02/04/2017  . Palpitations   . Pernicious anemia   . Pneumonia   . Sleep apnea    Use C-PAP; can't use mouth piece. Anxiety  . Thyroid disease    nodules  . Thyroid nodule    x2  . Urinary urgency      Past Surgical History:  Procedure Laterality Date  . ABDOMINAL HYSTERECTOMY  2011   partial  . ABDOMINAL SURGERY    . CARPAL TUNNEL RELEASE Left 2008  . CARPAL TUNNEL RELEASE Right   . CHOLECYSTECTOMY  1982  . COLONOSCOPY WITH PROPOFOL N/A 10/05/2017   Procedure: COLONOSCOPY WITH PROPOFOL;  Surgeon: Manya Silvas, MD;  Location: [facility] ENDOSCOPY;  Service: Endoscopy;  Laterality: N/A;  . DIAGNOSTIC LAPAROSCOPY    . JOINT REPLACEMENT Right 02/09/2017   TOTAL HIP REPLACEMENT, DR. CARTER. [place]  . KNEE ARTHROSCOPY  1990s  . PARTIAL MASTECTOMY WITH NEEDLE LOCALIZATION AND AXILLARY SENTINEL LYMPH NODE BX Right 03/28/2020   Procedure: PARTIAL MASTECTOMY WITH NEEDLE LOCALIZATION AND AXILLARY SENTINEL LYMPH NODE BX;  Surgeon: Herbert Pun, MD;  Location: [facility] ORS;  Service: General;  Laterality: Right;  . PORTACATH PLACEMENT Left 11/07/2019   Procedure: INSERTION PORT-A-CATH;  Surgeon: Herbert Pun, MD;  Location: [facility] ORS;  Service: General;  Laterality: Left;  . TOTAL HIP ARTHROPLASTY Left 06/23/2017   Procedure: TOTAL HIP ARTHROPLASTY ANTERIOR APPROACH;  Surgeon: Hessie Knows, MD;  Location: [facility] ORS;  Service: Orthopedics;  Laterality: Left;    Social History   Socioeconomic History  . Marital status: Married    Spouse name: Not on file  . Number of children: 2  . Years of education: some college  . Highest education level: Not on file  Occupational History  . Occupation: retired    Comment: [employer]  Tobacco Use  . Smoking status: Never Smoker  . Smokeless tobacco: Never Used  Vaping Use  . Vaping Use: Never used  Substance and Sexual Activity  . Alcohol use: No  . Drug use: No  .  Sexual activity: Not on file  Other Topics Concern  . Not on file  Social History Narrative  . Not on file   Social Determinants of Health   Financial Resource Strain:   . Difficulty of Paying Living Expenses:   Food Insecurity:   . Worried About Charity fundraiser in the Last Year:   . Arboriculturist in the Last Year:   Transportation Needs:   . Film/video editor (Medical):   Marland Kitchen Lack of Transportation (Non-Medical):   Physical Activity:   . Days of Exercise per Week:   . Minutes of Exercise per Session:   Stress:   . Feeling of Stress :   Social Connections:   . Frequency of Communication with Friends and Family:   . Frequency of Social Gatherings with Friends and Family:   . Attends Religious Services:   . Active Member of Clubs or Organizations:   . Attends Archivist Meetings:   Marland Kitchen Marital Status:   Intimate Partner Violence:   . Fear of Current or Ex-Partner:   . Emotionally Abused:   Marland Kitchen Physically Abused:   . Sexually Abused:     Family History  Problem Relation Age of Onset  . Heart disease Mother   .  Hypertension Mother   . Arrhythmia Mother   . Heart attack Mother   . Diabetes Mother   . Hypertension Father   . Parkinson's disease Father   . Coronary artery disease Maternal Uncle   . Heart attack Maternal Uncle   . Diabetes Sister   . Diabetes Brother   . Heart disease Brother   . Breast cancer Neg Hx      Current Outpatient Medications:  .  acetaminophen (TYLENOL) 500 MG tablet, Take 500 mg by mouth every 6 (six) hours as needed., Disp: , Rfl:  .  albuterol (PROVENTIL HFA;VENTOLIN HFA) 108 (90 Base) MCG/ACT inhaler, Inhale 2 puffs into the lungs every 6 (six) hours as needed for wheezing or shortness of breath., Disp: , Rfl:  .  carvedilol (COREG) 12.5 MG tablet, Take 12.5 mg by mouth 2 (two) times daily. , Disp: , Rfl:  .  cholecalciferol (VITAMIN D) 1000 units tablet, Take 1,000 Units by mouth daily., Disp: , Rfl:  .   Fluticasone-Salmeterol (ADVAIR) 250-50 MCG/DOSE AEPB, Inhale 1 puff into the lungs 2 (two) times daily. , Disp: , Rfl:  .  gabapentin (NEURONTIN) 300 MG capsule, Take 1 capsule (300 mg total) by mouth as directed. Take 1 tablet at night for 3 days and then 1 tablet in am and at night for 3 days and then 1 tablet three times a day for rest of month, Disp: 59 capsule, Rfl: 0 .  lidocaine-prilocaine (EMLA) cream, Apply 1 application topically as needed., Disp: 30 g, Rfl: 0 .  loperamide (IMODIUM) 2 MG capsule, Take 2 mg by mouth as needed for diarrhea or loose stools., Disp: , Rfl:  .  loratadine (CLARITIN) 10 MG tablet, Take 10 mg by mouth daily as needed for allergies., Disp: , Rfl:  .  metFORMIN (GLUCOPHAGE-XR) 500 MG 24 hr tablet, Take 500 mg by mouth daily with supper. , Disp: , Rfl:  .  potassium chloride (KLOR-CON) 20 MEQ packet, Take 20 mEq by mouth daily., Disp: 90 packet, Rfl: 0 .  solifenacin (VESICARE) 5 MG tablet, Take 10 mg by mouth every evening. , Disp: , Rfl:  .  telmisartan-hydrochlorothiazide (MICARDIS HCT) 80-25 MG tablet, Take 1 tablet by mouth every morning. , Disp: , Rfl:  .  triamcinolone cream (KENALOG) 0.5 %, Apply 1 application topically as needed. , Disp: , Rfl:  .  diphenoxylate-atropine (LOMOTIL) 2.5-0.025 MG tablet, Take 1 tablet by mouth 4 (four) times daily as needed for diarrhea or loose stools. (Patient not taking: Reported on 05/17/2020), Disp: 60 tablet, Rfl: 0 .  pantoprazole (PROTONIX) 20 MG tablet, Take 1 tablet (20 mg total) by mouth 2 (two) times daily before a meal., Disp: 60 tablet, Rfl: 2 .  traMADol (ULTRAM) 50 MG tablet, Take 1 tablet (50 mg total) by mouth every 6 (six) hours as needed., Disp: 30 tablet, Rfl: 0 No current facility-administered medications for this visit.  Facility-Administered Medications Ordered in Other Visits:  .  ado-trastuzumab emtansine (KADCYLA) 240 mg in sodium chloride 0.9 % 250 mL chemo infusion, 3 mg/kg (Treatment Plan Recorded),  Intravenous, Once, Sindy Guadeloupe, MD, Last Rate: 524 mL/hr at 05/29/20 1139, 240 mg at 05/29/20 1139 .  heparin lock flush 100 unit/mL, 500 Units, Intracatheter, Once PRN, Sindy Guadeloupe, MD .  sodium chloride flush (NS) 0.9 % injection 10 mL, 10 mL, Intravenous, PRN, Sindy Guadeloupe, MD, 10 mL at 01/05/20 1344 .  sodium chloride flush (NS) 0.9 % injection 10 mL, 10 mL,  Intravenous, PRN, Sindy Guadeloupe, MD, 10 mL at 05/17/20 0858  Physical exam:  Vitals:   05/29/20 0944  BP: (!) 175/78  Pulse: (!) 56  Temp: (!) 96.8 F (36 C)  TempSrc: Tympanic  Weight: 181 lb 1.6 oz (82.1 kg)   Physical Exam Constitutional:      General: She is not in acute distress. Cardiovascular:     Rate and Rhythm: Normal rate and regular rhythm.     Heart sounds: Normal heart sounds.  Pulmonary:     Effort: Pulmonary effort is normal.     Breath sounds: Normal breath sounds.  Abdominal:     General: Bowel sounds are normal.     Palpations: Abdomen is soft.  Musculoskeletal:     Cervical back: No rigidity.  Skin:    General: Skin is warm and dry.  Neurological:     Mental Status: She is alert and oriented to person, place, and time.      CMP Latest Ref Rng & Units 05/29/2020  Glucose 70 - 99 mg/dL 93  BUN 8 - 23 mg/dL 27(H)  Creatinine 0.44 - 1.00 mg/dL 1.45(H)  Sodium 135 - 145 mmol/L 142  Potassium 3.5 - 5.1 mmol/L 3.6  Chloride 98 - 111 mmol/L 101  CO2 22 - 32 mmol/L 32  Calcium 8.9 - 10.3 mg/dL 9.2  Total Protein 6.5 - 8.1 g/dL 7.1  Total Bilirubin 0.3 - 1.2 mg/dL 0.5  Alkaline Phos 38 - 126 U/L 102  AST 15 - 41 U/L 21  ALT 0 - 44 U/L 14   CBC Latest Ref Rng & Units 05/29/2020  WBC 4.0 - 10.5 K/uL 3.8(L)  Hemoglobin 12.0 - 15.0 g/dL 9.7(L)  Hematocrit 36 - 46 % 31.5(L)  Platelets 150 - 400 K/uL 109(L)    No images are attached to the encounter.  NM Cardiac Muga Rest  Result Date: 05/11/2020 CLINICAL DATA:  Breast cancer. Evaluate cardiac function in relation to chemotherapy. EXAM:  NUCLEAR MEDICINE CARDIAC BLOOD POOL IMAGING (MUGA) TECHNIQUE: Cardiac multi-gated acquisition was performed at rest following intravenous injection of Tc-58mlabeled red blood cells. RADIOPHARMACEUTICALS:  22.5 mCi [id number] in-vitro labeled red blood cells IV COMPARISON:  02/02/2020 FINDINGS: No  focal wall motion abnormality of the left ventricle. Calculated left ventricular ejection fraction equals 65.2% Comparable to 65% on 02/02/2020. IMPRESSION: Left ventricular ejection fraction equals65.2 %. Electronically Signed   By: StSuzy Bouchard.D.   On: 05/11/2020 14:03     Assessment and plan- Patient is a [age].o. female withinvasive mammary carcinoma of the right breast clinical prognostic stage IamcT1 ccN0 cM0 ER/PR positive HER-2/neu positive.  She is s/p 6 cycles of neoadjuvant TCHP chemotherapy with residual 5 mm tumor.  She is here for on treatment assessment prior to cycle 2 of adjuvant Kadcyla  Patient at baseline has mild leukopenia and her ANC is between 2-3.  We were able to give her 6 cycles of TCHP chemotherapy because of on for Neulasta support.  Patient received 1 dose of adjuvant Kadcyla on 04/26/2020.  Her ANC today is 1.3 and platelet count of 109.  With an AN[place]f greater than 1 and platelets more than 75 she will proceed with cycle 2 of adjuvant Kadcyla today and I will give her a reduced dose of 3 mg/kg.  I will see her back in 3 weeks time with CBC with differential, CMP for cycle 3 if her counts permit.  If we keep encountering problems with significant pancytopenia with  Kadcyla I will plan to stop that and switch her back to Herceptin plus or minus Perjeta.  Patient did have significant diarrhea with combination Herceptin Perjeta and I may be inclined to give her single agent Herceptin to complete 1 year of treatment if I am unable to give Kadcyla  Chemo-induced peripheral neuropathy: Continue gabapentin  Hypertension: Have asked her to get in touch with Dr. Ouida Sills  if more medications could be added to her regimen.  Patient states that she takes nifedipine on an as-needed basis.  I explained to the patient the blood pressure medicine should not be taken as needed and she would need another drug to be added to her regimen on a standing basis to control her blood pressure better.  Patient will be completing her radiation treatment on 06/20/2020.  Given that she has ER positive disease she would also benefit from hormone therapy for 5 years if not 10 years.  I will discuss this in further detail at her next visit    Visit Diagnosis 1. Invasive carcinoma of breast ([place])   2. Encounter for monoclonal antibody treatment for malignancy   3. Drug-induced neutropenia (HCC)   4. Drug-induced thrombocytopenia      Dr. Randa Evens, MD, MPH [facility] at [facility] [phone] 05/29/2020 11:51 AM

## 2020-05-29 NOTE — Progress Notes (Signed)
Patient here today for follow up and treatment consideration today regarding breast cancer. Patient denies concerns today.

## 2020-05-30 ENCOUNTER — Ambulatory Visit
Admission: RE | Admit: 2020-05-30 | Discharge: 2020-05-30 | Disposition: A | Discharge status: Home / Self Care | Payer: Medicare Other | Source: Ambulatory Visit | Attending: Radiation Oncology | Admitting: Radiation Oncology

## 2020-05-30 DIAGNOSIS — Z510 Encounter for antineoplastic radiation therapy: Secondary | ICD-10-CM | POA: Diagnosis not present

## 2020-05-31 ENCOUNTER — Ambulatory Visit
Admission: RE | Admit: 2020-05-31 | Discharge: 2020-05-31 | Disposition: A | Discharge status: Home / Self Care | Payer: Medicare Other | Source: Ambulatory Visit | Attending: Radiation Oncology | Admitting: Radiation Oncology

## 2020-05-31 DIAGNOSIS — Z510 Encounter for antineoplastic radiation therapy: Secondary | ICD-10-CM | POA: Diagnosis not present

## 2020-06-01 ENCOUNTER — Ambulatory Visit
Admission: RE | Admit: 2020-06-01 | Discharge: 2020-06-01 | Disposition: A | Discharge status: Home / Self Care | Payer: Medicare Other | Source: Ambulatory Visit | Attending: Radiation Oncology | Admitting: Radiation Oncology

## 2020-06-01 DIAGNOSIS — Z510 Encounter for antineoplastic radiation therapy: Secondary | ICD-10-CM | POA: Diagnosis not present

## 2020-06-04 ENCOUNTER — Ambulatory Visit
Admission: RE | Admit: 2020-06-04 | Discharge: 2020-06-04 | Disposition: A | Discharge status: Home / Self Care | Payer: Medicare Other | Source: Ambulatory Visit | Attending: Radiation Oncology | Admitting: Radiation Oncology

## 2020-06-04 DIAGNOSIS — Z510 Encounter for antineoplastic radiation therapy: Secondary | ICD-10-CM | POA: Diagnosis not present

## 2020-06-05 ENCOUNTER — Ambulatory Visit
Admission: RE | Admit: 2020-06-05 | Discharge: 2020-06-05 | Disposition: A | Discharge status: Home / Self Care | Payer: Medicare Other | Source: Ambulatory Visit | Attending: Radiation Oncology | Admitting: Radiation Oncology

## 2020-06-05 DIAGNOSIS — Z510 Encounter for antineoplastic radiation therapy: Secondary | ICD-10-CM | POA: Diagnosis not present

## 2020-06-06 ENCOUNTER — Ambulatory Visit
Admission: RE | Admit: 2020-06-06 | Discharge: 2020-06-06 | Disposition: A | Discharge status: Home / Self Care | Payer: Medicare Other | Source: Ambulatory Visit | Attending: Radiation Oncology | Admitting: Radiation Oncology

## 2020-06-06 ENCOUNTER — Other Ambulatory Visit: Payer: Self-pay | Admitting: Internal Medicine

## 2020-06-06 DIAGNOSIS — R29898 Other symptoms and signs involving the musculoskeletal system: Secondary | ICD-10-CM

## 2020-06-06 DIAGNOSIS — Z510 Encounter for antineoplastic radiation therapy: Secondary | ICD-10-CM | POA: Diagnosis not present

## 2020-06-07 ENCOUNTER — Other Ambulatory Visit: Payer: Medicare Other

## 2020-06-07 ENCOUNTER — Ambulatory Visit: Payer: Medicare Other

## 2020-06-07 ENCOUNTER — Ambulatory Visit: Payer: Medicare Other | Admitting: Oncology

## 2020-06-07 ENCOUNTER — Ambulatory Visit
Admission: RE | Admit: 2020-06-07 | Discharge: 2020-06-07 | Disposition: A | Discharge status: Home / Self Care | Payer: Medicare Other | Source: Ambulatory Visit | Attending: Radiation Oncology | Admitting: Radiation Oncology

## 2020-06-07 DIAGNOSIS — Z510 Encounter for antineoplastic radiation therapy: Secondary | ICD-10-CM | POA: Diagnosis not present

## 2020-06-08 ENCOUNTER — Ambulatory Visit
Admission: RE | Admit: 2020-06-08 | Discharge: 2020-06-08 | Disposition: A | Discharge status: Home / Self Care | Payer: Medicare Other | Source: Ambulatory Visit | Attending: Radiation Oncology | Admitting: Radiation Oncology

## 2020-06-08 DIAGNOSIS — Z510 Encounter for antineoplastic radiation therapy: Secondary | ICD-10-CM | POA: Diagnosis not present

## 2020-06-11 ENCOUNTER — Ambulatory Visit
Admission: RE | Admit: 2020-06-11 | Discharge: 2020-06-11 | Disposition: A | Discharge status: Home / Self Care | Payer: Medicare Other | Source: Ambulatory Visit | Attending: Radiation Oncology | Admitting: Radiation Oncology

## 2020-06-11 DIAGNOSIS — Z510 Encounter for antineoplastic radiation therapy: Secondary | ICD-10-CM | POA: Diagnosis not present

## 2020-06-12 ENCOUNTER — Ambulatory Visit
Admission: RE | Admit: 2020-06-12 | Discharge: 2020-06-12 | Disposition: A | Discharge status: Home / Self Care | Payer: Medicare Other | Source: Ambulatory Visit | Attending: Radiation Oncology | Admitting: Radiation Oncology

## 2020-06-12 DIAGNOSIS — Z510 Encounter for antineoplastic radiation therapy: Secondary | ICD-10-CM | POA: Diagnosis not present

## 2020-06-13 ENCOUNTER — Ambulatory Visit
Admission: RE | Admit: 2020-06-13 | Discharge: 2020-06-13 | Disposition: A | Discharge status: Home / Self Care | Payer: Medicare Other | Source: Ambulatory Visit | Attending: Radiation Oncology | Admitting: Radiation Oncology

## 2020-06-13 DIAGNOSIS — Z510 Encounter for antineoplastic radiation therapy: Secondary | ICD-10-CM | POA: Diagnosis not present

## 2020-06-14 ENCOUNTER — Ambulatory Visit: Payer: Medicare Other

## 2020-06-14 ENCOUNTER — Ambulatory Visit
Admission: RE | Admit: 2020-06-14 | Discharge: 2020-06-14 | Disposition: A | Discharge status: Home / Self Care | Payer: Medicare Other | Source: Ambulatory Visit | Attending: Radiation Oncology | Admitting: Radiation Oncology

## 2020-06-14 ENCOUNTER — Other Ambulatory Visit: Payer: Medicare Other

## 2020-06-14 ENCOUNTER — Ambulatory Visit: Payer: Medicare Other | Admitting: Oncology

## 2020-06-14 DIAGNOSIS — Z510 Encounter for antineoplastic radiation therapy: Secondary | ICD-10-CM | POA: Diagnosis not present

## 2020-06-15 ENCOUNTER — Ambulatory Visit
Admission: RE | Admit: 2020-06-15 | Discharge: 2020-06-15 | Disposition: A | Discharge status: Home / Self Care | Payer: Medicare Other | Source: Ambulatory Visit | Attending: Radiation Oncology | Admitting: Radiation Oncology

## 2020-06-15 DIAGNOSIS — Z510 Encounter for antineoplastic radiation therapy: Secondary | ICD-10-CM | POA: Diagnosis not present

## 2020-06-18 ENCOUNTER — Ambulatory Visit
Admission: RE | Admit: 2020-06-18 | Discharge: 2020-06-18 | Disposition: A | Discharge status: Home / Self Care | Payer: Medicare Other | Source: Ambulatory Visit | Attending: Radiation Oncology | Admitting: Radiation Oncology

## 2020-06-18 DIAGNOSIS — Z510 Encounter for antineoplastic radiation therapy: Secondary | ICD-10-CM | POA: Diagnosis not present

## 2020-06-19 ENCOUNTER — Other Ambulatory Visit: Payer: Self-pay | Admitting: Oncology

## 2020-06-19 ENCOUNTER — Encounter: Payer: Self-pay | Admitting: Oncology

## 2020-06-19 ENCOUNTER — Ambulatory Visit
Admission: RE | Admit: 2020-06-19 | Discharge: 2020-06-19 | Disposition: A | Discharge status: Home / Self Care | Payer: Medicare Other | Source: Ambulatory Visit | Attending: Radiation Oncology | Admitting: Radiation Oncology

## 2020-06-19 ENCOUNTER — Other Ambulatory Visit: Payer: Self-pay

## 2020-06-19 ENCOUNTER — Inpatient Hospital Stay: Payer: Medicare Other

## 2020-06-19 ENCOUNTER — Other Ambulatory Visit: Payer: Medicare Other

## 2020-06-19 ENCOUNTER — Inpatient Hospital Stay (HOSPITAL_BASED_OUTPATIENT_CLINIC_OR_DEPARTMENT_OTHER): Payer: Medicare Other | Admitting: Oncology

## 2020-06-19 VITALS — BP 152/65 | HR 73 | Temp 97.4°F | Wt 183.9 lb

## 2020-06-19 DIAGNOSIS — D6959 Other secondary thrombocytopenia: Secondary | ICD-10-CM | POA: Diagnosis not present

## 2020-06-19 DIAGNOSIS — Z79899 Other long term (current) drug therapy: Secondary | ICD-10-CM

## 2020-06-19 DIAGNOSIS — Z510 Encounter for antineoplastic radiation therapy: Secondary | ICD-10-CM | POA: Diagnosis not present

## 2020-06-19 DIAGNOSIS — T50905A Adverse effect of unspecified drugs, medicaments and biological substances, initial encounter: Secondary | ICD-10-CM

## 2020-06-19 DIAGNOSIS — C50919 Malignant neoplasm of unspecified site of unspecified female breast: Secondary | ICD-10-CM

## 2020-06-19 DIAGNOSIS — Z95828 Presence of other vascular implants and grafts: Secondary | ICD-10-CM

## 2020-06-19 DIAGNOSIS — G620 Drug-induced polyneuropathy: Secondary | ICD-10-CM

## 2020-06-19 DIAGNOSIS — Z780 Asymptomatic menopausal state: Secondary | ICD-10-CM

## 2020-06-19 DIAGNOSIS — Z5112 Encounter for antineoplastic immunotherapy: Secondary | ICD-10-CM | POA: Diagnosis not present

## 2020-06-19 DIAGNOSIS — T451X5A Adverse effect of antineoplastic and immunosuppressive drugs, initial encounter: Secondary | ICD-10-CM

## 2020-06-19 LAB — CBC WITH DIFFERENTIAL/PLATELET
Abs Immature Granulocytes: 0.01 10*3/uL (ref 0.00–0.07)
Basophils Absolute: 0 10*3/uL (ref 0.0–0.1)
Basophils Relative: 1 %
Eosinophils Absolute: 0.1 10*3/uL (ref 0.0–0.5)
Eosinophils Relative: 2 %
HCT: 31.5 % — ABNORMAL LOW (ref 36.0–46.0)
Hemoglobin: 9.9 g/dL — ABNORMAL LOW (ref 12.0–15.0)
Immature Granulocytes: 0 %
Lymphocytes Relative: 36 %
Lymphs Abs: 1.3 10*3/uL (ref 0.7–4.0)
MCH: 27.6 pg (ref 26.0–34.0)
MCHC: 31.4 g/dL (ref 30.0–36.0)
MCV: 87.7 fL (ref 80.0–100.0)
Monocytes Absolute: 0.5 10*3/uL (ref 0.1–1.0)
Monocytes Relative: 16 %
Neutro Abs: 1.6 10*3/uL — ABNORMAL LOW (ref 1.7–7.7)
Neutrophils Relative %: 45 %
Platelets: 79 10*3/uL — ABNORMAL LOW (ref 150–400)
RBC: 3.59 MIL/uL — ABNORMAL LOW (ref 3.87–5.11)
RDW: 15.2 % (ref 11.5–15.5)
WBC: 3.5 10*3/uL — ABNORMAL LOW (ref 4.0–10.5)
nRBC: 0 % (ref 0.0–0.2)

## 2020-06-19 LAB — COMPREHENSIVE METABOLIC PANEL
ALT: 16 U/L (ref 0–44)
AST: 24 U/L (ref 15–41)
Albumin: 3.8 g/dL (ref 3.5–5.0)
Alkaline Phosphatase: 96 U/L (ref 38–126)
Anion gap: 8 (ref 5–15)
BUN: 30 mg/dL — ABNORMAL HIGH (ref 8–23)
CO2: 33 mmol/L — ABNORMAL HIGH (ref 22–32)
Calcium: 9.5 mg/dL (ref 8.9–10.3)
Chloride: 101 mmol/L (ref 98–111)
Creatinine, Ser: 1.43 mg/dL — ABNORMAL HIGH (ref 0.44–1.00)
GFR calc Af Amer: 44 mL/min — ABNORMAL LOW (ref >60)
GFR calc non Af Amer: 38 mL/min — ABNORMAL LOW (ref >60)
Glucose, Bld: 102 mg/dL — ABNORMAL HIGH (ref 70–99)
Potassium: 3.6 mmol/L (ref 3.5–5.1)
Sodium: 142 mmol/L (ref 135–145)
Total Bilirubin: 0.6 mg/dL (ref 0.3–1.2)
Total Protein: 6.9 g/dL (ref 6.5–8.1)

## 2020-06-19 MED ORDER — LETROZOLE 2.5 MG PO TABS
2.5000 mg | ORAL_TABLET | Freq: Every day | ORAL | 3 refills | Status: DC
Start: 2020-06-19 — End: 2020-07-23

## 2020-06-19 MED ORDER — SODIUM CHLORIDE 0.9% FLUSH
10.0000 mL | INTRAVENOUS | Status: DC | PRN
  Administered 2020-06-19: 10 mL via INTRAVENOUS
  Filled 2020-06-19: qty 10

## 2020-06-19 NOTE — Progress Notes (Signed)
Hematology/Oncology Consult note 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 It showed 2 suspicious lesions in the right breast. Diagnostic mammogram and ultrasound confirmed 2 masses in the 9 o'clock position of the right breast measuring 1.1 x 1.2 x 1.2 cm and the other one measuring 0.8 x 0.8 x 0.9 cm. These 2 masses were 1.7 cm apart. Sonographic evaluation of the right axilla did not show any enlarged adenopathy. 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  Baseline MUGA scan showed EF of 58%. MRI showed two distinct masses in the right breast 1.5 cm and 1.2 cm 2 cm apart no additional suspicious masses or abnormal enhancement identified. No suspicious adenopathy  6 cycles of neoadjuvant TCHP chemotherapy completed on 02/29/2020.Final pathology showed residual tumor of 5 mm.Negative margins. 1 sentinel lymph node negative for malignancy. Overall cancer cellularity 1%. Percentage of cancer that is in situ 0%. [id number]  Patient  is started on adjuvant Kadcyla on 04/26/2020.  Treatment complicated by thrombocytopenia despite dose reduction   Interval history-patient reports feeling well today and other than mild fatigue she has no new complaints.  She does have baseline peripheral neuropathy in her hands and feet for which she is also going through acupuncture and is taking gabapentin.  ECOG PS- 1 Pain scale- 0  Review of systems- Review of Systems  Constitutional: Positive for malaise/fatigue. Negative for chills, fever and weight loss.  HENT: Negative for congestion, ear discharge and nosebleeds.   Eyes: Negative for blurred vision.  Respiratory: Negative for cough, hemoptysis, sputum production, shortness of breath and wheezing.   Cardiovascular: Negative for chest pain, palpitations, orthopnea and claudication.  Gastrointestinal: Negative for abdominal pain, blood in stool, constipation, diarrhea, heartburn, melena, nausea and vomiting.  Genitourinary: Negative for dysuria, flank pain, frequency, hematuria and urgency.  Musculoskeletal: Negative for back pain, joint pain and myalgias.  Skin: Negative for rash.  Neurological: Positive for sensory change (Peripheral neuropathy). Negative for dizziness, tingling, focal weakness, seizures, weakness and headaches.  Endo/Heme/Allergies: Does not bruise/bleed easily.  Psychiatric/Behavioral: Negative for depression and suicidal ideas. The patient does not have insomnia.      Allergies  Allergen Reactions  . Statins Rash    Other reaction(s): Muscle Pain Possible hives also     Past Medical History:  Diagnosis Date  . Adverse effect of anesthesia    hard to awaken  . Anemia   . Arthritis   . Asthma   . Asthma without status asthmaticus    unspecified; Take advair each night  . Breast cancer ([place])   . Bronchitis   . Constipation   . Diabetes ([place]) 2010  .  Diabetes mellitus type 2, uncomplicated ([place])   . Diabetes mellitus without complication ([place])   .  Encounter for blood transfusion    hysterectomy 9-10 years ago  . Heart murmur    unspecified  . Hyperlipidemia   . Hypertension 1991  . Hypothyroid   . Multiple thyroid nodules   . Neuromuscular disorder ([facility])    neuropathy  . Osteoarthritis of right hip 02/04/2017  . Palpitations   . Pernicious anemia   . Pneumonia   . Sleep apnea    Use C-PAP; can't use mouth piece. Anxiety  . Thyroid disease    nodules  . Thyroid nodule    x2  . Urinary urgency      Past Surgical History:  Procedure Laterality Date  . ABDOMINAL HYSTERECTOMY  2011   partial  . ABDOMINAL SURGERY    . CARPAL TUNNEL RELEASE Left 2008  . CARPAL TUNNEL RELEASE Right   . CHOLECYSTECTOMY  1982  . COLONOSCOPY WITH PROPOFOL N/A 10/05/2017   Procedure: COLONOSCOPY WITH PROPOFOL;  Surgeon: Manya Silvas, MD;  Location: [facility] ENDOSCOPY;  Service: Endoscopy;  Laterality: N/A;  . DIAGNOSTIC LAPAROSCOPY    . JOINT REPLACEMENT Right 02/09/2017   TOTAL HIP REPLACEMENT, DR. CARTER. [place]  . KNEE ARTHROSCOPY  1990s  . PARTIAL MASTECTOMY WITH NEEDLE LOCALIZATION AND AXILLARY SENTINEL LYMPH NODE BX Right 03/28/2020   Procedure: PARTIAL MASTECTOMY WITH NEEDLE LOCALIZATION AND AXILLARY SENTINEL LYMPH NODE BX;  Surgeon: Herbert Pun, MD;  Location: [facility] ORS;  Service: General;  Laterality: Right;  . PORTACATH PLACEMENT Left 11/07/2019   Procedure: INSERTION PORT-A-CATH;  Surgeon: Herbert Pun, MD;  Location: [facility] ORS;  Service: General;  Laterality: Left;  . TOTAL HIP ARTHROPLASTY Left 06/23/2017   Procedure: TOTAL HIP ARTHROPLASTY ANTERIOR APPROACH;  Surgeon: Hessie Knows, MD;  Location: [facility] ORS;  Service: Orthopedics;  Laterality: Left;    Social History   Socioeconomic History  . Marital status: Married    Spouse name: Not on file  . Number of children: 2  . Years of education: some college  . Highest education level: Not on file  Occupational History  . Occupation: retired    Comment:  [employer]  Tobacco Use  . Smoking status: Never Smoker  . Smokeless tobacco: Never Used  Vaping Use  . Vaping Use: Never used  Substance and Sexual Activity  . Alcohol use: No  . Drug use: No  . Sexual activity: Not Currently  Other Topics Concern  . Not on file  Social History Narrative  . Not on file   Social Determinants of Health   Financial Resource Strain:   . Difficulty of Paying Living Expenses:   Food Insecurity:   . Worried About Charity fundraiser in the Last Year:   . Arboriculturist in the Last Year:   Transportation Needs:   . Film/video editor (Medical):   Marland Kitchen Lack of Transportation (Non-Medical):   Physical Activity:   . Days of Exercise per Week:   . Minutes of Exercise per Session:   Stress:   . Feeling of Stress :   Social Connections:   . Frequency of Communication with Friends and Family:   . Frequency of Social Gatherings with Friends and Family:   . Attends Religious Services:   . Active Member of Clubs or Organizations:   . Attends Archivist Meetings:   Marland Kitchen Marital Status:   Intimate Partner Violence:   . Fear of Current  or Ex-Partner:   . Emotionally Abused:   Marland Kitchen Physically Abused:   . Sexually Abused:     Family History  Problem Relation Age of Onset  . Heart disease Mother   . Hypertension Mother   . Arrhythmia Mother   . Heart attack Mother   . Diabetes Mother   . Hypertension Father   . Parkinson's disease Father   . Coronary artery disease Maternal Uncle   . Heart attack Maternal Uncle   . Diabetes Sister   . Diabetes Brother   . Heart disease Brother   . Breast cancer Neg Hx      Current Outpatient Medications:  .  acetaminophen (TYLENOL) 500 MG tablet, Take 500 mg by mouth every 6 (six) hours as needed., Disp: , Rfl:  .  albuterol (PROVENTIL HFA;VENTOLIN HFA) 108 (90 Base) MCG/ACT inhaler, Inhale 2 puffs into the lungs every 6 (six) hours as needed for wheezing or shortness of breath., Disp:  , Rfl:  .  carvedilol (COREG) 25 MG tablet, Take 25 mg by mouth 2 (two) times daily with a meal., Disp: , Rfl:  .  cholecalciferol (VITAMIN D) 1000 units tablet, Take 1,000 Units by mouth daily., Disp: , Rfl:  .  diphenoxylate-atropine (LOMOTIL) 2.5-0.025 MG tablet, Take 1 tablet by mouth 4 (four) times daily as needed for diarrhea or loose stools., Disp: 60 tablet, Rfl: 0 .  Fluticasone-Salmeterol (ADVAIR) 250-50 MCG/DOSE AEPB, Inhale 1 puff into the lungs 2 (two) times daily. , Disp: , Rfl:  .  gabapentin (NEURONTIN) 300 MG capsule, Take 1 capsule (300 mg total) by mouth as directed. Take 1 tablet at night for 3 days and then 1 tablet in am and at night for 3 days and then 1 tablet three times a day for rest of month, Disp: 59 capsule, Rfl: 0 .  lidocaine-prilocaine (EMLA) cream, Apply 1 application topically as needed., Disp: 30 g, Rfl: 0 .  loperamide (IMODIUM) 2 MG capsule, Take 2 mg by mouth as needed for diarrhea or loose stools., Disp: , Rfl:  .  loratadine (CLARITIN) 10 MG tablet, Take 10 mg by mouth daily as needed for allergies., Disp: , Rfl:  .  metFORMIN (GLUCOPHAGE-XR) 500 MG 24 hr tablet, Take 500 mg by mouth daily with supper. , Disp: , Rfl:  .  pantoprazole (PROTONIX) 20 MG tablet, Take 1 tablet (20 mg total) by mouth 2 (two) times daily before a meal., Disp: 60 tablet, Rfl: 2 .  potassium chloride (KLOR-CON) 20 MEQ packet, Take 20 mEq by mouth daily., Disp: 90 packet, Rfl: 0 .  solifenacin (VESICARE) 5 MG tablet, Take 10 mg by mouth every evening. , Disp: , Rfl:  .  telmisartan-hydrochlorothiazide (MICARDIS HCT) 80-25 MG tablet, Take 1 tablet by mouth every morning. , Disp: , Rfl:  .  traMADol (ULTRAM) 50 MG tablet, Take 1 tablet (50 mg total) by mouth every 6 (six) hours as needed., Disp: 30 tablet, Rfl: 0 .  triamcinolone cream (KENALOG) 0.5 %, Apply 1 application topically as needed. , Disp: , Rfl:  No current facility-administered medications for this  visit.  Facility-Administered Medications Ordered in Other Visits:  .  sodium chloride flush (NS) 0.9 % injection 10 mL, 10 mL, Intravenous, PRN, Sindy Guadeloupe, MD, 10 mL at 01/05/20 1344 .  sodium chloride flush (NS) 0.9 % injection 10 mL, 10 mL, Intravenous, PRN, Sindy Guadeloupe, MD, 10 mL at 05/17/20 0858  Physical exam:  Vitals:   06/19/20 0850  BP: Marland Kitchen)  152/65  Pulse: 73  Temp: (!) 97.4 F (36.3 C)  TempSrc: Tympanic  SpO2: 98%  Weight: 183 lb 14.4 oz (83.4 kg)   Physical Exam Constitutional:      General: She is not in acute distress. Pulmonary:     Effort: Pulmonary effort is normal.  Skin:    General: Skin is warm and dry.  Neurological:     Mental Status: She is alert and oriented to person, place, and time.   Radiation changes noted over right breast.  CMP Latest Ref Rng & Units 06/19/2020  Glucose 70 - 99 mg/dL 102(H)  BUN 8 - 23 mg/dL 30(H)  Creatinine 0.44 - 1.00 mg/dL 1.43(H)  Sodium 135 - 145 mmol/L 142  Potassium 3.5 - 5.1 mmol/L 3.6  Chloride 98 - 111 mmol/L 101  CO2 22 - 32 mmol/L 33(H)  Calcium 8.9 - 10.3 mg/dL 9.5  Total Protein 6.5 - 8.1 g/dL 6.9  Total Bilirubin 0.3 - 1.2 mg/dL 0.6  Alkaline Phos 38 - 126 U/L 96  AST 15 - 41 U/L 24  ALT 0 - 44 U/L 16   CBC Latest Ref Rng & Units 06/19/2020  WBC 4.0 - 10.5 K/uL 3.5(L)  Hemoglobin 12.0 - 15.0 g/dL 9.9(L)  Hematocrit 36 - 46 % 31.5(L)  Platelets 150 - 400 K/uL 79(L)     Assessment and plan- Patient is a 64 y.o. female withinvasive mammary carcinoma of the right breast clinical prognostic stage IamcT1 ccN0 cM0 ER/PR positive HER-2/neu positive.She is s/p 6 cycles of neoadjuvant TCHP chemotherapy with residual 5 mm tumor.  She is here for on treatment assessment prior to cycle 3 of adjuvant Kadcyla  I had reduce the dose of Kadcyla to 3 mg/kg 3 weeks ago.  Platelets are still reduced at 79 today.  I will hold off on giving her Kadcyla today.  Patient also has mild leukopenia/neutropenia.  This  is all likely secondary to [facility] and she is experiencing treatment interruptions because of cytopenias.  I am therefore inclined to switch her back from Kadcyla to Herceptin to finish her 1 year of adjuvant treatment without interruptions.  She will return to clinic on 06/28/2020 and see covering NP with CBC with differential, CMP to receive Herceptin.  I will see her 4 weeks from now with CBC with differential, CMP for the next cycle of Herceptin and I will consider adding Perjeta at that time.  Patient has had significant diarrhea with Herceptin and Perjeta and I will therefore start off with Herceptin single agent first and depending on tolerance consider adding Perjeta  Chemo-induced peripheral neuropathy: Continue gabapentin and she is going through acupuncture  Anemia: Likely secondary to prior chemotherapy as well as Kadcyla.  Continue to monitor  Patient completes adjuvant radiation treatment tomorrow.  Her tumor is ER/PR positive and therefore she would benefit from adjuvant hormone therapy for at least 5 if not 10 years.  Discussed risks and benefits of letrozole including all but not limited to mood swings, fatigue, hot flashes, arthralgias and worsening bone health.  She will need to take calcium and vitamin D along with letrozole.  I will also schedule a bone density scan at this time.   Visit Diagnosis 1. High risk medication use   2. Invasive carcinoma of breast ([place])   3. Drug-induced thrombocytopenia   4. Postmenopausal   5. Chemotherapy-induced peripheral neuropathy ([place])      Dr. Randa Evens, MD, MPH [facility] at [facility] [phone] 06/19/2020 12:42 PM

## 2020-06-19 NOTE — Progress Notes (Signed)
Still having numbness and tingling in left hand more than right and bil. Top of feet and toes bil.

## 2020-06-20 ENCOUNTER — Ambulatory Visit
Admission: RE | Admit: 2020-06-20 | Discharge: 2020-06-20 | Disposition: A | Discharge status: Home / Self Care | Payer: Medicare Other | Source: Ambulatory Visit | Attending: Radiation Oncology | Admitting: Radiation Oncology

## 2020-06-20 DIAGNOSIS — Z510 Encounter for antineoplastic radiation therapy: Secondary | ICD-10-CM | POA: Diagnosis not present

## 2020-06-21 ENCOUNTER — Inpatient Hospital Stay: Payer: Medicare Other | Admitting: Oncology

## 2020-06-21 ENCOUNTER — Inpatient Hospital Stay: Payer: Medicare Other

## 2020-06-21 NOTE — Progress Notes (Signed)
Pharmacist Chemotherapy Monitoring - Initial Assessment    Anticipated start date: 06/28/20   Regimen:  . Are orders appropriate based on the patient's diagnosis, regimen, and cycle? Yes . Does the plan date match the patient's scheduled date? Yes . Is the sequencing of drugs appropriate? Yes . Are the premedications appropriate for the patient's regimen? Yes . Prior Authorization for treatment is: Pending o If applicable, is the correct biosimilar selected based on the patient's insurance? not applicable  Organ Function and Labs: Marland Kitchen Are dose adjustments needed based on the patient's renal function, hepatic function, or hematologic function? No . Are appropriate labs ordered prior to the start of patient's treatment? Yes . Other organ system assessment, if indicated: trastuzumab: Echo/ MUGA . The following baseline labs, if indicated, have been ordered: N/A  Dose Assessment: . Are the drug doses appropriate? Yes . Are the following correct: o Drug concentrations Yes o IV fluid compatible with drug Yes o Administration routes Yes o Timing of therapy Yes . If applicable, does the patient have documented access for treatment and/or plans for port-a-cath placement? not applicable . If applicable, have lifetime cumulative doses been properly documented and assessed? not applicable Lifetime Dose Tracking  . Carboplatin: 2,810 mg = 0.01 % of the maximum lifetime dose of 999,999,999 mg  o   Toxicity Monitoring/Prevention: . The patient has the following take home antiemetics prescribed: N/A . The patient has the following take home medications prescribed: N/A . Medication allergies and previous infusion related reactions, if applicable, have been reviewed and addressed. Yes . The patient's current medication list has been assessed for drug-drug interactions with their chemotherapy regimen. no significant drug-drug interactions were identified on review.  Order Review: . Are the treatment  plan orders signed? Yes . Is the patient scheduled to see a provider prior to their treatment? Yes  I verify that I have reviewed each item in the above checklist and answered each question accordingly.  Cono Gebhard K 06/21/2020 8:25 AM

## 2020-06-28 ENCOUNTER — Other Ambulatory Visit: Payer: Self-pay

## 2020-06-28 ENCOUNTER — Encounter: Payer: Self-pay | Admitting: Oncology

## 2020-06-28 ENCOUNTER — Inpatient Hospital Stay: Payer: Medicare Other | Attending: Oncology | Admitting: Oncology

## 2020-06-28 ENCOUNTER — Telehealth: Payer: Self-pay | Admitting: *Deleted

## 2020-06-28 ENCOUNTER — Inpatient Hospital Stay: Payer: Medicare Other

## 2020-06-28 VITALS — BP 159/73 | HR 65 | Temp 96.1°F | Resp 18 | Wt 183.5 lb

## 2020-06-28 DIAGNOSIS — Z79899 Other long term (current) drug therapy: Secondary | ICD-10-CM

## 2020-06-28 DIAGNOSIS — C50919 Malignant neoplasm of unspecified site of unspecified female breast: Secondary | ICD-10-CM

## 2020-06-28 DIAGNOSIS — Z5112 Encounter for antineoplastic immunotherapy: Secondary | ICD-10-CM | POA: Insufficient documentation

## 2020-06-28 DIAGNOSIS — C50911 Malignant neoplasm of unspecified site of right female breast: Secondary | ICD-10-CM | POA: Insufficient documentation

## 2020-06-28 DIAGNOSIS — Z95828 Presence of other vascular implants and grafts: Secondary | ICD-10-CM

## 2020-06-28 LAB — CBC WITH DIFFERENTIAL/PLATELET
Abs Immature Granulocytes: 0 10*3/uL (ref 0.00–0.07)
Basophils Absolute: 0.1 10*3/uL (ref 0.0–0.1)
Basophils Relative: 1 %
Eosinophils Absolute: 0.1 10*3/uL (ref 0.0–0.5)
Eosinophils Relative: 2 %
HCT: 32.2 % — ABNORMAL LOW (ref 36.0–46.0)
Hemoglobin: 9.8 g/dL — ABNORMAL LOW (ref 12.0–15.0)
Immature Granulocytes: 0 %
Lymphocytes Relative: 36 %
Lymphs Abs: 1.3 10*3/uL (ref 0.7–4.0)
MCH: 26.8 pg (ref 26.0–34.0)
MCHC: 30.4 g/dL (ref 30.0–36.0)
MCV: 88.2 fL (ref 80.0–100.0)
Monocytes Absolute: 0.4 10*3/uL (ref 0.1–1.0)
Monocytes Relative: 11 %
Neutro Abs: 1.8 10*3/uL (ref 1.7–7.7)
Neutrophils Relative %: 50 %
Platelets: 91 10*3/uL — ABNORMAL LOW (ref 150–400)
RBC: 3.65 MIL/uL — ABNORMAL LOW (ref 3.87–5.11)
RDW: 15.5 % (ref 11.5–15.5)
WBC: 3.6 10*3/uL — ABNORMAL LOW (ref 4.0–10.5)
nRBC: 0 % (ref 0.0–0.2)

## 2020-06-28 LAB — COMPREHENSIVE METABOLIC PANEL
ALT: 17 U/L (ref 0–44)
AST: 29 U/L (ref 15–41)
Albumin: 3.7 g/dL (ref 3.5–5.0)
Alkaline Phosphatase: 98 U/L (ref 38–126)
Anion gap: 9 (ref 5–15)
BUN: 26 mg/dL — ABNORMAL HIGH (ref 8–23)
CO2: 31 mmol/L (ref 22–32)
Calcium: 9.2 mg/dL (ref 8.9–10.3)
Chloride: 101 mmol/L (ref 98–111)
Creatinine, Ser: 1.3 mg/dL — ABNORMAL HIGH (ref 0.44–1.00)
GFR calc Af Amer: 50 mL/min — ABNORMAL LOW (ref >60)
GFR calc non Af Amer: 43 mL/min — ABNORMAL LOW (ref >60)
Glucose, Bld: 142 mg/dL — ABNORMAL HIGH (ref 70–99)
Potassium: 3.4 mmol/L — ABNORMAL LOW (ref 3.5–5.1)
Sodium: 141 mmol/L (ref 135–145)
Total Bilirubin: 0.4 mg/dL (ref 0.3–1.2)
Total Protein: 6.9 g/dL (ref 6.5–8.1)

## 2020-06-28 MED ORDER — ACETAMINOPHEN 325 MG PO TABS
650.0000 mg | ORAL_TABLET | Freq: Once | ORAL | Status: AC
  Administered 2020-06-28: 650 mg via ORAL
  Filled 2020-06-28: qty 2

## 2020-06-28 MED ORDER — TRASTUZUMAB-DKST CHEMO 150 MG IV SOLR
8.0000 mg/kg | Freq: Once | INTRAVENOUS | Status: AC
  Administered 2020-06-28: 672 mg via INTRAVENOUS
  Filled 2020-06-28: qty 32

## 2020-06-28 MED ORDER — HEPARIN SOD (PORK) LOCK FLUSH 100 UNIT/ML IV SOLN
500.0000 [IU] | Freq: Once | INTRAVENOUS | Status: DC
  Filled 2020-06-28: qty 5

## 2020-06-28 MED ORDER — SODIUM CHLORIDE 0.9% FLUSH
10.0000 mL | INTRAVENOUS | Status: DC | PRN
  Administered 2020-06-28: 10 mL via INTRAVENOUS
  Filled 2020-06-28: qty 10

## 2020-06-28 MED ORDER — DIPHENHYDRAMINE HCL 25 MG PO CAPS
50.0000 mg | ORAL_CAPSULE | Freq: Once | ORAL | Status: DC
  Filled 2020-06-28: qty 2

## 2020-06-28 MED ORDER — NYSTATIN 100000 UNIT/GM EX POWD
1.0000 | Freq: Three times a day (TID) | CUTANEOUS | 0 refills | Status: DC
Start: 2020-06-28 — End: 2020-08-09

## 2020-06-28 MED ORDER — DIPHENHYDRAMINE HCL 25 MG PO CAPS
25.0000 mg | ORAL_CAPSULE | Freq: Once | ORAL | Status: AC
  Administered 2020-06-28: 25 mg via ORAL

## 2020-06-28 MED ORDER — SODIUM CHLORIDE 0.9 % IV SOLN
Freq: Once | INTRAVENOUS | Status: AC
  Filled 2020-06-28: qty 250

## 2020-06-28 MED ORDER — HEPARIN SOD (PORK) LOCK FLUSH 100 UNIT/ML IV SOLN
500.0000 [IU] | Freq: Once | INTRAVENOUS | Status: AC | PRN
  Administered 2020-06-28: 500 [IU]
  Filled 2020-06-28: qty 5

## 2020-06-28 NOTE — Progress Notes (Signed)
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 It showed 2 suspicious lesions in the right breast. Diagnostic mammogram and ultrasound confirmed 2 masses in the 9 o'clock position of the right breast measuring 1.1 x 1.2 x 1.2 cm and the other one measuring 0.8 x 0.8 x 0.9 cm. These 2 masses were 1.7 cm apart. Sonographic evaluation of the right axilla did not show any enlarged adenopathy. 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  Baseline MUGA scan showed EF of 58%. MRI showed two distinct masses in the right breast 1.5 cm and 1.2 cm 2 cm apart no additional suspicious masses or abnormal enhancement identified. No suspicious adenopathy  6 cycles of neoadjuvant TCHP chemotherapy completed on 02/29/2020.Final pathology showed residual tumor of 5 mm.Negative margins. 1 sentinel lymph node negative for malignancy. Overall cancer cellularity 1%. Percentage of cancer that is in situ 0%. [id number]  Patient  is started on adjuvant Kadcyla on 04/26/2020.  Treatment complicated by thrombocytopenia despite dose reduction   Interval history-patient reports feeling well with mild fatigue and right breast skin changes since completing radiation.  Using Aquaphor.  Continues to have baseline peripheral neuropathy in her hands and feet.  She continues acupuncture and gabapentin.  Had a root canal yesterday and will start amoxicillin for 28 days today.   ECOG PS- 1 Pain scale- 0  Review of systems- Review of Systems  Constitutional: Positive for malaise/fatigue. Negative for chills, fever and weight loss.  HENT: Negative for congestion, ear discharge and nosebleeds.   Eyes: Negative for blurred vision.  Respiratory: Negative for cough, hemoptysis, sputum production, shortness of breath and wheezing.   Cardiovascular: Negative for chest pain, palpitations, orthopnea and claudication.  Gastrointestinal: Negative for abdominal pain, blood in stool, constipation, diarrhea, heartburn, melena, nausea and vomiting.  Genitourinary: Negative for dysuria, flank pain, frequency, hematuria and urgency.  Musculoskeletal: Negative for back pain, joint pain and myalgias.  Skin: Positive for rash.  Neurological: Positive for sensory change (Peripheral neuropathy). Negative for dizziness, tingling, focal weakness, seizures, weakness and headaches.  Endo/Heme/Allergies: Does not bruise/bleed easily.  Psychiatric/Behavioral: Negative for depression and suicidal ideas. The patient does not have insomnia.      Allergies  Allergen Reactions  . Statins Rash    Other reaction(s): Muscle Pain Possible hives also     Past Medical History:  Diagnosis Date  . Adverse effect of anesthesia    hard to awaken  . Anemia   . Arthritis   . Asthma   . Asthma without status asthmaticus    unspecified; Take advair each night  . Breast cancer ([place])   .  Bronchitis   . Constipation   . Diabetes ([place]) 2010  . Diabetes mellitus  type 2, uncomplicated ([place])   . Diabetes mellitus without complication ([place])   . Encounter for blood transfusion    hysterectomy 9-10 years ago  . Heart murmur    unspecified  . Hyperlipidemia   . Hypertension 1991  . Hypothyroid   . Multiple thyroid nodules   . Neuromuscular disorder ([facility])    neuropathy  . Osteoarthritis of right hip 02/04/2017  . Palpitations   . Pernicious anemia   . Pneumonia   . Sleep apnea    Use C-PAP; can't use mouth piece. Anxiety  . Thyroid disease    nodules  . Thyroid nodule    x2  . Urinary urgency      Past Surgical History:  Procedure Laterality Date  . ABDOMINAL HYSTERECTOMY  2011   partial  . ABDOMINAL SURGERY    . CARPAL TUNNEL RELEASE Left 2008  . CARPAL TUNNEL RELEASE Right   . CHOLECYSTECTOMY  1982  . COLONOSCOPY WITH PROPOFOL N/A 10/05/2017   Procedure: COLONOSCOPY WITH PROPOFOL;  Surgeon: Manya Silvas, MD;  Location: [facility] ENDOSCOPY;  Service: Endoscopy;  Laterality: N/A;  . DIAGNOSTIC LAPAROSCOPY    . JOINT REPLACEMENT Right 02/09/2017   TOTAL HIP REPLACEMENT, DR. CARTER. [place]  . KNEE ARTHROSCOPY  1990s  . PARTIAL MASTECTOMY WITH NEEDLE LOCALIZATION AND AXILLARY SENTINEL LYMPH NODE BX Right 03/28/2020   Procedure: PARTIAL MASTECTOMY WITH NEEDLE LOCALIZATION AND AXILLARY SENTINEL LYMPH NODE BX;  Surgeon: Herbert Pun, MD;  Location: [facility] ORS;  Service: General;  Laterality: Right;  . PORTACATH PLACEMENT Left 11/07/2019   Procedure: INSERTION PORT-A-CATH;  Surgeon: Herbert Pun, MD;  Location: [facility] ORS;  Service: General;  Laterality: Left;  . TOTAL HIP ARTHROPLASTY Left 06/23/2017   Procedure: TOTAL HIP ARTHROPLASTY ANTERIOR APPROACH;  Surgeon: Hessie Knows, MD;  Location: [facility] ORS;  Service: Orthopedics;  Laterality: Left;    Social History   Socioeconomic History  . Marital status: Married    Spouse name: Not on file  . Number of children: 2  . Years of education: some college  . Highest education  level: Not on file  Occupational History  . Occupation: retired    Comment: [employer]  Tobacco Use  . Smoking status: Never Smoker  . Smokeless tobacco: Never Used  Vaping Use  . Vaping Use: Never used  Substance and Sexual Activity  . Alcohol use: No  . Drug use: No  . Sexual activity: Not Currently  Other Topics Concern  . Not on file  Social History Narrative  . Not on file   Social Determinants of Health   Financial Resource Strain:   . Difficulty of Paying Living Expenses:   Food Insecurity:   . Worried About Charity fundraiser in the Last Year:   . Arboriculturist in the Last Year:   Transportation Needs:   . Film/video editor (Medical):   Marland Kitchen Lack of Transportation (Non-Medical):   Physical Activity:   . Days of Exercise per Week:   . Minutes of Exercise per Session:   Stress:   . Feeling of Stress :   Social Connections:   . Frequency of Communication with Friends and Family:   . Frequency of Social Gatherings with Friends and Family:   . Attends Religious Services:   . Active Member of Clubs or Organizations:   . Attends Archivist Meetings:   .  Marital Status:   Intimate Partner Violence:   . Fear of Current or Ex-Partner:   . Emotionally Abused:   Marland Kitchen Physically Abused:   . Sexually Abused:     Family History  Problem Relation Age of Onset  . Heart disease Mother   . Hypertension Mother   . Arrhythmia Mother   . Heart attack Mother   . Diabetes Mother   . Hypertension Father   . Parkinson's disease Father   . Coronary artery disease Maternal Uncle   . Heart attack Maternal Uncle   . Diabetes Sister   . Diabetes Brother   . Heart disease Brother   . Breast cancer Neg Hx      Current Outpatient Medications:  .  acetaminophen (TYLENOL) 500 MG tablet, Take 500 mg by mouth every 6 (six) hours as needed., Disp: , Rfl:  .  carvedilol (COREG) 25 MG tablet, Take 25 mg by mouth 2 (two) times daily with a meal., Disp: ,  Rfl:  .  cholecalciferol (VITAMIN D) 1000 units tablet, Take 1,000 Units by mouth daily., Disp: , Rfl:  .  Fluticasone-Salmeterol (ADVAIR) 250-50 MCG/DOSE AEPB, Inhale 1 puff into the lungs 2 (two) times daily. , Disp: , Rfl:  .  gabapentin (NEURONTIN) 300 MG capsule, Take 1 capsule (300 mg total) by mouth as directed. Take 1 tablet at night for 3 days and then 1 tablet in am and at night for 3 days and then 1 tablet three times a day for rest of month, Disp: 59 capsule, Rfl: 0 .  lidocaine-prilocaine (EMLA) cream, Apply 1 application topically as needed., Disp: 30 g, Rfl: 0 .  loperamide (IMODIUM) 2 MG capsule, Take 2 mg by mouth as needed for diarrhea or loose stools., Disp: , Rfl:  .  metFORMIN (GLUCOPHAGE-XR) 500 MG 24 hr tablet, Take 500 mg by mouth daily with supper. , Disp: , Rfl:  .  pantoprazole (PROTONIX) 20 MG tablet, Take 1 tablet (20 mg total) by mouth 2 (two) times daily before a meal., Disp: 60 tablet, Rfl: 2 .  potassium chloride (KLOR-CON) 20 MEQ packet, Take 20 mEq by mouth daily., Disp: 90 packet, Rfl: 0 .  solifenacin (VESICARE) 5 MG tablet, Take 10 mg by mouth every evening. , Disp: , Rfl:  .  telmisartan-hydrochlorothiazide (MICARDIS HCT) 80-25 MG tablet, Take 1 tablet by mouth every morning. , Disp: , Rfl:  .  traMADol (ULTRAM) 50 MG tablet, Take 1 tablet (50 mg total) by mouth every 6 (six) hours as needed., Disp: 30 tablet, Rfl: 0 .  triamcinolone cream (KENALOG) 0.5 %, Apply 1 application topically as needed. , Disp: , Rfl:  .  albuterol (PROVENTIL HFA;VENTOLIN HFA) 108 (90 Base) MCG/ACT inhaler, Inhale 2 puffs into the lungs every 6 (six) hours as needed for wheezing or shortness of breath. (Patient not taking: Reported on 06/28/2020), Disp: , Rfl:  .  diphenoxylate-atropine (LOMOTIL) 2.5-0.025 MG tablet, Take 1 tablet by mouth 4 (four) times daily as needed for diarrhea or loose stools. (Patient not taking: Reported on 06/28/2020), Disp: 60 tablet, Rfl: 0 .  letrozole (FEMARA)  2.5 MG tablet, Take 1 tablet (2.5 mg total) by mouth daily. (Patient not taking: Reported on 06/28/2020), Disp: 30 tablet, Rfl: 3 .  loratadine (CLARITIN) 10 MG tablet, Take 10 mg by mouth daily as needed for allergies. (Patient not taking: Reported on 06/28/2020), Disp: , Rfl:  .  nystatin (MYCOSTATIN/NYSTOP) powder, Apply 1 application topically 3 (three) times daily., Disp: 45 g,  Rfl: 0 No current facility-administered medications for this visit.  Facility-Administered Medications Ordered in Other Visits:  .  heparin lock flush 100 unit/mL, 500 Units, Intravenous, Once, Chalyn Amescua E, NP .  sodium chloride flush (NS) 0.9 % injection 10 mL, 10 mL, Intravenous, PRN, Sindy Guadeloupe, MD, 10 mL at 01/05/20 1344 .  sodium chloride flush (NS) 0.9 % injection 10 mL, 10 mL, Intravenous, PRN, Sindy Guadeloupe, MD, 10 mL at 05/17/20 0858 .  sodium chloride flush (NS) 0.9 % injection 10 mL, 10 mL, Intravenous, PRN, Jacquelin Hawking, NP, 10 mL at 06/28/20 1032  Physical exam:  Vitals:   06/28/20 1019  BP: (!) 159/73  Pulse: 65  Resp: 18  Temp: (!) 96.1 F (35.6 C)  TempSrc: Tympanic  Weight: 183 lb 8 oz (83.2 kg)   Physical Exam Constitutional:      General: She is not in acute distress. Pulmonary:     Effort: Pulmonary effort is normal.  Skin:    General: Skin is warm and dry.  Neurological:     Mental Status: She is alert and oriented to person, place, and time.   Radiation changes noted over right breast. Possible moisture associated dermatitis verse yeast infection under right breast fold  CMP Latest Ref Rng & Units 06/28/2020  Glucose 70 - 99 mg/dL 142(H)  BUN 8 - 23 mg/dL 26(H)  Creatinine 0.44 - 1.00 mg/dL 1.30(H)  Sodium 135 - 145 mmol/L 141  Potassium 3.5 - 5.1 mmol/L 3.4(L)  Chloride 98 - 111 mmol/L 101  CO2 22 - 32 mmol/L 31  Calcium 8.9 - 10.3 mg/dL 9.2  Total Protein 6.5 - 8.1 g/dL 6.9  Total Bilirubin 0.3 - 1.2 mg/dL 0.4  Alkaline Phos 38 - 126 U/L 98  AST 15 - 41 U/L  29  ALT 0 - 44 U/L 17   CBC Latest Ref Rng & Units 06/28/2020  WBC 4.0 - 10.5 K/uL 3.6(L)  Hemoglobin 12.0 - 15.0 g/dL 9.8(L)  Hematocrit 36 - 46 % 32.2(L)  Platelets 150 - 400 K/uL 91(L)     Assessment and plan- Patient is a 64 y.o. female withinvasive mammary carcinoma of the right breast clinical prognostic stage IamcT1 ccN0 cM0 ER/PR positive HER-2/neu positive.She is s/p 6 cycles of neoadjuvant TCHP chemotherapy with residual 5 mm tumor.  She is here for on treatment assessment prior to reinitiate Herceptin.  Kadcyla discontinued secondary to cytopenias.  Proceed with Herceptin only today.  Per Dr. Janese Banks, may add Perjeta if counts are stable and she is able to tolerate.   She will return to clinic in 2 weeks with lab work and assessment with Dr. Janese Banks prior to cycle 2 Herceptin +/-Perjeta based on tolerance.  Chemo-induced peripheral neuropathy: Continue gabapentin and she is going through acupuncture  Has DEXA scan ordered for 07/09/2020. Has not started Femara yet.  Will start after her vacation next week.  Moisture associated dermatitis/yeast infection under her right breast-recommend she keep her breast clean and dry. Recommend nystatin powder several times daily. Can continue Aquaphor prn to other skin concerns.   Root canal- continue antibiotics per dentist.   Visit Diagnosis 1. Invasive carcinoma of breast ([facility])    Dr. Randa Evens, MD, MPH [facility] [phone] 06/28/2020 11:17 AM

## 2020-06-28 NOTE — Progress Notes (Signed)
Potassium 3.4. Pt admits to missing a dose. Instructed her to take 20 milliequivalents twice a day today and tomorrow then return to 20 milliequivalents daily.

## 2020-06-28 NOTE — Progress Notes (Signed)
Here for Herceptin tx today. No new area of concern. Bowels more regular. Continues to have neuropathy.

## 2020-06-28 NOTE — Telephone Encounter (Signed)
Error in charting.

## 2020-06-28 NOTE — Progress Notes (Signed)
Pt tolerated infusion well. No s/s of distress or reaction noted. Pt stable at discharge.  

## 2020-06-30 ENCOUNTER — Other Ambulatory Visit: Payer: Self-pay | Admitting: Oncology

## 2020-07-02 ENCOUNTER — Other Ambulatory Visit: Payer: Self-pay | Admitting: *Deleted

## 2020-07-02 ENCOUNTER — Telehealth: Payer: Self-pay | Admitting: *Deleted

## 2020-07-02 ENCOUNTER — Other Ambulatory Visit: Payer: Medicare Other

## 2020-07-02 NOTE — Telephone Encounter (Signed)
Pt calling from vacation and needs refill of gabapentin . She left the number for [facility] in [place]. I sent it in by telephone. Called pt and left her message that I called in refill to the pharmacy that she wanted me to do.

## 2020-07-03 ENCOUNTER — Ambulatory Visit: Payer: Medicare Other

## 2020-07-09 ENCOUNTER — Ambulatory Visit
Admission: RE | Admit: 2020-07-09 | Discharge: 2020-07-09 | Disposition: A | Discharge status: Home / Self Care | Payer: Medicare Other | Source: Ambulatory Visit | Attending: Oncology | Admitting: Oncology

## 2020-07-09 ENCOUNTER — Other Ambulatory Visit: Payer: Self-pay

## 2020-07-09 DIAGNOSIS — C50919 Malignant neoplasm of unspecified site of unspecified female breast: Secondary | ICD-10-CM | POA: Diagnosis present

## 2020-07-09 DIAGNOSIS — Z79899 Other long term (current) drug therapy: Secondary | ICD-10-CM | POA: Insufficient documentation

## 2020-07-09 DIAGNOSIS — R29898 Other symptoms and signs involving the musculoskeletal system: Secondary | ICD-10-CM | POA: Diagnosis not present

## 2020-07-09 DIAGNOSIS — Z780 Asymptomatic menopausal state: Secondary | ICD-10-CM | POA: Insufficient documentation

## 2020-07-10 ENCOUNTER — Other Ambulatory Visit: Payer: Self-pay

## 2020-07-10 ENCOUNTER — Ambulatory Visit
Admission: RE | Admit: 2020-07-10 | Discharge: 2020-07-10 | Disposition: A | Discharge status: Home / Self Care | Payer: Medicare Other | Source: Ambulatory Visit | Attending: Internal Medicine | Admitting: Internal Medicine

## 2020-07-10 DIAGNOSIS — R29898 Other symptoms and signs involving the musculoskeletal system: Secondary | ICD-10-CM

## 2020-07-10 MED ORDER — GADOBUTROL 1 MMOL/ML IV SOLN
8.0000 mL | Freq: Once | INTRAVENOUS | Status: AC | PRN
  Administered 2020-07-10: 8 mL via INTRAVENOUS

## 2020-07-12 ENCOUNTER — Ambulatory Visit: Payer: Medicare Other | Admitting: Oncology

## 2020-07-12 ENCOUNTER — Ambulatory Visit: Payer: Medicare Other

## 2020-07-12 ENCOUNTER — Other Ambulatory Visit: Payer: Medicare Other

## 2020-07-18 ENCOUNTER — Other Ambulatory Visit: Payer: Self-pay

## 2020-07-18 DIAGNOSIS — Z79899 Other long term (current) drug therapy: Secondary | ICD-10-CM

## 2020-07-18 DIAGNOSIS — C50919 Malignant neoplasm of unspecified site of unspecified female breast: Secondary | ICD-10-CM

## 2020-07-19 ENCOUNTER — Inpatient Hospital Stay (HOSPITAL_BASED_OUTPATIENT_CLINIC_OR_DEPARTMENT_OTHER): Payer: Medicare Other | Admitting: Oncology

## 2020-07-19 ENCOUNTER — Other Ambulatory Visit: Payer: Self-pay

## 2020-07-19 ENCOUNTER — Inpatient Hospital Stay: Payer: Medicare Other

## 2020-07-19 ENCOUNTER — Encounter: Payer: Self-pay | Admitting: Oncology

## 2020-07-19 VITALS — BP 140/80 | HR 56 | Temp 95.0°F | Resp 18 | Wt 188.4 lb

## 2020-07-19 DIAGNOSIS — D61811 Other drug-induced pancytopenia: Secondary | ICD-10-CM | POA: Diagnosis not present

## 2020-07-19 DIAGNOSIS — Z79899 Other long term (current) drug therapy: Secondary | ICD-10-CM

## 2020-07-19 DIAGNOSIS — Z5112 Encounter for antineoplastic immunotherapy: Secondary | ICD-10-CM | POA: Diagnosis not present

## 2020-07-19 DIAGNOSIS — C50919 Malignant neoplasm of unspecified site of unspecified female breast: Secondary | ICD-10-CM

## 2020-07-19 LAB — COMPREHENSIVE METABOLIC PANEL
ALT: 17 U/L (ref 0–44)
AST: 25 U/L (ref 15–41)
Albumin: 3.8 g/dL (ref 3.5–5.0)
Alkaline Phosphatase: 99 U/L (ref 38–126)
Anion gap: 9 (ref 5–15)
BUN: 30 mg/dL — ABNORMAL HIGH (ref 8–23)
CO2: 31 mmol/L (ref 22–32)
Calcium: 9.2 mg/dL (ref 8.9–10.3)
Chloride: 99 mmol/L (ref 98–111)
Creatinine, Ser: 1.45 mg/dL — ABNORMAL HIGH (ref 0.44–1.00)
GFR calc Af Amer: 44 mL/min — ABNORMAL LOW (ref >60)
GFR calc non Af Amer: 38 mL/min — ABNORMAL LOW (ref >60)
Glucose, Bld: 95 mg/dL (ref 70–99)
Potassium: 3.7 mmol/L (ref 3.5–5.1)
Sodium: 139 mmol/L (ref 135–145)
Total Bilirubin: 0.4 mg/dL (ref 0.3–1.2)
Total Protein: 6.8 g/dL (ref 6.5–8.1)

## 2020-07-19 LAB — CBC WITH DIFFERENTIAL/PLATELET
Abs Immature Granulocytes: 0.01 10*3/uL (ref 0.00–0.07)
Basophils Absolute: 0.1 10*3/uL (ref 0.0–0.1)
Basophils Relative: 2 %
Eosinophils Absolute: 0.1 10*3/uL (ref 0.0–0.5)
Eosinophils Relative: 3 %
HCT: 31.5 % — ABNORMAL LOW (ref 36.0–46.0)
Hemoglobin: 9.6 g/dL — ABNORMAL LOW (ref 12.0–15.0)
Immature Granulocytes: 0 %
Lymphocytes Relative: 40 %
Lymphs Abs: 1.4 10*3/uL (ref 0.7–4.0)
MCH: 26.2 pg (ref 26.0–34.0)
MCHC: 30.5 g/dL (ref 30.0–36.0)
MCV: 86.1 fL (ref 80.0–100.0)
Monocytes Absolute: 0.4 10*3/uL (ref 0.1–1.0)
Monocytes Relative: 13 %
Neutro Abs: 1.5 10*3/uL — ABNORMAL LOW (ref 1.7–7.7)
Neutrophils Relative %: 42 %
Platelets: 102 10*3/uL — ABNORMAL LOW (ref 150–400)
RBC: 3.66 MIL/uL — ABNORMAL LOW (ref 3.87–5.11)
RDW: 15.5 % (ref 11.5–15.5)
WBC: 3.4 10*3/uL — ABNORMAL LOW (ref 4.0–10.5)
nRBC: 0 % (ref 0.0–0.2)

## 2020-07-19 MED ORDER — SODIUM CHLORIDE 0.9 % IV SOLN
Freq: Once | INTRAVENOUS | Status: AC
  Filled 2020-07-19: qty 250

## 2020-07-19 MED ORDER — HEPARIN SOD (PORK) LOCK FLUSH 100 UNIT/ML IV SOLN
500.0000 [IU] | Freq: Once | INTRAVENOUS | Status: DC | PRN
  Filled 2020-07-19: qty 5

## 2020-07-19 MED ORDER — ACETAMINOPHEN 325 MG PO TABS
650.0000 mg | ORAL_TABLET | Freq: Once | ORAL | Status: AC
  Administered 2020-07-19: 650 mg via ORAL
  Filled 2020-07-19: qty 2

## 2020-07-19 MED ORDER — SODIUM CHLORIDE 0.9% FLUSH
10.0000 mL | INTRAVENOUS | Status: DC | PRN
  Administered 2020-07-19: 10 mL via INTRAVENOUS
  Filled 2020-07-19: qty 10

## 2020-07-19 MED ORDER — DIPHENHYDRAMINE HCL 25 MG PO CAPS
50.0000 mg | ORAL_CAPSULE | Freq: Once | ORAL | Status: AC
  Administered 2020-07-19: 25 mg via ORAL
  Filled 2020-07-19: qty 2

## 2020-07-19 MED ORDER — HEPARIN SOD (PORK) LOCK FLUSH 100 UNIT/ML IV SOLN
500.0000 [IU] | Freq: Once | INTRAVENOUS | Status: AC
  Administered 2020-07-19: 500 [IU] via INTRAVENOUS
  Filled 2020-07-19: qty 5

## 2020-07-19 MED ORDER — TRASTUZUMAB-DKST CHEMO 150 MG IV SOLR
6.0000 mg/kg | Freq: Once | INTRAVENOUS | Status: AC
  Administered 2020-07-19: 504 mg via INTRAVENOUS
  Filled 2020-07-19: qty 24

## 2020-07-19 NOTE — Progress Notes (Signed)
Patient here for oncology follow-up appointment, expresses concerns of ocassional pain in breasts.

## 2020-07-19 NOTE — Progress Notes (Signed)
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 It showed 2 suspicious lesions in the right breast. Diagnostic mammogram and ultrasound confirmed 2 masses in the 9 o'clock position of the right breast measuring 1.1 x 1.2 x 1.2 cm and the other one measuring 0.8 x 0.8 x 0.9 cm. These 2 masses were 1.7 cm apart. Sonographic evaluation of the right axilla did not show any enlarged adenopathy. 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  Baseline MUGA scan showed EF of 58%. MRI showed two distinct masses in the right breast 1.5 cm and 1.2 cm 2 cm apart no additional suspicious masses or abnormal enhancement identified. No suspicious adenopathy  6 cycles of neoadjuvant TCHP chemotherapy completed on 02/29/2020.Final pathology showed residual tumor of 5 mm.Negative margins. 1 sentinel lymph node negative for malignancy. Overall cancer cellularity 1%. Percentage of cancer that is in situ 0%.  [id number]  Patient is started on adjuvant Kadcyla on 04/26/2020.  Treatment complicated by thrombocytopenia despite dose reduction    Interval history- Patient reports having diarrhea for a day or 2 after herceptin but otherwise tolerating well. Has mild chronic fatigue  ECOG PS- 1 Pain scale- 0   Review of systems- Review of Systems  Constitutional: Positive for malaise/fatigue. Negative for chills, fever and weight loss.  HENT: Negative for congestion, ear discharge and nosebleeds.   Eyes: Negative for blurred vision.  Respiratory: Negative for cough, hemoptysis, sputum production, shortness of breath and wheezing.   Cardiovascular: Negative for chest pain, palpitations, orthopnea and claudication.  Gastrointestinal: Positive for diarrhea. Negative for abdominal pain, blood in stool, constipation, heartburn, melena, nausea and vomiting.  Genitourinary: Negative for dysuria, flank pain, frequency, hematuria and urgency.  Musculoskeletal: Negative for back pain, joint pain and myalgias.  Skin: Negative for rash.  Neurological: Negative for dizziness, tingling, focal weakness, seizures, weakness and headaches.  Endo/Heme/Allergies: Does not bruise/bleed easily.  Psychiatric/Behavioral: Negative for depression and suicidal ideas. The patient does not have insomnia.      Allergies  Allergen Reactions  . Statins Rash    Other reaction(s): Muscle Pain Possible hives also     Past Medical History:  Diagnosis Date  . Adverse effect of anesthesia    hard to awaken  . Anemia   . Arthritis   . Asthma   . Asthma without status asthmaticus    unspecified; Take advair each night  . Breast cancer ([place])   . Bronchitis   . Constipation   . Diabetes ([place]) 2010  . Diabetes mellitus type 2, uncomplicated ([place])   . Diabetes mellitus without complication ([place])   . Encounter for blood  transfusion    hysterectomy 9-10 years ago  . Heart murmur    unspecified  . Hyperlipidemia   .  Hypertension 1991  . Hypothyroid   . Multiple thyroid nodules   . Neuromuscular disorder ([facility])    neuropathy  . Osteoarthritis of right hip 02/04/2017  . Palpitations   . Pernicious anemia   . Pneumonia   . Sleep apnea    Use C-PAP; can't use mouth piece. Anxiety  . Thyroid disease    nodules  . Thyroid nodule    x2  . Urinary urgency      Past Surgical History:  Procedure Laterality Date  . ABDOMINAL HYSTERECTOMY  2011   partial  . ABDOMINAL SURGERY    . CARPAL TUNNEL RELEASE Left 2008  . CARPAL TUNNEL RELEASE Right   . CHOLECYSTECTOMY  1982  . COLONOSCOPY WITH PROPOFOL N/A 10/05/2017   Procedure: COLONOSCOPY WITH PROPOFOL;  Surgeon: Manya Silvas, MD;  Location: [facility] ENDOSCOPY;  Service: Endoscopy;  Laterality: N/A;  . DIAGNOSTIC LAPAROSCOPY    . JOINT REPLACEMENT Right 02/09/2017   TOTAL HIP REPLACEMENT, DR. CARTER. [place]  . KNEE ARTHROSCOPY  1990s  . PARTIAL MASTECTOMY WITH NEEDLE LOCALIZATION AND AXILLARY SENTINEL LYMPH NODE BX Right 03/28/2020   Procedure: PARTIAL MASTECTOMY WITH NEEDLE LOCALIZATION AND AXILLARY SENTINEL LYMPH NODE BX;  Surgeon: Herbert Pun, MD;  Location: [facility] ORS;  Service: General;  Laterality: Right;  . PORTACATH PLACEMENT Left 11/07/2019   Procedure: INSERTION PORT-A-CATH;  Surgeon: Herbert Pun, MD;  Location: [facility] ORS;  Service: General;  Laterality: Left;  . TOTAL HIP ARTHROPLASTY Left 06/23/2017   Procedure: TOTAL HIP ARTHROPLASTY ANTERIOR APPROACH;  Surgeon: Hessie Knows, MD;  Location: [facility] ORS;  Service: Orthopedics;  Laterality: Left;    Social History   Socioeconomic History  . Marital status: Married    Spouse name: Not on file  . Number of children: 2  . Years of education: some college  . Highest education level: Not on file  Occupational History  . Occupation: retired    Comment: [employer]  Tobacco Use  . Smoking status: Never Smoker  . Smokeless tobacco: Never Used  Vaping Use   . Vaping Use: Never used  Substance and Sexual Activity  . Alcohol use: No  . Drug use: No  . Sexual activity: Not Currently  Other Topics Concern  . Not on file  Social History Narrative  . Not on file   Social Determinants of Health   Financial Resource Strain:   . Difficulty of Paying Living Expenses: Not on file  Food Insecurity:   . Worried About Charity fundraiser in the Last Year: Not on file  . Ran Out of Food in the Last Year: Not on file  Transportation Needs:   . Lack of Transportation (Medical): Not on file  . Lack of Transportation (Non-Medical): Not on file  Physical Activity:   . Days of Exercise per Week: Not on file  . Minutes of Exercise per Session: Not on file  Stress:   . Feeling of Stress : Not on file  Social Connections:   . Frequency of Communication with Friends and Family: Not on file  . Frequency of Social Gatherings with Friends and Family: Not on file  . Attends Religious Services: Not on file  . Active Member of Clubs or Organizations: Not on file  . Attends Archivist Meetings: Not on file  . Marital Status: Not on file  Intimate  Partner Violence:   . Fear of Current or Ex-Partner: Not on file  . Emotionally Abused: Not on file  . Physically Abused: Not on file  . Sexually Abused: Not on file    Family History  Problem Relation Age of Onset  . Heart disease Mother   . Hypertension Mother   . Arrhythmia Mother   . Heart attack Mother   . Diabetes Mother   . Hypertension Father   . Parkinson's disease Father   . Coronary artery disease Maternal Uncle   . Heart attack Maternal Uncle   . Diabetes Sister   . Diabetes Brother   . Heart disease Brother   . Breast cancer Neg Hx      Current Outpatient Medications:  .  acetaminophen (TYLENOL) 500 MG tablet, Take 500 mg by mouth every 6 (six) hours as needed., Disp: , Rfl:  .  carvedilol (COREG) 25 MG tablet, Take 25 mg by mouth 2 (two) times daily with a meal., Disp: ,  Rfl:  .  cholecalciferol (VITAMIN D) 1000 units tablet, Take 1,000 Units by mouth daily., Disp: , Rfl:  .  Fluticasone-Salmeterol (ADVAIR) 250-50 MCG/DOSE AEPB, Inhale 1 puff into the lungs 2 (two) times daily. , Disp: , Rfl:  .  gabapentin (NEURONTIN) 300 MG capsule, Take 1 capsule (300 mg total) by mouth 3 (three) times daily., Disp: 90 capsule, Rfl: 1 .  lidocaine-prilocaine (EMLA) cream, Apply 1 application topically as needed., Disp: 30 g, Rfl: 0 .  loratadine (CLARITIN) 10 MG tablet, Take 10 mg by mouth daily as needed for allergies. , Disp: , Rfl:  .  metFORMIN (GLUCOPHAGE-XR) 500 MG 24 hr tablet, Take 500 mg by mouth daily with supper. , Disp: , Rfl:  .  nystatin (MYCOSTATIN/NYSTOP) powder, Apply 1 application topically 3 (three) times daily., Disp: 45 g, Rfl: 0 .  potassium chloride (KLOR-CON) 20 MEQ packet, Take 20 mEq by mouth daily., Disp: 90 packet, Rfl: 0 .  solifenacin (VESICARE) 5 MG tablet, Take 10 mg by mouth every evening. , Disp: , Rfl:  .  telmisartan-hydrochlorothiazide (MICARDIS HCT) 80-25 MG tablet, Take 1 tablet by mouth every morning. , Disp: , Rfl:  .  traMADol (ULTRAM) 50 MG tablet, Take 1 tablet (50 mg total) by mouth every 6 (six) hours as needed., Disp: 30 tablet, Rfl: 0 .  albuterol (PROVENTIL HFA;VENTOLIN HFA) 108 (90 Base) MCG/ACT inhaler, Inhale 2 puffs into the lungs every 6 (six) hours as needed for wheezing or shortness of breath. (Patient not taking: Reported on 07/19/2020), Disp: , Rfl:  .  diphenoxylate-atropine (LOMOTIL) 2.5-0.025 MG tablet, Take 1 tablet by mouth 4 (four) times daily as needed for diarrhea or loose stools. (Patient not taking: Reported on 06/28/2020), Disp: 60 tablet, Rfl: 0 .  letrozole (FEMARA) 2.5 MG tablet, Take 1 tablet (2.5 mg total) by mouth daily. (Patient not taking: Reported on 06/28/2020), Disp: 30 tablet, Rfl: 3 .  loperamide (IMODIUM) 2 MG capsule, Take 2 mg by mouth as needed for diarrhea or loose stools. (Patient not taking:  Reported on 07/19/2020), Disp: , Rfl:  .  pantoprazole (PROTONIX) 20 MG tablet, Take 1 tablet (20 mg total) by mouth 2 (two) times daily before a meal. (Patient not taking: Reported on 07/19/2020), Disp: 60 tablet, Rfl: 2 .  triamcinolone cream (KENALOG) 0.5 %, Apply 1 application topically as needed.  (Patient not taking: Reported on 07/19/2020), Disp: , Rfl:  No current facility-administered medications for this visit.  Facility-Administered Medications Ordered in  Other Visits:  .  heparin lock flush 100 unit/mL, 500 Units, Intracatheter, Once PRN, Sindy Guadeloupe, MD .  sodium chloride flush (NS) 0.9 % injection 10 mL, 10 mL, Intravenous, PRN, Sindy Guadeloupe, MD, 10 mL at 01/05/20 1344 .  sodium chloride flush (NS) 0.9 % injection 10 mL, 10 mL, Intravenous, PRN, Sindy Guadeloupe, MD, 10 mL at 05/17/20 0858 .  sodium chloride flush (NS) 0.9 % injection 10 mL, 10 mL, Intravenous, PRN, Sindy Guadeloupe, MD, 10 mL at 07/19/20 0832  Physical exam:  Vitals:   07/19/20 0918  BP: 140/80  Pulse: (!) 56  Resp: 18  Temp: (!) 95 F (35 C)  TempSrc: Tympanic  SpO2: 100%  Weight: 188 lb 6.4 oz (85.5 kg)   Physical Exam Constitutional:      General: She is not in acute distress. Cardiovascular:     Rate and Rhythm: Normal rate and regular rhythm.     Heart sounds: Normal heart sounds.  Pulmonary:     Effort: Pulmonary effort is normal.     Breath sounds: Normal breath sounds.  Abdominal:     General: Bowel sounds are normal.     Palpations: Abdomen is soft.  Skin:    General: Skin is warm and dry.  Neurological:     Mental Status: She is alert and oriented to person, place, and time.      CMP Latest Ref Rng & Units 07/19/2020  Glucose 70 - 99 mg/dL 95  BUN 8 - 23 mg/dL 30(H)  Creatinine 0.44 - 1.00 mg/dL 1.45(H)  Sodium 135 - 145 mmol/L 139  Potassium 3.5 - 5.1 mmol/L 3.7  Chloride 98 - 111 mmol/L 99  CO2 22 - 32 mmol/L 31  Calcium 8.9 - 10.3 mg/dL 9.2  Total Protein 6.5 - 8.1 g/dL 6.8   Total Bilirubin 0.3 - 1.2 mg/dL 0.4  Alkaline Phos 38 - 126 U/L 99  AST 15 - 41 U/L 25  ALT 0 - 44 U/L 17   CBC Latest Ref Rng & Units 07/19/2020  WBC 4.0 - 10.5 K/uL 3.4(L)  Hemoglobin 12.0 - 15.0 g/dL 9.6(L)  Hematocrit 36 - 46 % 31.5(L)  Platelets 150 - 400 K/uL 102(L)    No images are attached to the encounter.  MR BRAIN W WO CONTRAST  Result Date: 07/10/2020 CLINICAL DATA:  Recent breast cancer. Worsening headache with new left arm weakness. EXAM: MRI HEAD WITHOUT AND WITH CONTRAST TECHNIQUE: Multiplanar, multiecho pulse sequences of the brain and surrounding structures were obtained without and with intravenous contrast. CONTRAST:  64m GADAVIST GADOBUTROL 1 MMOL/ML IV SOLN COMPARISON:  None. FINDINGS: Brain: No acute infarction, hemorrhage, hydrocephalus, extra-axial collection or mass lesion. 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 A few scattered foci of T2 hyperintensity are seen within the white matter of the cerebral hemispheres, nonspecific. No focus of abnormal contrast enhancement identified. Vascular: Normal flow voids. Skull and upper cervical spine: Normal marrow signal. Sinuses/Orbits: Mild mucosal thickening of the left maxillary sinus and ethmoid cells. The orbits are maintained. Other: Small right mastoid effusion. IMPRESSION: 1. No acute intracranial abnormality or abnormal contrast enhancement. 2. 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 3. A few scattered foci of T2 hyperintensity within the white matter of the cerebral hemispheres, nonspecific but may represent microvascular ischemic changes.  Electronically Signed   By: Pedro Earls M.D.   On: 07/10/2020 15:15   DG Bone Density  Result Date: 07/09/2020 EXAM: DUAL X-RAY ABSORPTIOMETRY (DXA) FOR BONE MINERAL  DENSITY IMPRESSION: Your patient Chasitty Hehl completed a BMD test on 07/09/2020 using the [place] (software version: 14.10) manufactured by [employer]. The following summarizes the results of our evaluation. Technologist: SCE PATIENT BIOGRAPHICAL: Name: Caraline, Deutschman Patient ID: [id number] Birth Date: 01-Aug-1956 Height: 61.0 in. Gender: Female Exam Date: 07/09/2020 Weight: 185.4 lbs. Indications: Asthma, Bilateral Hip Replacements, Diabetic, Height Loss, History of Breast Cancer, Osteoarthritis, Postmenopausal, Vitamin D Deficiency Fractures: Treatments: Claritin, Gabapentin, Metformin, Protonix, Tramadol, Vitamin D DENSITOMETRY RESULTS: Site         Region     Measured Date Measured Age WHO Classification Young Adult T-score BMD         %Change vs. Previous Significant Change (*) AP Spine L1-L4 07/09/[age] 65.5 Normal 4.1 1.703 g/cm2 - - Left Forearm Radius 33% 07/09/2020 65.5 Normal -0.2 0.861 g/cm2 - - ASSESSMENT: The BMD measured at Forearm Radius 33% is 0.861 g/cm2 with a T-score of -0.2. This patient is considered normal according to [facility]) criteria. The scan quality is good. Bilateral femurs was excluded due to surgical hardware. World Pharmacologist [facility]) criteria for post-menopausal, Caucasian Women: Normal:                   T-score at or above -1 SD Osteopenia/low bone mass: T-score between -1 and -2.5 SD Osteoporosis:             T-score at or below -2.5 SD RECOMMENDATIONS: 1. All patients should optimize calcium and vitamin D intake. 2. Consider FDA-approved medical therapies in postmenopausal women and men aged 22 years and older, based on the following: a. A hip or vertebral(clinical or morphometric) fracture b. T-score < -2.5 at the femoral neck or spine after appropriate evaluation to exclude secondary causes c. Low bone mass (T-score between -1.0 and -2.5 at the femoral neck or spine) and a 10-year probability of a hip fracture > 3%  or a 10-year probability of a major osteoporosis-related fracture > 20% based on the [place]-adapted WHO algorithm 3. Clinician judgment and/or patient preferences may indicate treatment for people with 10-year fracture probabilities above or below these levels FOLLOW-UP: People with diagnosed cases of osteoporosis or at high risk for fracture should have regular bone mineral density tests. For patients eligible for Medicare, routine testing is allowed once every 2 years. The testing frequency can be increased to one year for patients who have rapidly progressing disease, those who are receiving or discontinuing medical therapy to restore bone mass, or have additional risk factors. I have reviewed this report, and agree with the above findings. [facility] Electronically Signed   By: Lowella Grip III M.D.   On: 07/09/2020 10:09     Assessment and plan- Patient is a 64 y.o. female withinvasive mammary carcinoma of the right breast clinical prognostic stage IamcT1 ccN0 cM0 ER/PR positive HER-2/neu positive.She is s/p 6 cycles of neoadjuvant TCHP chemotherapy with residual 5 mm tumor.she is here for on treatment assessment prior to cycle 5 of adjuvant herceptin  Adjuvant kadcyla was stopped due to persistent thrombocytopenia. She is now on adjuvant herceptin which she is tolerating well without significant side effects. She does get significant diarrhea with perjeta. I will therefore hold off on perjeta  herceptin today. Labs, see me and gets  herceptin in 3 weeks  Repeat echo in 5 weeks  Pancytopenia- likely due to kadcyla. thrombocytoenia improving. Continue to monitor  Patient plans to take her covid vaccine today    Visit Diagnosis 1. Invasive carcinoma of breast ([place])   2. High risk medication use   3. Encounter for monoclonal antibody treatment for malignancy   4. Drug-induced pancytopenia ([place])      Dr. Randa Evens, MD, MPH [facility] [phone] 07/19/2020 12:43 PM

## 2020-07-23 ENCOUNTER — Other Ambulatory Visit: Payer: Self-pay

## 2020-07-24 MED ORDER — LETROZOLE 2.5 MG PO TABS: 2.5000 mg | ORAL_TABLET | Freq: Every day | ORAL | 2 refills | Status: DC

## 2020-07-25 ENCOUNTER — Encounter: Payer: Self-pay | Admitting: Radiation Oncology

## 2020-07-25 ENCOUNTER — Other Ambulatory Visit: Payer: Self-pay

## 2020-07-25 ENCOUNTER — Ambulatory Visit
Admission: RE | Admit: 2020-07-25 | Discharge: 2020-07-25 | Disposition: A | Discharge status: Home / Self Care | Payer: Medicare Other | Source: Ambulatory Visit | Attending: Radiation Oncology | Admitting: Radiation Oncology

## 2020-07-25 VITALS — BP 182/90 | HR 80 | Temp 97.1°F | Wt 185.0 lb

## 2020-07-25 DIAGNOSIS — C50111 Malignant neoplasm of central portion of right female breast: Secondary | ICD-10-CM

## 2020-07-25 NOTE — Progress Notes (Signed)
Radiation Oncology Follow up Note  Name: Christina Mcclain   Date:   07/25/2020 MRN:  [id number] DOB: [birth year]    This 64 y.o. female presents to the clinic today for 1 month follow-up status post whole breast radiation to her right breast for triple positive invasive mammary carcinoma.  REFERRING PROVIDER: Kirk Ruths, MD  HPI: Patient is a 64 year old female now at 1 month having completed whole breast radiation for a triple positive invasive mammary carcinoma the right breast status post neoadjuvant chemotherapy followed by wide local excision and sentinel node biopsy seen today in routine follow-up she is doing well still has some incisional shooting pains consistent with healing and scar tissue status post lumpectomy.  She otherwise specifically denies any breast tenderness cough or bone pain..  She is currently on Herceptin and tolerating that well.  COMPLICATIONS OF TREATMENT: none  FOLLOW UP COMPLIANCE: keeps appointments   PHYSICAL EXAM:  BP (!) 182/90 (BP Location: Left Arm, Patient Position: Sitting)   Pulse 80   Temp (!) 97.1 F (36.2 C) (Tympanic)   Wt 185 lb (83.9 kg)   BMI 33.84 kg/m  Lungs are clear to A&P cardiac examination essentially unremarkable with regular rate and rhythm. No dominant mass or nodularity is noted in either breast in 2 positions examined. Incision is well-healed. No axillary or supraclavicular adenopathy is appreciated. Cosmetic result is excellent.  Well-developed well-nourished patient in NAD. HEENT reveals PERLA, EOMI, discs not visualized.  Oral cavity is clear. No oral mucosal lesions are identified. Neck is clear without evidence of cervical or supraclavicular adenopathy. Lungs are clear to A&P. Cardiac examination is essentially unremarkable with regular rate and rhythm without murmur rub or thrill. Abdomen is benign with no organomegaly or masses noted. Motor sensory and DTR levels are equal and symmetric in the upper and lower  extremities. Cranial nerves II through XII are grossly intact. Proprioception is intact. No peripheral adenopathy or edema is identified. No motor or sensory levels are noted. Crude visual fields are within normal range.  RADIOLOGY RESULTS: No current films to review  PLAN: Present time patient is done well 1 month out from whole breast radiation and pleased with her overall progress.  She continues on Herceptin.  I have asked to see her out in 4 to 5 months for follow-up.  Patient knows to call at anytime with any concerns.  I would like to take this opportunity to thank you for allowing me to participate in the care of your patient.Noreene Filbert, MD

## 2020-07-31 ENCOUNTER — Other Ambulatory Visit: Payer: Medicare Other

## 2020-07-31 ENCOUNTER — Inpatient Hospital Stay: Payer: Medicare Other

## 2020-07-31 ENCOUNTER — Inpatient Hospital Stay: Payer: Medicare Other | Attending: Oncology | Admitting: Oncology

## 2020-07-31 ENCOUNTER — Other Ambulatory Visit: Payer: Self-pay | Admitting: *Deleted

## 2020-07-31 ENCOUNTER — Other Ambulatory Visit: Payer: Self-pay

## 2020-07-31 VITALS — BP 179/70 | HR 60 | Temp 96.1°F

## 2020-07-31 DIAGNOSIS — N189 Chronic kidney disease, unspecified: Secondary | ICD-10-CM | POA: Diagnosis not present

## 2020-07-31 DIAGNOSIS — E876 Hypokalemia: Secondary | ICD-10-CM | POA: Insufficient documentation

## 2020-07-31 DIAGNOSIS — C50911 Malignant neoplasm of unspecified site of right female breast: Secondary | ICD-10-CM | POA: Diagnosis present

## 2020-07-31 DIAGNOSIS — E860 Dehydration: Secondary | ICD-10-CM

## 2020-07-31 DIAGNOSIS — Z170 Estrogen receptor positive status [ER+]: Secondary | ICD-10-CM | POA: Diagnosis not present

## 2020-07-31 DIAGNOSIS — Z5112 Encounter for antineoplastic immunotherapy: Secondary | ICD-10-CM | POA: Insufficient documentation

## 2020-07-31 DIAGNOSIS — E871 Hypo-osmolality and hyponatremia: Secondary | ICD-10-CM

## 2020-07-31 LAB — COMPREHENSIVE METABOLIC PANEL
ALT: 17 U/L (ref 0–44)
AST: 26 U/L (ref 15–41)
Albumin: 3.9 g/dL (ref 3.5–5.0)
Alkaline Phosphatase: 90 U/L (ref 38–126)
Anion gap: 7 (ref 5–15)
BUN: 31 mg/dL — ABNORMAL HIGH (ref 8–23)
CO2: 31 mmol/L (ref 22–32)
Calcium: 9.1 mg/dL (ref 8.9–10.3)
Chloride: 101 mmol/L (ref 98–111)
Creatinine, Ser: 1.37 mg/dL — ABNORMAL HIGH (ref 0.44–1.00)
GFR calc Af Amer: 47 mL/min — ABNORMAL LOW (ref >60)
GFR calc non Af Amer: 40 mL/min — ABNORMAL LOW (ref >60)
Glucose, Bld: 104 mg/dL — ABNORMAL HIGH (ref 70–99)
Potassium: 3.4 mmol/L — ABNORMAL LOW (ref 3.5–5.1)
Sodium: 139 mmol/L (ref 135–145)
Total Bilirubin: 0.5 mg/dL (ref 0.3–1.2)
Total Protein: 7 g/dL (ref 6.5–8.1)

## 2020-07-31 MED ORDER — SODIUM CHLORIDE 0.9% FLUSH
10.0000 mL | INTRAVENOUS | Status: DC | PRN
  Administered 2020-07-31: 10 mL via INTRAVENOUS
  Filled 2020-07-31: qty 10

## 2020-07-31 MED ORDER — HEPARIN SOD (PORK) LOCK FLUSH 100 UNIT/ML IV SOLN
500.0000 [IU] | Freq: Once | INTRAVENOUS | Status: AC
  Administered 2020-07-31: 500 [IU] via INTRAVENOUS
  Filled 2020-07-31: qty 5

## 2020-07-31 MED ORDER — HEPARIN SOD (PORK) LOCK FLUSH 100 UNIT/ML IV SOLN
INTRAVENOUS | Status: AC
  Filled 2020-07-31: qty 5

## 2020-07-31 MED ORDER — ACETAMINOPHEN 325 MG PO TABS
650.0000 mg | ORAL_TABLET | Freq: Once | ORAL | Status: AC
  Administered 2020-07-31: 650 mg via ORAL
  Filled 2020-07-31: qty 2

## 2020-07-31 MED ORDER — SODIUM CHLORIDE 0.9 % IV SOLN
Freq: Once | INTRAVENOUS | Status: AC
  Filled 2020-07-31: qty 1000

## 2020-07-31 NOTE — Progress Notes (Signed)
Hematology/Oncology Consult note 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 It showed 2 suspicious lesions in the right breast. Diagnostic mammogram and ultrasound confirmed 2 masses in the 9 o'clock position of the right breast measuring 1.1 x 1.2 x 1.2 cm and the other one measuring 0.8 x 0.8 x 0.9 cm. These 2 masses were 1.7 cm apart. Sonographic evaluation of the right axilla did not show any enlarged adenopathy. 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  Baseline MUGA scan showed EF of 58%. MRI showed two distinct masses in the right breast 1.5 cm and 1.2 cm 2 cm apart no additional suspicious masses or abnormal enhancement identified. No suspicious adenopathy  6 cycles of neoadjuvant TCHP chemotherapy completed on 02/29/2020.Final pathology showed residual tumor of 5 mm.Negative margins. 1 sentinel lymph node negative for malignancy. Overall cancer cellularity 1%. Percentage of cancer that is in situ 0%. [id number]  Patient is started on adjuvant  Kadcyla on 04/26/2020.Treatment complicated by thrombocytopenia despite dose reduction   Interval history-patient reports feeling fatigued over the last few days.  She has been trying to keep up with her oral intake but feels that she falls short of it at times.  Reports having a headache since this morning.  Also reports having retrosternal/epigastric discomfort which has been going on for over a month now.  Reports that the pain is particularly worse when she lays down or sleeps in a certain way.  Occasionally has pain when she takes a deep breath.  Denies any exertional shortness of breath or chest pain.  Reports that her bowel movements are loose but denies any frank diarrhea  ECOG PS- 1 Pain scale- 0   Review of systems- Review of Systems  Constitutional: Positive for malaise/fatigue. Negative for chills, fever and weight loss.  HENT: Negative for congestion, ear discharge and nosebleeds.   Eyes: Negative for blurred vision.  Respiratory: Negative for cough, hemoptysis, sputum production, shortness of breath and wheezing.   Cardiovascular: Negative for chest pain, palpitations, orthopnea and claudication.  Gastrointestinal: Negative for abdominal pain, blood in stool, constipation, diarrhea, heartburn, melena, nausea and vomiting.  Genitourinary: Negative for dysuria, flank pain, frequency, hematuria and urgency.  Musculoskeletal: Negative for back pain, joint pain and myalgias.  Skin: Negative for rash.  Neurological: Negative for dizziness, tingling, focal weakness, seizures, weakness and headaches.  Endo/Heme/Allergies: Does not bruise/bleed easily.  Psychiatric/Behavioral: Negative for depression and suicidal ideas. The patient does not have insomnia.      Allergies  Allergen Reactions  . Statins Rash    Other reaction(s): Muscle Pain Possible hives also     Past Medical History:  Diagnosis Date  . Adverse  effect of anesthesia    hard to awaken  . Anemia   . Arthritis     . Asthma   . Asthma without status asthmaticus    unspecified; Take advair each night  . Breast cancer ([place])   . Bronchitis   . Constipation   . Diabetes ([place]) 2010  . Diabetes mellitus type 2, uncomplicated ([place])   . Diabetes mellitus without complication ([place])   . Encounter for blood transfusion    hysterectomy 9-10 years ago  . Heart murmur    unspecified  . Hyperlipidemia   . Hypertension 1991  . Hypothyroid   . Multiple thyroid nodules   . Neuromuscular disorder ([facility])    neuropathy  . Osteoarthritis of right hip 02/04/2017  . Palpitations   . Pernicious anemia   . Pneumonia   . Sleep apnea    Use C-PAP; can't use mouth piece. Anxiety  . Thyroid disease    nodules  . Thyroid nodule    x2  . Urinary urgency      Past Surgical History:  Procedure Laterality Date  . ABDOMINAL HYSTERECTOMY  2011   partial  . ABDOMINAL SURGERY    . CARPAL TUNNEL RELEASE Left 2008  . CARPAL TUNNEL RELEASE Right   . CHOLECYSTECTOMY  1982  . COLONOSCOPY WITH PROPOFOL N/A 10/05/2017   Procedure: COLONOSCOPY WITH PROPOFOL;  Surgeon: Manya Silvas, MD;  Location: [facility] ENDOSCOPY;  Service: Endoscopy;  Laterality: N/A;  . DIAGNOSTIC LAPAROSCOPY    . JOINT REPLACEMENT Right 02/09/2017   TOTAL HIP REPLACEMENT, DR. CARTER. [place]  . KNEE ARTHROSCOPY  1990s  . PARTIAL MASTECTOMY WITH NEEDLE LOCALIZATION AND AXILLARY SENTINEL LYMPH NODE BX Right 03/28/2020   Procedure: PARTIAL MASTECTOMY WITH NEEDLE LOCALIZATION AND AXILLARY SENTINEL LYMPH NODE BX;  Surgeon: Herbert Pun, MD;  Location: [facility] ORS;  Service: General;  Laterality: Right;  . PORTACATH PLACEMENT Left 11/07/2019   Procedure: INSERTION PORT-A-CATH;  Surgeon: Herbert Pun, MD;  Location: [facility] ORS;  Service: General;  Laterality: Left;  . TOTAL HIP ARTHROPLASTY Left 06/23/2017   Procedure: TOTAL HIP ARTHROPLASTY ANTERIOR APPROACH;  Surgeon: Hessie Knows, MD;  Location: [facility] ORS;  Service: Orthopedics;  Laterality:  Left;    Social History   Socioeconomic History  . Marital status: Married    Spouse name: Not on file  . Number of children: 2  . Years of education: some college  . Highest education level: Not on file  Occupational History  . Occupation: retired    Comment: [employer]  Tobacco Use  . Smoking status: Never Smoker  . Smokeless tobacco: Never Used  Vaping Use  . Vaping Use: Never used  Substance and Sexual Activity  . Alcohol use: No  . Drug use: No  . Sexual activity: Not Currently  Other Topics Concern  . Not on file  Social History Narrative  . Not on file   Social Determinants of Health   Financial Resource Strain:   . Difficulty of Paying Living Expenses: Not on file  Food Insecurity:   . Worried About Charity fundraiser in the Last Year: Not on file  . Ran Out of Food in the Last Year: Not on file  Transportation Needs:   . Lack of Transportation (Medical): Not on file  . Lack of Transportation (Non-Medical): Not on file  Physical Activity:   . Days of Exercise per Week: Not on file  . Minutes of Exercise per Session: Not on file  Stress:   .  Feeling of Stress : Not on file  Social Connections:   . Frequency of Communication with Friends and Family: Not on file  . Frequency of Social Gatherings with Friends and Family: Not on file  . Attends Religious Services: Not on file  . Active Member of Clubs or Organizations: Not on file  . Attends Archivist Meetings: Not on file  . Marital Status: Not on file  Intimate Partner Violence:   . Fear of Current or Ex-Partner: Not on file  . Emotionally Abused: Not on file  . Physically Abused: Not on file  . Sexually Abused: Not on file    Family History  Problem Relation Age of Onset  . Heart disease Mother   . Hypertension Mother   . Arrhythmia Mother   . Heart attack Mother   . Diabetes Mother   . Hypertension Father   . Parkinson's disease Father   . Coronary artery disease  Maternal Uncle   . Heart attack Maternal Uncle   . Diabetes Sister   . Diabetes Brother   . Heart disease Brother   . Breast cancer Neg Hx      Current Outpatient Medications:  .  acetaminophen (TYLENOL) 500 MG tablet, Take 500 mg by mouth every 6 (six) hours as needed., Disp: , Rfl:  .  carvedilol (COREG) 25 MG tablet, Take 25 mg by mouth 2 (two) times daily with a meal., Disp: , Rfl:  .  cholecalciferol (VITAMIN D) 1000 units tablet, Take 1,000 Units by mouth daily., Disp: , Rfl:  .  Fluticasone-Salmeterol (ADVAIR) 250-50 MCG/DOSE AEPB, Inhale 1 puff into the lungs 2 (two) times daily. , Disp: , Rfl:  .  letrozole (FEMARA) 2.5 MG tablet, Take 1 tablet (2.5 mg total) by mouth daily., Disp: 90 tablet, Rfl: 2 .  lidocaine-prilocaine (EMLA) cream, Apply 1 application topically as needed., Disp: 30 g, Rfl: 0 .  loratadine (CLARITIN) 10 MG tablet, Take 10 mg by mouth daily as needed for allergies. , Disp: , Rfl:  .  metFORMIN (GLUCOPHAGE-XR) 500 MG 24 hr tablet, Take 500 mg by mouth daily with supper. , Disp: , Rfl:  .  nystatin (MYCOSTATIN/NYSTOP) powder, Apply 1 application topically 3 (three) times daily., Disp: 45 g, Rfl: 0 .  potassium chloride (KLOR-CON) 20 MEQ packet, Take 20 mEq by mouth daily., Disp: 90 packet, Rfl: 0 .  solifenacin (VESICARE) 5 MG tablet, Take 10 mg by mouth every evening. , Disp: , Rfl:  .  telmisartan-hydrochlorothiazide (MICARDIS HCT) 80-25 MG tablet, Take 1 tablet by mouth every morning. , Disp: , Rfl:  .  traMADol (ULTRAM) 50 MG tablet, Take 1 tablet (50 mg total) by mouth every 6 (six) hours as needed., Disp: 30 tablet, Rfl: 0 .  albuterol (PROVENTIL HFA;VENTOLIN HFA) 108 (90 Base) MCG/ACT inhaler, Inhale 2 puffs into the lungs every 6 (six) hours as needed for wheezing or shortness of breath. (Patient not taking: Reported on 07/19/2020), Disp: , Rfl:  .  diphenoxylate-atropine (LOMOTIL) 2.5-0.025 MG tablet, Take 1 tablet by mouth 4 (four) times daily as needed for  diarrhea or loose stools. (Patient not taking: Reported on 06/28/2020), Disp: 60 tablet, Rfl: 0 .  gabapentin (NEURONTIN) 300 MG capsule, Take 1 capsule (300 mg total) by mouth 3 (three) times daily. (Patient not taking: Reported on 07/31/2020), Disp: 90 capsule, Rfl: 1 .  loperamide (IMODIUM) 2 MG capsule, Take 2 mg by mouth as needed for diarrhea or loose stools. (Patient not taking: Reported on  07/19/2020), Disp: , Rfl:  .  pantoprazole (PROTONIX) 20 MG tablet, Take 1 tablet (20 mg total) by mouth 2 (two) times daily before a meal. (Patient not taking: Reported on 07/19/2020), Disp: 60 tablet, Rfl: 2 .  triamcinolone cream (KENALOG) 0.5 %, Apply 1 application topically as needed.  (Patient not taking: Reported on 07/19/2020), Disp: , Rfl:  No current facility-administered medications for this visit.  Facility-Administered Medications Ordered in Other Visits:  .  sodium chloride flush (NS) 0.9 % injection 10 mL, 10 mL, Intravenous, PRN, Sindy Guadeloupe, MD, 10 mL at 01/05/20 1344 .  sodium chloride flush (NS) 0.9 % injection 10 mL, 10 mL, Intravenous, PRN, Sindy Guadeloupe, MD, 10 mL at 05/17/20 0858 .  sodium chloride flush (NS) 0.9 % injection 10 mL, 10 mL, Intravenous, PRN, Sindy Guadeloupe, MD, 10 mL at 07/31/20 1020  Physical exam:  Vitals:   07/31/20 1046  BP: (!) 179/70  Pulse: 60  Temp: (!) 96.1 F (35.6 C)  TempSrc: Tympanic  SpO2: 100%   Physical Exam Constitutional:      General: She is not in acute distress. HENT:     Mouth/Throat:     Mouth: Mucous membranes are moist.  Cardiovascular:     Rate and Rhythm: Normal rate and regular rhythm.     Heart sounds: Normal heart sounds.  Pulmonary:     Effort: Pulmonary effort is normal.     Breath sounds: Normal breath sounds.  Abdominal:     General: Bowel sounds are normal.     Palpations: Abdomen is soft.  Skin:    General: Skin is warm and dry.  Neurological:     Mental Status: She is alert and oriented to person, place, and  time.      CMP Latest Ref Rng & Units 07/31/2020  Glucose 70 - 99 mg/dL 104(H)  BUN 8 - 23 mg/dL 31(H)  Creatinine 0.44 - 1.00 mg/dL 1.37(H)  Sodium 135 - 145 mmol/L 139  Potassium 3.5 - 5.1 mmol/L 3.4(L)  Chloride 98 - 111 mmol/L 101  CO2 22 - 32 mmol/L 31  Calcium 8.9 - 10.3 mg/dL 9.1  Total Protein 6.5 - 8.1 g/dL 7.0  Total Bilirubin 0.3 - 1.2 mg/dL 0.5  Alkaline Phos 38 - 126 U/L 90  AST 15 - 41 U/L 26  ALT 0 - 44 U/L 17   CBC Latest Ref Rng & Units 07/19/2020  WBC 4.0 - 10.5 K/uL 3.4(L)  Hemoglobin 12.0 - 15.0 g/dL 9.6(L)  Hematocrit 36 - 46 % 31.5(L)  Platelets 150 - 400 K/uL 102(L)    No images are attached to the encounter.  MR BRAIN W WO CONTRAST  Result Date: 07/10/2020 CLINICAL DATA:  Recent breast cancer. Worsening headache with new left arm weakness. EXAM: MRI HEAD WITHOUT AND WITH CONTRAST TECHNIQUE: Multiplanar, multiecho pulse sequences of the brain and surrounding structures were obtained without and with intravenous contrast. CONTRAST:  53m GADAVIST GADOBUTROL 1 MMOL/ML IV SOLN COMPARISON:  None. FINDINGS: Brain: No acute infarction, hemorrhage, hydrocephalus, extra-axial collection or mass lesion. 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 A few scattered foci of T2 hyperintensity are seen within the white matter of the cerebral hemispheres, nonspecific. No focus of abnormal contrast enhancement identified. Vascular: Normal flow voids. Skull and upper cervical spine: Normal marrow signal. Sinuses/Orbits: Mild mucosal thickening of the left maxillary sinus and ethmoid cells. The  orbits are maintained. Other: Small right mastoid effusion. IMPRESSION: 1. No acute intracranial abnormality or abnormal contrast enhancement. 2. 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 3. A few scattered  foci of T2 hyperintensity within the white matter of the cerebral hemispheres, nonspecific but may represent microvascular ischemic changes. Electronically Signed   By: Pedro Earls M.D.   On: 07/10/2020 15:15   DG Bone Density  Result Date: 07/09/2020 EXAM: DUAL X-RAY ABSORPTIOMETRY (DXA) FOR BONE MINERAL DENSITY IMPRESSION: Your patient Christina Mcclain completed a BMD test on 07/09/2020 using the [employer] (software version: 14.10) manufactured by [employer]. The following summarizes the results of our evaluation. Technologist: SCE PATIENT BIOGRAPHICAL: Name: Dharma, Pare Patient ID: [id number] Birth Date: 09/27/1956 Height: 61.0 in. Gender: Female Exam Date: 07/09/2020 Weight: 185.4 lbs. Indications: Asthma, Bilateral Hip Replacements, Diabetic, Height Loss, History of Breast Cancer, Osteoarthritis, Postmenopausal, Vitamin D Deficiency Fractures: Treatments: Claritin, Gabapentin, Metformin, Protonix, Tramadol, Vitamin D DENSITOMETRY RESULTS: Site         Region     Measured Date Measured Age WHO Classification Young Adult T-score BMD         %Change vs. Previous Significant Change (*) AP Spine L1-L4 07/09/[age] 65.5 Normal 4.1 1.703 g/cm2 - - Left Forearm Radius 33% 07/09/2020 65.5 Normal -0.2 0.861 g/cm2 - - ASSESSMENT: The BMD measured at Forearm Radius 33% is 0.861 g/cm2 with a T-score of -0.2. This patient is considered normal according to [employer]) criteria. The scan quality is good. Bilateral femurs was excluded due to surgical hardware. World Pharmacologist [facility]) criteria for post-menopausal, Caucasian Women: Normal:                   T-score at or above -1 SD Osteopenia/low bone mass: T-score between -1 and -2.5 SD Osteoporosis:             T-score at or below -2.5 SD RECOMMENDATIONS: 1. All patients should optimize calcium and vitamin D intake. 2. Consider FDA-approved medical therapies in postmenopausal women and men aged 77  years and older, based on the following: a. A hip or vertebral(clinical or morphometric) fracture b. T-score < -2.5 at the femoral neck or spine after appropriate evaluation to exclude secondary causes c. Low bone mass (T-score between -1.0 and -2.5 at the femoral neck or spine) and a 10-year probability of a hip fracture > 3% or a 10-year probability of a major osteoporosis-related fracture > 20% based on the [place]-adapted WHO algorithm 3. Clinician judgment and/or patient preferences may indicate treatment for people with 10-year fracture probabilities above or below these levels FOLLOW-UP: People with diagnosed cases of osteoporosis or at high risk for fracture should have regular bone mineral density tests. For patients eligible for Medicare, routine testing is allowed once every 2 years. The testing frequency can be increased to one year for patients who have rapidly progressing disease, those who are receiving or discontinuing medical therapy to restore bone mass, or have additional risk factors. I have reviewed this report, and agree with the above findings. [facility] Electronically Signed   By: Lowella Grip III M.D.   On: 07/09/2020 10:09     Assessment and plan- Patient is a 64 y.o. female withinvasive mammary carcinoma of the right breast clinical prognostic stage IamcT1 ccN0 cM0 ER/PR positive HER-2/neu positive.She is s/p 6 cycles of neoadjuvant TCHP chemotherapy with  residual 5 mm tumor.  She is here for acute visit for ongoing fatigue  Labs revealed mild hypokalemia.  She has mild CKD which is essentially stable.  She will proceed with 1 L of IV fluids today along with 20 mEq of potassium.  Patient will continue to take letrozole for her ER positive breast cancer as she has completed radiation.  Retrosternal/epigastric discomfort.  Reports the pain is worse when she lays down and based on history it appears musculoskeletal.  However his symptoms continue I will  consider getting further cardiac work-up done.  I will see her back in 2 weeks as scheduled for next cycle of Herceptin.  She will call us sooner if she has any questions or concerns   Visit Diagnosis 1. Hypokalemia      Dr. Randa Evens, MD, MPH [facility] at [facility] [phone] 07/31/2020 1:10 PM

## 2020-08-09 ENCOUNTER — Inpatient Hospital Stay: Payer: Medicare Other

## 2020-08-09 ENCOUNTER — Encounter: Payer: Self-pay | Admitting: Oncology

## 2020-08-09 ENCOUNTER — Other Ambulatory Visit: Payer: Self-pay

## 2020-08-09 ENCOUNTER — Inpatient Hospital Stay (HOSPITAL_BASED_OUTPATIENT_CLINIC_OR_DEPARTMENT_OTHER): Payer: Medicare Other | Admitting: Oncology

## 2020-08-09 VITALS — BP 166/86 | HR 58 | Temp 96.4°F | Resp 16 | Wt 181.3 lb

## 2020-08-09 DIAGNOSIS — Z79899 Other long term (current) drug therapy: Secondary | ICD-10-CM

## 2020-08-09 DIAGNOSIS — Z5112 Encounter for antineoplastic immunotherapy: Secondary | ICD-10-CM

## 2020-08-09 DIAGNOSIS — C50919 Malignant neoplasm of unspecified site of unspecified female breast: Secondary | ICD-10-CM

## 2020-08-09 LAB — CBC WITH DIFFERENTIAL/PLATELET
Abs Immature Granulocytes: 0.01 10*3/uL (ref 0.00–0.07)
Basophils Absolute: 0.1 10*3/uL (ref 0.0–0.1)
Basophils Relative: 1 %
Eosinophils Absolute: 0.1 10*3/uL (ref 0.0–0.5)
Eosinophils Relative: 3 %
HCT: 32.5 % — ABNORMAL LOW (ref 36.0–46.0)
Hemoglobin: 10.1 g/dL — ABNORMAL LOW (ref 12.0–15.0)
Immature Granulocytes: 0 %
Lymphocytes Relative: 46 %
Lymphs Abs: 1.6 10*3/uL (ref 0.7–4.0)
MCH: 26.1 pg (ref 26.0–34.0)
MCHC: 31.1 g/dL (ref 30.0–36.0)
MCV: 84 fL (ref 80.0–100.0)
Monocytes Absolute: 0.4 10*3/uL (ref 0.1–1.0)
Monocytes Relative: 11 %
Neutro Abs: 1.4 10*3/uL — ABNORMAL LOW (ref 1.7–7.7)
Neutrophils Relative %: 39 %
Platelets: 117 10*3/uL — ABNORMAL LOW (ref 150–400)
RBC: 3.87 MIL/uL (ref 3.87–5.11)
RDW: 15.6 % — ABNORMAL HIGH (ref 11.5–15.5)
WBC: 3.5 10*3/uL — ABNORMAL LOW (ref 4.0–10.5)
nRBC: 0 % (ref 0.0–0.2)

## 2020-08-09 LAB — COMPREHENSIVE METABOLIC PANEL
ALT: 16 U/L (ref 0–44)
AST: 24 U/L (ref 15–41)
Albumin: 3.8 g/dL (ref 3.5–5.0)
Alkaline Phosphatase: 82 U/L (ref 38–126)
Anion gap: 7 (ref 5–15)
BUN: 39 mg/dL — ABNORMAL HIGH (ref 8–23)
CO2: 32 mmol/L (ref 22–32)
Calcium: 9.5 mg/dL (ref 8.9–10.3)
Chloride: 101 mmol/L (ref 98–111)
Creatinine, Ser: 1.45 mg/dL — ABNORMAL HIGH (ref 0.44–1.00)
GFR calc Af Amer: 44 mL/min — ABNORMAL LOW (ref >60)
GFR calc non Af Amer: 38 mL/min — ABNORMAL LOW (ref >60)
Glucose, Bld: 128 mg/dL — ABNORMAL HIGH (ref 70–99)
Potassium: 3.7 mmol/L (ref 3.5–5.1)
Sodium: 140 mmol/L (ref 135–145)
Total Bilirubin: 0.7 mg/dL (ref 0.3–1.2)
Total Protein: 7.1 g/dL (ref 6.5–8.1)

## 2020-08-09 MED ORDER — HEPARIN SOD (PORK) LOCK FLUSH 100 UNIT/ML IV SOLN
INTRAVENOUS | Status: AC
  Filled 2020-08-09: qty 5

## 2020-08-09 MED ORDER — TRASTUZUMAB-DKST CHEMO 150 MG IV SOLR
6.0000 mg/kg | Freq: Once | INTRAVENOUS | Status: AC
  Administered 2020-08-09: 504 mg via INTRAVENOUS
  Filled 2020-08-09: qty 24

## 2020-08-09 MED ORDER — HEPARIN SOD (PORK) LOCK FLUSH 100 UNIT/ML IV SOLN
500.0000 [IU] | Freq: Once | INTRAVENOUS | Status: AC | PRN
  Administered 2020-08-09: 500 [IU]
  Filled 2020-08-09: qty 5

## 2020-08-09 MED ORDER — NYSTATIN 100000 UNIT/GM EX POWD: 1.0000 "application " | Freq: Three times a day (TID) | CUTANEOUS | 0 refills | Status: DC

## 2020-08-09 MED ORDER — ACETAMINOPHEN 325 MG PO TABS
650.0000 mg | ORAL_TABLET | Freq: Once | ORAL | Status: AC
  Administered 2020-08-09: 650 mg via ORAL
  Filled 2020-08-09: qty 2

## 2020-08-09 MED ORDER — NYSTATIN 100000 UNIT/GM EX POWD: 1.0000 "application " | Freq: Three times a day (TID) | CUTANEOUS | 2 refills | Status: DC

## 2020-08-09 MED ORDER — SODIUM CHLORIDE 0.9% FLUSH
10.0000 mL | Freq: Once | INTRAVENOUS | Status: AC
  Administered 2020-08-09: 10 mL via INTRAVENOUS
  Filled 2020-08-09: qty 10

## 2020-08-09 MED ORDER — SODIUM CHLORIDE 0.9% FLUSH
10.0000 mL | INTRAVENOUS | Status: DC | PRN
  Filled 2020-08-09: qty 10

## 2020-08-09 MED ORDER — SODIUM CHLORIDE 0.9 % IV SOLN
Freq: Once | INTRAVENOUS | Status: AC
  Filled 2020-08-09: qty 250

## 2020-08-09 MED ORDER — HEPARIN SOD (PORK) LOCK FLUSH 100 UNIT/ML IV SOLN
500.0000 [IU] | Freq: Once | INTRAVENOUS | Status: DC
  Filled 2020-08-09: qty 5

## 2020-08-09 MED ORDER — DIPHENHYDRAMINE HCL 25 MG PO CAPS
25.0000 mg | ORAL_CAPSULE | Freq: Once | ORAL | Status: AC
  Administered 2020-08-09: 25 mg via ORAL
  Filled 2020-08-09: qty 1

## 2020-08-10 NOTE — Progress Notes (Signed)
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 It showed 2 suspicious lesions in the right breast. Diagnostic mammogram and ultrasound confirmed 2 masses in the 9 o'clock position of the right breast measuring 1.1 x 1.2 x 1.2 cm and the other one measuring 0.8 x 0.8 x 0.9 cm. These 2 masses were 1.7 cm apart. Sonographic evaluation of the right axilla did not show any enlarged adenopathy. 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  Baseline MUGA scan showed EF of 58%. MRI showed two distinct masses in the right breast 1.5 cm and 1.2 cm 2 cm apart no additional suspicious masses or abnormal enhancement identified. No suspicious adenopathy  6 cycles of neoadjuvant TCHP chemotherapy completed on 02/29/2020.Final pathology showed residual tumor of 5 mm.Negative margins. 1 sentinel lymph node negative for malignancy. Overall cancer cellularity 1%. Percentage of cancer that is in situ 0%.  [id number]  Patient is started on adjuvant Kadcyla on 04/26/2020.Treatment complicated by thrombocytopenia despite dose reduction. Patient switched to adjuvant Herceptin. Perjeta not continued due to significant diarrhea in the neoadjuvant setting   Interval history-patient is tolerating single agent Herceptin well without any significant diarrhea. She is also taking letrozole without any significant side effects. Reports chronic fatigue denies other complaints. Neuropathy in her hands and feet is stable  ECOG PS- 1 Pain scale- 0 Opioid associated constipation- no  Review of systems- Review of Systems  Constitutional: Positive for malaise/fatigue. Negative for chills, fever and weight loss.  HENT: Negative for congestion, ear discharge and nosebleeds.   Eyes: Negative for blurred vision.  Respiratory: Negative for cough, hemoptysis, sputum production, shortness of breath and wheezing.   Cardiovascular: Negative for chest pain, palpitations, orthopnea and claudication.  Gastrointestinal: Negative for abdominal pain, blood in stool, constipation, diarrhea, heartburn, melena, nausea and vomiting.  Genitourinary: Negative for dysuria, flank pain, frequency, hematuria and urgency.  Musculoskeletal: Negative for back pain, joint pain and myalgias.  Skin: Negative for rash.  Neurological: Positive for sensory change (Peripheral neuropathy). Negative for dizziness, tingling, focal weakness, seizures, weakness and headaches.  Endo/Heme/Allergies: Does not bruise/bleed easily.  Psychiatric/Behavioral: Negative for depression and suicidal ideas. The patient does not have insomnia.        Allergies  Allergen Reactions  . Statins Rash    Other reaction(s): Muscle Pain Possible hives also     Past Medical History:  Diagnosis Date  . Adverse effect of anesthesia    hard to awaken  . Anemia   . Arthritis   . Asthma   . Asthma without status asthmaticus    unspecified; Take advair each  night  .  Breast cancer ([place])   . Bronchitis   . Constipation   . Diabetes ([place]) 2010  . Diabetes mellitus type 2, uncomplicated ([place])   . Diabetes mellitus without complication ([place])   . Encounter for blood transfusion    hysterectomy 9-10 years ago  . Heart murmur    unspecified  . Hyperlipidemia   . Hypertension 1991  . Hypothyroid   . Multiple thyroid nodules   . Neuromuscular disorder ([facility])    neuropathy  . Osteoarthritis of right hip 02/04/2017  . Palpitations   . Pernicious anemia   . Pneumonia   . Sleep apnea    Use C-PAP; can't use mouth piece. Anxiety  . Thyroid disease    nodules  . Thyroid nodule    x2  . Urinary urgency      Past Surgical History:  Procedure Laterality Date  . ABDOMINAL HYSTERECTOMY  2011   partial  . ABDOMINAL SURGERY    . CARPAL TUNNEL RELEASE Left 2008  . CARPAL TUNNEL RELEASE Right   . CHOLECYSTECTOMY  1982  . COLONOSCOPY WITH PROPOFOL N/A 10/05/2017   Procedure: COLONOSCOPY WITH PROPOFOL;  Surgeon: Manya Silvas, MD;  Location: [facility] ENDOSCOPY;  Service: Endoscopy;  Laterality: N/A;  . DIAGNOSTIC LAPAROSCOPY    . JOINT REPLACEMENT Right 02/09/2017   TOTAL HIP REPLACEMENT, DR. CARTER. [place]  . KNEE ARTHROSCOPY  1990s  . PARTIAL MASTECTOMY WITH NEEDLE LOCALIZATION AND AXILLARY SENTINEL LYMPH NODE BX Right 03/28/2020   Procedure: PARTIAL MASTECTOMY WITH NEEDLE LOCALIZATION AND AXILLARY SENTINEL LYMPH NODE BX;  Surgeon: Herbert Pun, MD;  Location: [facility] ORS;  Service: General;  Laterality: Right;  . PORTACATH PLACEMENT Left 11/07/2019   Procedure: INSERTION PORT-A-CATH;  Surgeon: Herbert Pun, MD;  Location: [facility] ORS;  Service: General;  Laterality: Left;  . TOTAL HIP ARTHROPLASTY Left 06/23/2017   Procedure: TOTAL HIP ARTHROPLASTY ANTERIOR APPROACH;  Surgeon: Hessie Knows, MD;  Location: [facility] ORS;  Service: Orthopedics;  Laterality: Left;    Social History   Socioeconomic History  . Marital status: Married     Spouse name: Not on file  . Number of children: 2  . Years of education: some college  . Highest education level: Not on file  Occupational History  . Occupation: retired    Comment: [employer]  Tobacco Use  . Smoking status: Never Smoker  . Smokeless tobacco: Never Used  Vaping Use  . Vaping Use: Never used  Substance and Sexual Activity  . Alcohol use: No  . Drug use: No  . Sexual activity: Not Currently  Other Topics Concern  . Not on file  Social History Narrative  . Not on file   Social Determinants of Health   Financial Resource Strain:   . Difficulty of Paying Living Expenses: Not on file  Food Insecurity:   . Worried About Charity fundraiser in the Last Year: Not on file  . Ran Out of Food in the Last Year: Not on file  Transportation Needs:   . Lack of Transportation (Medical): Not on file  . Lack of Transportation (Non-Medical): Not on file  Physical Activity:   . Days of Exercise per Week: Not on file  . Minutes of Exercise per Session: Not on file  Stress:   . Feeling of Stress : Not on file  Social Connections:   . Frequency of Communication with Friends and Family: Not on file  . Frequency of Social Gatherings with Friends and Family: Not on file  .  Attends Religious Services: Not on file  . Active Member of Clubs or Organizations: Not on file  . Attends Archivist Meetings: Not on file  . Marital Status: Not on file  Intimate Partner Violence:   . Fear of Current or Ex-Partner: Not on file  . Emotionally Abused: Not on file  . Physically Abused: Not on file  . Sexually Abused: Not on file    Family History  Problem Relation Age of Onset  . Heart disease Mother   . Hypertension Mother   . Arrhythmia Mother   . Heart attack Mother   . Diabetes Mother   . Hypertension Father   . Parkinson's disease Father   . Coronary artery disease Maternal Uncle   . Heart attack Maternal Uncle   . Diabetes Sister   . Diabetes  Brother   . Heart disease Brother   . Breast cancer Neg Hx      Current Outpatient Medications:  .  acetaminophen (TYLENOL) 500 MG tablet, Take 500 mg by mouth every 6 (six) hours as needed., Disp: , Rfl:  .  carvedilol (COREG) 25 MG tablet, Take 25 mg by mouth 2 (two) times daily with a meal., Disp: , Rfl:  .  cholecalciferol (VITAMIN D) 1000 units tablet, Take 1,000 Units by mouth daily., Disp: , Rfl:  .  Fluticasone-Salmeterol (ADVAIR) 250-50 MCG/DOSE AEPB, Inhale 1 puff into the lungs 2 (two) times daily. , Disp: , Rfl:  .  letrozole (FEMARA) 2.5 MG tablet, Take 1 tablet (2.5 mg total) by mouth daily., Disp: 90 tablet, Rfl: 2 .  lidocaine-prilocaine (EMLA) cream, Apply 1 application topically as needed., Disp: 30 g, Rfl: 0 .  loratadine (CLARITIN) 10 MG tablet, Take 10 mg by mouth daily as needed for allergies. , Disp: , Rfl:  .  metFORMIN (GLUCOPHAGE-XR) 500 MG 24 hr tablet, Take 500 mg by mouth daily with supper. , Disp: , Rfl:  .  nystatin (MYCOSTATIN/NYSTOP) powder, Apply 1 application topically 3 (three) times daily., Disp: 60 g, Rfl: 2 .  potassium chloride (KLOR-CON) 20 MEQ packet, Take 20 mEq by mouth daily., Disp: 90 packet, Rfl: 0 .  solifenacin (VESICARE) 5 MG tablet, Take 10 mg by mouth every evening. , Disp: , Rfl:  .  telmisartan-hydrochlorothiazide (MICARDIS HCT) 80-25 MG tablet, Take 1 tablet by mouth every morning. , Disp: , Rfl:  .  traMADol (ULTRAM) 50 MG tablet, Take 1 tablet (50 mg total) by mouth every 6 (six) hours as needed., Disp: 30 tablet, Rfl: 0 .  albuterol (PROVENTIL HFA;VENTOLIN HFA) 108 (90 Base) MCG/ACT inhaler, Inhale 2 puffs into the lungs every 6 (six) hours as needed for wheezing or shortness of breath. (Patient not taking: Reported on 07/19/2020), Disp: , Rfl:  .  diphenoxylate-atropine (LOMOTIL) 2.5-0.025 MG tablet, Take 1 tablet by mouth 4 (four) times daily as needed for diarrhea or loose stools. (Patient not taking: Reported on 06/28/2020), Disp: 60  tablet, Rfl: 0 .  gabapentin (NEURONTIN) 300 MG capsule, Take 1 capsule (300 mg total) by mouth 3 (three) times daily. (Patient not taking: Reported on 07/31/2020), Disp: 90 capsule, Rfl: 1 .  loperamide (IMODIUM) 2 MG capsule, Take 2 mg by mouth as needed for diarrhea or loose stools. (Patient not taking: Reported on 07/19/2020), Disp: , Rfl:  .  pantoprazole (PROTONIX) 20 MG tablet, Take 1 tablet (20 mg total) by mouth 2 (two) times daily before a meal. (Patient not taking: Reported on 07/19/2020), Disp: 60 tablet, Rfl: 2 .  triamcinolone cream (KENALOG) 0.5 %, Apply 1 application topically as needed.  (Patient not taking: Reported on 07/19/2020), Disp: , Rfl:  No current facility-administered medications for this visit.  Facility-Administered Medications Ordered in Other Visits:  .  sodium chloride flush (NS) 0.9 % injection 10 mL, 10 mL, Intravenous, PRN, Sindy Guadeloupe, MD, 10 mL at 01/05/20 1344 .  sodium chloride flush (NS) 0.9 % injection 10 mL, 10 mL, Intravenous, PRN, Sindy Guadeloupe, MD, 10 mL at 05/17/20 0858  Physical exam:  Vitals:   08/09/20 0912  BP: (!) 166/86  Pulse: (!) 58  Resp: 16  Temp: (!) 96.4 F (35.8 C)  TempSrc: Tympanic  SpO2: 100%  Weight: 181 lb 4.8 oz (82.2 kg)   Physical Exam Constitutional:      General: She is not in acute distress. Cardiovascular:     Rate and Rhythm: Normal rate and regular rhythm.     Heart sounds: Normal heart sounds.  Pulmonary:     Effort: Pulmonary effort is normal.     Breath sounds: Normal breath sounds.  Abdominal:     General: Bowel sounds are normal.     Palpations: Abdomen is soft.  Skin:    General: Skin is warm and dry.  Neurological:     Mental Status: She is alert and oriented to person, place, and time.      CMP Latest Ref Rng & Units 08/09/2020  Glucose 70 - 99 mg/dL 128(H)  BUN 8 - 23 mg/dL 39(H)  Creatinine 0.44 - 1.00 mg/dL 1.45(H)  Sodium 135 - 145 mmol/L 140  Potassium 3.5 - 5.1 mmol/L 3.7  Chloride 98  - 111 mmol/L 101  CO2 22 - 32 mmol/L 32  Calcium 8.9 - 10.3 mg/dL 9.5  Total Protein 6.5 - 8.1 g/dL 7.1  Total Bilirubin 0.3 - 1.2 mg/dL 0.7  Alkaline Phos 38 - 126 U/L 82  AST 15 - 41 U/L 24  ALT 0 - 44 U/L 16   CBC Latest Ref Rng & Units 08/09/2020  WBC 4.0 - 10.5 K/uL 3.5(L)  Hemoglobin 12.0 - 15.0 g/dL 10.1(L)  Hematocrit 36 - 46 % 32.5(L)  Platelets 150 - 400 K/uL 117(L)      Assessment and plan- Patient is a 64 y.o. female withinvasive mammary carcinoma of the right breast clinical prognostic stage IamcT1 ccN0 cM0 ER/PR positive HER-2/neu positive.She is s/p 6 cycles of neoadjuvant TCHP chemotherapy with residual 5 mm tumor. She is here for on treatment assessment prior to next cycle of maintenance Herceptin  Counts okay to proceed with next cycle of maintenance Herceptin today. I will see her back in 3 weeks for the next cycle.  Thrombocytopenia is improving and so is her anemia. This is likely secondary to prior Kadcyla  She will continue letrozole at this time. She has completed adjuvant radiation treatment  Discussed role for adjuvant bisphosphonates in the setting of ER positive breast cancer. Discussed possible risks and benefits of bisphosphonates including all but not limited to osteonecrosis of the jaw. We will need dental clearance prior to starting that. We will plan to start that in the next 3 to 6 weeks. Plan is to give that every 6 months up to 5 years   Visit Diagnosis 1. Encounter for monoclonal antibody treatment for malignancy   2. Invasive carcinoma of breast ([place])      Dr. Randa Evens, MD, MPH [facility] at [facility] [phone] 08/10/2020 3:58 PM

## 2020-08-12 ENCOUNTER — Encounter: Payer: Self-pay | Admitting: *Deleted

## 2020-08-21 ENCOUNTER — Ambulatory Visit: Admission: RE | Admit: 2020-08-21 | Payer: Medicare Other | Source: Ambulatory Visit

## 2020-08-23 ENCOUNTER — Other Ambulatory Visit: Payer: Self-pay

## 2020-08-23 ENCOUNTER — Ambulatory Visit
Admission: RE | Admit: 2020-08-23 | Discharge: 2020-08-23 | Disposition: A | Discharge status: Home / Self Care | Payer: Medicare Other | Source: Ambulatory Visit | Attending: Oncology | Admitting: Oncology

## 2020-08-23 DIAGNOSIS — C50919 Malignant neoplasm of unspecified site of unspecified female breast: Secondary | ICD-10-CM | POA: Insufficient documentation

## 2020-08-23 DIAGNOSIS — Z79899 Other long term (current) drug therapy: Secondary | ICD-10-CM

## 2020-08-23 MED ORDER — TECHNETIUM TC 99M-LABELED RED BLOOD CELLS IV KIT
20.0000 | PACK | Freq: Once | INTRAVENOUS | Status: AC | PRN
  Administered 2020-08-23: 23.02 via INTRAVENOUS

## 2020-08-29 ENCOUNTER — Encounter: Payer: Self-pay | Admitting: Oncology

## 2020-08-30 ENCOUNTER — Inpatient Hospital Stay (HOSPITAL_BASED_OUTPATIENT_CLINIC_OR_DEPARTMENT_OTHER): Payer: Medicare Other | Admitting: Oncology

## 2020-08-30 ENCOUNTER — Inpatient Hospital Stay: Payer: Medicare Other | Attending: Oncology

## 2020-08-30 ENCOUNTER — Other Ambulatory Visit: Payer: Self-pay

## 2020-08-30 ENCOUNTER — Encounter: Payer: Self-pay | Admitting: Oncology

## 2020-08-30 ENCOUNTER — Inpatient Hospital Stay: Payer: Medicare Other

## 2020-08-30 VITALS — BP 169/76 | HR 64 | Temp 97.0°F | Resp 20 | Wt 182.3 lb

## 2020-08-30 DIAGNOSIS — Z79899 Other long term (current) drug therapy: Secondary | ICD-10-CM

## 2020-08-30 DIAGNOSIS — Z780 Asymptomatic menopausal state: Secondary | ICD-10-CM

## 2020-08-30 DIAGNOSIS — Z170 Estrogen receptor positive status [ER+]: Secondary | ICD-10-CM | POA: Diagnosis not present

## 2020-08-30 DIAGNOSIS — Z1231 Encounter for screening mammogram for malignant neoplasm of breast: Secondary | ICD-10-CM | POA: Diagnosis not present

## 2020-08-30 DIAGNOSIS — Z5112 Encounter for antineoplastic immunotherapy: Secondary | ICD-10-CM | POA: Insufficient documentation

## 2020-08-30 DIAGNOSIS — C50919 Malignant neoplasm of unspecified site of unspecified female breast: Secondary | ICD-10-CM

## 2020-08-30 DIAGNOSIS — Z1211 Encounter for screening for malignant neoplasm of colon: Secondary | ICD-10-CM

## 2020-08-30 DIAGNOSIS — C50911 Malignant neoplasm of unspecified site of right female breast: Secondary | ICD-10-CM | POA: Insufficient documentation

## 2020-08-30 LAB — CBC WITH DIFFERENTIAL/PLATELET
Abs Immature Granulocytes: 0.01 10*3/uL (ref 0.00–0.07)
Basophils Absolute: 0.1 10*3/uL (ref 0.0–0.1)
Basophils Relative: 2 %
Eosinophils Absolute: 0.1 10*3/uL (ref 0.0–0.5)
Eosinophils Relative: 3 %
HCT: 32.6 % — ABNORMAL LOW (ref 36.0–46.0)
Hemoglobin: 10.2 g/dL — ABNORMAL LOW (ref 12.0–15.0)
Immature Granulocytes: 0 %
Lymphocytes Relative: 45 %
Lymphs Abs: 1.6 10*3/uL (ref 0.7–4.0)
MCH: 26.1 pg (ref 26.0–34.0)
MCHC: 31.3 g/dL (ref 30.0–36.0)
MCV: 83.4 fL (ref 80.0–100.0)
Monocytes Absolute: 0.3 10*3/uL (ref 0.1–1.0)
Monocytes Relative: 10 %
Neutro Abs: 1.4 10*3/uL — ABNORMAL LOW (ref 1.7–7.7)
Neutrophils Relative %: 40 %
Platelets: 118 10*3/uL — ABNORMAL LOW (ref 150–400)
RBC: 3.91 MIL/uL (ref 3.87–5.11)
RDW: 15.7 % — ABNORMAL HIGH (ref 11.5–15.5)
WBC: 3.4 10*3/uL — ABNORMAL LOW (ref 4.0–10.5)
nRBC: 0 % (ref 0.0–0.2)

## 2020-08-30 LAB — COMPREHENSIVE METABOLIC PANEL
ALT: 17 U/L (ref 0–44)
AST: 25 U/L (ref 15–41)
Albumin: 3.9 g/dL (ref 3.5–5.0)
Alkaline Phosphatase: 82 U/L (ref 38–126)
Anion gap: 7 (ref 5–15)
BUN: 28 mg/dL — ABNORMAL HIGH (ref 8–23)
CO2: 32 mmol/L (ref 22–32)
Calcium: 9.7 mg/dL (ref 8.9–10.3)
Chloride: 103 mmol/L (ref 98–111)
Creatinine, Ser: 1.28 mg/dL — ABNORMAL HIGH (ref 0.44–1.00)
GFR calc non Af Amer: 44 mL/min — ABNORMAL LOW (ref >60)
Glucose, Bld: 85 mg/dL (ref 70–99)
Potassium: 3.5 mmol/L (ref 3.5–5.1)
Sodium: 142 mmol/L (ref 135–145)
Total Bilirubin: 0.7 mg/dL (ref 0.3–1.2)
Total Protein: 7.3 g/dL (ref 6.5–8.1)

## 2020-08-30 MED ORDER — ACETAMINOPHEN 325 MG PO TABS
650.0000 mg | ORAL_TABLET | Freq: Once | ORAL | Status: AC
  Administered 2020-08-30: 650 mg via ORAL
  Filled 2020-08-30: qty 2

## 2020-08-30 MED ORDER — SODIUM CHLORIDE 0.9 % IV SOLN
Freq: Once | INTRAVENOUS | Status: AC
  Filled 2020-08-30: qty 250

## 2020-08-30 MED ORDER — SODIUM CHLORIDE 0.9% FLUSH
10.0000 mL | Freq: Once | INTRAVENOUS | Status: AC
  Administered 2020-08-30: 10 mL via INTRAVENOUS
  Filled 2020-08-30: qty 10

## 2020-08-30 MED ORDER — HEPARIN SOD (PORK) LOCK FLUSH 100 UNIT/ML IV SOLN
500.0000 [IU] | Freq: Once | INTRAVENOUS | Status: AC
  Administered 2020-08-30: 500 [IU] via INTRAVENOUS
  Filled 2020-08-30: qty 5

## 2020-08-30 MED ORDER — TRASTUZUMAB-DKST CHEMO 150 MG IV SOLR
6.0000 mg/kg | Freq: Once | INTRAVENOUS | Status: AC
  Administered 2020-08-30: 504 mg via INTRAVENOUS
  Filled 2020-08-30: qty 24

## 2020-08-30 MED ORDER — HEPARIN SOD (PORK) LOCK FLUSH 100 UNIT/ML IV SOLN
INTRAVENOUS | Status: AC
  Filled 2020-08-30: qty 5

## 2020-08-30 MED ORDER — DIPHENHYDRAMINE HCL 25 MG PO CAPS
25.0000 mg | ORAL_CAPSULE | Freq: Once | ORAL | Status: AC
  Administered 2020-08-30: 25 mg via ORAL
  Filled 2020-08-30: qty 1

## 2020-08-30 NOTE — Progress Notes (Signed)
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 It showed 2 suspicious lesions in the right breast. Diagnostic mammogram and ultrasound confirmed 2 masses in the 9 o'clock position of the right breast measuring 1.1 x 1.2 x 1.2 cm and the other one measuring 0.8 x 0.8 x 0.9 cm. These 2 masses were 1.7 cm apart. Sonographic evaluation of the right axilla did not show any enlarged adenopathy. 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  Baseline MUGA scan showed EF of 58%. MRI showed two distinct masses in the right breast 1.5 cm and 1.2 cm 2 cm apart no additional suspicious masses or abnormal enhancement identified. No suspicious adenopathy  6 cycles of neoadjuvant TCHP chemotherapy completed on 02/29/2020.Final pathology showed residual tumor of 5 mm.Negative margins. 1 sentinel lymph node negative for malignancy. Overall cancer cellularity 1%. Percentage of cancer that is in situ 0%.  [id number]  Patient is started on adjuvant Kadcyla on 04/26/2020.Treatment complicated by thrombocytopenia despite dose reduction. Patient switched to adjuvant Herceptin. Perjeta not continued due to significant diarrhea in the neoadjuvant setting   Interval history-has occasional diarrhea with Herceptin but otherwise tolerating well.  Has mild baseline fatigue.  Denies other complaints at this time.  She is using letrozole along with calcium and vitamin D without any significant side effects  ECOG PS- 1 Pain scale- 0 Opioid associated constipation- no  Review of systems- Review of Systems  Constitutional: Positive for malaise/fatigue. Negative for chills, fever and weight loss.  HENT: Negative for congestion, ear discharge and nosebleeds.   Eyes: Negative for blurred vision.  Respiratory: Negative for cough, hemoptysis, sputum production, shortness of breath and wheezing.   Cardiovascular: Negative for chest pain, palpitations, orthopnea and claudication.  Gastrointestinal: Positive for diarrhea. Negative for abdominal pain, blood in stool, constipation, heartburn, melena, nausea and vomiting.  Genitourinary: Negative for dysuria, flank pain, frequency, hematuria and urgency.  Musculoskeletal: Negative for back pain, joint pain and myalgias.  Skin: Negative for rash.  Neurological: Negative for dizziness, tingling, focal weakness, seizures, weakness and headaches.  Endo/Heme/Allergies: Does not bruise/bleed easily.  Psychiatric/Behavioral: Negative for depression and suicidal ideas. The patient does not have insomnia.       Allergies  Allergen Reactions  . Amlodipine Other (See Comments)    edema  . Statins Rash    Other reaction(s): Muscle Pain Possible hives also     Past Medical History:  Diagnosis Date  . Adverse effect of anesthesia    hard to awaken  . Anemia   . Arthritis   . Asthma   . Asthma without status asthmaticus    unspecified; Take advair  each night  .  Breast cancer ([place])   . Bronchitis   . Constipation   . Diabetes ([place]) 2010  . Diabetes mellitus type 2, uncomplicated ([place])   . Diabetes mellitus without complication ([place])   . Encounter for blood transfusion    hysterectomy 9-10 years ago  . Heart murmur    unspecified  . Hyperlipidemia   . Hypertension 1991  . Hypothyroid   . Multiple thyroid nodules   . Neuromuscular disorder ([facility])    neuropathy  . Osteoarthritis of right hip 02/04/2017  . Palpitations   . Pernicious anemia   . Pneumonia   . Sleep apnea    Use C-PAP; can't use mouth piece. Anxiety  . Thyroid disease    nodules  . Thyroid nodule    x2  . Urinary urgency      Past Surgical History:  Procedure Laterality Date  . ABDOMINAL HYSTERECTOMY  2011   partial  . ABDOMINAL SURGERY    . CARPAL TUNNEL RELEASE Left 2008  . CARPAL TUNNEL RELEASE Right   . CHOLECYSTECTOMY  1982  . COLONOSCOPY WITH PROPOFOL N/A 10/05/2017   Procedure: COLONOSCOPY WITH PROPOFOL;  Surgeon: Manya Silvas, MD;  Location: [facility] ENDOSCOPY;  Service: Endoscopy;  Laterality: N/A;  . DIAGNOSTIC LAPAROSCOPY    . JOINT REPLACEMENT Right 02/09/2017   TOTAL HIP REPLACEMENT, DR. CARTER. [place]  . KNEE ARTHROSCOPY  1990s  . PARTIAL MASTECTOMY WITH NEEDLE LOCALIZATION AND AXILLARY SENTINEL LYMPH NODE BX Right 03/28/2020   Procedure: PARTIAL MASTECTOMY WITH NEEDLE LOCALIZATION AND AXILLARY SENTINEL LYMPH NODE BX;  Surgeon: Herbert Pun, MD;  Location: [facility] ORS;  Service: General;  Laterality: Right;  . PORTACATH PLACEMENT Left 11/07/2019   Procedure: INSERTION PORT-A-CATH;  Surgeon: Herbert Pun, MD;  Location: [facility] ORS;  Service: General;  Laterality: Left;  . TOTAL HIP ARTHROPLASTY Left 06/23/2017   Procedure: TOTAL HIP ARTHROPLASTY ANTERIOR APPROACH;  Surgeon: Hessie Knows, MD;  Location: [facility] ORS;  Service: Orthopedics;  Laterality: Left;    Social History   Socioeconomic History  . Marital status: Married    Spouse  name: Not on file  . Number of children: 2  . Years of education: some college  . Highest education level: Not on file  Occupational History  . Occupation: retired    Comment: [employer]  Tobacco Use  . Smoking status: Never Smoker  . Smokeless tobacco: Never Used  Vaping Use  . Vaping Use: Never used  Substance and Sexual Activity  . Alcohol use: No  . Drug use: No  . Sexual activity: Not Currently  Other Topics Concern  . Not on file  Social History Narrative  . Not on file   Social Determinants of Health   Financial Resource Strain:   . Difficulty of Paying Living Expenses: Not on file  Food Insecurity:   . Worried About Charity fundraiser in the Last Year: Not on file  . Ran Out of Food in the Last Year: Not on file  Transportation Needs:   . Lack of Transportation (Medical): Not on file  . Lack of Transportation (Non-Medical): Not on file  Physical Activity:   . Days of Exercise per Week: Not on file  . Minutes of Exercise per Session: Not on file  Stress:   . Feeling of Stress : Not on file  Social Connections:   . Frequency of Communication with Friends and Family: Not on file  . Frequency of Social Gatherings with Friends  and Family: Not on file  . Attends Religious Services: Not on file  . Active Member of Clubs or Organizations: Not on file  . Attends Archivist Meetings: Not on file  . Marital Status: Not on file  Intimate Partner Violence:   . Fear of Current or Ex-Partner: Not on file  . Emotionally Abused: Not on file  . Physically Abused: Not on file  . Sexually Abused: Not on file    Family History  Problem Relation Age of Onset  . Heart disease Mother   . Hypertension Mother   . Arrhythmia Mother   . Heart attack Mother   . Diabetes Mother   . Hypertension Father   . Parkinson's disease Father   . Coronary artery disease Maternal Uncle   . Heart attack Maternal Uncle   . Diabetes Sister   . Diabetes Brother    . Heart disease Brother   . Breast cancer Neg Hx      Current Outpatient Medications:  .  acetaminophen (TYLENOL) 500 MG tablet, Take 500 mg by mouth every 6 (six) hours as needed., Disp: , Rfl:  .  albuterol (PROVENTIL HFA;VENTOLIN HFA) 108 (90 Base) MCG/ACT inhaler, Inhale 2 puffs into the lungs every 6 (six) hours as needed for wheezing or shortness of breath. , Disp: , Rfl:  .  carvedilol (COREG) 25 MG tablet, Take 25 mg by mouth 2 (two) times daily with a meal., Disp: , Rfl:  .  cholecalciferol (VITAMIN D) 1000 units tablet, Take 2,000 Units by mouth daily. , Disp: , Rfl:  .  Fluticasone-Salmeterol (ADVAIR) 250-50 MCG/DOSE AEPB, Inhale 1 puff into the lungs 2 (two) times daily. , Disp: , Rfl:  .  letrozole (FEMARA) 2.5 MG tablet, Take 1 tablet (2.5 mg total) by mouth daily., Disp: 90 tablet, Rfl: 2 .  lidocaine-prilocaine (EMLA) cream, Apply 1 application topically as needed., Disp: 30 g, Rfl: 0 .  loratadine (CLARITIN) 10 MG tablet, Take 10 mg by mouth daily as needed for allergies. , Disp: , Rfl:  .  metFORMIN (GLUCOPHAGE-XR) 500 MG 24 hr tablet, Take 500 mg by mouth daily with supper. , Disp: , Rfl:  .  nystatin (MYCOSTATIN/NYSTOP) powder, Apply 1 application topically 3 (three) times daily., Disp: 60 g, Rfl: 2 .  solifenacin (VESICARE) 5 MG tablet, Take 10 mg by mouth every evening. , Disp: , Rfl:  .  telmisartan-hydrochlorothiazide (MICARDIS HCT) 80-25 MG tablet, Take 1 tablet by mouth every morning. , Disp: , Rfl:  .  diphenoxylate-atropine (LOMOTIL) 2.5-0.025 MG tablet, Take 1 tablet by mouth 4 (four) times daily as needed for diarrhea or loose stools. (Patient not taking: Reported on 06/28/2020), Disp: 60 tablet, Rfl: 0 .  loperamide (IMODIUM) 2 MG capsule, Take 2 mg by mouth as needed for diarrhea or loose stools. (Patient not taking: Reported on 07/19/2020), Disp: , Rfl:  .  pantoprazole (PROTONIX) 20 MG tablet, Take 1 tablet (20 mg total) by mouth 2 (two) times daily before a meal.  (Patient not taking: Reported on 07/19/2020), Disp: 60 tablet, Rfl: 2 .  potassium chloride (KLOR-CON) 20 MEQ packet, Take 20 mEq by mouth daily. (Patient not taking: Reported on 08/29/2020), Disp: 90 packet, Rfl: 0 .  traMADol (ULTRAM) 50 MG tablet, Take 1 tablet (50 mg total) by mouth every 6 (six) hours as needed. (Patient not taking: Reported on 08/29/2020), Disp: 30 tablet, Rfl: 0 No current facility-administered medications for this visit.  Facility-Administered Medications Ordered in Other Visits:  .  heparin lock flush 100 unit/mL, 500 Units, Intravenous, Once, Randa Evens C, MD .  sodium chloride flush (NS) 0.9 % injection 10 mL, 10 mL, Intravenous, PRN, Sindy Guadeloupe, MD, 10 mL at 01/05/20 1344 .  sodium chloride flush (NS) 0.9 % injection 10 mL, 10 mL, Intravenous, PRN, Sindy Guadeloupe, MD, 10 mL at 05/17/20 0858 .  trastuzumab-dkst (OGIVRI) 504 mg in sodium chloride 0.9 % 250 mL chemo infusion, 6 mg/kg (Treatment Plan Recorded), Intravenous, Once, Sindy Guadeloupe, MD, Last Rate: 548 mL/hr at 08/30/20 1029, 504 mg at 08/30/20 1029  Physical exam:  Vitals:   08/30/20 0852  BP: (!) 169/76  Pulse: 64  Resp: 20  Temp: (!) 97 F (36.1 C)  SpO2: 98%  Weight: 182 lb 4.8 oz (82.7 kg)   Physical Exam Constitutional:      General: She is not in acute distress. Cardiovascular:     Rate and Rhythm: Normal rate and regular rhythm.     Heart sounds: Normal heart sounds.  Pulmonary:     Effort: Pulmonary effort is normal.     Breath sounds: Normal breath sounds.  Abdominal:     General: Bowel sounds are normal.     Palpations: Abdomen is soft.  Skin:    General: Skin is warm and dry.  Neurological:     Mental Status: She is alert and oriented to person, place, and time.      CMP Latest Ref Rng & Units 08/30/2020  Glucose 70 - 99 mg/dL 85  BUN 8 - 23 mg/dL 28(H)  Creatinine 0.44 - 1.00 mg/dL 1.28(H)  Sodium 135 - 145 mmol/L 142  Potassium 3.5 - 5.1 mmol/L 3.5  Chloride 98 - 111  mmol/L 103  CO2 22 - 32 mmol/L 32  Calcium 8.9 - 10.3 mg/dL 9.7  Total Protein 6.5 - 8.1 g/dL 7.3  Total Bilirubin 0.3 - 1.2 mg/dL 0.7  Alkaline Phos 38 - 126 U/L 82  AST 15 - 41 U/L 25  ALT 0 - 44 U/L 17   CBC Latest Ref Rng & Units 08/30/2020  WBC 4.0 - 10.5 K/uL 3.4(L)  Hemoglobin 12.0 - 15.0 g/dL 10.2(L)  Hematocrit 36 - 46 % 32.6(L)  Platelets 150 - 400 K/uL 118(L)    No images are attached to the encounter.  NM Cardiac Muga Rest  Result Date: 08/24/2020 CLINICAL DATA:  Breast cancer. Evaluate cardiac function in relation to chemotherapy. EXAM: NUCLEAR MEDICINE CARDIAC BLOOD POOL IMAGING (MUGA) TECHNIQUE: Cardiac multi-gated acquisition was performed at rest following intravenous injection of Tc-62mlabeled red blood cells. RADIOPHARMACEUTICALS:  Twenty-two mCi Tc-970mertechnetate in-vitro labeled red blood cells IV COMPARISON:  MUGA scan 05/08/2020 FINDINGS: No  focal wall motion abnormality of the left ventricle. Calculated left ventricular ejection fraction equals 62% Comparable to 62 % on MUGA scan 05/08/2020. (Of note, MUGA scan of 05/09/2019 was incorrectly reported as 65%. The report has been addended.). IMPRESSION: Left ventricular ejection fraction equals62 %. Electronically Signed   By: StSuzy Bouchard.D.   On: 08/24/2020 09:28     Assessment and plan- Patient is a [age].o. female withinvasive mammary carcinoma of the right breast clinical prognostic stage IamcT1 ccN0 cM0 ER/PR positive HER-2/neu positive.She is s/p 6 cycles of neoadjuvant TCHP chemotherapy with residual 5 mm tumor.   She is here for on treatment assessment prior to next cycle of maintenance Herceptin  Counts okay to proceed with maintenance Herceptin today.  Platelet counts are slowly improving.  She has  baseline anemia even prior to starting chemotherapy and her hemoglobin runs around 10 and presently stable at that level.  She will directly proceed for Herceptin in 3 weeks and I will see her back in  6 weeks  Patient will continue letrozole along with calcium and vitamin D.  Patient does have some gum disease as well as root canal treatments that need to be done and therefore has not received clearance for Zometa yet.   Visit Diagnosis 1. Screening mammogram for breast cancer   2. Invasive carcinoma of breast ([place])   3. High risk medication use   4. Postmenopausal   5. Special screening for malignant neoplasms, colon      Dr. Randa Evens, MD, MPH [facility] [phone] 08/30/2020 10:34 AM

## 2020-09-17 ENCOUNTER — Other Ambulatory Visit: Payer: Self-pay | Admitting: Internal Medicine

## 2020-09-17 DIAGNOSIS — R748 Abnormal levels of other serum enzymes: Secondary | ICD-10-CM

## 2020-09-20 ENCOUNTER — Other Ambulatory Visit: Payer: Self-pay

## 2020-09-20 ENCOUNTER — Inpatient Hospital Stay: Payer: Medicare Other

## 2020-09-20 ENCOUNTER — Other Ambulatory Visit: Payer: Self-pay | Admitting: Oncology

## 2020-09-20 VITALS — BP 149/74 | HR 66 | Temp 96.4°F | Resp 18 | Wt 181.0 lb

## 2020-09-20 DIAGNOSIS — Z5112 Encounter for antineoplastic immunotherapy: Secondary | ICD-10-CM | POA: Diagnosis not present

## 2020-09-20 DIAGNOSIS — C50919 Malignant neoplasm of unspecified site of unspecified female breast: Secondary | ICD-10-CM

## 2020-09-20 LAB — CBC WITH DIFFERENTIAL/PLATELET
Abs Immature Granulocytes: 0 10*3/uL (ref 0.00–0.07)
Basophils Absolute: 0 10*3/uL (ref 0.0–0.1)
Basophils Relative: 1 %
Eosinophils Absolute: 0.1 10*3/uL (ref 0.0–0.5)
Eosinophils Relative: 2 %
HCT: 32.3 % — ABNORMAL LOW (ref 36.0–46.0)
Hemoglobin: 10.2 g/dL — ABNORMAL LOW (ref 12.0–15.0)
Immature Granulocytes: 0 %
Lymphocytes Relative: 36 %
Lymphs Abs: 1.5 10*3/uL (ref 0.7–4.0)
MCH: 26.4 pg (ref 26.0–34.0)
MCHC: 31.6 g/dL (ref 30.0–36.0)
MCV: 83.7 fL (ref 80.0–100.0)
Monocytes Absolute: 0.4 10*3/uL (ref 0.1–1.0)
Monocytes Relative: 10 %
Neutro Abs: 2.1 10*3/uL (ref 1.7–7.7)
Neutrophils Relative %: 51 %
Platelets: 117 10*3/uL — ABNORMAL LOW (ref 150–400)
RBC: 3.86 MIL/uL — ABNORMAL LOW (ref 3.87–5.11)
RDW: 16 % — ABNORMAL HIGH (ref 11.5–15.5)
WBC: 4.2 10*3/uL (ref 4.0–10.5)
nRBC: 0 % (ref 0.0–0.2)

## 2020-09-20 LAB — COMPREHENSIVE METABOLIC PANEL
ALT: 13 U/L (ref 0–44)
AST: 21 U/L (ref 15–41)
Albumin: 3.9 g/dL (ref 3.5–5.0)
Alkaline Phosphatase: 80 U/L (ref 38–126)
Anion gap: 7 (ref 5–15)
BUN: 39 mg/dL — ABNORMAL HIGH (ref 8–23)
CO2: 31 mmol/L (ref 22–32)
Calcium: 9.5 mg/dL (ref 8.9–10.3)
Chloride: 105 mmol/L (ref 98–111)
Creatinine, Ser: 1.57 mg/dL — ABNORMAL HIGH (ref 0.44–1.00)
GFR, Estimated: 36 mL/min — ABNORMAL LOW (ref >60)
Glucose, Bld: 104 mg/dL — ABNORMAL HIGH (ref 70–99)
Potassium: 4 mmol/L (ref 3.5–5.1)
Sodium: 143 mmol/L (ref 135–145)
Total Bilirubin: 0.6 mg/dL (ref 0.3–1.2)
Total Protein: 7.1 g/dL (ref 6.5–8.1)

## 2020-09-20 MED ORDER — SODIUM CHLORIDE 0.9 % IV SOLN
Freq: Once | INTRAVENOUS | Status: AC
  Filled 2020-09-20: qty 250

## 2020-09-20 MED ORDER — DIPHENHYDRAMINE HCL 25 MG PO CAPS
25.0000 mg | ORAL_CAPSULE | Freq: Once | ORAL | Status: AC
  Administered 2020-09-20: 25 mg via ORAL
  Filled 2020-09-20: qty 1

## 2020-09-20 MED ORDER — SODIUM CHLORIDE 0.9% FLUSH
10.0000 mL | INTRAVENOUS | Status: DC | PRN
  Administered 2020-09-20: 10 mL via INTRAVENOUS
  Filled 2020-09-20: qty 10

## 2020-09-20 MED ORDER — HEPARIN SOD (PORK) LOCK FLUSH 100 UNIT/ML IV SOLN
500.0000 [IU] | Freq: Once | INTRAVENOUS | Status: AC
  Administered 2020-09-20: 500 [IU] via INTRAVENOUS
  Filled 2020-09-20: qty 5

## 2020-09-20 MED ORDER — TRASTUZUMAB-DKST CHEMO 150 MG IV SOLR
6.0000 mg/kg | Freq: Once | INTRAVENOUS | Status: AC
  Administered 2020-09-20: 504 mg via INTRAVENOUS
  Filled 2020-09-20: qty 24

## 2020-09-20 MED ORDER — HEPARIN SOD (PORK) LOCK FLUSH 100 UNIT/ML IV SOLN
INTRAVENOUS | Status: AC
  Filled 2020-09-20: qty 5

## 2020-09-20 MED ORDER — ACETAMINOPHEN 325 MG PO TABS
650.0000 mg | ORAL_TABLET | Freq: Once | ORAL | Status: AC
  Administered 2020-09-20: 650 mg via ORAL
  Filled 2020-09-20: qty 2

## 2020-09-20 NOTE — Progress Notes (Signed)
Creatinine: 1.57. MD, Dr. Janese Banks, notified and aware. Per MD order: proceed with scheduled Ogivri treatment today. MD recommends patient increase her fluid intake at home. Patient educated and verbalized understanding.  1225- Patient tolerated treatment well. Patient discharged to home at this time.

## 2020-09-24 ENCOUNTER — Other Ambulatory Visit: Payer: Self-pay

## 2020-09-24 ENCOUNTER — Ambulatory Visit
Admission: RE | Admit: 2020-09-24 | Discharge: 2020-09-24 | Disposition: A | Discharge status: Home / Self Care | Payer: Medicare Other | Source: Ambulatory Visit | Attending: Oncology | Admitting: Oncology

## 2020-09-24 DIAGNOSIS — C50919 Malignant neoplasm of unspecified site of unspecified female breast: Secondary | ICD-10-CM | POA: Diagnosis present

## 2020-09-24 DIAGNOSIS — Z79899 Other long term (current) drug therapy: Secondary | ICD-10-CM | POA: Insufficient documentation

## 2020-09-24 DIAGNOSIS — Z1231 Encounter for screening mammogram for malignant neoplasm of breast: Secondary | ICD-10-CM

## 2020-09-24 DIAGNOSIS — Z780 Asymptomatic menopausal state: Secondary | ICD-10-CM | POA: Diagnosis present

## 2020-09-24 HISTORY — DX: Personal history of irradiation: Z92.3

## 2020-09-25 ENCOUNTER — Other Ambulatory Visit: Payer: Self-pay | Admitting: Oncology

## 2020-09-25 ENCOUNTER — Ambulatory Visit
Admission: RE | Admit: 2020-09-25 | Discharge: 2020-09-25 | Disposition: A | Discharge status: Home / Self Care | Payer: Medicare Other | Source: Ambulatory Visit | Attending: Internal Medicine | Admitting: Internal Medicine

## 2020-09-25 DIAGNOSIS — R921 Mammographic calcification found on diagnostic imaging of breast: Secondary | ICD-10-CM

## 2020-09-25 DIAGNOSIS — R928 Other abnormal and inconclusive findings on diagnostic imaging of breast: Secondary | ICD-10-CM

## 2020-09-25 DIAGNOSIS — R748 Abnormal levels of other serum enzymes: Secondary | ICD-10-CM | POA: Diagnosis present

## 2020-10-02 ENCOUNTER — Other Ambulatory Visit: Payer: Self-pay

## 2020-10-02 ENCOUNTER — Ambulatory Visit
Admission: RE | Admit: 2020-10-02 | Discharge: 2020-10-02 | Disposition: A | Discharge status: Home / Self Care | Payer: Medicare Other | Source: Ambulatory Visit | Attending: Oncology | Admitting: Oncology

## 2020-10-02 DIAGNOSIS — R921 Mammographic calcification found on diagnostic imaging of breast: Secondary | ICD-10-CM

## 2020-10-02 DIAGNOSIS — R928 Other abnormal and inconclusive findings on diagnostic imaging of breast: Secondary | ICD-10-CM

## 2020-10-02 HISTORY — PX: BREAST BIOPSY: SHX20

## 2020-10-04 LAB — SURGICAL PATHOLOGY

## 2020-10-10 ENCOUNTER — Other Ambulatory Visit: Payer: Self-pay | Admitting: *Deleted

## 2020-10-10 DIAGNOSIS — C50919 Malignant neoplasm of unspecified site of unspecified female breast: Secondary | ICD-10-CM

## 2020-10-11 ENCOUNTER — Inpatient Hospital Stay: Payer: Medicare Other

## 2020-10-11 ENCOUNTER — Inpatient Hospital Stay (HOSPITAL_BASED_OUTPATIENT_CLINIC_OR_DEPARTMENT_OTHER): Payer: Medicare Other | Admitting: Oncology

## 2020-10-11 ENCOUNTER — Other Ambulatory Visit: Payer: Self-pay | Admitting: *Deleted

## 2020-10-11 ENCOUNTER — Encounter: Payer: Self-pay | Admitting: Oncology

## 2020-10-11 ENCOUNTER — Inpatient Hospital Stay: Payer: Medicare Other | Attending: Oncology

## 2020-10-11 VITALS — BP 113/59 | HR 97 | Temp 96.2°F | Resp 16 | Wt 185.3 lb

## 2020-10-11 DIAGNOSIS — C50911 Malignant neoplasm of unspecified site of right female breast: Secondary | ICD-10-CM | POA: Insufficient documentation

## 2020-10-11 DIAGNOSIS — Z79899 Other long term (current) drug therapy: Secondary | ICD-10-CM | POA: Diagnosis not present

## 2020-10-11 DIAGNOSIS — Z780 Asymptomatic menopausal state: Secondary | ICD-10-CM

## 2020-10-11 DIAGNOSIS — C50919 Malignant neoplasm of unspecified site of unspecified female breast: Secondary | ICD-10-CM

## 2020-10-11 DIAGNOSIS — Z5112 Encounter for antineoplastic immunotherapy: Secondary | ICD-10-CM | POA: Insufficient documentation

## 2020-10-11 DIAGNOSIS — Z170 Estrogen receptor positive status [ER+]: Secondary | ICD-10-CM | POA: Insufficient documentation

## 2020-10-11 DIAGNOSIS — Z9189 Other specified personal risk factors, not elsewhere classified: Secondary | ICD-10-CM

## 2020-10-11 LAB — CBC WITH DIFFERENTIAL/PLATELET
Abs Immature Granulocytes: 0.01 10*3/uL (ref 0.00–0.07)
Basophils Absolute: 0.1 10*3/uL (ref 0.0–0.1)
Basophils Relative: 2 %
Eosinophils Absolute: 0.1 10*3/uL (ref 0.0–0.5)
Eosinophils Relative: 2 %
HCT: 32.2 % — ABNORMAL LOW (ref 36.0–46.0)
Hemoglobin: 10.1 g/dL — ABNORMAL LOW (ref 12.0–15.0)
Immature Granulocytes: 0 %
Lymphocytes Relative: 45 %
Lymphs Abs: 1.8 10*3/uL (ref 0.7–4.0)
MCH: 26.3 pg (ref 26.0–34.0)
MCHC: 31.4 g/dL (ref 30.0–36.0)
MCV: 83.9 fL (ref 80.0–100.0)
Monocytes Absolute: 0.4 10*3/uL (ref 0.1–1.0)
Monocytes Relative: 11 %
Neutro Abs: 1.6 10*3/uL — ABNORMAL LOW (ref 1.7–7.7)
Neutrophils Relative %: 40 %
Platelets: 133 10*3/uL — ABNORMAL LOW (ref 150–400)
RBC: 3.84 MIL/uL — ABNORMAL LOW (ref 3.87–5.11)
RDW: 16.2 % — ABNORMAL HIGH (ref 11.5–15.5)
WBC: 4 10*3/uL (ref 4.0–10.5)
nRBC: 0 % (ref 0.0–0.2)

## 2020-10-11 LAB — COMPREHENSIVE METABOLIC PANEL
ALT: 12 U/L (ref 0–44)
AST: 19 U/L (ref 15–41)
Albumin: 3.8 g/dL (ref 3.5–5.0)
Alkaline Phosphatase: 79 U/L (ref 38–126)
Anion gap: 9 (ref 5–15)
BUN: 53 mg/dL — ABNORMAL HIGH (ref 8–23)
CO2: 28 mmol/L (ref 22–32)
Calcium: 9.6 mg/dL (ref 8.9–10.3)
Chloride: 102 mmol/L (ref 98–111)
Creatinine, Ser: 1.72 mg/dL — ABNORMAL HIGH (ref 0.44–1.00)
GFR, Estimated: 33 mL/min — ABNORMAL LOW (ref >60)
Glucose, Bld: 114 mg/dL — ABNORMAL HIGH (ref 70–99)
Potassium: 4.8 mmol/L (ref 3.5–5.1)
Sodium: 139 mmol/L (ref 135–145)
Total Bilirubin: 0.5 mg/dL (ref 0.3–1.2)
Total Protein: 6.8 g/dL (ref 6.5–8.1)

## 2020-10-11 MED ORDER — HEPARIN SOD (PORK) LOCK FLUSH 100 UNIT/ML IV SOLN
500.0000 [IU] | Freq: Once | INTRAVENOUS | Status: AC | PRN
  Administered 2020-10-11: 500 [IU]
  Filled 2020-10-11: qty 5

## 2020-10-11 MED ORDER — DIPHENHYDRAMINE HCL 25 MG PO CAPS
25.0000 mg | ORAL_CAPSULE | Freq: Once | ORAL | Status: AC
  Administered 2020-10-11: 25 mg via ORAL
  Filled 2020-10-11: qty 1

## 2020-10-11 MED ORDER — HEPARIN SOD (PORK) LOCK FLUSH 100 UNIT/ML IV SOLN
INTRAVENOUS | Status: AC
  Filled 2020-10-11: qty 5

## 2020-10-11 MED ORDER — SODIUM CHLORIDE 0.9 % IV SOLN
Freq: Once | INTRAVENOUS | Status: AC
  Filled 2020-10-11: qty 250

## 2020-10-11 MED ORDER — TRASTUZUMAB-DKST CHEMO 150 MG IV SOLR
6.0000 mg/kg | Freq: Once | INTRAVENOUS | Status: AC
  Administered 2020-10-11: 504 mg via INTRAVENOUS
  Filled 2020-10-11: qty 24

## 2020-10-11 MED ORDER — ACETAMINOPHEN 325 MG PO TABS
650.0000 mg | ORAL_TABLET | Freq: Once | ORAL | Status: AC
  Administered 2020-10-11: 650 mg via ORAL
  Filled 2020-10-11: qty 2

## 2020-10-11 MED ORDER — SODIUM CHLORIDE 0.9% FLUSH
10.0000 mL | Freq: Once | INTRAVENOUS | Status: AC
  Administered 2020-10-11: 10 mL via INTRAVENOUS
  Filled 2020-10-11: qty 10

## 2020-10-11 MED ORDER — NYSTATIN 100000 UNIT/GM EX POWD: 1.0000 "application " | Freq: Three times a day (TID) | CUTANEOUS | 2 refills | Status: DC

## 2020-10-11 NOTE — Progress Notes (Signed)
Pt doing well. Still some neuropathy in fingers and toes. Pt having dental procedures scheduled for Jan. And waiting til after that to start bisphosphonates

## 2020-10-11 NOTE — Progress Notes (Signed)
1103- Patient tolerated treatment well. Patient discharged to home at this time.

## 2020-10-12 NOTE — Progress Notes (Signed)
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 It showed 2 suspicious lesions in the right breast. Diagnostic mammogram and ultrasound confirmed 2 masses in the 9 o'clock position of the right breast measuring 1.1 x 1.2 x 1.2 cm and the other one measuring 0.8 x 0.8 x 0.9 cm. These 2 masses were 1.7 cm apart. Sonographic evaluation of the right axilla did not show any enlarged adenopathy. 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  Baseline MUGA scan showed EF of 58%. MRI showed two distinct masses in the right breast 1.5 cm and 1.2 cm 2 cm apart no additional suspicious masses or abnormal enhancement identified. No suspicious adenopathy  6 cycles of neoadjuvant TCHP chemotherapy completed on 02/29/2020.Final pathology showed residual tumor of 5 mm.Negative margins. 1 sentinel lymph node negative for malignancy. Overall cancer cellularity 1%. Percentage of cancer that is in situ 0%.  [id number]  Patient is started on adjuvant Kadcyla on 04/26/2020.Treatment complicated by thrombocytopenia despite dose reduction.Patient switched to adjuvant Herceptin. Perjeta not continued due to significant diarrhea in the neoadjuvant setting   Interval history-reports tolerating Herceptin well with occasional diarrhea.  Energy levels are improving.  She is using letrozole calcium and vitamin D without any significant side effects.  ECOG PS- 1 Pain scale- 0 Opioid associated constipation- no  Review of systems- Review of Systems  Constitutional: Positive for malaise/fatigue. Negative for chills, fever and weight loss.  HENT: Negative for congestion, ear discharge and nosebleeds.   Eyes: Negative for blurred vision.  Respiratory: Negative for cough, hemoptysis, sputum production, shortness of breath and wheezing.   Cardiovascular: Negative for chest pain, palpitations, orthopnea and claudication.  Gastrointestinal: Negative for abdominal pain, blood in stool, constipation, diarrhea, heartburn, melena, nausea and vomiting.  Genitourinary: Negative for dysuria, flank pain, frequency, hematuria and urgency.  Musculoskeletal: Negative for back pain, joint pain and myalgias.  Skin: Negative for rash.  Neurological: Negative for dizziness, tingling, focal weakness, seizures, weakness and headaches.  Endo/Heme/Allergies: Does not bruise/bleed easily.  Psychiatric/Behavioral: Negative for depression and suicidal ideas. The patient does not have insomnia.       Allergies  Allergen Reactions  . Amlodipine Other (See Comments)    edema  . Statins Rash    Other reaction(s): Muscle Pain Possible hives also     Past Medical History:  Diagnosis Date  . Adverse effect of anesthesia    hard to awaken  . Anemia   . Arthritis   . Asthma   . Asthma without status asthmaticus    unspecified; Take advair each night  . Breast cancer ([place])   . Bronchitis   .  Constipation   . Diabetes  ([place]) 2010  . Diabetes mellitus type 2, uncomplicated ([place])   . Diabetes mellitus without complication ([place])   . Encounter for blood transfusion    hysterectomy 9-10 years ago  . Heart murmur    unspecified  . Hyperlipidemia   . Hypertension 1991  . Hypothyroid   . Multiple thyroid nodules   . Neuromuscular disorder ([facility])    neuropathy  . Osteoarthritis of right hip 02/04/2017  . Palpitations   . Pernicious anemia   . Personal history of radiation therapy   . Pneumonia   . Sleep apnea    Use C-PAP; can't use mouth piece. Anxiety  . Thyroid disease    nodules  . Thyroid nodule    x2  . Urinary urgency      Past Surgical History:  Procedure Laterality Date  . ABDOMINAL HYSTERECTOMY  2011   partial  . ABDOMINAL SURGERY    . BREAST BIOPSY Right 10/02/2020   affirm bx, x marker, path pending  . BREAST LUMPECTOMY Right    2021   . CARPAL TUNNEL RELEASE Left 2008  . CARPAL TUNNEL RELEASE Right   . CHOLECYSTECTOMY  1982  . COLONOSCOPY WITH PROPOFOL N/A 10/05/2017   Procedure: COLONOSCOPY WITH PROPOFOL;  Surgeon: Manya Silvas, MD;  Location: [facility] ENDOSCOPY;  Service: Endoscopy;  Laterality: N/A;  . DIAGNOSTIC LAPAROSCOPY    . JOINT REPLACEMENT Right 02/09/2017   TOTAL HIP REPLACEMENT, DR. CARTER. [place]  . KNEE ARTHROSCOPY  1990s  . PARTIAL MASTECTOMY WITH NEEDLE LOCALIZATION AND AXILLARY SENTINEL LYMPH NODE BX Right 03/28/2020   Procedure: PARTIAL MASTECTOMY WITH NEEDLE LOCALIZATION AND AXILLARY SENTINEL LYMPH NODE BX;  Surgeon: Herbert Pun, MD;  Location: [facility] ORS;  Service: General;  Laterality: Right;  . PORTACATH PLACEMENT Left 11/07/2019   Procedure: INSERTION PORT-A-CATH;  Surgeon: Herbert Pun, MD;  Location: [facility] ORS;  Service: General;  Laterality: Left;  . TOTAL HIP ARTHROPLASTY Left 06/23/2017   Procedure: TOTAL HIP ARTHROPLASTY ANTERIOR APPROACH;  Surgeon: Hessie Knows, MD;  Location: [facility] ORS;  Service: Orthopedics;  Laterality: Left;     Social History   Socioeconomic History  . Marital status: Married    Spouse name: Not on file  . Number of children: 2  . Years of education: some college  . Highest education level: Not on file  Occupational History  . Occupation: retired    Comment: [employer]  Tobacco Use  . Smoking status: Never Smoker  . Smokeless tobacco: Never Used  Vaping Use  . Vaping Use: Never used  Substance and Sexual Activity  . Alcohol use: No  . Drug use: No  . Sexual activity: Not Currently  Other Topics Concern  . Not on file  Social History Narrative  . Not on file   Social Determinants of Health   Financial Resource Strain:   . Difficulty of Paying Living Expenses: Not on file  Food Insecurity:   . Worried About Charity fundraiser in the Last Year: Not on file  . Ran Out of Food in the Last Year: Not on file  Transportation Needs:   . Lack of Transportation (Medical): Not on file  . Lack of Transportation (Non-Medical): Not on file  Physical Activity:   . Days of Exercise per Week: Not on file  . Minutes of Exercise per Session: Not on file  Stress:   . Feeling of Stress : Not on file  Social Connections:   .  Frequency of Communication with Friends and Family: Not on file  . Frequency of Social Gatherings with Friends and Family: Not on file  . Attends Religious Services: Not on file  . Active Member of Clubs or Organizations: Not on file  . Attends Archivist Meetings: Not on file  . Marital Status: Not on file  Intimate Partner Violence:   . Fear of Current or Ex-Partner: Not on file  . Emotionally Abused: Not on file  . Physically Abused: Not on file  . Sexually Abused: Not on file    Family History  Problem Relation Age of Onset  . Heart disease Mother   . Hypertension Mother   . Arrhythmia Mother   . Heart attack Mother   . Diabetes Mother   . Hypertension Father   . Parkinson's disease Father   . Coronary artery disease Maternal  Uncle   . Heart attack Maternal Uncle   . Diabetes Sister   . Diabetes Brother   . Heart disease Brother   . Breast cancer Neg Hx      Current Outpatient Medications:  .  acetaminophen (TYLENOL) 500 MG tablet, Take 500 mg by mouth every 6 (six) hours as needed., Disp: , Rfl:  .  albuterol (PROVENTIL HFA;VENTOLIN HFA) 108 (90 Base) MCG/ACT inhaler, Inhale 2 puffs into the lungs every 6 (six) hours as needed for wheezing or shortness of breath. , Disp: , Rfl:  .  carvedilol (COREG) 25 MG tablet, Take 25 mg by mouth 2 (two) times daily with a meal. , Disp: , Rfl:  .  cholecalciferol (VITAMIN D) 1000 units tablet, Take 2,000 Units by mouth daily. , Disp: , Rfl:  .  diphenoxylate-atropine (LOMOTIL) 2.5-0.025 MG tablet, Take 1 tablet by mouth 4 (four) times daily as needed for diarrhea or loose stools., Disp: 60 tablet, Rfl: 0 .  Fluticasone-Salmeterol (ADVAIR) 250-50 MCG/DOSE AEPB, Inhale 1 puff into the lungs 2 (two) times daily. , Disp: , Rfl:  .  letrozole (FEMARA) 2.5 MG tablet, Take 1 tablet (2.5 mg total) by mouth daily., Disp: 90 tablet, Rfl: 2 .  lidocaine-prilocaine (EMLA) cream, Apply 1 application topically as needed., Disp: 30 g, Rfl: 0 .  loratadine (CLARITIN) 10 MG tablet, Take 10 mg by mouth daily as needed for allergies. , Disp: , Rfl:  .  metFORMIN (GLUCOPHAGE-XR) 500 MG 24 hr tablet, Take 500 mg by mouth daily with supper. , Disp: , Rfl:  .  nystatin (MYCOSTATIN/NYSTOP) powder, Apply 1 application topically 3 (three) times daily., Disp: 60 g, Rfl: 2 .  solifenacin (VESICARE) 5 MG tablet, Take 10 mg by mouth every evening. , Disp: , Rfl:  .  telmisartan-hydrochlorothiazide (MICARDIS HCT) 80-25 MG tablet, Take 1 tablet by mouth every morning. , Disp: , Rfl:  .  loperamide (IMODIUM) 2 MG capsule, Take 2 mg by mouth as needed for diarrhea or loose stools. (Patient not taking: Reported on 07/19/2020), Disp: , Rfl:  .  pantoprazole (PROTONIX) 20 MG tablet, Take 1 tablet (20 mg total) by  mouth 2 (two) times daily before a meal. (Patient not taking: Reported on 07/19/2020), Disp: 60 tablet, Rfl: 2 .  potassium chloride (KLOR-CON) 20 MEQ packet, Take 20 mEq by mouth daily. (Patient not taking: Reported on 08/29/2020), Disp: 90 packet, Rfl: 0 .  traMADol (ULTRAM) 50 MG tablet, Take 1 tablet (50 mg total) by mouth every 6 (six) hours as needed. (Patient not taking: Reported on 08/29/2020), Disp: 30 tablet, Rfl: 0 No current  facility-administered medications for this visit.  Facility-Administered Medications Ordered in Other Visits:  .  sodium chloride flush (NS) 0.9 % injection 10 mL, 10 mL, Intravenous, PRN, Sindy Guadeloupe, MD, 10 mL at 01/05/20 1344 .  sodium chloride flush (NS) 0.9 % injection 10 mL, 10 mL, Intravenous, PRN, Sindy Guadeloupe, MD, 10 mL at 05/17/20 0858  Physical exam:  Vitals:   10/11/20 0849  BP: (!) 113/59  Pulse: 97  Resp: 16  Temp: (!) 96.2 F (35.7 C)  TempSrc: Tympanic  SpO2: 100%  Weight: 185 lb 4.8 oz (84.1 kg)   Physical Exam Constitutional:      General: She is not in acute distress. Cardiovascular:     Rate and Rhythm: Normal rate and regular rhythm.     Heart sounds: Normal heart sounds.  Pulmonary:     Effort: Pulmonary effort is normal.     Breath sounds: Normal breath sounds.  Abdominal:     General: Bowel sounds are normal.     Palpations: Abdomen is soft.  Skin:    General: Skin is warm and dry.  Neurological:     Mental Status: She is alert and oriented to person, place, and time.      CMP Latest Ref Rng & Units 10/11/2020  Glucose 70 - 99 mg/dL 114(H)  BUN 8 - 23 mg/dL 53(H)  Creatinine 0.44 - 1.00 mg/dL 1.72(H)  Sodium 135 - 145 mmol/L 139  Potassium 3.5 - 5.1 mmol/L 4.8  Chloride 98 - 111 mmol/L 102  CO2 22 - 32 mmol/L 28  Calcium 8.9 - 10.3 mg/dL 9.6  Total Protein 6.5 - 8.1 g/dL 6.8  Total Bilirubin 0.3 - 1.2 mg/dL 0.5  Alkaline Phos 38 - 126 U/L 79  AST 15 - 41 U/L 19  ALT 0 - 44 U/L 12   CBC Latest Ref Rng &  Units 10/11/2020  WBC 4.0 - 10.5 K/uL 4.0  Hemoglobin 12.0 - 15.0 g/dL 10.1(L)  Hematocrit 36 - 46 % 32.2(L)  Platelets 150 - 400 K/uL 133(L)    No images are attached to the encounter.  US RENAL  Result Date: 09/26/2020 CLINICAL DATA:  Initial evaluation for abnormal serum lipase. EXAM: RENAL / URINARY TRACT ULTRASOUND COMPLETE COMPARISON:  None. FINDINGS: Right Kidney: Renal measurements: 9.5 x 5.5 x 5.2 cm = volume: 140.4 mL. Increased echogenicity within the renal parenchyma. Kidney demonstrates a mildly lobulated contour. No nephrolithiasis or hydronephrosis. No focal renal mass. Left Kidney: Renal measurements: 9.5 x 5.7 x 4.8 cm = volume: 135.1 mL. Increased echogenicity seen within the renal parenchyma. Mild lobulated contour. No nephrolithiasis or hydronephrosis. No focal renal mass. Bladder: Appears normal for degree of bladder distention. Other: None. IMPRESSION: 1. Increased echogenicity within the renal parenchyma, consistent with medical renal disease. 2. No hydronephrosis or other significant finding. Electronically Signed   By: Jeannine Boga M.D.   On: 09/26/2020 01:24   MM DIAG BREAST TOMO BILATERAL  Result Date: 09/24/2020 CLINICAL DATA:  History of right breast cancer status post lumpectomy in May of 2021. EXAM: DIGITAL DIAGNOSTIC BILATERAL MAMMOGRAM WITH TOMO AND CAD COMPARISON:  Previous exam(s). ACR Breast Density Category b: There are scattered areas of fibroglandular density. FINDINGS: Lumpectomy changes are seen in the upper outer quadrant of the right breast. In the lumpectomy bed there are indeterminate grouped punctate calcifications some of which are in a linear distribution spanning an area of 2 mm. No additional abnormality seen in the right breast. No suspicious mass or  malignant type microcalcifications identified in the left breast. Mammographic images were processed with CAD. IMPRESSION: Indeterminate calcifications in the lumpectomy site of the right breast.  RECOMMENDATION: Stereotactic biopsy of the calcifications in the right breast is recommended. I have discussed the findings and recommendations with the patient. If applicable, a reminder letter will be sent to the patient regarding the next appointment. BI-RADS CATEGORY  4: Suspicious. Electronically Signed   By: Lillia Mountain M.D.   On: 09/24/2020 15:37   MM CLIP PLACEMENT RIGHT  Result Date: 10/02/2020 CLINICAL DATA:  Patient status post stereotactic guided core needle biopsy right breast calcifications. EXAM: DIAGNOSTIC RIGHT MAMMOGRAM POST STEREOTACTIC BIOPSY COMPARISON:  Previous exam(s). FINDINGS: Mammographic images were obtained following stereotactic guided biopsy of right breast calcifications. The biopsy marking clip is in expected position at the site of biopsy. IMPRESSION: Appropriate positioning of the X shaped biopsy marking clip at the site of biopsy in the outer right breast. Final Assessment: Post Procedure Mammograms for Marker Placement Electronically Signed   By: Lovey Newcomer M.D.   On: 10/02/2020 10:03   MM RT BREAST BX W LOC DEV 1ST LESION IMAGE BX SPEC STEREO GUIDE  Addendum Date: 10/04/2020   ADDENDUM REPORT: 10/04/2020 14:12 ADDENDUM: Pathology revealed PRIOR SURGICAL SITE CHANGE AND ORGANIZING FAT NECROSIS. NEGATIVE FOR ATYPIA AND MALIGNANCY of the RIGHT breast, lateral. This was found to be concordant by Dr. Lovey Newcomer. Pathology results were discussed with the patient by telephone. The patient reported doing well after the biopsy with tenderness at the site. Post biopsy instructions and care were reviewed and questions were answered. The patient was encouraged to call [facility] for any additional concerns. The patient was instructed to return for right diagnostic mammography and possible ultrasound in 6 months and informed a reminder notice would be sent regarding this appointment. Pathology results reported by Stacie Acres RN  on 10/04/2020. Electronically Signed   By: Lovey Newcomer M.D.   On: 10/04/2020 14:12   Result Date: 10/04/2020 CLINICAL DATA:  Patient with indeterminate right breast calcifications. EXAM: RIGHT BREAST STEREOTACTIC CORE NEEDLE BIOPSY COMPARISON:  Previous exams. FINDINGS: The patient and I discussed the procedure of stereotactic-guided biopsy including benefits and alternatives. We discussed the high likelihood of a successful procedure. We discussed the risks of the procedure including infection, bleeding, tissue injury, clip migration, and inadequate sampling. Informed written consent was given. The usual time out protocol was performed immediately prior to the procedure. Using sterile technique and 1% Lidocaine as local anesthetic, under stereotactic guidance, a 9 gauge vacuum assisted device was used to perform core needle biopsy of calcifications within the outer right breast using a lateral approach. Specimen radiograph was performed showing calcifications. Specimens with calcifications are identified for pathology. Lesion quadrant: Upper outer quadrant At the conclusion of the procedure, X shaped tissue marker clip was deployed into the biopsy cavity. Follow-up 2-view mammogram was performed and dictated separately. IMPRESSION: Stereotactic-guided biopsy of right breast calcifications. No apparent complications. Electronically Signed: By: Lovey Newcomer M.D. On: 10/02/2020 10:04     Assessment and plan- Patient is a 64 y.o. female withinvasive mammary carcinoma of the right breast clinical prognostic stage IamcT1 ccN0 cM0 ER/PR positive HER-2/neu positive.She is s/p 6 cycles of neoadjuvant TCHP chemotherapy with residual 5 mm tumor. She had persistent cytopenias and thrombocytopenia with Kadcyla and was switched to single agent maintenance Herceptin  Counts okay to proceed with cycle 6 of maintenance Herceptin today.  She  will directly proceed with cycle 7 in 3 weeks and I will see her back in 6  weeks for cycle 8.  Patient will need an echocardiogram/MUGA before her visit in 6 weeks.  She will also continue taking letrozole calcium and vitamin D.   Visit Diagnosis 1. Encounter for monoclonal antibody treatment for malignancy   2. Invasive carcinoma of breast ([place])   3. High risk medication use      Dr. Randa Evens, MD, MPH [facility] [phone] 10/12/2020 9:39 AM

## 2020-10-31 ENCOUNTER — Ambulatory Visit
Admission: RE | Admit: 2020-10-31 | Discharge: 2020-10-31 | Disposition: A | Discharge status: Home / Self Care | Payer: Medicare Other | Source: Ambulatory Visit | Attending: Oncology | Admitting: Oncology

## 2020-10-31 ENCOUNTER — Other Ambulatory Visit: Payer: Self-pay

## 2020-10-31 DIAGNOSIS — Z79899 Other long term (current) drug therapy: Secondary | ICD-10-CM | POA: Insufficient documentation

## 2020-10-31 DIAGNOSIS — Z9189 Other specified personal risk factors, not elsewhere classified: Secondary | ICD-10-CM | POA: Insufficient documentation

## 2020-10-31 DIAGNOSIS — C50919 Malignant neoplasm of unspecified site of unspecified female breast: Secondary | ICD-10-CM | POA: Diagnosis present

## 2020-10-31 DIAGNOSIS — Z780 Asymptomatic menopausal state: Secondary | ICD-10-CM | POA: Diagnosis present

## 2020-10-31 MED ORDER — TECHNETIUM TC 99M-LABELED RED BLOOD CELLS IV KIT
20.0000 | PACK | Freq: Once | INTRAVENOUS | Status: AC | PRN
  Administered 2020-10-31: 21.96 via INTRAVENOUS

## 2020-11-01 ENCOUNTER — Inpatient Hospital Stay: Payer: Medicare Other | Attending: Oncology

## 2020-11-01 VITALS — BP 151/84 | HR 67 | Temp 95.8°F | Resp 20 | Wt 187.0 lb

## 2020-11-01 DIAGNOSIS — C50911 Malignant neoplasm of unspecified site of right female breast: Secondary | ICD-10-CM | POA: Insufficient documentation

## 2020-11-01 DIAGNOSIS — Z5112 Encounter for antineoplastic immunotherapy: Secondary | ICD-10-CM | POA: Insufficient documentation

## 2020-11-01 DIAGNOSIS — C50919 Malignant neoplasm of unspecified site of unspecified female breast: Secondary | ICD-10-CM

## 2020-11-01 DIAGNOSIS — Z170 Estrogen receptor positive status [ER+]: Secondary | ICD-10-CM | POA: Diagnosis not present

## 2020-11-01 MED ORDER — ACETAMINOPHEN 325 MG PO TABS
650.0000 mg | ORAL_TABLET | Freq: Once | ORAL | Status: AC
  Administered 2020-11-01: 650 mg via ORAL
  Filled 2020-11-01: qty 2

## 2020-11-01 MED ORDER — HEPARIN SOD (PORK) LOCK FLUSH 100 UNIT/ML IV SOLN
500.0000 [IU] | Freq: Once | INTRAVENOUS | Status: AC | PRN
  Administered 2020-11-01: 500 [IU]
  Filled 2020-11-01: qty 5

## 2020-11-01 MED ORDER — SODIUM CHLORIDE 0.9 % IV SOLN
Freq: Once | INTRAVENOUS | Status: AC
  Filled 2020-11-01: qty 250

## 2020-11-01 MED ORDER — TRASTUZUMAB-DKST CHEMO 150 MG IV SOLR
6.0000 mg/kg | Freq: Once | INTRAVENOUS | Status: AC
  Administered 2020-11-01: 504 mg via INTRAVENOUS
  Filled 2020-11-01: qty 24

## 2020-11-01 MED ORDER — DIPHENHYDRAMINE HCL 25 MG PO CAPS
25.0000 mg | ORAL_CAPSULE | Freq: Once | ORAL | Status: AC
  Administered 2020-11-01: 25 mg via ORAL
  Filled 2020-11-01: qty 1

## 2020-11-01 MED ORDER — SODIUM CHLORIDE 0.9% FLUSH
10.0000 mL | INTRAVENOUS | Status: DC | PRN
  Administered 2020-11-01: 10 mL
  Filled 2020-11-01: qty 10

## 2020-11-01 MED ORDER — HEPARIN SOD (PORK) LOCK FLUSH 100 UNIT/ML IV SOLN
INTRAVENOUS | Status: AC
  Filled 2020-11-01: qty 5

## 2020-11-01 NOTE — Progress Notes (Signed)
Stable at discharge 

## 2020-11-02 ENCOUNTER — Encounter: Payer: Self-pay | Admitting: Oncology

## 2020-11-22 ENCOUNTER — Inpatient Hospital Stay: Payer: Medicare Other

## 2020-11-22 ENCOUNTER — Other Ambulatory Visit: Payer: Self-pay

## 2020-11-22 ENCOUNTER — Inpatient Hospital Stay (HOSPITAL_BASED_OUTPATIENT_CLINIC_OR_DEPARTMENT_OTHER): Payer: Medicare Other | Admitting: Oncology

## 2020-11-22 VITALS — BP 137/43 | HR 64 | Temp 97.7°F | Wt 189.8 lb

## 2020-11-22 DIAGNOSIS — T451X5A Adverse effect of antineoplastic and immunosuppressive drugs, initial encounter: Secondary | ICD-10-CM | POA: Diagnosis not present

## 2020-11-22 DIAGNOSIS — C50919 Malignant neoplasm of unspecified site of unspecified female breast: Secondary | ICD-10-CM

## 2020-11-22 DIAGNOSIS — G620 Drug-induced polyneuropathy: Secondary | ICD-10-CM

## 2020-11-22 DIAGNOSIS — Z5112 Encounter for antineoplastic immunotherapy: Secondary | ICD-10-CM | POA: Diagnosis not present

## 2020-11-22 LAB — COMPREHENSIVE METABOLIC PANEL
ALT: 17 U/L (ref 0–44)
AST: 25 U/L (ref 15–41)
Albumin: 3.8 g/dL (ref 3.5–5.0)
Alkaline Phosphatase: 80 U/L (ref 38–126)
Anion gap: 7 (ref 5–15)
BUN: 43 mg/dL — ABNORMAL HIGH (ref 8–23)
CO2: 28 mmol/L (ref 22–32)
Calcium: 9.2 mg/dL (ref 8.9–10.3)
Chloride: 105 mmol/L (ref 98–111)
Creatinine, Ser: 1.55 mg/dL — ABNORMAL HIGH (ref 0.44–1.00)
GFR, Estimated: 37 mL/min — ABNORMAL LOW (ref >60)
Glucose, Bld: 116 mg/dL — ABNORMAL HIGH (ref 70–99)
Potassium: 4.3 mmol/L (ref 3.5–5.1)
Sodium: 140 mmol/L (ref 135–145)
Total Bilirubin: 0.5 mg/dL (ref 0.3–1.2)
Total Protein: 7 g/dL (ref 6.5–8.1)

## 2020-11-22 LAB — CBC WITH DIFFERENTIAL/PLATELET
Abs Immature Granulocytes: 0.02 10*3/uL (ref 0.00–0.07)
Basophils Absolute: 0.1 10*3/uL (ref 0.0–0.1)
Basophils Relative: 2 %
Eosinophils Absolute: 0.1 10*3/uL (ref 0.0–0.5)
Eosinophils Relative: 2 %
HCT: 31.1 % — ABNORMAL LOW (ref 36.0–46.0)
Hemoglobin: 9.5 g/dL — ABNORMAL LOW (ref 12.0–15.0)
Immature Granulocytes: 0 %
Lymphocytes Relative: 36 %
Lymphs Abs: 1.7 10*3/uL (ref 0.7–4.0)
MCH: 26 pg (ref 26.0–34.0)
MCHC: 30.5 g/dL (ref 30.0–36.0)
MCV: 85.2 fL (ref 80.0–100.0)
Monocytes Absolute: 0.5 10*3/uL (ref 0.1–1.0)
Monocytes Relative: 11 %
Neutro Abs: 2.3 10*3/uL (ref 1.7–7.7)
Neutrophils Relative %: 49 %
Platelets: 133 10*3/uL — ABNORMAL LOW (ref 150–400)
RBC: 3.65 MIL/uL — ABNORMAL LOW (ref 3.87–5.11)
RDW: 16.6 % — ABNORMAL HIGH (ref 11.5–15.5)
WBC: 4.6 10*3/uL (ref 4.0–10.5)
nRBC: 0 % (ref 0.0–0.2)

## 2020-11-22 MED ORDER — DIPHENHYDRAMINE HCL 25 MG PO CAPS
25.0000 mg | ORAL_CAPSULE | Freq: Once | ORAL | Status: AC
  Administered 2020-11-22: 10:00:00 25 mg via ORAL
  Filled 2020-11-22: qty 1

## 2020-11-22 MED ORDER — TRASTUZUMAB-DKST CHEMO 150 MG IV SOLR
6.0000 mg/kg | Freq: Once | INTRAVENOUS | Status: AC
  Administered 2020-11-22: 11:00:00 504 mg via INTRAVENOUS
  Filled 2020-11-22: qty 24

## 2020-11-22 MED ORDER — HEPARIN SOD (PORK) LOCK FLUSH 100 UNIT/ML IV SOLN
INTRAVENOUS | Status: AC
  Filled 2020-11-22: qty 5

## 2020-11-22 MED ORDER — SODIUM CHLORIDE 0.9% FLUSH
10.0000 mL | Freq: Once | INTRAVENOUS | Status: AC
  Administered 2020-11-22: 09:00:00 10 mL via INTRAVENOUS
  Filled 2020-11-22: qty 10

## 2020-11-22 MED ORDER — SODIUM CHLORIDE 0.9 % IV SOLN
Freq: Once | INTRAVENOUS | Status: AC
  Filled 2020-11-22: qty 250

## 2020-11-22 MED ORDER — HEPARIN SOD (PORK) LOCK FLUSH 100 UNIT/ML IV SOLN
500.0000 [IU] | Freq: Once | INTRAVENOUS | Status: AC | PRN
  Administered 2020-11-22: 11:00:00 500 [IU]
  Filled 2020-11-22: qty 5

## 2020-11-22 MED ORDER — ACETAMINOPHEN 325 MG PO TABS
650.0000 mg | ORAL_TABLET | Freq: Once | ORAL | Status: AC
  Administered 2020-11-22: 10:00:00 650 mg via ORAL
  Filled 2020-11-22: qty 2

## 2020-11-22 NOTE — Progress Notes (Signed)
Pt tolerated infusion well with no complications. VSS. Pt stable for discharge.   Christina Mcclain  

## 2020-11-22 NOTE — Progress Notes (Signed)
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 It showed 2 suspicious lesions in the right breast. Diagnostic mammogram and ultrasound confirmed 2 masses in the 9 o'clock position of the right breast measuring 1.1 x 1.2 x 1.2 cm and the other one measuring 0.8 x 0.8 x 0.9 cm. These 2 masses were 1.7 cm apart. Sonographic evaluation of the right axilla did not show any enlarged adenopathy. 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  Baseline MUGA scan showed EF of 58%. MRI showed two distinct masses in the right breast 1.5 cm and 1.2 cm 2 cm apart no additional suspicious masses or abnormal enhancement identified. No suspicious adenopathy  6 cycles of neoadjuvant TCHP chemotherapy completed on 02/29/2020.Final pathology showed residual tumor of 5 mm.Negative margins. 1 sentinel lymph node negative for malignancy. Overall cancer cellularity 1%. Percentage of cancer that is in situ 0%.  [id number]  Patient is started on adjuvant Kadcyla on 04/26/2020.Treatment complicated by thrombocytopenia despite dose reduction.Patient switched to adjuvant Herceptin. Perjeta not continued due to significant diarrhea in the neoadjuvant setting   Interval history- she is still struggling with muscle cramps which come on suddenly. She has been keeping herself hydrated tries to be active as much as possible but that has not helped. Diarrhea is intermittent and self limited. Reports tingling numbness in her hands and feet which at times makes it difficult for her to grab on to things.  ECOG PS- 1 Pain scale- 3   Review of systems- Review of Systems  Constitutional: Positive for malaise/fatigue. Negative for chills, fever and weight loss.  HENT: Negative for congestion, ear discharge and nosebleeds.   Eyes: Negative for blurred vision.  Respiratory: Negative for cough, hemoptysis, sputum production, shortness of breath and wheezing.   Cardiovascular: Negative for chest pain, palpitations, orthopnea and claudication.  Gastrointestinal: Negative for abdominal pain, blood in stool, constipation, diarrhea, heartburn, melena, nausea and vomiting.  Genitourinary: Negative for dysuria, flank pain, frequency, hematuria and urgency.  Musculoskeletal: Negative for back pain, joint pain and myalgias.       Leg cramps  Skin: Negative for rash.  Neurological: Positive for sensory change (peripheral neuropathy). Negative for dizziness, tingling, focal weakness, seizures, weakness and headaches.  Endo/Heme/Allergies: Does not bruise/bleed easily.  Psychiatric/Behavioral: Negative for depression and suicidal ideas. The patient does not have insomnia.       Allergies  Allergen Reactions  . Amlodipine Other (See Comments)    edema  . Statins Rash    Other reaction(s): Muscle Pain Possible hives also     Past Medical History:  Diagnosis Date  . Adverse effect of anesthesia  hard to awaken   . Anemia   . Arthritis   . Asthma   . Asthma without status asthmaticus    unspecified; Take advair each night  . Breast cancer ([place])   . Bronchitis   . Constipation   . Diabetes ([place]) 2010  . Diabetes mellitus type 2, uncomplicated ([place])   . Diabetes mellitus without complication ([place])   . Encounter for blood transfusion    hysterectomy 9-10 years ago  . Heart murmur    unspecified  . Hyperlipidemia   . Hypertension 1991  . Hypothyroid   . Multiple thyroid nodules   . Neuromuscular disorder ([facility])    neuropathy  . Osteoarthritis of right hip 02/04/2017  . Palpitations   . Pernicious anemia   . Personal history of radiation therapy   . Pneumonia   . Sleep apnea    Use C-PAP; can't use mouth piece. Anxiety  . Thyroid disease    nodules  . Thyroid nodule    x2  . Urinary urgency      Past Surgical History:  Procedure Laterality Date  . ABDOMINAL HYSTERECTOMY  2011   partial  . ABDOMINAL SURGERY    . BREAST BIOPSY Right 10/02/2020   affirm bx, x marker, path pending  . BREAST LUMPECTOMY Right    2021   . CARPAL TUNNEL RELEASE Left 2008  . CARPAL TUNNEL RELEASE Right   . CHOLECYSTECTOMY  1982  . COLONOSCOPY WITH PROPOFOL N/A 10/05/2017   Procedure: COLONOSCOPY WITH PROPOFOL;  Surgeon: Manya Silvas, MD;  Location: [facility] ENDOSCOPY;  Service: Endoscopy;  Laterality: N/A;  . DIAGNOSTIC LAPAROSCOPY    . JOINT REPLACEMENT Right 02/09/2017   TOTAL HIP REPLACEMENT, DR. CARTER. [place]  . KNEE ARTHROSCOPY  1990s  . PARTIAL MASTECTOMY WITH NEEDLE LOCALIZATION AND AXILLARY SENTINEL LYMPH NODE BX Right 03/28/2020   Procedure: PARTIAL MASTECTOMY WITH NEEDLE LOCALIZATION AND AXILLARY SENTINEL LYMPH NODE BX;  Surgeon: Herbert Pun, MD;  Location: [facility] ORS;  Service: General;  Laterality: Right;  . PORTACATH PLACEMENT Left 11/07/2019   Procedure: INSERTION PORT-A-CATH;  Surgeon: Herbert Pun, MD;  Location: [facility] ORS;  Service: General;  Laterality: Left;  .  TOTAL HIP ARTHROPLASTY Left 06/23/2017   Procedure: TOTAL HIP ARTHROPLASTY ANTERIOR APPROACH;  Surgeon: Hessie Knows, MD;  Location: [facility] ORS;  Service: Orthopedics;  Laterality: Left;    Social History   Socioeconomic History  . Marital status: Married    Spouse name: Not on file  . Number of children: 2  . Years of education: some college  . Highest education level: Not on file  Occupational History  . Occupation: retired    Comment: [employer]  Tobacco Use  . Smoking status: Never Smoker  . Smokeless tobacco: Never Used  Vaping Use  . Vaping Use: Never used  Substance and Sexual Activity  . Alcohol use: No  . Drug use: No  . Sexual activity: Not Currently  Other Topics Concern  . Not on file  Social History Narrative  . Not on file   Social Determinants of Health   Financial Resource Strain: Not on file  Food Insecurity: Not on file  Transportation Needs: Not on file  Physical Activity: Not on file  Stress: Not on file  Social Connections: Not on file  Intimate Partner Violence: Not on file    Family History  Problem Relation Age of Onset  . Heart disease Mother   . Hypertension Mother   . Arrhythmia Mother   .  Heart attack Mother   . Diabetes Mother   . Hypertension Father   . Parkinson's disease Father   . Coronary artery disease Maternal Uncle   . Heart attack Maternal Uncle   . Diabetes Sister   . Diabetes Brother   . Heart disease Brother   . Breast cancer Neg Hx      Current Outpatient Medications:  .  acetaminophen (TYLENOL) 500 MG tablet, Take 500 mg by mouth every 6 (six) hours as needed., Disp: , Rfl:  .  albuterol (PROVENTIL HFA;VENTOLIN HFA) 108 (90 Base) MCG/ACT inhaler, Inhale 2 puffs into the lungs every 6 (six) hours as needed for wheezing or shortness of breath. , Disp: , Rfl:  .  carvedilol (COREG) 25 MG tablet, Take 25 mg by mouth 2 (two) times daily with a meal. , Disp: , Rfl:  .  cholecalciferol (VITAMIN D) 1000  units tablet, Take 2,000 Units by mouth daily. , Disp: , Rfl:  .  diphenoxylate-atropine (LOMOTIL) 2.5-0.025 MG tablet, Take 1 tablet by mouth 4 (four) times daily as needed for diarrhea or loose stools., Disp: 60 tablet, Rfl: 0 .  Fluticasone-Salmeterol (ADVAIR) 250-50 MCG/DOSE AEPB, Inhale 1 puff into the lungs 2 (two) times daily. , Disp: , Rfl:  .  letrozole (FEMARA) 2.5 MG tablet, Take 1 tablet (2.5 mg total) by mouth daily., Disp: 90 tablet, Rfl: 2 .  lidocaine-prilocaine (EMLA) cream, Apply 1 application topically as needed., Disp: 30 g, Rfl: 0 .  loperamide (IMODIUM) 2 MG capsule, Take 2 mg by mouth as needed for diarrhea or loose stools. (Patient not taking: Reported on 07/19/2020), Disp: , Rfl:  .  loratadine (CLARITIN) 10 MG tablet, Take 10 mg by mouth daily as needed for allergies. , Disp: , Rfl:  .  metFORMIN (GLUCOPHAGE-XR) 500 MG 24 hr tablet, Take 500 mg by mouth daily with supper. , Disp: , Rfl:  .  nystatin (MYCOSTATIN/NYSTOP) powder, Apply 1 application topically 3 (three) times daily., Disp: 60 g, Rfl: 2 .  pantoprazole (PROTONIX) 20 MG tablet, Take 1 tablet (20 mg total) by mouth 2 (two) times daily before a meal. (Patient not taking: Reported on 07/19/2020), Disp: 60 tablet, Rfl: 2 .  potassium chloride (KLOR-CON) 20 MEQ packet, Take 20 mEq by mouth daily. (Patient not taking: Reported on 08/29/2020), Disp: 90 packet, Rfl: 0 .  solifenacin (VESICARE) 5 MG tablet, Take 10 mg by mouth every evening. , Disp: , Rfl:  .  telmisartan-hydrochlorothiazide (MICARDIS HCT) 80-25 MG tablet, Take 1 tablet by mouth every morning. , Disp: , Rfl:  .  traMADol (ULTRAM) 50 MG tablet, Take 1 tablet (50 mg total) by mouth every 6 (six) hours as needed. (Patient not taking: Reported on 08/29/2020), Disp: 30 tablet, Rfl: 0 No current facility-administered medications for this visit.  Facility-Administered Medications Ordered in Other Visits:  .  sodium chloride flush (NS) 0.9 % injection 10 mL, 10 mL,  Intravenous, PRN, Sindy Guadeloupe, MD, 10 mL at 01/05/20 1344 .  sodium chloride flush (NS) 0.9 % injection 10 mL, 10 mL, Intravenous, PRN, Sindy Guadeloupe, MD, 10 mL at 05/17/20 0858  Physical exam:  Vitals:   11/22/20 0853  BP: (!) 137/43  Pulse: 64  Temp: 97.7 F (36.5 C)  TempSrc: Tympanic  SpO2: 98%  Weight: 189 lb 12.8 oz (86.1 kg)   Physical Exam HENT:     Head: Normocephalic and atraumatic.  Eyes:     Extraocular Movements: EOM normal.  Pupils: Pupils are equal, round, and reactive to light.  Cardiovascular:     Rate and Rhythm: Normal rate and regular rhythm.     Heart sounds: Normal heart sounds.  Pulmonary:     Effort: Pulmonary effort is normal.     Breath sounds: Normal breath sounds.  Abdominal:     General: Bowel sounds are normal.     Palpations: Abdomen is soft.  Musculoskeletal:     Cervical back: Normal range of motion.  Skin:    General: Skin is warm and dry.  Neurological:     Mental Status: She is alert and oriented to person, place, and time.      CMP Latest Ref Rng & Units 10/11/2020  Glucose 70 - 99 mg/dL 114(H)  BUN 8 - 23 mg/dL 53(H)  Creatinine 0.44 - 1.00 mg/dL 1.72(H)  Sodium 135 - 145 mmol/L 139  Potassium 3.5 - 5.1 mmol/L 4.8  Chloride 98 - 111 mmol/L 102  CO2 22 - 32 mmol/L 28  Calcium 8.9 - 10.3 mg/dL 9.6  Total Protein 6.5 - 8.1 g/dL 6.8  Total Bilirubin 0.3 - 1.2 mg/dL 0.5  Alkaline Phos 38 - 126 U/L 79  AST 15 - 41 U/L 19  ALT 0 - 44 U/L 12   CBC Latest Ref Rng & Units 10/11/2020  WBC 4.0 - 10.5 K/uL 4.0  Hemoglobin 12.0 - 15.0 g/dL 10.1(L)  Hematocrit 36.0 - 46.0 % 32.2(L)  Platelets 150 - 400 K/uL 133(L)    No images are attached to the encounter.  NM Cardiac Muga Rest  Result Date: 10/31/2020 CLINICAL DATA:  Breast cancer, cardiotoxic chemotherapy, high risk medication use EXAM: NUCLEAR MEDICINE CARDIAC BLOOD POOL IMAGING (MUGA) TECHNIQUE: Cardiac multi-gated acquisition was performed at rest following  intravenous injection of Tc-49mlabeled red blood cells. RADIOPHARMACEUTICALS:  21.960 mCi Tc-924mertechnetate in-vitro labeled red blood cells IV COMPARISON:  08/23/2020 FINDINGS: Calculated LEFT ventricular ejection fraction is 67.5%, normal%, increased from 61.5% on previous exam. Study was obtained at a cardiac rate of 58 bpm. Patient was rhythmic during imaging. Cine analysis of the LEFT ventricle in 3 projections demonstrates normal LV wall motion. IMPRESSION: Normal LEFT ventricular ejection fraction of 67.5% increased from the 61.5% calculated on 08/23/2020. Normal LV wall motion. Electronically Signed   By: MaLavonia Dana.D.   On: 10/31/2020 16:28     Assessment and plan- Patient is a [age].o. female withinvasive mammary carcinoma of the right breast clinical prognostic stage IamcT1 ccN0 cM0 ER/PR positive HER-2/neu positive.She is s/p 6 cycles of neoadjuvant TCHP chemotherapy with residual 5 mm tumor. She had persistent cytopenias and thrombocytopenia with Kadcyla and was switched to single agent maintenance Herceptin  Patient is here for next cycle of maintenance Herceptin  Counts okay to proceed with cycle 8 of maintenance Herceptin today.  She will proceed with cycle 9 in 3 weeks and I will see her back in 6 weeks with CBC with differential, CMP for cycle 10.She did have a repeat echocardiogram on 10/31/2020 which showed an EF of 67.5% increase from prior value of 61.5%.  Chemo-induced peripheral neuropathy: She is not on any medications presently and also went through acupuncture.  Stable continue to monitor  Patient is on letrozole for ER positive disease which she is tolerating well without any significant side effects.  Awaiting dental clearance to start zometa  Muscle cramping etiology unclear. B complex has not helped. Other options to consider are gabapentin and benadryl QHS   Visit Diagnosis  1. Encounter for monoclonal antibody treatment for malignancy   2. Invasive  carcinoma of breast ([place])      Dr. Randa Evens, MD, MPH [facility] [phone] 11/22/2020

## 2020-11-25 ENCOUNTER — Encounter: Payer: Self-pay | Admitting: Oncology

## 2020-12-13 ENCOUNTER — Inpatient Hospital Stay: Payer: Medicare Other | Attending: Oncology

## 2020-12-13 ENCOUNTER — Ambulatory Visit: Payer: Medicare Other | Admitting: Oncology

## 2020-12-13 VITALS — BP 130/50 | HR 57 | Temp 96.0°F | Resp 18 | Wt 186.7 lb

## 2020-12-13 DIAGNOSIS — Z5112 Encounter for antineoplastic immunotherapy: Secondary | ICD-10-CM | POA: Insufficient documentation

## 2020-12-13 DIAGNOSIS — C50911 Malignant neoplasm of unspecified site of right female breast: Secondary | ICD-10-CM | POA: Diagnosis present

## 2020-12-13 DIAGNOSIS — Z170 Estrogen receptor positive status [ER+]: Secondary | ICD-10-CM | POA: Insufficient documentation

## 2020-12-13 DIAGNOSIS — C50919 Malignant neoplasm of unspecified site of unspecified female breast: Secondary | ICD-10-CM

## 2020-12-13 MED ORDER — SODIUM CHLORIDE 0.9 % IV SOLN
Freq: Once | INTRAVENOUS | Status: AC
  Filled 2020-12-13: qty 250

## 2020-12-13 MED ORDER — ACETAMINOPHEN 325 MG PO TABS
650.0000 mg | ORAL_TABLET | Freq: Once | ORAL | Status: AC
  Administered 2020-12-13: 650 mg via ORAL
  Filled 2020-12-13: qty 2

## 2020-12-13 MED ORDER — HEPARIN SOD (PORK) LOCK FLUSH 100 UNIT/ML IV SOLN
500.0000 [IU] | Freq: Once | INTRAVENOUS | Status: AC | PRN
  Administered 2020-12-13: 500 [IU]
  Filled 2020-12-13: qty 5

## 2020-12-13 MED ORDER — HEPARIN SOD (PORK) LOCK FLUSH 100 UNIT/ML IV SOLN
INTRAVENOUS | Status: AC
  Filled 2020-12-13: qty 5

## 2020-12-13 MED ORDER — DIPHENHYDRAMINE HCL 25 MG PO CAPS
25.0000 mg | ORAL_CAPSULE | Freq: Once | ORAL | Status: AC
  Administered 2020-12-13: 25 mg via ORAL
  Filled 2020-12-13: qty 1

## 2020-12-13 MED ORDER — TRASTUZUMAB-DKST CHEMO 150 MG IV SOLR
6.0000 mg/kg | Freq: Once | INTRAVENOUS | Status: AC
  Administered 2020-12-13: 504 mg via INTRAVENOUS
  Filled 2020-12-13: qty 24

## 2020-12-13 NOTE — Progress Notes (Signed)
Pt tolerated treatment well with no signs of complications. VSS. Pt stable for discharge.   Angad Nabers  

## 2020-12-26 ENCOUNTER — Encounter: Payer: Self-pay | Admitting: Radiation Oncology

## 2020-12-26 ENCOUNTER — Other Ambulatory Visit: Payer: Self-pay

## 2020-12-26 ENCOUNTER — Ambulatory Visit
Admission: RE | Admit: 2020-12-26 | Discharge: 2020-12-26 | Disposition: A | Discharge status: Home / Self Care | Payer: Medicare Other | Source: Ambulatory Visit | Attending: Radiation Oncology | Admitting: Radiation Oncology

## 2020-12-26 VITALS — BP 126/58 | HR 58 | Temp 96.4°F | Wt 188.8 lb

## 2020-12-26 DIAGNOSIS — Z170 Estrogen receptor positive status [ER+]: Secondary | ICD-10-CM

## 2020-12-26 NOTE — Progress Notes (Signed)
Radiation Oncology Follow up Note  Name: Christina Mcclain   Date:   12/26/2020 MRN:  [id number] DOB: November 16, 1956    This 65 y.o. female presents to the clinic today for 22-month follow-up status post whole breast radiation to right breast for triple positive invasive mammary carcinoma.  REFERRING PROVIDER: Kirk Ruths, MD  HPI: Patient is a 65 year old female now out 6 months having completed whole breast radiation to her right breast for triple positive invasive mammary carcinoma.  She underwent neoadjuvant chemotherapy followed by wide local excision.  She did develop some calcific microcalcifications in the lumpectomy site which were biopsied and were benign showing organizing fat necrosis negative for atypia or malignancy.  She is currently on maintenance Herceptin tolerating that well.  She has some peripheral neuropathy.  Patient is also on letrozole.  COMPLICATIONS OF TREATMENT: none  FOLLOW UP COMPLIANCE: keeps appointments   PHYSICAL EXAM:  BP (!) 126/58   Pulse (!) 58   Temp (!) 96.4 F (35.8 C) (Tympanic)   Wt 188 lb 12.8 oz (85.6 kg)   BMI 34.53 kg/m  Lungs are clear to A&P cardiac examination essentially unremarkable with regular rate and rhythm. No dominant mass or nodularity is noted in either breast in 2 positions examined. Incision is well-healed. No axillary or supraclavicular adenopathy is appreciated. Cosmetic result is excellent.  Well-developed well-nourished patient in NAD. HEENT reveals PERLA, EOMI, discs not visualized.  Oral cavity is clear. No oral mucosal lesions are identified. Neck is clear without evidence of cervical or supraclavicular adenopathy. Lungs are clear to A&P. Cardiac examination is essentially unremarkable with regular rate and rhythm without murmur rub or thrill. Abdomen is benign with no organomegaly or masses noted. Motor sensory and DTR levels are equal and symmetric in the upper and lower extremities. Cranial nerves II through XII are  grossly intact. Proprioception is intact. No peripheral adenopathy or edema is identified. No motor or sensory levels are noted. Crude visual fields are within normal range.  RADIOLOGY RESULTS: Mammograms reviewed as well as pathology reports compatible with above-stated findings  PLAN: Present time patient is doing well continues on maintenance Herceptin.  For breast standpoint she is doing well with no evidence of disease.  I have asked to see her back in 6 months for follow-up.  She continues on letrozole.  Patient knows to call with any concerns.  I would like to take this opportunity to thank you for allowing me to participate in the care of your patient.Noreene Filbert, MD

## 2021-01-03 ENCOUNTER — Inpatient Hospital Stay: Payer: Medicare Other

## 2021-01-03 ENCOUNTER — Other Ambulatory Visit: Payer: Self-pay | Admitting: *Deleted

## 2021-01-03 ENCOUNTER — Inpatient Hospital Stay: Payer: Medicare Other | Attending: Oncology | Admitting: Oncology

## 2021-01-03 ENCOUNTER — Encounter: Payer: Self-pay | Admitting: Oncology

## 2021-01-03 ENCOUNTER — Other Ambulatory Visit: Payer: Self-pay

## 2021-01-03 ENCOUNTER — Telehealth: Payer: Self-pay | Admitting: Oncology

## 2021-01-03 VITALS — BP 125/68 | HR 56 | Temp 97.6°F | Resp 16 | Wt 190.1 lb

## 2021-01-03 DIAGNOSIS — R6889 Other general symptoms and signs: Secondary | ICD-10-CM

## 2021-01-03 DIAGNOSIS — C50919 Malignant neoplasm of unspecified site of unspecified female breast: Secondary | ICD-10-CM

## 2021-01-03 DIAGNOSIS — R5383 Other fatigue: Secondary | ICD-10-CM | POA: Diagnosis not present

## 2021-01-03 DIAGNOSIS — C50911 Malignant neoplasm of unspecified site of right female breast: Secondary | ICD-10-CM | POA: Diagnosis present

## 2021-01-03 DIAGNOSIS — T451X5A Adverse effect of antineoplastic and immunosuppressive drugs, initial encounter: Secondary | ICD-10-CM

## 2021-01-03 DIAGNOSIS — Z170 Estrogen receptor positive status [ER+]: Secondary | ICD-10-CM | POA: Insufficient documentation

## 2021-01-03 DIAGNOSIS — Z5112 Encounter for antineoplastic immunotherapy: Secondary | ICD-10-CM

## 2021-01-03 DIAGNOSIS — G620 Drug-induced polyneuropathy: Secondary | ICD-10-CM

## 2021-01-03 DIAGNOSIS — Z79899 Other long term (current) drug therapy: Secondary | ICD-10-CM | POA: Diagnosis not present

## 2021-01-03 LAB — CBC WITH DIFFERENTIAL/PLATELET
Abs Immature Granulocytes: 0.01 10*3/uL (ref 0.00–0.07)
Basophils Absolute: 0.1 10*3/uL (ref 0.0–0.1)
Basophils Relative: 1 %
Eosinophils Absolute: 0.1 10*3/uL (ref 0.0–0.5)
Eosinophils Relative: 3 %
HCT: 32 % — ABNORMAL LOW (ref 36.0–46.0)
Hemoglobin: 9.9 g/dL — ABNORMAL LOW (ref 12.0–15.0)
Immature Granulocytes: 0 %
Lymphocytes Relative: 41 %
Lymphs Abs: 1.8 10*3/uL (ref 0.7–4.0)
MCH: 26.5 pg (ref 26.0–34.0)
MCHC: 30.9 g/dL (ref 30.0–36.0)
MCV: 85.8 fL (ref 80.0–100.0)
Monocytes Absolute: 0.4 10*3/uL (ref 0.1–1.0)
Monocytes Relative: 10 %
Neutro Abs: 1.9 10*3/uL (ref 1.7–7.7)
Neutrophils Relative %: 45 %
Platelets: 135 10*3/uL — ABNORMAL LOW (ref 150–400)
RBC: 3.73 MIL/uL — ABNORMAL LOW (ref 3.87–5.11)
RDW: 15.7 % — ABNORMAL HIGH (ref 11.5–15.5)
WBC: 4.3 10*3/uL (ref 4.0–10.5)
nRBC: 0 % (ref 0.0–0.2)

## 2021-01-03 LAB — COMPREHENSIVE METABOLIC PANEL
ALT: 13 U/L (ref 0–44)
AST: 20 U/L (ref 15–41)
Albumin: 3.8 g/dL (ref 3.5–5.0)
Alkaline Phosphatase: 89 U/L (ref 38–126)
Anion gap: 9 (ref 5–15)
BUN: 47 mg/dL — ABNORMAL HIGH (ref 8–23)
CO2: 28 mmol/L (ref 22–32)
Calcium: 9.4 mg/dL (ref 8.9–10.3)
Chloride: 104 mmol/L (ref 98–111)
Creatinine, Ser: 1.7 mg/dL — ABNORMAL HIGH (ref 0.44–1.00)
GFR, Estimated: 33 mL/min — ABNORMAL LOW (ref >60)
Glucose, Bld: 114 mg/dL — ABNORMAL HIGH (ref 70–99)
Potassium: 4.5 mmol/L (ref 3.5–5.1)
Sodium: 141 mmol/L (ref 135–145)
Total Bilirubin: 0.6 mg/dL (ref 0.3–1.2)
Total Protein: 6.8 g/dL (ref 6.5–8.1)

## 2021-01-03 LAB — TSH: TSH: 2.727 u[IU]/mL (ref 0.350–4.500)

## 2021-01-03 MED ORDER — DIPHENHYDRAMINE HCL 25 MG PO CAPS
25.0000 mg | ORAL_CAPSULE | Freq: Once | ORAL | Status: AC
  Administered 2021-01-03: 25 mg via ORAL
  Filled 2021-01-03: qty 1

## 2021-01-03 MED ORDER — SODIUM CHLORIDE 0.9 % IV SOLN
Freq: Once | INTRAVENOUS | Status: AC
  Filled 2021-01-03: qty 250

## 2021-01-03 MED ORDER — HEPARIN SOD (PORK) LOCK FLUSH 100 UNIT/ML IV SOLN
500.0000 [IU] | Freq: Once | INTRAVENOUS | Status: AC | PRN
  Administered 2021-01-03: 500 [IU]
  Filled 2021-01-03: qty 5

## 2021-01-03 MED ORDER — SODIUM CHLORIDE 0.9% FLUSH
10.0000 mL | INTRAVENOUS | Status: DC | PRN
  Administered 2021-01-03: 10 mL
  Filled 2021-01-03: qty 10

## 2021-01-03 MED ORDER — ACETAMINOPHEN 325 MG PO TABS
650.0000 mg | ORAL_TABLET | Freq: Once | ORAL | Status: AC
  Administered 2021-01-03: 650 mg via ORAL
  Filled 2021-01-03: qty 2

## 2021-01-03 MED ORDER — TRASTUZUMAB-DKST CHEMO 150 MG IV SOLR
6.0000 mg/kg | Freq: Once | INTRAVENOUS | Status: AC
  Administered 2021-01-03: 504 mg via INTRAVENOUS
  Filled 2021-01-03: qty 24

## 2021-01-03 MED ORDER — HEPARIN SOD (PORK) LOCK FLUSH 100 UNIT/ML IV SOLN
INTRAVENOUS | Status: AC
  Filled 2021-01-03: qty 5

## 2021-01-03 NOTE — Telephone Encounter (Signed)
Called pt to notify her of MUGA scan scheduled for 2/17 at 2pm. Pt confirmed.

## 2021-01-03 NOTE — Progress Notes (Signed)
Tolerated [place] well. Discharged home in stable condition.

## 2021-01-03 NOTE — Progress Notes (Signed)
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 It showed 2 suspicious lesions in the right breast. Diagnostic mammogram and ultrasound confirmed 2 masses in the 9 o'clock position of the right breast measuring 1.1 x 1.2 x 1.2 cm and the other one measuring 0.8 x 0.8 x 0.9 cm. These 2 masses were 1.7 cm apart. Sonographic evaluation of the right axilla did not show any enlarged adenopathy. 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  Baseline MUGA scan showed EF of 58%. MRI showed two distinct masses in the right breast 1.5 cm and 1.2 cm 2 cm apart no additional suspicious masses or abnormal enhancement identified. No suspicious adenopathy  6 cycles of neoadjuvant TCHP chemotherapy completed on 02/29/2020.Final pathology showed residual tumor of 5 mm.Negative margins. 1 sentinel lymph node negative for malignancy. Overall cancer cellularity 1%. Percentage of cancer that is in situ 0%.  [id number]  Patient is started on adjuvant Kadcyla on 04/26/2020.Treatment complicated by thrombocytopenia despite dose reduction.Patient switched to adjuvant Herceptin. Perjeta not continued due to significant diarrhea in the neoadjuvant setting  Interval history-patient reports pain in her bilateral hands.  Reports that her leg cramps have improved after taking magnesium supplements.  Reports ongoing fatigue  ECOG PS- 1 Pain scale- 3   Review of systems- Review of Systems  Constitutional: Positive for malaise/fatigue. Negative for chills, fever and weight loss.  HENT: Negative for congestion, ear discharge and nosebleeds.   Eyes: Negative for blurred vision.  Respiratory: Negative for cough, hemoptysis, sputum production, shortness of breath and wheezing.   Cardiovascular: Negative for chest pain, palpitations, orthopnea and claudication.  Gastrointestinal: Negative for abdominal pain, blood in stool, constipation, diarrhea, heartburn, melena, nausea and vomiting.  Genitourinary: Negative for dysuria, flank pain, frequency, hematuria and urgency.  Musculoskeletal: Negative for back pain, joint pain and myalgias.       Leg cramps  Skin: Negative for rash.  Neurological: Negative for dizziness, tingling, focal weakness, seizures, weakness and headaches.  Endo/Heme/Allergies: Does not bruise/bleed easily.  Psychiatric/Behavioral: Negative for depression and suicidal ideas. The patient does not have insomnia.        Allergies  Allergen Reactions  . Amlodipine Other (See Comments)    edema  . Statins Rash    Other reaction(s): Muscle Pain Possible hives also     Past Medical History:  Diagnosis Date  . Adverse effect of anesthesia    hard to awaken  . Anemia   . Arthritis   . Asthma   . Asthma without status asthmaticus    unspecified; Take advair each night  . Breast cancer ([place])   .  Bronchitis   . Constipation   . Diabetes ([place]) 2010  . Diabetes mellitus type 2,  uncomplicated ([place])   . Diabetes mellitus without complication ([place])   . Encounter for blood transfusion    hysterectomy 9-10 years ago  . Heart murmur    unspecified  . Hyperlipidemia   . Hypertension 1991  . Hypothyroid   . Multiple thyroid nodules   . Neuromuscular disorder ([facility])    neuropathy  . Osteoarthritis of right hip 02/04/2017  . Palpitations   . Pernicious anemia   . Personal history of radiation therapy   . Pneumonia   . Sleep apnea    Use C-PAP; can't use mouth piece. Anxiety  . Thyroid disease    nodules  . Thyroid nodule    x2  . Urinary urgency      Past Surgical History:  Procedure Laterality Date  . ABDOMINAL HYSTERECTOMY  2011   partial  . ABDOMINAL SURGERY    . BREAST BIOPSY Right 10/02/2020   affirm bx, x marker, path pending  . BREAST LUMPECTOMY Right    2021   . CARPAL TUNNEL RELEASE Left 2008  . CARPAL TUNNEL RELEASE Right   . CHOLECYSTECTOMY  1982  . COLONOSCOPY WITH PROPOFOL N/A 10/05/2017   Procedure: COLONOSCOPY WITH PROPOFOL;  Surgeon: Manya Silvas, MD;  Location: [facility];  Service: Endoscopy;  Laterality: N/A;  . DIAGNOSTIC LAPAROSCOPY    . JOINT REPLACEMENT Right 02/09/2017   TOTAL HIP REPLACEMENT, DR. CARTER. [place]  . KNEE ARTHROSCOPY  1990s  . PARTIAL MASTECTOMY WITH NEEDLE LOCALIZATION AND AXILLARY SENTINEL LYMPH NODE BX Right 03/28/2020   Procedure: PARTIAL MASTECTOMY WITH NEEDLE LOCALIZATION AND AXILLARY SENTINEL LYMPH NODE BX;  Surgeon: Herbert Pun, MD;  Location: [facility] ORS;  Service: General;  Laterality: Right;  . PORTACATH PLACEMENT Left 11/07/2019   Procedure: INSERTION PORT-A-CATH;  Surgeon: Herbert Pun, MD;  Location: [facility] ORS;  Service: General;  Laterality: Left;  . TOTAL HIP ARTHROPLASTY Left 06/23/2017   Procedure: TOTAL HIP ARTHROPLASTY ANTERIOR APPROACH;  Surgeon: Hessie Knows, MD;  Location: [facility] ORS;  Service: Orthopedics;  Laterality: Left;    Social History   Socioeconomic  History  . Marital status: Married    Spouse name: Not on file  . Number of children: 2  . Years of education: some college  . Highest education level: Not on file  Occupational History  . Occupation: retired    Comment: [employer]  Tobacco Use  . Smoking status: Never Smoker  . Smokeless tobacco: Never Used  Vaping Use  . Vaping Use: Never used  Substance and Sexual Activity  . Alcohol use: No  . Drug use: No  . Sexual activity: Not Currently  Other Topics Concern  . Not on file  Social History Narrative  . Not on file   Social Determinants of Health   Financial Resource Strain: Not on file  Food Insecurity: Not on file  Transportation Needs: Not on file  Physical Activity: Not on file  Stress: Not on file  Social Connections: Not on file  Intimate Partner Violence: Not on file    Family History  Problem Relation Age of Onset  . Heart disease Mother   . Hypertension Mother   . Arrhythmia Mother   . Heart attack Mother   . Diabetes Mother   . Hypertension Father   . Parkinson's disease Father   . Coronary artery disease Maternal Uncle   . Heart attack Maternal Uncle   .  Diabetes Sister   . Diabetes Brother   . Heart disease Brother   . Breast cancer Neg Hx      Current Outpatient Medications:  .  acetaminophen (TYLENOL) 500 MG tablet, Take 500 mg by mouth every 6 (six) hours as needed., Disp: , Rfl:  .  albuterol (PROVENTIL HFA;VENTOLIN HFA) 108 (90 Base) MCG/ACT inhaler, Inhale 2 puffs into the lungs every 6 (six) hours as needed for wheezing or shortness of breath., Disp: , Rfl:  .  carvedilol (COREG) 25 MG tablet, Take 25 mg by mouth 2 (two) times daily with a meal. , Disp: , Rfl:  .  cholecalciferol (VITAMIN D) 1000 units tablet, Take 2,000 Units by mouth daily. , Disp: , Rfl:  .  diphenoxylate-atropine (LOMOTIL) 2.5-0.025 MG tablet, Take 1 tablet by mouth 4 (four) times daily as needed for diarrhea or loose stools., Disp: 60 tablet, Rfl:  0 .  Fluticasone-Salmeterol (ADVAIR) 250-50 MCG/DOSE AEPB, Inhale 1 puff into the lungs 2 (two) times daily. , Disp: , Rfl:  .  letrozole (FEMARA) 2.5 MG tablet, Take 1 tablet (2.5 mg total) by mouth daily., Disp: 90 tablet, Rfl: 2 .  lidocaine-prilocaine (EMLA) cream, Apply 1 application topically as needed., Disp: 30 g, Rfl: 0 .  loperamide (IMODIUM) 2 MG capsule, Take 2 mg by mouth as needed for diarrhea or loose stools., Disp: , Rfl:  .  loratadine (CLARITIN) 10 MG tablet, Take 10 mg by mouth daily as needed for allergies. , Disp: , Rfl:  .  magnesium oxide (MAG-OX) 400 MG tablet, Take 1 tablet by mouth daily., Disp: , Rfl:  .  metFORMIN (GLUCOPHAGE-XR) 500 MG 24 hr tablet, Take 500 mg by mouth daily with supper. , Disp: , Rfl:  .  nystatin (MYCOSTATIN/NYSTOP) powder, Apply 1 application topically 3 (three) times daily., Disp: 60 g, Rfl: 2 .  pantoprazole (PROTONIX) 20 MG tablet, Take 1 tablet (20 mg total) by mouth 2 (two) times daily before a meal., Disp: 60 tablet, Rfl: 2 .  potassium chloride (KLOR-CON) 20 MEQ packet, Take 20 mEq by mouth daily., Disp: 90 packet, Rfl: 0 .  solifenacin (VESICARE) 5 MG tablet, Take 10 mg by mouth every evening. , Disp: , Rfl:  .  telmisartan-hydrochlorothiazide (MICARDIS HCT) 80-25 MG tablet, Take 1 tablet by mouth every morning. , Disp: , Rfl:  .  traMADol (ULTRAM) 50 MG tablet, Take 1 tablet (50 mg total) by mouth every 6 (six) hours as needed., Disp: 30 tablet, Rfl: 0 No current facility-administered medications for this visit.  Facility-Administered Medications Ordered in Other Visits:  .  sodium chloride flush (NS) 0.9 % injection 10 mL, 10 mL, Intravenous, PRN, Sindy Guadeloupe, MD, 10 mL at 01/05/20 1344 .  sodium chloride flush (NS) 0.9 % injection 10 mL, 10 mL, Intravenous, PRN, Sindy Guadeloupe, MD, 10 mL at 05/17/20 0858 .  sodium chloride flush (NS) 0.9 % injection 10 mL, 10 mL, Intracatheter, PRN, Sindy Guadeloupe, MD, 10 mL at 01/03/21  1143  Physical exam:  Vitals:   01/03/21 0954  BP: 125/68  Pulse: (!) 56  Resp: 16  Temp: 97.6 F (36.4 C)  TempSrc: Oral  SpO2: 100%  Weight: 190 lb 1.6 oz (86.2 kg)   Physical Exam Constitutional:      General: She is not in acute distress. Eyes:     Extraocular Movements: EOM normal.  Cardiovascular:     Rate and Rhythm: Normal rate and regular rhythm.  Heart sounds: Normal heart sounds.  Pulmonary:     Effort: Pulmonary effort is normal.  Skin:    General: Skin is warm and dry.  Neurological:     Mental Status: She is alert and oriented to person, place, and time.      CMP Latest Ref Rng & Units 01/03/2021  Glucose 70 - 99 mg/dL 114(H)  BUN 8 - 23 mg/dL 47(H)  Creatinine 0.44 - 1.00 mg/dL 1.70(H)  Sodium 135 - 145 mmol/L 141  Potassium 3.5 - 5.1 mmol/L 4.5  Chloride 98 - 111 mmol/L 104  CO2 22 - 32 mmol/L 28  Calcium 8.9 - 10.3 mg/dL 9.4  Total Protein 6.5 - 8.1 g/dL 6.8  Total Bilirubin 0.3 - 1.2 mg/dL 0.6  Alkaline Phos 38 - 126 U/L 89  AST 15 - 41 U/L 20  ALT 0 - 44 U/L 13   CBC Latest Ref Rng & Units 01/03/2021  WBC 4.0 - 10.5 K/uL 4.3  Hemoglobin 12.0 - 15.0 g/dL 9.9(L)  Hematocrit 36.0 - 46.0 % 32.0(L)  Platelets 150 - 400 K/uL 135(L)     Assessment and plan- Patient is a 65 y.o. female withinvasive mammary carcinoma of the right breast clinical prognostic stage IamcT1 ccN0 cM0 ER/PR positive HER-2/neu positive.She is s/p 6 cycles of neoadjuvant TCHP chemotherapy with residual 5 mm tumor.She had persistent cytopenias and thrombocytopenia with Kadcyla and was switched to single agent maintenance Herceptin.  She is here for on treatment assessment prior to cycle 16 of maintenance Herceptin  Counts okay to proceed with cycle 16 of maintenance Herceptin today.  She will return in 3 weeks for cycle 17 which will be her last cycle.  With this she has completed her neoadjuvant TCHP chemotherapy and adjuvant Herceptin.  She will need  echocardiogram/MUGA within the next 3 months.  We will add TSH to today's labs  I will see her back in 3 months with labs.  She will continue to use Arimidex for her ER positive breast cancer as well.  We are still awaiting dental clearance to start Zometa.  Suspect pain in her bilateral hands is secondary to peripheral neuropathy.  I did recommend gabapentin or Lyrica for her which can also help her with muscle cramps.  She has an upcoming appointment with neurology soon and will decide based on that.  She is continuing magnesium supplements for her muscle cramps for now     Visit Diagnosis 1. Invasive carcinoma of breast ([place])   2. Cold intolerance   3. Other fatigue   4. Long-term use of high-risk medication   5. Chemotherapy-induced peripheral neuropathy ([place])   6. Encounter for monoclonal antibody treatment for malignancy      Dr. Randa Evens, MD, MPH [facility] [phone] 01/03/2021 1:44 PM

## 2021-01-10 ENCOUNTER — Other Ambulatory Visit: Payer: Self-pay

## 2021-01-10 ENCOUNTER — Ambulatory Visit
Admission: RE | Admit: 2021-01-10 | Discharge: 2021-01-10 | Disposition: A | Discharge status: Home / Self Care | Payer: Medicare Other | Source: Ambulatory Visit | Attending: Oncology | Admitting: Oncology

## 2021-01-10 DIAGNOSIS — C50919 Malignant neoplasm of unspecified site of unspecified female breast: Secondary | ICD-10-CM | POA: Diagnosis present

## 2021-01-10 DIAGNOSIS — Z79899 Other long term (current) drug therapy: Secondary | ICD-10-CM

## 2021-01-10 MED ORDER — TECHNETIUM TC 99M-LABELED RED BLOOD CELLS IV KIT
20.0000 | PACK | Freq: Once | INTRAVENOUS | Status: AC | PRN
  Administered 2021-01-10: 21.295 via INTRAVENOUS

## 2021-01-17 ENCOUNTER — Ambulatory Visit: Payer: Medicare Other

## 2021-01-24 ENCOUNTER — Inpatient Hospital Stay: Payer: Medicare Other | Attending: Oncology

## 2021-01-24 ENCOUNTER — Telehealth: Payer: Self-pay | Admitting: Oncology

## 2021-01-24 VITALS — BP 148/63 | HR 62 | Temp 97.8°F | Wt 190.1 lb

## 2021-01-24 DIAGNOSIS — C50919 Malignant neoplasm of unspecified site of unspecified female breast: Secondary | ICD-10-CM

## 2021-01-24 DIAGNOSIS — Z170 Estrogen receptor positive status [ER+]: Secondary | ICD-10-CM | POA: Insufficient documentation

## 2021-01-24 DIAGNOSIS — Z5112 Encounter for antineoplastic immunotherapy: Secondary | ICD-10-CM | POA: Insufficient documentation

## 2021-01-24 DIAGNOSIS — C50911 Malignant neoplasm of unspecified site of right female breast: Secondary | ICD-10-CM | POA: Insufficient documentation

## 2021-01-24 MED ORDER — HEPARIN SOD (PORK) LOCK FLUSH 100 UNIT/ML IV SOLN
500.0000 [IU] | Freq: Once | INTRAVENOUS | Status: AC | PRN
  Administered 2021-01-24: 500 [IU]
  Filled 2021-01-24: qty 5

## 2021-01-24 MED ORDER — HEPARIN SOD (PORK) LOCK FLUSH 100 UNIT/ML IV SOLN
INTRAVENOUS | Status: AC
  Filled 2021-01-24: qty 5

## 2021-01-24 MED ORDER — DIPHENHYDRAMINE HCL 25 MG PO CAPS
25.0000 mg | ORAL_CAPSULE | Freq: Once | ORAL | Status: AC
  Administered 2021-01-24: 25 mg via ORAL
  Filled 2021-01-24: qty 1

## 2021-01-24 MED ORDER — SODIUM CHLORIDE 0.9 % IV SOLN
Freq: Once | INTRAVENOUS | Status: AC
  Filled 2021-01-24: qty 250

## 2021-01-24 MED ORDER — ACETAMINOPHEN 325 MG PO TABS
650.0000 mg | ORAL_TABLET | Freq: Once | ORAL | Status: AC
  Administered 2021-01-24: 650 mg via ORAL
  Filled 2021-01-24: qty 2

## 2021-01-24 MED ORDER — TRASTUZUMAB-DKST CHEMO 150 MG IV SOLR
6.0000 mg/kg | Freq: Once | INTRAVENOUS | Status: AC
  Administered 2021-01-24: 504 mg via INTRAVENOUS
  Filled 2021-01-24: qty 24

## 2021-01-25 NOTE — Telephone Encounter (Signed)
I called the pt. And talked to her about the last time she got herceptin that Dr. Janese Banks did not realize that pt only needed 1 more treatment after the one with dr Janese Banks that day. Her next one was scheduled for 3/3 and she had that one. I had told her that the appt. She needs in future is 5/10. She already had her echo. She says she remembers this now. Also she is have som loose but formed stools for a few days. She has started zinc. I told her that sometimes pt can have loose stools, and nausea with zinc, try taking it every other day and it continues she can call to the office and she is agreeable

## 2021-02-14 ENCOUNTER — Ambulatory Visit: Payer: Medicare Other

## 2021-02-14 ENCOUNTER — Other Ambulatory Visit: Payer: Medicare Other

## 2021-02-14 ENCOUNTER — Ambulatory Visit: Payer: Medicare Other | Admitting: Oncology

## 2021-03-01 ENCOUNTER — Encounter: Payer: Self-pay | Admitting: Oncology

## 2021-03-01 ENCOUNTER — Other Ambulatory Visit: Payer: Self-pay | Admitting: General Surgery

## 2021-03-01 DIAGNOSIS — Z853 Personal history of malignant neoplasm of breast: Secondary | ICD-10-CM

## 2021-04-02 ENCOUNTER — Inpatient Hospital Stay: Payer: Medicare Other | Attending: Oncology

## 2021-04-02 ENCOUNTER — Encounter: Payer: Self-pay | Admitting: Oncology

## 2021-04-02 ENCOUNTER — Inpatient Hospital Stay (HOSPITAL_BASED_OUTPATIENT_CLINIC_OR_DEPARTMENT_OTHER): Payer: Medicare Other | Admitting: Oncology

## 2021-04-02 VITALS — BP 153/72 | HR 48 | Temp 98.1°F | Resp 16 | Wt 198.0 lb

## 2021-04-02 DIAGNOSIS — Z08 Encounter for follow-up examination after completed treatment for malignant neoplasm: Secondary | ICD-10-CM | POA: Diagnosis not present

## 2021-04-02 DIAGNOSIS — G629 Polyneuropathy, unspecified: Secondary | ICD-10-CM | POA: Insufficient documentation

## 2021-04-02 DIAGNOSIS — R5383 Other fatigue: Secondary | ICD-10-CM

## 2021-04-02 DIAGNOSIS — Z170 Estrogen receptor positive status [ER+]: Secondary | ICD-10-CM | POA: Diagnosis not present

## 2021-04-02 DIAGNOSIS — G63 Polyneuropathy in diseases classified elsewhere: Secondary | ICD-10-CM

## 2021-04-02 DIAGNOSIS — Z79811 Long term (current) use of aromatase inhibitors: Secondary | ICD-10-CM

## 2021-04-02 DIAGNOSIS — D649 Anemia, unspecified: Secondary | ICD-10-CM | POA: Insufficient documentation

## 2021-04-02 DIAGNOSIS — C50919 Malignant neoplasm of unspecified site of unspecified female breast: Secondary | ICD-10-CM

## 2021-04-02 DIAGNOSIS — E538 Deficiency of other specified B group vitamins: Secondary | ICD-10-CM

## 2021-04-02 DIAGNOSIS — C50911 Malignant neoplasm of unspecified site of right female breast: Secondary | ICD-10-CM | POA: Diagnosis not present

## 2021-04-02 DIAGNOSIS — Z853 Personal history of malignant neoplasm of breast: Secondary | ICD-10-CM | POA: Diagnosis not present

## 2021-04-02 LAB — CBC WITH DIFFERENTIAL/PLATELET
Abs Immature Granulocytes: 0.01 10*3/uL (ref 0.00–0.07)
Basophils Absolute: 0.1 10*3/uL (ref 0.0–0.1)
Basophils Relative: 1 %
Eosinophils Absolute: 0.1 10*3/uL (ref 0.0–0.5)
Eosinophils Relative: 3 %
HCT: 33.3 % — ABNORMAL LOW (ref 36.0–46.0)
Hemoglobin: 9.9 g/dL — ABNORMAL LOW (ref 12.0–15.0)
Immature Granulocytes: 0 %
Lymphocytes Relative: 41 %
Lymphs Abs: 1.6 10*3/uL (ref 0.7–4.0)
MCH: 25.6 pg — ABNORMAL LOW (ref 26.0–34.0)
MCHC: 29.7 g/dL — ABNORMAL LOW (ref 30.0–36.0)
MCV: 86 fL (ref 80.0–100.0)
Monocytes Absolute: 0.5 10*3/uL (ref 0.1–1.0)
Monocytes Relative: 12 %
Neutro Abs: 1.7 10*3/uL (ref 1.7–7.7)
Neutrophils Relative %: 43 %
Platelets: 155 10*3/uL (ref 150–400)
RBC: 3.87 MIL/uL (ref 3.87–5.11)
RDW: 15.4 % (ref 11.5–15.5)
WBC: 4 10*3/uL (ref 4.0–10.5)
nRBC: 0 % (ref 0.0–0.2)

## 2021-04-02 LAB — COMPREHENSIVE METABOLIC PANEL
ALT: 15 U/L (ref 0–44)
AST: 20 U/L (ref 15–41)
Albumin: 4 g/dL (ref 3.5–5.0)
Alkaline Phosphatase: 106 U/L (ref 38–126)
Anion gap: 8 (ref 5–15)
BUN: 49 mg/dL — ABNORMAL HIGH (ref 8–23)
CO2: 26 mmol/L (ref 22–32)
Calcium: 9.5 mg/dL (ref 8.9–10.3)
Chloride: 104 mmol/L (ref 98–111)
Creatinine, Ser: 1.66 mg/dL — ABNORMAL HIGH (ref 0.44–1.00)
GFR, Estimated: 34 mL/min — ABNORMAL LOW (ref >60)
Glucose, Bld: 93 mg/dL (ref 70–99)
Potassium: 5 mmol/L (ref 3.5–5.1)
Sodium: 138 mmol/L (ref 135–145)
Total Bilirubin: 0.5 mg/dL (ref 0.3–1.2)
Total Protein: 7.2 g/dL (ref 6.5–8.1)

## 2021-04-02 MED ORDER — LETROZOLE 2.5 MG PO TABS: 2.5000 mg | ORAL_TABLET | Freq: Every day | ORAL | 2 refills | Status: DC

## 2021-04-02 MED ORDER — NYSTATIN 100000 UNIT/GM EX POWD: 1.0000 "application " | Freq: Three times a day (TID) | CUTANEOUS | 2 refills | Status: DC

## 2021-04-02 MED ORDER — HEPARIN SOD (PORK) LOCK FLUSH 100 UNIT/ML IV SOLN
500.0000 [IU] | Freq: Once | INTRAVENOUS | Status: AC
  Administered 2021-04-02: 500 [IU] via INTRAVENOUS
  Filled 2021-04-02: qty 5

## 2021-04-02 NOTE — Progress Notes (Signed)
Pt doing good. She still has some fungus under breast ant axilla at times. She has new dx stage 3 kidney disease

## 2021-04-02 NOTE — Progress Notes (Signed)
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 It showed 2 suspicious lesions in the right breast. Diagnostic mammogram and ultrasound confirmed 2 masses in the 9 o'clock position of the right breast measuring 1.1 x 1.2 x 1.2 cm and the other one measuring 0.8 x 0.8 x 0.9 cm. These 2 masses were 1.7 cm apart. Sonographic evaluation of the right axilla did not show any enlarged adenopathy. 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  Baseline MUGA scan showed EF of 58%. MRI showed two distinct masses in the right breast 1.5 cm and 1.2 cm 2 cm apart no additional suspicious masses or abnormal enhancement identified. No suspicious adenopathy  6 cycles of neoadjuvant TCHP chemotherapy completed on 02/29/2020.Final pathology showed residual tumor of 5 mm.Negative margins. 1 sentinel lymph node negative for malignancy. Overall cancer cellularity 1%. Percentage of cancer that is in situ 0%. [id number]  Patient is started on adjuvant  Kadcyla on 04/26/2020.Treatment complicated by thrombocytopenia despite dose reduction.Patient switched to adjuvant Herceptin. Perjeta not continued due to significant diarrhea in the neoadjuvant setting.  Patient completed adjuvant Herceptin in February 2022  Interval history-patient presently reports doing well.  Neuropathy is gradually improving.  Muscle cramps are better.  ECOG PS- 1 Pain scale- 0   Review of systems- Review of Systems  Constitutional: Positive for malaise/fatigue. Negative for chills, fever and weight loss.  HENT: Negative for congestion, ear discharge and nosebleeds.   Eyes: Negative for blurred vision.  Respiratory: Negative for cough, hemoptysis, sputum production, shortness of breath and wheezing.   Cardiovascular: Negative for chest pain, palpitations, orthopnea and claudication.  Gastrointestinal: Negative for abdominal pain, blood in stool, constipation, diarrhea, heartburn, melena, nausea and vomiting.  Genitourinary: Negative for dysuria, flank pain, frequency, hematuria and urgency.  Musculoskeletal: Negative for back pain, joint pain and myalgias.  Skin: Negative for rash.  Neurological: Positive for sensory change (peripheral neuro). Negative for dizziness, tingling, focal weakness, seizures, weakness and headaches.  Endo/Heme/Allergies: Does not bruise/bleed easily.  Psychiatric/Behavioral: Negative for depression and suicidal ideas. The patient does not have insomnia.       Allergies  Allergen Reactions  . Amlodipine Other (See Comments)    edema  . Statins Rash    Other reaction(s): Muscle Pain Possible hives also     Past Medical History:  Diagnosis Date  . Adverse effect of anesthesia    hard to awaken  . Anemia   . Arthritis   . Asthma   . Asthma without status asthmaticus    unspecified; Take advair each night  . Breast cancer ([place])   . Bronchitis   . Chronic kidney disease  stage III per dr Holley Raring  . Constipation   .  Diabetes ([place]) 2010  . Diabetes mellitus type 2, uncomplicated ([place])   . Diabetes mellitus without complication ([place])   . Encounter for blood transfusion    hysterectomy 9-10 years ago  . Heart murmur    unspecified  . Hyperlipidemia   . Hypertension 1991  . Hypothyroid   . Multiple thyroid nodules   . Neuromuscular disorder ([facility])    neuropathy  . Osteoarthritis of right hip 02/04/2017  . Palpitations   . Pernicious anemia   . Personal history of radiation therapy   . Pneumonia   . Sleep apnea    Use C-PAP; can't use mouth piece. Anxiety  . Thyroid disease    nodules  . Thyroid nodule    x2  . Urinary urgency      Past Surgical History:  Procedure Laterality Date  . ABDOMINAL HYSTERECTOMY  2011   partial  . ABDOMINAL SURGERY    . BREAST BIOPSY Right 10/02/2020   affirm bx, x marker, path pending  . BREAST LUMPECTOMY Right    2021   . CARPAL TUNNEL RELEASE Left 2008  . CARPAL TUNNEL RELEASE Right   . CHOLECYSTECTOMY  1982  . COLONOSCOPY WITH PROPOFOL N/A 10/05/2017   Procedure: COLONOSCOPY WITH PROPOFOL;  Surgeon: Manya Silvas, MD;  Location: [facility] ENDOSCOPY;  Service: Endoscopy;  Laterality: N/A;  . DIAGNOSTIC LAPAROSCOPY    . JOINT REPLACEMENT Right 02/09/2017   TOTAL HIP REPLACEMENT, DR. CARTER. [place]  . KNEE ARTHROSCOPY  1990s  . PARTIAL MASTECTOMY WITH NEEDLE LOCALIZATION AND AXILLARY SENTINEL LYMPH NODE BX Right 03/28/2020   Procedure: PARTIAL MASTECTOMY WITH NEEDLE LOCALIZATION AND AXILLARY SENTINEL LYMPH NODE BX;  Surgeon: Herbert Pun, MD;  Location: [facility] ORS;  Service: General;  Laterality: Right;  . PORTACATH PLACEMENT Left 11/07/2019   Procedure: INSERTION PORT-A-CATH;  Surgeon: Herbert Pun, MD;  Location: [facility] ORS;  Service: General;  Laterality: Left;  . TOTAL HIP ARTHROPLASTY Left 06/23/2017   Procedure: TOTAL HIP ARTHROPLASTY ANTERIOR APPROACH;  Surgeon: Hessie Knows, MD;  Location: [facility] ORS;  Service: Orthopedics;  Laterality:  Left;    Social History   Socioeconomic History  . Marital status: Married    Spouse name: Not on file  . Number of children: 2  . Years of education: some college  . Highest education level: Not on file  Occupational History  . Occupation: retired    Comment: [employer]  Tobacco Use  . Smoking status: Never Smoker  . Smokeless tobacco: Never Used  Vaping Use  . Vaping Use: Never used  Substance and Sexual Activity  . Alcohol use: No  . Drug use: No  . Sexual activity: Not Currently  Other Topics Concern  . Not on file  Social History Narrative  . Not on file   Social Determinants of Health   Financial Resource Strain: Not on file  Food Insecurity: Not on file  Transportation Needs: Not on file  Physical Activity: Not on file  Stress: Not on file  Social Connections: Not on file  Intimate Partner Violence: Not on file    Family History  Problem Relation Age of Onset  . Heart disease Mother   . Hypertension Mother   . Arrhythmia Mother   . Heart attack Mother   . Diabetes Mother   . Hypertension Father   . Parkinson's disease Father   . Coronary artery disease Maternal Uncle   . Heart attack  Maternal Uncle   . Diabetes Sister   . Diabetes Brother   . Heart disease Brother   . Breast cancer Neg Hx      Current Outpatient Medications:  .  acetaminophen (TYLENOL) 500 MG tablet, Take 500 mg by mouth every 6 (six) hours as needed., Disp: , Rfl:  .  albuterol (PROVENTIL HFA;VENTOLIN HFA) 108 (90 Base) MCG/ACT inhaler, Inhale 2 puffs into the lungs every 6 (six) hours as needed for wheezing or shortness of breath., Disp: , Rfl:  .  CALCIUM CITRATE PO, Take 1,000 mg by mouth daily., Disp: , Rfl:  .  carvedilol (COREG) 25 MG tablet, Take 25 mg by mouth 2 (two) times daily with a meal. , Disp: , Rfl:  .  cholecalciferol (VITAMIN D) 1000 units tablet, Take 2,000 Units by mouth daily. , Disp: , Rfl:  .  Fluticasone-Salmeterol (ADVAIR) 250-50 MCG/DOSE  AEPB, Inhale 1 puff into the lungs 2 (two) times daily. , Disp: , Rfl:  .  lidocaine-prilocaine (EMLA) cream, Apply 1 application topically as needed., Disp: 30 g, Rfl: 0 .  loratadine (CLARITIN) 10 MG tablet, Take 10 mg by mouth daily as needed for allergies. , Disp: , Rfl:  .  magnesium oxide (MAG-OX) 400 MG tablet, Take 2 tablets by mouth daily., Disp: , Rfl:  .  metFORMIN (GLUCOPHAGE-XR) 500 MG 24 hr tablet, Take 500 mg by mouth daily with supper. , Disp: , Rfl:  .  pregabalin (LYRICA) 25 MG capsule, Take 25 mg by mouth 2 (two) times daily., Disp: , Rfl:  .  solifenacin (VESICARE) 5 MG tablet, Take 10 mg by mouth every evening. , Disp: , Rfl:  .  spironolactone (ALDACTONE) 25 MG tablet, Take 25 mg by mouth 2 (two) times daily., Disp: , Rfl:  .  telmisartan-hydrochlorothiazide (MICARDIS HCT) 80-25 MG tablet, Take 1 tablet by mouth every morning. , Disp: , Rfl:  .  traMADol (ULTRAM) 50 MG tablet, Take 1 tablet (50 mg total) by mouth every 6 (six) hours as needed., Disp: 30 tablet, Rfl: 0 .  vitamin B-12 (CYANOCOBALAMIN) 1000 MCG tablet, Take 1,000 mcg by mouth daily., Disp: , Rfl:  .  letrozole (FEMARA) 2.5 MG tablet, Take 1 tablet (2.5 mg total) by mouth daily., Disp: 90 tablet, Rfl: 2 .  nystatin (MYCOSTATIN/NYSTOP) powder, Apply 1 application topically 3 (three) times daily., Disp: 60 g, Rfl: 2 No current facility-administered medications for this visit.  Facility-Administered Medications Ordered in Other Visits:  .  sodium chloride flush (NS) 0.9 % injection 10 mL, 10 mL, Intravenous, PRN, Sindy Guadeloupe, MD, 10 mL at 01/05/20 1344 .  sodium chloride flush (NS) 0.9 % injection 10 mL, 10 mL, Intravenous, PRN, Sindy Guadeloupe, MD, 10 mL at 05/17/20 0858  Physical exam:  Vitals:   04/02/21 1047  BP: (!) 153/72  Pulse: (!) 48  Resp: 16  Temp: 98.1 F (36.7 C)  TempSrc: Oral  Weight: 198 lb (89.8 kg)   Physical Exam Constitutional:      General: She is not in acute  distress. Cardiovascular:     Rate and Rhythm: Normal rate and regular rhythm.     Heart sounds: Normal heart sounds.  Pulmonary:     Effort: Pulmonary effort is normal.     Breath sounds: Normal breath sounds.  Abdominal:     General: Bowel sounds are normal.     Palpations: Abdomen is soft.  Skin:    General: Skin is warm and dry.  Neurological:  Mental Status: She is alert and oriented to person, place, and time.      CMP Latest Ref Rng & Units 04/02/2021  Glucose 70 - 99 mg/dL 93  BUN 8 - 23 mg/dL 49(H)  Creatinine 0.44 - 1.00 mg/dL 1.66(H)  Sodium 135 - 145 mmol/L 138  Potassium 3.5 - 5.1 mmol/L 5.0  Chloride 98 - 111 mmol/L 104  CO2 22 - 32 mmol/L 26  Calcium 8.9 - 10.3 mg/dL 9.5  Total Protein 6.5 - 8.1 g/dL 7.2  Total Bilirubin 0.3 - 1.2 mg/dL 0.5  Alkaline Phos 38 - 126 U/L 106  AST 15 - 41 U/L 20  ALT 0 - 44 U/L 15   CBC Latest Ref Rng & Units 04/02/2021  WBC 4.0 - 10.5 K/uL 4.0  Hemoglobin 12.0 - 15.0 g/dL 9.9(L)  Hematocrit 36.0 - 46.0 % 33.3(L)  Platelets 150 - 400 K/uL 155      Assessment and plan- Patient is a 65 y.o. female  withinvasive mammary carcinoma of the right breast clinical prognostic stage IamcT1 ccN0 cM0 ER/PR positive HER-2/neu positive.She is s/p 6 cycles of neoadjuvant TCHP chemotherapy with residual 5 mm tumor.She had persistent cytopenias and thrombocytopenia with Kadcyla and was switched to single agent maintenance Herceptin which she completed in February 2022.  She is here for routine follow-up  Clinically patient is doing well with no concerning signs and symptoms of recurrence based on today's visit.  She is due for a mammogram tomorrow.  She also had a recent echocardiogram Which showed normal EF of 64% mildly low as compared to 67.5 from prior exam but does not require any further follow-up since she is not going to be receiving any further Herceptin at this time.  Patient has ER positive disease and is on letrozole.  She  still has some ongoing dental concerns and therefore has not been able to start adjuvant bisphosphonates yet.  We will get in touch with Dr. Toy Cookey who is her dentist to see if she can obtain clearance at this time  Normocytic anemia: Check ferritin and iron studies B12 and folate at next visit  I will see her back in 4 months   Visit Diagnosis 1. Normocytic anemia   2. Encounter for follow-up surveillance of breast cancer   3. Use of letrozole (Femara)   4. 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 Randa Evens, MD, MPH [facility] at [facility] [phone] 04/02/2021 3:23 PM

## 2021-04-03 ENCOUNTER — Ambulatory Visit
Admission: RE | Admit: 2021-04-03 | Discharge: 2021-04-03 | Disposition: A | Discharge status: Home / Self Care | Payer: Medicare Other | Source: Ambulatory Visit | Attending: General Surgery | Admitting: General Surgery

## 2021-04-03 ENCOUNTER — Other Ambulatory Visit: Payer: Self-pay | Admitting: General Surgery

## 2021-04-03 ENCOUNTER — Other Ambulatory Visit: Payer: Self-pay

## 2021-04-03 DIAGNOSIS — Z853 Personal history of malignant neoplasm of breast: Secondary | ICD-10-CM

## 2021-04-03 HISTORY — DX: Personal history of antineoplastic chemotherapy: Z92.21

## 2021-04-25 ENCOUNTER — Telehealth: Payer: Self-pay | Admitting: *Deleted

## 2021-04-25 ENCOUNTER — Other Ambulatory Visit: Payer: Self-pay | Admitting: *Deleted

## 2021-04-25 NOTE — Telephone Encounter (Signed)
Faxed a Dental clearance form to Dr Maxwell Caul DDS at [facility]. Fax # [phone].

## 2021-05-17 ENCOUNTER — Other Ambulatory Visit: Payer: Self-pay | Admitting: *Deleted

## 2021-05-17 DIAGNOSIS — C50919 Malignant neoplasm of unspecified site of unspecified female breast: Secondary | ICD-10-CM

## 2021-05-22 ENCOUNTER — Telehealth: Payer: Self-pay | Admitting: Oncology

## 2021-05-22 ENCOUNTER — Encounter: Payer: Self-pay | Admitting: Oncology

## 2021-05-22 NOTE — Telephone Encounter (Signed)
Left VM with patient about scheduling appointment for lab work and zometa infusion.

## 2021-05-23 ENCOUNTER — Telehealth: Payer: Self-pay | Admitting: Oncology

## 2021-05-23 NOTE — Telephone Encounter (Signed)
Spoke with patient to confirm infusion appt on 7/1. Patient stated that she was still out of town and we have her rescheduled for 7/7.

## 2021-05-24 ENCOUNTER — Inpatient Hospital Stay: Payer: Medicare Other

## 2021-05-29 ENCOUNTER — Encounter: Payer: Self-pay | Admitting: Oncology

## 2021-05-30 ENCOUNTER — Inpatient Hospital Stay: Payer: Medicare Other

## 2021-05-30 ENCOUNTER — Inpatient Hospital Stay: Payer: Medicare Other | Attending: Oncology

## 2021-05-30 VITALS — BP 140/55 | HR 52 | Temp 97.2°F | Wt 196.0 lb

## 2021-05-30 DIAGNOSIS — Z08 Encounter for follow-up examination after completed treatment for malignant neoplasm: Secondary | ICD-10-CM

## 2021-05-30 DIAGNOSIS — C50911 Malignant neoplasm of unspecified site of right female breast: Secondary | ICD-10-CM | POA: Diagnosis present

## 2021-05-30 DIAGNOSIS — Z79811 Long term (current) use of aromatase inhibitors: Secondary | ICD-10-CM | POA: Diagnosis not present

## 2021-05-30 DIAGNOSIS — C50919 Malignant neoplasm of unspecified site of unspecified female breast: Secondary | ICD-10-CM

## 2021-05-30 DIAGNOSIS — Z170 Estrogen receptor positive status [ER+]: Secondary | ICD-10-CM | POA: Insufficient documentation

## 2021-05-30 DIAGNOSIS — D649 Anemia, unspecified: Secondary | ICD-10-CM

## 2021-05-30 LAB — CBC WITH DIFFERENTIAL/PLATELET
Abs Immature Granulocytes: 0.01 10*3/uL (ref 0.00–0.07)
Basophils Absolute: 0 10*3/uL (ref 0.0–0.1)
Basophils Relative: 1 %
Eosinophils Absolute: 0.1 10*3/uL (ref 0.0–0.5)
Eosinophils Relative: 2 %
HCT: 32 % — ABNORMAL LOW (ref 36.0–46.0)
Hemoglobin: 9.7 g/dL — ABNORMAL LOW (ref 12.0–15.0)
Immature Granulocytes: 0 %
Lymphocytes Relative: 40 %
Lymphs Abs: 1.6 10*3/uL (ref 0.7–4.0)
MCH: 26.1 pg (ref 26.0–34.0)
MCHC: 30.3 g/dL (ref 30.0–36.0)
MCV: 86.3 fL (ref 80.0–100.0)
Monocytes Absolute: 0.5 10*3/uL (ref 0.1–1.0)
Monocytes Relative: 13 %
Neutro Abs: 1.7 10*3/uL (ref 1.7–7.7)
Neutrophils Relative %: 44 %
Platelets: 155 10*3/uL (ref 150–400)
RBC: 3.71 MIL/uL — ABNORMAL LOW (ref 3.87–5.11)
RDW: 16.2 % — ABNORMAL HIGH (ref 11.5–15.5)
WBC: 4 10*3/uL (ref 4.0–10.5)
nRBC: 0 % (ref 0.0–0.2)

## 2021-05-30 LAB — COMPREHENSIVE METABOLIC PANEL
ALT: 15 U/L (ref 0–44)
AST: 21 U/L (ref 15–41)
Albumin: 3.9 g/dL (ref 3.5–5.0)
Alkaline Phosphatase: 97 U/L (ref 38–126)
Anion gap: 6 (ref 5–15)
BUN: 47 mg/dL — ABNORMAL HIGH (ref 8–23)
CO2: 29 mmol/L (ref 22–32)
Calcium: 9.5 mg/dL (ref 8.9–10.3)
Chloride: 103 mmol/L (ref 98–111)
Creatinine, Ser: 1.73 mg/dL — ABNORMAL HIGH (ref 0.44–1.00)
GFR, Estimated: 32 mL/min — ABNORMAL LOW (ref >60)
Glucose, Bld: 97 mg/dL (ref 70–99)
Potassium: 4.8 mmol/L (ref 3.5–5.1)
Sodium: 138 mmol/L (ref 135–145)
Total Bilirubin: 0.7 mg/dL (ref 0.3–1.2)
Total Protein: 6.8 g/dL (ref 6.5–8.1)

## 2021-05-30 MED ORDER — SODIUM CHLORIDE 0.9 % IV SOLN
Freq: Once | INTRAVENOUS | Status: AC
  Filled 2021-05-30: qty 250

## 2021-05-30 MED ORDER — HEPARIN SOD (PORK) LOCK FLUSH 100 UNIT/ML IV SOLN
500.0000 [IU] | Freq: Once | INTRAVENOUS | Status: AC | PRN
  Administered 2021-05-30: 500 [IU]
  Filled 2021-05-30: qty 5

## 2021-05-30 MED ORDER — HEPARIN SOD (PORK) LOCK FLUSH 100 UNIT/ML IV SOLN
INTRAVENOUS | Status: AC
  Filled 2021-05-30: qty 5

## 2021-05-30 MED ORDER — ZOLEDRONIC ACID 4 MG/5ML IV CONC
3.3000 mg | INTRAVENOUS | Status: DC
  Administered 2021-05-30: 3.3 mg via INTRAVENOUS
  Filled 2021-05-30: qty 4.13

## 2021-05-30 MED ORDER — SODIUM CHLORIDE 0.9% FLUSH
10.0000 mL | Freq: Once | INTRAVENOUS | Status: AC
  Administered 2021-05-30: 10 mL via INTRAVENOUS
  Filled 2021-05-30: qty 10

## 2021-05-30 NOTE — Patient Instructions (Signed)
[facility] [place] [facility]  Discharge Instructions: Thank you for choosing [facility] to provide your oncology and hematology care.  If you have a lab appointment with the Cancer Center, please go directly to the Cancer Center and check in at the registration area.  Wear comfortable clothing and clothing appropriate for easy access to any Portacath or PICC line.   We strive to give you quality time with your provider. You may need to reschedule your appointment if you arrive late (15 or more minutes).  Arriving late affects you and other patients whose appointments are after yours.  Also, if you miss three or more appointments without notifying the office, you may be dismissed from the clinic at the provider's discretion.      For prescription refill requests, have your pharmacy contact our office and allow 72 hours for refills to be completed.    Today you received the following : Zometa    To help prevent nausea and vomiting after your treatment, we encourage you to take your nausea medication as directed.  BELOW ARE SYMPTOMS THAT SHOULD BE REPORTED IMMEDIATELY: . *FEVER GREATER THAN 100.4 F (38 C) OR HIGHER . *CHILLS OR SWEATING . *NAUSEA AND VOMITING THAT IS NOT CONTROLLED WITH YOUR NAUSEA MEDICATION . *UNUSUAL SHORTNESS OF BREATH . *UNUSUAL BRUISING OR BLEEDING . *URINARY PROBLEMS (pain or burning when urinating, or frequent urination) . *BOWEL PROBLEMS (unusual diarrhea, constipation, pain near the anus) . TENDERNESS IN MOUTH AND THROAT WITH OR WITHOUT PRESENCE OF ULCERS (sore throat, sores in mouth, or a toothache) . UNUSUAL RASH, SWELLING OR PAIN  . UNUSUAL VAGINAL DISCHARGE OR ITCHING   Items with * indicate a potential emergency and should be followed up as soon as possible or go to the Emergency Department if any problems should occur.  Please show the CHEMOTHERAPY ALERT CARD or IMMUNOTHERAPY ALERT CARD at check-in to the Emergency  Department and triage nurse.  Should you have questions after your visit or need to cancel or reschedule your appointment, please contact [facility] [place] [facility]  [phone] and follow the prompts.  Office hours are 8:00 a.m. to 4:30 p.m. Monday - Friday. Please note that voicemails left after 4:00 p.m. may not be returned until the following business day.  We are closed weekends and major holidays. You have access to a nurse at all times for urgent questions. Please call the main number to the clinic [phone] and follow the prompts.  For any non-urgent questions, you may also contact your provider using MyChart. We now offer e-Visits for anyone 18 and older to request care online for non-urgent symptoms. For details visit [email or web address].   Also download the MyChart app! Go to the app store, search "MyChart", open the app, select [facility], and log in with your MyChart username and password.  Due to Covid, a mask is required upon entering the hospital/clinic. If you do not have a mask, one will be given to you upon arrival. For doctor visits, patients may have 1 support person aged 18 or older with them. For treatment visits, patients cannot have anyone with them due to current Covid guidelines and our immunocompromised population.  

## 2021-06-05 ENCOUNTER — Other Ambulatory Visit: Payer: Self-pay

## 2021-06-05 ENCOUNTER — Inpatient Hospital Stay (HOSPITAL_BASED_OUTPATIENT_CLINIC_OR_DEPARTMENT_OTHER): Payer: Medicare Other | Admitting: Hospice and Palliative Medicine

## 2021-06-05 ENCOUNTER — Telehealth: Payer: Self-pay

## 2021-06-05 ENCOUNTER — Inpatient Hospital Stay: Payer: Medicare Other

## 2021-06-05 VITALS — BP 100/66 | HR 73 | Temp 98.9°F | Resp 16

## 2021-06-05 DIAGNOSIS — M791 Myalgia, unspecified site: Secondary | ICD-10-CM

## 2021-06-05 DIAGNOSIS — Z08 Encounter for follow-up examination after completed treatment for malignant neoplasm: Secondary | ICD-10-CM

## 2021-06-05 DIAGNOSIS — C50919 Malignant neoplasm of unspecified site of unspecified female breast: Secondary | ICD-10-CM

## 2021-06-05 DIAGNOSIS — C50911 Malignant neoplasm of unspecified site of right female breast: Secondary | ICD-10-CM | POA: Diagnosis not present

## 2021-06-05 DIAGNOSIS — D649 Anemia, unspecified: Secondary | ICD-10-CM

## 2021-06-05 LAB — COMPREHENSIVE METABOLIC PANEL
ALT: 17 U/L (ref 0–44)
AST: 25 U/L (ref 15–41)
Albumin: 3.9 g/dL (ref 3.5–5.0)
Alkaline Phosphatase: 90 U/L (ref 38–126)
Anion gap: 7 (ref 5–15)
BUN: 38 mg/dL — ABNORMAL HIGH (ref 8–23)
CO2: 24 mmol/L (ref 22–32)
Calcium: 8.9 mg/dL (ref 8.9–10.3)
Chloride: 102 mmol/L (ref 98–111)
Creatinine, Ser: 1.86 mg/dL — ABNORMAL HIGH (ref 0.44–1.00)
GFR, Estimated: 30 mL/min — ABNORMAL LOW (ref >60)
Glucose, Bld: 101 mg/dL — ABNORMAL HIGH (ref 70–99)
Potassium: 5 mmol/L (ref 3.5–5.1)
Sodium: 133 mmol/L — ABNORMAL LOW (ref 135–145)
Total Bilirubin: 0.5 mg/dL (ref 0.3–1.2)
Total Protein: 7.5 g/dL (ref 6.5–8.1)

## 2021-06-05 LAB — CBC WITH DIFFERENTIAL/PLATELET
Abs Immature Granulocytes: 0.03 10*3/uL (ref 0.00–0.07)
Basophils Absolute: 0.1 10*3/uL (ref 0.0–0.1)
Basophils Relative: 1 %
Eosinophils Absolute: 0.1 10*3/uL (ref 0.0–0.5)
Eosinophils Relative: 3 %
HCT: 33.5 % — ABNORMAL LOW (ref 36.0–46.0)
Hemoglobin: 10.2 g/dL — ABNORMAL LOW (ref 12.0–15.0)
Immature Granulocytes: 1 %
Lymphocytes Relative: 23 %
Lymphs Abs: 1.1 10*3/uL (ref 0.7–4.0)
MCH: 25.6 pg — ABNORMAL LOW (ref 26.0–34.0)
MCHC: 30.4 g/dL (ref 30.0–36.0)
MCV: 84.2 fL (ref 80.0–100.0)
Monocytes Absolute: 0.5 10*3/uL (ref 0.1–1.0)
Monocytes Relative: 11 %
Neutro Abs: 2.9 10*3/uL (ref 1.7–7.7)
Neutrophils Relative %: 61 %
Platelets: 153 10*3/uL (ref 150–400)
RBC: 3.98 MIL/uL (ref 3.87–5.11)
RDW: 16.2 % — ABNORMAL HIGH (ref 11.5–15.5)
WBC: 4.7 10*3/uL (ref 4.0–10.5)
nRBC: 0 % (ref 0.0–0.2)

## 2021-06-05 NOTE — Progress Notes (Signed)
Pt reports chills and body aches since Friday of last week, after receiving Zometa on Thursday. Has tried Tylenol, which she sates does improve the achy feeling. Reports pain is mainly in her arms and rates 3/10 at this time; reports taking tylenol prior to appointment.

## 2021-06-05 NOTE — Telephone Encounter (Signed)
Pt left two messages on VM stating she has not felt well since last Thursday after receiving her Zometa infusion dealing with body aches/chills chills tend to get worse during the night. Spoke with Dr. Janese Banks on Friday and stated she was taking tylenol, felt somewhere better. Felt okay throughout the weekend, but today feels horrible and would like to see someone. Returned pts call and stated she feels the same and is still taking tylenol but sx come back hours later. Josh agreed to see pt in symptom management this afternoon, pt agrees.

## 2021-06-05 NOTE — Progress Notes (Signed)
Symptom Management [place]  [phone] Fax:([phone]) [phone]  Patient Care Team: Perrin Maltese, MD as PCP - General (Internal Medicine) Theodore Demark, RN as Registered Nurse (Oncology) Sindy Guadeloupe, MD as Consulting Physician (Hematology and Oncology) Sindy Guadeloupe, MD as Consulting Physician (Hematology and Oncology) Sindy Guadeloupe, MD as Consulting Physician (Hematology and Oncology)   Name of the patient: Christina Mcclain  [id number]  06-09-1956   Date of visit: 06/05/21  Reason for Consult: Ms. Renise Gillies is a 65 year old woman with multiple medical problems including stage Ia right breast cancer status post neoadjuvant TCHP chemotherapy (completed on 02/29/2020) and adjuvant Herceptin, following her lumpectomy, which she completed February 2022.  Patient last saw Dr. Janese Banks on 04/02/2021 at which time she appeared to be doing well clinically without signs or symptoms of recurrence.  She is on letrozole.  Patient presents to [facility] today with body aches which have persisted since that she received Zometa on 05/30/2021.  Patient has been taking acetaminophen, which helps, but symptoms return some hours later.  Patient is also had chills, primarily at night but denies subjective fevers. No pulmonary symptoms including cough, congestion, rhinorrhea, or sore throat.  She actually reports that her symptoms are somewhat improved today.  She denies any neurologic complaints. Denies recent fevers or illnesses.  No cough or congestion.  No shortness of breath.  Denies any easy bleeding or bruising. Reports good appetite and denies weight loss. Denies chest pain. Denies any nausea, vomiting, constipation, or diarrhea. Denies urinary complaints. Patient offers no further specific complaints today.  PAST MEDICAL HISTORY: Past Medical History:  Diagnosis Date   Adverse effect of anesthesia    hard to awaken   Anemia    Arthritis    Asthma     Asthma without status asthmaticus    unspecified; Take advair each night   Breast cancer ([place]) 2021   right breast IMC   Bronchitis    Chronic kidney disease    stage III per dr Holley Raring   Constipation    Diabetes ([place]) 2010   Diabetes mellitus type 2, uncomplicated ([place])    Diabetes mellitus without complication ([place])    Encounter for blood transfusion    hysterectomy 9-10 years ago   Heart murmur    unspecified   Hyperlipidemia    Hypertension 1991   Hypothyroid    Multiple thyroid nodules    Neuromuscular disorder ([facility])    neuropathy   Osteoarthritis of right hip 02/04/2017   Palpitations    Pernicious anemia    Personal history of chemotherapy 2020-2021   Right breast ca   Personal history of radiation therapy 2021   right breast [facility]   Pneumonia    Sleep apnea    Use C-PAP; can't use mouth piece. Anxiety   Thyroid disease    nodules   Thyroid nodule    x2   Urinary urgency     PAST SURGICAL HISTORY:  Past Surgical History:  Procedure Laterality Date   ABDOMINAL HYSTERECTOMY  2011   partial   ABDOMINAL SURGERY     BREAST BIOPSY Right 10/02/2020   affirm bx, x marker, fat necrosis   BREAST BIOPSY Right 10/19/2019   two masses at 9:00 [place] bx, coil marker and venus marker, IMC for both   BREAST LUMPECTOMY Right 03/28/2020   bracketing of two IMC masses at 9:00.  Venus marker was pushed medially during wire loc. Remains in breast.  Clear surgical  margins. Negative Lymph node   CARPAL TUNNEL RELEASE Left 2008   CARPAL TUNNEL RELEASE Right    CHOLECYSTECTOMY  1982   COLONOSCOPY WITH PROPOFOL N/A 10/05/2017   Procedure: COLONOSCOPY WITH PROPOFOL;  Surgeon: Manya Silvas, MD;  Location: [facility] ENDOSCOPY;  Service: Endoscopy;  Laterality: N/A;   DIAGNOSTIC LAPAROSCOPY     JOINT REPLACEMENT Right 02/09/2017   TOTAL HIP REPLACEMENT, DR. CARTER. [place]   KNEE ARTHROSCOPY  1990s   PARTIAL MASTECTOMY WITH NEEDLE LOCALIZATION AND AXILLARY SENTINEL LYMPH NODE BX Right  03/28/2020   Procedure: PARTIAL MASTECTOMY WITH NEEDLE LOCALIZATION AND AXILLARY SENTINEL LYMPH NODE BX;  Surgeon: Herbert Pun, MD;  Location: [facility] ORS;  Service: General;  Laterality: Right;   PORTACATH PLACEMENT Left 11/07/2019   Procedure: INSERTION PORT-A-CATH;  Surgeon: Herbert Pun, MD;  Location: [facility] ORS;  Service: General;  Laterality: Left;   TOTAL HIP ARTHROPLASTY Left 06/23/2017   Procedure: TOTAL HIP ARTHROPLASTY ANTERIOR APPROACH;  Surgeon: Hessie Knows, MD;  Location: [facility] ORS;  Service: Orthopedics;  Laterality: Left;    HEMATOLOGY/ONCOLOGY HISTORY:  Oncology History Overview Note  patient is a 65 year old African-American female who underwent a routine screening mammogram on 10/06/2019. It showed 2 suspicious lesions in the right breast. Diagnostic mammogram and ultrasound confirmed 2 masses in the 9 o'clock position of the right breast measuring 1.1 x 1.2 x 1.2 cm and the other one measuring 0.8 x 0.8 x 0.9 cm. These 2 masses were 1.7 cm apart. Sonographic evaluation of the right axilla did not show any enlarged adenopathy. 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    Baseline MUGA scan showed EF of 58%.  MRI showed two distinct masses in the right breast 1.5 cm and 1.2 cm 2 cm apart no additional suspicious masses or abnormal enhancement identified.  No suspicious adenopathy   6 cycles of neoadjuvant TCHP chemotherapy completed on 02/29/2020.   Invasive carcinoma of breast ([place])  10/28/2019 Cancer Staging   Staging form: Breast, AJCC 8th Edition - Clinical stage from 10/28/2019: Stage IA (cT1c, cN0, cM0, G3, ER+, PR+, HER2+) - Signed by Sindy Guadeloupe, MD on 10/31/2019    10/31/2019 Initial Diagnosis   Invasive carcinoma of breast ([place])    11/10/2019 - 03/02/2020 Chemotherapy   The patient had dexamethasone (DECADRON) 4 MG  tablet, 8 mg, , , 1 of 1 cycle, Start date: 12/29/2019, End date: 01/13/2020 palonosetron (ALOXI) injection 0.25 mg, 0.25 mg, Intravenous,  Once, 6 of 6 cycles Administration: 0.25 mg (11/10/2019), 0.25 mg (12/01/2019), 0.25 mg (02/09/2020), 0.25 mg (03/01/2020), 0.25 mg (12/29/2019), 0.25 mg (01/19/2020) pegfilgrastim (NEULASTA) injection 6 mg, 6 mg, Subcutaneous, Once, 3 of 3 cycles Administration: 6 mg (01/20/2020), 6 mg (02/10/2020), 6 mg (03/02/2020) pegfilgrastim (NEULASTA ONPRO KIT) injection 6 mg, 6 mg, Subcutaneous, Once, 3 of 3 cycles Administration: 6 mg (11/10/2019), 6 mg (12/01/2019), 6 mg (12/29/2019) CARBOplatin (PARAPLATIN) 630 mg in sodium chloride 0.9 % 250 mL chemo infusion, 630 mg (100 % of original dose 627.5 mg), Intravenous,  Once, 6 of 6 cycles Dose modification:   (original dose 627.5 mg, Cycle 1) Administration: 630 mg (11/10/2019), 540 mg (12/01/2019), 380 mg (02/09/2020), 340 mg (03/01/2020), 460 mg (12/29/2019), 460 mg (01/19/2020) DOCEtaxel (TAXOTERE) 150 mg in sodium chloride 0.9 % 250 mL chemo infusion, 75 mg/m2 = 150 mg, Intravenous,  Once, 6 of 6 cycles Dose  modification: 60 mg/m2 (original dose 75 mg/m2, Cycle 5, Reason: Other (see comments), Comment: neutropenia) Administration: 150 mg (11/10/2019), 150 mg (12/01/2019), 120 mg (02/09/2020), 120 mg (03/01/2020), 120 mg (12/29/2019), 120 mg (01/19/2020) fosaprepitant (EMEND) 150 mg in sodium chloride 0.9 % 145 mL IVPB, 150 mg, Intravenous,  Once, 6 of 6 cycles Administration: 150 mg (11/10/2019), 150 mg (12/01/2019), 150 mg (02/09/2020), 150 mg (03/01/2020), 150 mg (12/29/2019), 150 mg (01/19/2020) pertuzumab (PERJETA) 840 mg in sodium chloride 0.9 % 250 mL chemo infusion, 840 mg, Intravenous, Once, 6 of 6 cycles Administration: 840 mg (11/10/2019), 420 mg (12/01/2019), 420 mg (02/09/2020), 420 mg (03/01/2020), 420 mg (12/29/2019), 420 mg (01/19/2020) trastuzumab-dkst (OGIVRI) 750 mg in sodium chloride 0.9 % 250 mL chemo infusion, 756 mg, Intravenous,  Once, 6 of 6  cycles Administration: 750 mg (11/10/2019), 600 mg (12/01/2019), 540 mg (02/09/2020), 540 mg (03/01/2020), 540 mg (12/29/2019), 540 mg (01/19/2020)   for chemotherapy treatment.     04/26/2020 - 05/29/2020 Chemotherapy   The patient had ado-trastuzumab emtansine (KADCYLA) 320 mg in sodium chloride 0.9 % 250 mL chemo infusion, 300 mg, Intravenous, Once, 2 of 5 cycles Dose modification: 3 mg/kg (original dose 3.6 mg/kg, Cycle 2, Reason: Other (see comments), Comment: cytopenias) Administration: 320 mg (04/26/2020), 240 mg (05/29/2020)   for chemotherapy treatment.     06/28/2020 -  Chemotherapy    Patient is on Treatment Plan: BREAST TRASTUZUMAB + PERTUZUMAB Q21D         ALLERGIES:  is allergic to amlodipine and statins.  MEDICATIONS:  Current Outpatient Medications  Medication Sig Dispense Refill   acetaminophen (TYLENOL) 500 MG tablet Take 500 mg by mouth every 6 (six) hours as needed.     albuterol (PROVENTIL HFA;VENTOLIN HFA) 108 (90 Base) MCG/ACT inhaler Inhale 2 puffs into the lungs every 6 (six) hours as needed for wheezing or shortness of breath.     CALCIUM CITRATE PO Take 1,000 mg by mouth daily.     carvedilol (COREG) 25 MG tablet Take 25 mg by mouth 2 (two) times daily with a meal.      cholecalciferol (VITAMIN D) 1000 units tablet Take 2,000 Units by mouth daily.      Fluticasone-Salmeterol (ADVAIR) 250-50 MCG/DOSE AEPB Inhale 1 puff into the lungs 2 (two) times daily.      letrozole (FEMARA) 2.5 MG tablet Take 1 tablet (2.5 mg total) by mouth daily. 90 tablet 2   lidocaine-prilocaine (EMLA) cream Apply 1 application topically as needed. 30 g 0   loratadine (CLARITIN) 10 MG tablet Take 10 mg by mouth daily as needed for allergies.      magnesium oxide (MAG-OX) 400 MG tablet Take 2 tablets by mouth daily.     metFORMIN (GLUCOPHAGE-XR) 500 MG 24 hr tablet Take 500 mg by mouth daily with supper.      nystatin (MYCOSTATIN/NYSTOP) powder Apply 1 application topically 3 (three) times daily.  60 g 2   pregabalin (LYRICA) 25 MG capsule Take 25 mg by mouth 2 (two) times daily.     solifenacin (VESICARE) 5 MG tablet Take 10 mg by mouth every evening.      spironolactone (ALDACTONE) 25 MG tablet Take 25 mg by mouth 2 (two) times daily.     telmisartan-hydrochlorothiazide (MICARDIS HCT) 80-25 MG tablet Take 1 tablet by mouth every morning.      traMADol (ULTRAM) 50 MG tablet Take 1 tablet (50 mg total) by mouth every 6 (six) hours as needed. 30 tablet 0   vitamin B-12 (  CYANOCOBALAMIN) 1000 MCG tablet Take 1,000 mcg by mouth daily.     No current facility-administered medications for this visit.   Facility-Administered Medications Ordered in Other Visits  Medication Dose Route Frequency Provider Last Rate Last Admin   sodium chloride flush (NS) 0.9 % injection 10 mL  10 mL Intravenous PRN Sindy Guadeloupe, MD   10 mL at 01/05/20 1344   sodium chloride flush (NS) 0.9 % injection 10 mL  10 mL Intravenous PRN Sindy Guadeloupe, MD   10 mL at 05/17/20 0858    VITAL SIGNS: There were no vitals taken for this visit. There were no vitals filed for this visit.  Estimated body mass index is 35.85 kg/m as calculated from the following:   Height as of 03/28/20: 5\' 2"  (1.575 m).   Weight as of 05/30/21: 196 lb (88.9 kg).  LABS: CBC:    Component Value Date/Time   WBC 4.0 05/30/2021 0859   HGB 9.7 (L) 05/30/2021 0859   HCT 32.0 (L) 05/30/2021 0859   PLT 155 05/30/2021 0859   MCV 86.3 05/30/2021 0859   NEUTROABS 1.7 05/30/2021 0859   LYMPHSABS 1.6 05/30/2021 0859   MONOABS 0.5 05/30/2021 0859   EOSABS 0.1 05/30/2021 0859   BASOSABS 0.0 05/30/2021 0859   Comprehensive Metabolic Panel:    Component Value Date/Time   NA 138 05/30/2021 0859   K 4.8 05/30/2021 0859   CL 103 05/30/2021 0859   CO2 29 05/30/2021 0859   BUN 47 (H) 05/30/2021 0859   CREATININE 1.73 (H) 05/30/2021 0859   GLUCOSE 97 05/30/2021 0859   CALCIUM 9.5 05/30/2021 0859   AST 21 05/30/2021 0859   ALT 15 05/30/2021 0859    ALKPHOS 97 05/30/2021 0859   BILITOT 0.7 05/30/2021 0859   PROT 6.8 05/30/2021 0859   ALBUMIN 3.9 05/30/2021 0859    RADIOGRAPHIC STUDIES: No results found.  PERFORMANCE STATUS (ECOG) : 1 - Symptomatic but completely ambulatory  Review of Systems Unless otherwise noted, a complete review of systems is negative.  Physical Exam General: NAD Cardiovascular: regular rate and rhythm Pulmonary: clear anterior/posterior fields Abdomen: soft, nontender, + bowel sounds GU: no suprapubic tenderness Extremities: no edema, no joint deformities Skin: no rashes Neurological: Grossly nonfocal  Assessment and Plan- Patient is a 65 y.o. female with multiple medical problems including stage Ia right breast cancer status post neoadjuvant TCHP chemotherapy (completed on 02/29/2020) and adjuvant Herceptin, following her lumpectomy, which she completed February 2022.  Patient was seen in [facility] today for evaluation of myalgias.   Myalgias -likely secondary to Zometa.  She reports that symptoms are improved today and I am hopeful that they will continue improving daily.  Most often, musculoskeletal pain and flulike symptoms due to acute phase reaction from bisphosphonates improves within a week.  Discussed continuing scheduled acetaminophen (max 4,000mg  daily).  We will avoid NSAIDs given CKD.  Will avoid steroids given diabetes.  Patient did appear slightly dehydrated on labs today but declined IV fluids.  Encouraged p.o. fluids.  Discussed triggers for clinic/ER evaluation including worsening symptoms.  Case and plan discussed with Dr. Janese Banks   Patient expressed understanding and was in agreement with this plan. She also understands that She can call clinic at any time with any questions, concerns, or complaints.   Thank you for allowing me to participate in the care of this very pleasant patient.   Time Total: 15 minutes  Visit consisted of counseling and education dealing with the complex and  emotionally  intense issues of symptom management and palliative care in the setting of serious and potentially life-threatening illness.Greater than 50%  of this time was spent counseling and coordinating care related to the above assessment and plan.  Signed by: Altha Harm, PhD, NP-C

## 2021-06-06 ENCOUNTER — Inpatient Hospital Stay (HOSPITAL_BASED_OUTPATIENT_CLINIC_OR_DEPARTMENT_OTHER): Payer: Medicare Other | Admitting: Hospice and Palliative Medicine

## 2021-06-06 DIAGNOSIS — M791 Myalgia, unspecified site: Secondary | ICD-10-CM | POA: Diagnosis not present

## 2021-06-06 NOTE — Progress Notes (Signed)
Virtual Visit via Telephone Note  I connected with Christina Mcclain on 06/06/21 at  9:30 AM EDT by telephone and verified that I am speaking with the correct person using two identifiers.  Location: Patient: Home Provider: Clinic   I discussed the limitations, risks, security and privacy concerns of performing an evaluation and management service by telephone and the availability of in person appointments. I also discussed with the patient that there may be a patient responsible charge related to this service. The patient expressed understanding and agreed to proceed.   History of Present Illness: Ms. Christina Mcclain is a 65 year old woman with multiple medical problems including stage Ia right breast cancer status post neoadjuvant TCHP chemotherapy (completed on 02/29/2020) and adjuvant Herceptin, following her lumpectomy, which she completed February 2022.  Patient seen in [facility] yesterday for myalgias thought secondary to Zometa.   Observations/Objective: Today, patient reports that she is feeling better.  She is having fewer myalgias and overall feels improved since yesterday.  No significant changes or concerns noted today.  Patient is continuing to take acetaminophen 3 times daily.  She also took Claritin last night.  Discussed importance of p.o. fluids and rest.  Assessment and Plan: Myalgias -most likely secondary to Zometa.  Patient feels like she is improving daily.  Recommend continued supportive care and I suggested that patient call the clinic with any changes or concerns.  Follow Up Instructions: As needed.  Patient scheduled to see Dr. Janese Banks on 9/9   I discussed the assessment and treatment plan with the patient. The patient was provided an opportunity to ask questions and all were answered. The patient agreed with the plan and demonstrated an understanding of the instructions.   The patient was advised to call back or seek an in-person evaluation if the symptoms worsen or if the  condition fails to improve as anticipated.  I provided 5 minutes of non-face-to-face time during this encounter.   Irean Hong, NP

## 2021-06-28 ENCOUNTER — Encounter: Payer: Self-pay | Admitting: Oncology

## 2021-07-08 ENCOUNTER — Ambulatory Visit: Payer: Medicare Other | Admitting: Radiation Oncology

## 2021-07-22 ENCOUNTER — Ambulatory Visit
Admission: RE | Admit: 2021-07-22 | Discharge: 2021-07-22 | Disposition: A | Discharge status: Home / Self Care | Payer: Medicare Other | Source: Ambulatory Visit | Attending: Radiation Oncology | Admitting: Radiation Oncology

## 2021-07-22 ENCOUNTER — Other Ambulatory Visit: Payer: Self-pay

## 2021-07-22 ENCOUNTER — Encounter: Payer: Self-pay | Admitting: Radiation Oncology

## 2021-07-22 VITALS — BP 123/64 | HR 79 | Temp 97.8°F | Resp 19 | Wt 209.5 lb

## 2021-07-22 DIAGNOSIS — Z170 Estrogen receptor positive status [ER+]: Secondary | ICD-10-CM

## 2021-07-22 DIAGNOSIS — C50111 Malignant neoplasm of central portion of right female breast: Secondary | ICD-10-CM

## 2021-07-22 NOTE — Progress Notes (Signed)
Radiation Oncology Follow up Note  Name: Christina Mcclain   Date:   07/22/2021 MRN:  [id number] DOB: [birth year]    This 65 y.o. female presents to the clinic today for 1 year follow-up status post whole breast radiation to her right breast for triple positive invasive mammary carcinoma.  REFERRING PROVIDER: Kirk Ruths, MD  HPI: Patient is a 65 year old female now out 1 year having completed whole breast radiation to her right breast for triple positive invasive mammary carcinoma status post wide local excision.  She undergone neoadjuvant chemotherapy.  Seen today in routine follow-up she is doing well.  She specifically denies breast tenderness cough or bone pain.  Still some slight incisional tenderness most likely related to scar tissue in the right breast..  She had mammograms back in May which I reviewed were BI-RADS 2 benign.  She is currently on letrozole tolerating that well without side effect.  COMPLICATIONS OF TREATMENT: none  FOLLOW UP COMPLIANCE: keeps appointments   PHYSICAL EXAM:  BP 123/64   Pulse 79   Temp 97.8 F (36.6 C)   Resp 19   Wt 209 lb 8 oz (95 kg)   SpO2 98%   BMI 38.32 kg/m  Lungs are clear to A&P cardiac examination essentially unremarkable with regular rate and rhythm. No dominant mass or nodularity is noted in either breast in 2 positions examined. Incision is well-healed. No axillary or supraclavicular adenopathy is appreciated. Cosmetic result is excellent.  Well-developed well-nourished patient in NAD. HEENT reveals PERLA, EOMI, discs not visualized.  Oral cavity is clear. No oral mucosal lesions are identified. Neck is clear without evidence of cervical or supraclavicular adenopathy. Lungs are clear to A&P. Cardiac examination is essentially unremarkable with regular rate and rhythm without murmur rub or thrill. Abdomen is benign with no organomegaly or masses noted. Motor sensory and DTR levels are equal and symmetric in the upper and lower  extremities. Cranial nerves II through XII are grossly intact. Proprioception is intact. No peripheral adenopathy or edema is identified. No motor or sensory levels are noted. Crude visual fields are within normal range.  RADIOLOGY RESULTS: Mammograms reviewed compatible with above-stated findings  PLAN: Present time patient is doing well 1 year out with no evidence of disease.  On pleased with her overall progress.  She continues on letrozole without side effect.  I have asked to see her back in 1 year for follow-up.  Patient knows to call with any concerns.  I would like to take this opportunity to thank you for allowing me to participate in the care of your patient.Noreene Filbert, MD

## 2021-07-23 ENCOUNTER — Encounter: Payer: Self-pay | Admitting: Oncology

## 2021-08-01 ENCOUNTER — Other Ambulatory Visit: Payer: Self-pay | Admitting: *Deleted

## 2021-08-01 DIAGNOSIS — D649 Anemia, unspecified: Secondary | ICD-10-CM

## 2021-08-01 DIAGNOSIS — C50919 Malignant neoplasm of unspecified site of unspecified female breast: Secondary | ICD-10-CM

## 2021-08-02 ENCOUNTER — Inpatient Hospital Stay: Payer: Medicare Other | Attending: Oncology

## 2021-08-02 ENCOUNTER — Encounter: Payer: Self-pay | Admitting: Oncology

## 2021-08-02 ENCOUNTER — Inpatient Hospital Stay (HOSPITAL_BASED_OUTPATIENT_CLINIC_OR_DEPARTMENT_OTHER): Payer: Medicare Other | Admitting: Oncology

## 2021-08-02 ENCOUNTER — Other Ambulatory Visit: Payer: Self-pay

## 2021-08-02 VITALS — BP 157/89 | HR 67 | Temp 96.6°F | Resp 20 | Wt 206.7 lb

## 2021-08-02 DIAGNOSIS — Z853 Personal history of malignant neoplasm of breast: Secondary | ICD-10-CM

## 2021-08-02 DIAGNOSIS — Z170 Estrogen receptor positive status [ER+]: Secondary | ICD-10-CM | POA: Insufficient documentation

## 2021-08-02 DIAGNOSIS — C50919 Malignant neoplasm of unspecified site of unspecified female breast: Secondary | ICD-10-CM

## 2021-08-02 DIAGNOSIS — Z79899 Other long term (current) drug therapy: Secondary | ICD-10-CM | POA: Insufficient documentation

## 2021-08-02 DIAGNOSIS — Z08 Encounter for follow-up examination after completed treatment for malignant neoplasm: Secondary | ICD-10-CM

## 2021-08-02 DIAGNOSIS — Z79811 Long term (current) use of aromatase inhibitors: Secondary | ICD-10-CM

## 2021-08-02 DIAGNOSIS — Z9221 Personal history of antineoplastic chemotherapy: Secondary | ICD-10-CM | POA: Diagnosis not present

## 2021-08-02 DIAGNOSIS — D649 Anemia, unspecified: Secondary | ICD-10-CM

## 2021-08-02 DIAGNOSIS — Z923 Personal history of irradiation: Secondary | ICD-10-CM | POA: Diagnosis not present

## 2021-08-02 DIAGNOSIS — C50911 Malignant neoplasm of unspecified site of right female breast: Secondary | ICD-10-CM | POA: Diagnosis present

## 2021-08-02 DIAGNOSIS — E538 Deficiency of other specified B group vitamins: Secondary | ICD-10-CM

## 2021-08-02 DIAGNOSIS — G63 Polyneuropathy in diseases classified elsewhere: Secondary | ICD-10-CM

## 2021-08-02 DIAGNOSIS — Z95828 Presence of other vascular implants and grafts: Secondary | ICD-10-CM

## 2021-08-02 DIAGNOSIS — R5383 Other fatigue: Secondary | ICD-10-CM

## 2021-08-02 LAB — CBC WITH DIFFERENTIAL/PLATELET
Abs Immature Granulocytes: 0.01 10*3/uL (ref 0.00–0.07)
Basophils Absolute: 0.1 10*3/uL (ref 0.0–0.1)
Basophils Relative: 1 %
Eosinophils Absolute: 0.1 10*3/uL (ref 0.0–0.5)
Eosinophils Relative: 2 %
HCT: 35.7 % — ABNORMAL LOW (ref 36.0–46.0)
Hemoglobin: 10.7 g/dL — ABNORMAL LOW (ref 12.0–15.0)
Immature Granulocytes: 0 %
Lymphocytes Relative: 44 %
Lymphs Abs: 1.8 10*3/uL (ref 0.7–4.0)
MCH: 25.1 pg — ABNORMAL LOW (ref 26.0–34.0)
MCHC: 30 g/dL (ref 30.0–36.0)
MCV: 83.8 fL (ref 80.0–100.0)
Monocytes Absolute: 0.6 10*3/uL (ref 0.1–1.0)
Monocytes Relative: 13 %
Neutro Abs: 1.7 10*3/uL (ref 1.7–7.7)
Neutrophils Relative %: 40 %
Platelets: 163 10*3/uL (ref 150–400)
RBC: 4.26 MIL/uL (ref 3.87–5.11)
RDW: 16.5 % — ABNORMAL HIGH (ref 11.5–15.5)
WBC: 4.3 10*3/uL (ref 4.0–10.5)
nRBC: 0 % (ref 0.0–0.2)

## 2021-08-02 LAB — COMPREHENSIVE METABOLIC PANEL
ALT: 14 U/L (ref 0–44)
AST: 20 U/L (ref 15–41)
Albumin: 4.1 g/dL (ref 3.5–5.0)
Alkaline Phosphatase: 86 U/L (ref 38–126)
Anion gap: 5 (ref 5–15)
BUN: 39 mg/dL — ABNORMAL HIGH (ref 8–23)
CO2: 28 mmol/L (ref 22–32)
Calcium: 9.4 mg/dL (ref 8.9–10.3)
Chloride: 106 mmol/L (ref 98–111)
Creatinine, Ser: 1.46 mg/dL — ABNORMAL HIGH (ref 0.44–1.00)
GFR, Estimated: 39 mL/min — ABNORMAL LOW (ref >60)
Glucose, Bld: 95 mg/dL (ref 70–99)
Potassium: 4.8 mmol/L (ref 3.5–5.1)
Sodium: 139 mmol/L (ref 135–145)
Total Bilirubin: 0.6 mg/dL (ref 0.3–1.2)
Total Protein: 7.6 g/dL (ref 6.5–8.1)

## 2021-08-02 LAB — FERRITIN: Ferritin: 55 ng/mL (ref 11–307)

## 2021-08-02 LAB — IRON AND TIBC
Iron: 55 ug/dL (ref 28–170)
Saturation Ratios: 16 % (ref 10.4–31.8)
TIBC: 343 ug/dL (ref 250–450)
UIBC: 288 ug/dL

## 2021-08-02 LAB — VITAMIN B12: Vitamin B-12: 1225 pg/mL — ABNORMAL HIGH (ref 180–914)

## 2021-08-02 MED ORDER — HEPARIN SOD (PORK) LOCK FLUSH 100 UNIT/ML IV SOLN
500.0000 [IU] | Freq: Once | INTRAVENOUS | Status: AC
  Filled 2021-08-02: qty 5

## 2021-08-02 MED ORDER — SODIUM CHLORIDE 0.9% FLUSH
10.0000 mL | Freq: Once | INTRAVENOUS | Status: AC
Start: 2021-08-02 — End: 2021-08-02
  Administered 2021-08-02: 10 mL via INTRAVENOUS
  Filled 2021-08-02: qty 10

## 2021-08-02 MED ORDER — HEPARIN SOD (PORK) LOCK FLUSH 100 UNIT/ML IV SOLN
INTRAVENOUS | Status: AC
  Administered 2021-08-02: 500 [IU] via INTRAVENOUS
  Filled 2021-08-02: qty 5

## 2021-08-02 NOTE — Progress Notes (Signed)
Patient states breast have been sore.

## 2021-08-03 ENCOUNTER — Encounter: Payer: Self-pay | Admitting: Oncology

## 2021-08-03 NOTE — Progress Notes (Signed)
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 It showed 2 suspicious lesions in the right breast. Diagnostic mammogram and ultrasound confirmed 2 masses in the 9 o'clock position of the right breast measuring 1.1 x 1.2 x 1.2 cm and the other one measuring 0.8 x 0.8 x 0.9 cm. These 2 masses were 1.7 cm apart. Sonographic evaluation of the right axilla did not show any enlarged adenopathy. 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    Baseline MUGA scan showed EF of 58%.  MRI showed two distinct masses in the right breast 1.5 cm and 1.2 cm 2 cm apart no additional suspicious masses or abnormal enhancement identified.  No suspicious adenopathy   6 cycles of neoadjuvant TCHP chemotherapy completed on 02/29/2020.    Final pathology showed residual tumor of 5 mm.Negative margins.  1 sentinel lymph node negative for malignancy.  Overall cancer cellularity 1%.  Percentage of cancer that is in situ 0%.  [id number]   Patient is started on adjuvant Kadcyla on  04/26/2020.  Treatment complicated by thrombocytopenia despite dose reduction. Patient switched to adjuvant Herceptin. Perjeta not continued due to significant diarrhea in the neoadjuvant setting.  Patient completed adjuvant Herceptin in February 2022  Interval history-patient had low-grade fever as well as chills following her first Zometa infusion which lasted for about 4 to 5 days.She is tolerating letrozole well without any significant hot flashes.  She has some baseline fatigue.  Denies any significant joint pain.  Reports soreness in her right breast.  ECOG PS- 1 Pain scale- 0  Review of systems- Review of Systems  Constitutional:  Positive for malaise/fatigue. Negative for chills, fever and weight loss.  HENT:  Negative for congestion, ear discharge and nosebleeds.   Eyes:  Negative for blurred vision.  Respiratory:  Negative for cough, hemoptysis, sputum production, shortness of breath and wheezing.   Cardiovascular:  Negative for chest pain, palpitations, orthopnea and claudication.  Gastrointestinal:  Negative for abdominal pain, blood in stool, constipation, diarrhea, heartburn, melena, nausea and vomiting.  Genitourinary:  Negative for dysuria, flank pain, frequency, hematuria and urgency.  Musculoskeletal:  Negative for back pain, joint pain and myalgias.  Skin:  Negative for rash.  Neurological:  Negative for dizziness, tingling, focal weakness, seizures, weakness and headaches.  Endo/Heme/Allergies:  Does not bruise/bleed easily.  Psychiatric/Behavioral:  Negative for depression and suicidal ideas. The patient does not have insomnia.       Allergies  Allergen Reactions   Amlodipine Other (See Comments)    edema   Statins Rash    Other reaction(s): Muscle Pain Possible hives also     Past Medical History:  Diagnosis Date   Adverse effect of anesthesia    hard to awaken   Anemia    Arthritis    Asthma    Asthma without status asthmaticus    unspecified; Take advair  each night   Breast cancer ([place]) 2021   right breast IMC   Bronchitis    Chronic kidney disease    stage III per dr Holley Raring   Constipation    Diabetes ([place]) 2010   Diabetes mellitus type 2, uncomplicated ([place])    Diabetes mellitus without complication ([place])    Encounter for blood transfusion    hysterectomy 9-10 years ago   Heart murmur    unspecified   Hyperlipidemia    Hypertension 1991   Hypothyroid    Multiple thyroid nodules    Neuromuscular disorder (H[facility])    neuropathy   Osteoarthritis of right hip 02/04/2017   Palpitations    Pernicious anemia    Personal history of chemotherapy 2020-2021   Right breast ca   Personal history of radiation therapy 2021   right breast [facility]   Pneumonia    Sleep apnea    Use C-PAP; can't use mouth piece. Anxiety   Thyroid disease    nodules   Thyroid nodule    x2   Urinary urgency      Past Surgical History:  Procedure Laterality Date   ABDOMINAL HYSTERECTOMY  2011   partial   ABDOMINAL SURGERY     BREAST BIOPSY Right 10/02/2020   affirm bx, x marker, fat necrosis   BREAST BIOPSY Right 10/19/2019   two masses at 9:00 [place] bx, coil marker and venus marker, IMC for both   BREAST LUMPECTOMY Right 03/28/2020   bracketing of two IMC masses at 9:00.  Venus marker was pushed medially during wire loc. Remains in breast.  Clear surgical margins. Negative Lymph node   CARPAL TUNNEL RELEASE Left 2008   CARPAL TUNNEL RELEASE Right    CHOLECYSTECTOMY  1982   COLONOSCOPY WITH PROPOFOL N/A 10/05/2017   Procedure: COLONOSCOPY WITH PROPOFOL;  Surgeon: Manya Silvas, MD;  Location: [facility] ENDOSCOPY;  Service: Endoscopy;  Laterality: N/A;   DIAGNOSTIC LAPAROSCOPY     JOINT REPLACEMENT Right 02/09/2017   TOTAL HIP REPLACEMENT, DR. CARTER. [place]   KNEE ARTHROSCOPY  1990s   PARTIAL MASTECTOMY WITH NEEDLE LOCALIZATION AND AXILLARY SENTINEL LYMPH NODE BX Right 03/28/2020   Procedure: PARTIAL MASTECTOMY WITH NEEDLE LOCALIZATION AND AXILLARY  SENTINEL LYMPH NODE BX;  Surgeon: Herbert Pun, MD;  Location: [facility] ORS;  Service: General;  Laterality: Right;   PORTACATH PLACEMENT Left 11/07/2019   Procedure: INSERTION PORT-A-CATH;  Surgeon: Herbert Pun, MD;  Location: [facility] ORS;  Service: General;  Laterality: Left;   TOTAL HIP ARTHROPLASTY Left 06/23/2017   Procedure: TOTAL HIP ARTHROPLASTY ANTERIOR APPROACH;  Surgeon: Hessie Knows, MD;  Location: [facility] ORS;  Service: Orthopedics;  Laterality: Left;    Social History   Socioeconomic History   Marital status: Married    Spouse name: Not on file   Number of children: 2   Years of education: some college   Highest education level: Not on file  Occupational History   Occupation: retired    Comment: [employer]  Tobacco Use   Smoking status: Never   Smokeless tobacco: Never  Vaping Use   Vaping Use: Never used  Substance and Sexual Activity   Alcohol use: No   Drug use: No   Sexual activity: Not Currently  Other Topics Concern  Not on file  Social History Narrative   Not on file   Social Determinants of Health   Financial Resource Strain: Not on file  Food Insecurity: Not on file  Transportation Needs: Not on file  Physical Activity: Not on file  Stress: Not on file  Social Connections: Not on file  Intimate Partner Violence: Not on file    Family History  Problem Relation Age of Onset   Heart disease Mother    Hypertension Mother    Arrhythmia Mother    Heart attack Mother    Diabetes Mother    Hypertension Father    Parkinson's disease Father    Coronary artery disease Maternal Uncle    Heart attack Maternal Uncle    Diabetes Sister    Diabetes Brother    Heart disease Brother    Breast cancer Neg Hx      Current Outpatient Medications:    acetaminophen (TYLENOL) 500 MG tablet, Take 500 mg by mouth every 6 (six) hours as needed., Disp: , Rfl:    CALCIUM CITRATE PO, Take 1,000 mg by mouth daily., Disp: , Rfl:     carvedilol (COREG) 25 MG tablet, Take 25 mg by mouth 2 (two) times daily with a meal. , Disp: , Rfl:    cholecalciferol (VITAMIN D) 1000 units tablet, Take 2,000 Units by mouth daily. , Disp: , Rfl:    Fluticasone-Salmeterol (ADVAIR) 250-50 MCG/DOSE AEPB, Inhale 1 puff into the lungs 2 (two) times daily. , Disp: , Rfl:    letrozole (FEMARA) 2.5 MG tablet, Take 1 tablet (2.5 mg total) by mouth daily., Disp: 90 tablet, Rfl: 2   lidocaine-prilocaine (EMLA) cream, Apply 1 application topically as needed., Disp: 30 g, Rfl: 0   loratadine (CLARITIN) 10 MG tablet, Take 10 mg by mouth daily as needed for allergies. , Disp: , Rfl:    magnesium oxide (MAG-OX) 400 MG tablet, Take 2 tablets by mouth daily., Disp: , Rfl:    metFORMIN (GLUCOPHAGE-XR) 500 MG 24 hr tablet, Take 500 mg by mouth daily with supper. , Disp: , Rfl:    pregabalin (LYRICA) 25 MG capsule, Take 25 mg by mouth 2 (two) times daily., Disp: , Rfl:    solifenacin (VESICARE) 5 MG tablet, Take 10 mg by mouth every evening. , Disp: , Rfl:    spironolactone (ALDACTONE) 25 MG tablet, Take 25 mg by mouth 2 (two) times daily., Disp: , Rfl:    traMADol (ULTRAM) 50 MG tablet, Take 1 tablet (50 mg total) by mouth every 6 (six) hours as needed., Disp: 30 tablet, Rfl: 0   vitamin B-12 (CYANOCOBALAMIN) 1000 MCG tablet, Take 1,000 mcg by mouth daily., Disp: , Rfl:    albuterol (PROVENTIL HFA;VENTOLIN HFA) 108 (90 Base) MCG/ACT inhaler, Inhale 2 puffs into the lungs every 6 (six) hours as needed for wheezing or shortness of breath. (Patient not taking: No sig reported), Disp: , Rfl:    nystatin (MYCOSTATIN/NYSTOP) powder, Apply 1 application topically 3 (three) times daily. (Patient not taking: No sig reported), Disp: 60 g, Rfl: 2   telmisartan-hydrochlorothiazide (MICARDIS HCT) 80-25 MG tablet, Take 1 tablet by mouth every morning.  (Patient not taking: No sig reported), Disp: , Rfl:  No current facility-administered medications for this  visit.  Facility-Administered Medications Ordered in Other Visits:    sodium chloride flush (NS) 0.9 % injection 10 mL, 10 mL, Intravenous, PRN, Sindy Guadeloupe, MD, 10 mL at 01/05/20 1344   sodium chloride flush (NS) 0.9 % injection  10 mL, 10 mL, Intravenous, PRN, Sindy Guadeloupe, MD, 10 mL at 05/17/20 0858  Physical exam:  Vitals:   08/02/21 1005  BP: (!) 157/89  Pulse: 67  Resp: 20  Temp: (!) 96.6 F (35.9 C)  SpO2: 100%  Weight: 206 lb 11.2 oz (93.8 kg)   Physical Exam Constitutional:      General: She is not in acute distress. Cardiovascular:     Rate and Rhythm: Normal rate and regular rhythm.     Heart sounds: Normal heart sounds.  Pulmonary:     Effort: Pulmonary effort is normal.     Breath sounds: Normal breath sounds.  Skin:    General: Skin is warm and dry.  Neurological:     Mental Status: She is alert and oriented to person, place, and time.    Breast exam was performed in seated and lying down position. Patient is status post right lumpectomy with a well-healed surgical scar. No evidence of any palpable masses. No evidence of axillary adenopathy. No evidence of any palpable masses or lumps in the left breast. No evidence of leftt axillary adenopathy    CMP Latest Ref Rng & Units 08/02/2021  Glucose 70 - 99 mg/dL 95  BUN 8 - 23 mg/dL 39(H)  Creatinine 0.44 - 1.00 mg/dL 1.46(H)  Sodium 135 - 145 mmol/L 139  Potassium 3.5 - 5.1 mmol/L 4.8  Chloride 98 - 111 mmol/L 106  CO2 22 - 32 mmol/L 28  Calcium 8.9 - 10.3 mg/dL 9.4  Total Protein 6.5 - 8.1 g/dL 7.6  Total Bilirubin 0.3 - 1.2 mg/dL 0.6  Alkaline Phos 38 - 126 U/L 86  AST 15 - 41 U/L 20  ALT 0 - 44 U/L 14   CBC Latest Ref Rng & Units 08/02/2021  WBC 4.0 - 10.5 K/uL 4.3  Hemoglobin 12.0 - 15.0 g/dL 10.7(L)  Hematocrit 36.0 - 46.0 % 35.7(L)  Platelets 150 - 400 K/uL 163     Assessment and plan- Patient is a 65 y.o. female with invasive mammary carcinoma of the right breast clinical prognostic stage Ia  mc T1 ccN0 cM0 ER/PR positive HER-2/neu positive.  She is s/p 6 cycles of neoadjuvant TCHP chemotherapy with residual 5 mm tumor.  She had persistent cytopenias and thrombocytopenia with Kadcyla and was switched to single agent maintenance Herceptin which she completed in February 2022.  Clinically patient is doing well with no concerning signs and symptoms of recurrence based on today's exam.  Patient likely has postoperative pain and not palpate any concerning masses on today's exam.  She would be due for a mammogram in May of next year and her mammogram from May 2022 was unrevealing.She is currently on letrozole for ER positive disease but she will continue along with calcium and vitamin D.  She would be due for Zometa again in February 2022 and I will plan to give her some IV fluids along with pain medications 1 or 2 days later after Zometa given her side effects with the first dose.  Patient still has a port in place and will continue getting port flushes every 3 months   Visit Diagnosis 1. Encounter for follow-up surveillance of breast cancer   2. Use of letrozole (Femara)      Dr. Randa Evens, MD, MPH [facility] [phone] 08/03/2021 2:35 PM

## 2021-08-09 ENCOUNTER — Encounter: Payer: Self-pay | Admitting: *Deleted

## 2021-08-09 ENCOUNTER — Encounter
Admission: RE | Disposition: A | Discharge status: Home / Self Care | Payer: Self-pay | Source: Home / Self Care | Attending: Gastroenterology

## 2021-08-09 ENCOUNTER — Ambulatory Visit: Payer: Medicare Other | Admitting: Anesthesiology

## 2021-08-09 ENCOUNTER — Ambulatory Visit
Admission: RE | Admit: 2021-08-09 | Discharge: 2021-08-09 | Disposition: A | Discharge status: Home / Self Care | Payer: Medicare Other | Attending: Gastroenterology | Admitting: Gastroenterology

## 2021-08-09 DIAGNOSIS — Z6837 Body mass index (BMI) 37.0-37.9, adult: Secondary | ICD-10-CM | POA: Diagnosis not present

## 2021-08-09 DIAGNOSIS — Z923 Personal history of irradiation: Secondary | ICD-10-CM | POA: Insufficient documentation

## 2021-08-09 DIAGNOSIS — Z888 Allergy status to other drugs, medicaments and biological substances status: Secondary | ICD-10-CM | POA: Insufficient documentation

## 2021-08-09 DIAGNOSIS — Z7951 Long term (current) use of inhaled steroids: Secondary | ICD-10-CM | POA: Insufficient documentation

## 2021-08-09 DIAGNOSIS — R197 Diarrhea, unspecified: Secondary | ICD-10-CM | POA: Diagnosis not present

## 2021-08-09 DIAGNOSIS — C50911 Malignant neoplasm of unspecified site of right female breast: Secondary | ICD-10-CM | POA: Diagnosis not present

## 2021-08-09 DIAGNOSIS — K640 First degree hemorrhoids: Secondary | ICD-10-CM | POA: Insufficient documentation

## 2021-08-09 DIAGNOSIS — Z79899 Other long term (current) drug therapy: Secondary | ICD-10-CM | POA: Diagnosis not present

## 2021-08-09 DIAGNOSIS — K2100 Gastro-esophageal reflux disease with esophagitis, without bleeding: Secondary | ICD-10-CM | POA: Insufficient documentation

## 2021-08-09 DIAGNOSIS — Z7984 Long term (current) use of oral hypoglycemic drugs: Secondary | ICD-10-CM | POA: Insufficient documentation

## 2021-08-09 DIAGNOSIS — R634 Abnormal weight loss: Secondary | ICD-10-CM | POA: Insufficient documentation

## 2021-08-09 DIAGNOSIS — Z9049 Acquired absence of other specified parts of digestive tract: Secondary | ICD-10-CM | POA: Insufficient documentation

## 2021-08-09 DIAGNOSIS — Z09 Encounter for follow-up examination after completed treatment for conditions other than malignant neoplasm: Secondary | ICD-10-CM | POA: Insufficient documentation

## 2021-08-09 DIAGNOSIS — Z9221 Personal history of antineoplastic chemotherapy: Secondary | ICD-10-CM | POA: Insufficient documentation

## 2021-08-09 DIAGNOSIS — Z79811 Long term (current) use of aromatase inhibitors: Secondary | ICD-10-CM | POA: Insufficient documentation

## 2021-08-09 DIAGNOSIS — Z86010 Personal history of colonic polyps: Secondary | ICD-10-CM | POA: Insufficient documentation

## 2021-08-09 DIAGNOSIS — Z96643 Presence of artificial hip joint, bilateral: Secondary | ICD-10-CM | POA: Diagnosis not present

## 2021-08-09 DIAGNOSIS — K219 Gastro-esophageal reflux disease without esophagitis: Secondary | ICD-10-CM | POA: Insufficient documentation

## 2021-08-09 HISTORY — PX: COLONOSCOPY WITH PROPOFOL: SHX5780

## 2021-08-09 HISTORY — PX: ESOPHAGOGASTRODUODENOSCOPY (EGD) WITH PROPOFOL: SHX5813

## 2021-08-09 LAB — GLUCOSE, CAPILLARY: Glucose-Capillary: 79 mg/dL (ref 70–99)

## 2021-08-09 SURGERY — ESOPHAGOGASTRODUODENOSCOPY (EGD) WITH PROPOFOL
Anesthesia: General

## 2021-08-09 MED ORDER — SODIUM CHLORIDE 0.9 % IV SOLN
INTRAVENOUS | Status: DC
  Administered 2021-08-09: 20 mL/h via INTRAVENOUS

## 2021-08-09 MED ORDER — PROPOFOL 10 MG/ML IV BOLUS
INTRAVENOUS | Status: DC | PRN
  Administered 2021-08-09: 80 mg via INTRAVENOUS
  Administered 2021-08-09: 10 mg via INTRAVENOUS

## 2021-08-09 MED ORDER — LIDOCAINE HCL (PF) 2 % IJ SOLN
INTRAMUSCULAR | Status: AC
  Filled 2021-08-09: qty 5

## 2021-08-09 MED ORDER — DEXMEDETOMIDINE (PRECEDEX) IN NS 20 MCG/5ML (4 MCG/ML) IV SYRINGE
PREFILLED_SYRINGE | INTRAVENOUS | Status: DC | PRN
  Administered 2021-08-09: 8 ug via INTRAVENOUS

## 2021-08-09 MED ORDER — PROPOFOL 500 MG/50ML IV EMUL
INTRAVENOUS | Status: DC | PRN
  Administered 2021-08-09: 150 ug/kg/min via INTRAVENOUS

## 2021-08-09 MED ORDER — PHENYLEPHRINE HCL (PRESSORS) 10 MG/ML IV SOLN
INTRAVENOUS | Status: DC | PRN
  Administered 2021-08-09: 100 ug via INTRAVENOUS

## 2021-08-09 MED ORDER — PROPOFOL 500 MG/50ML IV EMUL
INTRAVENOUS | Status: AC
  Filled 2021-08-09: qty 50

## 2021-08-09 MED ORDER — LIDOCAINE HCL (CARDIAC) PF 100 MG/5ML IV SOSY
PREFILLED_SYRINGE | INTRAVENOUS | Status: DC | PRN
  Administered 2021-08-09: 40 mg via INTRAVENOUS

## 2021-08-09 MED ORDER — GLYCOPYRROLATE 0.2 MG/ML IJ SOLN
INTRAMUSCULAR | Status: AC
  Filled 2021-08-09: qty 1

## 2021-08-09 NOTE — Op Note (Signed)
[facility] Gastroenterology Patient Name: Christina Mcclain Procedure Date: 08/09/2021 9:39 AM MRN: [id number] Account #: [id number] Date of Birth: 09/26/1956 Admit Type: Outpatient Age: 65 Room: [facility] ENDO ROOM 1 Gender: Female Note Status: Finalized Instrument Name: Colonoscope [id number] Procedure:             Colonoscopy Indications:           High risk colon cancer surveillance: Personal history                         of colonic polyps Providers:             Annamaria Helling DO, DO Medicines:             Monitored Anesthesia Care Complications:         No immediate complications. Estimated blood loss: None. Procedure:             Pre-Anesthesia Assessment:                        - Prior to the procedure, a History and Physical was                         performed, and patient medications and allergies were                         reviewed. The patient is competent. The risks and                         benefits of the procedure and the sedation options and                         risks were discussed with the patient. All questions                         were answered and informed consent was obtained.                         Patient identification and proposed procedure were                         verified by the physician, the nurse, the anesthetist                         and the technician in the endoscopy suite. Mental                         Status Examination: alert and oriented. Airway                         Examination: normal oropharyngeal airway and neck                         mobility. Respiratory Examination: clear to                         auscultation. CV Examination: RRR, no murmurs, no S3                         or S4. Prophylactic  Antibiotics: The patient does not                         require prophylactic antibiotics. Prior                         Anticoagulants: The patient has taken no previous                          anticoagulant or antiplatelet agents. ASA Grade                         Assessment: III - A patient with severe systemic                         disease. After reviewing the risks and benefits, the                         patient was deemed in satisfactory condition to                         undergo the procedure. The anesthesia plan was to use                         monitored anesthesia care (MAC). Immediately prior to                         administration of medications, the patient was                         re-assessed for adequacy to receive sedatives. The                         heart rate, respiratory rate, oxygen saturations,                         blood pressure, adequacy of pulmonary ventilation, and                         response to care were monitored throughout the                         procedure. The physical status of the patient was                         re-assessed after the procedure.                        After obtaining informed consent, the colonoscope was                         passed under direct vision. Throughout the procedure,                         the patient's blood pressure, pulse, and oxygen                         saturations were monitored continuously. The  Colonoscope was introduced through the anus and                         advanced to the the terminal ileum, with                         identification of the appendiceal orifice and IC                         valve. The colonoscopy was performed without                         difficulty. The patient tolerated the procedure well.                         The quality of the bowel preparation was evaluated                         using the BBPS [facility] Bowel Preparation Scale) with                         scores of: Right Colon = 3, Transverse Colon = 3 and                         Left Colon = 3 (entire mucosa seen well with no                         residual staining,  small fragments of stool or opaque                         liquid). The total BBPS score equals 9. The terminal                         ileum, ileocecal valve, appendiceal orifice, and                         rectum were photographed. Findings:      The perianal and digital rectal examinations were normal. Pertinent       negatives include normal sphincter tone.      The terminal ileum appeared normal.      Non-bleeding internal hemorrhoids were found during retroflexion. The       hemorrhoids were Grade I (internal hemorrhoids that do not prolapse).       Estimated blood loss: none.      The entire examined colon appeared normal on direct and retroflexion       views. Impression:            - The examined portion of the ileum was normal.                        - Non-bleeding internal hemorrhoids.                        - The entire examined colon is normal on direct and                         retroflexion views.                        -  No specimens collected. Recommendation:        - Discharge patient to home.                        - Resume previous diet.                        - Continue present medications.                        - Repeat colonoscopy in 5 years for surveillance.                        - Return to referring physician as previously                         scheduled. Procedure Code(s):     --- Professional ---                        C6237, Colorectal cancer screening; colonoscopy on                         individual at high risk Diagnosis Code(s):     --- Professional ---                        Z86.010, Personal history of colonic polyps                        K64.0, First degree hemorrhoids CPT copyright 2019 American Medical Association. All rights reserved. The codes documented in this report are preliminary and upon coder review may  be revised to meet current compliance requirements. Attending Participation:      I personally performed the entire  procedure. Volney American, DO Annamaria Helling DO, DO 08/09/2021 10:38:29 AM This report has been signed electronically. Number of Addenda: 0 Note Initiated On: 08/09/2021 9:39 AM Scope Withdrawal Time: 0 hours 10 minutes 50 seconds  Total Procedure Duration: 0 hours 14 minutes 57 seconds  Estimated Blood Loss:  Estimated blood loss: none.      [facility]

## 2021-08-09 NOTE — Anesthesia Preprocedure Evaluation (Signed)
Anesthesia Evaluation  Patient identified by MRN, date of birth, ID band Patient awake    Reviewed: Allergy & Precautions, NPO status , Patient's Chart, lab work & pertinent test results  History of Anesthesia Complications (+) PROLONGED EMERGENCE and history of anesthetic complications  Airway Mallampati: II  TM Distance: >3 FB Neck ROM: Full    Dental  (+) Missing   Pulmonary asthma , sleep apnea ,    breath sounds clear to auscultation- rhonchi (-) wheezing      Cardiovascular hypertension, Pt. on medications (-) CAD, (-) Past MI, (-) Cardiac Stents and (-) CABG  Rhythm:Regular Rate:Normal - Systolic murmurs and - Diastolic murmurs    Neuro/Psych  Headaches, neg Seizures negative psych ROS   GI/Hepatic Neg liver ROS, GERD  ,  Endo/Other  diabetes, Oral Hypoglycemic AgentsHypothyroidism   Renal/GU Renal InsufficiencyRenal disease     Musculoskeletal  (+) Arthritis ,   Abdominal (+) + obese,   Peds  Hematology  (+) anemia ,   Anesthesia Other Findings Past Medical History: No date: Adverse effect of anesthesia     Comment:  hard to awaken No date: Anemia No date: Arthritis No date: Asthma No date: Asthma without status asthmaticus     Comment:  unspecified; Take advair each night 2021: Breast cancer ([place])     Comment:  right breast IMC No date: Bronchitis No date: Chronic kidney disease     Comment:  stage III per dr Holley Raring No date: Constipation 2010: Diabetes ([place]) No date: Diabetes mellitus type 2, uncomplicated ([place]) No date: Diabetes mellitus without complication ([place]) No date: Encounter for blood transfusion     Comment:  hysterectomy 9-10 years ago No date: Heart murmur     Comment:  unspecified No date: Hyperlipidemia 1991: Hypertension No date: Hypothyroid No date: Multiple thyroid nodules No date: Neuromuscular disorder ([place])     Comment:  neuropathy 02/04/2017: Osteoarthritis of right  hip No date: Palpitations No date: Pernicious anemia 2020-2021: Personal history of chemotherapy     Comment:  Right breast ca 2021: Personal history of radiation therapy     Comment:  right breast IMC No date: Pneumonia No date: Sleep apnea     Comment:  Use C-PAP; can't use mouth piece. Anxiety No date: Thyroid disease     Comment:  nodules No date: Thyroid nodule     Comment:  x2 No date: Urinary urgency   Reproductive/Obstetrics                             Anesthesia Physical Anesthesia Plan  ASA: 3  Anesthesia Plan: General   Post-op Pain Management:    Induction: Intravenous  PONV Risk Score and Plan: 2 and Propofol infusion  Airway Management Planned: Natural Airway  Additional Equipment:   Intra-op Plan:   Post-operative Plan:   Informed Consent: I have reviewed the patients History and Physical, chart, labs and discussed the procedure including the risks, benefits and alternatives for the proposed anesthesia with the patient or authorized representative who has indicated his/her understanding and acceptance.     Dental advisory given  Plan Discussed with: CRNA and Anesthesiologist  Anesthesia Plan Comments:         Anesthesia Quick Evaluation

## 2021-08-09 NOTE — H&P (Signed)
[facility] Pre-Procedure H&P   Patient ID: Christina Mcclain is a 65 y.o. female.  Gastroenterology Provider: Annamaria Helling, DO  Referring Provider: Laurine Blazer, PA PCP: Perrin Maltese, MD  Date: 08/09/2021  HPI Ms. Christina Mcclain is a 65 y.o. female who presents today for Esophagogastroduodenoscopy and Colonoscopy for acid reflux, diarrhea, and weight loss; and surveillance colonoscopy fr h/o polyps respectively.  Reflux on occasion that she takes medication prn. No dysphagia or odynophagia. Some noted weight loss.  Pt c Breast cancer treated with chemo and completed in 02/2020- had diarrhea and weight loss which improved with finishing chemotherapy. No blood in stool or melena.  Reflux for which she takes prn medications. Hgb 10.2. normal iron studies.  2015 and 2018 colonoscopy with adenomatous polyps.  S/p chole and hysto.  Bilateral hip replacement. No fhx of gi malignancy/dz.  Past Medical History:  Diagnosis Date   Adverse effect of anesthesia    hard to awaken   Anemia    Arthritis    Asthma    Asthma without status asthmaticus    unspecified; Take advair each night   Breast cancer ([place]) 2021   right breast IMC   Bronchitis    Chronic kidney disease    stage III per dr Holley Raring   Constipation    Diabetes ([place]) 2010   Diabetes mellitus type 2, uncomplicated ([place])    Diabetes mellitus without complication ([place])    Encounter for blood transfusion    hysterectomy 9-10 years ago   Heart murmur    unspecified   Hyperlipidemia    Hypertension 1991   Hypothyroid    Multiple thyroid nodules    Neuromuscular disorder (H[facility])    neuropathy   Osteoarthritis of right hip 02/04/2017   Palpitations    Pernicious anemia    Personal history of chemotherapy 2020-2021   Right breast ca   Personal history of radiation therapy 2021   right breast [facility]   Pneumonia    Sleep apnea    Use C-PAP; can't use mouth piece. Anxiety   Thyroid disease     nodules   Thyroid nodule    x2   Urinary urgency     Past Surgical History:  Procedure Laterality Date   ABDOMINAL HYSTERECTOMY  2011   partial   ABDOMINAL SURGERY     BREAST BIOPSY Right 10/02/2020   affirm bx, x marker, fat necrosis   BREAST BIOPSY Right 10/19/2019   two masses at 9:00 [place] bx, coil marker and venus marker, IMC for both   BREAST LUMPECTOMY Right 03/28/2020   bracketing of two IMC masses at 9:00.  Venus marker was pushed medially during wire loc. Remains in breast.  Clear surgical margins. Negative Lymph node   CARPAL TUNNEL RELEASE Left 2008   CARPAL TUNNEL RELEASE Right    CHOLECYSTECTOMY  1982   COLONOSCOPY WITH PROPOFOL N/A 10/05/2017   Procedure: COLONOSCOPY WITH PROPOFOL;  Surgeon: Manya Silvas, MD;  Location: [facility] ENDOSCOPY;  Service: Endoscopy;  Laterality: N/A;   DIAGNOSTIC LAPAROSCOPY     JOINT REPLACEMENT Right 02/09/2017   TOTAL HIP REPLACEMENT, DR. CARTER. [place]   KNEE ARTHROSCOPY  1990s   PARTIAL MASTECTOMY WITH NEEDLE LOCALIZATION AND AXILLARY SENTINEL LYMPH NODE BX Right 03/28/2020   Procedure: PARTIAL MASTECTOMY WITH NEEDLE LOCALIZATION AND AXILLARY SENTINEL LYMPH NODE BX;  Surgeon: Herbert Pun, MD;  Location: [facility] ORS;  Service: General;  Laterality: Right;   PORTACATH PLACEMENT Left 11/07/2019   Procedure: INSERTION PORT-A-CATH;  Surgeon: Herbert Pun, MD;  Location: [facility] ORS;  Service: General;  Laterality: Left;   TOTAL HIP ARTHROPLASTY Left 06/23/2017   Procedure: TOTAL HIP ARTHROPLASTY ANTERIOR APPROACH;  Surgeon: Hessie Knows, MD;  Location: [facility] ORS;  Service: Orthopedics;  Laterality: Left;    Family History No h/o GI disease or malignancy  Review of Systems  Constitutional:  Negative for activity change, appetite change, fatigue and fever.  HENT:  Negative for trouble swallowing and voice change.   Respiratory:  Negative for shortness of breath and wheezing.   Cardiovascular:  Negative for chest pain and  palpitations.  Gastrointestinal:  Negative for abdominal distention, abdominal pain, anal bleeding, blood in stool, constipation, diarrhea, nausea, rectal pain and vomiting.  Musculoskeletal:  Negative for arthralgias and myalgias.  Skin:  Negative for color change and pallor.  Neurological:  Negative for dizziness, syncope and weakness.  Psychiatric/Behavioral:  Negative for confusion.   All other systems reviewed and are negative.   Medications Current Facility-Administered Medications on File Prior to Encounter  Medication Dose Route Frequency Provider Last Rate Last Admin   sodium chloride flush (NS) 0.9 % injection 10 mL  10 mL Intravenous PRN Sindy Guadeloupe, MD   10 mL at 01/05/20 1344   sodium chloride flush (NS) 0.9 % injection 10 mL  10 mL Intravenous PRN Sindy Guadeloupe, MD   10 mL at 05/17/20 [id number]   Current Outpatient Medications on File Prior to Encounter  Medication Sig Dispense Refill   acetaminophen (TYLENOL) 500 MG tablet Take 500 mg by mouth every 6 (six) hours as needed.     CALCIUM CITRATE PO Take 1,000 mg by mouth daily.     carvedilol (COREG) 25 MG tablet Take 25 mg by mouth 2 (two) times daily with a meal.      cholecalciferol (VITAMIN D) 1000 units tablet Take 2,000 Units by mouth daily.      Fluticasone-Salmeterol (ADVAIR) 250-50 MCG/DOSE AEPB Inhale 1 puff into the lungs 2 (two) times daily.      letrozole (FEMARA) 2.5 MG tablet Take 1 tablet (2.5 mg total) by mouth daily. 90 tablet 2   lidocaine-prilocaine (EMLA) cream Apply 1 application topically as needed. 30 g 0   loratadine (CLARITIN) 10 MG tablet Take 10 mg by mouth daily as needed for allergies.      magnesium oxide (MAG-OX) 400 MG tablet Take 2 tablets by mouth daily.     metFORMIN (GLUCOPHAGE-XR) 500 MG 24 hr tablet Take 500 mg by mouth daily with supper.      pregabalin (LYRICA) 25 MG capsule Take 25 mg by mouth 2 (two) times daily.     solifenacin (VESICARE) 5 MG tablet Take 10 mg by mouth every evening.       spironolactone (ALDACTONE) 25 MG tablet Take 25 mg by mouth 2 (two) times daily.     traMADol (ULTRAM) 50 MG tablet Take 1 tablet (50 mg total) by mouth every 6 (six) hours as needed. 30 tablet 0   vitamin B-12 (CYANOCOBALAMIN) 1000 MCG tablet Take 1,000 mcg by mouth daily.     albuterol (PROVENTIL HFA;VENTOLIN HFA) 108 (90 Base) MCG/ACT inhaler Inhale 2 puffs into the lungs every 6 (six) hours as needed for wheezing or shortness of breath. (Patient not taking: No sig reported)     nystatin (MYCOSTATIN/NYSTOP) powder Apply 1 application topically 3 (three) times daily. (Patient not taking: No sig reported) 60 g 2   telmisartan-hydrochlorothiazide (MICARDIS HCT) 80-25 MG tablet Take 1 tablet  by mouth every morning.  (Patient not taking: No sig reported)     [DISCONTINUED] prochlorperazine (COMPAZINE) 10 MG tablet Take 1 tablet (10 mg total) by mouth every 6 (six) hours as needed (Nausea or vomiting). 30 tablet 1    Pertinent medications related to GI and procedure were reviewed by me with the patient prior to the procedure   Current Facility-Administered Medications:    0.9 %  sodium chloride infusion, , Intravenous, Continuous, Annamaria Helling, DO, Last Rate: 20 mL/hr at 08/09/21 0903, 20 mL/hr at 08/09/21 [id number]  Facility-Administered Medications Ordered in Other Encounters:    sodium chloride flush (NS) 0.9 % injection 10 mL, 10 mL, Intravenous, PRN, Sindy Guadeloupe, MD, 10 mL at 01/05/20 1344   sodium chloride flush (NS) 0.9 % injection 10 mL, 10 mL, Intravenous, PRN, Sindy Guadeloupe, MD, 10 mL at 05/17/20 0858  sodium chloride 20 mL/hr (08/09/21 0903)       Allergies  Allergen Reactions   Amlodipine Other (See Comments)    edema   Statins Rash    Other reaction(s): Muscle Pain Possible hives also   Allergies were reviewed by me prior to the procedure  Objective    Vitals:   08/09/21 0848  BP: 123/73  Pulse: 72  Resp: 20  Temp: (!) 96.4 F (35.8 C)  TempSrc:  Temporal  SpO2: 100%  Weight: 93.9 kg  Height: '5\' 2"'$  (1.575 m)     Physical Exam Vitals reviewed.  Constitutional:      General: She is not in acute distress.    Appearance: Normal appearance. She is obese. She is not ill-appearing, toxic-appearing or diaphoretic.  HENT:     Head: Normocephalic and atraumatic.     Nose: Nose normal.     Mouth/Throat:     Mouth: Mucous membranes are moist.     Pharynx: Oropharynx is clear.  Eyes:     General: No scleral icterus.    Extraocular Movements: Extraocular movements intact.  Cardiovascular:     Rate and Rhythm: Normal rate and regular rhythm.     Heart sounds: Normal heart sounds. No murmur heard.   No friction rub. No gallop.  Pulmonary:     Effort: Pulmonary effort is normal. No respiratory distress.     Breath sounds: Normal breath sounds. No wheezing, rhonchi or rales.  Abdominal:     General: Bowel sounds are normal. There is no distension.     Palpations: Abdomen is soft.     Tenderness: There is no abdominal tenderness. There is no guarding or rebound.  Musculoskeletal:     Cervical back: Neck supple.     Right lower leg: No edema.     Left lower leg: No edema.  Skin:    General: Skin is warm and dry.     Coloration: Skin is not jaundiced or pale.  Neurological:     General: No focal deficit present.     Mental Status: She is alert and oriented to person, place, and time. Mental status is at baseline.  Psychiatric:        Mood and Affect: Mood normal.        Behavior: Behavior normal.        Thought Content: Thought content normal.        Judgment: Judgment normal.     Assessment:  Ms. Rosalyn Laur is a 65 y.o. female  who presents today for Esophagogastroduodenoscopy and Colonoscopy for acid reflux, diarrhea, and weight loss; and surveillance  colonoscopy fr h/o polyps respectively.  Plan:  Esophagogastroduodenoscopy and Colonoscopy with possible intervention today  Esophagogastroduodenoscopy and  colonoscopy with possible biopsy, control of bleeding, polypectomy, and interventions as necessary has been discussed with the patient/patient representative. Informed consent was obtained from the patient/patient representative after explaining the indication, nature, and risks of the procedure including but not limited to death, bleeding, perforation, missed neoplasm/lesions, cardiorespiratory compromise, and reaction to medications. Opportunity for questions was given and appropriate answers were provided. Patient/patient representative has verbalized understanding is amenable to undergoing the procedure.   Annamaria Helling, DO  [facility] Gastroenterology  Portions of the record may have been created with voice recognition software. Occasional wrong-word or 'sound-a-like' substitutions may have occurred due to the inherent limitations of voice recognition software.  Read the chart carefully and recognize, using context, where substitutions may have occurred.

## 2021-08-09 NOTE — Anesthesia Procedure Notes (Addendum)
Date/Time: 08/09/2021 10:07 AM Performed by: Doreen Salvage, CRNA Pre-anesthesia Checklist: Patient identified, Emergency Drugs available, Suction available and Patient being monitored Patient Re-evaluated:Patient Re-evaluated prior to induction Oxygen Delivery Method: Supernova nasal CPAP Induction Type: IV induction Dental Injury: Teeth and Oropharynx as per pre-operative assessment  Comments: Nasal cannula with etCO2 monitoring

## 2021-08-09 NOTE — Transfer of Care (Signed)
Immediate Anesthesia Transfer of Care Note  Patient: Christina Mcclain  Procedure(s) Performed: ESOPHAGOGASTRODUODENOSCOPY (EGD) WITH PROPOFOL COLONOSCOPY WITH PROPOFOL  Patient Location: PACU and Endoscopy Unit  Anesthesia Type:General  Level of Consciousness: awake and oriented  Airway & Oxygen Therapy: Patient Spontanous Breathing  Post-op Assessment: Report given to RN and Post -op Vital signs reviewed and stable  Post vital signs: Reviewed and stable  Last Vitals:  Vitals Value Taken Time  BP 94/53 08/09/21 1034  Temp 35.8 C 08/09/21 1030  Pulse 75 08/09/21 1037  Resp 12 08/09/21 1037  SpO2 98 % 08/09/21 1037  Vitals shown include unvalidated device data.  Last Pain:  Vitals:   08/09/21 1030  TempSrc: Temporal         Complications: No notable events documented.

## 2021-08-09 NOTE — Op Note (Signed)
[facility] Gastroenterology Patient Name: Christina Mcclain Procedure Date: 08/09/2021 9:39 AM MRN: [id number] Account #: [id number] Date of Birth: 03-Jan-1956 Admit Type: Outpatient Age: 65 Room: [facility] ENDO ROOM 1 Gender: Female Note Status: Finalized Instrument Name: Upper Endoscope [id number] Procedure:             Upper GI endoscopy Indications:           Suspected esophageal reflux Providers:             Annamaria Helling DO, DO Medicines:             Monitored Anesthesia Care Complications:         No immediate complications. Estimated blood loss:                         Minimal. Procedure:             Pre-Anesthesia Assessment:                        - Prior to the procedure, a History and Physical was                         performed, and patient medications and allergies were                         reviewed. The patient is competent. The risks and                         benefits of the procedure and the sedation options and                         risks were discussed with the patient. All questions                         were answered and informed consent was obtained.                         Patient identification and proposed procedure were                         verified by the physician, the nurse, the anesthetist                         and the technician in the endoscopy suite. Mental                         Status Examination: alert and oriented. Airway                         Examination: normal oropharyngeal airway and neck                         mobility. Respiratory Examination: clear to                         auscultation. CV Examination: RRR, no murmurs, no S3                         or S4. Prophylactic Antibiotics: The patient does  not                         require prophylactic antibiotics. Prior                         Anticoagulants: The patient has taken no previous                         anticoagulant or antiplatelet agents.  ASA Grade                         Assessment: III - A patient with severe systemic                         disease. After reviewing the risks and benefits, the                         patient was deemed in satisfactory condition to                         undergo the procedure. The anesthesia plan was to use                         monitored anesthesia care (MAC). Immediately prior to                         administration of medications, the patient was                         re-assessed for adequacy to receive sedatives. The                         heart rate, respiratory rate, oxygen saturations,                         blood pressure, adequacy of pulmonary ventilation, and                         response to care were monitored throughout the                         procedure. The physical status of the patient was                         re-assessed after the procedure.                        After obtaining informed consent, the endoscope was                         passed under direct vision. Throughout the procedure,                         the patient's blood pressure, pulse, and oxygen                         saturations were monitored continuously. The Endoscope  was introduced through the mouth, and advanced to the                         second part of duodenum. The upper GI endoscopy was                         accomplished without difficulty. The patient tolerated                         the procedure well. Findings:      The duodenal bulb, first portion of the duodenum and second portion of       the duodenum were normal. Estimated blood loss: none.      The entire examined stomach was normal. Biopsies were taken with a cold       forceps for Helicobacter pylori testing. Estimated blood loss was       minimal.      The Z-line was regular and was found 38 cm from the incisors.      Esophagogastric landmarks were identified: the gastroesophageal  junction       was found at 38 cm from the incisors.      Non-severe esophagitis with no bleeding was found 30 to 38 cm from the       incisors. Estimated blood loss: none.      The exam was otherwise without abnormality. Impression:            - Normal duodenal bulb, first portion of the duodenum                         and second portion of the duodenum.                        - Normal stomach. Biopsied.                        - Z-line regular, 38 cm from the incisors.                        - Esophagogastric landmarks identified.                        - Non-severe reflux esophagitis with no bleeding.                        - The examination was otherwise normal. Recommendation:        - Discharge patient to home.                        - Resume previous diet.                        - Continue present medications.                        - Follow an antireflux regimen.                        - Use a proton pump inhibitor PO daily for reflux  symptoms.                        - Await pathology results.                        - Return to referring physician. Procedure Code(s):     --- Professional ---                        [phone], Esophagogastroduodenoscopy, flexible,                         transoral; with biopsy, single or multiple Diagnosis Code(s):     --- Professional ---                        K21.00, Gastro-esophageal reflux disease with                         esophagitis, without bleeding CPT copyright 2019 American Medical Association. All rights reserved. The codes documented in this report are preliminary and upon coder review may  be revised to meet current compliance requirements. Attending Participation:      I personally performed the entire procedure. Volney American, DO Annamaria Helling DO, DO 08/09/2021 10:35:30 AM This report has been signed electronically. Number of Addenda: 0 Note Initiated On: 08/09/2021 9:39 AM Estimated Blood Loss:   Estimated blood loss was minimal.      [facility]

## 2021-08-09 NOTE — Interval H&P Note (Signed)
History and Physical Interval Note: Preprocedure H&P from 08/09/21  was reviewed and there was no interval change after seeing and examining the patient.  Written consent was obtained from the patient after discussion of risks, benefits, and alternatives. Patient has consented to proceed with Esophagogastroduodenoscopy and Colonoscopy with possible intervention   08/09/2021 9:55 AM  Christina Mcclain  has presented today for surgery, with the diagnosis of prs hx aden polyps anemia.  The various methods of treatment have been discussed with the patient and family. After consideration of risks, benefits and other options for treatment, the patient has consented to  Procedure(s): ESOPHAGOGASTRODUODENOSCOPY (EGD) WITH PROPOFOL (N/A) COLONOSCOPY WITH PROPOFOL (N/A) as a surgical intervention.  The patient's history has been reviewed, patient examined, no change in status, stable for surgery.  I have reviewed the patient's chart and labs.  Questions were answered to the patient's satisfaction.     Annamaria Helling

## 2021-08-09 NOTE — Anesthesia Postprocedure Evaluation (Signed)
Anesthesia Post Note  Patient: Talena Mazure  Procedure(s) Performed: ESOPHAGOGASTRODUODENOSCOPY (EGD) WITH PROPOFOL COLONOSCOPY WITH PROPOFOL  Patient location during evaluation: Endoscopy Anesthesia Type: General Level of consciousness: awake and alert and oriented Pain management: pain level controlled Vital Signs Assessment: post-procedure vital signs reviewed and stable Respiratory status: spontaneous breathing, nonlabored ventilation and respiratory function stable Cardiovascular status: blood pressure returned to baseline and stable Postop Assessment: no signs of nausea or vomiting Anesthetic complications: no   No notable events documented.   Last Vitals:  Vitals:   08/09/21 0848 08/09/21 1030  BP: 123/73 (!) 94/53  Pulse: 72 77  Resp: 20 12  Temp: (!) 35.8 C (!) 35.8 C  SpO2: 100% 98%    Last Pain:  Vitals:   08/09/21 1030  TempSrc: Temporal                 Karmen Altamirano

## 2021-08-12 LAB — SURGICAL PATHOLOGY

## 2021-09-13 ENCOUNTER — Other Ambulatory Visit: Payer: Self-pay | Admitting: Internal Medicine

## 2021-09-13 DIAGNOSIS — Z853 Personal history of malignant neoplasm of breast: Secondary | ICD-10-CM

## 2021-10-01 ENCOUNTER — Other Ambulatory Visit: Payer: Self-pay | Admitting: Oncology

## 2021-10-07 ENCOUNTER — Ambulatory Visit
Admission: RE | Admit: 2021-10-07 | Discharge: 2021-10-07 | Disposition: A | Discharge status: Home / Self Care | Payer: Medicare Other | Source: Ambulatory Visit | Attending: Internal Medicine | Admitting: Internal Medicine

## 2021-10-07 ENCOUNTER — Other Ambulatory Visit: Payer: Self-pay

## 2021-10-07 DIAGNOSIS — Z853 Personal history of malignant neoplasm of breast: Secondary | ICD-10-CM | POA: Insufficient documentation

## 2021-10-15 ENCOUNTER — Other Ambulatory Visit: Payer: Self-pay | Admitting: Oncology

## 2021-10-16 ENCOUNTER — Encounter: Payer: Self-pay | Admitting: Oncology

## 2021-10-16 MED ORDER — TRAMADOL HCL 50 MG PO TABS: 50.0000 mg | ORAL_TABLET | Freq: Four times a day (QID) | ORAL | 0 refills | Status: DC | PRN

## 2021-10-29 ENCOUNTER — Other Ambulatory Visit: Payer: Self-pay

## 2021-10-29 ENCOUNTER — Inpatient Hospital Stay: Payer: Medicare Other | Attending: Oncology

## 2021-10-29 DIAGNOSIS — Z452 Encounter for adjustment and management of vascular access device: Secondary | ICD-10-CM | POA: Diagnosis not present

## 2021-10-29 DIAGNOSIS — C50911 Malignant neoplasm of unspecified site of right female breast: Secondary | ICD-10-CM | POA: Diagnosis present

## 2021-10-29 DIAGNOSIS — Z170 Estrogen receptor positive status [ER+]: Secondary | ICD-10-CM | POA: Diagnosis not present

## 2021-10-29 DIAGNOSIS — Z95828 Presence of other vascular implants and grafts: Secondary | ICD-10-CM

## 2021-10-29 MED ORDER — SODIUM CHLORIDE 0.9% FLUSH
10.0000 mL | INTRAVENOUS | Status: DC | PRN
  Administered 2021-10-29: 10 mL via INTRAVENOUS
  Filled 2021-10-29: qty 10

## 2021-10-29 MED ORDER — HEPARIN SOD (PORK) LOCK FLUSH 100 UNIT/ML IV SOLN
500.0000 [IU] | Freq: Once | INTRAVENOUS | Status: AC
Start: 2021-10-29 — End: 2021-10-29
  Administered 2021-10-29: 500 [IU] via INTRAVENOUS
  Filled 2021-10-29: qty 5

## 2021-12-03 ENCOUNTER — Telehealth: Payer: Self-pay | Admitting: Oncology

## 2021-12-03 NOTE — Telephone Encounter (Signed)
Pt callled to reschedule her appt for 2-10. Call back at [phone]

## 2022-01-03 ENCOUNTER — Ambulatory Visit: Payer: Medicare Other

## 2022-01-03 ENCOUNTER — Other Ambulatory Visit: Payer: Medicare Other

## 2022-01-03 ENCOUNTER — Ambulatory Visit: Payer: Medicare Other | Admitting: Oncology

## 2022-01-05 ENCOUNTER — Other Ambulatory Visit: Payer: Self-pay | Admitting: Oncology

## 2022-01-06 ENCOUNTER — Ambulatory Visit: Payer: Medicare Other

## 2022-01-15 ENCOUNTER — Inpatient Hospital Stay: Payer: Medicare Other | Attending: Oncology

## 2022-01-15 ENCOUNTER — Other Ambulatory Visit: Payer: Self-pay

## 2022-01-15 ENCOUNTER — Inpatient Hospital Stay (HOSPITAL_BASED_OUTPATIENT_CLINIC_OR_DEPARTMENT_OTHER): Payer: Medicare Other | Admitting: Oncology

## 2022-01-15 ENCOUNTER — Inpatient Hospital Stay: Payer: Medicare Other

## 2022-01-15 ENCOUNTER — Telehealth: Payer: Self-pay | Admitting: *Deleted

## 2022-01-15 ENCOUNTER — Encounter: Payer: Self-pay | Admitting: Oncology

## 2022-01-15 ENCOUNTER — Other Ambulatory Visit: Payer: Self-pay | Admitting: *Deleted

## 2022-01-15 VITALS — BP 117/60 | HR 60 | Temp 96.0°F | Resp 17 | Wt 223.0 lb

## 2022-01-15 DIAGNOSIS — Z79811 Long term (current) use of aromatase inhibitors: Secondary | ICD-10-CM | POA: Diagnosis not present

## 2022-01-15 DIAGNOSIS — N189 Chronic kidney disease, unspecified: Secondary | ICD-10-CM | POA: Diagnosis not present

## 2022-01-15 DIAGNOSIS — Z853 Personal history of malignant neoplasm of breast: Secondary | ICD-10-CM

## 2022-01-15 DIAGNOSIS — C50919 Malignant neoplasm of unspecified site of unspecified female breast: Secondary | ICD-10-CM

## 2022-01-15 DIAGNOSIS — Z95828 Presence of other vascular implants and grafts: Secondary | ICD-10-CM

## 2022-01-15 DIAGNOSIS — C50911 Malignant neoplasm of unspecified site of right female breast: Secondary | ICD-10-CM | POA: Diagnosis not present

## 2022-01-15 DIAGNOSIS — Z170 Estrogen receptor positive status [ER+]: Secondary | ICD-10-CM | POA: Insufficient documentation

## 2022-01-15 DIAGNOSIS — Z08 Encounter for follow-up examination after completed treatment for malignant neoplasm: Secondary | ICD-10-CM | POA: Diagnosis not present

## 2022-01-15 LAB — CBC WITH DIFFERENTIAL/PLATELET
Abs Immature Granulocytes: 0.02 10*3/uL (ref 0.00–0.07)
Basophils Absolute: 0 10*3/uL (ref 0.0–0.1)
Basophils Relative: 1 %
Eosinophils Absolute: 0.1 10*3/uL (ref 0.0–0.5)
Eosinophils Relative: 3 %
HCT: 34 % — ABNORMAL LOW (ref 36.0–46.0)
Hemoglobin: 10.1 g/dL — ABNORMAL LOW (ref 12.0–15.0)
Immature Granulocytes: 0 %
Lymphocytes Relative: 46 %
Lymphs Abs: 2.2 10*3/uL (ref 0.7–4.0)
MCH: 25.6 pg — ABNORMAL LOW (ref 26.0–34.0)
MCHC: 29.7 g/dL — ABNORMAL LOW (ref 30.0–36.0)
MCV: 86.3 fL (ref 80.0–100.0)
Monocytes Absolute: 0.5 10*3/uL (ref 0.1–1.0)
Monocytes Relative: 10 %
Neutro Abs: 2 10*3/uL (ref 1.7–7.7)
Neutrophils Relative %: 40 %
Platelets: 180 10*3/uL (ref 150–400)
RBC: 3.94 MIL/uL (ref 3.87–5.11)
RDW: 17.3 % — ABNORMAL HIGH (ref 11.5–15.5)
WBC: 4.8 10*3/uL (ref 4.0–10.5)
nRBC: 0 % (ref 0.0–0.2)

## 2022-01-15 LAB — COMPREHENSIVE METABOLIC PANEL
ALT: 19 U/L (ref 0–44)
AST: 17 U/L (ref 15–41)
Albumin: 3.7 g/dL (ref 3.5–5.0)
Alkaline Phosphatase: 86 U/L (ref 38–126)
Anion gap: 8 (ref 5–15)
BUN: 37 mg/dL — ABNORMAL HIGH (ref 8–23)
CO2: 28 mmol/L (ref 22–32)
Calcium: 9.8 mg/dL (ref 8.9–10.3)
Chloride: 102 mmol/L (ref 98–111)
Creatinine, Ser: 1.79 mg/dL — ABNORMAL HIGH (ref 0.44–1.00)
GFR, Estimated: 31 mL/min — ABNORMAL LOW (ref >60)
Glucose, Bld: 107 mg/dL — ABNORMAL HIGH (ref 70–99)
Potassium: 4.2 mmol/L (ref 3.5–5.1)
Sodium: 138 mmol/L (ref 135–145)
Total Bilirubin: 0.2 mg/dL — ABNORMAL LOW (ref 0.3–1.2)
Total Protein: 6.7 g/dL (ref 6.5–8.1)

## 2022-01-15 MED ORDER — HEPARIN SOD (PORK) LOCK FLUSH 100 UNIT/ML IV SOLN
500.0000 [IU] | Freq: Once | INTRAVENOUS | Status: AC
  Administered 2022-01-15: 500 [IU] via INTRAVENOUS
  Filled 2022-01-15: qty 5

## 2022-01-15 NOTE — Telephone Encounter (Signed)
I put in referral and faxed a  order form to dr. Windell Moment to get port removed. Also asked to call pt with date and time. Pt . Expecting to hear from surgeon office. Patient can call if needed. Fax transmission went through [phone]

## 2022-01-15 NOTE — Progress Notes (Signed)
Survivorship Care Plan visit completed.  Treatment summary reviewed and given to patient.  ASCO answers booklet reviewed and given to patient.  CARE program and Cancer Transitions discussed with patient along with other resources cancer center offers to patients and caregivers.  Patient verbalized understanding.    

## 2022-01-15 NOTE — Progress Notes (Signed)
Patient here for oncology follow-up appointment, concerns of SOB   °

## 2022-01-16 ENCOUNTER — Encounter: Payer: Self-pay | Admitting: Oncology

## 2022-01-16 ENCOUNTER — Ambulatory Visit: Payer: Medicare Other

## 2022-01-16 NOTE — Progress Notes (Signed)
Hematology/Oncology Consult note [facility]  Telephone:([phone] [phone]  Patient Care Team: Perrin Maltese, MD as PCP - General (Internal Medicine) Theodore Demark, RN as Registered Nurse (Oncology) Sindy Guadeloupe, MD as Consulting Physician (Hematology and Oncology) Sindy Guadeloupe, MD as Consulting Physician (Hematology and Oncology) Sindy Guadeloupe, MD as Consulting Physician (Hematology and Oncology) Noreene Filbert, MD as Consulting Physician (Radiation Oncology) Herbert Pun, MD as Consulting Physician (General Surgery)   Name of the patient: Christina Mcclain  [id number]  [birth year]   Date of visit: 01/16/22  Diagnosis- 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 It showed 2 suspicious lesions in the right breast. Diagnostic mammogram and ultrasound confirmed 2 masses in the 9 o'clock position of the right breast measuring 1.1 x 1.2 x 1.2 cm and the other one measuring 0.8 x 0.8 x 0.9 cm. These 2 masses were 1.7 cm apart. Sonographic evaluation of the right axilla did not show any enlarged adenopathy. 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    Baseline MUGA scan showed EF of 58%.  MRI showed two distinct masses in the right breast 1.5 cm and 1.2 cm 2 cm apart no additional suspicious masses or abnormal enhancement identified.  No suspicious adenopathy   6 cycles of neoadjuvant TCHP chemotherapy completed on 02/29/2020.    Final pathology showed residual tumor of 5 mm.Negative margins.  1 sentinel lymph node negative  for malignancy.  Overall cancer cellularity 1%.  Percentage of cancer that is in situ 0%.  [id number]   Patient is started on adjuvant Kadcyla on 04/26/2020.  Treatment complicated by thrombocytopenia despite dose reduction. Patient switched to adjuvant Herceptin. Perjeta not continued due to significant diarrhea in the neoadjuvant setting.  Patient completed adjuvant Herceptin in February 2022  Interval history-patient had significant side effects including joint pains fatigue and fever-like symptoms after 1 dose of Zometa and does not wish to continue adjuvant Zometa further.  Otherwise tolerating letrozole well along with calcium and vitamin D.  Denies any breast concerns.  She is concerned about her weight gain.  Also follows up with nephrology for her chronic kidney disease.  ECOG PS- 1 Pain scale- 0   Review of systems- Review of Systems  Constitutional:  Positive for malaise/fatigue. Negative for chills, fever and weight loss.  HENT:  Negative for congestion, ear discharge and nosebleeds.   Eyes:  Negative for blurred vision.  Respiratory:  Negative for cough, hemoptysis, sputum production, shortness of breath and wheezing.   Cardiovascular:  Negative for chest pain, palpitations, orthopnea and claudication.  Gastrointestinal:  Negative for abdominal pain, blood in stool, constipation, diarrhea, heartburn, melena, nausea and vomiting.  Genitourinary:  Negative for dysuria, flank pain, frequency, hematuria and urgency.  Musculoskeletal:  Negative for back pain, joint pain and myalgias.  Skin:  Negative for rash.  Neurological:  Negative for dizziness, tingling, focal weakness, seizures, weakness and headaches.  Endo/Heme/Allergies:  Does not bruise/bleed easily.  Psychiatric/Behavioral:  Negative for depression and suicidal ideas. The patient does not have insomnia.      Allergies  Allergen Reactions  Amlodipine Other (See Comments)    edema   Statins Rash    Other reaction(s):  Muscle Pain Possible hives also     Past Medical History:  Diagnosis Date   Adverse effect of anesthesia    hard to awaken   Anemia    Arthritis    Asthma    Asthma without status asthmaticus    unspecified; Take advair each night   Breast cancer ([place]) 2021   right breast IMC   Bronchitis    Chronic kidney disease    stage III per dr Holley Raring   Constipation    Diabetes ([place]) 2010   Diabetes mellitus type 2, uncomplicated ([place])    Diabetes mellitus without complication ([place])    Encounter for blood transfusion    hysterectomy 9-10 years ago   Heart murmur    unspecified   Hyperlipidemia    Hypertension 1991   Hypothyroid    Multiple thyroid nodules    Neuromuscular disorder (H[facility])    neuropathy   Osteoarthritis of right hip 02/04/2017   Palpitations    Pernicious anemia    Personal history of chemotherapy 2020-2021   Right breast ca   Personal history of radiation therapy 2021   right breast [facility]   Pneumonia    Sleep apnea    Use C-PAP; can't use mouth piece. Anxiety   Thyroid disease    nodules   Thyroid nodule    x2   Urinary urgency      Past Surgical History:  Procedure Laterality Date   ABDOMINAL HYSTERECTOMY  2011   partial   ABDOMINAL SURGERY     BREAST BIOPSY Right 10/02/2020   affirm bx, x marker, fat necrosis   BREAST BIOPSY Right 10/19/2019   two masses at 9:00 [place] bx, coil marker and venus marker, IMC for both   BREAST LUMPECTOMY Right 03/28/2020   bracketing of two IMC masses at 9:00.  Venus marker was pushed medially during wire loc. Remains in breast.  Clear surgical margins. Negative Lymph node   CARPAL TUNNEL RELEASE Left 2008   CARPAL TUNNEL RELEASE Right    CHOLECYSTECTOMY  1982   COLONOSCOPY WITH PROPOFOL N/A 10/05/2017   Procedure: COLONOSCOPY WITH PROPOFOL;  Surgeon: Manya Silvas, MD;  Location: [facility] ENDOSCOPY;  Service: Endoscopy;  Laterality: N/A;   COLONOSCOPY WITH PROPOFOL N/A 08/09/2021   Procedure: COLONOSCOPY WITH PROPOFOL;   Surgeon: Annamaria Helling, DO;  Location: [facility] ENDOSCOPY;  Service: Gastroenterology;  Laterality: N/A;   DIAGNOSTIC LAPAROSCOPY     ESOPHAGOGASTRODUODENOSCOPY (EGD) WITH PROPOFOL N/A 08/09/2021   Procedure: ESOPHAGOGASTRODUODENOSCOPY (EGD) WITH PROPOFOL;  Surgeon: Annamaria Helling, DO;  Location: [facility] ENDOSCOPY;  Service: Gastroenterology;  Laterality: N/A;   JOINT REPLACEMENT Right 02/09/2017   TOTAL HIP REPLACEMENT, DR. CARTER. [place]   KNEE ARTHROSCOPY  1990s   PARTIAL MASTECTOMY WITH NEEDLE LOCALIZATION AND AXILLARY SENTINEL LYMPH NODE BX Right 03/28/2020   Procedure: PARTIAL MASTECTOMY WITH NEEDLE LOCALIZATION AND AXILLARY SENTINEL LYMPH NODE BX;  Surgeon: Herbert Pun, MD;  Location: [facility] ORS;  Service: General;  Laterality: Right;   PORTACATH PLACEMENT Left 11/07/2019   Procedure: INSERTION PORT-A-CATH;  Surgeon: Herbert Pun, MD;  Location: [facility] ORS;  Service: General;  Laterality: Left;   TOTAL HIP ARTHROPLASTY Left 06/23/2017   Procedure: TOTAL HIP ARTHROPLASTY ANTERIOR APPROACH;  Surgeon: Hessie Knows, MD;  Location: [facility] ORS;  Service: Orthopedics;  Laterality: Left;    Social History   Socioeconomic History   Marital status: Married  Spouse name: Not on file   Number of children: 2   Years of education: some college   Highest education level: Not on file  Occupational History   Occupation: retired    Comment: [place] Use   Smoking status: Never   Smokeless tobacco: Never  Scientific laboratory technician Use: Never used  Substance and Sexual Activity   Alcohol use: No   Drug use: No   Sexual activity: Not Currently  Other Topics Concern   Not on file  Social History Narrative   Not on file   Social Determinants of Health   Financial Resource Strain: Not on file  Food Insecurity: Not on file  Transportation Needs: Not on file  Physical Activity: Not on file  Stress: Not on file  Social Connections: Not on file   Intimate Partner Violence: Not on file    Family History  Problem Relation Age of Onset   Heart disease Mother    Hypertension Mother    Arrhythmia Mother    Heart attack Mother    Diabetes Mother    Hypertension Father    Parkinson's disease Father    Coronary artery disease Maternal Uncle    Heart attack Maternal Uncle    Diabetes Sister    Diabetes Brother    Heart disease Brother    Breast cancer Neg Hx      Current Outpatient Medications:    acetaminophen (TYLENOL) 500 MG tablet, Take 500 mg by mouth every 6 (six) hours as needed., Disp: , Rfl:    Calcium Carbonate-Vitamin D (CALCIUM 600+D PO), Take by mouth., Disp: , Rfl:    CALCIUM CITRATE PO, Take 1,000 mg by mouth daily., Disp: , Rfl:    carvedilol (COREG) 25 MG tablet, Take 25 mg by mouth 2 (two) times daily with a meal. , Disp: , Rfl:    cholecalciferol (VITAMIN D) 1000 units tablet, Take 2,000 Units by mouth daily. , Disp: , Rfl:    ezetimibe (ZETIA) 10 MG tablet, Take 10 mg by mouth daily., Disp: , Rfl:    FARXIGA 10 MG TABS tablet, Take 10 mg by mouth daily., Disp: , Rfl:    Fluticasone-Salmeterol (ADVAIR) 250-50 MCG/DOSE AEPB, Inhale 1 puff into the lungs 2 (two) times daily. , Disp: , Rfl:    letrozole (FEMARA) 2.5 MG tablet, TAKE 1 TABLET(2.5 MG) BY MOUTH DAILY, Disp: 90 tablet, Rfl: 2   lidocaine-prilocaine (EMLA) cream, APPLY 1 APPLICATION TOPICALLY AS NEEDED, Disp: 30 g, Rfl: 1   loratadine (CLARITIN) 10 MG tablet, Take 10 mg by mouth daily as needed for allergies. , Disp: , Rfl:    magnesium oxide (MAG-OX) 400 MG tablet, Take 2 tablets by mouth daily., Disp: , Rfl:    metFORMIN (GLUCOPHAGE-XR) 500 MG 24 hr tablet, Take 500 mg by mouth daily with supper. , Disp: , Rfl:    pantoprazole (PROTONIX) 40 MG tablet, , Disp: , Rfl:    pregabalin (LYRICA) 25 MG capsule, Take 25 mg by mouth 2 (two) times daily., Disp: , Rfl:    solifenacin (VESICARE) 5 MG tablet, Take 10 mg by mouth every evening. , Disp: , Rfl:     spironolactone (ALDACTONE) 25 MG tablet, Take 25 mg by mouth 2 (two) times daily., Disp: , Rfl:    traMADol (ULTRAM) 50 MG tablet, Take 1 tablet (50 mg total) by mouth every 6 (six) hours as needed., Disp: 30 tablet, Rfl: 0   vitamin B-12 (CYANOCOBALAMIN) 1000  MCG tablet, Take 1,000 mcg by mouth daily., Disp: , Rfl:    albuterol (PROVENTIL HFA;VENTOLIN HFA) 108 (90 Base) MCG/ACT inhaler, Inhale 2 puffs into the lungs every 6 (six) hours as needed for wheezing or shortness of breath. (Patient not taking: Reported on 06/05/2021), Disp: , Rfl:    nystatin (MYCOSTATIN/NYSTOP) powder, Apply 1 application topically 3 (three) times daily. (Patient not taking: Reported on 06/05/2021), Disp: 60 g, Rfl: 2   telmisartan-hydrochlorothiazide (MICARDIS HCT) 80-25 MG tablet, Take 1 tablet by mouth every morning.  (Patient not taking: Reported on 06/05/2021), Disp: , Rfl:  No current facility-administered medications for this visit.  Facility-Administered Medications Ordered in Other Visits:    sodium chloride flush (NS) 0.9 % injection 10 mL, 10 mL, Intravenous, PRN, Sindy Guadeloupe, MD, 10 mL at 01/05/20 1344   sodium chloride flush (NS) 0.9 % injection 10 mL, 10 mL, Intravenous, PRN, Sindy Guadeloupe, MD, 10 mL at 05/17/20 0858  Physical exam:  Vitals:   01/15/22 1358  BP: 117/60  Pulse: 60  Resp: 17  Temp: (!) 96 F (35.6 C)  TempSrc: Tympanic  SpO2: 96%  Weight: 223 lb (101.2 kg)   Physical Exam Constitutional:      General: She is not in acute distress. Cardiovascular:     Rate and Rhythm: Normal rate and regular rhythm.     Heart sounds: Normal heart sounds.  Pulmonary:     Effort: Pulmonary effort is normal.     Breath sounds: Normal breath sounds.  Abdominal:     General: Bowel sounds are normal.     Palpations: Abdomen is soft.  Skin:    General: Skin is warm and dry.  Neurological:     Mental Status: She is alert and oriented to person, place, and time.   Breast exam was performed in  seated and lying down position. Patient is status post right lumpectomy with a well-healed surgical scar. No evidence of any palpable masses. No evidence of axillary adenopathy. No evidence of any palpable masses or lumps in the left breast. No evidence of leftt axillary adenopathy   CMP Latest Ref Rng & Units 01/15/2022  Glucose 70 - 99 mg/dL 107(H)  BUN 8 - 23 mg/dL 37(H)  Creatinine 0.44 - 1.00 mg/dL 1.79(H)  Sodium 135 - 145 mmol/L 138  Potassium 3.5 - 5.1 mmol/L 4.2  Chloride 98 - 111 mmol/L 102  CO2 22 - 32 mmol/L 28  Calcium 8.9 - 10.3 mg/dL 9.8  Total Protein 6.5 - 8.1 g/dL 6.7  Total Bilirubin 0.3 - 1.2 mg/dL 0.2(L)  Alkaline Phos 38 - 126 U/L 86  AST 15 - 41 U/L 17  ALT 0 - 44 U/L 19   CBC Latest Ref Rng & Units 01/15/2022  WBC 4.0 - 10.5 K/uL 4.8  Hemoglobin 12.0 - 15.0 g/dL 10.1(L)  Hematocrit 36.0 - 46.0 % 34.0(L)  Platelets 150 - 400 K/uL 180    Assessment and plan- Patient is a 66 y.o. 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  She is s/p 6 cycles of neoadjuvant TCHP chemotherapy with residual 5 mm tumor.  She had persistent cytopenias and thrombocytopenia with Kadcyla and was switched to single agent maintenance Herceptin which she completed in February 2022.  This is a routine follow-up visit for breast cancer  Clinically patient is doing well with no concerning signs and symptoms of recurrence based on today's exam.  She will continueLetrozole along with calcium and vitamin D for 10 years.  Her baseline bone density scan in August 2021 was normal and she will need another one in August of this year.  Recent mammogram from November 2022 was also unremarkable.  Ideally patient needs Zometa every 3 months for 6 months for up to 3 years as per ASCO guidelines to reduce the risk of skeletal metastases and ER positive breast cancer.  However patient reports having significant side  effects from her first Zometa infusion and does not wish to continue any further.  We will therefore hold off on doing any further Zometa at this time.  I will see her back in 6 months no labs.  We will also plan for port removal at this time   Visit Diagnosis 1. Encounter for follow-up surveillance of breast cancer   2. Use of letrozole (Femara)      Dr. Randa Evens, MD, MPH [facility] at [facility] [phone] 01/16/2022 8:37 AM

## 2022-01-17 ENCOUNTER — Inpatient Hospital Stay: Payer: Medicare Other

## 2022-01-20 ENCOUNTER — Telehealth: Payer: Self-pay | Admitting: *Deleted

## 2022-01-20 NOTE — Telephone Encounter (Signed)
Pt has decided that she wants to try zometa 1  more time to see how she tolerates. I will let Janese Banks know and then [employer]. Will make an appt for her. She is agreeable to the plan.

## 2022-01-29 ENCOUNTER — Telehealth: Payer: Self-pay | Admitting: *Deleted

## 2022-01-29 NOTE — Telephone Encounter (Signed)
The patient is coming on 01/30/2022 for Zometa. The pt. Saw Dr. Janese Banks on 01/15/22 and pt had got a zometa infusion in the past and pt had a lot of achiness last lasted several days and pt did not want to take it any longer. Later the pt. Called back and decided to try the zometa 1 more time to see how it does this time. She was given an appt 01/30/2022 and she had labs on 2//22 and Dr. Janese Banks states that she does not need another lab for zometa. She is fine with the labs pt had 2 weeks ago. ?

## 2022-01-30 ENCOUNTER — Inpatient Hospital Stay: Payer: Medicare Other | Attending: Oncology

## 2022-01-30 ENCOUNTER — Other Ambulatory Visit: Payer: Self-pay

## 2022-01-30 VITALS — BP 137/53 | HR 65 | Temp 96.9°F | Resp 20

## 2022-01-30 DIAGNOSIS — C50919 Malignant neoplasm of unspecified site of unspecified female breast: Secondary | ICD-10-CM

## 2022-01-30 DIAGNOSIS — Z170 Estrogen receptor positive status [ER+]: Secondary | ICD-10-CM | POA: Diagnosis not present

## 2022-01-30 DIAGNOSIS — R52 Pain, unspecified: Secondary | ICD-10-CM | POA: Insufficient documentation

## 2022-01-30 DIAGNOSIS — C50911 Malignant neoplasm of unspecified site of right female breast: Secondary | ICD-10-CM | POA: Diagnosis present

## 2022-01-30 DIAGNOSIS — Z79811 Long term (current) use of aromatase inhibitors: Secondary | ICD-10-CM | POA: Insufficient documentation

## 2022-01-30 MED ORDER — HEPARIN SOD (PORK) LOCK FLUSH 100 UNIT/ML IV SOLN
500.0000 [IU] | Freq: Once | INTRAVENOUS | Status: AC | PRN
  Administered 2022-01-30: 09:00:00 500 [IU]
  Filled 2022-01-30: qty 5

## 2022-01-30 MED ORDER — SODIUM CHLORIDE 0.9 % IV SOLN
Freq: Once | INTRAVENOUS | Status: AC
  Filled 2022-01-30: qty 250

## 2022-01-30 MED ORDER — ZOLEDRONIC ACID 4 MG/100ML IV SOLN: 4.0000 mg | INTRAVENOUS | Status: DC

## 2022-01-30 MED ORDER — ZOLEDRONIC ACID 4 MG/100ML IV SOLN
3.3000 mg | Freq: Once | INTRAVENOUS | Status: AC
  Administered 2022-01-30: 09:00:00 4 mg via INTRAVENOUS
  Filled 2022-01-30: qty 100

## 2022-01-30 MED ORDER — SODIUM CHLORIDE 0.9% FLUSH
10.0000 mL | Freq: Once | INTRAVENOUS | Status: AC | PRN
  Administered 2022-01-30: 08:00:00 10 mL
  Filled 2022-01-30: qty 10

## 2022-01-30 NOTE — Patient Instructions (Signed)

## 2022-01-31 ENCOUNTER — Inpatient Hospital Stay: Payer: Medicare Other

## 2022-01-31 VITALS — BP 131/72 | HR 62 | Temp 96.8°F | Resp 18

## 2022-01-31 DIAGNOSIS — C50911 Malignant neoplasm of unspecified site of right female breast: Secondary | ICD-10-CM | POA: Diagnosis not present

## 2022-01-31 DIAGNOSIS — M791 Myalgia, unspecified site: Secondary | ICD-10-CM

## 2022-01-31 MED ORDER — HEPARIN SOD (PORK) LOCK FLUSH 100 UNIT/ML IV SOLN
500.0000 [IU] | Freq: Once | INTRAVENOUS | Status: AC
  Administered 2022-01-31: 500 [IU] via INTRAVENOUS
  Filled 2022-01-31: qty 5

## 2022-01-31 MED ORDER — MORPHINE SULFATE (PF) 2 MG/ML IV SOLN: 4.0000 mg | Freq: Once | INTRAVENOUS | Status: DC

## 2022-01-31 MED ORDER — SODIUM CHLORIDE 0.9 % IV SOLN
Freq: Once | INTRAVENOUS | Status: AC
  Filled 2022-01-31: qty 250

## 2022-01-31 MED ORDER — KETOROLAC TROMETHAMINE 15 MG/ML IJ SOLN
15.0000 mg | Freq: Once | INTRAMUSCULAR | Status: AC
  Administered 2022-01-31: 15 mg via INTRAVENOUS
  Filled 2022-01-31: qty 1

## 2022-01-31 MED ORDER — SODIUM CHLORIDE 0.9% FLUSH
10.0000 mL | Freq: Once | INTRAVENOUS | Status: AC
  Administered 2022-01-31: 10 mL via INTRAVENOUS
  Filled 2022-01-31: qty 10

## 2022-01-31 NOTE — Progress Notes (Signed)
Pt took 2nd zometa treatment yesterday. With initial infusion pt experienced severe pain, weakness. This time pt states it doesn't seem as bad. I told her our plan for today IVFs and morphine for pain. She said she definitely doesn't need something as strong as morphine for pain. Spoke to MD, Toradol '15mg'$  IV ordered instead. Pt very agreeable to this medication. Pt states she is cancelling request for port removal as she will be taking future zometa treatments. Discharged to home. Feeling better. ?

## 2022-02-03 ENCOUNTER — Other Ambulatory Visit: Payer: Self-pay

## 2022-02-03 ENCOUNTER — Encounter: Payer: Medicare Other | Attending: Internal Medicine

## 2022-02-03 DIAGNOSIS — E119 Type 2 diabetes mellitus without complications: Secondary | ICD-10-CM | POA: Insufficient documentation

## 2022-02-03 DIAGNOSIS — C50919 Malignant neoplasm of unspecified site of unspecified female breast: Secondary | ICD-10-CM | POA: Insufficient documentation

## 2022-02-03 NOTE — Progress Notes (Signed)
Spoke with patient regarding CARE program: Completed phone call interview and all questions answered. Scheduled to come in tomorrow, 02/03/22 for 6MWT and in-person orientation. ?

## 2022-02-04 ENCOUNTER — Other Ambulatory Visit: Payer: Self-pay

## 2022-02-04 VITALS — Ht 62.0 in | Wt 220.1 lb

## 2022-02-04 DIAGNOSIS — C50919 Malignant neoplasm of unspecified site of unspecified female breast: Secondary | ICD-10-CM

## 2022-02-04 NOTE — Progress Notes (Signed)
CARE Daily Session Note ? ?Patient Details  ?Name: Christina Mcclain ?MRN: [id number] ?Date of Birth: 1956-05-05 ?Referring Provider:   ?Flowsheet Row Cancer Associated Rehabilitation & Exercise from 02/04/2022 in [facility] Cardiac and Pulmonary Rehab  ?Referring Provider Randa Evens MD  ? ?  ? ? ?Encounter Date: 02/04/2022 ? ?Check In: ? Session Check In - 02/04/22 1356   ? ?  ? Check-In  ? Supervising physician immediately available to respond to emergencies See telemetry face sheet for immediately available ER MD   ? Location [facility]-Cardiac & Pulmonary Rehab   ? Staff Present Birdie Sons, MPA, Nino Glow, MS, ASCM CEP, Exercise Physiologist;Jessica Luan Pulling, MA, RCEP, CCRP, CCET   ? Virtual Visit No   ? Medication changes reported     No   ? Fall or balance concerns reported    No   ? Warm-up and Cool-down Not performed (comment)   6MWT and Gym Orientation  ? Resistance Training Performed No   ? VAD Patient? No   ? PAD/SET Patient? No   ? ?  ?  ? ?  ? ? ? 6 Minute Walk   ? ? Clever Name 02/04/22 1402  ?  ?  ?  ? 6 Minute Walk  ? Phase Initial    ? Distance 740 feet    ? Walk Time 5.5 minutes  Break 2:54-3:11; 4:36-4:50    ? # of Rest Breaks 2    ? MPH 1.52    ? METS 1.57    ? RPE 13    ? Perceived Dyspnea  3    ? VO2 Peak 5.52    ? Symptoms Yes (comment)    ? Comments SOB, leg cramps    ? Resting HR 87 bpm    ? Resting BP 118/62    ? Resting Oxygen Saturation  94 %    ? Exercise Oxygen Saturation  during 6 min walk 90 %    ? Max Ex. HR 102 bpm    ? Max Ex. BP 158/64    ? 2 Minute Post BP 110/60    ? ?  ?  ? ?  ?  ? ? ? Exercise Prescription Changes - 02/04/22 1400   ? ?  ? Response to Exercise  ? Blood Pressure (Admit) 118/62   ? Blood Pressure (Exercise) 158/64   ? Blood Pressure (Exit) 110/60   ? Heart Rate (Admit) 87 bpm   ? Heart Rate (Exercise) 102 bpm   ? Heart Rate (Exit) 84 bpm   ? Oxygen Saturation (Admit) 94 %   ? Oxygen Saturation (Exercise) 90 %   ? Oxygen Saturation (Exit) 95 %   ? Rating of Perceived  Exertion (Exercise) 13   ? Perceived Dyspnea (Exercise) 3   ? Symptoms SOB, leg cramps   ? Comments walk test results   ? ?  ?  ? ?  ? ? ?Social History  ? ?Tobacco Use  ?Smoking Status Never  ?Smokeless Tobacco Never  ? ? ?Goals Met:  ?Proper associated with RPD/PD & O2 Sat ?Exercise tolerated well ?Personal goals reviewed ?[place] for purse lip breathing ?No report of concerns or symptoms today ? ?Goals Unmet:  ?Not Applicable ? ?Comments: First full day of  orientation. All starting workloads were established based on the results of the 6 minute walk test done at initial orientation visit.  The plan for exercise progression was also introduced and progression will be customized based on patient's performance and goals.  ? ? ?  Dr. Emily Filbert is Medical Director for [place].  ?Dr. Ottie Glazier is Medical Director for [facility] Pulmonary Rehabilitation. ?

## 2022-02-06 ENCOUNTER — Other Ambulatory Visit: Payer: Self-pay

## 2022-02-06 DIAGNOSIS — C50919 Malignant neoplasm of unspecified site of unspecified female breast: Secondary | ICD-10-CM | POA: Diagnosis present

## 2022-02-06 DIAGNOSIS — E119 Type 2 diabetes mellitus without complications: Secondary | ICD-10-CM | POA: Diagnosis not present

## 2022-02-06 LAB — GLUCOSE, CAPILLARY: Glucose-Capillary: 124 mg/dL — ABNORMAL HIGH (ref 70–99)

## 2022-02-06 NOTE — Progress Notes (Signed)
Daily Session Note ? ?Patient Details  ?Name: Christina Mcclain ?MRN: [id number] ?Date of Birth: [birth year] ?Referring Provider:   ?Flowsheet Row Cancer Associated Rehabilitation & Exercise from 02/04/2022 in [facility] Cardiac and Pulmonary Rehab  ?Referring Provider Randa Evens MD  ? ?  ? ? ?Encounter Date: 02/06/2022 ? ?Check In: ? Session Check In - 02/06/22 1347   ? ?  ? Check-In  ? Supervising physician immediately available to respond to emergencies See telemetry face sheet for immediately available ER MD   ? Location [facility]-Cardiac & Pulmonary Rehab   ? Staff Present Birdie Sons, MPA, Nino Glow, MS, ASCM CEP, Exercise Physiologist;Melissa Tilford Pillar, RDN, LDN   ? Virtual Visit No   ? Medication changes reported     No   ? Fall or balance concerns reported    No   ? Warm-up and Cool-down Performed on first and last piece of equipment   ? Resistance Training Performed Yes   ? VAD Patient? No   ? PAD/SET Patient? No   ? ?  ?  ? ?  ? ? ? ? ? ?Social History  ? ?Tobacco Use  ?Smoking Status Never  ?Smokeless Tobacco Never  ? ? ?Goals Met:  ?Proper associated with RPD/PD & O2 Sat ?Independence with exercise equipment ?Exercise tolerated well ?No report of concerns or symptoms today ?Strength training completed today ? ?Goals Unmet:  ?Not Applicable ? ?Comments: First full day of exercise!  Patient was oriented to gym and equipment including functions, settings, policies, and procedures.  Patient's individual exercise prescription and treatment plan were reviewed.  All starting workloads were established based on the results of the 6 minute walk test done at initial orientation visit.  The plan for exercise progression was also introduced and progression will be customized based on patient's performance and goals.  ? ? ?Dr. Emily Filbert is Medical Director for [place].  ?Dr. Ottie Glazier is Medical Director for [facility] Pulmonary Rehabilitation. ?

## 2022-02-13 ENCOUNTER — Other Ambulatory Visit: Payer: Self-pay

## 2022-02-13 DIAGNOSIS — C50919 Malignant neoplasm of unspecified site of unspecified female breast: Secondary | ICD-10-CM

## 2022-02-13 LAB — GLUCOSE, CAPILLARY
Glucose-Capillary: 94 mg/dL (ref 70–99)
Glucose-Capillary: 102 mg/dL — ABNORMAL HIGH (ref 70–99)

## 2022-02-13 NOTE — Progress Notes (Signed)
Daily Session Note ? ?Patient Details  ?Name: Christina Mcclain ?MRN: [id number] ?Date of Birth: [birth year] ?Referring Provider:   ?Flowsheet Row Cancer Associated Rehabilitation & Exercise from 02/04/2022 in [facility] Cardiac and Pulmonary Rehab  ?Referring Provider Randa Evens MD  ? ?  ? ? ?Encounter Date: 02/13/2022 ? ?Check In: ? Session Check In - 02/13/22 1253   ? ?  ? Check-In  ? Supervising physician immediately available to respond to emergencies See telemetry face sheet for immediately available ER MD   ? Location [facility]-Cardiac & Pulmonary Rehab   ? Staff Present Coralie Keens, MS, ASCM CEP, Exercise Physiologist;Amanda Oletta Darter, [place], ACSM CEP, Exercise Physiologist   ? Virtual Visit No   ? Medication changes reported     No   ? Fall or balance concerns reported    No   ? Warm-up and Cool-down Performed on first and last piece of equipment   ? Resistance Training Performed No   yoga  ? VAD Patient? No   ? PAD/SET Patient? No   ? ?  ?  ? ?  ? ? ? ? ? ?Social History  ? ?Tobacco Use  ?Smoking Status Never  ?Smokeless Tobacco Never  ? ? ?Goals Met:  ?Proper associated with RPD/PD & O2 Sat ?Exercise tolerated well ?No report of concerns or symptoms today ? ?Goals Unmet:  ?Not Applicable ? ?Comments: Pt able to follow exercise prescription today without complaint.  Will continue to monitor for progression.  ? ?Yoga completed today by Exercise Physiologist, Nada Maclachlan ? ? ?Dr. Emily Filbert is Medical Director for [place].  ?Dr. Ottie Glazier is Medical Director for [facility] Pulmonary Rehabilitation. ?

## 2022-02-18 ENCOUNTER — Other Ambulatory Visit: Payer: Self-pay

## 2022-02-18 DIAGNOSIS — C50919 Malignant neoplasm of unspecified site of unspecified female breast: Secondary | ICD-10-CM

## 2022-02-18 NOTE — Progress Notes (Signed)
Daily Session Note ? ?Patient Details  ?Name: Christina Mcclain ?MRN: [id number] ?Date of Birth: 1956/03/17 ?Referring Provider:   ?Flowsheet Row Cancer Associated Rehabilitation & Exercise from 02/04/2022 in [facility] Cardiac and Pulmonary Rehab  ?Referring Provider Randa Evens MD  ? ?  ? ? ?Encounter Date: 02/18/2022 ? ?Check In: ? Session Check In - 02/18/22 1228   ? ?  ? Check-In  ? Supervising physician immediately available to respond to emergencies See telemetry face sheet for immediately available ER MD   ? Location [facility]-Cardiac & Pulmonary Rehab   ? Staff Present Birdie Sons, MPA, Nino Glow, MS, ASCM CEP, Exercise Physiologist;Jessica Luan Pulling, MA, RCEP, CCRP, CCET   ? Virtual Visit No   ? Medication changes reported     No   ? Fall or balance concerns reported    No   ? Warm-up and Cool-down Performed on first and last piece of equipment   ? Resistance Training Performed Yes   ? VAD Patient? No   ? PAD/SET Patient? No   ?  ? Pain Assessment  ? Currently in Pain? No/denies   ? ?  ?  ? ?  ? ? ? ? ? ?Social History  ? ?Tobacco Use  ?Smoking Status Never  ?Smokeless Tobacco Never  ? ? ?Goals Met:  ?Independence with exercise equipment ?Exercise tolerated well ?No report of concerns or symptoms today ?Strength training completed today ? ?Goals Unmet:  ?Not Applicable ? ?Comments: Pt able to follow exercise prescription today without complaint.  Will continue to monitor for progression. ? ? ? ?Dr. Emily Filbert is Medical Director for [place].  ?Dr. Ottie Glazier is Medical Director for [facility] Pulmonary Rehabilitation. ?

## 2022-02-20 DIAGNOSIS — C50919 Malignant neoplasm of unspecified site of unspecified female breast: Secondary | ICD-10-CM

## 2022-02-20 NOTE — Progress Notes (Signed)
Daily Session Note ? ?Patient Details  ?Name: Christina Mcclain ?MRN: [id number] ?Date of Birth: [birth year] ?Referring Provider:   ?Flowsheet Row Cancer Associated Rehabilitation & Exercise from 02/04/2022 in [facility] Cardiac and Pulmonary Rehab  ?Referring Provider Randa Evens MD  ? ?  ? ? ?Encounter Date: 02/20/2022 ? ?Check In: ? Session Check In - 02/20/22 1232   ? ?  ? Check-In  ? Supervising physician immediately available to respond to emergencies See telemetry face sheet for immediately available ER MD   ? Location [facility]-Cardiac & Pulmonary Rehab   ? Staff Present Birdie Sons, MPA, Nino Glow, MS, ASCM CEP, Exercise Physiologist;Amanda Oletta Darter, BA, ACSM CEP, Exercise Physiologist   ? Virtual Visit No   ? Medication changes reported     No   ? Fall or balance concerns reported    No   ? Warm-up and Cool-down Performed on first and last piece of equipment   ? Resistance Training Performed Yes   ? VAD Patient? No   ? PAD/SET Patient? No   ?  ? Pain Assessment  ? Currently in Pain? No/denies   ? ?  ?  ? ?  ? ? ? ? ? ?Social History  ? ?Tobacco Use  ?Smoking Status Never  ?Smokeless Tobacco Never  ? ? ?Goals Met:  ?Independence with exercise equipment ?Exercise tolerated well ?No report of concerns or symptoms today ?Strength training completed today ? ?Goals Unmet:  ?Not Applicable ? ?Comments: Pt able to follow exercise prescription today without complaint.  Will continue to monitor for progression. ? ? ? ?Dr. Emily Filbert is Medical Director for [place].  ?Dr. Ottie Glazier is Medical Director for [facility] Pulmonary Rehabilitation. ?

## 2022-02-24 IMAGING — NM NM CARDIA MUGA REST
6 series · 38 of 38 positions shown · non-contrast
Comparison: MUGA scan 11/01/2019

CLINICAL DATA: Breast cancer. Evaluate cardiac function in relation
to chemotherapy.

EXAM:
NUCLEAR MEDICINE CARDIAC BLOOD POOL IMAGING (MUGA)
TECHNIQUE: Cardiac multi-gated acquisition was performed at rest following
intravenous injection of 8c-99m labeled red blood cells.
RADIOPHARMACEUTICALS:  20.1 mCi 8c-99m pertechnetate in-vitro
labeled red blood cells IV

[Series 1000: 45 lao-gated (original with roi) · 3.30mm/px · 6 of 24 frames shown]
[frame 3/24]
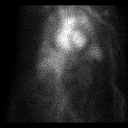
[frame 7/24]
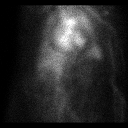
[frame 11/24]
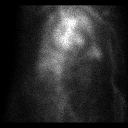
[frame 15/24]
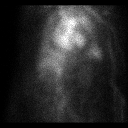
[frame 19/24]
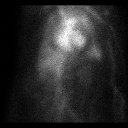
[frame 23/24]
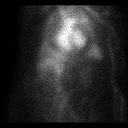

[Series 1000: anterior-gated · 3.30mm/px · 6 of 24 frames shown]
[frame 3/24]
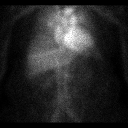
[frame 7/24]
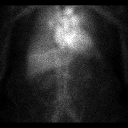
[frame 11/24]
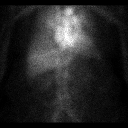
[frame 15/24]
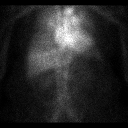
[frame 19/24]
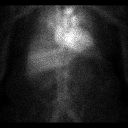
[frame 23/24]
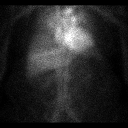

[Series 1000: 45 lao-gated (functional) · 3.30mm/px · 8 of 8 slices shown]
[im 1/8]
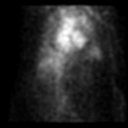
[im 2/8]
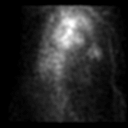
[im 3/8]
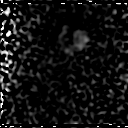
[im 4/8  full-range]
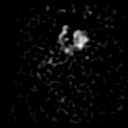
[im 5/8  full-range]
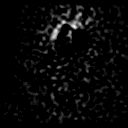
[im 6/8  full-range]
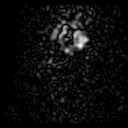
[im 7/8]
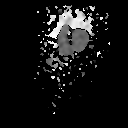
[im 8/8]
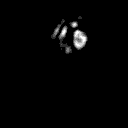

[Series 1000: 45 lao-gated · 3.30mm/px · 6 of 24 frames shown]
[frame 3/24]
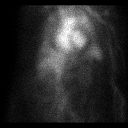
[frame 7/24]
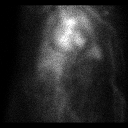
[frame 11/24]
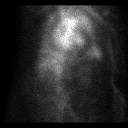
[frame 15/24]
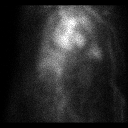
[frame 19/24]
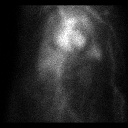
[frame 23/24]
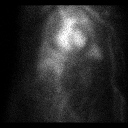

[Series 1000: 45 lao-gated (results) · 3.30mm/px · 6 of 24 frames shown]
[frame 3/24]
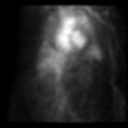
[frame 7/24]
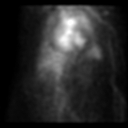
[frame 11/24]
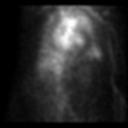
[frame 15/24]
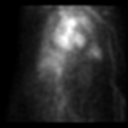
[frame 19/24]
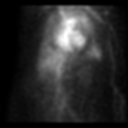
[frame 23/24]
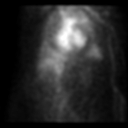

[Series 1000: 70 degree-gated · 3.30mm/px · 6 of 24 frames shown]
[frame 3/24]
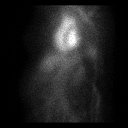
[frame 7/24]
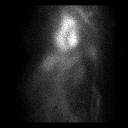
[frame 11/24]
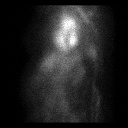
[frame 15/24]
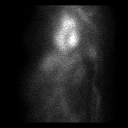
[frame 19/24]
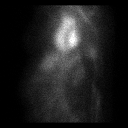
[frame 23/24]
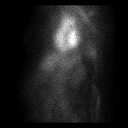

[38 of 38 positions shown; findings below may reference images not displayed]

FINDINGS: No  focal wall motion abnormality of the left ventricle.

Calculated left ventricular ejection fraction equals 65% (comparable
to 60 % on 11/01/2019)
IMPRESSION: Calculated left ventricular ejection fraction equals 65% (comparable
to 60 % on 11/01/2019)

## 2022-02-27 ENCOUNTER — Other Ambulatory Visit: Payer: Medicare Other

## 2022-03-04 ENCOUNTER — Encounter: Payer: Medicare Other | Attending: Internal Medicine

## 2022-03-04 DIAGNOSIS — C50919 Malignant neoplasm of unspecified site of unspecified female breast: Secondary | ICD-10-CM | POA: Insufficient documentation

## 2022-03-04 NOTE — Progress Notes (Signed)
Daily Session Note ? ?Patient Details  ?Name: Christina Mcclain ?MRN: [id number] ?Date of Birth: 01/10/1956 ?Referring Provider:   ?Flowsheet Row Cancer Associated Rehabilitation & Exercise from 02/04/2022 in [facility] and Pul[facility] Re[facility]  ?Referring Provider Rao, Archana MD  ? ?  ? ? ?Encounter Date: 03/04/2022 ? ?Check In: ? Session Check In - 03/04/22 1258   ? ?  ? Check-In  ? Supervising physician immediately available to respond to emergencies See telemetry face sheet for immediately available ER MD   ? Location [facility]   ? Staff Present Kelly Bollinger, MPA, RN;Kara Langdon, MS, ASCM CEP, Exercise Physiologist;Melissa Caiola, RDN, LDN   ? Virtual Visit No   ? Medication changes reported     No   ? Fall or balance concerns reported    No   ? Warm-up and Cool-down Performed on first and last piece of equipment   ? Resistance Training Performed Yes   ? VAD Patient? No   ? PAD/SET Patient? No   ?  ? Pain Assessment  ? Currently in Pain? No/denies   ? ?  ?  ? ?  ? ? ? ? ? ?Social History  ? ?Tobacco Use  ?Smoking Status Never  ?Smokeless Tobacco Never  ? ? ?Goals Met:  ?Independence with exercise equipment ?Exercise tolerated well ?No report of concerns or symptoms today ?Strength training completed today ? ?Goals Unmet:  ?Not Applicable ? ?Comments: Pt able to follow exercise prescription today without complaint.  Will continue to monitor for progression. ? ? ? ?Dr. Mark Miller is Medical Director for [facility].  ?Dr. Fuad Aleskerov is Medical Director for [facility] Pulmonary Rehabilitation. ?

## 2022-03-06 DIAGNOSIS — C50919 Malignant neoplasm of unspecified site of unspecified female breast: Secondary | ICD-10-CM

## 2022-03-06 NOTE — Progress Notes (Signed)
Daily Session Note ? ?Patient Details  ?Name: Christina Mcclain ?MRN: [id number] ?Date of Birth: [birth year] ?Referring Provider:   ?Flowsheet Row Cancer Associated Rehabilitation & Exercise from 02/04/2022 in [facility] Re[facility]  ?Referring Provider Randa Evens MD  ? ?  ? ? ?Encounter Date: 03/06/2022 ? ?Check In: ? Session Check In - 03/06/22 1346   ? ?  ? Check-In  ? Supervising physician immediately available to respond to emergencies See telemetry face sheet for immediately available ER MD   ? Location [facility]   ? Staff Present Coralie Keens, MS, ASCM CEP, Exercise Physiologist;Amanda Oletta Darter, [place], ACSM CEP, Exercise Physiologist   ? Virtual Visit No   ? Medication changes reported     No   ? Fall or balance concerns reported    No   ? Warm-up and Cool-down Performed on first and last piece of equipment   ? Resistance Training Performed Yes   ? VAD Patient? No   ? PAD/SET Patient? No   ? ?  ?  ? ?  ? ? ? ? ? ?Social History  ? ?Tobacco Use  ?Smoking Status Never  ?Smokeless Tobacco Never  ? ? ?Goals Met:  ?Proper associated with RPD/PD & O2 Sat ?Independence with exercise equipment ?Exercise tolerated well ?No report of concerns or symptoms today ?Strength training completed today ? ?Goals Unmet:  ?Not Applicable ? ?Comments: Pt able to follow exercise prescription today without complaint.  Will continue to monitor for progression.  ? ? ?Dr. Emily Filbert is Medical Director for [place].  ?Dr. Ottie Glazier is Medical Director for [facility] Pulmonary Rehabilitation. ?

## 2022-03-11 DIAGNOSIS — C50919 Malignant neoplasm of unspecified site of unspecified female breast: Secondary | ICD-10-CM

## 2022-03-11 NOTE — Progress Notes (Signed)
Daily Session Note ? ?Patient Details  ?Name: Christina Mcclain ?MRN: [id number] ?Date of Birth: 06/23/1956 ?Referring Provider:   ?Flowsheet Row Cancer Associated Rehabilitation & Exercise from 02/04/2022 in [facility] Cardiac and Pulmonary Rehab  ?Referring Provider Randa Evens MD  ? ?  ? ? ?Encounter Date: 03/11/2022 ? ?Check In: ? Session Check In - 03/11/22 1237   ? ?  ? Check-In  ? Supervising physician immediately available to respond to emergencies See telemetry face sheet for immediately available ER MD   ? Location [facility]   ? Staff Present Birdie Sons, MPA, RN;Jessica [place], MA, RCEP, CCRP, Marylynn Pearson, MS, ASCM CEP, Exercise Physiologist   ? Virtual Visit No   ? Medication changes reported     No   ? Fall or balance concerns reported    No   ? Warm-up and Cool-down Performed on first and last piece of equipment   ? Resistance Training Performed Yes   ? VAD Patient? No   ? PAD/SET Patient? No   ?  ? Pain Assessment  ? Currently in Pain? No/denies   ? ?  ?  ? ?  ? ? ? ? ? ?Social History  ? ?Tobacco Use  ?Smoking Status Never  ?Smokeless Tobacco Never  ? ? ?Goals Met:  ?Independence with exercise equipment ?Exercise tolerated well ?No report of concerns or symptoms today ?Strength training completed today ? ?Goals Unmet:  ?Not Applicable ? ?Comments: Pt able to follow exercise prescription today without complaint.  Will continue to monitor for progression. ? ? ? ?Dr. Emily Filbert is Medical Director for [place].  ?Dr. Ottie Glazier is Medical Director for [facility] Pulmonary Rehabilitation. ?

## 2022-03-13 DIAGNOSIS — C50919 Malignant neoplasm of unspecified site of unspecified female breast: Secondary | ICD-10-CM

## 2022-03-13 NOTE — Progress Notes (Signed)
Daily Session Note ? ?Patient Details  ?Name: Christina Mcclain ?MRN: [id number] ?Date of Birth: [birth year] ?Referring Provider:   ?Flowsheet Row Cancer Associated Rehabilitation & Exercise from 02/04/2022 in [facility] Cardiac and Pulmonary Rehab  ?Referring Provider Randa Evens MD  ? ?  ? ? ?Encounter Date: 03/13/2022 ? ?Check In: ? Session Check In - 03/13/22 1217   ? ?  ? Check-In  ? Supervising physician immediately available to respond to emergencies See telemetry face sheet for immediately available ER MD   ? Location [facility]   ? Staff Present Birdie Sons, MPA, Nino Glow, MS, ASCM CEP, Exercise Physiologist;Amanda Oletta Darter, BA, ACSM CEP, Exercise Physiologist   ? Virtual Visit No   ? Medication changes reported     No   ? Fall or balance concerns reported    No   ? Warm-up and Cool-down Performed as group-led instruction   ? Resistance Training Performed No   yoga  ? VAD Patient? No   ? PAD/SET Patient? No   ?  ? Pain Assessment  ? Currently in Pain? No/denies   ? ?  ?  ? ?  ? ? ? ? ? ?Social History  ? ?Tobacco Use  ?Smoking Status Never  ?Smokeless Tobacco Never  ? ? ?Goals Met:  ?Independence with exercise equipment ?Exercise tolerated well ?No report of concerns or symptoms today ?Strength training completed today ? ?Goals Unmet:  ?Not Applicable ? ?Comments: Pt able to follow exercise prescription today without complaint.  Will continue to monitor for progression. ? ?Yoga completed today by Nada Maclachlan, Exercise Physiologist. ? ?Dr. Emily Filbert is Medical Director for [place].  ?Dr. Ottie Glazier is Medical Director for [facility] Pulmonary Rehabilitation. ?

## 2022-03-17 ENCOUNTER — Encounter: Payer: Self-pay | Admitting: Oncology

## 2022-03-18 DIAGNOSIS — C50919 Malignant neoplasm of unspecified site of unspecified female breast: Secondary | ICD-10-CM

## 2022-03-18 NOTE — Progress Notes (Signed)
Daily Session Note ? ?Patient Details  ?Name: Christina Mcclain ?MRN: [id number] ?Date of Birth: 1956/07/28 ?Referring Provider:   ?Flowsheet Row Cancer Associated Rehabilitation & Exercise from 02/04/2022 in [facility] Cardiac and Pulmonary Rehab  ?Referring Provider Randa Evens MD  ? ?  ? ? ?Encounter Date: 03/18/2022 ? ?Check In: ? Session Check In - 03/18/22 1238   ? ?  ? Check-In  ? Supervising physician immediately available to respond to emergencies See telemetry face sheet for immediately available ER MD   ? Location [facility]   ? Staff Present Birdie Sons, MPA, RN;Jessica [place], MA, RCEP, CCRP, Marylynn Pearson, MS, ASCM CEP, Exercise Physiologist   ? Virtual Visit No   ? Medication changes reported     No   ? Fall or balance concerns reported    No   ? Warm-up and Cool-down Performed on first and last piece of equipment   ? Resistance Training Performed Yes   ? VAD Patient? No   ? PAD/SET Patient? No   ?  ? Pain Assessment  ? Currently in Pain? No/denies   ? ?  ?  ? ?  ? ? ? ? ? ?Social History  ? ?Tobacco Use  ?Smoking Status Never  ?Smokeless Tobacco Never  ? ? ?Goals Met:  ?Independence with exercise equipment ?Exercise tolerated well ?No report of concerns or symptoms today ?Strength training completed today ? ?Goals Unmet:  ?Not Applicable ? ?Comments: Pt able to follow exercise prescription today without complaint.  Will continue to monitor for progression. ? ? ? ?Dr. Emily Filbert is Medical Director for [place].  ?Dr. Ottie Glazier is Medical Director for [facility] Pulmonary Rehabilitation. ?

## 2022-03-19 ENCOUNTER — Ambulatory Visit
Admission: RE | Admit: 2022-03-19 | Discharge: 2022-03-19 | Disposition: A | Discharge status: Home / Self Care | Payer: Medicare Other | Source: Ambulatory Visit | Attending: Oncology | Admitting: Oncology

## 2022-03-19 DIAGNOSIS — Z79811 Long term (current) use of aromatase inhibitors: Secondary | ICD-10-CM | POA: Insufficient documentation

## 2022-03-19 DIAGNOSIS — Z08 Encounter for follow-up examination after completed treatment for malignant neoplasm: Secondary | ICD-10-CM | POA: Insufficient documentation

## 2022-03-19 DIAGNOSIS — Z853 Personal history of malignant neoplasm of breast: Secondary | ICD-10-CM | POA: Insufficient documentation

## 2022-03-24 ENCOUNTER — Other Ambulatory Visit: Payer: Self-pay | Admitting: *Deleted

## 2022-03-24 DIAGNOSIS — R140 Abdominal distension (gaseous): Secondary | ICD-10-CM

## 2022-03-24 DIAGNOSIS — N939 Abnormal uterine and vaginal bleeding, unspecified: Secondary | ICD-10-CM

## 2022-03-24 DIAGNOSIS — C50919 Malignant neoplasm of unspecified site of unspecified female breast: Secondary | ICD-10-CM

## 2022-03-24 NOTE — Progress Notes (Signed)
Orders for a scan due to patient having vaginal bleeding ?

## 2022-03-24 NOTE — Telephone Encounter (Signed)
Lets get ct abdomen pelvis without contrast

## 2022-03-25 ENCOUNTER — Encounter: Payer: Medicare Other | Attending: Internal Medicine

## 2022-03-25 DIAGNOSIS — C50919 Malignant neoplasm of unspecified site of unspecified female breast: Secondary | ICD-10-CM | POA: Insufficient documentation

## 2022-03-25 NOTE — Progress Notes (Signed)
Daily Session Note ? ?Patient Details  ?Name: Christina Mcclain ?MRN: [id number] ?Date of Birth: 11/21/1956 ?Referring Provider:   ?Flowsheet Row Cancer Associated Rehabilitation & Exercise from 02/04/2022 in [facility] Card[facility] and Pulmonary Rehab  ?Referring Provider Rao, Archana MD  ? ?  ? ? ?Encounter Date: 03/25/2022 ? ?Check In: ? Session Check In - 03/25/22 1222   ? ?  ? Check-In  ? Supervising physician immediately available to respond to emergencies See telemetry face sheet for immediately available ER MD   ? Location [facility]   ? Staff Present Kelly Bollinger, MPA, RN;Kara Langdon, MS, ASCM CEP, Exercise Physiologist;Jessica [place], MA, RCEP, CCRP, CCET   ? Virtual Visit No   ? Medication changes reported     No   ? Fall or balance concerns reported    No   ? Warm-up and Cool-down Performed on first and last piece of equipment   ? Resistance Training Performed Yes   ? VAD Patient? No   ? PAD/SET Patient? No   ?  ? Pain Assessment  ? Currently in Pain? No/denies   ? ?  ?  ? ?  ? ? ? ? ? ?Social History  ? ?Tobacco Use  ?Smoking Status Never  ?Smokeless Tobacco Never  ? ? ?Goals Met:  ?Independence with exercise equipment ?Exercise tolerated well ?No report of concerns or symptoms today ?Strength training completed today ? ?Goals Unmet:  ?Not Applicable ? ?Comments: Pt able to follow exercise prescription today without complaint.  Will continue to monitor for progression. ? ? ? ?Dr. Mark Miller is Medical Director for [facility].  ?Dr. Fuad Aleskerov is Medical Director for [facility] Pulmonary Rehabilitation. ?

## 2022-03-26 ENCOUNTER — Encounter: Payer: Self-pay | Admitting: Oncology

## 2022-03-27 ENCOUNTER — Encounter: Payer: Self-pay | Admitting: Oncology

## 2022-03-27 DIAGNOSIS — C50919 Malignant neoplasm of unspecified site of unspecified female breast: Secondary | ICD-10-CM

## 2022-03-27 NOTE — Progress Notes (Signed)
Daily Session Note ? ?Patient Details  ?Name: Christina Mcclain ?MRN: [id number] ?Date of Birth: 1955-11-27 ?Referring Provider:   ?Flowsheet Row Cancer Associated Rehabilitation & Exercise from 02/04/2022 in [facility] Cardiac and Pulmonary Rehab  ?Referring Provider Randa Evens MD  ? ?  ? ? ?Encounter Date: 03/27/2022 ? ?Check In: ? Session Check In - 03/27/22 1240   ? ?  ? Check-In  ? Supervising physician immediately available to respond to emergencies See telemetry face sheet for immediately available ER MD   ? Location [facility]   ? Staff Present Birdie Sons, MPA, RN;Melissa Caiola, RDN, LDN;Jessica [place], MA, RCEP, CCRP, CCET   ? Virtual Visit No   ? Medication changes reported     No   ? Fall or balance concerns reported    No   ? Warm-up and Cool-down Performed on first and last piece of equipment   ? Resistance Training Performed Yes   bands  ? VAD Patient? No   ? PAD/SET Patient? No   ?  ? Pain Assessment  ? Currently in Pain? No/denies   ? ?  ?  ? ?  ? ? ? ? ? ?Social History  ? ?Tobacco Use  ?Smoking Status Never  ?Smokeless Tobacco Never  ? ? ?Goals Met:  ?Independence with exercise equipment ?Exercise tolerated well ?No report of concerns or symptoms today ?Strength training completed today ? ?Goals Unmet:  ?Not Applicable ? ?Comments: Pt able to follow exercise prescription today without complaint.  Will continue to monitor for progression. ? ? ? ?Dr. Emily Filbert is Medical Director for [place].  ?Dr. Ottie Glazier is Medical Director for [place] Pulmonary Rehabilitation. ?

## 2022-03-31 ENCOUNTER — Telehealth: Payer: Self-pay | Admitting: *Deleted

## 2022-03-31 NOTE — Telephone Encounter (Signed)
Pt. Called about why pt have to get ct scan when she had neg. U/s.  called ct and spoke to [facility] and i told him that pt called and said she wanted to know why she is getting ct scan after she had a u/s that was neg. and i told pt it was because she continued to have bleeding and cramping. pt said it only lasted 2-3 days and has not happened since the phone call day.Dr/ Janese Banks was ok to cancel since pt wanted to not have it and she is no longer having symptoms ?

## 2022-03-31 NOTE — Telephone Encounter (Signed)
Patient called requesting to speak with Judeen Hammans about a procedure she is having done tomorrow ?

## 2022-04-01 ENCOUNTER — Encounter: Payer: Medicare Other | Admitting: *Deleted

## 2022-04-01 ENCOUNTER — Ambulatory Visit: Payer: Medicare Other

## 2022-04-01 DIAGNOSIS — C50919 Malignant neoplasm of unspecified site of unspecified female breast: Secondary | ICD-10-CM

## 2022-04-01 NOTE — Progress Notes (Signed)
Daily Session Note ? ?Patient Details  ?Name: Christina Mcclain ?MRN: [id number] ?Date of Birth: 1956-09-02 ?Referring Provider:   ?Flowsheet Row Cancer Associated Rehabilitation & Exercise from 02/04/2022 in [facility] Cardiac and Pulmonary Rehab  ?Referring Provider Randa Evens MD  ? ?  ? ? ?Encounter Date: 04/01/2022 ? ?Check In: ? Session Check In - 04/01/22 1238   ? ?  ? Check-In  ? Supervising physician immediately available to respond to emergencies See telemetry face sheet for immediately available ER MD   ? Location [facility] Re[facility]   ? Staff Present Alberteen Sam, MA, RCEP, CCRP, Marylynn Pearson, MS, ASCM CEP, Exercise Physiologist;Susanne Bice, RN, BSN, CCRP   ? Virtual Visit No   ? Medication changes reported     No   ? Fall or balance concerns reported    No   ? Warm-up and Cool-down Performed on first and last piece of equipment   ? Resistance Training Performed Yes   ? VAD Patient? No   ? PAD/SET Patient? No   ?  ? Pain Assessment  ? Currently in Pain? No/denies   ? ?  ?  ? ?  ? ? ? ? ? ?Social History  ? ?Tobacco Use  ?Smoking Status Never  ?Smokeless Tobacco Never  ? ? ?Goals Met:  ?Proper associated with RPD/PD & O2 Sat ?Independence with exercise equipment ?Using PLB without cueing & demonstrates good technique ?Exercise tolerated well ?No report of concerns or symptoms today ?Strength training completed today ? ?Goals Unmet:  ?Not Applicable ? ?Comments: Pt able to follow exercise prescription today without complaint.  Will continue to monitor for progression. ? ? ? ?Dr. Emily Filbert is Medical Director for [place].  ?Dr. Ottie Glazier is Medical Director for [facility] Pulmonary Rehabilitation. ?

## 2022-04-03 ENCOUNTER — Encounter: Payer: Medicare Other | Admitting: *Deleted

## 2022-04-03 DIAGNOSIS — C50919 Malignant neoplasm of unspecified site of unspecified female breast: Secondary | ICD-10-CM

## 2022-04-03 NOTE — Progress Notes (Signed)
Daily Session Note ? ?Patient Details  ?Name: Christina Mcclain ?MRN: [id number] ?Date of Birth: 21-Feb-1956 ?Referring Provider:   ?Flowsheet Row Cancer Associated Rehabilitation & Exercise from 02/04/2022 in [facility] Cardiac and Pulmonary Rehab  ?Referring Provider Randa Evens MD  ? ?  ? ? ?Encounter Date: 04/03/2022 ? ?Check In: ? Session Check In - 04/03/22 1237   ? ?  ? Check-In  ? Supervising physician immediately available to respond to emergencies See telemetry face sheet for immediately available ER MD   ? Location [facility]   ? Staff Present Alber[place] Sam, MA, RCEP, CCRP, Marylynn Pearson, MS, ASCM CEP, Exercise Physiologist   ? Virtual Visit No   ? Medication changes reported     No   ? Warm-up and Cool-down Performed on first and last piece of equipment   ? Resistance Training Performed Yes   ? VAD Patient? No   ? PAD/SET Patient? No   ?  ? Pain Assessment  ? Currently in Pain? No/denies   ? ?  ?  ? ?  ? ? ? ? ? ?Social History  ? ?Tobacco Use  ?Smoking Status Never  ?Smokeless Tobacco Never  ? ? ?Goals Met:  ?Proper associated with RPD/PD & O2 Sat ?Independence with exercise equipment ?Exercise tolerated well ?No report of concerns or symptoms today ?Strength training completed today ? ?Goals Unmet:  ?Not Applicable ? ?Comments: Pt able to follow exercise prescription today without complaint.  Will continue to monitor for progression. ? ? ? ?Dr. Emily Filbert is Medical Director for [place].  ?Dr. Ottie Glazier is Medical Director for [facility] Pulmonary Rehabilitation. ?

## 2022-04-04 ENCOUNTER — Inpatient Hospital Stay: Payer: Medicare Other | Admitting: Oncology

## 2022-04-08 DIAGNOSIS — C50919 Malignant neoplasm of unspecified site of unspecified female breast: Secondary | ICD-10-CM

## 2022-04-08 NOTE — Progress Notes (Signed)
Daily Session Note ? ?Patient Details  ?Name: Christina Mcclain ?MRN: [id number] ?Date of Birth: 06/06/1956 ?Referring Provider:   ?Flowsheet Row Cancer Associated Rehabilitation & Exercise from 02/04/2022 in [facility] Cardiac and Pulmonary Rehab  ?Referring Provider Randa Evens MD  ? ?  ? ? ?Encounter Date: 04/08/2022 ? ?Check In: ? Session Check In - 04/08/22 1224   ? ?  ? Check-In  ? Supervising physician immediately available to respond to emergencies See telemetry face sheet for immediately available ER MD   ? Location [facility]   ? Staff Present Birdie Sons, MPA, RN;Melissa [place], RDN, LDN;Jessica [place], MA, RCEP, CCRP, Marylynn Pearson, MS, ASCM CEP, Exercise Physiologist;Susanne Bice, RN, BSN, CCRP   ? Virtual Visit No   ? Medication changes reported     No   ? Fall or balance concerns reported    No   ? Warm-up and Cool-down Performed on first and last piece of equipment   ? Resistance Training Performed Yes   ? VAD Patient? No   ? PAD/SET Patient? No   ?  ? Pain Assessment  ? Currently in Pain? No/denies   ? ?  ?  ? ?  ? ? ? ? ? ?Social History  ? ?Tobacco Use  ?Smoking Status Never  ?Smokeless Tobacco Never  ? ? ?Goals Met:  ?Independence with exercise equipment ?Exercise tolerated well ?No report of concerns or symptoms today ?Strength training completed today ? ?Goals Unmet:  ?Not Applicable ? ?Comments: Pt able to follow exercise prescription today without complaint.  Will continue to monitor for progression. ? ? ? ?Dr. Emily Filbert is Medical Director for [place].  ?Dr. Ottie Glazier is Medical Director for [facility] Pulmonary Rehabilitation. ?

## 2022-04-15 DIAGNOSIS — C50919 Malignant neoplasm of unspecified site of unspecified female breast: Secondary | ICD-10-CM

## 2022-04-15 NOTE — Progress Notes (Signed)
Daily Session Note  Patient Details  Name: Christina Mcclain MRN: [id number] Date of Birth: 24-Feb-1956 Referring Provider:   April Manson Cancer Associated Rehabilitation & Exercise from 02/04/2022 in [facility] Cardiac and Pulmonary Rehab  Referring Provider Randa Evens MD       Encounter Date: 04/15/2022  Check In:  Session Check In - 04/15/22 1218       Check-In   Supervising physician immediately available to respond to emergencies See telemetry face sheet for immediately available ER MD    Location [facility]Card[facility] Pulmonary Rehab    Staff Present Birdie Sons, MPA, RN;Jessica [place], MA, RCEP, CCRP, Marylynn Pearson, MS, ASCM CEP, Exercise Physiologist    Virtual Visit No    Medication changes reported     No    Fall or balance concerns reported    No    Warm-up and Cool-down Performed on first and last piece of equipment    Resistance Training Performed Yes    VAD Patient? No    PAD/SET Patient? No      Pain Assessment   Currently in Pain? No/denies                Social History   Tobacco Use  Smoking Status Never  Smokeless Tobacco Never    Goals Met:  Independence with exercise equipment Exercise tolerated well No report of concerns or symptoms today Strength training completed today  Goals Unmet:  Not Applicable  Comments: Pt able to follow exercise prescription today without complaint.  Will continue to monitor for progression.    Dr. Emily Filbert is Medical Director for [place].  Dr. Ottie Glazier is Medical Director for [facility] Pulmonary Rehabilitation.

## 2022-04-17 DIAGNOSIS — C50919 Malignant neoplasm of unspecified site of unspecified female breast: Secondary | ICD-10-CM

## 2022-04-17 NOTE — Progress Notes (Signed)
Daily Session Note  Patient Details  Name: Christina Mcclain MRN: [id number] Date of Birth: [birth year] Referring Provider:   April Manson Cancer Associated Rehabilitation & Exercise from 02/04/2022 in [facility] Cardiac and Pulmonary Rehab  Referring Provider Randa Evens MD       Encounter Date: 04/17/2022  Check In:  Session Check In - 04/17/22 1306       Check-In   Supervising physician immediately available to respond to emergencies See telemetry face sheet for immediately available ER MD    Location [facility]-Cardiac & Pulmonary Rehab    Staff Present Carson Myrtle, BS, RRT, CPFT;Niema Carrara Eliezer Bottom, MS, ASCM CEP, Exercise Physiologist    Virtual Visit No    Medication changes reported     No    Fall or balance concerns reported    No    Warm-up and Cool-down Performed on first and last piece of equipment    Resistance Training Performed Yes    VAD Patient? No    PAD/SET Patient? No                Social History   Tobacco Use  Smoking Status Never  Smokeless Tobacco Never    Goals Met:  Proper associated with RPD/PD & O2 Sat Independence with exercise equipment Exercise tolerated well No report of concerns or symptoms today Strength training completed today  Goals Unmet:  Not Applicable  Comments: Pt able to follow exercise prescription today without complaint.  Will continue to monitor for progression.   Dr. Emily Filbert is Medical Director for [place].  Dr. Ottie Glazier is Medical Director for [facility] Pulmonary Rehabilitation.

## 2022-04-22 DIAGNOSIS — C50919 Malignant neoplasm of unspecified site of unspecified female breast: Secondary | ICD-10-CM

## 2022-04-22 NOTE — Progress Notes (Signed)
Daily Session Note  Patient Details  Name: Christina Mcclain MRN: [id number] Date of Birth: 07/25/1956 Referring Provider:   April Manson Cancer Associated Rehabilitation & Exercise from 02/04/2022 in [facility] Cardiac and Pulmonary Rehab  Referring Provider Randa Evens MD       Encounter Date: 04/22/2022  Check In:  Session Check In - 04/22/22 1231       Check-In   Supervising physician immediately available to respond to emergencies See telemetry face sheet for immediately available ER MD    Location [facility]-Cardiac & Pulmonary Rehab    Staff Present Birdie Sons, MPA, RN;Jessica Luan Pulling, MA, RCEP, CCRP, Marylynn Pearson, MS, ASCM CEP, Exercise Physiologist    Virtual Visit No    Medication changes reported     No    Fall or balance concerns reported    No    Warm-up and Cool-down Performed on first and last piece of equipment    Resistance Training Performed Yes    VAD Patient? No    PAD/SET Patient? No      Pain Assessment   Currently in Pain? No/denies                Social History   Tobacco Use  Smoking Status Never  Smokeless Tobacco Never    Goals Met:  Independence with exercise equipment Exercise tolerated well No report of concerns or symptoms today Strength training completed today  Goals Unmet:  Not Applicable  Comments: Pt able to follow exercise prescription today without complaint.  Will continue to monitor for progression.    Dr. Emily Filbert is Medical Director for [place].  Dr. Ottie Glazier is Medical Director for [facility] Pulmonary Rehabilitation.

## 2022-04-23 ENCOUNTER — Inpatient Hospital Stay: Payer: Medicare Other | Attending: Oncology | Admitting: Occupational Therapy

## 2022-04-23 DIAGNOSIS — R600 Localized edema: Secondary | ICD-10-CM

## 2022-04-23 NOTE — Therapy (Signed)
[facility] [place], [place], [place], [address] Phone: [phone]   Fax:  [phone]  Occupational Therapy Screen:  Patient Details  Name: Christina Mcclain MRN: [id number] Date of Birth: 10/07/1956 No data recorded  Encounter Date: 04/23/2022   OT End of Session - 04/23/22 1330     Visit Number 0             Past Medical History:  Diagnosis Date   Adverse effect of anesthesia    hard to awaken   Anemia    Arthritis    Asthma    Asthma without status asthmaticus    unspecified; Take advair each night   Breast cancer ([place]) 2021   right breast IMC   Bronchitis    Chronic kidney disease    stage III per dr Holley Raring   Constipation    Diabetes ([place]) 2010   Diabetes mellitus type 2, uncomplicated ([place])    Diabetes mellitus without complication ([place])    Encounter for blood transfusion    hysterectomy 9-10 years ago   Heart murmur    unspecified   Hyperlipidemia    Hypertension 1991   Hypothyroid    Multiple thyroid nodules    Neuromuscular disorder (HCC)    neuropathy   Osteoarthritis of right hip 02/04/2017   Palpitations    Pernicious anemia    Personal history of chemotherapy 2020-2021   Right breast ca   Personal history of radiation therapy 2021   right breast [facility]   Pneumonia    Sleep apnea    Use C-PAP; can't use mouth piece. Anxiety   Thyroid disease    nodules   Thyroid nodule    x2   Urinary urgency     Past Surgical History:  Procedure Laterality Date   ABDOMINAL HYSTERECTOMY  2011   partial   ABDOMINAL SURGERY     BREAST BIOPSY Right 10/02/2020   affirm bx, x marker, fat necrosis   BREAST BIOPSY Right 10/19/2019   two masses at 9:00 [place] bx, coil marker and venus marker, IMC for both   BREAST LUMPECTOMY Right 03/28/2020   bracketing of two IMC masses at 9:00.  Venus marker was pushed medially during wire loc. Remains in breast.  Clear surgical margins. Negative Lymph node    CARPAL TUNNEL RELEASE Left 2008   CARPAL TUNNEL RELEASE Right    CHOLECYSTECTOMY  1982   COLONOSCOPY WITH PROPOFOL N/A 10/05/2017   Procedure: COLONOSCOPY WITH PROPOFOL;  Surgeon: Manya Silvas, MD;  Location: [facility] ENDOSCOPY;  Service: Endoscopy;  Laterality: N/A;   COLONOSCOPY WITH PROPOFOL N/A 08/09/2021   Procedure: COLONOSCOPY WITH PROPOFOL;  Surgeon: Annamaria Helling, DO;  Location: [facility] ENDOSCOPY;  Service: Gastroenterology;  Laterality: N/A;   DIAGNOSTIC LAPAROSCOPY     ESOPHAGOGASTRODUODENOSCOPY (EGD) WITH PROPOFOL N/A 08/09/2021   Procedure: ESOPHAGOGASTRODUODENOSCOPY (EGD) WITH PROPOFOL;  Surgeon: Annamaria Helling, DO;  Location: [facility] ENDOSCOPY;  Service: Gastroenterology;  Laterality: N/A;   JOINT REPLACEMENT Right 02/09/2017   TOTAL HIP REPLACEMENT, DR. CARTER. [place]   KNEE ARTHROSCOPY  1990s   PARTIAL MASTECTOMY WITH NEEDLE LOCALIZATION AND AXILLARY SENTINEL LYMPH NODE BX Right 03/28/2020   Procedure: PARTIAL MASTECTOMY WITH NEEDLE LOCALIZATION AND AXILLARY SENTINEL LYMPH NODE BX;  Surgeon: Herbert Pun, MD;  Location: [facility] ORS;  Service: General;  Laterality: Right;   PORTACATH PLACEMENT Left 11/07/2019   Procedure: INSERTION PORT-A-CATH;  Surgeon: Herbert Pun, MD;  Location: [facility] ORS;  Service: General;  Laterality: Left;   TOTAL HIP ARTHROPLASTY Left 06/23/2017   Procedure: TOTAL HIP ARTHROPLASTY ANTERIOR APPROACH;  Surgeon: Hessie Knows, MD;  Location: [facility] ORS;  Service: Orthopedics;  Laterality: Left;    There were no vitals filed for this visit.   Subjective Assessment - 04/23/22 1329     Subjective  I wanted to see you to check for me if I am at risk for lymphedema or have lymphedema.  At times my left arm more so well up on my right and I have swelling in my legs 2.  I could not get into the cancer transition until this year although my cancer treatment was done in 2020.  But I really liked it I am sorry or sad that its done.     Currently in Pain? No/denies                 LYMPHEDEMA/ONCOLOGY QUESTIONNAIRE - 04/23/22 0001       Right Upper Extremity Lymphedema   15 cm Proximal to Olecranon Process 35.2 cm    10 cm Proximal to Olecranon Process 33.5 cm    Olecranon Process 27.5 cm    15 cm Proximal to Ulnar Styloid Process 26.2 cm    Just Proximal to Ulnar Styloid Process 16.5 cm      Left Upper Extremity Lymphedema   15 cm Proximal to Olecranon Process 34.4 cm    10 cm Proximal to Olecranon Process 32.4 cm    Olecranon Process 27.3 cm    15 cm Proximal to Ulnar Styloid Process 26.5 cm    Just Proximal to Ulnar Styloid Process 16.1 cm               Dr. Randa Evens, MD, MPH [place] [facility] [phone] 01/16/2022 8:37 AM Assessment and plan- Patient is a 66 y.o. 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  She is s/p 6 cycles of neoadjuvant TCHP chemotherapy with residual 5 mm tumor.  She had persistent cytopenias and thrombocytopenia with Kadcyla and was switched to single agent maintenance Herceptin which she completed in February 2022.  This is a routine follow-up visit for breast cancer  Clinically patient is doing well with no concerning signs and symptoms of recurrence based on today's exam.  She will continueLetrozole along with calcium and vitamin D for 10 years.  Her baseline bone density scan in August 2021 was normal and she will need another one in August of this year.  Recent mammogram from November 2022 was also unremarkable.   Ideally patient needs Zometa every 3 months for 6 months for up to 3 years as per ASCO guidelines to reduce the risk of skeletal metastases and ER positive breast cancer.  However patient reports having significant side effects from her first Zometa infusion and does not wish to continue any further.  We will therefore hold off on doing any  further Zometa at this time.  I will see her back in 6 months no labs.  We will also plan for port removal at this time   Visit Diagnosis 1. Encounter for follow-up surveillance of breast cancer   2. Use of letrozole ([place])         OT SCREEN 04/23/22: Patient was referred by cancer transition team.  Patient's cancer treatment was in 2020 but because of COVID did not get into cancer transition until this year.  Patient reports that  she is sometimes swelling in her left arm sometimes right arm-she does have swelling in both lower extremities.  Reviewed with patient signs and symptoms of lymphedema as well as prevention and risk.  Patient risk is very low because of only 2 lymph nodes removed as well as only lumpectomy.  She did had radiation.  Also reviewed with patient that she is only at risk for lymphedema on the right upper extremity or thoracic.  Therefore with her having swelling in her legs bilaterally as well as left upper extremity could maybe be related to other medical reason or side effect from medication. Circumference of bilateral upper extremities was taken patient within normal limits compared to the left upper extremity-she is right-hand dominant.  Did provide patient with a handout and reviewed with patient on signs and symptoms and prevention for lymphedema.  Patient verbalized understanding and can call me anytime if needed.  Patient risk for lymphedema is very low.                                  Visit Diagnosis: Edema of both upper extremities    Problem List Patient Active Problem List   Diagnosis Date Noted   Palpitations 11/09/2019   Pelvic pressure in female 11/09/2019   Invasive carcinoma of breast ([place]) 10/31/2019   Goals of care, counseling/discussion 10/31/2019   Nonintractable headache 08/03/2019   Tinnitus of both ears 08/03/2019   Bilateral hand numbness 09/14/2018   Bilateral hand pain 09/14/2018   Myalgia due to statin  08/26/2018   BMI 40.0-44.9, adult ([place]) 12/16/2017   Primary localized osteoarthrosis of left hip 06/23/2017   Controlled type 2 diabetes mellitus with complication, without long-term current use of insulin ([place]) 03/17/2017   Pernicious anemia 03/17/2017   Primary osteoarthritis involving multiple joints 03/17/2017   Status post right hip replacement 02/20/2017   At high risk for falls 02/11/2017   Mild persistent asthma without complication 84/66/5993   Impaired mobility and ADLs 02/11/2017   Myoclonus 07/23/2016   DISH (diffuse idiopathic skeletal hyperostosis) 07/23/2016   Obstructive sleep apnea syndrome 07/23/2016   Thyromegaly 07/23/2016   Left thyroid nodule 09/04/2014   Chest pain, unspecified 11/14/2012   High cholesterol 11/14/2012   Gastro-esophageal reflux disease without esophagitis 11/14/2012   Family history of early CAD 11/14/2012   Overactive bladder 04/12/2012   Hemorrhoids 01/08/2010   Essential (primary) hypertension 01/08/2010   Osteoarthritis of right hip 01/08/2010    Rosalyn Gess, OTR/L,CLT 04/23/2022, 1:32 PM  [place] [facility] [address] [place], [place], [address] Phone: [phone]   Fax:  [phone]  Name: Christina Mcclain MRN: [id number] Date of Birth: 1956/02/11

## 2022-04-24 ENCOUNTER — Encounter: Payer: Medicare Other | Attending: Internal Medicine

## 2022-04-24 DIAGNOSIS — C50919 Malignant neoplasm of unspecified site of unspecified female breast: Secondary | ICD-10-CM | POA: Insufficient documentation

## 2022-04-24 NOTE — Progress Notes (Signed)
Daily Session Note  Patient Details  Name: Shira Bobst MRN: [id number] Date of Birth: 1956/08/24 Referring Provider:   April Manson Cancer Associated Rehabilitation & Exercise from 02/04/2022 in [facility] Cardiac and Pulmonary Rehab  Referring Provider Randa Evens MD       Encounter Date: 04/24/2022  Check In:  Session Check In - 04/24/22 1223       Check-In   Supervising physician immediately available to respond to emergencies See telemetry face sheet for immediately available ER MD    Location [facility]-Cardiac & Pulmonary Rehab    Staff Present Birdie Sons, MPA, Nino Glow, MS, ASCM CEP, Exercise Physiologist;Jessica Luan Pulling, MA, RCEP, CCRP, CCET;Noah Tickle, BS, Exercise Physiologist    Virtual Visit No    Medication changes reported     No    Fall or balance concerns reported    No    Warm-up and Cool-down Performed on first and last piece of equipment    Resistance Training Performed Yes    VAD Patient? No    PAD/SET Patient? No      Pain Assessment   Currently in Pain? No/denies                Social History   Tobacco Use  Smoking Status Never  Smokeless Tobacco Never    Goals Met:  Independence with exercise equipment Exercise tolerated well No report of concerns or symptoms today Strength training completed today  Goals Unmet:  Not Applicable  Comments: Pt able to follow exercise prescription today without complaint.  Will continue to monitor for progression.    Dr. Emily Filbert is Medical Director for [place].  Dr. Ottie Glazier is Medical Director for [facility] Pulmonary Rehabilitation.

## 2022-04-25 ENCOUNTER — Inpatient Hospital Stay: Payer: Medicare Other

## 2022-05-02 ENCOUNTER — Inpatient Hospital Stay: Payer: Medicare Other | Attending: Oncology

## 2022-05-02 DIAGNOSIS — Z170 Estrogen receptor positive status [ER+]: Secondary | ICD-10-CM | POA: Diagnosis not present

## 2022-05-02 DIAGNOSIS — Z452 Encounter for adjustment and management of vascular access device: Secondary | ICD-10-CM | POA: Insufficient documentation

## 2022-05-02 DIAGNOSIS — C50911 Malignant neoplasm of unspecified site of right female breast: Secondary | ICD-10-CM | POA: Insufficient documentation

## 2022-05-02 DIAGNOSIS — Z95828 Presence of other vascular implants and grafts: Secondary | ICD-10-CM

## 2022-05-02 MED ORDER — SODIUM CHLORIDE 0.9% FLUSH
10.0000 mL | INTRAVENOUS | Status: DC | PRN
  Administered 2022-05-02: 10 mL via INTRAVENOUS
  Filled 2022-05-02: qty 10

## 2022-05-02 MED ORDER — HEPARIN SOD (PORK) LOCK FLUSH 100 UNIT/ML IV SOLN
500.0000 [IU] | Freq: Once | INTRAVENOUS | Status: AC
  Administered 2022-05-02: 500 [IU] via INTRAVENOUS
  Filled 2022-05-02: qty 5

## 2022-05-06 ENCOUNTER — Encounter: Payer: Medicare Other | Admitting: *Deleted

## 2022-05-06 DIAGNOSIS — C50919 Malignant neoplasm of unspecified site of unspecified female breast: Secondary | ICD-10-CM

## 2022-05-06 NOTE — Progress Notes (Signed)
Daily Session Note  Patient Details  Name: Christina Mcclain MRN: [id number] Date of Birth: [birth year] Referring Provider:   April Manson Cancer Associated Rehabilitation & Exercise from 02/04/2022 in [facility] Cardiac and Pulmonary Rehab  Referring Provider Randa Evens MD       Encounter Date: 05/06/2022  Check In:  Session Check In - 05/06/22 1155       Check-In   Supervising physician immediately available to respond to emergencies See telemetry face sheet for immediately available ER MD    Location [facility]-Cardiac & Pulmonary Rehab    Staff Present Heath Lark, RN, BSN, Jacklynn Bue, MS, ASCM CEP, Exercise Physiologist;Kaleigha Chamberlin Luan Pulling, MA, RCEP, CCRP, CCET    Virtual Visit No    Medication changes reported     No    Fall or balance concerns reported    No    Warm-up and Cool-down Performed on first and last piece of equipment    Resistance Training Performed Yes    VAD Patient? No    PAD/SET Patient? No      Pain Assessment   Currently in Pain? No/denies                Social History   Tobacco Use  Smoking Status Never  Smokeless Tobacco Never    Goals Met:  Proper associated with RPD/PD & O2 Sat Independence with exercise equipment Exercise tolerated well No report of concerns or symptoms today Strength training completed today  Goals Unmet:  Not Applicable  Comments: Pt able to follow exercise prescription today without complaint.  Will continue to monitor for progression.  Her weight is up today and she did note some increased swelling today.   Dr. Emily Filbert is Medical Director for [place].  Dr. Ottie Glazier is Medical Director for [facility] Pulmonary Rehabilitation.

## 2022-05-08 VITALS — Ht 62.0 in | Wt 222.8 lb

## 2022-05-08 DIAGNOSIS — C50919 Malignant neoplasm of unspecified site of unspecified female breast: Secondary | ICD-10-CM

## 2022-05-08 NOTE — Progress Notes (Signed)
Daily Session Note  Patient Details  Name: Christina Mcclain MRN: [id number] Date of Birth: 30-Nov-1955 Referring Provider:   April Manson Cancer Associated Rehabilitation & Exercise from 02/04/2022 in [facility] Cardiac and Pulmonary Rehab  Referring Provider Randa Evens MD       Encounter Date: 05/08/2022  Check In:  Session Check In - 05/08/22 1251       Check-In   Supervising physician immediately available to respond to emergencies See telemetry face sheet for immediately available ER MD    Location [facility]-Cardiac & Pulmonary Rehab    Staff Present Coralie Keens, MS, ASCM CEP, Exercise Physiologist;Melissa Caiola, RDN, LDN    Virtual Visit No    Medication changes reported     No    Fall or balance concerns reported    No    Warm-up and Cool-down Performed on first and last piece of equipment    Resistance Training Performed Yes    VAD Patient? No    PAD/SET Patient? No              6 Minute Walk     Row Name 02/04/22 1402 05/08/22 1331       6 Minute Walk   Phase Initial Discharge    Distance 740 feet 1000 feet    Distance % Change -- 35 %    Distance Feet Change -- 260 ft    Walk Time 5.5 minutes  Break 2:54-3:11; 4:36-4:50 6 minutes    # of Rest Breaks 2 0    MPH 1.52 1.89    METS 1.57 1.84    RPE 13 13    Perceived Dyspnea  3 1    VO2 Peak 5.52 6.45    Symptoms Yes (comment) Yes (comment)    Comments SOB, leg cramps SOB, leg knee pain 2/10    Resting HR 87 bpm 68 bpm    Resting BP 118/62 108/60    Resting Oxygen Saturation  94 % 95 %    Exercise Oxygen Saturation  during 6 min walk 90 % 89 %  Educated patient on O2 values- obtain pulse ox    Max Ex. HR 102 bpm 98 bpm    Max Ex. BP 158/64 142/60    2 Minute Post BP 110/60 100/60                Social History   Tobacco Use  Smoking Status Never  Smokeless Tobacco Never    Goals Met:  Independence with exercise equipment Exercise tolerated well Personal goals reviewed No report of concerns  or symptoms today Strength training completed today  Goals Unmet:  Not Applicable  Comments: Pt able to follow exercise prescription today without complaint.  Will continue to monitor for progression.    Dr. Emily Filbert is Medical Director for [place].  Dr. Ottie Glazier is Medical Director for [facility] Pulmonary Rehabilitation.

## 2022-05-13 VITALS — Ht 62.0 in | Wt 223.4 lb

## 2022-05-13 DIAGNOSIS — C50919 Malignant neoplasm of unspecified site of unspecified female breast: Secondary | ICD-10-CM

## 2022-05-13 NOTE — Progress Notes (Signed)
Daily Session Note  Patient Details  Name: Christina Mcclain MRN: [id number] Date of Birth: 12-05-1955 Referring Provider:   April Manson Cancer Associated Rehabilitation & Exercise from 02/04/2022 in [facility] Cardiac and Pulmonary Rehab  Referring Provider Randa Evens MD       Encounter Date: 05/13/2022  Check In:  Session Check In - 05/13/22 1226       Check-In   Supervising physician immediately available to respond to emergencies See telemetry face sheet for immediately available ER MD    Location [facility]-Cardiac & Pulmonary Rehab    Staff Present Birdie Sons, MPA, RN;Melissa [place], RDN, Tawanna Solo, MS, ASCM CEP, Exercise Physiologist    Virtual Visit No    Medication changes reported     No    Fall or balance concerns reported    No    Warm-up and Cool-down Performed on first and last piece of equipment    Resistance Training Performed Yes    VAD Patient? No    PAD/SET Patient? No      Pain Assessment   Currently in Pain? No/denies                Social History   Tobacco Use  Smoking Status Never  Smokeless Tobacco Never    Goals Met:  Independence with exercise equipment Exercise tolerated well No report of concerns or symptoms today Strength training completed today  Goals Unmet:  Not Applicable  Comments: Pt able to follow exercise prescription today without complaint.  Will continue to monitor for progression.    Dr. Emily Filbert is Medical Director for [place].  Dr. Ottie Glazier is Medical Director for [facility] Pulmonary Rehabilitation.

## 2022-05-13 NOTE — Patient Instructions (Signed)
CARE Discharge Patient Instructions  Patient Details  Name: Christina Mcclain MRN: [id number] Date of Birth: 1956-04-06 Referring Provider:  Perrin Maltese, MD   Number of Visits: 24  Reason for Discharge:  Patient reached a stable level of exercise. Patient independent in their exercise. Patient has met program and personal goals.  Smoking History:  Social History   Tobacco Use  Smoking Status Never  Smokeless Tobacco Never    Diagnosis:  Invasive carcinoma of breast ([place])  Initial Exercise Prescription:  Initial Exercise Prescription - 02/04/22 1400       Date of Initial Exercise RX and Referring Provider   Date 02/04/22    Referring Provider Randa Evens MD      Oxygen   Maintain Oxygen Saturation 88% or higher      NuStep   Level 1    SPM 80    Minutes 15    METs 1.5      REL-XR   Level 1    Speed 60    Minutes 15    METs 1.5      Biostep-RELP   Level 1    SPM 50    Minutes 15    METs 1.5      Track   Laps 14    Minutes 15    METs 1.76      Prescription Details   Frequency (times per week) 2    Duration Progress to 30 minutes of continuous aerobic without signs/symptoms of physical distress      Intensity   THRR 40-80% of Max Heartrate 113-139    Ratings of Perceived Exertion 11-13    Perceived Dyspnea 0-4      Progression   Progression Continue to progress workloads to maintain intensity without signs/symptoms of physical distress.      Resistance Training   Training Prescription Yes    Weight 3 lb    Reps 10-15             Discharge Exercise Prescription (Final Exercise Prescription Changes):  Exercise Prescription Changes - 05/13/22 1400       Response to Exercise   Blood Pressure (Admit) 128/62    Blood Pressure (Exercise) 146/68    Blood Pressure (Exit) 118/60    Heart Rate (Admit) 68 bpm    Heart Rate (Exercise) 100 bpm    Heart Rate (Exit) 65 bpm    Oxygen Saturation (Admit) 96 %    Oxygen Saturation (Exercise)  91 %    Oxygen Saturation (Exit) 95 %    Rating of Perceived Exertion (Exercise) 13    Comments close to graduation    Duration Continue with 30 min of aerobic exercise without signs/symptoms of physical distress.    Intensity THRR unchanged      Progression   Progression Continue to progress workloads to maintain intensity without signs/symptoms of physical distress.    Average METs 1.95      Resistance Training   Training Prescription Yes    Weight 3 lb    Reps 10-15      Interval Training   Interval Training No      NuStep   Level 3    Minutes 15    METs 2.4      Arm Ergometer   Level 1.5    Minutes 15    METs 1.1      Biostep-RELP   Level 1    Minutes 15    METs 1  Track   Laps 22   30 laps in 30 minutes   Minutes 15    METs 2.2      Oxygen   Maintain Oxygen Saturation 88% or higher             Functional Capacity:  6 Minute Walk     Row Name 02/04/22 1402 05/08/22 1331       6 Minute Walk   Phase Initial Discharge    Distance 740 feet 1000 feet    Distance % Change -- 35 %    Distance Feet Change -- 260 ft    Walk Time 5.5 minutes  Break 2:54-3:11; 4:36-4:50 6 minutes    # of Rest Breaks 2 0    MPH 1.52 1.89    METS 1.57 1.84    RPE 13 13    Perceived Dyspnea  3 1    VO2 Peak 5.52 6.45    Symptoms Yes (comment) Yes (comment)    Comments SOB, leg cramps SOB, leg knee pain 2/10    Resting HR 87 bpm 68 bpm    Resting BP 118/62 108/60    Resting Oxygen Saturation  94 % 95 %    Exercise Oxygen Saturation  during 6 min walk 90 % 89 %  Educated patient on O2 values- obtain pulse ox    Max Ex. HR 102 bpm 98 bpm    Max Ex. BP 158/64 142/60    2 Minute Post BP 110/60 100/60              Nutrition & Weight - Outcomes:  Pre Biometrics - 02/04/22 1357       Pre Biometrics   Height 5' 2"  (1.575 m)    Weight 220 lb 1.6 oz (99.8 kg)    BMI (Calculated) 40.25    Single Leg Stand 7.2 seconds             Post Biometrics -  05/13/22 1447        Post  Biometrics   Height 5' 2"  (1.575 m)    Weight 223 lb 6.4 oz (101.3 kg)    BMI (Calculated) 40.85             Goals reviewed with patient; copy given to patient.

## 2022-05-15 DIAGNOSIS — C50919 Malignant neoplasm of unspecified site of unspecified female breast: Secondary | ICD-10-CM

## 2022-05-15 NOTE — Progress Notes (Signed)
Daily Session Note  Patient Details  Name: Jocelyne Reinertsen MRN: [id number] Date of Birth: Jan 05, 1956 Referring Provider:   April Manson [facility] Associated Rehabilitation & Exercise from 02/04/2022 in [facility] Cardiac and Pulmonary Rehab  Referring Provider Randa Evens MD       Encounter Date: 05/15/2022  Check In:  Session Check In - 05/15/22 1224       Check-In   Supervising physician immediately available to respond to emergencies See telemetry face sheet for immediately available ER MD    Location [facility]-Cardiac & Pulmonary Rehab    Staff Present Carson Myrtle, BS, RRT, CPFT;Jahzaria Vary, MPA, RN;Susanne Bice, RN, BSN, CCRP    Virtual Visit No    Medication changes reported     Yes    Comments no longer taking spironalactone    Fall or balance concerns reported    No    Warm-up and Cool-down Performed on first and last piece of equipment    Resistance Training Performed Yes    VAD Patient? No    PAD/SET Patient? No      Pain Assessment   Currently in Pain? No/denies                Social History   Tobacco Use  Smoking Status Never  Smokeless Tobacco Never    Goals Met:  Independence with exercise equipment Exercise tolerated well No report of concerns or symptoms today Strength training completed today  Goals Unmet:  Not Applicable  Comments: Pt able to follow exercise prescription today without complaint.  Will continue to monitor for progression.    Dr. Emily Filbert is Medical Director for [place].  Dr. Ottie Glazier is Medical Director for [facility] Pulmonary Rehabilitation.

## 2022-06-03 ENCOUNTER — Other Ambulatory Visit: Payer: Self-pay | Admitting: Nephrology

## 2022-06-03 ENCOUNTER — Encounter: Payer: Medicare Other | Attending: Internal Medicine

## 2022-06-03 DIAGNOSIS — N179 Acute kidney failure, unspecified: Secondary | ICD-10-CM

## 2022-06-03 DIAGNOSIS — C50919 Malignant neoplasm of unspecified site of unspecified female breast: Secondary | ICD-10-CM | POA: Insufficient documentation

## 2022-06-03 DIAGNOSIS — I10 Essential (primary) hypertension: Secondary | ICD-10-CM

## 2022-06-03 DIAGNOSIS — D631 Anemia in chronic kidney disease: Secondary | ICD-10-CM

## 2022-06-03 DIAGNOSIS — N1832 Chronic kidney disease, stage 3b: Secondary | ICD-10-CM

## 2022-06-03 NOTE — Progress Notes (Signed)
Daily Session Note  Patient Details  Name: Christina Mcclain MRN: [id number] Date of Birth: [birth year] Referring Provider:   April Manson Cancer Associated Rehabilitation & Exercise from 02/04/2022 in [facility] Cardiac and Pulmonary Rehab  Referring Provider Randa Evens MD       Encounter Date: 06/03/2022  Check In:  Session Check In - 06/03/22 1209       Check-In   Supervising physician immediately available to respond to emergencies See telemetry face sheet for immediately available ER MD    Location [facility]-Cardiac & Pulmonary Rehab    Staff Present Birdie Sons, MPA, RN;Melissa [place], RDN, LDN;Jessica Luan Pulling, MA, RCEP, CCRP, CCET    Virtual Visit No    Medication changes reported     No    Fall or balance concerns reported    No    Warm-up and Cool-down Performed on first and last piece of equipment    Resistance Training Performed Yes    VAD Patient? No    PAD/SET Patient? No      Pain Assessment   Currently in Pain? No/denies                Social History   Tobacco Use  Smoking Status Never  Smokeless Tobacco Never    Goals Met:  Independence with exercise equipment Exercise tolerated well No report of concerns or symptoms today Strength training completed today  Goals Unmet:  Not Applicable  Comments:   Christina Mcclain graduated today from  rehab with 24 sessions completed.  Details of the patient's exercise prescription and what She needs to do in order to continue the prescription and progress were discussed with patient.  Patient was given a copy of prescription and goals.  Patient verbalized understanding.  Christina Mcclain plans to continue to exercise by walking at home.     Dr. Emily Filbert is Medical Director for [place].  Dr. Ottie Glazier is Medical Director for [facility] Pulmonary Rehabilitation.

## 2022-06-09 DIAGNOSIS — C50919 Malignant neoplasm of unspecified site of unspecified female breast: Secondary | ICD-10-CM

## 2022-06-09 NOTE — Progress Notes (Signed)
CARE Discharge Progress Report  Patient Details  Name: Christina Mcclain MRN: [id number] Date of Birth: October 22, 1956 Referring Provider:   April Manson Cancer Associated Rehabilitation & Exercise from 02/04/2022 in [facility] Cardiac and Pulmonary Rehab  Referring Provider Randa Evens MD        Number of Visits: 24  Reason for Discharge:  Patient reached a stable level of exercise. Patient independent in their exercise. Patient has met program and personal goals.  Smoking History:  Social History   Tobacco Use  Smoking Status Never  Smokeless Tobacco Never    Diagnosis:  Invasive carcinoma of breast ([place])  ADL UCSD:   Initial Exercise Prescription:  Initial Exercise Prescription - 02/04/22 1400       Date of Initial Exercise RX and Referring Provider   Date 02/04/22    Referring Provider Randa Evens MD      Oxygen   Maintain Oxygen Saturation 88% or higher      NuStep   Level 1    SPM 80    Minutes 15    METs 1.5      REL-XR   Level 1    Speed 60    Minutes 15    METs 1.5      Biostep-RELP   Level 1    SPM 50    Minutes 15    METs 1.5      Track   Laps 14    Minutes 15    METs 1.76      Prescription Details   Frequency (times per week) 2    Duration Progress to 30 minutes of continuous aerobic without signs/symptoms of physical distress      Intensity   THRR 40-80% of Max Heartrate 113-139    Ratings of Perceived Exertion 11-13    Perceived Dyspnea 0-4      Progression   Progression Continue to progress workloads to maintain intensity without signs/symptoms of physical distress.      Resistance Training   Training Prescription Yes    Weight 3 lb    Reps 10-15             Discharge Exercise Prescription (Final Exercise Prescription Changes):  Exercise Prescription Changes - 05/13/22 1400       Response to Exercise   Blood Pressure (Admit) 128/62    Blood Pressure (Exercise) 146/68    Blood Pressure (Exit) 118/60    Heart Rate  (Admit) 68 bpm    Heart Rate (Exercise) 100 bpm    Heart Rate (Exit) 65 bpm    Oxygen Saturation (Admit) 96 %    Oxygen Saturation (Exercise) 91 %    Oxygen Saturation (Exit) 95 %    Rating of Perceived Exertion (Exercise) 13    Comments close to graduation    Duration Continue with 30 min of aerobic exercise without signs/symptoms of physical distress.    Intensity THRR unchanged      Progression   Progression Continue to progress workloads to maintain intensity without signs/symptoms of physical distress.    Average METs 1.95      Resistance Training   Training Prescription Yes    Weight 3 lb    Reps 10-15      Interval Training   Interval Training No      NuStep   Level 3    Minutes 15    METs 2.4      Arm Ergometer   Level 1.5    Minutes 15  METs 1.1      Biostep-RELP   Level 1    Minutes 15    METs 1      Track   Laps 22   30 laps in 30 minutes   Minutes 15    METs 2.2      Oxygen   Maintain Oxygen Saturation 88% or higher             Functional Capacity:  6 Minute Walk     Row Name 02/04/22 1402 05/08/22 1331       6 Minute Walk   Phase Initial Discharge    Distance 740 feet 1000 feet    Distance % Change -- 35 %    Distance Feet Change -- 260 ft    Walk Time 5.5 minutes  Break 2:54-3:11; 4:36-4:50 6 minutes    # of Rest Breaks 2 0    MPH 1.52 1.89    METS 1.57 1.84    RPE 13 13    Perceived Dyspnea  3 1    VO2 Peak 5.52 6.45    Symptoms Yes (comment) Yes (comment)    Comments SOB, leg cramps SOB, leg knee pain 2/10    Resting HR 87 bpm 68 bpm    Resting BP 118/62 108/60    Resting Oxygen Saturation  94 % 95 %    Exercise Oxygen Saturation  during 6 min walk 90 % 89 %  Educated patient on O2 values- obtain pulse ox    Max Ex. HR 102 bpm 98 bpm    Max Ex. BP 158/64 142/60    2 Minute Post BP 110/60 100/60               Nutrition & Weight - Outcomes:  Pre Biometrics - 02/04/22 1357       Pre Biometrics   Height _0   (1.575 m)    Weight 220 lb 1.6 oz (99.8 kg)    BMI (Calculated) 40.25    Single Leg Stand 7.2 seconds             Post Biometrics - 05/13/22 1447        Post  Biometrics   Height _1  (1.575 m)    Weight 223 lb 6.4 oz (101.3 kg)    BMI (Calculated) 40.85            Goals reviewed with patient; copy given to patient.

## 2022-06-11 ENCOUNTER — Ambulatory Visit
Admission: RE | Admit: 2022-06-11 | Discharge: 2022-06-11 | Disposition: A | Discharge status: Home / Self Care | Payer: Medicare Other | Source: Ambulatory Visit | Attending: Nephrology | Admitting: Nephrology

## 2022-06-11 DIAGNOSIS — N189 Chronic kidney disease, unspecified: Secondary | ICD-10-CM | POA: Insufficient documentation

## 2022-06-11 DIAGNOSIS — I10 Essential (primary) hypertension: Secondary | ICD-10-CM | POA: Insufficient documentation

## 2022-06-11 DIAGNOSIS — N1832 Chronic kidney disease, stage 3b: Secondary | ICD-10-CM | POA: Diagnosis present

## 2022-06-11 DIAGNOSIS — N179 Acute kidney failure, unspecified: Secondary | ICD-10-CM | POA: Insufficient documentation

## 2022-06-11 DIAGNOSIS — D631 Anemia in chronic kidney disease: Secondary | ICD-10-CM | POA: Diagnosis present

## 2022-06-16 ENCOUNTER — Other Ambulatory Visit: Payer: Self-pay

## 2022-06-17 ENCOUNTER — Other Ambulatory Visit: Payer: Self-pay

## 2022-07-16 ENCOUNTER — Encounter: Payer: Self-pay | Admitting: Oncology

## 2022-07-16 ENCOUNTER — Inpatient Hospital Stay: Payer: Medicare Other | Attending: Oncology

## 2022-07-16 ENCOUNTER — Inpatient Hospital Stay (HOSPITAL_BASED_OUTPATIENT_CLINIC_OR_DEPARTMENT_OTHER): Payer: Medicare Other | Admitting: Oncology

## 2022-07-16 VITALS — BP 135/55 | HR 49 | Temp 96.8°F | Resp 18 | Wt 209.4 lb

## 2022-07-16 DIAGNOSIS — Z08 Encounter for follow-up examination after completed treatment for malignant neoplasm: Secondary | ICD-10-CM | POA: Diagnosis not present

## 2022-07-16 DIAGNOSIS — Z170 Estrogen receptor positive status [ER+]: Secondary | ICD-10-CM | POA: Insufficient documentation

## 2022-07-16 DIAGNOSIS — Z79811 Long term (current) use of aromatase inhibitors: Secondary | ICD-10-CM | POA: Diagnosis not present

## 2022-07-16 DIAGNOSIS — C50911 Malignant neoplasm of unspecified site of right female breast: Secondary | ICD-10-CM | POA: Insufficient documentation

## 2022-07-16 DIAGNOSIS — R5383 Other fatigue: Secondary | ICD-10-CM | POA: Diagnosis not present

## 2022-07-16 DIAGNOSIS — C50919 Malignant neoplasm of unspecified site of unspecified female breast: Secondary | ICD-10-CM

## 2022-07-16 DIAGNOSIS — Z79899 Other long term (current) drug therapy: Secondary | ICD-10-CM | POA: Insufficient documentation

## 2022-07-16 DIAGNOSIS — Z853 Personal history of malignant neoplasm of breast: Secondary | ICD-10-CM

## 2022-07-16 LAB — CBC WITH DIFFERENTIAL/PLATELET
Abs Immature Granulocytes: 0 10*3/uL (ref 0.00–0.07)
Basophils Absolute: 0.1 10*3/uL (ref 0.0–0.1)
Basophils Relative: 2 %
Eosinophils Absolute: 0.1 10*3/uL (ref 0.0–0.5)
Eosinophils Relative: 2 %
HCT: 34.4 % — ABNORMAL LOW (ref 36.0–46.0)
Hemoglobin: 10.2 g/dL — ABNORMAL LOW (ref 12.0–15.0)
Immature Granulocytes: 0 %
Lymphocytes Relative: 47 %
Lymphs Abs: 1.6 10*3/uL (ref 0.7–4.0)
MCH: 24.9 pg — ABNORMAL LOW (ref 26.0–34.0)
MCHC: 29.7 g/dL — ABNORMAL LOW (ref 30.0–36.0)
MCV: 83.9 fL (ref 80.0–100.0)
Monocytes Absolute: 0.5 10*3/uL (ref 0.1–1.0)
Monocytes Relative: 15 %
Neutro Abs: 1.2 10*3/uL — ABNORMAL LOW (ref 1.7–7.7)
Neutrophils Relative %: 34 %
Platelets: 161 10*3/uL (ref 150–400)
RBC: 4.1 MIL/uL (ref 3.87–5.11)
RDW: 17 % — ABNORMAL HIGH (ref 11.5–15.5)
WBC: 3.5 10*3/uL — ABNORMAL LOW (ref 4.0–10.5)
nRBC: 0 % (ref 0.0–0.2)

## 2022-07-16 LAB — COMPREHENSIVE METABOLIC PANEL
ALT: 16 U/L (ref 0–44)
AST: 19 U/L (ref 15–41)
Albumin: 4 g/dL (ref 3.5–5.0)
Alkaline Phosphatase: 65 U/L (ref 38–126)
Anion gap: 9 (ref 5–15)
BUN: 18 mg/dL (ref 8–23)
CO2: 29 mmol/L (ref 22–32)
Calcium: 9.7 mg/dL (ref 8.9–10.3)
Chloride: 102 mmol/L (ref 98–111)
Creatinine, Ser: 1.35 mg/dL — ABNORMAL HIGH (ref 0.44–1.00)
GFR, Estimated: 43 mL/min — ABNORMAL LOW (ref >60)
Glucose, Bld: 89 mg/dL (ref 70–99)
Potassium: 4.1 mmol/L (ref 3.5–5.1)
Sodium: 140 mmol/L (ref 135–145)
Total Bilirubin: 0.8 mg/dL (ref 0.3–1.2)
Total Protein: 6.9 g/dL (ref 6.5–8.1)

## 2022-07-16 NOTE — Progress Notes (Signed)
Pt will like to discuss if she needs to continue taking vitamin d & b12. Pt also states that at times her left leg causes lots of pain down to her toes. Will take tylenol whenever needed forgot she has tramadol. All day sunday and monday was causing lots of pain.

## 2022-07-16 NOTE — Progress Notes (Signed)
Hematology/Oncology Consult note [facility]  Telephone:([phone] [phone]  Patient Care Team: Perrin Maltese, MD as PCP - General (Internal Medicine) Theodore Demark, RN (Inactive) as Registered Nurse (Oncology) Sindy Guadeloupe, MD as Consulting Physician (Hematology and Oncology) Sindy Guadeloupe, MD as Consulting Physician (Hematology and Oncology) Sindy Guadeloupe, MD as Consulting Physician (Hematology and Oncology) Noreene Filbert, MD as Consulting Physician (Radiation Oncology) Herbert Pun, MD as Consulting Physician (General Surgery)   Name of the patient: Christina Mcclain  [id number]  Mar 28, 1956   Date of visit: 07/16/22  Diagnosis- 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 It showed 2 suspicious lesions in the right breast. Diagnostic mammogram and ultrasound confirmed 2 masses in the 9 o'clock position of the right breast measuring 1.1 x 1.2 x 1.2 cm and the other one measuring 0.8 x 0.8 x 0.9 cm. These 2 masses were 1.7 cm apart. Sonographic evaluation of the right axilla did not show any enlarged adenopathy. 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    Baseline MUGA scan showed EF of 58%.  MRI showed two distinct masses in the right breast 1.5 cm and 1.2 cm 2 cm apart no additional suspicious masses or abnormal enhancement identified.  No suspicious adenopathy   6 cycles of neoadjuvant TCHP chemotherapy completed on 02/29/2020.    Final pathology showed residual tumor of 5 mm.Negative margins.  1 sentinel lymph  node negative for malignancy.  Overall cancer cellularity 1%.  Percentage of cancer that is in situ 0%.  [id number]   Patient is started on adjuvant Kadcyla on 04/26/2020.  Treatment complicated by thrombocytopenia despite dose reduction. Patient switched to adjuvant Herceptin. Perjeta not continued due to significant diarrhea in the neoadjuvant setting.  Patient completed adjuvant Herceptin in February 2022    Interval history-patient reports doing well.  She denies any specific complaints at this time other than her left knee pain which has been bothering her for a while.  She has modified her diet and switch to a plant-based diet.  She is working on her weight loss  ECOG PS- 1 Pain scale- 3   Review of systems- Review of Systems  Constitutional:  Negative for chills, fever, malaise/fatigue and weight loss.  HENT:  Negative for congestion, ear discharge and nosebleeds.   Eyes:  Negative for blurred vision.  Respiratory:  Negative for cough, hemoptysis, sputum production, shortness of breath and wheezing.   Cardiovascular:  Negative for chest pain, palpitations, orthopnea and claudication.  Gastrointestinal:  Negative for abdominal pain, blood in stool, constipation, diarrhea, heartburn, melena, nausea and vomiting.  Genitourinary:  Negative for dysuria, flank pain, frequency, hematuria and urgency.  Musculoskeletal:  Positive for joint pain. Negative for back pain and myalgias.  Skin:  Negative for rash.  Neurological:  Negative for dizziness, tingling, focal weakness, seizures, weakness and headaches.  Endo/Heme/Allergies:  Does not bruise/bleed easily.  Psychiatric/Behavioral:  Negative for depression and suicidal ideas. The patient does not have insomnia.       Allergies  Allergen Reactions   Amlodipine Other (See Comments)    edema  Statins Rash    Other reaction(s): Muscle Pain Possible hives also     Past Medical History:  Diagnosis Date   Adverse effect of anesthesia     hard to awaken   Anemia    Arthritis    Asthma    Asthma without status asthmaticus    unspecified; Take advair each night   Breast cancer ([place]) 2021   right breast IMC   Bronchitis    Chronic kidney disease    stage III per dr Holley Raring   Constipation    Diabetes ([place]) 2010   Diabetes mellitus type 2, uncomplicated ([place])    Diabetes mellitus without complication ([place])    Encounter for blood transfusion    hysterectomy 9-10 years ago   Heart murmur    unspecified   Hyperlipidemia    Hypertension 1991   Hypothyroid    Multiple thyroid nodules    Neuromuscular disorder (H[facility])    neuropathy   Osteoarthritis of right hip 02/04/2017   Palpitations    Pernicious anemia    Personal history of chemotherapy 2020-2021   Right breast ca   Personal history of radiation therapy 2021   right breast [facility]   Pneumonia    Sleep apnea    Use C-PAP; can't use mouth piece. Anxiety   Thyroid disease    nodules   Thyroid nodule    x2   Urinary urgency      Past Surgical History:  Procedure Laterality Date   ABDOMINAL HYSTERECTOMY  2011   partial   ABDOMINAL SURGERY     BREAST BIOPSY Right 10/02/2020   affirm bx, x marker, fat necrosis   BREAST BIOPSY Right 10/19/2019   two masses at 9:00 [place] bx, coil marker and venus marker, IMC for both   BREAST LUMPECTOMY Right 03/28/2020   bracketing of two IMC masses at 9:00.  Venus marker was pushed medially during wire loc. Remains in breast.  Clear surgical margins. Negative Lymph node   CARPAL TUNNEL RELEASE Left 2008   CARPAL TUNNEL RELEASE Right    CHOLECYSTECTOMY  1982   COLONOSCOPY WITH PROPOFOL N/A 10/05/2017   Procedure: COLONOSCOPY WITH PROPOFOL;  Surgeon: Manya Silvas, MD;  Location: [facility] ENDOSCOPY;  Service: Endoscopy;  Laterality: N/A;   COLONOSCOPY WITH PROPOFOL N/A 08/09/2021   Procedure: COLONOSCOPY WITH PROPOFOL;  Surgeon: Annamaria Helling, DO;  Location: [facility] ENDOSCOPY;  Service: Gastroenterology;  Laterality: N/A;    DIAGNOSTIC LAPAROSCOPY     ESOPHAGOGASTRODUODENOSCOPY (EGD) WITH PROPOFOL N/A 08/09/2021   Procedure: ESOPHAGOGASTRODUODENOSCOPY (EGD) WITH PROPOFOL;  Surgeon: Annamaria Helling, DO;  Location: [facility] ENDOSCOPY;  Service: Gastroenterology;  Laterality: N/A;   JOINT REPLACEMENT Right 02/09/2017   TOTAL HIP REPLACEMENT, DR. CARTER. [place]   KNEE ARTHROSCOPY  1990s   PARTIAL MASTECTOMY WITH NEEDLE LOCALIZATION AND AXILLARY SENTINEL LYMPH NODE BX Right 03/28/2020   Procedure: PARTIAL MASTECTOMY WITH NEEDLE LOCALIZATION AND AXILLARY SENTINEL LYMPH NODE BX;  Surgeon: Herbert Pun, MD;  Location: [facility] ORS;  Service: General;  Laterality: Right;   PORTACATH PLACEMENT Left 11/07/2019   Procedure: INSERTION PORT-A-CATH;  Surgeon: Herbert Pun, MD;  Location: [facility] ORS;  Service: General;  Laterality: Left;   TOTAL HIP ARTHROPLASTY Left 06/23/2017   Procedure: TOTAL HIP ARTHROPLASTY ANTERIOR APPROACH;  Surgeon: Hessie Knows, MD;  Location: [facility] ORS;  Service: Orthopedics;  Laterality: Left;    Social History   Socioeconomic History   Marital status: Married    Spouse name: Not on file   Number of  children: 2   Years of education: some college   Highest education level: Not on file  Occupational History   Occupation: retired    Comment: [place] Use   Smoking status: Never   Smokeless tobacco: Never  Scientific laboratory technician Use: Never used  Substance and Sexual Activity   Alcohol use: No   Drug use: No   Sexual activity: Not Currently  Other Topics Concern   Not on file  Social History Narrative   Not on file   Social Determinants of Health   Financial Resource Strain: Not on file  Food Insecurity: Not on file  Transportation Needs: Not on file  Physical Activity: Not on file  Stress: Not on file  Social Connections: Not on file  Intimate Partner Violence: Not on file    Family History  Problem Relation Age of Onset   Heart disease  Mother    Hypertension Mother    Arrhythmia Mother    Heart attack Mother    Diabetes Mother    Hypertension Father    Parkinson's disease Father    Coronary artery disease Maternal Uncle    Heart attack Maternal Uncle    Diabetes Sister    Diabetes Brother    Heart disease Brother    Breast cancer Neg Hx      Current Outpatient Medications:    acetaminophen (TYLENOL) 500 MG tablet, Take 500 mg by mouth every 6 (six) hours as needed., Disp: , Rfl:    Calcium Carbonate-Vitamin D (CALCIUM 600+D PO), Take by mouth., Disp: , Rfl:    CALCIUM CITRATE PO, Take 1,000 mg by mouth daily., Disp: , Rfl:    carvedilol (COREG) 25 MG tablet, Take 25 mg by mouth 2 (two) times daily with a meal. , Disp: , Rfl:    cholecalciferol (VITAMIN D) 1000 units tablet, Take 2,000 Units by mouth daily. , Disp: , Rfl:    ezetimibe (ZETIA) 10 MG tablet, Take 10 mg by mouth daily., Disp: , Rfl:    Fluticasone-Salmeterol (ADVAIR) 250-50 MCG/DOSE AEPB, Inhale 1 puff into the lungs 2 (two) times daily. , Disp: , Rfl:    letrozole (FEMARA) 2.5 MG tablet, TAKE 1 TABLET(2.5 MG) BY MOUTH DAILY, Disp: 90 tablet, Rfl: 2   lidocaine-prilocaine (EMLA) cream, APPLY 1 APPLICATION TOPICALLY AS NEEDED, Disp: 30 g, Rfl: 1   loratadine (CLARITIN) 10 MG tablet, Take 10 mg by mouth daily as needed for allergies. , Disp: , Rfl:    magnesium oxide (MAG-OX) 400 MG tablet, Take 2 tablets by mouth daily., Disp: , Rfl:    pregabalin (LYRICA) 25 MG capsule, Take 25 mg by mouth 2 (two) times daily., Disp: , Rfl:    solifenacin (VESICARE) 5 MG tablet, Take 10 mg by mouth every evening. , Disp: , Rfl:    albuterol (PROVENTIL HFA;VENTOLIN HFA) 108 (90 Base) MCG/ACT inhaler, Inhale 2 puffs into the lungs every 6 (six) hours as needed for wheezing or shortness of breath. (Patient not taking: Reported on 06/05/2021), Disp: , Rfl:    FARXIGA 10 MG TABS tablet, Take 10 mg by mouth daily. (Patient not taking: Reported on 07/16/2022), Disp: , Rfl:     metFORMIN (GLUCOPHAGE-XR) 500 MG 24 hr tablet, Take 500 mg by mouth daily with supper.  (Patient not taking: Reported on 02/03/2022), Disp: , Rfl:    nystatin (MYCOSTATIN/NYSTOP) powder, Apply 1 application topically 3 (three) times daily. (Patient not taking: Reported on 06/05/2021), Disp: 60 g,  Rfl: 2   pantoprazole (PROTONIX) 40 MG tablet, , Disp: , Rfl:    spironolactone (ALDACTONE) 25 MG tablet, Take 25 mg by mouth 2 (two) times daily. (Patient not taking: Reported on 07/16/2022), Disp: , Rfl:    telmisartan-hydrochlorothiazide (MICARDIS HCT) 80-25 MG tablet, Take 1 tablet by mouth every morning.  (Patient not taking: Reported on 06/05/2021), Disp: , Rfl:    traMADol (ULTRAM) 50 MG tablet, Take 1 tablet (50 mg total) by mouth every 6 (six) hours as needed. (Patient not taking: Reported on 07/16/2022), Disp: 30 tablet, Rfl: 0   vitamin B-12 (CYANOCOBALAMIN) 1000 MCG tablet, Take 1,000 mcg by mouth daily. (Patient not taking: Reported on 02/03/2022), Disp: , Rfl:  No current facility-administered medications for this visit.  Facility-Administered Medications Ordered in Other Visits:    sodium chloride flush (NS) 0.9 % injection 10 mL, 10 mL, Intravenous, PRN, Sindy Guadeloupe, MD, 10 mL at 01/05/20 1344   sodium chloride flush (NS) 0.9 % injection 10 mL, 10 mL, Intravenous, PRN, Sindy Guadeloupe, MD, 10 mL at 05/17/20 0858  Physical exam:  Vitals:   07/16/22 1042  BP: (!) 135/55  Pulse: (!) 49  Resp: 18  Temp: (!) 96.8 F (36 C)  SpO2: 98%  Weight: 209 lb 6.4 oz (95 kg)   Physical Exam Constitutional:      General: She is not in acute distress. Cardiovascular:     Rate and Rhythm: Normal rate and regular rhythm.     Heart sounds: Normal heart sounds.  Pulmonary:     Effort: Pulmonary effort is normal.     Breath sounds: Normal breath sounds.  Abdominal:     General: Bowel sounds are normal.     Palpations: Abdomen is soft.  Skin:    General: Skin is warm and dry.  Neurological:      Mental Status: She is alert and oriented to person, place, and time.    Breast exam was performed in seated and lying down position. Patient is status post right lumpectomy with a well-healed surgical scar. No evidence of any palpable masses. No evidence of axillary adenopathy. No evidence of any palpable masses or lumps in the left breast. No evidence of leftt axillary adenopathy      Latest Ref Rng & Units 07/16/2022   10:17 AM  CMP  Glucose 70 - 99 mg/dL 89   BUN 8 - 23 mg/dL 18   Creatinine 0.44 - 1.00 mg/dL 1.35   Sodium 135 - 145 mmol/L 140   Potassium 3.5 - 5.1 mmol/L 4.1   Chloride 98 - 111 mmol/L 102   CO2 22 - 32 mmol/L 29   Calcium 8.9 - 10.3 mg/dL 9.7   Total Protein 6.5 - 8.1 g/dL 6.9   Total Bilirubin 0.3 - 1.2 mg/dL 0.8   Alkaline Phos 38 - 126 U/L 65   AST 15 - 41 U/L 19   ALT 0 - 44 U/L 16       Latest Ref Rng & Units 07/16/2022   10:17 AM  CBC  WBC 4.0 - 10.5 K/uL 3.5   Hemoglobin 12.0 - 15.0 g/dL 10.2   Hematocrit 36.0 - 46.0 % 34.4   Platelets 150 - 400 K/uL 161      Assessment and plan- Patient is a 66 y.o. 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  She is s/p 6 cycles of neoadjuvant TCHP chemotherapy  with residual 5 mm tumor.  She had persistent cytopenias and thrombocytopenia with Kadcyla and was switched to single agent maintenance Herceptin which she completed in February 2022.  She is here for routine follow-up of breast cancer  Clinically patient is doing well with no concerning signs and symptoms of recurrence based on today's exam.  She will be due for a mammogram in November 2023 which we will schedule.  Bone density scan remains normal.  Patient will continue letrozole at this time.  I will see her back in 6 months with labs Visit Diagnosis 1. Invasive carcinoma of breast ([place])   2. Other fatigue   3. Malignant neoplasm of female breast, unspecified  estrogen receptor status, unspecified laterality, unspecified site of breast ([place])   4. Encounter for follow-up surveillance of breast cancer      Dr. Randa Evens, MD, MPH [facility] at [facility] [phone] 07/16/2022 4:18 PM

## 2022-07-17 ENCOUNTER — Other Ambulatory Visit: Payer: Self-pay

## 2022-07-22 ENCOUNTER — Ambulatory Visit: Payer: Medicare Other

## 2022-07-22 ENCOUNTER — Other Ambulatory Visit: Payer: Medicare Other

## 2022-07-22 ENCOUNTER — Ambulatory Visit: Payer: Medicare Other | Admitting: Oncology

## 2022-07-23 ENCOUNTER — Ambulatory Visit: Payer: Medicare Other

## 2022-07-23 ENCOUNTER — Encounter: Payer: Self-pay | Admitting: Radiation Oncology

## 2022-07-23 ENCOUNTER — Ambulatory Visit
Admission: RE | Admit: 2022-07-23 | Discharge: 2022-07-23 | Disposition: A | Discharge status: Home / Self Care | Payer: Medicare Other | Source: Ambulatory Visit | Attending: Radiation Oncology | Admitting: Radiation Oncology

## 2022-07-23 VITALS — BP 127/71 | HR 74 | Temp 98.1°F | Resp 16 | Wt 208.0 lb

## 2022-07-23 DIAGNOSIS — C50411 Malignant neoplasm of upper-outer quadrant of right female breast: Secondary | ICD-10-CM | POA: Diagnosis not present

## 2022-07-23 DIAGNOSIS — Z79811 Long term (current) use of aromatase inhibitors: Secondary | ICD-10-CM | POA: Diagnosis not present

## 2022-07-23 DIAGNOSIS — Z170 Estrogen receptor positive status [ER+]: Secondary | ICD-10-CM | POA: Diagnosis not present

## 2022-07-23 DIAGNOSIS — Z923 Personal history of irradiation: Secondary | ICD-10-CM | POA: Diagnosis not present

## 2022-07-23 NOTE — Progress Notes (Signed)
Radiation Oncology Follow up Note  Name: Christina Mcclain   Date:   07/23/2022 MRN:  [id number] DOB: 04/26/1956    This 66 y.o. female presents to the clinic today for 2-year follow-up status post whole breast radiation to right breast for triple positive invasive mammary carcinoma.  REFERRING PROVIDER: Perrin Maltese, MD  HPI: Patient is a 66 year old female now out 2 years having completed whole breast radiation to her right breast for triple positive invasive mammary carcinoma.  Seen today in routine follow-up she is doing well.  She still has some occasional tenderness no cough or bone pain.  She had mammograms.  Back in November which were BI-RADS 2 benign which I have reviewed.  She is currently on letrozole tolerating it well without side effect.  COMPLICATIONS OF TREATMENT: none  FOLLOW UP COMPLIANCE: keeps appointments   PHYSICAL EXAM:  BP 127/71 (BP Location: Left Arm, Patient Position: Sitting, Cuff Size: Normal)   Pulse 74   Temp 98.1 F (36.7 C) (Tympanic)   Resp 16   Wt 208 lb (94.3 kg)   BMI 38.04 kg/m  Lungs are clear to A&P cardiac examination essentially unremarkable with regular rate and rhythm. No dominant mass or nodularity is noted in either breast in 2 positions examined. Incision is well-healed. No axillary or supraclavicular adenopathy is appreciated. Cosmetic result is excellent.  Well-developed well-nourished patient in NAD. HEENT reveals PERLA, EOMI, discs not visualized.  Oral cavity is clear. No oral mucosal lesions are identified. Neck is clear without evidence of cervical or supraclavicular adenopathy. Lungs are clear to A&P. Cardiac examination is essentially unremarkable with regular rate and rhythm without murmur rub or thrill. Abdomen is benign with no organomegaly or masses noted. Motor sensory and DTR levels are equal and symmetric in the upper and lower extremities. Cranial nerves II through XII are grossly intact. Proprioception is intact. No  peripheral adenopathy or edema is identified. No motor or sensory levels are noted. Crude visual fields are within normal range.  RADIOLOGY RESULTS: Mammograms reviewed compatible with above-stated findings  PLAN: Present time patient is doing well out 2 years with no evidence of disease.  I am pleased with her overall progress.  I asked to see her back in 1 year for follow-up.  She is already scheduled for follow-up mammograms.  She continues on letrozole without side effect.  Patient is to call with any concerns.  I would like to take this opportunity to thank you for allowing me to participate in the care of your patient.Noreene Filbert, MD

## 2022-07-24 ENCOUNTER — Other Ambulatory Visit: Payer: Self-pay

## 2022-07-25 ENCOUNTER — Inpatient Hospital Stay: Payer: Medicare Other | Attending: Oncology

## 2022-07-25 DIAGNOSIS — Z452 Encounter for adjustment and management of vascular access device: Secondary | ICD-10-CM | POA: Insufficient documentation

## 2022-07-25 DIAGNOSIS — C50911 Malignant neoplasm of unspecified site of right female breast: Secondary | ICD-10-CM | POA: Insufficient documentation

## 2022-07-25 DIAGNOSIS — Z170 Estrogen receptor positive status [ER+]: Secondary | ICD-10-CM | POA: Insufficient documentation

## 2022-07-30 ENCOUNTER — Inpatient Hospital Stay: Payer: Medicare Other

## 2022-07-30 DIAGNOSIS — Z95828 Presence of other vascular implants and grafts: Secondary | ICD-10-CM

## 2022-07-30 DIAGNOSIS — Z452 Encounter for adjustment and management of vascular access device: Secondary | ICD-10-CM | POA: Diagnosis present

## 2022-07-30 DIAGNOSIS — Z170 Estrogen receptor positive status [ER+]: Secondary | ICD-10-CM | POA: Diagnosis not present

## 2022-07-30 DIAGNOSIS — C50911 Malignant neoplasm of unspecified site of right female breast: Secondary | ICD-10-CM | POA: Diagnosis present

## 2022-07-30 MED ORDER — SODIUM CHLORIDE 0.9% FLUSH
10.0000 mL | Freq: Once | INTRAVENOUS | Status: AC
  Administered 2022-07-30: 10 mL via INTRAVENOUS
  Filled 2022-07-30: qty 10

## 2022-07-30 MED ORDER — HEPARIN SOD (PORK) LOCK FLUSH 100 UNIT/ML IV SOLN
500.0000 [IU] | Freq: Once | INTRAVENOUS | Status: AC
  Administered 2022-07-30: 500 [IU] via INTRAVENOUS
  Filled 2022-07-30: qty 5

## 2022-08-11 ENCOUNTER — Other Ambulatory Visit: Payer: Self-pay | Admitting: Internal Medicine

## 2022-08-11 DIAGNOSIS — E049 Nontoxic goiter, unspecified: Secondary | ICD-10-CM

## 2022-08-15 ENCOUNTER — Ambulatory Visit
Admission: RE | Admit: 2022-08-15 | Discharge: 2022-08-15 | Disposition: A | Discharge status: Home / Self Care | Payer: Medicare Other | Source: Ambulatory Visit | Attending: Internal Medicine | Admitting: Internal Medicine

## 2022-08-15 DIAGNOSIS — E049 Nontoxic goiter, unspecified: Secondary | ICD-10-CM | POA: Diagnosis present

## 2022-10-14 ENCOUNTER — Ambulatory Visit
Admission: RE | Admit: 2022-10-14 | Discharge: 2022-10-14 | Disposition: A | Discharge status: Home / Self Care | Payer: Medicare Other | Source: Ambulatory Visit | Attending: Oncology | Admitting: Oncology

## 2022-10-14 DIAGNOSIS — Z853 Personal history of malignant neoplasm of breast: Secondary | ICD-10-CM | POA: Diagnosis present

## 2022-10-14 DIAGNOSIS — Z79811 Long term (current) use of aromatase inhibitors: Secondary | ICD-10-CM | POA: Insufficient documentation

## 2022-10-14 DIAGNOSIS — Z08 Encounter for follow-up examination after completed treatment for malignant neoplasm: Secondary | ICD-10-CM | POA: Insufficient documentation

## 2022-10-14 DIAGNOSIS — R5383 Other fatigue: Secondary | ICD-10-CM | POA: Diagnosis present

## 2022-10-14 DIAGNOSIS — C50919 Malignant neoplasm of unspecified site of unspecified female breast: Secondary | ICD-10-CM | POA: Insufficient documentation

## 2022-10-17 ENCOUNTER — Other Ambulatory Visit: Payer: Self-pay | Admitting: Oncology

## 2022-10-24 ENCOUNTER — Inpatient Hospital Stay: Payer: Medicare Other | Attending: Oncology

## 2022-10-24 DIAGNOSIS — Z452 Encounter for adjustment and management of vascular access device: Secondary | ICD-10-CM | POA: Diagnosis not present

## 2022-10-24 DIAGNOSIS — Z95828 Presence of other vascular implants and grafts: Secondary | ICD-10-CM

## 2022-10-24 DIAGNOSIS — C50911 Malignant neoplasm of unspecified site of right female breast: Secondary | ICD-10-CM | POA: Diagnosis present

## 2022-10-24 MED ORDER — HEPARIN SOD (PORK) LOCK FLUSH 100 UNIT/ML IV SOLN
500.0000 [IU] | Freq: Once | INTRAVENOUS | Status: AC
  Administered 2022-10-24: 500 [IU] via INTRAVENOUS
  Filled 2022-10-24: qty 5

## 2022-10-24 MED ORDER — SODIUM CHLORIDE 0.9% FLUSH
10.0000 mL | Freq: Once | INTRAVENOUS | Status: AC
  Administered 2022-10-24: 10 mL via INTRAVENOUS
  Filled 2022-10-24: qty 10

## 2022-12-24 ENCOUNTER — Encounter: Payer: Self-pay | Admitting: Oncology

## 2023-01-23 ENCOUNTER — Encounter: Payer: Self-pay | Admitting: Oncology

## 2023-01-23 ENCOUNTER — Inpatient Hospital Stay: Payer: Medicare PPO | Attending: Oncology

## 2023-01-23 ENCOUNTER — Inpatient Hospital Stay (HOSPITAL_BASED_OUTPATIENT_CLINIC_OR_DEPARTMENT_OTHER): Payer: Medicare PPO | Admitting: Oncology

## 2023-01-23 ENCOUNTER — Telehealth: Payer: Self-pay | Admitting: *Deleted

## 2023-01-23 VITALS — BP 166/77 | HR 58 | Temp 96.5°F | Resp 18 | Ht 62.0 in | Wt 213.0 lb

## 2023-01-23 DIAGNOSIS — Z08 Encounter for follow-up examination after completed treatment for malignant neoplasm: Secondary | ICD-10-CM

## 2023-01-23 DIAGNOSIS — Z79811 Long term (current) use of aromatase inhibitors: Secondary | ICD-10-CM | POA: Insufficient documentation

## 2023-01-23 DIAGNOSIS — C50911 Malignant neoplasm of unspecified site of right female breast: Secondary | ICD-10-CM | POA: Diagnosis present

## 2023-01-23 DIAGNOSIS — Z170 Estrogen receptor positive status [ER+]: Secondary | ICD-10-CM | POA: Insufficient documentation

## 2023-01-23 DIAGNOSIS — Z853 Personal history of malignant neoplasm of breast: Secondary | ICD-10-CM

## 2023-01-23 DIAGNOSIS — Z79899 Other long term (current) drug therapy: Secondary | ICD-10-CM | POA: Diagnosis not present

## 2023-01-23 DIAGNOSIS — Z95828 Presence of other vascular implants and grafts: Secondary | ICD-10-CM

## 2023-01-23 DIAGNOSIS — C50919 Malignant neoplasm of unspecified site of unspecified female breast: Secondary | ICD-10-CM

## 2023-01-23 DIAGNOSIS — D649 Anemia, unspecified: Secondary | ICD-10-CM

## 2023-01-23 DIAGNOSIS — R5383 Other fatigue: Secondary | ICD-10-CM

## 2023-01-23 LAB — CBC WITH DIFFERENTIAL/PLATELET
Abs Immature Granulocytes: 0.01 10*3/uL (ref 0.00–0.07)
Basophils Absolute: 0.1 10*3/uL (ref 0.0–0.1)
Basophils Relative: 2 %
Eosinophils Absolute: 0.2 10*3/uL (ref 0.0–0.5)
Eosinophils Relative: 3 %
HCT: 29.2 % — ABNORMAL LOW (ref 36.0–46.0)
Hemoglobin: 8.5 g/dL — ABNORMAL LOW (ref 12.0–15.0)
Immature Granulocytes: 0 %
Lymphocytes Relative: 41 %
Lymphs Abs: 1.9 10*3/uL (ref 0.7–4.0)
MCH: 24.3 pg — ABNORMAL LOW (ref 26.0–34.0)
MCHC: 29.1 g/dL — ABNORMAL LOW (ref 30.0–36.0)
MCV: 83.4 fL (ref 80.0–100.0)
Monocytes Absolute: 0.4 10*3/uL (ref 0.1–1.0)
Monocytes Relative: 9 %
Neutro Abs: 2.1 10*3/uL (ref 1.7–7.7)
Neutrophils Relative %: 45 %
Platelets: 193 10*3/uL (ref 150–400)
RBC: 3.5 MIL/uL — ABNORMAL LOW (ref 3.87–5.11)
RDW: 17.6 % — ABNORMAL HIGH (ref 11.5–15.5)
WBC: 4.7 10*3/uL (ref 4.0–10.5)
nRBC: 0 % (ref 0.0–0.2)

## 2023-01-23 LAB — COMPREHENSIVE METABOLIC PANEL
ALT: 13 U/L (ref 0–44)
AST: 17 U/L (ref 15–41)
Albumin: 3.6 g/dL (ref 3.5–5.0)
Alkaline Phosphatase: 88 U/L (ref 38–126)
Anion gap: 7 (ref 5–15)
BUN: 29 mg/dL — ABNORMAL HIGH (ref 8–23)
CO2: 28 mmol/L (ref 22–32)
Calcium: 9.3 mg/dL (ref 8.9–10.3)
Chloride: 105 mmol/L (ref 98–111)
Creatinine, Ser: 1.22 mg/dL — ABNORMAL HIGH (ref 0.44–1.00)
GFR, Estimated: 48 mL/min — ABNORMAL LOW (ref >60)
Glucose, Bld: 80 mg/dL (ref 70–99)
Potassium: 4.2 mmol/L (ref 3.5–5.1)
Sodium: 140 mmol/L (ref 135–145)
Total Bilirubin: 0.4 mg/dL (ref 0.3–1.2)
Total Protein: 7 g/dL (ref 6.5–8.1)

## 2023-01-23 LAB — VITAMIN B12: Vitamin B-12: 751 pg/mL (ref 180–914)

## 2023-01-23 MED ORDER — HEPARIN SOD (PORK) LOCK FLUSH 100 UNIT/ML IV SOLN
500.0000 [IU] | Freq: Once | INTRAVENOUS | Status: AC
  Administered 2023-01-23: 500 [IU] via INTRAVENOUS
  Filled 2023-01-23: qty 5

## 2023-01-23 MED ORDER — SODIUM CHLORIDE 0.9% FLUSH
10.0000 mL | Freq: Once | INTRAVENOUS | Status: AC
  Administered 2023-01-23: 10 mL via INTRAVENOUS
  Filled 2023-01-23: qty 10

## 2023-01-23 NOTE — Progress Notes (Unsigned)
Patient wants to know about when the port will be removed.

## 2023-01-23 NOTE — Telephone Encounter (Signed)
Dr. Janese Banks says it is time for the portacath to be removed. I filled out the paper and faxed it to Dr. Peyton Najjar office and asked for them to schedule her an appt to get port removed over next few weeks.The transmission went through.

## 2023-01-24 ENCOUNTER — Encounter: Payer: Self-pay | Admitting: Oncology

## 2023-01-24 NOTE — Progress Notes (Signed)
Hematology/Oncology Consult note [facility]  [phone] [phone]  Patient Care Team: Perrin Maltese, MD as PCP - General (Internal Medicine) Theodore Demark, RN (Inactive) as Registered Nurse (Oncology) Sindy Guadeloupe, MD as Consulting Physician (Hematology and Oncology) Sindy Guadeloupe, MD as Consulting Physician (Hematology and Oncology) Sindy Guadeloupe, MD as Consulting Physician (Hematology and Oncology) Noreene Filbert, MD as Consulting Physician (Radiation Oncology) Herbert Pun, MD as Consulting Physician (General Surgery)   Name of the patient: Christina Mcclain  [id number]:[id number]  [birth year]   Date of visit: 01/24/23  Diagnosis- 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 It showed 2 suspicious lesions in the right breast. Diagnostic mammogram and ultrasound confirmed 2 masses in the 9 o'clock position of the right breast measuring 1.1 x 1.2 x 1.2 cm and the other one measuring 0.8 x 0.8 x 0.9 cm. These 2 masses were 1.7 cm apart. Sonographic evaluation of the right axilla did not show any enlarged adenopathy. 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    Baseline MUGA scan showed EF of 58%.  MRI showed two distinct masses in the right breast 1.5 cm and 1.2 cm 2 cm apart no additional suspicious masses or abnormal enhancement identified.  No suspicious adenopathy   6 cycles of neoadjuvant TCHP chemotherapy completed on 02/29/2020.    Final pathology showed residual tumor of 5 mm.Negative margins.  1 sentinel lymph  node negative for malignancy.  Overall cancer cellularity 1%.  Percentage of cancer that is in situ 0%.  [id number]   Patient is started on adjuvant Kadcyla on 04/26/2020.  Treatment complicated by thrombocytopenia despite dose reduction. Patient switched to adjuvant Herceptin. Perjeta not continued due to significant diarrhea in the neoadjuvant setting.  Patient completed adjuvant Herceptin in February 2022.  She started letrozole sometime in July 2021.      Interval history-patient underwent right knee replacement surgery a couple of weeks ago.  She is still having some pain and discomfort while walking.  Denies any specific breast concerns although she has occasional aching pain at the site of her lumpectomy.  Tolerating letrozole well without any significant side effects.  ECOG PS- 1 Pain scale- 3 Opioid associated constipation- no  Review of systems- Review of Systems  Constitutional:  Negative for chills, fever, malaise/fatigue and weight loss.  HENT:  Negative for congestion, ear discharge and nosebleeds.   Eyes:  Negative for blurred vision.  Respiratory:  Negative for cough, hemoptysis, sputum production, shortness of breath and wheezing.   Cardiovascular:  Negative for chest pain, palpitations, orthopnea and claudication.  Gastrointestinal:  Negative for abdominal pain, blood in stool, constipation, diarrhea, heartburn, melena, nausea and vomiting.  Genitourinary:  Negative for dysuria, flank pain, frequency, hematuria and urgency.  Musculoskeletal:  Positive for joint pain. Negative for back pain and myalgias.  Skin:  Negative for rash.  Neurological:  Negative for dizziness, tingling, focal weakness, seizures, weakness and headaches.  Endo/Heme/Allergies:  Does not bruise/bleed easily.  Psychiatric/Behavioral:  Negative for depression and suicidal ideas. The patient does not have insomnia.  Allergies  Allergen Reactions   Amlodipine Other (See Comments)    edema    Statins Rash    Other reaction(s): Muscle Pain Possible hives also     Past Medical History:  Diagnosis Date   Adverse effect of anesthesia    hard to awaken   Anemia    Arthritis    Asthma    Asthma without status asthmaticus    unspecified; Take advair each night   Breast cancer ([place]) 2021   right breast IMC   Bronchitis    Chronic kidney disease    stage III per dr Holley Raring   Constipation    Diabetes ([place]) 2010   Diabetes mellitus type 2, uncomplicated ([place])    Diabetes mellitus without complication ([place])    Encounter for blood transfusion    hysterectomy 9-10 years ago   Heart murmur    unspecified   Hyperlipidemia    Hypertension 1991   Hypothyroid    Multiple thyroid nodules    Neuromuscular disorder ([facility])    neuropathy   Osteoarthritis of right hip 02/04/2017   Palpitations    Pernicious anemia    Personal history of chemotherapy 2020-2021   Right breast ca   Personal history of radiation therapy 2021   right breast [facility]   Pneumonia    Sleep apnea    Use C-PAP; can't use mouth piece. Anxiety   Thyroid disease    nodules   Thyroid nodule    x2   Urinary urgency      Past Surgical History:  Procedure Laterality Date   ABDOMINAL HYSTERECTOMY  2011   partial   ABDOMINAL SURGERY     BREAST BIOPSY Right 10/02/2020   affirm bx, x marker, fat necrosis   BREAST BIOPSY Right 10/19/2019   two masses at 9:00 [place] bx, coil marker and venus marker, IMC for both   BREAST LUMPECTOMY Right 03/28/2020   bracketing of two IMC masses at 9:00.  Venus marker was pushed medially during wire loc. Remains in breast.  Clear surgical margins. Negative Lymph node   CARPAL TUNNEL RELEASE Left 2008   CARPAL TUNNEL RELEASE Right    CHOLECYSTECTOMY  1982   COLONOSCOPY WITH PROPOFOL N/A 10/05/2017   Procedure: COLONOSCOPY WITH PROPOFOL;  Surgeon: Manya Silvas, MD;  Location[facility] ENDOSCOPY;  Service: Endoscopy;  Laterality: N/A;   COLONOSCOPY WITH PROPOFOL N/A 08/09/2021    Procedure: COLONOSCOPY WITH PROPOFOL;  Surgeon: Annamaria Helling, DO;  Location: [facility] ENDOSCOPY;  Service: Gastroenterology;  Laterality: N/A;   DIAGNOSTIC LAPAROSCOPY     ESOPHAGOGASTRODUODENOSCOPY (EGD) WITH PROPOFOL N/A 08/09/2021   Procedure: ESOPHAGOGASTRODUODENOSCOPY (EGD) WITH PROPOFOL;  Surgeon: Annamaria Helling, DO;  Location: [facility] ENDOSCOPY;  Service: Gastroenterology;  Laterality: N/A;   JOINT REPLACEMENT Right 02/09/2017   TOTAL HIP REPLACEMENT, DR. CARTER. [place]   KNEE ARTHROSCOPY  1990s   PARTIAL MASTECTOMY WITH NEEDLE LOCALIZATION AND AXILLARY SENTINEL LYMPH NODE BX Right 03/28/2020   Procedure: PARTIAL MASTECTOMY WITH NEEDLE LOCALIZATION AND AXILLARY SENTINEL LYMPH NODE BX;  Surgeon: Herbert Pun, MD;  Location: [facility] ORS;  Service: General;  Laterality: Right;   PORTACATH PLACEMENT Left 11/07/2019   Procedure: INSERTION PORT-A-CATH;  Surgeon: Herbert Pun, MD;  Location: [facility] ORS;  Service: General;  Laterality: Left;   TOTAL HIP ARTHROPLASTY Left 06/23/2017   Procedure: TOTAL HIP ARTHROPLASTY ANTERIOR APPROACH;  Surgeon: Hessie Knows, MD;  Location: [facility] ORS;  Service: Orthopedics;  Laterality: Left;    Social History   Socioeconomic History  Marital status: Married    Spouse name: Not on file   Number of children: 2   Years of education: some college   Highest education level: Not on file  Occupational History   Occupation: retired    Comment: [employer] Use   Smoking status: Never   Smokeless tobacco: Never  Scientific laboratory technician Use: Never used  Substance and Sexual Activity   Alcohol use: No   Drug use: No   Sexual activity: Not Currently  Other Topics Concern   Not on file  Social History Narrative   Not on file   Social Determinants of Health   Financial Resource Strain: Not on file  Food Insecurity: Not on file  Transportation Needs: Not on file  Physical Activity: Not on file  Stress: Not on  file  Social Connections: Not on file  Intimate Partner Violence: Not on file    Family History  Problem Relation Age of Onset   Heart disease Mother    Hypertension Mother    Arrhythmia Mother    Heart attack Mother    Diabetes Mother    Hypertension Father    Parkinson's disease Father    Coronary artery disease Maternal Uncle    Heart attack Maternal Uncle    Diabetes Sister    Diabetes Brother    Heart disease Brother    Breast cancer Neg Hx      Current Outpatient Medications:    acetaminophen (TYLENOL) 500 MG tablet, Take 500 mg by mouth every 6 (six) hours as needed., Disp: , Rfl:    albuterol (PROVENTIL HFA;VENTOLIN HFA) 108 (90 Base) MCG/ACT inhaler, Inhale 2 puffs into the lungs every 6 (six) hours as needed for wheezing or shortness of breath., Disp: , Rfl:    Calcium Carbonate-Vitamin D (CALCIUM 600+D PO), Take by mouth., Disp: , Rfl:    carvedilol (COREG) 25 MG tablet, Take 25 mg by mouth 2 (two) times daily with a meal. , Disp: , Rfl:    cholecalciferol (VITAMIN D) 1000 units tablet, Take 2,000 Units by mouth daily. , Disp: , Rfl:    ezetimibe (ZETIA) 10 MG tablet, Take 10 mg by mouth daily., Disp: , Rfl:    Fluticasone-Salmeterol (ADVAIR) 250-50 MCG/DOSE AEPB, Inhale 1 puff into the lungs 2 (two) times daily. , Disp: , Rfl:    lidocaine-prilocaine (EMLA) cream, APPLY 1 APPLICATION TOPICALLY AS NEEDED, Disp: 30 g, Rfl: 1   loratadine (CLARITIN) 10 MG tablet, Take 10 mg by mouth daily as needed for allergies. , Disp: , Rfl:    losartan (COZAAR) 25 MG tablet, , Disp: , Rfl:    magnesium oxide (MAG-OX) 400 MG tablet, Take 2 tablets by mouth daily., Disp: , Rfl:    methocarbamol (ROBAXIN) 500 MG tablet, Take 1 tablet by mouth every 8 (eight) hours., Disp: , Rfl:    pregabalin (LYRICA) 25 MG capsule, Take 25 mg by mouth 2 (two) times daily., Disp: , Rfl:    solifenacin (VESICARE) 5 MG tablet, Take 10 mg by mouth every evening. , Disp: , Rfl:    traMADol (ULTRAM) 50 MG  tablet, Take 1 tablet (50 mg total) by mouth every 6 (six) hours as needed., Disp: 30 tablet, Rfl: 0   vitamin B-12 (CYANOCOBALAMIN) 1000 MCG tablet, Take 1,000 mcg by mouth daily., Disp: , Rfl:    CALCIUM CITRATE PO, Take 1,000 mg by mouth daily. (Patient not taking: Reported on 01/23/2023), Disp: , Rfl:  carvedilol (COREG) 25 MG tablet, Take 1 tablet by mouth 2 (two) times daily., Disp: , Rfl:    letrozole (FEMARA) 2.5 MG tablet, TAKE 1 TABLET(2.5 MG) BY MOUTH DAILY (Patient not taking: Reported on 01/23/2023), Disp: 90 tablet, Rfl: 2   nystatin (MYCOSTATIN/NYSTOP) powder, Apply 1 application topically 3 (three) times daily. (Patient not taking: Reported on 01/23/2023), Disp: 60 g, Rfl: 2 No current facility-administered medications for this visit.  Facility-Administered Medications Ordered in Other Visits:    sodium chloride flush (NS) 0.9 % injection 10 mL, 10 mL, Intravenous, PRN, Sindy Guadeloupe, MD, 10 mL at 01/05/20 1344   sodium chloride flush (NS) 0.9 % injection 10 mL, 10 mL, Intravenous, PRN, Sindy Guadeloupe, MD, 10 mL at 05/17/20 0858  Physical exam:  Vitals:   01/23/23 1147 01/23/23 1151  BP: (!) 172/78 (!) 166/77  Pulse: 64 (!) 58  Resp: 18   Temp: (!) 96.5 F (35.8 C)   TempSrc: Tympanic   Weight: 213 lb (96.6 kg)   Height: '5\' 2"'$  (1.575 m)    Physical Exam Cardiovascular:     Rate and Rhythm: Normal rate and regular rhythm.     Heart sounds: Normal heart sounds.  Pulmonary:     Effort: Pulmonary effort is normal.     Breath sounds: Normal breath sounds.  Abdominal:     General: Bowel sounds are normal.     Palpations: Abdomen is soft.  Skin:    General: Skin is warm and dry.  Neurological:     Mental Status: She is alert and oriented to person, place, and time.    Breast exam was performed in seated and lying down position. Patient is status post right lumpectomy with a well-healed surgical scar. No evidence of any palpable masses. No evidence of axillary adenopathy.  No evidence of any palpable masses or lumps in the left breast. No evidence of leftt axillary adenopathy      Latest Ref Rng & Units 01/23/2023   11:10 AM  CMP  Glucose 70 - 99 mg/dL 80   BUN 8 - 23 mg/dL 29   Creatinine 0.44 - 1.00 mg/dL 1.22   Sodium 135 - 145 mmol/L 140   Potassium 3.5 - 5.1 mmol/L 4.2   Chloride 98 - 111 mmol/L 105   CO2 22 - 32 mmol/L 28   Calcium 8.9 - 10.3 mg/dL 9.3   Total Protein 6.5 - 8.1 g/dL 7.0   Total Bilirubin 0.3 - 1.2 mg/dL 0.4   Alkaline Phos 38 - 126 U/L 88   AST 15 - 41 U/L 17   ALT 0 - 44 U/L 13       Latest Ref Rng & Units 01/23/2023   11:10 AM  CBC  WBC 4.0 - 10.5 K/uL 4.7   Hemoglobin 12.0 - 15.0 g/dL 8.5   Hematocrit 36.0 - 46.0 % 29.2   Platelets 150 - 400 K/uL 193      Assessment and plan- Patient is a 67 y.o. 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  She is s/p 6 cycles of neoadjuvant TCHP chemotherapy with residual 5 mm tumor.  She had persistent cytopenias and thrombocytopenia with Kadcyla and was switched to singgle agent maintenance Herceptin which she completed in February 2022.  Currently on letrozole and this is a routine follow-up visit  Clinically patient is doing Well with no concerning signs and symptoms of recurrence based  on today's exam.  She had a diagnostic bilateral mammogram and November 2023 which was unremarkable.  Given that she had high risk ER/PR positive HER2 positive breast cancer she is on letrozole which she started sometime in August 2021 and will continue for 10 years.  Patient was recommended adjuvant Zometa but could not tolerate due to side effects.  Normocytic anemia I suspect this is secondary to her recent knee surgery.  I will repeat CBC ferritin and iron studies as well as B12 levels in 3 months and see her in 6 months.  I will schedule her mammogram in November 2024.     Visit Diagnosis 1. Encounter for  follow-up surveillance of breast cancer   2. Normocytic anemia   3. Use of letrozole (Femara)      Dr. Randa Evens, MD, MPH [facility] [id number] 01/24/2023 9:10 PM

## 2023-02-06 ENCOUNTER — Telehealth: Payer: Self-pay | Admitting: *Deleted

## 2023-02-06 NOTE — Telephone Encounter (Signed)
Patient wants port removed and states surgeons office needs order faxed to them

## 2023-02-10 ENCOUNTER — Ambulatory Visit (INDEPENDENT_AMBULATORY_CARE_PROVIDER_SITE_OTHER): Payer: Medicare PPO | Admitting: Cardiovascular Disease

## 2023-02-10 ENCOUNTER — Encounter: Payer: Self-pay | Admitting: Cardiovascular Disease

## 2023-02-10 ENCOUNTER — Encounter: Payer: Self-pay | Admitting: Oncology

## 2023-02-10 VITALS — BP 160/78 | HR 78 | Temp 97.5°F | Ht 62.0 in | Wt 211.4 lb

## 2023-02-10 DIAGNOSIS — I10 Essential (primary) hypertension: Secondary | ICD-10-CM

## 2023-02-10 DIAGNOSIS — K219 Gastro-esophageal reflux disease without esophagitis: Secondary | ICD-10-CM

## 2023-02-10 DIAGNOSIS — E118 Type 2 diabetes mellitus with unspecified complications: Secondary | ICD-10-CM

## 2023-02-10 DIAGNOSIS — R0789 Other chest pain: Secondary | ICD-10-CM

## 2023-02-10 NOTE — Progress Notes (Signed)
Cardiology Office Note   Date:  02/10/2023   ID:  Christina Mcclain, DOB [birth year], MRN [id number]  PCP:  Perrin Maltese, MD  Cardiologist:  Neoma Laming, MD      History of Present Illness: Christina Mcclain is a 67 y.o. female who presents for  Chief Complaint  Patient presents with   Follow-up    2 month follow up    Gastroesophageal Reflux She complains of belching and chest pain. This is a recurrent problem. The current episode started more than 1 month ago. The problem has been gradually worsening.  Chest Pain  This is a recurrent problem. The current episode started yesterday. The onset quality is sudden. The problem has been gradually improving.      Past Medical History:  Diagnosis Date   Adverse effect of anesthesia    hard to awaken   Anemia    Arthritis    Asthma    Asthma without status asthmaticus    unspecified; Take advair each night   Breast cancer ([place]) 2021   right breast IMC   Bronchitis    Chronic kidney disease    stage III per dr Holley Raring   Constipation    Diabetes ([place]) 2010   Diabetes mellitus type 2, uncomplicated ([place])    Diabetes mellitus without complication ([place])    Encounter for blood transfusion    hysterectomy 9-10 years ago   Heart murmur    unspecified   Hyperlipidemia    Hypertension 1991   Hypothyroid    Multiple thyroid nodules    Neuromuscular disorder (HCC)    neuropathy   Osteoarthritis of right hip 02/04/2017   Palpitations    Pernicious anemia    Personal history of chemotherapy 2020-2021   Right breast ca   Personal history of radiation therapy 2021   right breast [facility]   Pneumonia    Sleep apnea    Use C-PAP; can't use mouth piece. Anxiety   Thyroid disease    nodules   Thyroid nodule    x2   Urinary urgency      Past Surgical History:  Procedure Laterality Date   ABDOMINAL HYSTERECTOMY  2011   partial   ABDOMINAL SURGERY     BREAST BIOPSY Right 10/02/2020   affirm bx, x marker, fat necrosis    BREAST BIOPSY Right 10/19/2019   two masses at 9:00 [place] bx, coil marker and venus marker, IMC for both   BREAST LUMPECTOMY Right 03/28/2020   bracketing of two IMC masses at 9:00.  Venus marker was pushed medially during wire loc. Remains in breast.  Clear surgical margins. Negative Lymph node   CARPAL TUNNEL RELEASE Left 2008   CARPAL TUNNEL RELEASE Right    CHOLECYSTECTOMY  1982   COLONOSCOPY WITH PROPOFOL N/A 10/05/2017   Procedure: COLONOSCOPY WITH PROPOFOL;  Surgeon: Manya Silvas, MD;  Location: [facility] ENDOSCOPY;  Service: Endoscopy;  Laterality: N/A;   COLONOSCOPY WITH PROPOFOL N/A 08/09/2021   Procedure: COLONOSCOPY WITH PROPOFOL;  Surgeon: Annamaria Helling, DO;  Location: [facility] ENDOSCOPY;  Service: Gastroenterology;  Laterality: N/A;   DIAGNOSTIC LAPAROSCOPY     ESOPHAGOGASTRODUODENOSCOPY (EGD) WITH PROPOFOL N/A 08/09/2021   Procedure: ESOPHAGOGASTRODUODENOSCOPY (EGD) WITH PROPOFOL;  Surgeon: Annamaria Helling, DO;  Location: [facility] ENDOSCOPY;  Service: Gastroenterology;  Laterality: N/A;   JOINT REPLACEMENT Right 02/09/2017   TOTAL HIP REPLACEMENT, DR. CARTER. [place]   KNEE ARTHROSCOPY  1990s   PARTIAL MASTECTOMY WITH NEEDLE LOCALIZATION AND AXILLARY SENTINEL LYMPH NODE  BX Right 03/28/2020   Procedure: PARTIAL MASTECTOMY WITH NEEDLE LOCALIZATION AND AXILLARY SENTINEL LYMPH NODE BX;  Surgeon: Herbert Pun, MD;  Location: [facility] ORS;  Service: General;  Laterality: Right;   PORTACATH PLACEMENT Left 11/07/2019   Procedure: INSERTION PORT-A-CATH;  Surgeon: Herbert Pun, MD;  Location: [facility] ORS;  Service: General;  Laterality: Left;   TOTAL HIP ARTHROPLASTY Left 06/23/2017   Procedure: TOTAL HIP ARTHROPLASTY ANTERIOR APPROACH;  Surgeon: Hessie Knows, MD;  Location: [facility] ORS;  Service: Orthopedics;  Laterality: Left;     Current Outpatient Medications  Medication Sig Dispense Refill   acetaminophen (TYLENOL) 500 MG tablet Take 500 mg by mouth every 6 (six) hours  as needed.     albuterol (PROVENTIL HFA;VENTOLIN HFA) 108 (90 Base) MCG/ACT inhaler Inhale 2 puffs into the lungs every 6 (six) hours as needed for wheezing or shortness of breath.     CALCIUM CITRATE PO Take 1,000 mg by mouth daily.     carvedilol (COREG) 25 MG tablet Take 25 mg by mouth 2 (two) times daily with a meal.      cholecalciferol (VITAMIN D) 1000 units tablet Take 2,000 Units by mouth daily.      ezetimibe (ZETIA) 10 MG tablet Take 10 mg by mouth daily.     Fluticasone-Salmeterol (ADVAIR) 250-50 MCG/DOSE AEPB Inhale 1 puff into the lungs 2 (two) times daily.      furosemide (LASIX) 20 MG tablet      letrozole (FEMARA) 2.5 MG tablet TAKE 1 TABLET(2.5 MG) BY MOUTH DAILY 90 tablet 2   lidocaine-prilocaine (EMLA) cream APPLY 1 APPLICATION TOPICALLY AS NEEDED 30 g 1   loratadine (CLARITIN) 10 MG tablet Take 10 mg by mouth daily as needed for allergies.      losartan (COZAAR) 25 MG tablet      magnesium oxide (MAG-OX) 400 MG tablet Take 2 tablets by mouth daily.     pregabalin (LYRICA) 25 MG capsule Take 25 mg by mouth 2 (two) times daily.     solifenacin (VESICARE) 5 MG tablet Take 10 mg by mouth every evening.      traMADol (ULTRAM) 50 MG tablet Take 1 tablet (50 mg total) by mouth every 6 (six) hours as needed. 30 tablet 0   vitamin B-12 (CYANOCOBALAMIN) 1000 MCG tablet Take 1,000 mcg by mouth daily.     Calcium Carbonate-Vitamin D (CALCIUM 600+D PO) Take by mouth. (Patient not taking: Reported on 02/10/2023)     carvedilol (COREG) 25 MG tablet Take 1 tablet by mouth 2 (two) times daily. (Patient not taking: Reported on 02/10/2023)     methocarbamol (ROBAXIN) 500 MG tablet Take 1 tablet by mouth every 8 (eight) hours. (Patient not taking: Reported on 02/10/2023)     nystatin (MYCOSTATIN/NYSTOP) powder Apply 1 application topically 3 (three) times daily. (Patient not taking: Reported on 01/23/2023) 60 g 2   No current facility-administered medications for this visit.    Facility-Administered Medications Ordered in Other Visits  Medication Dose Route Frequency Provider Last Rate Last Admin   sodium chloride flush (NS) 0.9 % injection 10 mL  10 mL Intravenous PRN Sindy Guadeloupe, MD   10 mL at 01/05/20 1344   sodium chloride flush (NS) 0.9 % injection 10 mL  10 mL Intravenous PRN Sindy Guadeloupe, MD   10 mL at 05/17/20 0858    Allergies:   Amlodipine and Statins    Social History:   reports that she has never smoked. She has never used smokeless tobacco.  She reports that she does not drink alcohol and does not use drugs.   Family History:  family history includes Arrhythmia in her mother; Coronary artery disease in her maternal uncle; Diabetes in her brother, mother, and sister; Heart attack in her maternal uncle and mother; Heart disease in her brother and mother; Hypertension in her father and mother; Parkinson's disease in her father.    ROS:     Review of Systems  Constitutional: Negative.   HENT: Negative.    Eyes: Negative.   Respiratory: Negative.    Cardiovascular:  Positive for chest pain.  Genitourinary: Negative.   Musculoskeletal: Negative.   Skin: Negative.   Neurological: Negative.   Endo/Heme/Allergies: Negative.   Psychiatric/Behavioral: Negative.    All other systems reviewed and are negative.     All other systems are reviewed and negative.    PHYSICAL EXAM: VS:  BP (!) 160/78   Pulse 78   Temp (!) 97.5 F (36.4 C) (Tympanic)   Ht 5\' 2"  (1.575 m)   Wt 211 lb 6.4 oz (95.9 kg)   SpO2 94%   BMI 38.67 kg/m  , BMI Body mass index is 38.67 kg/m. Last weight:  Wt Readings from Last 3 Encounters:  02/10/23 211 lb 6.4 oz (95.9 kg)  01/23/23 213 lb (96.6 kg)  07/23/22 208 lb (94.3 kg)     Physical Exam Constitutional:      Appearance: Normal appearance.  Cardiovascular:     Rate and Rhythm: Normal rate and regular rhythm.     Heart sounds: Normal heart sounds.  Pulmonary:     Effort: Pulmonary effort is normal.      Breath sounds: Normal breath sounds.  Musculoskeletal:     Right lower leg: No edema.     Left lower leg: No edema.  Neurological:     Mental Status: She is alert.       EKG:   Recent Labs: 01/23/2023: ALT 13; BUN 29; Creatinine, Ser 1.22; Hemoglobin 8.5; Platelets 193; Potassium 4.2; Sodium 140    Lipid Panel No results found for: "CHOL", "TRIG", "HDL", "CHOLHDL", "VLDL", "LDLCALC", "LDLDIRECT"    REASON FOR VISIT  Visit for: Echocardiogram/I34.0  Sex:   Female   wt= 205  lbs.  BP=118/64  Height=62    inches.        TESTS  Imaging: Echocardiogram:  An echocardiogram in (2-d) mode was performed and in Doppler mode with color flow velocity mapping was performed. The aortic valve cusps are abnormal 1.1   cm, flow velocity 0000000  m/s, and systolic calculated mean flow gradient 4   mmHg. Mitral valve diastolic peak flow velocity E 1.16   m/s and E/A ratio 1.5. Aortic root diameter 2.4   cm. The LVOT internal diameter 2.6  cm and flow velocity was abnormal .767   m/s. LV systolic dimension [id number]  cm, diastolic 123456  cm, posterior wall thickness 1.29  cm, fractional shortening 53.2  %, and EF 84.3 %. IVS thickness 0.935   cm. LA dimension 4.8 cm. Mitral Valve has Mild to Moderate Regurgitation. Tricuspid Valve has Trace Regurgitation.     ASSESSMENT  Technically adequate study.  Normal left ventricular systolic function.  Mild left ventricular hypertrophy with GRADE 2 (psuedonormalization) diastolic dysfunction.  Normal right ventricular systolic function.  Normal right ventricular diastolic function.  Normal left ventricular wall motion.  Normal right ventricular wall motion.  Trace tricuspid regurgitation.  Normal pulmonary artery pressure.  Mild to Moderate mitral regurgitation.  No  pericardial effusion.  Mildly dilated Left and right atrium   Severe LVH.     THERAPY   Referring physician: Dionisio David  Sonographer: Neoma Laming.      Neoma Laming MD   Electronically signed by: Neoma Laming     Date: 10/02/2022 14:15 Other studies Reviewed: Additional studies/ records that were reviewed today include:  Review of the above records demonstrates:       No data to display            ASSESSMENT AND PLAN:    ICD-10-CM   1. Controlled type 2 diabetes mellitus with complication, without long-term current use of insulin (HCC)  E11.8 MYOCARDIAL PERFUSION IMAGING    2. Gastro-esophageal reflux disease without esophagitis  K21.9 MYOCARDIAL PERFUSION IMAGING    3. Essential (primary) hypertension  I10 MYOCARDIAL PERFUSION IMAGING    4. Other chest pain  R07.89 MYOCARDIAL PERFUSION IMAGING   take protonix bid, and do stress test in the mean time       Problem List Items Addressed This Visit       Cardiovascular and Mediastinum   Essential (primary) hypertension   Relevant Medications   furosemide (LASIX) 20 MG tablet   Other Relevant Orders   MYOCARDIAL PERFUSION IMAGING     Digestive   Gastro-esophageal reflux disease without esophagitis   Relevant Orders   MYOCARDIAL PERFUSION IMAGING     Endocrine   Controlled type 2 diabetes mellitus with complication, without long-term current use of insulin ([place]) - Primary   Relevant Orders   MYOCARDIAL PERFUSION IMAGING     Other   Chest pain, unspecified   Relevant Orders   MYOCARDIAL PERFUSION IMAGING       Disposition:   Return in about 4 weeks (around 03/10/2023) for stress test and f/u.    Total time spent: 30 minutes  Signed,  Neoma Laming, MD  02/10/2023 9:51 AM    [place]

## 2023-02-11 ENCOUNTER — Telehealth: Payer: Self-pay

## 2023-02-11 NOTE — Telephone Encounter (Signed)
Pt called and left vm asking if we can send orders to [facility] for her to get a new CPAP machine, just wanted to make sure it was okay with you first. Please advise

## 2023-02-12 ENCOUNTER — Encounter: Payer: Self-pay | Admitting: Oncology

## 2023-02-13 ENCOUNTER — Telehealth: Payer: Self-pay | Admitting: *Deleted

## 2023-02-13 NOTE — Telephone Encounter (Signed)
I was on vacation and the staff here put in order for IR to get port out and it should be with Dr. Peyton Najjar. I have sent a order faxed and it went through

## 2023-02-18 ENCOUNTER — Ambulatory Visit (INDEPENDENT_AMBULATORY_CARE_PROVIDER_SITE_OTHER): Payer: Medicare PPO

## 2023-02-18 DIAGNOSIS — K219 Gastro-esophageal reflux disease without esophagitis: Secondary | ICD-10-CM

## 2023-02-18 DIAGNOSIS — I10 Essential (primary) hypertension: Secondary | ICD-10-CM

## 2023-02-18 DIAGNOSIS — R0789 Other chest pain: Secondary | ICD-10-CM | POA: Diagnosis not present

## 2023-02-18 DIAGNOSIS — E118 Type 2 diabetes mellitus with unspecified complications: Secondary | ICD-10-CM

## 2023-02-18 MED ORDER — TECHNETIUM TC 99M SESTAMIBI GENERIC - CARDIOLITE
32.0000 | Freq: Once | INTRAVENOUS | Status: AC | PRN
  Administered 2023-02-18: 32 via INTRAVENOUS

## 2023-02-18 MED ORDER — TECHNETIUM TC 99M SESTAMIBI GENERIC - CARDIOLITE
9.5000 | Freq: Once | INTRAVENOUS | Status: AC | PRN
  Administered 2023-02-18: 9.5 via INTRAVENOUS

## 2023-02-20 ENCOUNTER — Encounter: Payer: Self-pay | Admitting: *Deleted

## 2023-02-23 DIAGNOSIS — M25661 Stiffness of right knee, not elsewhere classified: Secondary | ICD-10-CM | POA: Diagnosis not present

## 2023-02-23 DIAGNOSIS — M25561 Pain in right knee: Secondary | ICD-10-CM | POA: Diagnosis not present

## 2023-02-25 DIAGNOSIS — M25661 Stiffness of right knee, not elsewhere classified: Secondary | ICD-10-CM | POA: Diagnosis not present

## 2023-02-25 DIAGNOSIS — M25561 Pain in right knee: Secondary | ICD-10-CM | POA: Diagnosis not present

## 2023-03-02 DIAGNOSIS — M25561 Pain in right knee: Secondary | ICD-10-CM | POA: Diagnosis not present

## 2023-03-02 DIAGNOSIS — M25661 Stiffness of right knee, not elsewhere classified: Secondary | ICD-10-CM | POA: Diagnosis not present

## 2023-03-04 DIAGNOSIS — M25561 Pain in right knee: Secondary | ICD-10-CM | POA: Diagnosis not present

## 2023-03-04 DIAGNOSIS — M25661 Stiffness of right knee, not elsewhere classified: Secondary | ICD-10-CM | POA: Diagnosis not present

## 2023-03-09 DIAGNOSIS — M25661 Stiffness of right knee, not elsewhere classified: Secondary | ICD-10-CM | POA: Diagnosis not present

## 2023-03-09 DIAGNOSIS — M25561 Pain in right knee: Secondary | ICD-10-CM | POA: Diagnosis not present

## 2023-03-10 ENCOUNTER — Ambulatory Visit (INDEPENDENT_AMBULATORY_CARE_PROVIDER_SITE_OTHER): Payer: Medicare PPO | Admitting: Cardiovascular Disease

## 2023-03-10 ENCOUNTER — Encounter: Payer: Self-pay | Admitting: Cardiovascular Disease

## 2023-03-10 VITALS — BP 162/96 | HR 61 | Ht 62.0 in | Wt 212.8 lb

## 2023-03-10 DIAGNOSIS — R079 Chest pain, unspecified: Secondary | ICD-10-CM

## 2023-03-10 DIAGNOSIS — E118 Type 2 diabetes mellitus with unspecified complications: Secondary | ICD-10-CM | POA: Diagnosis not present

## 2023-03-10 DIAGNOSIS — I10 Essential (primary) hypertension: Secondary | ICD-10-CM | POA: Diagnosis not present

## 2023-03-10 DIAGNOSIS — E7800 Pure hypercholesterolemia, unspecified: Secondary | ICD-10-CM | POA: Diagnosis not present

## 2023-03-10 DIAGNOSIS — I2081 Angina pectoris with coronary microvascular dysfunction: Secondary | ICD-10-CM | POA: Diagnosis not present

## 2023-03-10 DIAGNOSIS — G4733 Obstructive sleep apnea (adult) (pediatric): Secondary | ICD-10-CM | POA: Diagnosis not present

## 2023-03-10 MED ORDER — HYDRALAZINE HCL 25 MG PO TABS: 25.0000 mg | ORAL_TABLET | Freq: Two times a day (BID) | ORAL | 3 refills | Status: DC

## 2023-03-10 NOTE — Assessment & Plan Note (Signed)
Add hydralazine 25 bid as repeat BP 170/80.

## 2023-03-10 NOTE — Assessment & Plan Note (Signed)
Creat 1.22 in past but had chest pain while in office, thus will do ccta as stress test showed ischaemia in LAD territory with normal LVEF

## 2023-03-10 NOTE — Patient Instructions (Signed)
Take 1 tab carvedelol 25 mg night before and 1 tab 90 minutes prior to ccta

## 2023-03-10 NOTE — Progress Notes (Signed)
Cardiology Office Note   Date:  03/10/2023   ID:  Christina Mcclain, DOB [birth year], MRN [id number]  PCP:  Margaretann Loveless, MD  Cardiologist:  Adrian Blackwater, MD      History of Present Illness: Christina Mcclain is a 67 y.o. female who presents for  Chief Complaint  Patient presents with   Follow-up    Follow up    . Had chest pain in office. BP is up but did not take meds yet.      Past Medical History:  Diagnosis Date   Adverse effect of anesthesia    hard to awaken   Anemia    Arthritis    Asthma    Asthma without status asthmaticus    unspecified; Take advair each night   Breast cancer 2021   right breast IMC   Bronchitis    Chronic kidney disease    stage III per dr Cherylann Ratel   Constipation    Diabetes 2010   Diabetes mellitus type 2, uncomplicated    Diabetes mellitus without complication    Encounter for blood transfusion    hysterectomy 9-10 years ago   Heart murmur    unspecified   Hyperlipidemia    Hypertension 1991   Hypothyroid    Multiple thyroid nodules    Neuromuscular disorder    neuropathy   Osteoarthritis of right hip 02/04/2017   Palpitations    Pernicious anemia    Personal history of chemotherapy 2020-2021   Right breast ca   Personal history of radiation therapy 2021   right breast [facility]   Pneumonia    Sleep apnea    Use C-PAP; can't use mouth piece. Anxiety   Thyroid disease    nodules   Thyroid nodule    x2   Urinary urgency      Past Surgical History:  Procedure Laterality Date   ABDOMINAL HYSTERECTOMY  2011   partial   ABDOMINAL SURGERY     BREAST BIOPSY Right 10/02/2020   affirm bx, x marker, fat necrosis   BREAST BIOPSY Right 10/19/2019   two masses at 9:00 [place] bx, coil marker and venus marker, IMC for both   BREAST LUMPECTOMY Right 03/28/2020   bracketing of two IMC masses at 9:00.  Venus marker was pushed medially during wire loc. Remains in breast.  Clear surgical margins. Negative Lymph node   CARPAL TUNNEL  RELEASE Left 2008   CARPAL TUNNEL RELEASE Right    CHOLECYSTECTOMY  1982   COLONOSCOPY WITH PROPOFOL N/A 10/05/2017   Procedure: COLONOSCOPY WITH PROPOFOL;  Surgeon: Scot Jun, MD;  Location[facility] ENDOSCOPY;  Service: Endoscopy;  Laterality: N/A;   COLONOSCOPY WITH PROPOFOL N/A 08/09/2021   Procedure: COLONOSCOPY WITH PROPOFOL;  Surgeon: Jaynie Collins, DO;  Location: [facility] ENDOSCOPY;  Service: Gastroenterology;  Laterality: N/A;   DIAGNOSTIC LAPAROSCOPY     ESOPHAGOGASTRODUODENOSCOPY (EGD) WITH PROPOFOL N/A 08/09/2021   Procedure: ESOPHAGOGASTRODUODENOSCOPY (EGD) WITH PROPOFOL;  Surgeon: Jaynie Collins, DO;  Location: [facility] ENDOSCOPY;  Service: Gastroenterology;  Laterality: N/A;   JOINT REPLACEMENT Right 02/09/2017   TOTAL HIP REPLACEMENT, DR. CARTER. [place]   KNEE ARTHROSCOPY  1990s   PARTIAL MASTECTOMY WITH NEEDLE LOCALIZATION AND AXILLARY SENTINEL LYMPH NODE BX Right 03/28/2020   Procedure: PARTIAL MASTECTOMY WITH NEEDLE LOCALIZATION AND AXILLARY SENTINEL LYMPH NODE BX;  Surgeon: Carolan Shiver, MD;  Location: [facility] ORS;  Service: General;  Laterality: Right;   PORTACATH PLACEMENT Left 11/07/2019   Procedure: INSERTION PORT-A-CATH;  Surgeon: Hazle Quant,  Lurline Idol, MD;  Location: [facility] ORS;  Service: General;  Laterality: Left;   TOTAL HIP ARTHROPLASTY Left 06/23/2017   Procedure: TOTAL HIP ARTHROPLASTY ANTERIOR APPROACH;  Surgeon: Kennedy Bucker, MD;  Location: [facility] ORS;  Service: Orthopedics;  Laterality: Left;     Current Outpatient Medications  Medication Sig Dispense Refill   acetaminophen (TYLENOL) 500 MG tablet Take 500 mg by mouth every 6 (six) hours as needed.     albuterol (PROVENTIL HFA;VENTOLIN HFA) 108 (90 Base) MCG/ACT inhaler Inhale 2 puffs into the lungs every 6 (six) hours as needed for wheezing or shortness of breath.     CALCIUM CITRATE PO Take 1,000 mg by mouth daily.     carvedilol (COREG) 25 MG tablet Take 25 mg by mouth 2 (two) times daily  with a meal.      cholecalciferol (VITAMIN D) 1000 units tablet Take 2,000 Units by mouth daily.      ezetimibe (ZETIA) 10 MG tablet Take 10 mg by mouth daily.     Fluticasone-Salmeterol (ADVAIR) 250-50 MCG/DOSE AEPB Inhale 1 puff into the lungs 2 (two) times daily.      furosemide (LASIX) 20 MG tablet      hydrALAZINE (APRESOLINE) 25 MG tablet Take 1 tablet (25 mg total) by mouth 2 times daily at 12 noon and 4 pm. 180 tablet 3   letrozole (FEMARA) 2.5 MG tablet TAKE 1 TABLET(2.5 MG) BY MOUTH DAILY 90 tablet 2   lidocaine-prilocaine (EMLA) cream APPLY 1 APPLICATION TOPICALLY AS NEEDED 30 g 1   loratadine (CLARITIN) 10 MG tablet Take 10 mg by mouth daily as needed for allergies.      losartan (COZAAR) 25 MG tablet      magnesium oxide (MAG-OX) 400 MG tablet Take 2 tablets by mouth daily.     meloxicam (MOBIC) 15 MG tablet Take 15 mg by mouth daily.     pregabalin (LYRICA) 25 MG capsule Take 25 mg by mouth 2 (two) times daily.     solifenacin (VESICARE) 5 MG tablet Take 10 mg by mouth every evening.      traMADol (ULTRAM) 50 MG tablet Take 1 tablet (50 mg total) by mouth every 6 (six) hours as needed. 30 tablet 0   Calcium Carbonate-Vitamin D (CALCIUM 600+D PO) Take by mouth. (Patient not taking: Reported on 03/10/2023)     carvedilol (COREG) 25 MG tablet Take 1 tablet by mouth 2 (two) times daily. (Patient not taking: Reported on 02/10/2023)     methocarbamol (ROBAXIN) 500 MG tablet Take 1 tablet by mouth every 8 (eight) hours. (Patient not taking: Reported on 02/10/2023)     nystatin (MYCOSTATIN/NYSTOP) powder Apply 1 application topically 3 (three) times daily. (Patient not taking: Reported on 01/23/2023) 60 g 2   vitamin B-12 (CYANOCOBALAMIN) 1000 MCG tablet Take 1,000 mcg by mouth daily. (Patient not taking: Reported on 03/10/2023)     No current facility-administered medications for this visit.   Facility-Administered Medications Ordered in Other Visits  Medication Dose Route Frequency Provider  Last Rate Last Admin   sodium chloride flush (NS) 0.9 % injection 10 mL  10 mL Intravenous PRN Creig Hines, MD   10 mL at 01/05/20 1344   sodium chloride flush (NS) 0.9 % injection 10 mL  10 mL Intravenous PRN Creig Hines, MD   10 mL at 05/17/20 0858    Allergies:   Amlodipine and Statins    Social History:   reports that she has never smoked. She has never used  smokeless tobacco. She reports that she does not drink alcohol and does not use drugs.   Family History:  family history includes Arrhythmia in her mother; Coronary artery disease in her maternal uncle; Diabetes in her brother, mother, and sister; Heart attack in her maternal uncle and mother; Heart disease in her brother and mother; Hypertension in her father and mother; Parkinson's disease in her father.    ROS:     Review of Systems  Constitutional: Negative.   HENT: Negative.    Eyes: Negative.   Respiratory: Negative.    Gastrointestinal: Negative.   Genitourinary: Negative.   Musculoskeletal: Negative.   Skin: Negative.   Neurological: Negative.   Endo/Heme/Allergies: Negative.   Psychiatric/Behavioral: Negative.    All other systems reviewed and are negative.     All other systems are reviewed and negative.    PHYSICAL EXAM: VS:  BP (!) 162/96   Pulse 61   Ht  (1.575 m)   Wt 212 lb 12.8 oz (96.5 kg)   SpO2 97%   BMI 38.92 kg/m  , BMI Body mass index is 38.92 kg/m. Last weight:  Wt Readings from Last 3 Encounters:  03/10/23 212 lb 12.8 oz (96.5 kg)  02/10/23 211 lb 6.4 oz (95.9 kg)  01/23/23 213 lb (96.6 kg)     Physical Exam Constitutional:      Appearance: Normal appearance.  Cardiovascular:     Rate and Rhythm: Normal rate and regular rhythm.     Heart sounds: Normal heart sounds.  Pulmonary:     Effort: Pulmonary effort is normal.     Breath sounds: Normal breath sounds.  Musculoskeletal:     Right lower leg: No edema.     Left lower leg: No edema.  Neurological:     Mental  Status: She is alert.       EKG:   Recent Labs: 01/23/2023: ALT 13; BUN 29; Creatinine, Ser 1.22; Hemoglobin 8.5; Platelets 193; Potassium 4.2; Sodium 140    Lipid Panel No results found for: "CHOL", "TRIG", "HDL", "CHOLHDL", "VLDL", "LDLCALC", "LDLDIRECT"    Other studies Reviewed: Additional studies/ records that were reviewed today include:  Review of the above records demonstrates:       No data to display            ASSESSMENT AND PLAN:    ICD-10-CM   1. Essential (primary) hypertension  I10 Basic metabolic panel    CT CORONARY MORPH W/CTA COR W/SCORE W/CA W/CM &/OR WO/CM    2. Obstructive sleep apnea syndrome  G47.33 Basic metabolic panel    CT CORONARY MORPH W/CTA COR W/SCORE W/CA W/CM &/OR WO/CM    3. Controlled type 2 diabetes mellitus with complication, without long-term current use of insulin  E11.8 Basic metabolic panel    CT CORONARY MORPH W/CTA COR W/SCORE W/CA W/CM &/OR WO/CM    4. Chest pain, unspecified type  R07.9 Basic metabolic panel    CT CORONARY MORPH W/CTA COR W/SCORE W/CA W/CM &/OR WO/CM    5. High cholesterol  E78.00 Basic metabolic panel    CT CORONARY MORPH W/CTA COR W/SCORE W/CA W/CM &/OR WO/CM    6. Angina pectoris with coronary microvascular dysfunction  I20.81 CT CORONARY MORPH W/CTA COR W/SCORE W/CA W/CM &/OR WO/CM       Problem List Items Addressed This Visit       Cardiovascular and Mediastinum   Essential (primary) hypertension - Primary    Add hydralazine 25 bid as repeat BP  170/80.      Relevant Medications   hydrALAZINE (APRESOLINE) 25 MG tablet   Other Relevant Orders   Basic metabolic panel   CT CORONARY MORPH W/CTA COR W/SCORE W/CA W/CM &/OR WO/CM     Respiratory   Obstructive sleep apnea syndrome   Relevant Orders   Basic metabolic panel   CT CORONARY MORPH W/CTA COR W/SCORE W/CA W/CM &/OR WO/CM     Endocrine   Controlled type 2 diabetes mellitus with complication, without long-term current use of insulin    Relevant Orders   Basic metabolic panel   CT CORONARY MORPH W/CTA COR W/SCORE W/CA W/CM &/OR WO/CM     Other   Chest pain, unspecified    Creat 1.22 in past but had chest pain while in office, thus will do ccta as stress test showed ischaemia in LAD territory with normal LVEF      Relevant Orders   Basic metabolic panel   CT CORONARY MORPH W/CTA COR W/SCORE W/CA W/CM &/OR WO/CM   CT CORONARY MORPH W/CTA COR W/SCORE W/CA W/CM &/OR WO/CM   High cholesterol   Relevant Medications   hydrALAZINE (APRESOLINE) 25 MG tablet   Other Relevant Orders   Basic metabolic panel   CT CORONARY MORPH W/CTA COR W/SCORE W/CA W/CM &/OR WO/CM       Disposition:   Return in about 2 weeks (around 03/24/2023) for after ccta.    Total time spent: 45 minutes  Signed,  Adrian Blackwater, MD  03/10/2023 10:23 AM    [facility]

## 2023-03-11 ENCOUNTER — Telehealth: Payer: Self-pay

## 2023-03-11 DIAGNOSIS — M25661 Stiffness of right knee, not elsewhere classified: Secondary | ICD-10-CM | POA: Diagnosis not present

## 2023-03-11 DIAGNOSIS — M25561 Pain in right knee: Secondary | ICD-10-CM | POA: Diagnosis not present

## 2023-03-11 LAB — BASIC METABOLIC PANEL
BUN/Creatinine Ratio: 19 (ref 12–28)
BUN: 27 mg/dL (ref 8–27)
CO2: 27 mmol/L (ref 20–29)
Calcium: 9.9 mg/dL (ref 8.7–10.3)
Chloride: 103 mmol/L (ref 96–106)
Creatinine, Ser: 1.45 mg/dL — ABNORMAL HIGH (ref 0.57–1.00)
Glucose: 85 mg/dL (ref 70–99)
Potassium: 4.6 mmol/L (ref 3.5–5.2)
Sodium: 144 mmol/L (ref 134–144)
eGFR: 39 mL/min/{1.73_m2} — ABNORMAL LOW (ref >59)

## 2023-03-11 NOTE — Telephone Encounter (Signed)
Patient called stating that she was supposed to get a  BP medication yesterday but hasn't gotten it yet

## 2023-03-12 MED ORDER — HYDRALAZINE HCL 100 MG PO TABS: 100.0000 mg | ORAL_TABLET | Freq: Four times a day (QID) | ORAL | 11 refills | Status: DC

## 2023-03-12 NOTE — Addendum Note (Signed)
Addended by: Adrian Blackwater A on: 03/12/2023 01:57 PM   Modules accepted: Orders

## 2023-03-13 ENCOUNTER — Other Ambulatory Visit: Payer: Self-pay

## 2023-03-13 MED ORDER — HYDRALAZINE HCL 25 MG PO TABS: 25.0000 mg | ORAL_TABLET | Freq: Two times a day (BID) | ORAL | 3 refills | Status: AC

## 2023-03-13 NOTE — Telephone Encounter (Signed)
Sent to patient pharmacy

## 2023-03-16 ENCOUNTER — Encounter: Payer: Self-pay | Admitting: Internal Medicine

## 2023-03-16 DIAGNOSIS — M25661 Stiffness of right knee, not elsewhere classified: Secondary | ICD-10-CM | POA: Diagnosis not present

## 2023-03-16 DIAGNOSIS — M25561 Pain in right knee: Secondary | ICD-10-CM | POA: Diagnosis not present

## 2023-03-17 ENCOUNTER — Encounter: Payer: Self-pay | Admitting: Internal Medicine

## 2023-03-17 ENCOUNTER — Ambulatory Visit (INDEPENDENT_AMBULATORY_CARE_PROVIDER_SITE_OTHER): Payer: Medicare PPO | Admitting: Internal Medicine

## 2023-03-17 VITALS — BP 128/78 | HR 67 | Ht 62.0 in | Wt 212.0 lb

## 2023-03-17 DIAGNOSIS — R21 Rash and other nonspecific skin eruption: Secondary | ICD-10-CM

## 2023-03-17 DIAGNOSIS — G4733 Obstructive sleep apnea (adult) (pediatric): Secondary | ICD-10-CM

## 2023-03-17 DIAGNOSIS — E118 Type 2 diabetes mellitus with unspecified complications: Secondary | ICD-10-CM

## 2023-03-17 DIAGNOSIS — E782 Mixed hyperlipidemia: Secondary | ICD-10-CM | POA: Diagnosis not present

## 2023-03-17 DIAGNOSIS — D649 Anemia, unspecified: Secondary | ICD-10-CM | POA: Insufficient documentation

## 2023-03-17 DIAGNOSIS — D508 Other iron deficiency anemias: Secondary | ICD-10-CM | POA: Diagnosis not present

## 2023-03-17 DIAGNOSIS — I10 Essential (primary) hypertension: Secondary | ICD-10-CM

## 2023-03-17 NOTE — Progress Notes (Signed)
Established Patient Office Visit  Subjective:  Patient ID: Christina Mcclain, female    DOB: 1956-04-26  Age: 67 y.o. MRN: [id number]  Chief Complaint  Patient presents with   Follow-up    3 month follow up    Patient comes in for her follow-up.  She has been recovering from her total knee replacement surgery.  And is doing much better with physical therapy.  She has some concerns about a rash on her face.  Requesting dermatology referral. Patient is also having some problems with her CPAP machine has reached out to her DME company.  Still waiting to hear from them.  She will get her fasting blood work in a couple of weeks.  No other complaints at this time.    No other concerns at this time.   Past Medical History:  Diagnosis Date   Adverse effect of anesthesia    hard to awaken   Anemia    Arthritis    Asthma    Asthma without status asthmaticus    unspecified; Take advair each night   Breast cancer 2021   right breast IMC   Bronchitis    Chronic kidney disease    stage III per dr Cherylann Ratel   Constipation    Diabetes 2010   Diabetes mellitus type 2, uncomplicated    Diabetes mellitus without complication    Encounter for blood transfusion    hysterectomy 9-10 years ago   Heart murmur    unspecified   Hyperlipidemia    Hypertension 1991   Hypothyroid    Multiple thyroid nodules    Neuromuscular disorder    neuropathy   Osteoarthritis of right hip 02/04/2017   Palpitations    Pernicious anemia    Personal history of chemotherapy 2020-2021   Right breast ca   Personal history of radiation therapy 2021   right breast [facility]   Pneumonia    Sleep apnea    Use C-PAP; can't use mouth piece. Anxiety   Thyroid disease    nodules   Thyroid nodule    x2   Urinary urgency     Past Surgical History:  Procedure Laterality Date   ABDOMINAL HYSTERECTOMY  2011   partial   ABDOMINAL SURGERY     BREAST BIOPSY Right 10/02/2020   affirm bx, x marker, fat necrosis    BREAST BIOPSY Right 10/19/2019   two masses at 9:00 [place] bx, coil marker and venus marker, IMC for both   BREAST LUMPECTOMY Right 03/28/2020   bracketing of two IMC masses at 9:00.  Venus marker was pushed medially during wire loc. Remains in breast.  Clear surgical margins. Negative Lymph node   CARPAL TUNNEL RELEASE Left 2008   CARPAL TUNNEL RELEASE Right    CHOLECYSTECTOMY  1982   COLONOSCOPY WITH PROPOFOL N/A 10/05/2017   Procedure: COLONOSCOPY WITH PROPOFOL;  Surgeon: Scot Jun, MD;  Location: [facility] ENDOSCOPY;  Service: Endoscopy;  Laterality: N/A;   COLONOSCOPY WITH PROPOFOL N/A 08/09/2021   Procedure: COLONOSCOPY WITH PROPOFOL;  Surgeon: Jaynie Collins, DO;  Location[facility] ENDOSCOPY;  Service: Gastroenterology;  Laterality: N/A;   DIAGNOSTIC LAPAROSCOPY     ESOPHAGOGASTRODUODENOSCOPY (EGD) WITH PROPOFOL N/A 08/09/2021   Procedure: ESOPHAGOGASTRODUODENOSCOPY (EGD) WITH PROPOFOL;  Surgeon: Jaynie Collins, DO;  Location: [facility] ENDOSCOPY;  Service: Gastroenterology;  Laterality: N/A;   JOINT REPLACEMENT Right 02/09/2017   TOTAL HIP REPLACEMENT, DR. CARTER. [place]   KNEE ARTHROSCOPY  1990s   PARTIAL MASTECTOMY WITH NEEDLE LOCALIZATION AND AXILLARY  SENTINEL LYMPH NODE BX Right 03/28/2020   Procedure: PARTIAL MASTECTOMY WITH NEEDLE LOCALIZATION AND AXILLARY SENTINEL LYMPH NODE BX;  Surgeon: Carolan Shiver, MD;  Location: [facility] ORS;  Service: General;  Laterality: Right;   PORTACATH PLACEMENT Left 11/07/2019   Procedure: INSERTION PORT-A-CATH;  Surgeon: Carolan Shiver, MD;  Location: [facility] ORS;  Service: General;  Laterality: Left;   TOTAL HIP ARTHROPLASTY Left 06/23/2017   Procedure: TOTAL HIP ARTHROPLASTY ANTERIOR APPROACH;  Surgeon: Kennedy Bucker, MD;  Location: [facility] ORS;  Service: Orthopedics;  Laterality: Left;    Social History   Socioeconomic History   Marital status: Married    Spouse name: Not on file   Number of children: 2   Years of education: some  college   Highest education level: Not on file  Occupational History   Occupation: retired    Comment: [employer]  Tobacco Use   Smoking status: Never   Smokeless tobacco: Never  Building services engineer Use: Never used  Substance and Sexual Activity   Alcohol use: No   Drug use: No   Sexual activity: Not Currently  Other Topics Concern   Not on file  Social History Narrative   Not on file   Social Determinants of Health   Financial Resource Strain: Not on file  Food Insecurity: Not on file  Transportation Needs: Not on file  Physical Activity: Not on file  Stress: Not on file  Social Connections: Not on file  Intimate Partner Violence: Not on file    Family History  Problem Relation Age of Onset   Heart disease Mother    Hypertension Mother    Arrhythmia Mother    Heart attack Mother    Diabetes Mother    Hypertension Father    Parkinson's disease Father    Coronary artery disease Maternal Uncle    Heart attack Maternal Uncle    Diabetes Sister    Diabetes Brother    Heart disease Brother    Breast cancer Neg Hx     Allergies  Allergen Reactions   Amlodipine Other (See Comments)    edema   Statins Rash    Other reaction(s): Muscle Pain Possible hives also    Review of Systems  Constitutional:  Negative for chills, fever, malaise/fatigue and weight loss.  HENT:  Negative for congestion, hearing loss, sinus pain and sore throat.   Eyes: Negative.   Respiratory:  Positive for shortness of breath. Negative for cough and wheezing.   Cardiovascular:  Negative for chest pain and palpitations.  Gastrointestinal: Negative.   Genitourinary: Negative.   Musculoskeletal:  Positive for joint pain. Negative for back pain, myalgias and neck pain.  Skin:  Positive for rash. Negative for itching.  Neurological: Negative.   Psychiatric/Behavioral:  Negative for depression. The patient is not nervous/anxious.        Objective:   BP 128/78   Pulse  67   Ht  (1.575 m)   Wt 212 lb (96.2 kg)   SpO2 95%   BMI 38.78 kg/m   Vitals:   03/17/23 1007  BP: 128/78  Pulse: 67  Height:  (1.575 m)  Weight: 212 lb (96.2 kg)  SpO2: 95%  BMI (Calculated): 38.77    Physical Exam Vitals and nursing note reviewed.  Cardiovascular:     Rate and Rhythm: Normal rate and regular rhythm.     Pulses: Normal pulses.     Heart sounds: Normal heart sounds.  Pulmonary:  Effort: Pulmonary effort is normal.     Breath sounds: Normal breath sounds.  Abdominal:     General: Bowel sounds are normal.     Palpations: Abdomen is soft.  Musculoskeletal:        General: Normal range of motion.     Cervical back: Normal range of motion.     Right lower leg: No edema.     Left lower leg: No edema.  Neurological:     General: No focal deficit present.     Mental Status: She is alert and oriented to person, place, and time.  Psychiatric:        Mood and Affect: Mood normal.        Behavior: Behavior normal.      No results found for any visits on 03/17/23.      Assessment & Plan:  Patient advised to continue all her medications. She will return fasting for blood work.  Set up dermatology consult for her rash on the face. Problem List Items Addressed This Visit     Controlled type 2 diabetes mellitus with complication, without long-term current use of insulin - Primary   Relevant Orders   Hemoglobin A1c   High cholesterol   Essential hypertension, benign   Obstructive sleep apnea syndrome   Rash of face   Relevant Orders   Ambulatory referral to Dermatology   Absolute anemia   Relevant Orders   CBC With Differential    Return in about 3 months (around 06/16/2023).   Total time spent:  Margaretann Loveless, MD  03/17/2023

## 2023-03-18 DIAGNOSIS — M25561 Pain in right knee: Secondary | ICD-10-CM | POA: Diagnosis not present

## 2023-03-18 DIAGNOSIS — M25661 Stiffness of right knee, not elsewhere classified: Secondary | ICD-10-CM | POA: Diagnosis not present

## 2023-03-23 ENCOUNTER — Ambulatory Visit (INDEPENDENT_AMBULATORY_CARE_PROVIDER_SITE_OTHER): Payer: Medicare PPO

## 2023-03-23 DIAGNOSIS — R072 Precordial pain: Secondary | ICD-10-CM

## 2023-03-23 DIAGNOSIS — R079 Chest pain, unspecified: Secondary | ICD-10-CM

## 2023-03-23 DIAGNOSIS — I2081 Angina pectoris with coronary microvascular dysfunction: Secondary | ICD-10-CM

## 2023-03-23 DIAGNOSIS — I10 Essential (primary) hypertension: Secondary | ICD-10-CM

## 2023-03-23 DIAGNOSIS — E7800 Pure hypercholesterolemia, unspecified: Secondary | ICD-10-CM

## 2023-03-23 DIAGNOSIS — E118 Type 2 diabetes mellitus with unspecified complications: Secondary | ICD-10-CM

## 2023-03-23 DIAGNOSIS — G4733 Obstructive sleep apnea (adult) (pediatric): Secondary | ICD-10-CM

## 2023-03-23 MED ORDER — IOHEXOL 350 MG/ML SOLN
100.0000 mL | Freq: Once | INTRAVENOUS | Status: AC | PRN
  Administered 2023-03-23: 100 mL via INTRAVENOUS

## 2023-03-24 ENCOUNTER — Encounter: Payer: Self-pay | Admitting: Cardiovascular Disease

## 2023-03-24 ENCOUNTER — Ambulatory Visit (INDEPENDENT_AMBULATORY_CARE_PROVIDER_SITE_OTHER): Payer: Medicare PPO | Admitting: Cardiovascular Disease

## 2023-03-24 VITALS — BP 126/64 | HR 72 | Ht 62.0 in | Wt 210.0 lb

## 2023-03-24 DIAGNOSIS — I10 Essential (primary) hypertension: Secondary | ICD-10-CM

## 2023-03-24 DIAGNOSIS — R079 Chest pain, unspecified: Secondary | ICD-10-CM

## 2023-03-24 NOTE — Progress Notes (Signed)
Cardiology Office Note   Date:  03/24/2023   ID:  Christina Mcclain, DOB 07/20/1956, MRN [id number]  PCP:  Margaretann Loveless, MD  Cardiologist:  Adrian Blackwater, MD      History of Present Illness: Christina Mcclain is a 67 y.o. female who presents for  Chief Complaint  Patient presents with   Follow-up    CCTA results    Patient in office to discuss results of CCTA done yesterday. Denies chest pain, shortness of breath.    Past Medical History:  Diagnosis Date   Adverse effect of anesthesia    hard to awaken   Anemia    Arthritis    Asthma    Asthma without status asthmaticus    unspecified; Take advair each night   Breast cancer (HCC) 2021   right breast IMC   Bronchitis    Chronic kidney disease    stage III per dr Cherylann Ratel   Constipation    Diabetes (HCC) 2010   Diabetes mellitus type 2, uncomplicated (HCC)    Diabetes mellitus without complication (HCC)    Encounter for blood transfusion    hysterectomy 9-10 years ago   Heart murmur    unspecified   Hyperlipidemia    Hypertension 1991   Hypothyroid    Multiple thyroid nodules    Neuromuscular disorder (HCC)    neuropathy   Osteoarthritis of right hip 02/04/2017   Palpitations    Pernicious anemia    Personal history of chemotherapy 2020-2021   Right breast ca   Personal history of radiation therapy 2021   right breast [facility]   Pneumonia    Sleep apnea    Use C-PAP; can't use mouth piece. Anxiety   Thyroid disease    nodules   Thyroid nodule    x2   Urinary urgency      Past Surgical History:  Procedure Laterality Date   ABDOMINAL HYSTERECTOMY  2011   partial   ABDOMINAL SURGERY     BREAST BIOPSY Right 10/02/2020   affirm bx, x marker, fat necrosis   BREAST BIOPSY Right 10/19/2019   two masses at 9:00 [place] bx, coil marker and venus marker, IMC for both   BREAST LUMPECTOMY Right 03/28/2020   bracketing of two IMC masses at 9:00.  Venus marker was pushed medially during wire loc. Remains in  breast.  Clear surgical margins. Negative Lymph node   CARPAL TUNNEL RELEASE Left 2008   CARPAL TUNNEL RELEASE Right    CHOLECYSTECTOMY  1982   COLONOSCOPY WITH PROPOFOL N/A 10/05/2017   Procedure: COLONOSCOPY WITH PROPOFOL;  Surgeon: Scot Jun, MD;  Location: [facility] ENDOSCOPY;  Service: Endoscopy;  Laterality: N/A;   COLONOSCOPY WITH PROPOFOL N/A 08/09/2021   Procedure: COLONOSCOPY WITH PROPOFOL;  Surgeon: Jaynie Collins, DO;  Location: [facility] ENDOSCOPY;  Service: Gastroenterology;  Laterality: N/A;   DIAGNOSTIC LAPAROSCOPY     ESOPHAGOGASTRODUODENOSCOPY (EGD) WITH PROPOFOL N/A 08/09/2021   Procedure: ESOPHAGOGASTRODUODENOSCOPY (EGD) WITH PROPOFOL;  Surgeon: Jaynie Collins, DO;  Location: [facility] ENDOSCOPY;  Service: Gastroenterology;  Laterality: N/A;   JOINT REPLACEMENT Right 02/09/2017   TOTAL HIP REPLACEMENT, DR. CARTER. [place]   KNEE ARTHROSCOPY  1990s   PARTIAL MASTECTOMY WITH NEEDLE LOCALIZATION AND AXILLARY SENTINEL LYMPH NODE BX Right 03/28/2020   Procedure: PARTIAL MASTECTOMY WITH NEEDLE LOCALIZATION AND AXILLARY SENTINEL LYMPH NODE BX;  Surgeon: Carolan Shiver, MD;  Location: [facility] ORS;  Service: General;  Laterality: Right;   PORTACATH PLACEMENT Left 11/07/2019   Procedure: INSERTION  PORT-A-CATH;  Surgeon: Carolan Shiver, MD;  Location: [facility] ORS;  Service: General;  Laterality: Left;   TOTAL HIP ARTHROPLASTY Left 06/23/2017   Procedure: TOTAL HIP ARTHROPLASTY ANTERIOR APPROACH;  Surgeon: Kennedy Bucker, MD;  Location: [facility] ORS;  Service: Orthopedics;  Laterality: Left;     Current Outpatient Medications  Medication Sig Dispense Refill   acetaminophen (TYLENOL) 500 MG tablet Take 500 mg by mouth every 6 (six) hours as needed.     albuterol (PROVENTIL HFA;VENTOLIN HFA) 108 (90 Base) MCG/ACT inhaler Inhale 2 puffs into the lungs every 6 (six) hours as needed for wheezing or shortness of breath.     CALCIUM CITRATE PO Take 1,000 mg by mouth daily.      carvedilol (COREG) 25 MG tablet Take 25 mg by mouth 2 (two) times daily with a meal.      cholecalciferol (VITAMIN D) 1000 units tablet Take 2,000 Units by mouth daily.      ezetimibe (ZETIA) 10 MG tablet Take 10 mg by mouth daily.     Fluticasone-Salmeterol (ADVAIR) 250-50 MCG/DOSE AEPB Inhale 1 puff into the lungs 2 (two) times daily.      fluticasone-salmeterol (WIXELA INHUB) 250-50 MCG/ACT AEPB Inhale 1 puff into the lungs in the morning and at bedtime.     furosemide (LASIX) 20 MG tablet      hydrALAZINE (APRESOLINE) 25 MG tablet Take 1 tablet (25 mg total) by mouth 2 times daily at 12 noon and 4 pm. 180 tablet 3   letrozole (FEMARA) 2.5 MG tablet TAKE 1 TABLET(2.5 MG) BY MOUTH DAILY 90 tablet 2   lidocaine-prilocaine (EMLA) cream APPLY 1 APPLICATION TOPICALLY AS NEEDED 30 g 1   loratadine (CLARITIN) 10 MG tablet Take 10 mg by mouth daily as needed for allergies.      losartan (COZAAR) 25 MG tablet      magnesium oxide (MAG-OX) 400 MG tablet Take 2 tablets by mouth daily.     meloxicam (MOBIC) 15 MG tablet Take 15 mg by mouth daily.     pregabalin (LYRICA) 50 MG capsule Take 25 mg by mouth 2 (two) times daily.     solifenacin (VESICARE) 10 MG tablet Take 10 mg by mouth every evening.     traMADol (ULTRAM) 50 MG tablet Take 1 tablet (50 mg total) by mouth every 6 (six) hours as needed. 30 tablet 0   No current facility-administered medications for this visit.    Allergies:   Amlodipine and Statins    Social History:   reports that she has never smoked. She has never used smokeless tobacco. She reports that she does not drink alcohol and does not use drugs.   Family History:  family history includes Arrhythmia in her mother; Coronary artery disease in her maternal uncle; Diabetes in her brother, mother, and sister; Heart attack in her maternal uncle and mother; Heart disease in her brother and mother; Hypertension in her father and mother; Parkinson's disease in her father.    ROS:      Review of Systems  Constitutional: Negative.   HENT: Negative.    Eyes: Negative.   Respiratory: Negative.    Gastrointestinal: Negative.   Genitourinary: Negative.   Musculoskeletal: Negative.   Skin: Negative.   Neurological: Negative.   Endo/Heme/Allergies: Negative.   Psychiatric/Behavioral: Negative.    All other systems reviewed and are negative.   All other systems are reviewed and negative.   PHYSICAL EXAM: VS:  BP 126/64   Pulse 72   Ht 5'  2" (1.575 m)   Wt 210 lb (95.3 kg)   SpO2 95%   BMI 38.41 kg/m  , BMI Body mass index is 38.41 kg/m. Last weight:  Wt Readings from Last 3 Encounters:  03/24/23 210 lb (95.3 kg)  03/17/23 212 lb (96.2 kg)  03/10/23 212 lb 12.8 oz (96.5 kg)    Physical Exam Constitutional:      Appearance: Normal appearance.  Cardiovascular:     Rate and Rhythm: Normal rate and regular rhythm.     Heart sounds: Normal heart sounds.  Pulmonary:     Effort: Pulmonary effort is normal.     Breath sounds: Normal breath sounds.  Musculoskeletal:     Right lower leg: No edema.     Left lower leg: No edema.  Neurological:     Mental Status: She is alert.     EKG: none today  Recent Labs: 01/23/2023: ALT 13; Hemoglobin 8.5; Platelets 193 03/10/2023: BUN 27; Creatinine, Ser 1.45; Potassium 4.6; Sodium 144    Lipid Panel No results found for: "CHOL", "TRIG", "HDL", "CHOLHDL", "VLDL", "LDLCALC", "LDLDIRECT"    Other studies Reviewed: CCTA 03/23/23   ASSESSMENT AND PLAN:    ICD-10-CM   1. Chest pain, unspecified type  R07.9     2. Essential hypertension, benign  I10        Problem List Items Addressed This Visit       Cardiovascular and Mediastinum   Essential hypertension, benign    Well controlled. Continue same medications.        Other   Chest pain, unspecified - Primary    CCTA score 3.7. Patient reports chest pain feels like indigestion.        Disposition:   Return in about 4 months (around 07/24/2023).     Total time spent: 30 minutes  Signed,  Adrian Blackwater, MD  03/24/2023 11:02 AM    [facility]

## 2023-03-24 NOTE — Assessment & Plan Note (Signed)
CCTA score 3.7. Patient reports chest pain feels like indigestion.

## 2023-03-24 NOTE — Assessment & Plan Note (Signed)
Well controlled. Continue same medications. 

## 2023-03-26 DIAGNOSIS — M25561 Pain in right knee: Secondary | ICD-10-CM | POA: Diagnosis not present

## 2023-03-26 DIAGNOSIS — M25661 Stiffness of right knee, not elsewhere classified: Secondary | ICD-10-CM | POA: Diagnosis not present

## 2023-04-01 DIAGNOSIS — M25561 Pain in right knee: Secondary | ICD-10-CM | POA: Diagnosis not present

## 2023-04-01 DIAGNOSIS — M25661 Stiffness of right knee, not elsewhere classified: Secondary | ICD-10-CM | POA: Diagnosis not present

## 2023-04-02 ENCOUNTER — Telehealth: Payer: Self-pay | Admitting: Internal Medicine

## 2023-04-02 NOTE — Telephone Encounter (Signed)
Entered in error

## 2023-04-03 DIAGNOSIS — M79662 Pain in left lower leg: Secondary | ICD-10-CM | POA: Diagnosis not present

## 2023-04-03 DIAGNOSIS — M1712 Unilateral primary osteoarthritis, left knee: Secondary | ICD-10-CM | POA: Diagnosis not present

## 2023-04-03 DIAGNOSIS — Z96651 Presence of right artificial knee joint: Secondary | ICD-10-CM | POA: Diagnosis not present

## 2023-04-03 DIAGNOSIS — M25561 Pain in right knee: Secondary | ICD-10-CM | POA: Diagnosis not present

## 2023-04-03 DIAGNOSIS — M25661 Stiffness of right knee, not elsewhere classified: Secondary | ICD-10-CM | POA: Diagnosis not present

## 2023-04-08 DIAGNOSIS — M25661 Stiffness of right knee, not elsewhere classified: Secondary | ICD-10-CM | POA: Diagnosis not present

## 2023-04-08 DIAGNOSIS — M25561 Pain in right knee: Secondary | ICD-10-CM | POA: Diagnosis not present

## 2023-04-09 ENCOUNTER — Encounter: Payer: Self-pay | Admitting: Internal Medicine

## 2023-04-09 ENCOUNTER — Ambulatory Visit (INDEPENDENT_AMBULATORY_CARE_PROVIDER_SITE_OTHER): Payer: Medicare PPO | Admitting: Internal Medicine

## 2023-04-09 VITALS — BP 160/90 | HR 65 | Ht 62.0 in | Wt 211.6 lb

## 2023-04-09 DIAGNOSIS — K219 Gastro-esophageal reflux disease without esophagitis: Secondary | ICD-10-CM | POA: Diagnosis not present

## 2023-04-09 DIAGNOSIS — R1030 Lower abdominal pain, unspecified: Secondary | ICD-10-CM

## 2023-04-09 DIAGNOSIS — G4733 Obstructive sleep apnea (adult) (pediatric): Secondary | ICD-10-CM | POA: Diagnosis not present

## 2023-04-09 DIAGNOSIS — E782 Mixed hyperlipidemia: Secondary | ICD-10-CM | POA: Diagnosis not present

## 2023-04-09 DIAGNOSIS — N390 Urinary tract infection, site not specified: Secondary | ICD-10-CM | POA: Insufficient documentation

## 2023-04-09 DIAGNOSIS — I10 Essential (primary) hypertension: Secondary | ICD-10-CM | POA: Diagnosis not present

## 2023-04-09 LAB — POCT URINALYSIS DIPSTICK
Bilirubin, UA: NEGATIVE
Blood, UA: NEGATIVE
Glucose, UA: NEGATIVE
Ketones, UA: NEGATIVE
Leukocytes, UA: NEGATIVE
Nitrite, UA: NEGATIVE
Protein, UA: NEGATIVE
Spec Grav, UA: 1.02 (ref 1.010–1.025)
Urobilinogen, UA: 0.2 E.U./dL
pH, UA: 8.5 — AB (ref 5.0–8.0)

## 2023-04-09 NOTE — Progress Notes (Signed)
Established Patient Office Visit  Subjective:  Patient ID: Christina Mcclain, female    DOB: 1955-12-09  Age: 67 y.o. MRN: [id number]  Chief Complaint  Patient presents with   Acute Visit    bloating    Patient comes in with complaints of abdominal swelling and bloating for the last 1 week or so.  She says she was feeling some discomfort in her right upper abdomen as well.  Patient is status post cholecystectomy in 1982.  However she just decided to start using her Lasix tablet which helped get rid of her abdominal swelling as well as ankle swelling.  Today she is actually feeling better than before.  Patient admits to eating more salads these days and is possible that the lettuce was causing some of the bloating.  Patient had also started taking a probiotic but more than 1/day and advised to cut back and take  the recommended dose only. There is no complaints of nausea or vomiting, no diarrhea or constipation, and no blood in stools..  Patient's appetite is stable. Patient had a urine dipstick done in the office which was unremarkable. Blood pressure is high today-needs to be monitored.    No other concerns at this time.   Past Medical History:  Diagnosis Date   Adverse effect of anesthesia    hard to awaken   Anemia    Arthritis    Asthma    Asthma without status asthmaticus    unspecified; Take advair each night   Breast cancer (HCC) 2021   right breast IMC   Bronchitis    Chronic kidney disease    stage III per dr Cherylann Ratel   Constipation    Diabetes (HCC) 2010   Diabetes mellitus type 2, uncomplicated (HCC)    Diabetes mellitus without complication (HCC)    Encounter for blood transfusion    hysterectomy 9-10 years ago   Heart murmur    unspecified   Hyperlipidemia    Hypertension 1991   Hypothyroid    Multiple thyroid nodules    Neuromuscular disorder (HCC)    neuropathy   Osteoarthritis of right hip 02/04/2017   Palpitations    Pernicious anemia    Personal  history of chemotherapy 2020-2021   Right breast ca   Personal history of radiation therapy 2021   right breast [facility]   Pneumonia    Sleep apnea    Use C-PAP; can't use mouth piece. Anxiety   Thyroid disease    nodules   Thyroid nodule    x2   Urinary urgency     Past Surgical History:  Procedure Laterality Date   ABDOMINAL HYSTERECTOMY  2011   partial   ABDOMINAL SURGERY     BREAST BIOPSY Right 10/02/2020   affirm bx, x marker, fat necrosis   BREAST BIOPSY Right 10/19/2019   two masses at 9:00 [place] bx, coil marker and venus marker, IMC for both   BREAST LUMPECTOMY Right 03/28/2020   bracketing of two IMC masses at 9:00.  Venus marker was pushed medially during wire loc. Remains in breast.  Clear surgical margins. Negative Lymph node   CARPAL TUNNEL RELEASE Left 2008   CARPAL TUNNEL RELEASE Right    CHOLECYSTECTOMY  1982   COLONOSCOPY WITH PROPOFOL N/A 10/05/2017   Procedure: COLONOSCOPY WITH PROPOFOL;  Surgeon: Scot Jun, MD;  Location: [facility] ENDOSCOPY;  Service: Endoscopy;  Laterality: N/A;   COLONOSCOPY WITH PROPOFOL N/A 08/09/2021   Procedure: COLONOSCOPY WITH PROPOFOL;  Surgeon: Elfredia Nevins  Casimiro Needle, DO;  Location: [facility] ENDOSCOPY;  Service: Gastroenterology;  Laterality: N/A;   DIAGNOSTIC LAPAROSCOPY     ESOPHAGOGASTRODUODENOSCOPY (EGD) WITH PROPOFOL N/A 08/09/2021   Procedure: ESOPHAGOGASTRODUODENOSCOPY (EGD) WITH PROPOFOL;  Surgeon: Jaynie Collins, DO;  Location: [facility] ENDOSCOPY;  Service: Gastroenterology;  Laterality: N/A;   JOINT REPLACEMENT Right 02/09/2017   TOTAL HIP REPLACEMENT, DR. CARTER. [place]   KNEE ARTHROSCOPY  1990s   PARTIAL MASTECTOMY WITH NEEDLE LOCALIZATION AND AXILLARY SENTINEL LYMPH NODE BX Right 03/28/2020   Procedure: PARTIAL MASTECTOMY WITH NEEDLE LOCALIZATION AND AXILLARY SENTINEL LYMPH NODE BX;  Surgeon: Carolan Shiver, MD;  Location: [facility] ORS;  Service: General;  Laterality: Right;   PORTACATH PLACEMENT Left 11/07/2019    Procedure: INSERTION PORT-A-CATH;  Surgeon: Carolan Shiver, MD;  Location: [facility] ORS;  Service: General;  Laterality: Left;   TOTAL HIP ARTHROPLASTY Left 06/23/2017   Procedure: TOTAL HIP ARTHROPLASTY ANTERIOR APPROACH;  Surgeon: Kennedy Bucker, MD;  Location: [facility] ORS;  Service: Orthopedics;  Laterality: Left;    Social History   Socioeconomic History   Marital status: Married    Spouse name: Not on file   Number of children: 2   Years of education: some college   Highest education level: Not on file  Occupational History   Occupation: retired    Comment: [employer]  Tobacco Use   Smoking status: Never   Smokeless tobacco: Never  Building services engineer Use: Never used  Substance and Sexual Activity   Alcohol use: No   Drug use: No   Sexual activity: Not Currently  Other Topics Concern   Not on file  Social History Narrative   Not on file   Social Determinants of Health   Financial Resource Strain: Not on file  Food Insecurity: Not on file  Transportation Needs: Not on file  Physical Activity: Not on file  Stress: Not on file  Social Connections: Not on file  Intimate Partner Violence: Not on file    Family History  Problem Relation Age of Onset   Heart disease Mother    Hypertension Mother    Arrhythmia Mother    Heart attack Mother    Diabetes Mother    Hypertension Father    Parkinson's disease Father    Coronary artery disease Maternal Uncle    Heart attack Maternal Uncle    Diabetes Sister    Diabetes Brother    Heart disease Brother    Breast cancer Neg Hx     Allergies  Allergen Reactions   Amlodipine Other (See Comments)    edema   Statins Rash    Other reaction(s): Muscle Pain Possible hives also    Review of Systems  Constitutional: Negative.  Negative for diaphoresis, fever, malaise/fatigue and weight loss.  HENT: Negative.    Eyes: Negative.   Respiratory:  Negative for cough, shortness of breath and wheezing.    Cardiovascular:  Negative for chest pain, palpitations, leg swelling and PND.  Gastrointestinal:  Negative for abdominal pain, blood in stool, constipation, diarrhea, heartburn, melena, nausea and vomiting.  Genitourinary:  Negative for dysuria, flank pain, frequency and urgency.  Musculoskeletal:  Positive for back pain. Negative for falls, joint pain, myalgias and neck pain.  Neurological: Negative.   Psychiatric/Behavioral:  Negative for depression. The patient is not nervous/anxious.        Objective:   BP (!) 160/90   Pulse 65   Ht 5\' 2"  (1.575 m)   Wt 211 lb 9.6 oz (96  kg)   SpO2 97%   BMI 38.70 kg/m   Vitals:   04/09/23 1001  BP: (!) 160/90  Pulse: 65  Height: 5\' 2"  (1.575 m)  Weight: 211 lb 9.6 oz (96 kg)  SpO2: 97%  BMI (Calculated): 38.69    Physical Exam Vitals and nursing note reviewed.  Constitutional:      Appearance: Normal appearance.  Cardiovascular:     Rate and Rhythm: Normal rate and regular rhythm.     Pulses: Normal pulses.  Pulmonary:     Effort: Pulmonary effort is normal.  Abdominal:     General: Bowel sounds are normal. There is no distension.     Palpations: Abdomen is soft. There is no mass.     Tenderness: There is no abdominal tenderness. There is no right CVA tenderness, left CVA tenderness, guarding or rebound.     Hernia: No hernia is present.  Musculoskeletal:        General: No swelling or deformity. Normal range of motion.     Cervical back: Normal range of motion and neck supple.     Right lower leg: No edema.     Left lower leg: No edema.  Skin:    General: Skin is warm and dry.  Neurological:     General: No focal deficit present.     Mental Status: She is alert and oriented to person, place, and time.  Psychiatric:        Mood and Affect: Mood normal.        Behavior: Behavior normal.      Results for orders placed or performed in visit on 04/09/23  POCT Urinalysis Dipstick (81002)  Result Value Ref Range    Color, UA yelloe    Clarity, UA clear    Glucose, UA Negative Negative   Bilirubin, UA negative    Ketones, UA negative    Spec Grav, UA 1.020 1.010 - 1.025   Blood, UA negative    pH, UA 8.5 (A) 5.0 - 8.0   Protein, UA Negative Negative   Urobilinogen, UA 0.2 0.2 or 1.0 E.U./dL   Nitrite, UA negative    Leukocytes, UA Negative Negative   Appearance clear    Odor none         Assessment & Plan:  Will stop send a prescription for Protonix tablet to be started today.  Patient advised to be careful with her diet.  Encouraged adequate hydration.Patient will monitor her blood pressure at home and avoid certain foods which upset her stomach. Problem List Items Addressed This Visit     Gastro-esophageal reflux disease without esophagitis   Relevant Medications   pantoprazole (PROTONIX) 40 MG tablet   Essential hypertension, benign   Obstructive sleep apnea syndrome   Urinary tract infection without hematuria - Primary   Mixed hyperlipidemia   Follow up as scheduled.  Total time spent: 30 minutes  Margaretann Loveless, MD  04/09/2023   This document may have been prepared by [facility] Voice Recognition software and as such may include unintentional dictation errors.

## 2023-04-15 DIAGNOSIS — M25661 Stiffness of right knee, not elsewhere classified: Secondary | ICD-10-CM | POA: Diagnosis not present

## 2023-04-15 DIAGNOSIS — M25561 Pain in right knee: Secondary | ICD-10-CM | POA: Diagnosis not present

## 2023-04-17 DIAGNOSIS — M25661 Stiffness of right knee, not elsewhere classified: Secondary | ICD-10-CM | POA: Diagnosis not present

## 2023-04-17 DIAGNOSIS — M25561 Pain in right knee: Secondary | ICD-10-CM | POA: Diagnosis not present

## 2023-04-20 ENCOUNTER — Other Ambulatory Visit: Payer: Self-pay | Admitting: Internal Medicine

## 2023-04-20 DIAGNOSIS — I10 Essential (primary) hypertension: Secondary | ICD-10-CM

## 2023-04-21 DIAGNOSIS — L218 Other seborrheic dermatitis: Secondary | ICD-10-CM | POA: Diagnosis not present

## 2023-04-22 DIAGNOSIS — M25561 Pain in right knee: Secondary | ICD-10-CM | POA: Diagnosis not present

## 2023-04-22 DIAGNOSIS — M25661 Stiffness of right knee, not elsewhere classified: Secondary | ICD-10-CM | POA: Diagnosis not present

## 2023-04-23 ENCOUNTER — Other Ambulatory Visit: Payer: Self-pay | Admitting: *Deleted

## 2023-04-23 ENCOUNTER — Inpatient Hospital Stay: Payer: Medicare PPO | Attending: Oncology

## 2023-04-23 ENCOUNTER — Telehealth: Payer: Self-pay | Admitting: *Deleted

## 2023-04-23 DIAGNOSIS — R5383 Other fatigue: Secondary | ICD-10-CM

## 2023-04-23 DIAGNOSIS — D649 Anemia, unspecified: Secondary | ICD-10-CM | POA: Diagnosis not present

## 2023-04-23 DIAGNOSIS — Z170 Estrogen receptor positive status [ER+]: Secondary | ICD-10-CM | POA: Diagnosis not present

## 2023-04-23 DIAGNOSIS — C50911 Malignant neoplasm of unspecified site of right female breast: Secondary | ICD-10-CM | POA: Insufficient documentation

## 2023-04-23 DIAGNOSIS — C50919 Malignant neoplasm of unspecified site of unspecified female breast: Secondary | ICD-10-CM

## 2023-04-23 DIAGNOSIS — Z9221 Personal history of antineoplastic chemotherapy: Secondary | ICD-10-CM | POA: Diagnosis not present

## 2023-04-23 DIAGNOSIS — Z79899 Other long term (current) drug therapy: Secondary | ICD-10-CM | POA: Diagnosis not present

## 2023-04-23 LAB — CBC
HCT: 35.5 % — ABNORMAL LOW (ref 36.0–46.0)
Hemoglobin: 10.5 g/dL — ABNORMAL LOW (ref 12.0–15.0)
MCH: 23.8 pg — ABNORMAL LOW (ref 26.0–34.0)
MCHC: 29.6 g/dL — ABNORMAL LOW (ref 30.0–36.0)
MCV: 80.3 fL (ref 80.0–100.0)
Platelets: 174 10*3/uL (ref 150–400)
RBC: 4.42 MIL/uL (ref 3.87–5.11)
RDW: 18.3 % — ABNORMAL HIGH (ref 11.5–15.5)
WBC: 2.8 10*3/uL — ABNORMAL LOW (ref 4.0–10.5)
nRBC: 0 % (ref 0.0–0.2)

## 2023-04-23 LAB — IRON AND TIBC
Iron: 42 ug/dL (ref 28–170)
Saturation Ratios: 12 % (ref 10.4–31.8)
TIBC: 349 ug/dL (ref 250–450)
UIBC: 307 ug/dL

## 2023-04-23 LAB — VITAMIN B12: Vitamin B-12: 713 pg/mL (ref 180–914)

## 2023-04-23 LAB — FERRITIN: Ferritin: 15 ng/mL (ref 11–307)

## 2023-04-23 NOTE — Telephone Encounter (Signed)
-----   Message from Creig Hines, MD sent at 04/23/2023 11:15 AM EDT ----- She should take oral iron once daily. If she cannot tolerate it, we can offer iv iron

## 2023-04-23 NOTE — Telephone Encounter (Signed)
Called pt and left message and let her know the iron iron studies, ferritin low and dr Smith Robert suggested to take ferrous sulfate and it is 325 mg daily and take it on a full stomach. It can make constipation and some times belly pain but not with all people. Left my direct phone number to call .

## 2023-04-24 ENCOUNTER — Telehealth: Payer: Self-pay | Admitting: *Deleted

## 2023-04-24 NOTE — Telephone Encounter (Signed)
I called pt back again to see if she got my message. The patient answered. The patient would try the oral iron, she will go and find it today and start it and I told her to make sure that has good meal and then take the pill daily. She can have constipation and irritate stomach some and the stool color can get dark. Pt. Also said that she has been having leg aches and taking meloxicam and it helps and her left breast has been throbbing. It is not the breast that has cancer- it is the other one. I told her that I will check with Smith Robert when she comes in today

## 2023-04-28 DIAGNOSIS — M25561 Pain in right knee: Secondary | ICD-10-CM | POA: Diagnosis not present

## 2023-04-28 DIAGNOSIS — M25661 Stiffness of right knee, not elsewhere classified: Secondary | ICD-10-CM | POA: Diagnosis not present

## 2023-04-30 DIAGNOSIS — M25561 Pain in right knee: Secondary | ICD-10-CM | POA: Diagnosis not present

## 2023-04-30 DIAGNOSIS — M25661 Stiffness of right knee, not elsewhere classified: Secondary | ICD-10-CM | POA: Diagnosis not present

## 2023-05-06 DIAGNOSIS — M25561 Pain in right knee: Secondary | ICD-10-CM | POA: Diagnosis not present

## 2023-05-06 DIAGNOSIS — M25661 Stiffness of right knee, not elsewhere classified: Secondary | ICD-10-CM | POA: Diagnosis not present

## 2023-05-08 DIAGNOSIS — M25661 Stiffness of right knee, not elsewhere classified: Secondary | ICD-10-CM | POA: Diagnosis not present

## 2023-05-08 DIAGNOSIS — M25561 Pain in right knee: Secondary | ICD-10-CM | POA: Diagnosis not present

## 2023-05-11 DIAGNOSIS — M25561 Pain in right knee: Secondary | ICD-10-CM | POA: Diagnosis not present

## 2023-05-11 DIAGNOSIS — M25661 Stiffness of right knee, not elsewhere classified: Secondary | ICD-10-CM | POA: Diagnosis not present

## 2023-05-13 DIAGNOSIS — M25561 Pain in right knee: Secondary | ICD-10-CM | POA: Diagnosis not present

## 2023-05-13 DIAGNOSIS — M25661 Stiffness of right knee, not elsewhere classified: Secondary | ICD-10-CM | POA: Diagnosis not present

## 2023-05-20 DIAGNOSIS — M25561 Pain in right knee: Secondary | ICD-10-CM | POA: Diagnosis not present

## 2023-05-20 DIAGNOSIS — M25661 Stiffness of right knee, not elsewhere classified: Secondary | ICD-10-CM | POA: Diagnosis not present

## 2023-05-22 DIAGNOSIS — M25561 Pain in right knee: Secondary | ICD-10-CM | POA: Diagnosis not present

## 2023-05-22 DIAGNOSIS — M25661 Stiffness of right knee, not elsewhere classified: Secondary | ICD-10-CM | POA: Diagnosis not present

## 2023-05-25 DIAGNOSIS — N1831 Chronic kidney disease, stage 3a: Secondary | ICD-10-CM | POA: Diagnosis not present

## 2023-05-25 DIAGNOSIS — M25561 Pain in right knee: Secondary | ICD-10-CM | POA: Diagnosis not present

## 2023-05-25 DIAGNOSIS — M25661 Stiffness of right knee, not elsewhere classified: Secondary | ICD-10-CM | POA: Diagnosis not present

## 2023-05-26 DIAGNOSIS — N2581 Secondary hyperparathyroidism of renal origin: Secondary | ICD-10-CM | POA: Diagnosis not present

## 2023-05-26 DIAGNOSIS — D631 Anemia in chronic kidney disease: Secondary | ICD-10-CM | POA: Diagnosis not present

## 2023-05-26 DIAGNOSIS — N1832 Chronic kidney disease, stage 3b: Secondary | ICD-10-CM | POA: Diagnosis not present

## 2023-05-26 DIAGNOSIS — I10 Essential (primary) hypertension: Secondary | ICD-10-CM | POA: Diagnosis not present

## 2023-05-27 DIAGNOSIS — M25661 Stiffness of right knee, not elsewhere classified: Secondary | ICD-10-CM | POA: Diagnosis not present

## 2023-05-27 DIAGNOSIS — M25561 Pain in right knee: Secondary | ICD-10-CM | POA: Diagnosis not present

## 2023-06-02 ENCOUNTER — Other Ambulatory Visit: Payer: Self-pay | Admitting: Internal Medicine

## 2023-06-02 DIAGNOSIS — R200 Anesthesia of skin: Secondary | ICD-10-CM

## 2023-06-03 DIAGNOSIS — M1712 Unilateral primary osteoarthritis, left knee: Secondary | ICD-10-CM | POA: Diagnosis not present

## 2023-06-03 DIAGNOSIS — Z96651 Presence of right artificial knee joint: Secondary | ICD-10-CM | POA: Diagnosis not present

## 2023-06-16 ENCOUNTER — Ambulatory Visit: Payer: Medicare PPO | Admitting: Internal Medicine

## 2023-06-26 ENCOUNTER — Ambulatory Visit
Admission: RE | Admit: 2023-06-26 | Discharge: 2023-06-26 | Disposition: A | Discharge status: Home / Self Care | Payer: Medicare PPO | Source: Ambulatory Visit | Attending: Orthopedic Surgery | Admitting: Orthopedic Surgery

## 2023-06-26 ENCOUNTER — Other Ambulatory Visit: Payer: Self-pay | Admitting: Orthopedic Surgery

## 2023-06-26 DIAGNOSIS — R2242 Localized swelling, mass and lump, left lower limb: Secondary | ICD-10-CM | POA: Diagnosis not present

## 2023-06-26 DIAGNOSIS — M1712 Unilateral primary osteoarthritis, left knee: Secondary | ICD-10-CM | POA: Diagnosis not present

## 2023-06-26 DIAGNOSIS — Z96651 Presence of right artificial knee joint: Secondary | ICD-10-CM | POA: Diagnosis not present

## 2023-06-26 DIAGNOSIS — M7989 Other specified soft tissue disorders: Secondary | ICD-10-CM | POA: Diagnosis not present

## 2023-07-02 ENCOUNTER — Encounter: Payer: Self-pay | Admitting: Internal Medicine

## 2023-07-02 ENCOUNTER — Ambulatory Visit (INDEPENDENT_AMBULATORY_CARE_PROVIDER_SITE_OTHER): Payer: Medicare PPO | Admitting: Internal Medicine

## 2023-07-02 VITALS — BP 164/86 | HR 81 | Temp 97.3°F | Ht 62.0 in | Wt 216.2 lb

## 2023-07-02 DIAGNOSIS — J069 Acute upper respiratory infection, unspecified: Secondary | ICD-10-CM | POA: Diagnosis not present

## 2023-07-02 DIAGNOSIS — E782 Mixed hyperlipidemia: Secondary | ICD-10-CM

## 2023-07-02 DIAGNOSIS — E118 Type 2 diabetes mellitus with unspecified complications: Secondary | ICD-10-CM | POA: Diagnosis not present

## 2023-07-02 LAB — POCT CBG (FASTING - GLUCOSE)-MANUAL ENTRY: Glucose Fasting, POC: 111 mg/dL — AB (ref 70–99)

## 2023-07-02 MED ORDER — AMOXICILLIN-POT CLAVULANATE 500-125 MG PO TABS
1.0000 | ORAL_TABLET | Freq: Two times a day (BID) | ORAL | 0 refills | Status: AC
Start: 2023-07-02 — End: 2023-07-09

## 2023-07-02 MED ORDER — LETROZOLE 2.5 MG PO TABS: 2.5000 mg | ORAL_TABLET | Freq: Every day | ORAL | 2 refills | Status: DC

## 2023-07-02 MED ORDER — EZETIMIBE 10 MG PO TABS
10.0000 mg | ORAL_TABLET | Freq: Every day | ORAL | 3 refills | Status: DC
Start: 2023-07-02 — End: 2023-07-17

## 2023-07-02 NOTE — Progress Notes (Signed)
Established Patient Office Visit  Subjective:  Patient ID: Christina Mcclain, female    DOB: 1955-12-04  Age: 67 y.o. MRN: [id number]  Chief Complaint  Patient presents with   Follow-up    3 month follow up    Patient comes in for her follow-up of hypertension.  However she is also suffering from upper respiratory tract infection which started 5 days ago.  Her home COVID test was negative.  Her nasal discharge is clear but she has a lot of sinus and nasal congestion.  Now she has started coughing and brings up yellowish sputum.  Had some chills and felt feverish last night.  No nausea vomiting or diarrhea. Patient is planning to leave on a cruise in few days.  Will add Augmentin to be taken twice a day along with rest and fluids. Increase her hydralazine to 50 mg twice a day for better blood pressure control. Labs were done at her nephrologist's office recently.    No other concerns at this time.   Past Medical History:  Diagnosis Date   Adverse effect of anesthesia    hard to awaken   Anemia    Arthritis    Asthma    Asthma without status asthmaticus    unspecified; Take advair each night   Breast cancer (HCC) 2021   right breast IMC   Bronchitis    Chronic kidney disease    stage III per dr Cherylann Ratel   Constipation    Diabetes (HCC) 2010   Diabetes mellitus type 2, uncomplicated (HCC)    Diabetes mellitus without complication (HCC)    Encounter for blood transfusion    hysterectomy 9-10 years ago   Heart murmur    unspecified   Hyperlipidemia    Hypertension 1991   Hypothyroid    Multiple thyroid nodules    Neuromuscular disorder (HCC)    neuropathy   Osteoarthritis of right hip 02/04/2017   Palpitations    Pernicious anemia    Personal history of chemotherapy 2020-2021   Right breast ca   Personal history of radiation therapy 2021   right breast [facility]   Pneumonia    Sleep apnea    Use C-PAP; can't use mouth piece. Anxiety   Thyroid disease    nodules    Thyroid nodule    x2   Urinary urgency     Past Surgical History:  Procedure Laterality Date   ABDOMINAL HYSTERECTOMY  2011   partial   ABDOMINAL SURGERY     BREAST BIOPSY Right 10/02/2020   affirm bx, x marker, fat necrosis   BREAST BIOPSY Right 10/19/2019   two masses at 9:00 [place] bx, coil marker and venus marker, IMC for both   BREAST LUMPECTOMY Right 03/28/2020   bracketing of two IMC masses at 9:00.  Venus marker was pushed medially during wire loc. Remains in breast.  Clear surgical margins. Negative Lymph node   CARPAL TUNNEL RELEASE Left 2008   CARPAL TUNNEL RELEASE Right    CHOLECYSTECTOMY  1982   COLONOSCOPY WITH PROPOFOL N/A 10/05/2017   Procedure: COLONOSCOPY WITH PROPOFOL;  Surgeon: Scot Jun, MD;  Location: [facility] ENDOSCOPY;  Service: Endoscopy;  Laterality: N/A;   COLONOSCOPY WITH PROPOFOL N/A 08/09/2021   Procedure: COLONOSCOPY WITH PROPOFOL;  Surgeon: Jaynie Collins, DO;  Location: [facility] ENDOSCOPY;  Service: Gastroenterology;  Laterality: N/A;   DIAGNOSTIC LAPAROSCOPY     ESOPHAGOGASTRODUODENOSCOPY (EGD) WITH PROPOFOL N/A 08/09/2021   Procedure: ESOPHAGOGASTRODUODENOSCOPY (EGD) WITH PROPOFOL;  Surgeon: Elfredia Nevins  Casimiro Needle, DO;  Location: [facility] ENDOSCOPY;  Service: Gastroenterology;  Laterality: N/A;   JOINT REPLACEMENT Right 02/09/2017   TOTAL HIP REPLACEMENT, DR. CARTER. [place]   KNEE ARTHROSCOPY  1990s   PARTIAL MASTECTOMY WITH NEEDLE LOCALIZATION AND AXILLARY SENTINEL LYMPH NODE BX Right 03/28/2020   Procedure: PARTIAL MASTECTOMY WITH NEEDLE LOCALIZATION AND AXILLARY SENTINEL LYMPH NODE BX;  Surgeon: Carolan Shiver, MD;  Location: [facility] ORS;  Service: General;  Laterality: Right;   PORTACATH PLACEMENT Left 11/07/2019   Procedure: INSERTION PORT-A-CATH;  Surgeon: Carolan Shiver, MD;  Location: [facility] ORS;  Service: General;  Laterality: Left;   TOTAL HIP ARTHROPLASTY Left 06/23/2017   Procedure: TOTAL HIP ARTHROPLASTY ANTERIOR APPROACH;   Surgeon: Kennedy Bucker, MD;  Location: [facility] ORS;  Service: Orthopedics;  Laterality: Left;    Social History   Socioeconomic History   Marital status: Married    Spouse name: Not on file   Number of children: 2   Years of education: some college   Highest education level: Not on file  Occupational History   Occupation: retired    Comment: [employer]  Tobacco Use   Smoking status: Never   Smokeless tobacco: Never  Vaping Use   Vaping status: Never Used  Substance and Sexual Activity   Alcohol use: No   Drug use: No   Sexual activity: Not Currently  Other Topics Concern   Not on file  Social History Narrative   Not on file   Social Determinants of Health   Financial Resource Strain: Not on file  Food Insecurity: Not on file  Transportation Needs: Not on file  Physical Activity: Not on file  Stress: Not on file  Social Connections: Not on file  Intimate Partner Violence: Not on file    Family History  Problem Relation Age of Onset   Heart disease Mother    Hypertension Mother    Arrhythmia Mother    Heart attack Mother    Diabetes Mother    Hypertension Father    Parkinson's disease Father    Coronary artery disease Maternal Uncle    Heart attack Maternal Uncle    Diabetes Sister    Diabetes Brother    Heart disease Brother    Breast cancer Neg Hx     Allergies  Allergen Reactions   Amlodipine Other (See Comments)    edema   Statins Rash    Other reaction(s): Muscle Pain Possible hives also    Review of Systems  Constitutional:  Positive for chills and malaise/fatigue.  HENT:  Positive for congestion and sinus pain. Negative for ear discharge, ear pain, nosebleeds and sore throat.   Eyes: Negative.   Respiratory:  Positive for cough and sputum production. Negative for shortness of breath.   Cardiovascular: Negative.  Negative for chest pain, palpitations and leg swelling.  Gastrointestinal: Negative.  Negative for abdominal pain,  constipation, diarrhea, heartburn, nausea and vomiting.  Genitourinary: Negative.  Negative for dysuria and flank pain.  Musculoskeletal: Negative.  Negative for joint pain and myalgias.  Skin: Negative.   Neurological: Negative.  Negative for dizziness and headaches.  Endo/Heme/Allergies: Negative.   Psychiatric/Behavioral: Negative.  Negative for depression and suicidal ideas. The patient is not nervous/anxious.        Objective:   BP (!) 164/86   Pulse 81   Temp (!) 97.3 F (36.3 C) (Tympanic)   Ht 5\' 2"  (1.575 m)   Wt 216 lb 3.2 oz (98.1 kg)   SpO2 96%  BMI 39.54 kg/m   Vitals:   07/02/23 0927  BP: (!) 164/86  Pulse: 81  Temp: (!) 97.3 F (36.3 C)  Height: 5\' 2"  (1.575 m)  Weight: 216 lb 3.2 oz (98.1 kg)  SpO2: 96%  TempSrc: Tympanic  BMI (Calculated): 39.53    Physical Exam Vitals and nursing note reviewed.  Constitutional:      Appearance: Normal appearance.  HENT:     Head: Normocephalic and atraumatic.     Nose: Congestion and rhinorrhea present.     Mouth/Throat:     Mouth: Mucous membranes are moist.     Pharynx: Oropharynx is clear. No oropharyngeal exudate or posterior oropharyngeal erythema.  Eyes:     Conjunctiva/sclera: Conjunctivae normal.     Pupils: Pupils are equal, round, and reactive to light.  Cardiovascular:     Rate and Rhythm: Normal rate and regular rhythm.     Pulses: Normal pulses.     Heart sounds: Normal heart sounds. No murmur heard. Pulmonary:     Effort: Pulmonary effort is normal.     Breath sounds: Normal breath sounds. No wheezing.  Abdominal:     General: Bowel sounds are normal.     Palpations: Abdomen is soft.     Tenderness: There is no abdominal tenderness. There is no right CVA tenderness or left CVA tenderness.  Musculoskeletal:        General: Normal range of motion.     Cervical back: Normal range of motion.     Right lower leg: No edema.     Left lower leg: No edema.  Skin:    General: Skin is warm and  dry.  Neurological:     General: No focal deficit present.     Mental Status: She is alert and oriented to person, place, and time.  Psychiatric:        Mood and Affect: Mood normal.        Behavior: Behavior normal.     Results for orders placed or performed in visit on 07/02/23  POCT CBG (Fasting - Glucose)  Result Value Ref Range   Glucose Fasting, POC 111 (A) 70 - 99 mg/dL        Assessment & Plan:  Increase her hydralazine to 50 mg twice a day.  Monitor blood pressure at home.  Problem List Items Addressed This Visit     Controlled type 2 diabetes mellitus with complication, without long-term current use of insulin (HCC) - Primary   Relevant Orders   POCT CBG (Fasting - Glucose) (Completed)   Mixed hyperlipidemia   Relevant Medications   ezetimibe (ZETIA) 10 MG tablet   Other Visit Diagnoses     URI, acute       Relevant Medications   amoxicillin-clavulanate (AUGMENTIN) 500-125 MG tablet       Return in about 2 weeks (around 07/16/2023).   Total time spent: 30 minutes  Margaretann Loveless, MD  07/02/2023   This document may have been prepared by [facility] Voice Recognition software and as such may include unintentional dictation errors.

## 2023-07-16 ENCOUNTER — Ambulatory Visit: Payer: TRICARE For Life (TFL) | Admitting: Radiation Oncology

## 2023-07-16 ENCOUNTER — Ambulatory Visit: Payer: Medicare PPO | Admitting: Internal Medicine

## 2023-07-16 ENCOUNTER — Telehealth: Payer: Self-pay

## 2023-07-16 ENCOUNTER — Encounter: Payer: Self-pay | Admitting: Internal Medicine

## 2023-07-16 ENCOUNTER — Ambulatory Visit (INDEPENDENT_AMBULATORY_CARE_PROVIDER_SITE_OTHER): Payer: Medicare PPO | Admitting: Internal Medicine

## 2023-07-16 VITALS — BP 135/70 | HR 77 | Ht 62.0 in | Wt 218.6 lb

## 2023-07-16 DIAGNOSIS — E782 Mixed hyperlipidemia: Secondary | ICD-10-CM | POA: Diagnosis not present

## 2023-07-16 DIAGNOSIS — E118 Type 2 diabetes mellitus with unspecified complications: Secondary | ICD-10-CM

## 2023-07-16 DIAGNOSIS — R059 Cough, unspecified: Secondary | ICD-10-CM

## 2023-07-16 DIAGNOSIS — E559 Vitamin D deficiency, unspecified: Secondary | ICD-10-CM | POA: Diagnosis not present

## 2023-07-16 DIAGNOSIS — R051 Acute cough: Secondary | ICD-10-CM

## 2023-07-16 DIAGNOSIS — D508 Other iron deficiency anemias: Secondary | ICD-10-CM | POA: Diagnosis not present

## 2023-07-16 DIAGNOSIS — I10 Essential (primary) hypertension: Secondary | ICD-10-CM

## 2023-07-16 LAB — POCT XPERT XPRESS SARS COVID-2/FLU/RSV
FLU A: NEGATIVE
FLU B: NEGATIVE
RSV RNA, PCR: NEGATIVE
SARS Coronavirus 2: NEGATIVE

## 2023-07-16 LAB — POCT CBG (FASTING - GLUCOSE)-MANUAL ENTRY: Glucose Fasting, POC: 140 mg/dL — AB (ref 70–99)

## 2023-07-16 MED ORDER — AZITHROMYCIN 250 MG PO TABS
ORAL_TABLET | ORAL | 0 refills | Status: AC
Start: 2023-07-16 — End: 2023-07-21

## 2023-07-16 NOTE — Progress Notes (Signed)
Established Patient Office Visit  Subjective:  Patient ID: Christina Mcclain, female    DOB: 1956/01/26  Age: 67 y.o. MRN: [id number]  Chief Complaint  Patient presents with   Follow-up    2 WEEK F/U    Patient comes in for follow-up.  Her blood pressure looks much better now with hydralazine twice a day.  She had completed a course of Augmentin which was prescribed earlier this month and until recently.  She has a loud barking cough, no fevers or chills but feels tired. Her COVID test done in the office is negative. Will send in a Z-Pak.    No other concerns at this time.   Past Medical History:  Diagnosis Date   Adverse effect of anesthesia    hard to awaken   Anemia    Arthritis    Asthma    Asthma without status asthmaticus    unspecified; Take advair each night   Breast cancer (HCC) 2021   right breast IMC   Bronchitis    Chronic kidney disease    stage III per dr Cherylann Ratel   Constipation    Diabetes (HCC) 2010   Diabetes mellitus type 2, uncomplicated (HCC)    Diabetes mellitus without complication (HCC)    Encounter for blood transfusion    hysterectomy 9-10 years ago   Heart murmur    unspecified   Hyperlipidemia    Hypertension 1991   Hypothyroid    Multiple thyroid nodules    Neuromuscular disorder (HCC)    neuropathy   Osteoarthritis of right hip 02/04/2017   Palpitations    Pernicious anemia    Personal history of chemotherapy 2020-2021   Right breast ca   Personal history of radiation therapy 2021   right breast [facility]   Pneumonia    Sleep apnea    Use C-PAP; can't use mouth piece. Anxiety   Thyroid disease    nodules   Thyroid nodule    x2   Urinary urgency     Past Surgical History:  Procedure Laterality Date   ABDOMINAL HYSTERECTOMY  2011   partial   ABDOMINAL SURGERY     BREAST BIOPSY Right 10/02/2020   affirm bx, x marker, fat necrosis   BREAST BIOPSY Right 10/19/2019   two masses at 9:00 [place] bx, coil marker and venus marker, IMC  for both   BREAST LUMPECTOMY Right 03/28/2020   bracketing of two IMC masses at 9:00.  Venus marker was pushed medially during wire loc. Remains in breast.  Clear surgical margins. Negative Lymph node   CARPAL TUNNEL RELEASE Left 2008   CARPAL TUNNEL RELEASE Right    CHOLECYSTECTOMY  1982   COLONOSCOPY WITH PROPOFOL N/A 10/05/2017   Procedure: COLONOSCOPY WITH PROPOFOL;  Surgeon: Scot Jun, MD;  Location: [facility] ENDOSCOPY;  Service: Endoscopy;  Laterality: N/A;   COLONOSCOPY WITH PROPOFOL N/A 08/09/2021   Procedure: COLONOSCOPY WITH PROPOFOL;  Surgeon: Jaynie Collins, DO;  Location: [facility] ENDOSCOPY;  Service: Gastroenterology;  Laterality: N/A;   DIAGNOSTIC LAPAROSCOPY     ESOPHAGOGASTRODUODENOSCOPY (EGD) WITH PROPOFOL N/A 08/09/2021   Procedure: ESOPHAGOGASTRODUODENOSCOPY (EGD) WITH PROPOFOL;  Surgeon: Jaynie Collins, DO;  Location: [facility] ENDOSCOPY;  Service: Gastroenterology;  Laterality: N/A;   JOINT REPLACEMENT Right 02/09/2017   TOTAL HIP REPLACEMENT, DR. CARTER. [place]   KNEE ARTHROSCOPY  1990s   PARTIAL MASTECTOMY WITH NEEDLE LOCALIZATION AND AXILLARY SENTINEL LYMPH NODE BX Right 03/28/2020   Procedure: PARTIAL MASTECTOMY WITH NEEDLE LOCALIZATION AND AXILLARY SENTINEL LYMPH  NODE BX;  Surgeon: Carolan Shiver, MD;  Location: [facility] ORS;  Service: General;  Laterality: Right;   PORTACATH PLACEMENT Left 11/07/2019   Procedure: INSERTION PORT-A-CATH;  Surgeon: Carolan Shiver, MD;  Location: [facility] ORS;  Service: General;  Laterality: Left;   TOTAL HIP ARTHROPLASTY Left 06/23/2017   Procedure: TOTAL HIP ARTHROPLASTY ANTERIOR APPROACH;  Surgeon: Kennedy Bucker, MD;  Location: [facility] ORS;  Service: Orthopedics;  Laterality: Left;    Social History   Socioeconomic History   Marital status: Married    Spouse name: Not on file   Number of children: 2   Years of education: some college   Highest education level: Not on file  Occupational History   Occupation: retired     Comment: [employer]  Tobacco Use   Smoking status: Never   Smokeless tobacco: Never  Vaping Use   Vaping status: Never Used  Substance and Sexual Activity   Alcohol use: No   Drug use: No   Sexual activity: Not Currently  Other Topics Concern   Not on file  Social History Narrative   Not on file   Social Determinants of Health   Financial Resource Strain: Not on file  Food Insecurity: Not on file  Transportation Needs: Not on file  Physical Activity: Not on file  Stress: Not on file  Social Connections: Not on file  Intimate Partner Violence: Not on file    Family History  Problem Relation Age of Onset   Heart disease Mother    Hypertension Mother    Arrhythmia Mother    Heart attack Mother    Diabetes Mother    Hypertension Father    Parkinson's disease Father    Coronary artery disease Maternal Uncle    Heart attack Maternal Uncle    Diabetes Sister    Diabetes Brother    Heart disease Brother    Breast cancer Neg Hx     Allergies  Allergen Reactions   Amlodipine Other (See Comments)    edema   Statins Rash    Other reaction(s): Muscle Pain Possible hives also    Review of Systems  Constitutional:  Positive for malaise/fatigue. Negative for chills, diaphoresis, fever and weight loss.  HENT: Negative.  Negative for congestion, ear pain, sinus pain and sore throat.   Eyes: Negative.   Respiratory:  Positive for cough and sputum production. Negative for shortness of breath, wheezing and stridor.   Cardiovascular: Negative.  Negative for chest pain, palpitations and leg swelling.  Gastrointestinal: Negative.  Negative for abdominal pain, constipation, diarrhea, heartburn, nausea and vomiting.  Genitourinary: Negative.  Negative for dysuria and flank pain.  Musculoskeletal: Negative.  Negative for joint pain and myalgias.  Skin: Negative.   Neurological: Negative.  Negative for dizziness and headaches.  Endo/Heme/Allergies: Negative.    Psychiatric/Behavioral: Negative.  Negative for depression and suicidal ideas. The patient is not nervous/anxious.        Objective:   BP 135/70   Pulse 77   Ht 5\' 2"  (1.575 m)   Wt 218 lb 9.6 oz (99.2 kg)   SpO2 98%   BMI 39.98 kg/m   Vitals:   07/16/23 1051  BP: 135/70  Pulse: 77  Height: 5\' 2"  (1.575 m)  Weight: 218 lb 9.6 oz (99.2 kg)  SpO2: 98%  BMI (Calculated): 39.97    Physical Exam Vitals and nursing note reviewed.  Constitutional:      General: She is not in acute distress.    Appearance:  Normal appearance.  HENT:     Head: Normocephalic and atraumatic.     Nose: Nose normal.     Mouth/Throat:     Mouth: Mucous membranes are moist.     Pharynx: Oropharynx is clear.  Eyes:     Conjunctiva/sclera: Conjunctivae normal.     Pupils: Pupils are equal, round, and reactive to light.  Cardiovascular:     Rate and Rhythm: Normal rate and regular rhythm.     Pulses: Normal pulses.     Heart sounds: Normal heart sounds. No murmur heard. Pulmonary:     Effort: Pulmonary effort is normal.     Breath sounds: No wheezing, rhonchi or rales.  Chest:     Chest wall: No tenderness.  Abdominal:     General: Bowel sounds are normal.     Palpations: Abdomen is soft.     Tenderness: There is no abdominal tenderness. There is no right CVA tenderness or left CVA tenderness.  Musculoskeletal:        General: Normal range of motion.     Cervical back: Normal range of motion.     Right lower leg: No edema.     Left lower leg: No edema.  Skin:    General: Skin is warm and dry.  Neurological:     General: No focal deficit present.     Mental Status: She is alert and oriented to person, place, and time.  Psychiatric:        Mood and Affect: Mood normal.        Behavior: Behavior normal.      Results for orders placed or performed in visit on 07/16/23  POCT CBG (Fasting - Glucose)  Result Value Ref Range   Glucose Fasting, POC 140 (A) 70 - 99 mg/dL  POCT XPERT  XPRESS SARS COVID-2/FLU/RSV [[id number]]  Result Value Ref Range   SARS Coronavirus 2 Neg    FLU A Neg    FLU B Neg    RSV RNA, PCR Neg         Assessment & Plan:  Patient will return for her labs. Needs to rest fluids, cough medicine and Z-Pak. Continue other medications and monitor blood pressure at home Problem List Items Addressed This Visit     Controlled type 2 diabetes mellitus with complication, without long-term current use of insulin (HCC) - Primary   Relevant Orders   POCT CBG (Fasting - Glucose) (Completed)   Hemoglobin A1c   Essential hypertension, benign   Relevant Orders   CMP14+EGFR   Absolute anemia   Relevant Orders   CBC with Diff   Mixed hyperlipidemia   Relevant Orders   Lipid Panel w/o Chol/HDL Ratio   Other Visit Diagnoses     Cough, unspecified type       Relevant Medications   azithromycin (ZITHROMAX) 250 MG tablet   Other Relevant Orders   POCT XPERT XPRESS SARS COVID-2/FLU/RSV [[id number]] (Completed)   Vitamin D deficiency       Relevant Orders   Vitamin D (25 hydroxy)       Return in about 3 months (around 10/16/2023).   Total time spent: 30 minutes  Margaretann Loveless, MD  07/16/2023   This document may have been prepared by [facility] Voice Recognition software and as such may include unintentional dictation errors.

## 2023-07-16 NOTE — Telephone Encounter (Signed)
Pt has been informed about Neg COVID test.

## 2023-07-17 ENCOUNTER — Other Ambulatory Visit: Payer: Self-pay | Admitting: Oncology

## 2023-07-17 ENCOUNTER — Other Ambulatory Visit: Payer: Self-pay | Admitting: Internal Medicine

## 2023-07-17 DIAGNOSIS — E782 Mixed hyperlipidemia: Secondary | ICD-10-CM

## 2023-07-20 ENCOUNTER — Other Ambulatory Visit: Payer: Self-pay | Admitting: Internal Medicine

## 2023-07-20 ENCOUNTER — Telehealth: Payer: Self-pay | Admitting: Internal Medicine

## 2023-07-20 DIAGNOSIS — R052 Subacute cough: Secondary | ICD-10-CM

## 2023-07-20 NOTE — Telephone Encounter (Signed)
Patient left VM that she is still coughing up mucus and now her cough has turned into a "croupy cough". She wants to know does she need to be seen again, should we change her medication, or what should she do? Please advise.

## 2023-07-21 ENCOUNTER — Encounter: Payer: Self-pay | Admitting: Oncology

## 2023-07-21 ENCOUNTER — Ambulatory Visit
Admission: RE | Admit: 2023-07-21 | Discharge: 2023-07-21 | Disposition: A | Discharge status: Home / Self Care | Payer: Medicare PPO | Source: Ambulatory Visit | Attending: Internal Medicine | Admitting: Internal Medicine

## 2023-07-21 ENCOUNTER — Ambulatory Visit
Admission: RE | Admit: 2023-07-21 | Discharge: 2023-07-21 | Disposition: A | Discharge status: Home / Self Care | Payer: Medicare PPO | Attending: Internal Medicine | Admitting: Internal Medicine

## 2023-07-21 DIAGNOSIS — I517 Cardiomegaly: Secondary | ICD-10-CM | POA: Diagnosis not present

## 2023-07-21 DIAGNOSIS — R058 Other specified cough: Secondary | ICD-10-CM | POA: Diagnosis not present

## 2023-07-21 DIAGNOSIS — R052 Subacute cough: Secondary | ICD-10-CM | POA: Diagnosis not present

## 2023-07-22 ENCOUNTER — Telehealth: Payer: Self-pay | Admitting: Internal Medicine

## 2023-07-22 NOTE — Telephone Encounter (Signed)
Patient called wanting her x-ray results from this morning. Please advise.

## 2023-07-23 NOTE — Progress Notes (Signed)
Patient notified

## 2023-07-24 ENCOUNTER — Ambulatory Visit (INDEPENDENT_AMBULATORY_CARE_PROVIDER_SITE_OTHER): Payer: Medicare PPO | Admitting: Cardiovascular Disease

## 2023-07-24 ENCOUNTER — Encounter: Payer: Self-pay | Admitting: Cardiovascular Disease

## 2023-07-24 VITALS — BP 132/77 | HR 96 | Ht 62.0 in | Wt 219.4 lb

## 2023-07-24 DIAGNOSIS — R0602 Shortness of breath: Secondary | ICD-10-CM

## 2023-07-24 DIAGNOSIS — R0789 Other chest pain: Secondary | ICD-10-CM

## 2023-07-24 DIAGNOSIS — I10 Essential (primary) hypertension: Secondary | ICD-10-CM | POA: Diagnosis not present

## 2023-07-24 DIAGNOSIS — G4733 Obstructive sleep apnea (adult) (pediatric): Secondary | ICD-10-CM

## 2023-07-24 DIAGNOSIS — E782 Mixed hyperlipidemia: Secondary | ICD-10-CM

## 2023-07-24 NOTE — Progress Notes (Signed)
Cardiology Office Note   Date:  07/24/2023   ID:  Wilbert Monter, DOB Feb 01, 1956, MRN [id number]  PCP:  Margaretann Loveless, MD  Cardiologist:  Adrian Blackwater, MD      History of Present Illness: Christina Mcclain is a 67 y.o. female who presents for  Chief Complaint  Patient presents with   Follow-up    Have a cold, and still not better,after z-pack, and augmentin  Shortness of Breath This is a new problem. The current episode started 1 to 4 weeks ago. The problem occurs every few minutes. The problem has been resolved.      Past Medical History:  Diagnosis Date   Adverse effect of anesthesia    hard to awaken   Anemia    Arthritis    Asthma    Asthma without status asthmaticus    unspecified; Take advair each night   Breast cancer (HCC) 2021   right breast IMC   Bronchitis    Chronic kidney disease    stage III per dr Cherylann Ratel   Constipation    Diabetes (HCC) 2010   Diabetes mellitus type 2, uncomplicated (HCC)    Diabetes mellitus without complication (HCC)    Encounter for blood transfusion    hysterectomy 9-10 years ago   Heart murmur    unspecified   Hyperlipidemia    Hypertension 1991   Hypothyroid    Multiple thyroid nodules    Neuromuscular disorder (HCC)    neuropathy   Osteoarthritis of right hip 02/04/2017   Palpitations    Pernicious anemia    Personal history of chemotherapy 2020-2021   Right breast ca   Personal history of radiation therapy 2021   right breast [facility]   Pneumonia    Sleep apnea    Use C-PAP; can't use mouth piece. Anxiety   Thyroid disease    nodules   Thyroid nodule    x2   Urinary urgency      Past Surgical History:  Procedure Laterality Date   ABDOMINAL HYSTERECTOMY  2011   partial   ABDOMINAL SURGERY     BREAST BIOPSY Right 10/02/2020   affirm bx, x marker, fat necrosis   BREAST BIOPSY Right 10/19/2019   two masses at 9:00 [place] bx, coil marker and venus marker, IMC for both   BREAST LUMPECTOMY Right 03/28/2020    bracketing of two IMC masses at 9:00.  Venus marker was pushed medially during wire loc. Remains in breast.  Clear surgical margins. Negative Lymph node   CARPAL TUNNEL RELEASE Left 2008   CARPAL TUNNEL RELEASE Right    CHOLECYSTECTOMY  1982   COLONOSCOPY WITH PROPOFOL N/A 10/05/2017   Procedure: COLONOSCOPY WITH PROPOFOL;  Surgeon: Scot Jun, MD;  Location: [facility] ENDOSCOPY;  Service: Endoscopy;  Laterality: N/A;   COLONOSCOPY WITH PROPOFOL N/A 08/09/2021   Procedure: COLONOSCOPY WITH PROPOFOL;  Surgeon: Jaynie Collins, DO;  Location: [facility] ENDOSCOPY;  Service: Gastroenterology;  Laterality: N/A;   DIAGNOSTIC LAPAROSCOPY     ESOPHAGOGASTRODUODENOSCOPY (EGD) WITH PROPOFOL N/A 08/09/2021   Procedure: ESOPHAGOGASTRODUODENOSCOPY (EGD) WITH PROPOFOL;  Surgeon: Jaynie Collins, DO;  Location: [facility] ENDOSCOPY;  Service: Gastroenterology;  Laterality: N/A;   JOINT REPLACEMENT Right 02/09/2017   TOTAL HIP REPLACEMENT, DR. CARTER. [place]   KNEE ARTHROSCOPY  1990s   PARTIAL MASTECTOMY WITH NEEDLE LOCALIZATION AND AXILLARY SENTINEL LYMPH NODE BX Right 03/28/2020   Procedure: PARTIAL MASTECTOMY WITH NEEDLE LOCALIZATION AND AXILLARY SENTINEL LYMPH NODE BX;  Surgeon: Carolan Shiver, MD;  Location: [facility] ORS;  Service: General;  Laterality: Right;   PORTACATH PLACEMENT Left 11/07/2019   Procedure: INSERTION PORT-A-CATH;  Surgeon: Carolan Shiver, MD;  Location: [facility] ORS;  Service: General;  Laterality: Left;   TOTAL HIP ARTHROPLASTY Left 06/23/2017   Procedure: TOTAL HIP ARTHROPLASTY ANTERIOR APPROACH;  Surgeon: Kennedy Bucker, MD;  Location: [facility] ORS;  Service: Orthopedics;  Laterality: Left;     Current Outpatient Medications  Medication Sig Dispense Refill   acetaminophen (TYLENOL) 500 MG tablet Take 500 mg by mouth every 6 (six) hours as needed.     albuterol (PROVENTIL HFA;VENTOLIN HFA) 108 (90 Base) MCG/ACT inhaler Inhale 2 puffs into the lungs every 6 (six) hours as  needed for wheezing or shortness of breath.     Ascorbic Acid (VITAMIN C) 1000 MG tablet Take 2 tablets by mouth daily.     CALCIUM CITRATE PO Take 1,000 mg by mouth daily.     carvedilol (COREG) 25 MG tablet TAKE 1 TABLET BY MOUTH TWICE DAILY 180 tablet 3   cholecalciferol (VITAMIN D) 1000 units tablet Take 2,000 Units by mouth daily.  (Patient not taking: Reported on 04/09/2023)     Cholecalciferol (VITAMIN D-3) 125 MCG (5000 UT) TABS Take 2 tablets by mouth daily.     ezetimibe (ZETIA) 10 MG tablet TAKE 1 TABLET BY MOUTH EVERY DAY 90 tablet 3   Fluticasone-Salmeterol (ADVAIR) 250-50 MCG/DOSE AEPB Inhale 1 puff into the lungs 2 (two) times daily.      fluticasone-salmeterol (WIXELA INHUB) 250-50 MCG/ACT AEPB Inhale 1 puff into the lungs in the morning and at bedtime.     furosemide (LASIX) 20 MG tablet      hydrALAZINE (APRESOLINE) 25 MG tablet Take 1 tablet (25 mg total) by mouth 2 times daily at 12 noon and 4 pm. 180 tablet 3   letrozole (FEMARA) 2.5 MG tablet TAKE 1 TABLET(2.5 MG) BY MOUTH DAILY 90 tablet 2   lidocaine-prilocaine (EMLA) cream APPLY 1 APPLICATION TOPICALLY AS NEEDED (Patient not taking: Reported on 04/09/2023) 30 g 1   loratadine (CLARITIN) 10 MG tablet Take 10 mg by mouth daily as needed for allergies.      losartan (COZAAR) 25 MG tablet      magnesium oxide (MAG-OX) 400 MG tablet Take 2 tablets by mouth daily.     meloxicam (MOBIC) 15 MG tablet Take 15 mg by mouth daily. (Patient not taking: Reported on 07/02/2023)     pantoprazole (PROTONIX) 40 MG tablet Take 40 mg by mouth daily.     pregabalin (LYRICA) 50 MG capsule TAKE ONE CAPSULE BY MOUTH TWICE DAILY 180 capsule 2   solifenacin (VESICARE) 10 MG tablet Take 10 mg by mouth every evening.     traMADol (ULTRAM) 50 MG tablet Take 1 tablet (50 mg total) by mouth every 6 (six) hours as needed. (Patient not taking: Reported on 04/09/2023) 30 tablet 0   No current facility-administered medications for this visit.    Allergies:    Amlodipine and Statins    Social History:   reports that she has never smoked. She has never used smokeless tobacco. She reports that she does not drink alcohol and does not use drugs.   Family History:  family history includes Arrhythmia in her mother; Coronary artery disease in her maternal uncle; Diabetes in her brother, mother, and sister; Heart attack in her maternal uncle and mother; Heart disease in her brother and mother; Hypertension in her father and mother; Parkinson's disease in her father.  ROS:     Review of Systems  Constitutional: Negative.   HENT: Negative.    Eyes: Negative.   Respiratory:  Positive for shortness of breath.   Gastrointestinal: Negative.   Genitourinary: Negative.   Musculoskeletal: Negative.   Skin: Negative.   Neurological: Negative.   Endo/Heme/Allergies: Negative.   Psychiatric/Behavioral: Negative.    All other systems reviewed and are negative.     All other systems are reviewed and negative.    PHYSICAL EXAM: VS:  BP 132/77   Pulse 96   Ht 5\' 2"  (1.575 m)   Wt 219 lb 6.4 oz (99.5 kg)   SpO2 98%   BMI 40.13 kg/m  , BMI Body mass index is 40.13 kg/m. Last weight:  Wt Readings from Last 3 Encounters:  07/24/23 219 lb 6.4 oz (99.5 kg)  07/16/23 218 lb 9.6 oz (99.2 kg)  07/02/23 216 lb 3.2 oz (98.1 kg)     Physical Exam Constitutional:      Appearance: Normal appearance.  Cardiovascular:     Rate and Rhythm: Normal rate and regular rhythm.     Heart sounds: Normal heart sounds.  Pulmonary:     Effort: Pulmonary effort is normal.     Breath sounds: Normal breath sounds.  Musculoskeletal:     Right lower leg: No edema.     Left lower leg: No edema.  Neurological:     Mental Status: She is alert.       EKG:   Recent Labs: 01/23/2023: ALT 13 03/10/2023: BUN 27; Creatinine, Ser 1.45; Potassium 4.6; Sodium 144 04/23/2023: Hemoglobin 10.5; Platelets 174    Lipid Panel No results found for: "CHOL", "TRIG", "HDL",  "CHOLHDL", "VLDL", "LDLCALC", "LDLDIRECT"    Other studies Reviewed: Additional studies/ records that were reviewed today include:  Review of the above records demonstrates:       No data to display            ASSESSMENT AND PLAN:    ICD-10-CM   1. SOB (shortness of breath)  R06.02 PCV ECHOCARDIOGRAM COMPLETE   Repeat echo, as can be CHF    2. Essential hypertension, benign  I10 PCV ECHOCARDIOGRAM COMPLETE    3. Obstructive sleep apnea syndrome  G47.33 PCV ECHOCARDIOGRAM COMPLETE    4. Mixed hyperlipidemia  E78.2 PCV ECHOCARDIOGRAM COMPLETE    5. Chest pain, non-cardiac  R07.89 PCV ECHOCARDIOGRAM COMPLETE   Had Normal Coronaries on ccta 3 month back       Problem List Items Addressed This Visit       Cardiovascular and Mediastinum   Essential hypertension, benign   Relevant Orders   PCV ECHOCARDIOGRAM COMPLETE     Respiratory   Obstructive sleep apnea syndrome   Relevant Orders   PCV ECHOCARDIOGRAM COMPLETE     Other   Mixed hyperlipidemia   Relevant Orders   PCV ECHOCARDIOGRAM COMPLETE   Other Visit Diagnoses     SOB (shortness of breath)    -  Primary   Repeat echo, as can be CHF   Relevant Orders   PCV ECHOCARDIOGRAM COMPLETE   Chest pain, non-cardiac       Had Normal Coronaries on ccta 3 month back   Relevant Orders   PCV ECHOCARDIOGRAM COMPLETE          Disposition:   Return in about 2 weeks (around 08/07/2023) for echo and f/u.    Total time spent: 35 minutes  Signed,  Adrian Blackwater, MD  07/24/2023 10:32 AM  [facility]

## 2023-07-28 ENCOUNTER — Encounter: Payer: Self-pay | Admitting: Oncology

## 2023-07-28 ENCOUNTER — Inpatient Hospital Stay: Payer: Medicare PPO | Attending: Oncology | Admitting: Oncology

## 2023-07-28 VITALS — BP 163/62 | HR 61 | Temp 96.5°F | Resp 18 | Ht 62.0 in | Wt 218.3 lb

## 2023-07-28 DIAGNOSIS — Z08 Encounter for follow-up examination after completed treatment for malignant neoplasm: Secondary | ICD-10-CM | POA: Diagnosis not present

## 2023-07-28 DIAGNOSIS — C50911 Malignant neoplasm of unspecified site of right female breast: Secondary | ICD-10-CM | POA: Diagnosis not present

## 2023-07-28 DIAGNOSIS — M25562 Pain in left knee: Secondary | ICD-10-CM | POA: Diagnosis not present

## 2023-07-28 DIAGNOSIS — Z853 Personal history of malignant neoplasm of breast: Secondary | ICD-10-CM

## 2023-07-28 DIAGNOSIS — Z170 Estrogen receptor positive status [ER+]: Secondary | ICD-10-CM | POA: Insufficient documentation

## 2023-07-28 DIAGNOSIS — M25561 Pain in right knee: Secondary | ICD-10-CM | POA: Diagnosis not present

## 2023-07-28 DIAGNOSIS — Z79899 Other long term (current) drug therapy: Secondary | ICD-10-CM | POA: Diagnosis not present

## 2023-07-28 DIAGNOSIS — Z79811 Long term (current) use of aromatase inhibitors: Secondary | ICD-10-CM | POA: Diagnosis not present

## 2023-07-29 ENCOUNTER — Telehealth: Payer: Self-pay | Admitting: Internal Medicine

## 2023-07-29 DIAGNOSIS — L218 Other seborrheic dermatitis: Secondary | ICD-10-CM | POA: Diagnosis not present

## 2023-07-29 MED ORDER — METHYLPREDNISOLONE 4 MG PO TBPK: ORAL_TABLET | ORAL | 0 refills | Status: DC

## 2023-07-29 NOTE — Telephone Encounter (Signed)
Patient called in c/o cough and sneezing for 5 weeks. Has been through 2 abtibiotics, tried Mucinex and says the congestion in her throat will not break up. States she feels like she can hear the congestion in her chest breaking up but nothing is coming up. Also c/o ear fullness. Please advise.

## 2023-08-01 ENCOUNTER — Encounter: Payer: Self-pay | Admitting: Oncology

## 2023-08-01 NOTE — Progress Notes (Signed)
Hematology/Oncology Consult note [facility]  [phone] [phone]  Patient Care Team: Margaretann Loveless, MD as PCP - General (Internal Medicine) Scarlett Presto, RN (Inactive) as Registered Nurse (Oncology) Creig Hines, MD as Consulting Physician (Hematology and Oncology) Creig Hines, MD as Consulting Physician (Hematology and Oncology) Creig Hines, MD as Consulting Physician (Hematology and Oncology) Carmina Miller, MD as Consulting Physician (Radiation Oncology) Carolan Shiver, MD as Consulting Physician (General Surgery)   Name of the patient: Christina Mcclain  [id number]  Apr 05, 1956   Date of visit: 08/01/23  Diagnosis- 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 It showed 2 suspicious lesions in the right breast. Diagnostic mammogram and ultrasound confirmed 2 masses in the 9 o'clock position of the right breast measuring 1.1 x 1.2 x 1.2 cm and the other one measuring 0.8 x 0.8 x 0.9 cm. These 2 masses were 1.7 cm apart. Sonographic evaluation of the right axilla did not show any enlarged adenopathy. 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    Baseline MUGA scan showed EF of 58%.  MRI showed two distinct masses in the right breast 1.5 cm and 1.2 cm 2 cm apart no additional suspicious masses or abnormal enhancement identified.  No suspicious adenopathy   6 cycles of neoadjuvant TCHP chemotherapy completed on 02/29/2020.    Final pathology showed residual tumor of 5 mm.Negative margins.  1 sentinel lymph  node negative for malignancy.  Overall cancer cellularity 1%.  Percentage of cancer that is in situ 0%.  [id number]   Patient is started on adjuvant Kadcyla on 04/26/2020.  Treatment complicated by thrombocytopenia despite dose reduction. Patient switched to adjuvant Herceptin. Perjeta not continued due to significant diarrhea in the neoadjuvant setting.  Patient completed adjuvant Herceptin in February 2022.  She started letrozole sometime in July 2021.    Interval history- denies any breast concerns. She has chronic b/l knee pain and chronic fatigue that has remained unchanged  ECOG PS- 1 Pain scale- 2   Review of systems- Review of Systems  Constitutional:  Positive for malaise/fatigue. Negative for chills, fever and weight loss.  HENT:  Negative for congestion, ear discharge and nosebleeds.   Eyes:  Negative for blurred vision.  Respiratory:  Negative for cough, hemoptysis, sputum production, shortness of breath and wheezing.   Cardiovascular:  Negative for chest pain, palpitations, orthopnea and claudication.  Gastrointestinal:  Negative for abdominal pain, blood in stool, constipation, diarrhea, heartburn, melena, nausea and vomiting.  Genitourinary:  Negative for dysuria, flank pain, frequency, hematuria and urgency.  Musculoskeletal:  Positive for joint pain. Negative for back pain and myalgias.  Skin:  Negative for rash.  Neurological:  Negative for dizziness, tingling, focal weakness, seizures, weakness and headaches.  Endo/Heme/Allergies:  Does not bruise/bleed easily.  Psychiatric/Behavioral:  Negative for depression and suicidal ideas. The patient does not have insomnia.       Allergies  Allergen Reactions   Amlodipine Other (See Comments)    edema   Statins Rash    Other reaction(s): Muscle Pain Possible hives also  Past Medical History:  Diagnosis Date   Adverse effect of anesthesia    hard to awaken   Anemia    Arthritis    Asthma    Asthma without status  asthmaticus    unspecified; Take advair each night   Breast cancer (HCC) 2021   right breast IMC   Bronchitis    Chronic kidney disease    stage III per dr Cherylann Ratel   Constipation    Diabetes (HCC) 2010   Diabetes mellitus type 2, uncomplicated (HCC)    Diabetes mellitus without complication (HCC)    Encounter for blood transfusion    hysterectomy 9-10 years ago   Heart murmur    unspecified   Hyperlipidemia    Hypertension 1991   Hypothyroid    Multiple thyroid nodules    Neuromuscular disorder (HCC)    neuropathy   Osteoarthritis of right hip 02/04/2017   Palpitations    Pernicious anemia    Personal history of chemotherapy 2020-2021   Right breast ca   Personal history of radiation therapy 2021   right breast [facility]   Pneumonia    Sleep apnea    Use C-PAP; can't use mouth piece. Anxiety   Thyroid disease    nodules   Thyroid nodule    x2   Urinary urgency      Past Surgical History:  Procedure Laterality Date   ABDOMINAL HYSTERECTOMY  2011   partial   ABDOMINAL SURGERY     BREAST BIOPSY Right 10/02/2020   affirm bx, x marker, fat necrosis   BREAST BIOPSY Right 10/19/2019   two masses at 9:00 [place] bx, coil marker and venus marker, IMC for both   BREAST LUMPECTOMY Right 03/28/2020   bracketing of two IMC masses at 9:00.  Venus marker was pushed medially during wire loc. Remains in breast.  Clear surgical margins. Negative Lymph node   CARPAL TUNNEL RELEASE Left 2008   CARPAL TUNNEL RELEASE Right    CHOLECYSTECTOMY  1982   COLONOSCOPY WITH PROPOFOL N/A 10/05/2017   Procedure: COLONOSCOPY WITH PROPOFOL;  Surgeon: Scot Jun, MD;  Location: [facility] ENDOSCOPY;  Service: Endoscopy;  Laterality: N/A;   COLONOSCOPY WITH PROPOFOL N/A 08/09/2021   Procedure: COLONOSCOPY WITH PROPOFOL;  Surgeon: Jaynie Collins, DO;  Location: [facility] ENDOSCOPY;  Service: Gastroenterology;  Laterality: N/A;   DIAGNOSTIC LAPAROSCOPY     ESOPHAGOGASTRODUODENOSCOPY (EGD) WITH PROPOFOL N/A  08/09/2021   Procedure: ESOPHAGOGASTRODUODENOSCOPY (EGD) WITH PROPOFOL;  Surgeon: Jaynie Collins, DO;  Location: [facility] ENDOSCOPY;  Service: Gastroenterology;  Laterality: N/A;   JOINT REPLACEMENT Right 02/09/2017   TOTAL HIP REPLACEMENT, DR. CARTER. [place]   KNEE ARTHROSCOPY  1990s   PARTIAL MASTECTOMY WITH NEEDLE LOCALIZATION AND AXILLARY SENTINEL LYMPH NODE BX Right 03/28/2020   Procedure: PARTIAL MASTECTOMY WITH NEEDLE LOCALIZATION AND AXILLARY SENTINEL LYMPH NODE BX;  Surgeon: Carolan Shiver, MD;  Location: [facility] ORS;  Service: General;  Laterality: Right;   PORTACATH PLACEMENT Left 11/07/2019   Procedure: INSERTION PORT-A-CATH;  Surgeon: Carolan Shiver, MD;  Location: [facility] ORS;  Service: General;  Laterality: Left;   TOTAL HIP ARTHROPLASTY Left 06/23/2017   Procedure: TOTAL HIP ARTHROPLASTY ANTERIOR APPROACH;  Surgeon: Kennedy Bucker, MD;  Location: [facility] ORS;  Service: Orthopedics;  Laterality: Left;    Social History   Socioeconomic History   Marital status: Married    Spouse name: Not on file   Number of children: 2   Years of education: some college   Highest education level: Not on  file  Occupational History   Occupation: retired    Comment: [employer]  Tobacco Use   Smoking status: Never   Smokeless tobacco: Never  Vaping Use   Vaping status: Never Used  Substance and Sexual Activity   Alcohol use: No   Drug use: No   Sexual activity: Not Currently  Other Topics Concern   Not on file  Social History Narrative   Not on file   Social Determinants of Health   Financial Resource Strain: Not on file  Food Insecurity: Not on file  Transportation Needs: Not on file  Physical Activity: Not on file  Stress: Not on file  Social Connections: Not on file  Intimate Partner Violence: Not on file    Family History  Problem Relation Age of Onset   Heart disease Mother    Hypertension Mother    Arrhythmia Mother    Heart attack Mother     Diabetes Mother    Hypertension Father    Parkinson's disease Father    Coronary artery disease Maternal Uncle    Heart attack Maternal Uncle    Diabetes Sister    Diabetes Brother    Heart disease Brother    Breast cancer Neg Hx      Current Outpatient Medications:    acetaminophen (TYLENOL) 500 MG tablet, Take 500 mg by mouth every 6 (six) hours as needed., Disp: , Rfl:    albuterol (PROVENTIL HFA;VENTOLIN HFA) 108 (90 Base) MCG/ACT inhaler, Inhale 2 puffs into the lungs every 6 (six) hours as needed for wheezing or shortness of breath., Disp: , Rfl:    Ascorbic Acid (VITAMIN C) 1000 MG tablet, Take 2 tablets by mouth daily., Disp: , Rfl:    CALCIUM CITRATE PO, Take 1,000 mg by mouth daily., Disp: , Rfl:    carvedilol (COREG) 25 MG tablet, TAKE 1 TABLET BY MOUTH TWICE DAILY, Disp: 180 tablet, Rfl: 3   cholecalciferol (VITAMIN D) 1000 units tablet, Take 2,000 Units by mouth daily., Disp: , Rfl:    Cholecalciferol (VITAMIN D-3) 125 MCG (5000 UT) TABS, Take 2 tablets by mouth daily., Disp: , Rfl:    ezetimibe (ZETIA) 10 MG tablet, TAKE 1 TABLET BY MOUTH EVERY DAY, Disp: 90 tablet, Rfl: 3   Fluticasone-Salmeterol (ADVAIR) 250-50 MCG/DOSE AEPB, Inhale 1 puff into the lungs 2 (two) times daily. , Disp: , Rfl:    fluticasone-salmeterol (WIXELA INHUB) 250-50 MCG/ACT AEPB, Inhale 1 puff into the lungs in the morning and at bedtime., Disp: , Rfl:    furosemide (LASIX) 20 MG tablet, , Disp: , Rfl:    hydrALAZINE (APRESOLINE) 25 MG tablet, Take 1 tablet (25 mg total) by mouth 2 times daily at 12 noon and 4 pm., Disp: 180 tablet, Rfl: 3   letrozole (FEMARA) 2.5 MG tablet, TAKE 1 TABLET(2.5 MG) BY MOUTH DAILY, Disp: 90 tablet, Rfl: 2   loratadine (CLARITIN) 10 MG tablet, Take 10 mg by mouth daily as needed for allergies. , Disp: , Rfl:    losartan (COZAAR) 25 MG tablet, , Disp: , Rfl:    magnesium oxide (MAG-OX) 400 MG tablet, Take 2 tablets by mouth daily., Disp: , Rfl:    methylPREDNISolone  (MEDROL DOSEPAK) 4 MG TBPK tablet, Use as directed, Disp: 21 tablet, Rfl: 0   pantoprazole (PROTONIX) 40 MG tablet, Take 40 mg by mouth daily., Disp: , Rfl:    pregabalin (LYRICA) 50 MG capsule, TAKE ONE CAPSULE BY MOUTH TWICE DAILY, Disp: 180 capsule, Rfl: 2  solifenacin (VESICARE) 10 MG tablet, Take 10 mg by mouth every evening., Disp: , Rfl:    lidocaine-prilocaine (EMLA) cream, APPLY 1 APPLICATION TOPICALLY AS NEEDED (Patient not taking: Reported on 04/09/2023), Disp: 30 g, Rfl: 1   meloxicam (MOBIC) 15 MG tablet, Take 15 mg by mouth daily. (Patient not taking: Reported on 07/28/2023), Disp: , Rfl:    traMADol (ULTRAM) 50 MG tablet, Take 1 tablet (50 mg total) by mouth every 6 (six) hours as needed. (Patient not taking: Reported on 04/09/2023), Disp: 30 tablet, Rfl: 0  Physical exam:  Vitals:   07/28/23 1058 07/28/23 1104  BP: (!) 139/111 (!) 163/62  Pulse: 62 61  Resp: 18   Temp: (!) 96.5 F (35.8 C)   TempSrc: Tympanic   SpO2: 98%   Weight: 218 lb 4.8 oz (99 kg)   Height: 5\' 2"  (1.575 m)    Physical Exam Cardiovascular:     Rate and Rhythm: Normal rate and regular rhythm.     Heart sounds: Normal heart sounds.  Pulmonary:     Effort: Pulmonary effort is normal.     Breath sounds: Normal breath sounds.  Skin:    General: Skin is warm and dry.  Neurological:     Mental Status: She is alert and oriented to person, place, and time.   Breast exam was performed in seated and lying down position. Patient is status post right lumpectomy with a well-healed surgical scar. No evidence of any palpable masses. No evidence of axillary adenopathy. No evidence of any palpable masses or lumps in the left breast. No evidence of leftt axillary adenopathy       Latest Ref Rng & Units 03/10/2023   10:32 AM  CMP  Glucose 70 - 99 mg/dL 85   BUN 8 - 27 mg/dL 27   Creatinine 1.61 - 1.00 mg/dL 0.96   Sodium 045 - 409 mmol/L 144   Potassium 3.5 - 5.2 mmol/L 4.6   Chloride 96 - 106 mmol/L 103    CO2 20 - 29 mmol/L 27   Calcium 8.7 - 10.3 mg/dL 9.9       Latest Ref Rng & Units 04/23/2023    9:12 AM  CBC  WBC 4.0 - 10.5 K/uL 2.8   Hemoglobin 12.0 - 15.0 g/dL 81.1   Hematocrit 91.4 - 46.0 % 35.5   Platelets 150 - 400 K/uL 174     No images are attached to the encounter.  DG Chest 2 View  Result Date: 07/23/2023 CLINICAL DATA:  Productive cough for 3-4 days EXAM: CHEST - 2 VIEW COMPARISON:  Chest radiograph 11/07/2019 FINDINGS: The heart is mildly enlarged, unchanged. The upper mediastinal contours are normal Linear opacities in the right base and midlung and lingula are unchanged, likely reflecting scar. There is no focal airspace consolidation there is no pulmonary edema. There is no pleural effusion or pneumothorax. There is no acute osseous abnormality. Right upper quadrant surgical clips are noted. IMPRESSION: No radiographic evidence of acute cardiopulmonary process. Electronically Signed   By: Lesia Hausen M.D.   On: 07/23/2023 11:34     Assessment and plan- Patient is a 67 y.o. 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  She is s/p 6 cycles of neoadjuvant TCHP chemotherapy with residual 5 mm tumor.  She had persistent cytopenias and thrombocytopenia with Kadcyla and was switched to single agent maintenance Herceptin which she completed in February 2022.  She is here for routine f/u of breast cancer on letrozole  Patient will continue letrozole along with calcium and vit D at this time. Clinically no signs/ symptoms concerning for recurrence. Mammograms being coordinated by surgery. I will see her back in 6 months   Visit Diagnosis 1. Encounter for follow-up surveillance of breast cancer   2. Use of letrozole (Femara)      Dr. Owens Shark, MD, MPH [facility] [phone] 08/01/2023 3:51 PM

## 2023-08-03 ENCOUNTER — Encounter: Payer: Self-pay | Admitting: Radiation Oncology

## 2023-08-03 ENCOUNTER — Ambulatory Visit
Admission: RE | Admit: 2023-08-03 | Discharge: 2023-08-03 | Disposition: A | Discharge status: Home / Self Care | Payer: Medicare PPO | Source: Ambulatory Visit | Attending: Radiation Oncology | Admitting: Radiation Oncology

## 2023-08-03 VITALS — BP 157/73 | HR 53 | Temp 96.3°F | Resp 16 | Ht 62.0 in | Wt 222.0 lb

## 2023-08-03 DIAGNOSIS — C50411 Malignant neoplasm of upper-outer quadrant of right female breast: Secondary | ICD-10-CM | POA: Insufficient documentation

## 2023-08-03 DIAGNOSIS — Z170 Estrogen receptor positive status [ER+]: Secondary | ICD-10-CM | POA: Diagnosis not present

## 2023-08-03 DIAGNOSIS — Z923 Personal history of irradiation: Secondary | ICD-10-CM | POA: Insufficient documentation

## 2023-08-03 DIAGNOSIS — C50511 Malignant neoplasm of lower-outer quadrant of right female breast: Secondary | ICD-10-CM | POA: Diagnosis not present

## 2023-08-03 DIAGNOSIS — Z79811 Long term (current) use of aromatase inhibitors: Secondary | ICD-10-CM | POA: Diagnosis not present

## 2023-08-03 NOTE — Progress Notes (Signed)
Radiation Oncology Follow up Note  Name: Christina Mcclain   Date:   08/03/2023 MRN:  [id number] DOB: 1956/07/23    This 67 y.o. female presents to the clinic today for 3-year follow-up status post whole breast radiation to her right breast for triple positive invasive mammary carcinoma.  REFERRING PROVIDER: Margaretann Loveless, MD  HPI: Patient is a 67 year old female now out over 3 years having completed whole breast radiation to her right breast for triple positive invasive mammary carcinoma.  Seen today in routine follow-up she is doing well.  She specifically denies breast tenderness cough or bone pain still little tender in the site of her lumpectomy site which have assured her is just scar tissue.  She had mammograms back in.  November which were BI-RADS 2 benign.  She has mammograms again scheduled for this November.  She is currently on Femara tolerating it well without side effect  COMPLICATIONS OF TREATMENT: none  FOLLOW UP COMPLIANCE: keeps appointments   PHYSICAL EXAM:  BP (!) 157/73   Pulse (!) 53   Temp (!) 96.3 F (35.7 C)   Resp 16   Ht 5\' 2"  (1.575 m)   Wt 222 lb (100.7 kg)   BMI 40.60 kg/m  Lungs are clear to A&P cardiac examination essentially unremarkable with regular rate and rhythm. No dominant mass or nodularity is noted in either breast in 2 positions examined. Incision is well-healed. No axillary or supraclavicular adenopathy is appreciated. Cosmetic result is excellent.  Well-developed well-nourished patient in NAD. HEENT reveals PERLA, EOMI, discs not visualized.  Oral cavity is clear. No oral mucosal lesions are identified. Neck is clear without evidence of cervical or supraclavicular adenopathy. Lungs are clear to A&P. Cardiac examination is essentially unremarkable with regular rate and rhythm without murmur rub or thrill. Abdomen is benign with no organomegaly or masses noted. Motor sensory and DTR levels are equal and symmetric in the upper and lower extremities.  Cranial nerves II through XII are grossly intact. Proprioception is intact. No peripheral adenopathy or edema is identified. No motor or sensory levels are noted. Crude visual fields are within normal range.  RADIOLOGY RESULTS: Mammograms reviewed compatible with above-stated findings  PLAN: Present time patient is now out over 3 years with no evidence of disease.  I am turning follow-up care over to medical oncology.  Be happy to reevaluate the patient in time to that be indicated.  Patient knows to call with any concerns.  I would like to take this opportunity to thank you for allowing me to participate in the care of your patient.Carmina Miller, MD

## 2023-08-06 ENCOUNTER — Ambulatory Visit (INDEPENDENT_AMBULATORY_CARE_PROVIDER_SITE_OTHER): Payer: Medicare PPO

## 2023-08-06 DIAGNOSIS — R0602 Shortness of breath: Secondary | ICD-10-CM

## 2023-08-06 DIAGNOSIS — I351 Nonrheumatic aortic (valve) insufficiency: Secondary | ICD-10-CM | POA: Diagnosis not present

## 2023-08-06 DIAGNOSIS — I10 Essential (primary) hypertension: Secondary | ICD-10-CM

## 2023-08-06 DIAGNOSIS — I361 Nonrheumatic tricuspid (valve) insufficiency: Secondary | ICD-10-CM

## 2023-08-06 DIAGNOSIS — R0789 Other chest pain: Secondary | ICD-10-CM

## 2023-08-06 DIAGNOSIS — E782 Mixed hyperlipidemia: Secondary | ICD-10-CM

## 2023-08-06 DIAGNOSIS — G4733 Obstructive sleep apnea (adult) (pediatric): Secondary | ICD-10-CM

## 2023-08-11 ENCOUNTER — Ambulatory Visit (INDEPENDENT_AMBULATORY_CARE_PROVIDER_SITE_OTHER): Payer: Medicare PPO | Admitting: Cardiovascular Disease

## 2023-08-11 ENCOUNTER — Encounter: Payer: Self-pay | Admitting: Cardiovascular Disease

## 2023-08-11 VITALS — BP 130/80 | HR 63 | Ht 62.0 in | Wt 219.0 lb

## 2023-08-11 DIAGNOSIS — R0789 Other chest pain: Secondary | ICD-10-CM

## 2023-08-11 DIAGNOSIS — E118 Type 2 diabetes mellitus with unspecified complications: Secondary | ICD-10-CM

## 2023-08-11 DIAGNOSIS — I10 Essential (primary) hypertension: Secondary | ICD-10-CM

## 2023-08-11 DIAGNOSIS — R0602 Shortness of breath: Secondary | ICD-10-CM | POA: Insufficient documentation

## 2023-08-11 DIAGNOSIS — E782 Mixed hyperlipidemia: Secondary | ICD-10-CM | POA: Diagnosis not present

## 2023-08-11 MED ORDER — EMPAGLIFLOZIN 10 MG PO TABS: 10.0000 mg | ORAL_TABLET | Freq: Every day | ORAL | 2 refills | Status: DC

## 2023-08-11 NOTE — Progress Notes (Signed)
Cardiology Office Note   Date:  08/11/2023   ID:  Christina Mcclain, DOB 04-18-1956, MRN [id number]  PCP:  Margaretann Loveless, MD  Cardiologist:  Adrian Blackwater, MD      History of Present Illness: Christina Mcclain is a 67 y.o. female who presents for  Chief Complaint  Patient presents with   Follow-up    ECHO results    Doing well      Past Medical History:  Diagnosis Date   Adverse effect of anesthesia    hard to awaken   Anemia    Arthritis    Asthma    Asthma without status asthmaticus    unspecified; Take advair each night   Breast cancer (HCC) 2021   right breast IMC   Bronchitis    Chronic kidney disease    stage III per dr Cherylann Ratel   Constipation    Diabetes (HCC) 2010   Diabetes mellitus type 2, uncomplicated (HCC)    Diabetes mellitus without complication (HCC)    Encounter for blood transfusion    hysterectomy 9-10 years ago   Heart murmur    unspecified   Hyperlipidemia    Hypertension 1991   Hypothyroid    Multiple thyroid nodules    Neuromuscular disorder (HCC)    neuropathy   Osteoarthritis of right hip 02/04/2017   Palpitations    Pernicious anemia    Personal history of chemotherapy 2020-2021   Right breast ca   Personal history of radiation therapy 2021   right breast [facility]   Pneumonia    Sleep apnea    Use C-PAP; can't use mouth piece. Anxiety   Thyroid disease    nodules   Thyroid nodule    x2   Urinary urgency      Past Surgical History:  Procedure Laterality Date   ABDOMINAL HYSTERECTOMY  2011   partial   ABDOMINAL SURGERY     BREAST BIOPSY Right 10/02/2020   affirm bx, x marker, fat necrosis   BREAST BIOPSY Right 10/19/2019   two masses at 9:00 [place] bx, coil marker and venus marker, IMC for both   BREAST LUMPECTOMY Right 03/28/2020   bracketing of two IMC masses at 9:00.  Venus marker was pushed medially during wire loc. Remains in breast.  Clear surgical margins. Negative Lymph node   CARPAL TUNNEL RELEASE Left 2008    CARPAL TUNNEL RELEASE Right    CHOLECYSTECTOMY  1982   COLONOSCOPY WITH PROPOFOL N/A 10/05/2017   Procedure: COLONOSCOPY WITH PROPOFOL;  Surgeon: Scot Jun, MD;  Location: [facility] ENDOSCOPY;  Service: Endoscopy;  Laterality: N/A;   COLONOSCOPY WITH PROPOFOL N/A 08/09/2021   Procedure: COLONOSCOPY WITH PROPOFOL;  Surgeon: Jaynie Collins, DO;  Location: [facility] ENDOSCOPY;  Service: Gastroenterology;  Laterality: N/A;   DIAGNOSTIC LAPAROSCOPY     ESOPHAGOGASTRODUODENOSCOPY (EGD) WITH PROPOFOL N/A 08/09/2021   Procedure: ESOPHAGOGASTRODUODENOSCOPY (EGD) WITH PROPOFOL;  Surgeon: Jaynie Collins, DO;  Location: [facility] ENDOSCOPY;  Service: Gastroenterology;  Laterality: N/A;   JOINT REPLACEMENT Right 02/09/2017   TOTAL HIP REPLACEMENT, DR. CARTER. [place]   KNEE ARTHROSCOPY  1990s   PARTIAL MASTECTOMY WITH NEEDLE LOCALIZATION AND AXILLARY SENTINEL LYMPH NODE BX Right 03/28/2020   Procedure: PARTIAL MASTECTOMY WITH NEEDLE LOCALIZATION AND AXILLARY SENTINEL LYMPH NODE BX;  Surgeon: Carolan Shiver, MD;  Location: [facility] ORS;  Service: General;  Laterality: Right;   PORTACATH PLACEMENT Left 11/07/2019   Procedure: INSERTION PORT-A-CATH;  Surgeon: Carolan Shiver, MD;  Location: [facility] ORS;  Service:  General;  Laterality: Left;   TOTAL HIP ARTHROPLASTY Left 06/23/2017   Procedure: TOTAL HIP ARTHROPLASTY ANTERIOR APPROACH;  Surgeon: Kennedy Bucker, MD;  Location: [facility] ORS;  Service: Orthopedics;  Laterality: Left;     Current Outpatient Medications  Medication Sig Dispense Refill   empagliflozin (JARDIANCE) 10 MG TABS tablet Take 1 tablet (10 mg total) by mouth daily before breakfast. 30 tablet 2   methylPREDNISolone (MEDROL DOSEPAK) 4 MG TBPK tablet Use as directed 21 tablet 0   acetaminophen (TYLENOL) 500 MG tablet Take 500 mg by mouth every 6 (six) hours as needed.     albuterol (PROVENTIL HFA;VENTOLIN HFA) 108 (90 Base) MCG/ACT inhaler Inhale 2 puffs into the lungs every 6 (six)  hours as needed for wheezing or shortness of breath.     Ascorbic Acid (VITAMIN C) 1000 MG tablet Take 2 tablets by mouth daily.     CALCIUM CITRATE PO Take 1,000 mg by mouth daily.     carvedilol (COREG) 25 MG tablet TAKE 1 TABLET BY MOUTH TWICE DAILY 180 tablet 3   cholecalciferol (VITAMIN D) 1000 units tablet Take 2,000 Units by mouth daily.     Cholecalciferol (VITAMIN D-3) 125 MCG (5000 UT) TABS Take 2 tablets by mouth daily.     ezetimibe (ZETIA) 10 MG tablet TAKE 1 TABLET BY MOUTH EVERY DAY 90 tablet 3   Fluticasone-Salmeterol (ADVAIR) 250-50 MCG/DOSE AEPB Inhale 1 puff into the lungs 2 (two) times daily.      fluticasone-salmeterol (WIXELA INHUB) 250-50 MCG/ACT AEPB Inhale 1 puff into the lungs in the morning and at bedtime.     furosemide (LASIX) 20 MG tablet      hydrALAZINE (APRESOLINE) 25 MG tablet Take 1 tablet (25 mg total) by mouth 2 times daily at 12 noon and 4 pm. 180 tablet 3   letrozole (FEMARA) 2.5 MG tablet TAKE 1 TABLET(2.5 MG) BY MOUTH DAILY 90 tablet 2   lidocaine-prilocaine (EMLA) cream APPLY 1 APPLICATION TOPICALLY AS NEEDED (Patient not taking: Reported on 04/09/2023) 30 g 1   loratadine (CLARITIN) 10 MG tablet Take 10 mg by mouth daily as needed for allergies.      losartan (COZAAR) 25 MG tablet      magnesium oxide (MAG-OX) 400 MG tablet Take 2 tablets by mouth daily.     meloxicam (MOBIC) 15 MG tablet Take 15 mg by mouth daily. (Patient not taking: Reported on 07/28/2023)     pantoprazole (PROTONIX) 40 MG tablet Take 40 mg by mouth daily.     pregabalin (LYRICA) 50 MG capsule TAKE ONE CAPSULE BY MOUTH TWICE DAILY 180 capsule 2   solifenacin (VESICARE) 10 MG tablet Take 10 mg by mouth every evening.     traMADol (ULTRAM) 50 MG tablet Take 1 tablet (50 mg total) by mouth every 6 (six) hours as needed. (Patient not taking: Reported on 04/09/2023) 30 tablet 0   No current facility-administered medications for this visit.    Allergies:   Amlodipine and Statins     Social History:   reports that she has never smoked. She has never used smokeless tobacco. She reports that she does not drink alcohol and does not use drugs.   Family History:  family history includes Arrhythmia in her mother; Coronary artery disease in her maternal uncle; Diabetes in her brother, mother, and sister; Heart attack in her maternal uncle and mother; Heart disease in her brother and mother; Hypertension in her father and mother; Parkinson's disease in her father.  ROS:     Review of Systems  Constitutional: Negative.   HENT: Negative.    Eyes: Negative.   Respiratory: Negative.    Gastrointestinal: Negative.   Genitourinary: Negative.   Musculoskeletal: Negative.   Skin: Negative.   Neurological: Negative.   Endo/Heme/Allergies: Negative.   Psychiatric/Behavioral: Negative.    All other systems reviewed and are negative.     All other systems are reviewed and negative.    PHYSICAL EXAM: VS:  BP 130/80   Pulse 63   Ht 5\' 2"  (1.575 m)   Wt 219 lb (99.3 kg)   SpO2 96%   BMI 40.06 kg/m  , BMI Body mass index is 40.06 kg/m. Last weight:  Wt Readings from Last 3 Encounters:  08/11/23 219 lb (99.3 kg)  08/03/23 222 lb (100.7 kg)  07/28/23 218 lb 4.8 oz (99 kg)     Physical Exam Constitutional:      Appearance: Normal appearance.  Cardiovascular:     Rate and Rhythm: Normal rate and regular rhythm.     Heart sounds: Normal heart sounds.  Pulmonary:     Effort: Pulmonary effort is normal.     Breath sounds: Normal breath sounds.  Musculoskeletal:     Right lower leg: No edema.     Left lower leg: No edema.  Neurological:     Mental Status: She is alert.       EKG:   Recent Labs: 01/23/2023: ALT 13 03/10/2023: BUN 27; Creatinine, Ser 1.45; Potassium 4.6; Sodium 144 04/23/2023: Hemoglobin 10.5; Platelets 174    Lipid Panel No results found for: "CHOL", "TRIG", "HDL", "CHOLHDL", "VLDL", "LDLCALC", "LDLDIRECT"    Other studies  Reviewed: Additional studies/ records that were reviewed today include:  Review of the above records demonstrates:       No data to display            ASSESSMENT AND PLAN:    ICD-10-CM   1. Essential hypertension, benign  I10 empagliflozin (JARDIANCE) 10 MG TABS tablet    2. Controlled type 2 diabetes mellitus with complication, without long-term current use of insulin (HCC)  E11.8 empagliflozin (JARDIANCE) 10 MG TABS tablet    3. Mixed hyperlipidemia  E78.2 empagliflozin (JARDIANCE) 10 MG TABS tablet    4. Chest pain, non-cardiac  R07.89 empagliflozin (JARDIANCE) 10 MG TABS tablet    5. SOB (shortness of breath)  R06.02 empagliflozin (JARDIANCE) 10 MG TABS tablet   Has LVEF 67%, grade 3 diastolic function ,add jardiance 10 mg as may have HEpEF       Problem List Items Addressed This Visit       Cardiovascular and Mediastinum   Essential hypertension, benign - Primary   Relevant Medications   empagliflozin (JARDIANCE) 10 MG TABS tablet     Endocrine   Controlled type 2 diabetes mellitus with complication, without long-term current use of insulin (HCC)   Relevant Medications   empagliflozin (JARDIANCE) 10 MG TABS tablet     Other   Mixed hyperlipidemia   Relevant Medications   empagliflozin (JARDIANCE) 10 MG TABS tablet   SOB (shortness of breath)   Relevant Medications   empagliflozin (JARDIANCE) 10 MG TABS tablet   Other Visit Diagnoses     Chest pain, non-cardiac       Relevant Medications   empagliflozin (JARDIANCE) 10 MG TABS tablet          Disposition:   Return in about 3 months (around 11/10/2023).    Total time spent: 30  minutes  Signed,  Adrian Blackwater, MD  08/11/2023 10:39 AM    [facility]

## 2023-08-19 ENCOUNTER — Other Ambulatory Visit: Payer: Self-pay

## 2023-08-19 DIAGNOSIS — I10 Essential (primary) hypertension: Secondary | ICD-10-CM

## 2023-08-19 DIAGNOSIS — R0602 Shortness of breath: Secondary | ICD-10-CM

## 2023-08-19 DIAGNOSIS — E118 Type 2 diabetes mellitus with unspecified complications: Secondary | ICD-10-CM

## 2023-08-19 DIAGNOSIS — E782 Mixed hyperlipidemia: Secondary | ICD-10-CM

## 2023-08-19 DIAGNOSIS — R0789 Other chest pain: Secondary | ICD-10-CM

## 2023-08-20 MED ORDER — EMPAGLIFLOZIN 10 MG PO TABS
10.0000 mg | ORAL_TABLET | Freq: Every day | ORAL | 2 refills | Status: DC
Start: 2023-08-20 — End: 2024-07-21

## 2023-08-27 DIAGNOSIS — I5030 Unspecified diastolic (congestive) heart failure: Secondary | ICD-10-CM | POA: Diagnosis not present

## 2023-08-27 DIAGNOSIS — I10 Essential (primary) hypertension: Secondary | ICD-10-CM | POA: Diagnosis not present

## 2023-08-27 DIAGNOSIS — Z7689 Persons encountering health services in other specified circumstances: Secondary | ICD-10-CM | POA: Diagnosis not present

## 2023-08-27 DIAGNOSIS — I110 Hypertensive heart disease with heart failure: Secondary | ICD-10-CM | POA: Diagnosis not present

## 2023-09-07 DIAGNOSIS — N1832 Chronic kidney disease, stage 3b: Secondary | ICD-10-CM | POA: Diagnosis not present

## 2023-09-07 DIAGNOSIS — I10 Essential (primary) hypertension: Secondary | ICD-10-CM | POA: Diagnosis not present

## 2023-09-08 ENCOUNTER — Telehealth: Payer: Self-pay | Admitting: *Deleted

## 2023-09-08 MED ORDER — EXEMESTANE 25 MG PO TABS: 25.0000 mg | ORAL_TABLET | Freq: Every day | ORAL | 1 refills | Status: DC

## 2023-09-08 NOTE — Telephone Encounter (Signed)
Patient called stating that she stopped her Letrozole due to leg pains ans is asking what she is to do now. Please advise

## 2023-09-08 NOTE — Telephone Encounter (Signed)
Call returned to patient and informed that Dr Maryjean Morn her to wait 2 weeks and then start Aromasin  patient states that she has never been on aromasin and will wait 2 weeks before starting med

## 2023-09-16 ENCOUNTER — Other Ambulatory Visit: Payer: Self-pay | Admitting: Student

## 2023-09-16 ENCOUNTER — Ambulatory Visit
Admission: RE | Admit: 2023-09-16 | Discharge: 2023-09-16 | Disposition: A | Discharge status: Home / Self Care | Payer: Medicare PPO | Source: Ambulatory Visit | Attending: Student | Admitting: Student

## 2023-09-16 DIAGNOSIS — R2689 Other abnormalities of gait and mobility: Secondary | ICD-10-CM | POA: Diagnosis not present

## 2023-09-16 DIAGNOSIS — R42 Dizziness and giddiness: Secondary | ICD-10-CM

## 2023-09-16 DIAGNOSIS — G629 Polyneuropathy, unspecified: Secondary | ICD-10-CM | POA: Diagnosis not present

## 2023-09-16 DIAGNOSIS — R519 Headache, unspecified: Secondary | ICD-10-CM | POA: Diagnosis not present

## 2023-09-17 ENCOUNTER — Telehealth: Payer: Medicare PPO

## 2023-09-17 DIAGNOSIS — M1712 Unilateral primary osteoarthritis, left knee: Secondary | ICD-10-CM | POA: Diagnosis not present

## 2023-09-17 NOTE — Telephone Encounter (Signed)
Patient called stating she would like to continue taking the Letrozole 2.5mg  and not start the alternative medication discussed.  She states she found out that was not the cause of her leg pain, it was actually the neuropathy making them hurt.

## 2023-09-18 ENCOUNTER — Telehealth: Payer: Self-pay | Admitting: *Deleted

## 2023-09-18 ENCOUNTER — Other Ambulatory Visit: Payer: Self-pay | Admitting: *Deleted

## 2023-09-18 NOTE — Telephone Encounter (Addendum)
Dr Smith Robert wanted me to check if pt needs letrozole filled again. The pt came off the letrozole for 2 weeks and Dr. Smith Robert sent another med  aromasin to see if that would work better. I called the pt and asked her if she needs a rx for letrozole. She states that she has 1 month and has refills and  does not need anything. She never took the aromasin at all and I took the rx off her list

## 2023-09-18 NOTE — Telephone Encounter (Signed)
Can we make sure she has letrozole

## 2023-09-24 DIAGNOSIS — M1712 Unilateral primary osteoarthritis, left knee: Secondary | ICD-10-CM | POA: Diagnosis not present

## 2023-09-24 DIAGNOSIS — M170 Bilateral primary osteoarthritis of knee: Secondary | ICD-10-CM | POA: Diagnosis not present

## 2023-09-28 DIAGNOSIS — L218 Other seborrheic dermatitis: Secondary | ICD-10-CM | POA: Diagnosis not present

## 2023-09-30 ENCOUNTER — Other Ambulatory Visit: Payer: Self-pay

## 2023-09-30 ENCOUNTER — Ambulatory Visit: Payer: Medicare PPO | Attending: Student

## 2023-09-30 DIAGNOSIS — R42 Dizziness and giddiness: Secondary | ICD-10-CM | POA: Diagnosis not present

## 2023-09-30 DIAGNOSIS — R262 Difficulty in walking, not elsewhere classified: Secondary | ICD-10-CM | POA: Insufficient documentation

## 2023-09-30 DIAGNOSIS — R2681 Unsteadiness on feet: Secondary | ICD-10-CM | POA: Insufficient documentation

## 2023-09-30 DIAGNOSIS — R2689 Other abnormalities of gait and mobility: Secondary | ICD-10-CM | POA: Diagnosis not present

## 2023-09-30 NOTE — Therapy (Signed)
OUTPATIENT PHYSICAL THERAPY VESTIBULAR EVALUATION     Patient Name: Christina Mcclain MRN: [id number] DOB:1956/05/24, 67 y.o., female Today's Date: 09/30/2023  END OF SESSION:  PT End of Session - 09/30/23 1157     Visit Number 1    Number of Visits 5    Date for PT Re-Evaluation 10/28/23    Authorization Type [employer]    PT Start Time 1156   late arrival   PT Stop Time 1230    PT Time Calculation (min) 34 min             Past Medical History:  Diagnosis Date   Adverse effect of anesthesia    hard to awaken   Anemia    Arthritis    Asthma    Asthma without status asthmaticus    unspecified; Take advair each night   Breast cancer (HCC) 2021   right breast IMC   Bronchitis    Chronic kidney disease    stage III per dr Cherylann Ratel   Constipation    Diabetes (HCC) 2010   Diabetes mellitus type 2, uncomplicated (HCC)    Diabetes mellitus without complication (HCC)    Encounter for blood transfusion    hysterectomy 9-10 years ago   Heart murmur    unspecified   Hyperlipidemia    Hypertension 1991   Hypothyroid    Multiple thyroid nodules    Neuromuscular disorder (HCC)    neuropathy   Osteoarthritis of right hip 02/04/2017   Palpitations    Pernicious anemia    Personal history of chemotherapy 2020-2021   Right breast ca   Personal history of radiation therapy 2021   right breast [facility]   Pneumonia    Sleep apnea    Use C-PAP; can't use mouth piece. Anxiety   Thyroid disease    nodules   Thyroid nodule    x2   Urinary urgency    Past Surgical History:  Procedure Laterality Date   ABDOMINAL HYSTERECTOMY  2011   partial   ABDOMINAL SURGERY     BREAST BIOPSY Right 10/02/2020   affirm bx, x marker, fat necrosis   BREAST BIOPSY Right 10/19/2019   two masses at 9:00 [place] bx, coil marker and venus marker, IMC for both   BREAST LUMPECTOMY Right 03/28/2020   bracketing of two IMC masses at 9:00.  Venus marker was pushed medially during wire loc. Remains  in breast.  Clear surgical margins. Negative Lymph node   CARPAL TUNNEL RELEASE Left 2008   CARPAL TUNNEL RELEASE Right    CHOLECYSTECTOMY  1982   COLONOSCOPY WITH PROPOFOL N/A 10/05/2017   Procedure: COLONOSCOPY WITH PROPOFOL;  Surgeon: Scot Jun, MD;  Location: [facility] ENDOSCOPY;  Service: Endoscopy;  Laterality: N/A;   COLONOSCOPY WITH PROPOFOL N/A 08/09/2021   Procedure: COLONOSCOPY WITH PROPOFOL;  Surgeon: Jaynie Collins, DO;  Location: [facility] ENDOSCOPY;  Service: Gastroenterology;  Laterality: N/A;   DIAGNOSTIC LAPAROSCOPY     ESOPHAGOGASTRODUODENOSCOPY (EGD) WITH PROPOFOL N/A 08/09/2021   Procedure: ESOPHAGOGASTRODUODENOSCOPY (EGD) WITH PROPOFOL;  Surgeon: Jaynie Collins, DO;  Location: [facility] ENDOSCOPY;  Service: Gastroenterology;  Laterality: N/A;   JOINT REPLACEMENT Right 02/09/2017   TOTAL HIP REPLACEMENT, DR. CARTER. [place]   KNEE ARTHROSCOPY  1990s   PARTIAL MASTECTOMY WITH NEEDLE LOCALIZATION AND AXILLARY SENTINEL LYMPH NODE BX Right 03/28/2020   Procedure: PARTIAL MASTECTOMY WITH NEEDLE LOCALIZATION AND AXILLARY SENTINEL LYMPH NODE BX;  Surgeon: Carolan Shiver, MD;  Location: [facility] ORS;  Service: General;  Laterality: Right;  PORTACATH PLACEMENT Left 11/07/2019   Procedure: INSERTION PORT-A-CATH;  Surgeon: Carolan Shiver, MD;  Location: [facility] ORS;  Service: General;  Laterality: Left;   TOTAL HIP ARTHROPLASTY Left 06/23/2017   Procedure: TOTAL HIP ARTHROPLASTY ANTERIOR APPROACH;  Surgeon: Kennedy Bucker, MD;  Location: [facility] ORS;  Service: Orthopedics;  Laterality: Left;   Patient Active Problem List   Diagnosis Date Noted   SOB (shortness of breath) 08/11/2023   Urinary tract infection without hematuria 04/09/2023   Mixed hyperlipidemia 04/09/2023   Rash of face 03/17/2023   Absolute anemia 03/17/2023   Palpitations 11/09/2019   Pelvic pressure in female 11/09/2019   Invasive carcinoma of breast (HCC) 10/31/2019   Goals of care,  counseling/discussion 10/31/2019   Nonintractable headache 08/03/2019   Tinnitus of both ears 08/03/2019   Bilateral hand numbness 09/14/2018   Bilateral hand pain 09/14/2018   Myalgia due to statin 08/26/2018   BMI 40.0-44.9, adult (HCC) 12/16/2017   Primary localized osteoarthrosis of left hip 06/23/2017   Controlled type 2 diabetes mellitus with complication, without long-term current use of insulin (HCC) 03/17/2017   Pernicious anemia 03/17/2017   Primary osteoarthritis involving multiple joints 03/17/2017   Status post right hip replacement 02/20/2017   At high risk for falls 02/11/2017   Mild persistent asthma without complication 02/11/2017   Impaired mobility and ADLs 02/11/2017   Myoclonus 07/23/2016   DISH (diffuse idiopathic skeletal hyperostosis) 07/23/2016   Obstructive sleep apnea syndrome 07/23/2016   Thyromegaly 07/23/2016   Left thyroid nodule 09/04/2014   Chest pain, unspecified 11/14/2012   High cholesterol 11/14/2012   Gastro-esophageal reflux disease without esophagitis 11/14/2012   Family history of early CAD 11/14/2012   Overactive bladder 04/12/2012   Hemorrhoids 01/08/2010   Essential hypertension, benign 01/08/2010   Osteoarthritis of right hip 01/08/2010    PCP: Margaretann Loveless, MD REFERRING PROVIDER: Janice Coffin, PA-C  REFERRING DIAG: G62.9 (ICD-10-CM) - Polyneuropathy, unspecified R26.89 (ICD-10-CM) - Other abnormalities of gait and mobility  THERAPY DIAG:  Unsteadiness on feet  Difficulty in walking, not elsewhere classified  Other abnormalities of gait and mobility  Dizziness and giddiness  ONSET DATE: worse past 6 months  Rationale for Evaluation and Treatment: Rehabilitation  SUBJECTIVE:   SUBJECTIVE STATEMENT: Experiencing neuropathy in hands/legs which she attributes to hx of chemotherapy x 3 years with progressive worsening over past 6 months experiencing burning, aching, cramping. Pt has recent episode of BPPV and notable when  laying down and left head rotation. Reports experiencing HA pains, ringing in the ear, denies photo/phonophobia.  Notes imbalance with head/body turns.  Pt accompanied by: self  PERTINENT HISTORY: Vertigo, several minutes, sudden onset in supine position Sudden onset vertigo in the supine position 09/15/2023, resolved. Headache this morning, resolved by time of appointment. R TKR, left knee OA  PAIN:  Are you having pain? No  PRECAUTIONS: Fall  RED FLAGS: None   WEIGHT BEARING RESTRICTIONS: No  FALLS: Has patient fallen in last 6 months? No  LIVING ENVIRONMENT: Lives with: lives with their spouse Lives in: House/apartment Stairs: No Has following equipment at home: Single point cane  PLOF: Independent with basic ADLs and Independent with household mobility without device  PATIENT GOALS:   OBJECTIVE:  Note: Objective measures were completed at Evaluation unless otherwise noted.  DIAGNOSTIC FINDINGS: IMPRESSION: No acute intracranial abnormality.  COGNITION: Overall cognitive status: Within functional limits for tasks assessed   SENSATION: Not tested Reports stocking distribution of symptoms in her feet Pain in left hand > right  EDEMA:  Slight in bilat ankles  MUSCLE TONE:  NT  DTRs:  NT  POSTURE:  No Significant postural limitations  Cervical ROM:    Globally limited ROM extension > lateral flexion > rotation  STRENGTH:   LOWER EXTREMITY MMT:   BLE gross 4/5 strength  BED MOBILITY:  indep  TRANSFERS: Assistive device utilized: None  Sit to stand: Complete Independence Stand to sit: Complete Independence Chair to chair: Complete Independence Floor:  NT  RAMP: NT  CURB: Modified independence  GAIT: Gait pattern:  very limited left knee flexion during gait due to antalgia left knee OA Distance walked:  Assistive device utilized: Single point cane and None Level of assistance: Modified independence Comments:   FUNCTIONAL TESTS:  5 times  sit to stand: 22.47 sec Dynamic Gait Index: 10/24  M-CTSIB  Condition 1: Firm Surface, EO 30 Sec, Normal Sway  Condition 2: Firm Surface, EC 30 Sec, Mild Sway  Condition 3: Foam Surface, EO 30 Sec, Mild Sway  Condition 4: Foam Surface, EC 30 Sec, Moderate Sway      VESTIBULAR ASSESSMENT: to be completed at next session   PATIENT EDUCATION: Education details: assessment details, rationale of intervention, HEP initiatoin Person educated: Patient Education method: Explanation and Handouts Education comprehension: verbalized understanding  HOME EXERCISE PROGRAM: Access Code: [email or web address] URL: [email or web address] Date: 09/30/2023 Prepared by: Shary Decamp  Exercises - Corner Balance Feet Together With Eyes Open  - 1 x daily - 7 x weekly - 3 sets - 30 sec hold - Corner Balance Feet Together With Eyes Closed  - 1 x daily - 7 x weekly - 3 sets - 30 sec hold - Corner Balance Feet Together: Eyes Open With Head Turns  - 1 x daily - 7 x weekly - 3 sets - 3 reps - Corner Balance Feet Together: Eyes Closed With Head Turns  - 1 x daily - 7 x weekly - 3 sets - 3 reps  GOALS: Goals reviewed with patient? Yes  SHORT TERM GOALS: Target date: same as LTG    LONG TERM GOALS: Target date: 10/28/2023    The patient will be independent with HEP for gaze adaptation, habituation, balance, and general mobility. Baseline:  Goal status: INITIAL  2.  Demo improved postural stability per normal-mild sway condition 4 M-CTSIB to improve safety with ADL Baseline:  Goal status: INITIAL  3.  Reduce risk for falls per score 19/24 Dynamic Gait Index Baseline: 10/24 Goal status: INITIAL  4.  Pt to report absence of positional dizziness to improve comfort and reduce risk for falls during mobility Baseline: TBD Goal status: INITIAL    ASSESSMENT:  CLINICAL IMPRESSION: Patient is a 67 y.o. lady who was seen today for physical therapy evaluation and treatment for polyneuropathy and  recent hx of vertigo.  Notes ongoing sensory disturbance BLE and causing unsteadiness on feet exhibiting generalized weakness, decreased postural stability under M-CTSIB, and increased risk for falls per DGI. Pt reports recent hx of vertigo with ongoing symptoms with position change and certain head turns/positions causing imbalance and dizziness, we were unable to assess at this meeting due to time constraints.  Pt would benefit form PT services to address deficits and limitations and reduce risk for falls  OBJECTIVE IMPAIRMENTS: Abnormal gait, decreased activity tolerance, decreased balance, difficulty walking, decreased ROM, decreased strength, dizziness, increased muscle spasms, improper body mechanics, and pain.   ACTIVITY LIMITATIONS: carrying, lifting, stairs, bed mobility, reach over head, and locomotion level  PARTICIPATION LIMITATIONS: meal prep, cleaning,  interpersonal relationship, shopping, and exercise  PERSONAL FACTORS: Age, Time since onset of injury/illness/exacerbation, and 3+ comorbidities: PMH  are also affecting patient's functional outcome.   REHAB POTENTIAL: Excellent  CLINICAL DECISION MAKING: Evolving/moderate complexity  EVALUATION COMPLEXITY: Moderate   PLAN:  PT FREQUENCY: 1x/week  PT DURATION: 4 weeks  PLANNED INTERVENTIONS: 97110-Therapeutic exercises, 97530- Therapeutic activity, 97112- Neuromuscular re-education, 97535- Self Care, 16109- Manual therapy, [phone]- Gait training, [phone]- Canalith repositioning, [phone]- Aquatic Therapy, Patient/Family education, Balance training, Stair training, Joint mobilization, Spinal mobilization, Vestibular training, and DME instructions  PLAN FOR NEXT SESSION: vestibular assessment   1:06 PM, 09/30/23 M. Shary Decamp, PT, DPT Physical Therapist- [place] Office Number: [phone]   Referring diagnosis? G62.9 (ICD-10-CM) - Polyneuropathy, unspecified R26.89 (ICD-10-CM) - Other abnormalities of gait and  mobility Treatment diagnosis? (if different than referring diagnosis)  Unsteadiness on feet  Difficulty in walking, not elsewhere classified  Other abnormalities of gait and mobility  Dizziness and giddiness What was this (referring dx) caused by? []  Surgery []  Fall [x]  Ongoing issue []  Arthritis []  Other: ____________  Laterality: []  Rt []  Lt [x]  Both  Check all possible CPT codes:  *CHOOSE 10 OR LESS*    See Planned Interventions listed in the Plan section of the Evaluation.

## 2023-10-05 DIAGNOSIS — Z01419 Encounter for gynecological examination (general) (routine) without abnormal findings: Secondary | ICD-10-CM | POA: Diagnosis not present

## 2023-10-05 DIAGNOSIS — Z1331 Encounter for screening for depression: Secondary | ICD-10-CM | POA: Diagnosis not present

## 2023-10-06 DIAGNOSIS — M1712 Unilateral primary osteoarthritis, left knee: Secondary | ICD-10-CM | POA: Diagnosis not present

## 2023-10-07 ENCOUNTER — Ambulatory Visit: Payer: Medicare PPO

## 2023-10-07 DIAGNOSIS — R2689 Other abnormalities of gait and mobility: Secondary | ICD-10-CM

## 2023-10-07 DIAGNOSIS — R2681 Unsteadiness on feet: Secondary | ICD-10-CM | POA: Diagnosis not present

## 2023-10-07 DIAGNOSIS — R262 Difficulty in walking, not elsewhere classified: Secondary | ICD-10-CM | POA: Diagnosis not present

## 2023-10-07 DIAGNOSIS — R42 Dizziness and giddiness: Secondary | ICD-10-CM | POA: Diagnosis not present

## 2023-10-07 NOTE — Therapy (Signed)
OUTPATIENT PHYSICAL THERAPY VESTIBULAR TREATMENT     Patient Name: Christina Mcclain MRN: [id number] DOB:1956-10-20, 67 y.o., female Today's Date: 10/07/2023  END OF SESSION:  PT End of Session - 10/07/23 1108     Visit Number 2    Number of Visits 5    Date for PT Re-Evaluation 10/28/23    Authorization Type [employer]    PT Start Time 1100    PT Stop Time 1145    PT Time Calculation (min) 45 min             Past Medical History:  Diagnosis Date   Adverse effect of anesthesia    hard to awaken   Anemia    Arthritis    Asthma    Asthma without status asthmaticus    unspecified; Take advair each night   Breast cancer (HCC) 2021   right breast IMC   Bronchitis    Chronic kidney disease    stage III per dr Cherylann Ratel   Constipation    Diabetes (HCC) 2010   Diabetes mellitus type 2, uncomplicated (HCC)    Diabetes mellitus without complication (HCC)    Encounter for blood transfusion    hysterectomy 9-10 years ago   Heart murmur    unspecified   Hyperlipidemia    Hypertension 1991   Hypothyroid    Multiple thyroid nodules    Neuromuscular disorder (HCC)    neuropathy   Osteoarthritis of right hip 02/04/2017   Palpitations    Pernicious anemia    Personal history of chemotherapy 2020-2021   Right breast ca   Personal history of radiation therapy 2021   right breast [facility]   Pneumonia    Sleep apnea    Use C-PAP; can't use mouth piece. Anxiety   Thyroid disease    nodules   Thyroid nodule    x2   Urinary urgency    Past Surgical History:  Procedure Laterality Date   ABDOMINAL HYSTERECTOMY  2011   partial   ABDOMINAL SURGERY     BREAST BIOPSY Right 10/02/2020   affirm bx, x marker, fat necrosis   BREAST BIOPSY Right 10/19/2019   two masses at 9:00 [place] bx, coil marker and venus marker, IMC for both   BREAST LUMPECTOMY Right 03/28/2020   bracketing of two IMC masses at 9:00.  Venus marker was pushed medially during wire loc. Remains in breast.   Clear surgical margins. Negative Lymph node   CARPAL TUNNEL RELEASE Left 2008   CARPAL TUNNEL RELEASE Right    CHOLECYSTECTOMY  1982   COLONOSCOPY WITH PROPOFOL N/A 10/05/2017   Procedure: COLONOSCOPY WITH PROPOFOL;  Surgeon: Scot Jun, MD;  Location: [facility] ENDOSCOPY;  Service: Endoscopy;  Laterality: N/A;   COLONOSCOPY WITH PROPOFOL N/A 08/09/2021   Procedure: COLONOSCOPY WITH PROPOFOL;  Surgeon: Jaynie Collins, DO;  Location: [facility] ENDOSCOPY;  Service: Gastroenterology;  Laterality: N/A;   DIAGNOSTIC LAPAROSCOPY     ESOPHAGOGASTRODUODENOSCOPY (EGD) WITH PROPOFOL N/A 08/09/2021   Procedure: ESOPHAGOGASTRODUODENOSCOPY (EGD) WITH PROPOFOL;  Surgeon: Jaynie Collins, DO;  Location: [facility] ENDOSCOPY;  Service: Gastroenterology;  Laterality: N/A;   JOINT REPLACEMENT Right 02/09/2017   TOTAL HIP REPLACEMENT, DR. CARTER. [place]   KNEE ARTHROSCOPY  1990s   PARTIAL MASTECTOMY WITH NEEDLE LOCALIZATION AND AXILLARY SENTINEL LYMPH NODE BX Right 03/28/2020   Procedure: PARTIAL MASTECTOMY WITH NEEDLE LOCALIZATION AND AXILLARY SENTINEL LYMPH NODE BX;  Surgeon: Carolan Shiver, MD;  Location: [facility] ORS;  Service: General;  Laterality: Right;   PORTACATH  PLACEMENT Left 11/07/2019   Procedure: INSERTION PORT-A-CATH;  Surgeon: Carolan Shiver, MD;  Location: [facility] ORS;  Service: General;  Laterality: Left;   TOTAL HIP ARTHROPLASTY Left 06/23/2017   Procedure: TOTAL HIP ARTHROPLASTY ANTERIOR APPROACH;  Surgeon: Kennedy Bucker, MD;  Location: [facility] ORS;  Service: Orthopedics;  Laterality: Left;   Patient Active Problem List   Diagnosis Date Noted   SOB (shortness of breath) 08/11/2023   Urinary tract infection without hematuria 04/09/2023   Mixed hyperlipidemia 04/09/2023   Rash of face 03/17/2023   Absolute anemia 03/17/2023   Palpitations 11/09/2019   Pelvic pressure in female 11/09/2019   Invasive carcinoma of breast (HCC) 10/31/2019   Goals of care, counseling/discussion  10/31/2019   Nonintractable headache 08/03/2019   Tinnitus of both ears 08/03/2019   Bilateral hand numbness 09/14/2018   Bilateral hand pain 09/14/2018   Myalgia due to statin 08/26/2018   BMI 40.0-44.9, adult (HCC) 12/16/2017   Primary localized osteoarthrosis of left hip 06/23/2017   Controlled type 2 diabetes mellitus with complication, without long-term current use of insulin (HCC) 03/17/2017   Pernicious anemia 03/17/2017   Primary osteoarthritis involving multiple joints 03/17/2017   Status post right hip replacement 02/20/2017   At high risk for falls 02/11/2017   Mild persistent asthma without complication 02/11/2017   Impaired mobility and ADLs 02/11/2017   Myoclonus 07/23/2016   DISH (diffuse idiopathic skeletal hyperostosis) 07/23/2016   Obstructive sleep apnea syndrome 07/23/2016   Thyromegaly 07/23/2016   Left thyroid nodule 09/04/2014   Chest pain, unspecified 11/14/2012   High cholesterol 11/14/2012   Gastro-esophageal reflux disease without esophagitis 11/14/2012   Family history of early CAD 11/14/2012   Overactive bladder 04/12/2012   Hemorrhoids 01/08/2010   Essential hypertension, benign 01/08/2010   Osteoarthritis of right hip 01/08/2010    PCP: Margaretann Loveless, MD REFERRING PROVIDER: Janice Coffin, PA-C  REFERRING DIAG: G62.9 (ICD-10-CM) - Polyneuropathy, unspecified R26.89 (ICD-10-CM) - Other abnormalities of gait and mobility  THERAPY DIAG:  Unsteadiness on feet  Difficulty in walking, not elsewhere classified  Other abnormalities of gait and mobility  Dizziness and giddiness  ONSET DATE: worse past 6 months  Rationale for Evaluation and Treatment: Rehabilitation  SUBJECTIVE:   SUBJECTIVE STATEMENT: Noting continued symptoms with turning/rotating right vs left Pt accompanied by: self  PERTINENT HISTORY: Vertigo, several minutes, sudden onset in supine position Sudden onset vertigo in the supine position 09/15/2023, resolved. Headache  this morning, resolved by time of appointment. R TKR, left knee OA  PAIN:  Are you having pain? No  PRECAUTIONS: Fall  RED FLAGS: None   WEIGHT BEARING RESTRICTIONS: No  FALLS: Has patient fallen in last 6 months? No  LIVING ENVIRONMENT: Lives with: lives with their spouse Lives in: House/apartment Stairs: No Has following equipment at home: Single point cane  PLOF: Independent with basic ADLs and Independent with household mobility without device  PATIENT GOALS:   OBJECTIVE:   TODAY'S TREATMENT: 10/07/23 Activity Comments  Vestibular assessment   Rolling right habituation   Chin retractions   Standing rows +scap retract 2x10 red band  Corner balance  HEP review, repeated on foam       PATIENT EDUCATION: Education details: assessment details, rationale of intervention, HEP initiatoin Person educated: Patient Education method: Explanation and Handouts Education comprehension: verbalized understanding  HOME EXERCISE PROGRAM: Access Code: [email or web address] URL: [email or web address] Date: 09/30/2023 Prepared by: Shary Decamp  Exercises - Corner Balance Feet Together With Eyes Open  - 1 x daily - 7 x  weekly - 3 sets - 30 sec hold - Corner Balance Feet Together With Eyes Closed  - 1 x daily - 7 x weekly - 3 sets - 30 sec hold - Corner Balance Feet Together: Eyes Open With Head Turns  - 1 x daily - 7 x weekly - 3 sets - 3 reps - Corner Balance Feet Together: Eyes Closed With Head Turns  - 1 x daily - 7 x weekly - 3 sets - 3 reps - Supine to Right Sidelying Vestibular Habituation  - 1 x daily - 7 x weekly - 5-10 reps - Seated Cervical Retraction  - 1 x daily - 7 x weekly - 2-5 sec hold - Seated Shoulder Row with Anchored Resistance  - 1 x daily - 7 x weekly - 3 sets - 10 reps   VESTIBULAR ASSESSMENT   GENERAL OBSERVATION: wears progressive lens bifocals, holds head in left lateral tilt    SYMPTOM BEHAVIOR:   Subjective history: hx of vertigo, reports  feeling of fullness in right ear over the past week   Non-Vestibular symptoms: headaches   Type of dizziness: Spinning/Vertigo   Frequency: daily   Duration: seconds/minutes   Aggravating factors: Induced by position change: rolling to the right   Relieving factors: slow movements   Progression of symptoms: unchanged   OCULOMOTOR EXAM:   Ocular Alignment: normal   Ocular ROM: No Limitations   Spontaneous Nystagmus: absent   Gaze-Induced Nystagmus: absent   Smooth Pursuits: intact   Saccades: intact   Convergence/Divergence: 8 cm      VESTIBULAR - OCULAR REFLEX:    Slow VOR: Normal   VOR Cancellation: Normal   Head-Impulse Test: HIT Right: negative HIT Left: negative   Dynamic Visual Acuity: Not able to be assessed      POSITIONAL TESTING: Right Dix-Hallpike: no nystagmus Left Dix-Hallpike: no nystagmus Right Roll Test:  no nystagmus but reports brief spinning sensation Left Roll Test: no nystagmus      Note: Objective measures were completed at Evaluation unless otherwise noted.  DIAGNOSTIC FINDINGS: IMPRESSION: No acute intracranial abnormality.  COGNITION: Overall cognitive status: Within functional limits for tasks assessed   SENSATION: Not tested Reports stocking distribution of symptoms in her feet Pain in left hand > right EDEMA:  Slight in bilat ankles  MUSCLE TONE:  NT  DTRs:  NT  POSTURE:  No Significant postural limitations  Cervical ROM:    Globally limited ROM extension > lateral flexion > rotation  STRENGTH:   LOWER EXTREMITY MMT:   BLE gross 4/5 strength  BED MOBILITY:  indep  TRANSFERS: Assistive device utilized: None  Sit to stand: Complete Independence Stand to sit: Complete Independence Chair to chair: Complete Independence Floor:  NT  RAMP: NT  CURB: Modified independence  GAIT: Gait pattern:  very limited left knee flexion during gait due to antalgia left knee OA Distance walked:  Assistive device utilized:  Single point cane and None Level of assistance: Modified independence Comments:   FUNCTIONAL TESTS:  5 times sit to stand: 22.47 sec Dynamic Gait Index: 10/24  M-CTSIB  Condition 1: Firm Surface, EO 30 Sec, Normal Sway  Condition 2: Firm Surface, EC 30 Sec, Mild Sway  Condition 3: Foam Surface, EO 30 Sec, Mild Sway  Condition 4: Foam Surface, EC 30 Sec, Moderate Sway        GOALS: Goals reviewed with patient? Yes  SHORT TERM GOALS: Target date: same as LTG    LONG TERM GOALS: Target date: 10/28/2023  The patient will be independent with HEP for gaze adaptation, habituation, balance, and general mobility. Baseline:  Goal status: IN PROGRESS  2.  Demo improved postural stability per normal-mild sway condition 4 M-CTSIB to improve safety with ADL Baseline:  Goal status: IN PROGRESS  3.  Reduce risk for falls per score 19/24 Dynamic Gait Index Baseline: 10/24 Goal status: INITIAL  4.  Pt to report absence of positional dizziness to improve comfort and reduce risk for falls during mobility Baseline: TBD Goal status: INITIAL    ASSESSMENT:  CLINICAL IMPRESSION: Notes main provocation of dizziness is with head/body turns right, rolling right.  Vestibular exam unrevealing except for provocation of symptoms with rolling right. Addressed with HEP to include right rolling for habituation.  Exhibits postural dysfunction with limited cervical spine ROM and maintaining head in left lateral tilt and reports deegree of right shoulder discomfort with elevation and weakness vs LUE revealing 3+/5 right scaption vs left 5/5 and some tenderness to palpation of biceps tendon proximally. Provided with resisted rows to accompany postural re-education to improve alignment and awareness for midline orientation.  Review of previous corner balance HEP activities to improve static standing and incorporated compliant surfaces after initial review demonstrating mild-moderate sway under these  conditions.  Continued sessions to address postural deviations, dizziness/imbalance, and unsteadiness on feet.  OBJECTIVE IMPAIRMENTS: Abnormal gait, decreased activity tolerance, decreased balance, difficulty walking, decreased ROM, decreased strength, dizziness, increased muscle spasms, improper body mechanics, and pain.   ACTIVITY LIMITATIONS: carrying, lifting, stairs, bed mobility, reach over head, and locomotion level  PARTICIPATION LIMITATIONS: meal prep, cleaning, interpersonal relationship, shopping, and exercise  PERSONAL FACTORS: Age, Time since onset of injury/illness/exacerbation, and 3+ comorbidities: PMH  are also affecting patient's functional outcome.   REHAB POTENTIAL: Excellent  CLINICAL DECISION MAKING: Evolving/moderate complexity  EVALUATION COMPLEXITY: Moderate   PLAN:  PT FREQUENCY: 1x/week  PT DURATION: 4 weeks  PLANNED INTERVENTIONS: 97110-Therapeutic exercises, 97530- Therapeutic activity, 97112- Neuromuscular re-education, 97535- Self Care, 91478- Manual therapy, [phone]- Gait training, [phone]- Canalith repositioning, [phone]- Aquatic Therapy, Patient/Family education, Balance training, Stair training, Joint mobilization, Spinal mobilization, Vestibular training, and DME instructions  PLAN FOR NEXT SESSION: HEP review and multi-sensory progressions   11:09 AM, 10/07/23 M. Shary Decamp, PT, DPT Physical Therapist- [place] Office Number: [phone]

## 2023-10-13 NOTE — Therapy (Signed)
OUTPATIENT PHYSICAL THERAPY VESTIBULAR TREATMENT     Patient Name: Christina Mcclain MRN: [id number] DOB:16-Aug-1956, 67 y.o., female Today's Date: 10/14/2023  END OF SESSION:  PT End of Session - 10/14/23 1055     Visit Number 3    Number of Visits 5    Date for PT Re-Evaluation 10/28/23    Authorization Type [employer]    Authorization Time Period approved 5 PT visits from 09/30/2023 - 10/28/2023    PT Start Time 1018    PT Stop Time 1054    PT Time Calculation (min) 36 min    Activity Tolerance Patient tolerated treatment well    Behavior During Therapy WFL for tasks assessed/performed              Past Medical History:  Diagnosis Date   Adverse effect of anesthesia    hard to awaken   Anemia    Arthritis    Asthma    Asthma without status asthmaticus    unspecified; Take advair each night   Breast cancer (HCC) 2021   right breast IMC   Bronchitis    Chronic kidney disease    stage III per dr Cherylann Ratel   Constipation    Diabetes (HCC) 2010   Diabetes mellitus type 2, uncomplicated (HCC)    Diabetes mellitus without complication (HCC)    Encounter for blood transfusion    hysterectomy 9-10 years ago   Heart murmur    unspecified   Hyperlipidemia    Hypertension 1991   Hypothyroid    Multiple thyroid nodules    Neuromuscular disorder (HCC)    neuropathy   Osteoarthritis of right hip 02/04/2017   Palpitations    Pernicious anemia    Personal history of chemotherapy 2020-2021   Right breast ca   Personal history of radiation therapy 2021   right breast [facility]   Pneumonia    Sleep apnea    Use C-PAP; can't use mouth piece. Anxiety   Thyroid disease    nodules   Thyroid nodule    x2   Urinary urgency    Past Surgical History:  Procedure Laterality Date   ABDOMINAL HYSTERECTOMY  2011   partial   ABDOMINAL SURGERY     BREAST BIOPSY Right 10/02/2020   affirm bx, x marker, fat necrosis   BREAST BIOPSY Right 10/19/2019   two masses at 9:00 [place]  bx, coil marker and venus marker, IMC for both   BREAST LUMPECTOMY Right 03/28/2020   bracketing of two IMC masses at 9:00.  Venus marker was pushed medially during wire loc. Remains in breast.  Clear surgical margins. Negative Lymph node   CARPAL TUNNEL RELEASE Left 2008   CARPAL TUNNEL RELEASE Right    CHOLECYSTECTOMY  1982   COLONOSCOPY WITH PROPOFOL N/A 10/05/2017   Procedure: COLONOSCOPY WITH PROPOFOL;  Surgeon: Scot Jun, MD;  Location: [facility] ENDOSCOPY;  Service: Endoscopy;  Laterality: N/A;   COLONOSCOPY WITH PROPOFOL N/A 08/09/2021   Procedure: COLONOSCOPY WITH PROPOFOL;  Surgeon: Jaynie Collins, DO;  Location: [facility] ENDOSCOPY;  Service: Gastroenterology;  Laterality: N/A;   DIAGNOSTIC LAPAROSCOPY     ESOPHAGOGASTRODUODENOSCOPY (EGD) WITH PROPOFOL N/A 08/09/2021   Procedure: ESOPHAGOGASTRODUODENOSCOPY (EGD) WITH PROPOFOL;  Surgeon: Jaynie Collins, DO;  Location: [facility] ENDOSCOPY;  Service: Gastroenterology;  Laterality: N/A;   JOINT REPLACEMENT Right 02/09/2017   TOTAL HIP REPLACEMENT, DR. CARTER. [place]   KNEE ARTHROSCOPY  1990s   PARTIAL MASTECTOMY WITH NEEDLE LOCALIZATION AND AXILLARY SENTINEL LYMPH NODE BX  Right 03/28/2020   Procedure: PARTIAL MASTECTOMY WITH NEEDLE LOCALIZATION AND AXILLARY SENTINEL LYMPH NODE BX;  Surgeon: Carolan Shiver, MD;  Location: [facility] ORS;  Service: General;  Laterality: Right;   PORTACATH PLACEMENT Left 11/07/2019   Procedure: INSERTION PORT-A-CATH;  Surgeon: Carolan Shiver, MD;  Location: [facility] ORS;  Service: General;  Laterality: Left;   TOTAL HIP ARTHROPLASTY Left 06/23/2017   Procedure: TOTAL HIP ARTHROPLASTY ANTERIOR APPROACH;  Surgeon: Kennedy Bucker, MD;  Location: [facility] ORS;  Service: Orthopedics;  Laterality: Left;   Patient Active Problem List   Diagnosis Date Noted   SOB (shortness of breath) 08/11/2023   Urinary tract infection without hematuria 04/09/2023   Mixed hyperlipidemia 04/09/2023   Rash of face  03/17/2023   Absolute anemia 03/17/2023   Palpitations 11/09/2019   Pelvic pressure in female 11/09/2019   Invasive carcinoma of breast (HCC) 10/31/2019   Goals of care, counseling/discussion 10/31/2019   Nonintractable headache 08/03/2019   Tinnitus of both ears 08/03/2019   Bilateral hand numbness 09/14/2018   Bilateral hand pain 09/14/2018   Myalgia due to statin 08/26/2018   BMI 40.0-44.9, adult (HCC) 12/16/2017   Primary localized osteoarthrosis of left hip 06/23/2017   Controlled type 2 diabetes mellitus with complication, without long-term current use of insulin (HCC) 03/17/2017   Pernicious anemia 03/17/2017   Primary osteoarthritis involving multiple joints 03/17/2017   Status post right hip replacement 02/20/2017   At high risk for falls 02/11/2017   Mild persistent asthma without complication 02/11/2017   Impaired mobility and ADLs 02/11/2017   Myoclonus 07/23/2016   DISH (diffuse idiopathic skeletal hyperostosis) 07/23/2016   Obstructive sleep apnea syndrome 07/23/2016   Thyromegaly 07/23/2016   Left thyroid nodule 09/04/2014   Chest pain, unspecified 11/14/2012   High cholesterol 11/14/2012   Gastro-esophageal reflux disease without esophagitis 11/14/2012   Family history of early CAD 11/14/2012   Overactive bladder 04/12/2012   Hemorrhoids 01/08/2010   Essential hypertension, benign 01/08/2010   Osteoarthritis of right hip 01/08/2010    PCP: Margaretann Loveless, MD REFERRING PROVIDER: Janice Coffin, PA-C  REFERRING DIAG: G62.9 (ICD-10-CM) - Polyneuropathy, unspecified R26.89 (ICD-10-CM) - Other abnormalities of gait and mobility  THERAPY DIAG:  Unsteadiness on feet  Difficulty in walking, not elsewhere classified  Other abnormalities of gait and mobility  Dizziness and giddiness  ONSET DATE: worse past 6 months  Rationale for Evaluation and Treatment: Rehabilitation  SUBJECTIVE:   SUBJECTIVE STATEMENT: Doing okay. Nothing new. Reports that she did all  her HEP except for the rolling.  Pt accompanied by: self  PERTINENT HISTORY: Vertigo, several minutes, sudden onset in supine position Sudden onset vertigo in the supine position 09/15/2023, resolved. Headache this morning, resolved by time of appointment. R TKR, left knee OA  PAIN:  Are you having pain? Yes: NPRS scale: 5/10 Pain location: B LEs and knees Pain description: sore, achy Aggravating factors: rain Relieving factors: -  PRECAUTIONS: Fall  RED FLAGS: None   WEIGHT BEARING RESTRICTIONS: No  FALLS: Has patient fallen in last 6 months? No  LIVING ENVIRONMENT: Lives with: lives with their spouse Lives in: House/apartment Stairs: No Has following equipment at home: Single point cane  PLOF: Independent with basic ADLs and Independent with household mobility without device  PATIENT GOALS:   OBJECTIVE:     TODAY'S TREATMENT: 10/14/23 Activity Comments     R/L rolling 1x EO, 1x EC Report of "eyes difficulty focusing" upon return to midline. C/o "delay" with EC rolling R   Francee Piccolo daroff  R/L EO 1x Appeared to be a few beats of torsional nystagmus on R; pt reports dizziness upon lying down and sitting up   R DH R upbeating torsional nystagmus lasting ~15 sec  R Epley  Tolerated well but neck extension very limited; c/o dizziness upon sitting up  R DH  Less intense few beats R upbeating torsional nystagmus   R Epley  Tolerated well but neck extension very limited; c/o dizziness upon sitting up  R DH Less intense few beats R upbeating torsional nystagmus        PATIENT EDUCATION: Education details: edu on BPPV and recommended treatment course and benefits of habituation; HEP update, edu on post-CRM expectations Person educated: Patient Education method: Explanation, Demonstration, Tactile cues, Verbal cues, and Handouts Education comprehension: verbalized understanding and returned demonstration    HOME EXERCISE PROGRAM: Access Code: [email or web address] URL:  [email or web address] Date: 10/14/2023 Prepared by: [facility] - Outpatient  Rehab - [facility]  Exercises - Corner Balance Feet Together With Eyes Open  - 1 x daily - 7 x weekly - 3 sets - 30 sec hold - Corner Balance Feet Together With Eyes Closed  - 1 x daily - 7 x weekly - 3 sets - 30 sec hold - Corner Balance Feet Together: Eyes Open With Head Turns  - 1 x daily - 7 x weekly - 3 sets - 3 reps - Corner Balance Feet Together: Eyes Closed With Head Turns  - 1 x daily - 7 x weekly - 3 sets - 3 reps - Seated Cervical Retraction  - 1 x daily - 7 x weekly - 2-5 sec hold - Seated Shoulder Row with Anchored Resistance  - 1 x daily - 7 x weekly - 3 sets - 10 reps - Supine to Right Sidelying Vestibular Habituation  - 1 x daily - 7 x weekly - 5-10 reps - Brandt-Daroff Vestibular Exercise  - 1 x daily - 5 x weekly - 2 sets - 3-5 reps   10/07/23 VESTIBULAR ASSESSMENT   GENERAL OBSERVATION: wears progressive lens bifocals, holds head in left lateral tilt    SYMPTOM BEHAVIOR:   Subjective history: hx of vertigo, reports feeling of fullness in right ear over the past week   Non-Vestibular symptoms: headaches   Type of dizziness: Spinning/Vertigo   Frequency: daily   Duration: seconds/minutes   Aggravating factors: Induced by position change: rolling to the right   Relieving factors: slow movements   Progression of symptoms: unchanged   OCULOMOTOR EXAM:   Ocular Alignment: normal   Ocular ROM: No Limitations   Spontaneous Nystagmus: absent   Gaze-Induced Nystagmus: absent   Smooth Pursuits: intact   Saccades: intact   Convergence/Divergence: 8 cm      VESTIBULAR - OCULAR REFLEX:    Slow VOR: Normal   VOR Cancellation: Normal   Head-Impulse Test: HIT Right: negative HIT Left: negative   Dynamic Visual Acuity: Not able to be assessed      POSITIONAL TESTING: Right Dix-Hallpike: no nystagmus Left Dix-Hallpike: no nystagmus Right Roll Test:  no nystagmus but  reports brief spinning sensation Left Roll Test: no nystagmus      Note: Objective measures were completed at Evaluation unless otherwise noted.  DIAGNOSTIC FINDINGS: IMPRESSION: No acute intracranial abnormality.  COGNITION: Overall cognitive status: Within functional limits for tasks assessed   SENSATION: Not tested Reports stocking distribution of symptoms in her feet Pain in left hand > right EDEMA:  Slight in bilat ankles  MUSCLE TONE:  NT  DTRs:  NT  POSTURE:  No Significant postural limitations  Cervical ROM:    Globally limited ROM extension > lateral flexion > rotation  STRENGTH:   LOWER EXTREMITY MMT:   BLE gross 4/5 strength  BED MOBILITY:  indep  TRANSFERS: Assistive device utilized: None  Sit to stand: Complete Independence Stand to sit: Complete Independence Chair to chair: Complete Independence Floor:  NT  RAMP: NT  CURB: Modified independence  GAIT: Gait pattern:  very limited left knee flexion during gait due to antalgia left knee OA Distance walked:  Assistive device utilized: Single point cane and None Level of assistance: Modified independence Comments:   FUNCTIONAL TESTS:  5 times sit to stand: 22.47 sec Dynamic Gait Index: 10/24  M-CTSIB  Condition 1: Firm Surface, EO 30 Sec, Normal Sway  Condition 2: Firm Surface, EC 30 Sec, Mild Sway  Condition 3: Foam Surface, EO 30 Sec, Mild Sway  Condition 4: Foam Surface, EC 30 Sec, Moderate Sway        GOALS: Goals reviewed with patient? Yes  SHORT TERM GOALS: Target date: same as LTG    LONG TERM GOALS: Target date: 10/28/2023    The patient will be independent with HEP for gaze adaptation, habituation, balance, and general mobility. Baseline:  Goal status: IN PROGRESS  2.  Demo improved postural stability per normal-mild sway condition 4 M-CTSIB to improve safety with ADL Baseline:  Goal status: IN PROGRESS  3.  Reduce risk for falls per score 19/24 Dynamic Gait  Index Baseline: 10/24 Goal status: IN PROGRESS  4.  Pt to report absence of positional dizziness to improve comfort and reduce risk for falls during mobility Baseline: TBD Goal status: IN PROGRESS    ASSESSMENT:  CLINICAL IMPRESSION: Patient arrived to session with report of some LE pain this AM without other complaints. Reassessed positional testing which revealed R posterior canalithiasis. Patient tolerated R Epley x2 with improvement in symptoms, not full resolution (likely d/t limited neck ROM). Educated on habituation to address remaining symptoms. Patient tolerated session well and without complaints upon leaving.   OBJECTIVE IMPAIRMENTS: Abnormal gait, decreased activity tolerance, decreased balance, difficulty walking, decreased ROM, decreased strength, dizziness, increased muscle spasms, improper body mechanics, and pain.   ACTIVITY LIMITATIONS: carrying, lifting, stairs, bed mobility, reach over head, and locomotion level  PARTICIPATION LIMITATIONS: meal prep, cleaning, interpersonal relationship, shopping, and exercise  PERSONAL FACTORS: Age, Time since onset of injury/illness/exacerbation, and 3+ comorbidities: PMH  are also affecting patient's functional outcome.   REHAB POTENTIAL: Excellent  CLINICAL DECISION MAKING: Evolving/moderate complexity  EVALUATION COMPLEXITY: Moderate   PLAN:  PT FREQUENCY: 1x/week  PT DURATION: 4 weeks  PLANNED INTERVENTIONS: 97110-Therapeutic exercises, 97530- Therapeutic activity, 97112- Neuromuscular re-education, 97535- Self Care, 16109- Manual therapy, [phone]- Gait training, [phone]- Canalith repositioning, [phone]- Aquatic Therapy, Patient/Family education, Balance training, Stair training, Joint mobilization, Spinal mobilization, Vestibular training, and DME instructions  PLAN FOR NEXT SESSION: reassess R DH;  HEP review and multi-sensory progressions   Anette Guarneri, PT, DPT 10/14/23 11:03 AM  [facility]  at [facility] [address], [address] [place], [place] [address] Phone # [phone] Fax # [phone]

## 2023-10-14 ENCOUNTER — Encounter: Payer: Self-pay | Admitting: Physical Therapy

## 2023-10-14 ENCOUNTER — Ambulatory Visit: Payer: Medicare PPO | Admitting: Physical Therapy

## 2023-10-14 DIAGNOSIS — R42 Dizziness and giddiness: Secondary | ICD-10-CM | POA: Diagnosis not present

## 2023-10-14 DIAGNOSIS — R2681 Unsteadiness on feet: Secondary | ICD-10-CM

## 2023-10-14 DIAGNOSIS — R2689 Other abnormalities of gait and mobility: Secondary | ICD-10-CM

## 2023-10-14 DIAGNOSIS — R262 Difficulty in walking, not elsewhere classified: Secondary | ICD-10-CM

## 2023-10-16 ENCOUNTER — Ambulatory Visit
Admission: RE | Admit: 2023-10-16 | Discharge: 2023-10-16 | Disposition: A | Discharge status: Home / Self Care | Payer: Medicare PPO | Source: Ambulatory Visit | Attending: Oncology

## 2023-10-16 ENCOUNTER — Encounter: Payer: Self-pay | Admitting: Internal Medicine

## 2023-10-16 ENCOUNTER — Ambulatory Visit
Admission: RE | Admit: 2023-10-16 | Discharge: 2023-10-16 | Disposition: A | Discharge status: Home / Self Care | Payer: Medicare PPO | Source: Ambulatory Visit | Attending: Oncology | Admitting: Oncology

## 2023-10-16 ENCOUNTER — Ambulatory Visit (INDEPENDENT_AMBULATORY_CARE_PROVIDER_SITE_OTHER): Payer: Medicare PPO | Admitting: Internal Medicine

## 2023-10-16 ENCOUNTER — Other Ambulatory Visit: Payer: Medicare PPO

## 2023-10-16 VITALS — BP 180/90 | HR 68 | Ht 62.0 in | Wt 216.8 lb

## 2023-10-16 DIAGNOSIS — D508 Other iron deficiency anemias: Secondary | ICD-10-CM | POA: Diagnosis not present

## 2023-10-16 DIAGNOSIS — D649 Anemia, unspecified: Secondary | ICD-10-CM

## 2023-10-16 DIAGNOSIS — E1159 Type 2 diabetes mellitus with other circulatory complications: Secondary | ICD-10-CM | POA: Diagnosis not present

## 2023-10-16 DIAGNOSIS — R921 Mammographic calcification found on diagnostic imaging of breast: Secondary | ICD-10-CM | POA: Diagnosis not present

## 2023-10-16 DIAGNOSIS — E1169 Type 2 diabetes mellitus with other specified complication: Secondary | ICD-10-CM | POA: Diagnosis not present

## 2023-10-16 DIAGNOSIS — N1832 Chronic kidney disease, stage 3b: Secondary | ICD-10-CM | POA: Diagnosis not present

## 2023-10-16 DIAGNOSIS — I152 Hypertension secondary to endocrine disorders: Secondary | ICD-10-CM | POA: Diagnosis not present

## 2023-10-16 DIAGNOSIS — R92323 Mammographic fibroglandular density, bilateral breasts: Secondary | ICD-10-CM | POA: Diagnosis not present

## 2023-10-16 DIAGNOSIS — E1122 Type 2 diabetes mellitus with diabetic chronic kidney disease: Secondary | ICD-10-CM | POA: Diagnosis not present

## 2023-10-16 DIAGNOSIS — Z1239 Encounter for other screening for malignant neoplasm of breast: Secondary | ICD-10-CM | POA: Diagnosis not present

## 2023-10-16 DIAGNOSIS — N644 Mastodynia: Secondary | ICD-10-CM | POA: Diagnosis not present

## 2023-10-16 DIAGNOSIS — E118 Type 2 diabetes mellitus with unspecified complications: Secondary | ICD-10-CM

## 2023-10-16 DIAGNOSIS — E782 Mixed hyperlipidemia: Secondary | ICD-10-CM | POA: Diagnosis not present

## 2023-10-16 DIAGNOSIS — E559 Vitamin D deficiency, unspecified: Secondary | ICD-10-CM | POA: Diagnosis not present

## 2023-10-16 DIAGNOSIS — I10 Essential (primary) hypertension: Secondary | ICD-10-CM | POA: Diagnosis not present

## 2023-10-16 LAB — GLUCOSE, POCT (MANUAL RESULT ENTRY): POC Glucose: 98 mg/dL (ref 70–99)

## 2023-10-16 NOTE — Progress Notes (Signed)
Established Patient Office Visit  Subjective:  Patient ID: Christina Mcclain, female    DOB: 07/01/1956  Age: 67 y.o. MRN: [id number]  Chief Complaint  Patient presents with   Follow-up    3 mo    Patient is here for her follow-up.  But her blood pressure is very high at 180/90. Patient admits that she has not taken her medications this morning, as she had to get her fasting labs today and had a mammogram appointment. She is currently having some headache and is feeling tired.  Denies chest pain or shortness of breath. Patient advised to go home take her medications and rest.  She has a follow-up with a nephrologist in early next week. Will return here for her annual wellness visit in 1 week.    No other concerns at this time.   Past Medical History:  Diagnosis Date   Adverse effect of anesthesia    hard to awaken   Anemia    Arthritis    Asthma    Asthma without status asthmaticus    unspecified; Take advair each night   Breast cancer (HCC) 2021   right breast IMC   Bronchitis    Chronic kidney disease    stage III per dr Cherylann Ratel   Constipation    Diabetes (HCC) 2010   Diabetes mellitus type 2, uncomplicated (HCC)    Diabetes mellitus without complication (HCC)    Encounter for blood transfusion    hysterectomy 9-10 years ago   Heart murmur    unspecified   Hyperlipidemia    Hypertension 1991   Hypothyroid    Multiple thyroid nodules    Neuromuscular disorder (HCC)    neuropathy   Osteoarthritis of right hip 02/04/2017   Palpitations    Pernicious anemia    Personal history of chemotherapy 2020-2021   Right breast ca   Personal history of radiation therapy 2021   right breast [facility]   Pneumonia    Sleep apnea    Use C-PAP; can't use mouth piece. Anxiety   Thyroid disease    nodules   Thyroid nodule    x2   Urinary urgency     Past Surgical History:  Procedure Laterality Date   ABDOMINAL HYSTERECTOMY  2011   partial   ABDOMINAL SURGERY     BREAST  BIOPSY Right 10/02/2020   affirm bx, x marker, fat necrosis   BREAST BIOPSY Right 10/19/2019   two masses at 9:00 [place] bx, coil marker and venus marker, IMC for both   BREAST LUMPECTOMY Right 03/28/2020   bracketing of two IMC masses at 9:00.  Venus marker was pushed medially during wire loc. Remains in breast.  Clear surgical margins. Negative Lymph node   CARPAL TUNNEL RELEASE Left 2008   CARPAL TUNNEL RELEASE Right    CHOLECYSTECTOMY  1982   COLONOSCOPY WITH PROPOFOL N/A 10/05/2017   Procedure: COLONOSCOPY WITH PROPOFOL;  Surgeon: Scot Jun, MD;  Location: [facility] ENDOSCOPY;  Service: Endoscopy;  Laterality: N/A;   COLONOSCOPY WITH PROPOFOL N/A 08/09/2021   Procedure: COLONOSCOPY WITH PROPOFOL;  Surgeon: Jaynie Collins, DO;  Location: [facility] ENDOSCOPY;  Service: Gastroenterology;  Laterality: N/A;   DIAGNOSTIC LAPAROSCOPY     ESOPHAGOGASTRODUODENOSCOPY (EGD) WITH PROPOFOL N/A 08/09/2021   Procedure: ESOPHAGOGASTRODUODENOSCOPY (EGD) WITH PROPOFOL;  Surgeon: Jaynie Collins, DO;  Location: [facility] ENDOSCOPY;  Service: Gastroenterology;  Laterality: N/A;   JOINT REPLACEMENT Right 02/09/2017   TOTAL HIP REPLACEMENT, DR. CARTER. [place]   KNEE ARTHROSCOPY  1990s   PARTIAL MASTECTOMY WITH NEEDLE LOCALIZATION AND AXILLARY SENTINEL LYMPH NODE BX Right 03/28/2020   Procedure: PARTIAL MASTECTOMY WITH NEEDLE LOCALIZATION AND AXILLARY SENTINEL LYMPH NODE BX;  Surgeon: Carolan Shiver, MD;  Location: [facility] ORS;  Service: General;  Laterality: Right;   PORTACATH PLACEMENT Left 11/07/2019   Procedure: INSERTION PORT-A-CATH;  Surgeon: Carolan Shiver, MD;  Location: [facility] ORS;  Service: General;  Laterality: Left;   TOTAL HIP ARTHROPLASTY Left 06/23/2017   Procedure: TOTAL HIP ARTHROPLASTY ANTERIOR APPROACH;  Surgeon: Kennedy Bucker, MD;  Location: [facility] ORS;  Service: Orthopedics;  Laterality: Left;    Social History   Socioeconomic History   Marital status: Married    Spouse name:  Not on file   Number of children: 2   Years of education: some college   Highest education level: Not on file  Occupational History   Occupation: retired    Comment: [employer]  Tobacco Use   Smoking status: Never   Smokeless tobacco: Never  Vaping Use   Vaping status: Never Used  Substance and Sexual Activity   Alcohol use: No   Drug use: No   Sexual activity: Not Currently  Other Topics Concern   Not on file  Social History Narrative   Not on file   Social Determinants of Health   Financial Resource Strain: Low Risk  (10/05/2023)   Received from [facility]   Overall Financial Resource Strain (CARDIA)    Difficulty of Paying Living Expenses: Not hard at all  Food Insecurity: No Food Insecurity (10/05/2023)   Received from [facility]   Hunger Vital Sign    Worried About Running Out of Food in the Last Year: Never true    Ran Out of Food in the Last Year: Never true  Transportation Needs: No Transportation Needs (10/05/2023)   Received from [facility]    In the past 12 months, has lack of transportation kept you from medical appointments or from getting medications?: No    Lack of Transportation (Non-Medical): No  Physical Activity: Not on file  Stress: Not on file  Social Connections: Not on file  Intimate Partner Violence: Not on file    Family History  Problem Relation Age of Onset   Heart disease Mother    Hypertension Mother    Arrhythmia Mother    Heart attack Mother    Diabetes Mother    Hypertension Father    Parkinson's disease Father    Coronary artery disease Maternal Uncle    Heart attack Maternal Uncle    Diabetes Sister    Diabetes Brother    Heart disease Brother    Breast cancer Neg Hx     Allergies  Allergen Reactions   Amlodipine Other (See Comments)    edema   Statins Rash    Other reaction(s): Muscle Pain Possible hives also     Outpatient Medications Prior to Visit  Medication Sig Note   acetaminophen (TYLENOL) 500 MG tablet Take 500 mg by mouth every 6 (six) hours as needed.    albuterol (PROVENTIL HFA;VENTOLIN HFA) 108 (90 Base) MCG/ACT inhaler Inhale 2 puffs into the lungs every 6 (six) hours as needed for wheezing or shortness of breath.    Ascorbic Acid (VITAMIN C) 1000 MG tablet Take 2 tablets by mouth daily.    CALCIUM CITRATE PO Take 1,000 mg by mouth daily.    carvedilol (COREG) 25 MG tablet  TAKE 1 TABLET BY MOUTH TWICE DAILY    Cholecalciferol (VITAMIN D-3) 125 MCG (5000 UT) TABS Take 2 tablets by mouth daily.    empagliflozin (JARDIANCE) 10 MG TABS tablet Take 1 tablet (10 mg total) by mouth daily before breakfast.    ezetimibe (ZETIA) 10 MG tablet TAKE 1 TABLET BY MOUTH EVERY DAY    Fluticasone-Salmeterol (ADVAIR) 250-50 MCG/DOSE AEPB Inhale 1 puff into the lungs 2 (two) times daily.     furosemide (LASIX) 20 MG tablet     hydrALAZINE (APRESOLINE) 25 MG tablet Take 1 tablet (25 mg total) by mouth 2 times daily at 12 noon and 4 pm.    loratadine (CLARITIN) 10 MG tablet Take 10 mg by mouth daily as needed for allergies.     losartan (COZAAR) 25 MG tablet  10/16/2023: Doseage changed by cardio to 50 mg   magnesium oxide (MAG-OX) 400 MG tablet Take 2 tablets by mouth daily.    pantoprazole (PROTONIX) 40 MG tablet Take 40 mg by mouth daily.    pregabalin (LYRICA) 50 MG capsule TAKE ONE CAPSULE BY MOUTH TWICE DAILY    solifenacin (VESICARE) 10 MG tablet Take 10 mg by mouth every evening.    cholecalciferol (VITAMIN D) 1000 units tablet Take 2,000 Units by mouth daily. (Patient not taking: Reported on 10/16/2023)    [DISCONTINUED] fluticasone-salmeterol (WIXELA INHUB) 250-50 MCG/ACT AEPB Inhale 1 puff into the lungs in the morning and at bedtime.    [DISCONTINUED] lidocaine-prilocaine (EMLA) cream APPLY 1 APPLICATION TOPICALLY AS NEEDED (Patient not taking: Reported on 04/09/2023)    [DISCONTINUED] meloxicam  (MOBIC) 15 MG tablet Take 15 mg by mouth daily. (Patient not taking: Reported on 07/28/2023)    [DISCONTINUED] methylPREDNISolone (MEDROL DOSEPAK) 4 MG TBPK tablet Use as directed    [DISCONTINUED] traMADol (ULTRAM) 50 MG tablet Take 1 tablet (50 mg total) by mouth every 6 (six) hours as needed. (Patient not taking: Reported on 04/09/2023)    No facility-administered medications prior to visit.    Review of Systems  Constitutional:  Positive for malaise/fatigue. Negative for chills, diaphoresis, fever and weight loss.  HENT: Negative.  Negative for nosebleeds and sore throat.   Eyes: Negative.   Respiratory: Negative.  Negative for cough and shortness of breath.   Cardiovascular: Negative.  Negative for chest pain, palpitations and leg swelling.  Gastrointestinal: Negative.  Negative for abdominal pain, constipation, diarrhea, heartburn, nausea and vomiting.  Genitourinary: Negative.  Negative for dysuria and flank pain.  Musculoskeletal: Negative.  Negative for joint pain and myalgias.  Skin: Negative.   Neurological:  Positive for headaches. Negative for dizziness and tingling.  Endo/Heme/Allergies: Negative.   Psychiatric/Behavioral: Negative.  Negative for depression and suicidal ideas. The patient is not nervous/anxious.        Objective:   BP (!) 180/90   Pulse 68   Ht 5\' 2"  (1.575 m)   Wt 216 lb 12.8 oz (98.3 kg)   SpO2 91%   BMI 39.65 kg/m   Vitals:   10/16/23 1120  BP: (!) 180/90  Pulse: 68  Height: 5\' 2"  (1.575 m)  Weight: 216 lb 12.8 oz (98.3 kg)  SpO2: 91%  BMI (Calculated): 39.64    Physical Exam Vitals and nursing note reviewed.  Constitutional:      Appearance: Normal appearance.  HENT:     Head: Normocephalic and atraumatic.     Nose: Nose normal.     Mouth/Throat:     Mouth: Mucous membranes are moist.  Pharynx: Oropharynx is clear.  Eyes:     Conjunctiva/sclera: Conjunctivae normal.     Pupils: Pupils are equal, round, and reactive to light.   Cardiovascular:     Rate and Rhythm: Normal rate and regular rhythm.     Pulses: Normal pulses.     Heart sounds: Normal heart sounds. No murmur heard. Pulmonary:     Effort: Pulmonary effort is normal.     Breath sounds: Normal breath sounds. No wheezing.  Abdominal:     General: Bowel sounds are normal.     Palpations: Abdomen is soft.     Tenderness: There is no abdominal tenderness. There is no right CVA tenderness or left CVA tenderness.  Musculoskeletal:        General: Normal range of motion.     Cervical back: Normal range of motion.     Right lower leg: No edema.     Left lower leg: No edema.  Skin:    General: Skin is warm and dry.  Neurological:     General: No focal deficit present.     Mental Status: She is alert and oriented to person, place, and time.  Psychiatric:        Mood and Affect: Mood normal.        Behavior: Behavior normal.      Results for orders placed or performed in visit on 10/16/23  POCT Glucose (CBG)  Result Value Ref Range   POC Glucose 98 70 - 99 mg/dl    Recent Results (from the past 2160 hour(s))  POCT Glucose (CBG)     Status: Normal   Collection Time: 10/16/23 11:29 AM  Result Value Ref Range   POC Glucose 98 70 - 99 mg/dl      Assessment & Plan:  Patient advised to go home take and take her medications.  Rest.  Monitor blood pressure at home.  Patient will return for her annual wellness visit. Problem List Items Addressed This Visit     Type 2 diabetes mellitus with stage 3b chronic kidney disease, without long-term current use of insulin (HCC)   Hypertension associated with diabetes (HCC) - Primary   Combined hyperlipidemia associated with type 2 diabetes mellitus (HCC)    Return in about 10 days (around 10/26/2023).   Total time spent: 25 minutes  Margaretann Loveless, MD  10/16/2023   This document may have been prepared by [facility] Voice Recognition software and as such may include unintentional dictation errors.

## 2023-10-17 LAB — CMP14+EGFR
ALT: 11 [IU]/L (ref 0–32)
AST: 15 [IU]/L (ref 0–40)
Albumin: 4.2 g/dL (ref 3.9–4.9)
Alkaline Phosphatase: 120 [IU]/L (ref 44–121)
BUN/Creatinine Ratio: 20 (ref 12–28)
BUN: 28 mg/dL — ABNORMAL HIGH (ref 8–27)
Bilirubin Total: 0.3 mg/dL (ref 0.0–1.2)
CO2: 28 mmol/L (ref 20–29)
Calcium: 9.9 mg/dL (ref 8.7–10.3)
Chloride: 105 mmol/L (ref 96–106)
Creatinine, Ser: 1.42 mg/dL — ABNORMAL HIGH (ref 0.57–1.00)
Globulin, Total: 2.7 g/dL (ref 1.5–4.5)
Glucose: 90 mg/dL (ref 70–99)
Potassium: 4.5 mmol/L (ref 3.5–5.2)
Sodium: 145 mmol/L — ABNORMAL HIGH (ref 134–144)
Total Protein: 6.9 g/dL (ref 6.0–8.5)
eGFR: 40 mL/min/{1.73_m2} — ABNORMAL LOW (ref >59)

## 2023-10-17 LAB — CBC WITH DIFFERENTIAL/PLATELET
Basophils Absolute: 0.1 10*3/uL (ref 0.0–0.2)
Basos: 2 %
EOS (ABSOLUTE): 0.1 10*3/uL (ref 0.0–0.4)
Eos: 2 %
Hematocrit: 40.2 % (ref 34.0–46.6)
Hemoglobin: 12.1 g/dL (ref 11.1–15.9)
Immature Grans (Abs): 0 10*3/uL (ref 0.0–0.1)
Immature Granulocytes: 0 %
Lymphocytes Absolute: 1.8 10*3/uL (ref 0.7–3.1)
Lymphs: 44 %
MCH: 25.2 pg — ABNORMAL LOW (ref 26.6–33.0)
MCHC: 30.1 g/dL — ABNORMAL LOW (ref 31.5–35.7)
MCV: 84 fL (ref 79–97)
Monocytes Absolute: 0.5 10*3/uL (ref 0.1–0.9)
Monocytes: 12 %
Neutrophils Absolute: 1.6 10*3/uL (ref 1.4–7.0)
Neutrophils: 40 %
Platelets: 169 10*3/uL (ref 150–450)
RBC: 4.81 x10E6/uL (ref 3.77–5.28)
RDW: 14.7 % (ref 11.7–15.4)
WBC: 4 10*3/uL (ref 3.4–10.8)

## 2023-10-17 LAB — LIPID PANEL W/O CHOL/HDL RATIO
Cholesterol, Total: 187 mg/dL (ref 100–199)
HDL: 87 mg/dL (ref >39)
LDL Chol Calc (NIH): 88 mg/dL (ref 0–99)
Triglycerides: 64 mg/dL (ref 0–149)
VLDL Cholesterol Cal: 12 mg/dL (ref 5–40)

## 2023-10-17 LAB — VITAMIN D 25 HYDROXY (VIT D DEFICIENCY, FRACTURES): Vit D, 25-Hydroxy: 40.7 ng/mL (ref 30.0–100.0)

## 2023-10-17 LAB — HEMOGLOBIN A1C
Est. average glucose Bld gHb Est-mCnc: 151 mg/dL
Hgb A1c MFr Bld: 6.9 % — ABNORMAL HIGH (ref 4.8–5.6)

## 2023-10-19 DIAGNOSIS — I10 Essential (primary) hypertension: Secondary | ICD-10-CM | POA: Diagnosis not present

## 2023-10-19 DIAGNOSIS — D631 Anemia in chronic kidney disease: Secondary | ICD-10-CM | POA: Diagnosis not present

## 2023-10-19 DIAGNOSIS — N1832 Chronic kidney disease, stage 3b: Secondary | ICD-10-CM | POA: Diagnosis not present

## 2023-10-19 DIAGNOSIS — N2581 Secondary hyperparathyroidism of renal origin: Secondary | ICD-10-CM | POA: Diagnosis not present

## 2023-10-20 ENCOUNTER — Encounter: Payer: Self-pay | Admitting: Internal Medicine

## 2023-10-20 ENCOUNTER — Ambulatory Visit (INDEPENDENT_AMBULATORY_CARE_PROVIDER_SITE_OTHER): Payer: Medicare PPO | Admitting: Internal Medicine

## 2023-10-20 VITALS — BP 130/60 | HR 70 | Ht 62.0 in | Wt 219.6 lb

## 2023-10-20 DIAGNOSIS — G4733 Obstructive sleep apnea (adult) (pediatric): Secondary | ICD-10-CM | POA: Diagnosis not present

## 2023-10-20 DIAGNOSIS — E1122 Type 2 diabetes mellitus with diabetic chronic kidney disease: Secondary | ICD-10-CM

## 2023-10-20 DIAGNOSIS — K219 Gastro-esophageal reflux disease without esophagitis: Secondary | ICD-10-CM

## 2023-10-20 DIAGNOSIS — I152 Hypertension secondary to endocrine disorders: Secondary | ICD-10-CM | POA: Diagnosis not present

## 2023-10-20 DIAGNOSIS — E1159 Type 2 diabetes mellitus with other circulatory complications: Secondary | ICD-10-CM | POA: Diagnosis not present

## 2023-10-20 DIAGNOSIS — N1832 Chronic kidney disease, stage 3b: Secondary | ICD-10-CM

## 2023-10-20 DIAGNOSIS — E782 Mixed hyperlipidemia: Secondary | ICD-10-CM | POA: Diagnosis not present

## 2023-10-20 LAB — POC CREATINE & ALBUMIN,URINE
Creatinine, POC: 200 mg/dL
Microalbumin Ur, POC: 80 mg/L

## 2023-10-20 LAB — GLUCOSE, POCT (MANUAL RESULT ENTRY): POC Glucose: 105 mg/dL — AB (ref 70–99)

## 2023-10-20 NOTE — Therapy (Signed)
OUTPATIENT PHYSICAL THERAPY VESTIBULAR TREATMENT     Patient Name: Christina Mcclain MRN: [id number] DOB:1956/03/10, 67 y.o., female Today's Date: 10/21/2023  END OF SESSION:  PT End of Session - 10/21/23 1309     Visit Number 4    Number of Visits 5    Date for PT Re-Evaluation 10/28/23    Authorization Type [employer]    Authorization Time Period approved 5 PT visits from 09/30/2023 - 10/28/2023    PT Start Time 1235    PT Stop Time 1312    PT Time Calculation (min) 37 min    Activity Tolerance Patient tolerated treatment well    Behavior During Therapy WFL for tasks assessed/performed               Past Medical History:  Diagnosis Date   Adverse effect of anesthesia    hard to awaken   Anemia    Arthritis    Asthma    Asthma without status asthmaticus    unspecified; Take advair each night   Breast cancer (HCC) 2021   right breast IMC   Bronchitis    Chronic kidney disease    stage III per dr Cherylann Ratel   Constipation    Diabetes (HCC) 2010   Diabetes mellitus type 2, uncomplicated (HCC)    Diabetes mellitus without complication (HCC)    Encounter for blood transfusion    hysterectomy 9-10 years ago   Heart murmur    unspecified   Hyperlipidemia    Hypertension 1991   Hypothyroid    Multiple thyroid nodules    Neuromuscular disorder (HCC)    neuropathy   Osteoarthritis of right hip 02/04/2017   Palpitations    Pernicious anemia    Personal history of chemotherapy 2020-2021   Right breast ca   Personal history of radiation therapy 2021   right breast [facility]   Pneumonia    Sleep apnea    Use C-PAP; can't use mouth piece. Anxiety   Thyroid disease    nodules   Thyroid nodule    x2   Urinary urgency    Past Surgical History:  Procedure Laterality Date   ABDOMINAL HYSTERECTOMY  2011   partial   ABDOMINAL SURGERY     BREAST BIOPSY Right 10/02/2020   affirm bx, x marker, fat necrosis   BREAST BIOPSY Right 10/19/2019   two masses at 9:00 [place]  bx, coil marker and venus marker, IMC for both   BREAST LUMPECTOMY Right 03/28/2020   bracketing of two IMC masses at 9:00.  Venus marker was pushed medially during wire loc. Remains in breast.  Clear surgical margins. Negative Lymph node   CARPAL TUNNEL RELEASE Left 2008   CARPAL TUNNEL RELEASE Right    CHOLECYSTECTOMY  1982   COLONOSCOPY WITH PROPOFOL N/A 10/05/2017   Procedure: COLONOSCOPY WITH PROPOFOL;  Surgeon: Scot Jun, MD;  Location: [facility] ENDOSCOPY;  Service: Endoscopy;  Laterality: N/A;   COLONOSCOPY WITH PROPOFOL N/A 08/09/2021   Procedure: COLONOSCOPY WITH PROPOFOL;  Surgeon: Jaynie Collins, DO;  Location: [facility] ENDOSCOPY;  Service: Gastroenterology;  Laterality: N/A;   DIAGNOSTIC LAPAROSCOPY     ESOPHAGOGASTRODUODENOSCOPY (EGD) WITH PROPOFOL N/A 08/09/2021   Procedure: ESOPHAGOGASTRODUODENOSCOPY (EGD) WITH PROPOFOL;  Surgeon: Jaynie Collins, DO;  Location: [facility] ENDOSCOPY;  Service: Gastroenterology;  Laterality: N/A;   JOINT REPLACEMENT Right 02/09/2017   TOTAL HIP REPLACEMENT, DR. CARTER. [place]   KNEE ARTHROSCOPY  1990s   PARTIAL MASTECTOMY WITH NEEDLE LOCALIZATION AND AXILLARY SENTINEL LYMPH NODE  BX Right 03/28/2020   Procedure: PARTIAL MASTECTOMY WITH NEEDLE LOCALIZATION AND AXILLARY SENTINEL LYMPH NODE BX;  Surgeon: Carolan Shiver, MD;  Location: [facility] ORS;  Service: General;  Laterality: Right;   PORTACATH PLACEMENT Left 11/07/2019   Procedure: INSERTION PORT-A-CATH;  Surgeon: Carolan Shiver, MD;  Location: [facility] ORS;  Service: General;  Laterality: Left;   TOTAL HIP ARTHROPLASTY Left 06/23/2017   Procedure: TOTAL HIP ARTHROPLASTY ANTERIOR APPROACH;  Surgeon: Kennedy Bucker, MD;  Location: [facility] ORS;  Service: Orthopedics;  Laterality: Left;   Patient Active Problem List   Diagnosis Date Noted   Hypertension associated with diabetes (HCC) 10/16/2023   Combined hyperlipidemia associated with type 2 diabetes mellitus (HCC) 10/16/2023   SOB  (shortness of breath) 08/11/2023   Urinary tract infection without hematuria 04/09/2023   Mixed hyperlipidemia 04/09/2023   Rash of face 03/17/2023   Absolute anemia 03/17/2023   Palpitations 11/09/2019   Pelvic pressure in female 11/09/2019   Invasive carcinoma of breast (HCC) 10/31/2019   Goals of care, counseling/discussion 10/31/2019   Nonintractable headache 08/03/2019   Tinnitus of both ears 08/03/2019   Bilateral hand numbness 09/14/2018   Bilateral hand pain 09/14/2018   Myalgia due to statin 08/26/2018   BMI 40.0-44.9, adult (HCC) 12/16/2017   Primary localized osteoarthrosis of left hip 06/23/2017   Type 2 diabetes mellitus with stage 3b chronic kidney disease, without long-term current use of insulin (HCC) 03/17/2017   Pernicious anemia 03/17/2017   Primary osteoarthritis involving multiple joints 03/17/2017   Status post right hip replacement 02/20/2017   At high risk for falls 02/11/2017   Mild persistent asthma without complication 02/11/2017   Impaired mobility and ADLs 02/11/2017   Myoclonus 07/23/2016   DISH (diffuse idiopathic skeletal hyperostosis) 07/23/2016   Obstructive sleep apnea syndrome 07/23/2016   Thyromegaly 07/23/2016   Left thyroid nodule 09/04/2014   Chest pain, unspecified 11/14/2012   High cholesterol 11/14/2012   Gastro-esophageal reflux disease without esophagitis 11/14/2012   Family history of early CAD 11/14/2012   Overactive bladder 04/12/2012   Hemorrhoids 01/08/2010   Essential hypertension, benign 01/08/2010   Osteoarthritis of right hip 01/08/2010    PCP: Margaretann Loveless, MD REFERRING PROVIDER: Janice Coffin, PA-C  REFERRING DIAG: G62.9 (ICD-10-CM) - Polyneuropathy, unspecified R26.89 (ICD-10-CM) - Other abnormalities of gait and mobility  THERAPY DIAG:  Unsteadiness on feet  Difficulty in walking, not elsewhere classified  Other abnormalities of gait and mobility  Dizziness and giddiness  ONSET DATE: worse past 6  months  Rationale for Evaluation and Treatment: Rehabilitation  SUBJECTIVE:   SUBJECTIVE STATEMENT: Did her exercises "and it got better with each one, but then yesterday when I did it yesterday it was really bad." Pt accompanied by: self  PERTINENT HISTORY: Vertigo, several minutes, sudden onset in supine position Sudden onset vertigo in the supine position 09/15/2023, resolved. Headache this morning, resolved by time of appointment. R TKR, left knee OA  PAIN:  Are you having pain? Yes: NPRS scale: 2/10 Pain location: R sided headache Pain description: headache Aggravating factors: nothing Relieving factors: turning head a certain way  PRECAUTIONS: Fall  RED FLAGS: None   WEIGHT BEARING RESTRICTIONS: No  FALLS: Has patient fallen in last 6 months? No  LIVING ENVIRONMENT: Lives with: lives with their spouse Lives in: House/apartment Stairs: No Has following equipment at home: Single point cane  PLOF: Independent with basic ADLs and Independent with household mobility without device  PATIENT GOALS:   OBJECTIVE:    TODAY'S TREATMENT: 10/21/23  Activity Comments  R DH  R upbeating torsional nystagmus lasting ~20 sec, then small amplitude downbeating lasting ~ 7 sec  R universal repositioning maneuver  Tolerated well, however cervical ROM severely limiting   R DH  Delayed couple beats R upbeating torsional nystagmus; c/o delayed dizziness upon sitting up  R universal repositioning maneuver  Tolerated well  R sidelying test  Negative; however pt reported sensation of spinning and pulsion upon sitting up  R sidelying test  C/o mild dizziness upon lying down; no visible nystagmus   Palpation  Slightly TTP over R>L suboccpitals and severe restriction in R>L UT    PATIENT EDUCATION: Education details: discussed pt's neck ROM restriction and possible need to address it in PT in order to be more successful with CRM; provided names of massage therapy clinics in the area per  pt's request  Person educated: Patient Education method: Explanation, Demonstration, Tactile cues, Verbal cues, and Handouts Education comprehension: verbalized understanding and returned demonstration    HOME EXERCISE PROGRAM: Access Code: [email or web address] URL: [email or web address] Date: 10/14/2023 Prepared by: [facility] - Outpatient  Rehab - [facility]  Exercises - Corner Balance Feet Together With Eyes Open  - 1 x daily - 7 x weekly - 3 sets - 30 sec hold - Corner Balance Feet Together With Eyes Closed  - 1 x daily - 7 x weekly - 3 sets - 30 sec hold - Corner Balance Feet Together: Eyes Open With Head Turns  - 1 x daily - 7 x weekly - 3 sets - 3 reps - Corner Balance Feet Together: Eyes Closed With Head Turns  - 1 x daily - 7 x weekly - 3 sets - 3 reps - Seated Cervical Retraction  - 1 x daily - 7 x weekly - 2-5 sec hold - Seated Shoulder Row with Anchored Resistance  - 1 x daily - 7 x weekly - 3 sets - 10 reps - Supine to Right Sidelying Vestibular Habituation  - 1 x daily - 7 x weekly - 5-10 reps - Brandt-Daroff Vestibular Exercise  - 1 x daily - 5 x weekly - 2 sets - 3-5 reps   10/07/23 VESTIBULAR ASSESSMENT   GENERAL OBSERVATION: wears progressive lens bifocals, holds head in left lateral tilt    SYMPTOM BEHAVIOR:   Subjective history: hx of vertigo, reports feeling of fullness in right ear over the past week   Non-Vestibular symptoms: headaches   Type of dizziness: Spinning/Vertigo   Frequency: daily   Duration: seconds/minutes   Aggravating factors: Induced by position change: rolling to the right   Relieving factors: slow movements   Progression of symptoms: unchanged   OCULOMOTOR EXAM:   Ocular Alignment: normal   Ocular ROM: No Limitations   Spontaneous Nystagmus: absent   Gaze-Induced Nystagmus: absent   Smooth Pursuits: intact   Saccades: intact   Convergence/Divergence: 8 cm      VESTIBULAR - OCULAR REFLEX:    Slow VOR: Normal   VOR  Cancellation: Normal   Head-Impulse Test: HIT Right: negative HIT Left: negative   Dynamic Visual Acuity: Not able to be assessed      POSITIONAL TESTING: Right Dix-Hallpike: no nystagmus Left Dix-Hallpike: no nystagmus Right Roll Test:  no nystagmus but reports brief spinning sensation Left Roll Test: no nystagmus      Note: Objective measures were completed at Evaluation unless otherwise noted.  DIAGNOSTIC FINDINGS: IMPRESSION: No acute intracranial abnormality.  COGNITION: Overall cognitive status: Within functional limits for tasks assessed  SENSATION: Not tested Reports stocking distribution of symptoms in her feet Pain in left hand > right EDEMA:  Slight in bilat ankles  MUSCLE TONE:  NT  DTRs:  NT  POSTURE:  No Significant postural limitations  Cervical ROM:    Globally limited ROM extension > lateral flexion > rotation  STRENGTH:   LOWER EXTREMITY MMT:   BLE gross 4/5 strength  BED MOBILITY:  indep  TRANSFERS: Assistive device utilized: None  Sit to stand: Complete Independence Stand to sit: Complete Independence Chair to chair: Complete Independence Floor:  NT  RAMP: NT  CURB: Modified independence  GAIT: Gait pattern:  very limited left knee flexion during gait due to antalgia left knee OA Distance walked:  Assistive device utilized: Single point cane and None Level of assistance: Modified independence Comments:   FUNCTIONAL TESTS:  5 times sit to stand: 22.47 sec Dynamic Gait Index: 10/24  M-CTSIB  Condition 1: Firm Surface, EO 30 Sec, Normal Sway  Condition 2: Firm Surface, EC 30 Sec, Mild Sway  Condition 3: Foam Surface, EO 30 Sec, Mild Sway  Condition 4: Foam Surface, EC 30 Sec, Moderate Sway        GOALS: Goals reviewed with patient? Yes  SHORT TERM GOALS: Target date: same as LTG    LONG TERM GOALS: Target date: 10/28/2023    The patient will be independent with HEP for gaze adaptation, habituation, balance,  and general mobility. Baseline:  Goal status: IN PROGRESS  2.  Demo improved postural stability per normal-mild sway condition 4 M-CTSIB to improve safety with ADL Baseline:  Goal status: IN PROGRESS  3.  Reduce risk for falls per score 19/24 Dynamic Gait Index Baseline: 10/24 Goal status: IN PROGRESS  4.  Pt to report absence of positional dizziness to improve comfort and reduce risk for falls during mobility Baseline: TBD Goal status: IN PROGRESS    ASSESSMENT:  CLINICAL IMPRESSION: Patient arrived to session with report of some remaining dizziness. Positional testing revealed remaining R posterior canalithiasis. Trialed R universal repositioning maneuver to clear, with improved symptoms after 2 trials. Upon standing up, patient had to immediately sit down d/t dizziness. When trying ahead, she was escorted out to lobby to ensure safety. She reported feeling better upon leaving.   OBJECTIVE IMPAIRMENTS: Abnormal gait, decreased activity tolerance, decreased balance, difficulty walking, decreased ROM, decreased strength, dizziness, increased muscle spasms, improper body mechanics, and pain.   ACTIVITY LIMITATIONS: carrying, lifting, stairs, bed mobility, reach over head, and locomotion level  PARTICIPATION LIMITATIONS: meal prep, cleaning, interpersonal relationship, shopping, and exercise  PERSONAL FACTORS: Age, Time since onset of injury/illness/exacerbation, and 3+ comorbidities: PMH  are also affecting patient's functional outcome.   REHAB POTENTIAL: Excellent  CLINICAL DECISION MAKING: Evolving/moderate complexity  EVALUATION COMPLEXITY: Moderate   PLAN:  PT FREQUENCY: 1x/week  PT DURATION: 4 weeks  PLANNED INTERVENTIONS: 97110-Therapeutic exercises, 97530- Therapeutic activity, 97112- Neuromuscular re-education, 97535- Self Care, 72536- Manual therapy, [phone]- Gait training, [phone]- Canalith repositioning, [phone]- Aquatic Therapy, Patient/Family education, Balance  training, Stair training, Joint mobilization, Spinal mobilization, Vestibular training, and DME instructions  PLAN FOR NEXT SESSION: reassessment; HEP review and multi-sensory progressions   Anette Guarneri, PT, DPT 10/21/23 1:14 PM  [facility] at [facility] [address], [address] [place], [place] [address] Phone # [phone] Fax # [phone]

## 2023-10-20 NOTE — Progress Notes (Signed)
Established Patient Office Visit  Subjective:  Patient ID: Christina Mcclain, female    DOB: 07/08/1956  Age: 66 y.o. MRN: [id number]  Chief Complaint  Patient presents with   Annual Exam    AWV    Patient comes in for her follow-up and for annual wellness visit.  At her last visit she had not taken her medications as she was fasting for blood work.  However today her blood pressure looks good and she is feeling well.  Lab results discussed. Patient is up-to-date on her mammogram, DEXA scan, colonoscopy. She gets regular eye exams. Urine microalbumin done today. Her PHQ-9/GAD score is 2/0. Her CFS score is 0. Patient goes regularly to podiatry for footcare.  Denies painful burning, tingling or numbness in hands or feet.  Currently on Lyrica.    No other concerns at this time.   Past Medical History:  Diagnosis Date   Adverse effect of anesthesia    hard to awaken   Anemia    Arthritis    Asthma    Asthma without status asthmaticus    unspecified; Take advair each night   Breast cancer (HCC) 2021   right breast IMC   Bronchitis    Chronic kidney disease    stage III per dr Cherylann Ratel   Constipation    Diabetes (HCC) 2010   Diabetes mellitus type 2, uncomplicated (HCC)    Diabetes mellitus without complication (HCC)    Encounter for blood transfusion    hysterectomy 9-10 years ago   Heart murmur    unspecified   Hyperlipidemia    Hypertension 1991   Hypothyroid    Multiple thyroid nodules    Neuromuscular disorder (HCC)    neuropathy   Osteoarthritis of right hip 02/04/2017   Palpitations    Pernicious anemia    Personal history of chemotherapy 2020-2021   Right breast ca   Personal history of radiation therapy 2021   right breast [facility]   Pneumonia    Sleep apnea    Use C-PAP; can't use mouth piece. Anxiety   Thyroid disease    nodules   Thyroid nodule    x2   Urinary urgency     Past Surgical History:  Procedure Laterality Date   ABDOMINAL HYSTERECTOMY   2011   partial   ABDOMINAL SURGERY     BREAST BIOPSY Right 10/02/2020   affirm bx, x marker, fat necrosis   BREAST BIOPSY Right 10/19/2019   two masses at 9:00 [place] bx, coil marker and venus marker, IMC for both   BREAST LUMPECTOMY Right 03/28/2020   bracketing of two IMC masses at 9:00.  Venus marker was pushed medially during wire loc. Remains in breast.  Clear surgical margins. Negative Lymph node   CARPAL TUNNEL RELEASE Left 2008   CARPAL TUNNEL RELEASE Right    CHOLECYSTECTOMY  1982   COLONOSCOPY WITH PROPOFOL N/A 10/05/2017   Procedure: COLONOSCOPY WITH PROPOFOL;  Surgeon: Scot Jun, MD;  Location: [facility] ENDOSCOPY;  Service: Endoscopy;  Laterality: N/A;   COLONOSCOPY WITH PROPOFOL N/A 08/09/2021   Procedure: COLONOSCOPY WITH PROPOFOL;  Surgeon: Jaynie Collins, DO;  Location: [facility] ENDOSCOPY;  Service: Gastroenterology;  Laterality: N/A;   DIAGNOSTIC LAPAROSCOPY     ESOPHAGOGASTRODUODENOSCOPY (EGD) WITH PROPOFOL N/A 08/09/2021   Procedure: ESOPHAGOGASTRODUODENOSCOPY (EGD) WITH PROPOFOL;  Surgeon: Jaynie Collins, DO;  Location: [facility] ENDOSCOPY;  Service: Gastroenterology;  Laterality: N/A;   JOINT REPLACEMENT Right 02/09/2017   TOTAL HIP REPLACEMENT, DR. CARTER. [place]  KNEE ARTHROSCOPY  1990s   PARTIAL MASTECTOMY WITH NEEDLE LOCALIZATION AND AXILLARY SENTINEL LYMPH NODE BX Right 03/28/2020   Procedure: PARTIAL MASTECTOMY WITH NEEDLE LOCALIZATION AND AXILLARY SENTINEL LYMPH NODE BX;  Surgeon: Carolan Shiver, MD;  Location: [facility] ORS;  Service: General;  Laterality: Right;   PORTACATH PLACEMENT Left 11/07/2019   Procedure: INSERTION PORT-A-CATH;  Surgeon: Carolan Shiver, MD;  Location: [facility] ORS;  Service: General;  Laterality: Left;   TOTAL HIP ARTHROPLASTY Left 06/23/2017   Procedure: TOTAL HIP ARTHROPLASTY ANTERIOR APPROACH;  Surgeon: Kennedy Bucker, MD;  Location: [facility] ORS;  Service: Orthopedics;  Laterality: Left;    Social History   Socioeconomic  History   Marital status: Married    Spouse name: Not on file   Number of children: 2   Years of education: some college   Highest education level: Not on file  Occupational History   Occupation: retired    Comment: [employer]  Tobacco Use   Smoking status: Never   Smokeless tobacco: Never  Vaping Use   Vaping status: Never Used  Substance and Sexual Activity   Alcohol use: No   Drug use: No   Sexual activity: Not Currently  Other Topics Concern   Not on file  Social History Narrative   Not on file   Social Determinants of Health   Financial Resource Strain: Low Risk  (10/05/2023)   [facility]   Overall Financial Resource Strain (CARDIA)    Difficulty of Paying Living Expenses: Not hard at all  Food Insecurity: No Food Insecurity (10/05/2023)   Received from [facility]   Hunger Vital Sign    Worried About Running Out of Food in the Last Year: Never true    Ran Out of Food in the Last Year: Never true  Transportation Needs: No Transportation Needs (10/05/2023)   Received from [facility] - Transportation    In the past 12 months, has lack of transportation kept you from medical appointments or from getting medications?: No    Lack of Transportation (Non-Medical): No  Physical Activity: Not on file  Stress: Not on file  Social Connections: Not on file  Intimate Partner Violence: Not on file    Family History  Problem Relation Age of Onset   Heart disease Mother    Hypertension Mother    Arrhythmia Mother    Heart attack Mother    Diabetes Mother    Hypertension Father    Parkinson's disease Father    Coronary artery disease Maternal Uncle    Heart attack Maternal Uncle    Diabetes Sister    Diabetes Brother    Heart disease Brother    Breast cancer Neg Hx     Allergies  Allergen Reactions   Amlodipine Other (See Comments)    edema   Statins Rash    Other  reaction(s): Muscle Pain Possible hives also    Outpatient Medications Prior to Visit  Medication Sig   acetaminophen (TYLENOL) 500 MG tablet Take 500 mg by mouth every 6 (six) hours as needed.   albuterol (PROVENTIL HFA;VENTOLIN HFA) 108 (90 Base) MCG/ACT inhaler Inhale 2 puffs into the lungs every 6 (six) hours as needed for wheezing or shortness of breath.   Ascorbic Acid (VITAMIN C) 1000 MG tablet Take 2 tablets by mouth daily.   CALCIUM CITRATE PO Take 1,000 mg by mouth daily.   carvedilol (COREG) 25 MG tablet TAKE 1  TABLET BY MOUTH TWICE DAILY   Cholecalciferol (VITAMIN D-3) 125 MCG (5000 UT) TABS Take 2 tablets by mouth daily.   empagliflozin (JARDIANCE) 10 MG TABS tablet Take 1 tablet (10 mg total) by mouth daily before breakfast.   ezetimibe (ZETIA) 10 MG tablet TAKE 1 TABLET BY MOUTH EVERY DAY   Fluticasone-Salmeterol (ADVAIR) 250-50 MCG/DOSE AEPB Inhale 1 puff into the lungs 2 (two) times daily.    hydrALAZINE (APRESOLINE) 25 MG tablet Take 1 tablet (25 mg total) by mouth 2 times daily at 12 noon and 4 pm.   loratadine (CLARITIN) 10 MG tablet Take 10 mg by mouth daily as needed for allergies.    losartan (COZAAR) 25 MG tablet    magnesium oxide (MAG-OX) 400 MG tablet Take 2 tablets by mouth daily.   pantoprazole (PROTONIX) 40 MG tablet Take 40 mg by mouth daily.   pregabalin (LYRICA) 50 MG capsule TAKE ONE CAPSULE BY MOUTH TWICE DAILY   solifenacin (VESICARE) 10 MG tablet Take 10 mg by mouth every evening.   cholecalciferol (VITAMIN D) 1000 units tablet Take 2,000 Units by mouth daily. (Patient not taking: Reported on 10/16/2023)   furosemide (LASIX) 20 MG tablet  (Patient not taking: Reported on 10/20/2023)   No facility-administered medications prior to visit.    Review of Systems  Constitutional: Negative.  Negative for chills, fever, malaise/fatigue and weight loss.  HENT: Negative.  Negative for congestion, sinus pain and sore throat.   Eyes: Negative.   Respiratory:  Negative.  Negative for cough and shortness of breath.   Cardiovascular: Negative.  Negative for chest pain, palpitations and leg swelling.  Gastrointestinal:  Negative for abdominal pain, blood in stool, constipation, diarrhea, heartburn, melena, nausea and vomiting.  Genitourinary: Negative.  Negative for dysuria and flank pain.  Musculoskeletal: Negative.  Negative for joint pain and myalgias.  Skin: Negative.   Neurological:  Negative for dizziness, tingling, tremors, sensory change and headaches.  Endo/Heme/Allergies: Negative.   Psychiatric/Behavioral: Negative.  Negative for depression and suicidal ideas. The patient is not nervous/anxious.        Objective:   BP 130/60   Pulse 70   Ht 5\' 2"  (1.575 m)   Wt 219 lb 9.6 oz (99.6 kg)   SpO2 94%   BMI 40.17 kg/m   Vitals:   10/20/23 1349  BP: 130/60  Pulse: 70  Height: 5\' 2"  (1.575 m)  Weight: 219 lb 9.6 oz (99.6 kg)  SpO2: 94%  BMI (Calculated): 40.16    Physical Exam Vitals and nursing note reviewed.  Constitutional:      Appearance: Normal appearance.  HENT:     Head: Normocephalic and atraumatic.     Nose: Nose normal.     Mouth/Throat:     Mouth: Mucous membranes are moist.     Pharynx: Oropharynx is clear.  Eyes:     Conjunctiva/sclera: Conjunctivae normal.     Pupils: Pupils are equal, round, and reactive to light.  Cardiovascular:     Rate and Rhythm: Normal rate and regular rhythm.     Pulses: Normal pulses.     Heart sounds: Normal heart sounds. No murmur heard. Pulmonary:     Effort: Pulmonary effort is normal.     Breath sounds: Normal breath sounds. No wheezing.  Abdominal:     General: Bowel sounds are normal.     Palpations: Abdomen is soft.     Tenderness: There is no abdominal tenderness. There is no right CVA tenderness or left CVA tenderness.  Musculoskeletal:        General: Normal range of motion.     Cervical back: Normal range of motion.     Right lower leg: No edema.     Left  lower leg: No edema.  Skin:    General: Skin is warm and dry.  Neurological:     General: No focal deficit present.     Mental Status: She is alert and oriented to person, place, and time.  Psychiatric:        Mood and Affect: Mood normal.        Behavior: Behavior normal.      Results for orders placed or performed in visit on 10/20/23  POCT Glucose (CBG)  Result Value Ref Range   POC Glucose 105 (A) 70 - 99 mg/dl  POC CREATINE & ALBUMIN,URINE  Result Value Ref Range   Microalbumin Ur, POC 80 mg/L   Creatinine, POC 200 mg/dL   Albumin/Creatinine Ratio, Urine, POC 30-300     Recent Results (from the past 2160 hour(s))  CBC with Diff     Status: Abnormal   Collection Time: 10/16/23 10:28 AM  Result Value Ref Range   WBC 4.0 3.4 - 10.8 x10E3/uL    Comment: **Effective October 26, 2023 profile 005025 WBC will be made**   non-orderable as a stand-alone order code.    RBC 4.81 3.77 - 5.28 x10E6/uL   Hemoglobin 12.1 11.1 - 15.9 g/dL   Hematocrit 40.9 81.1 - 46.6 %   MCV 84 79 - 97 fL   MCH 25.2 (L) 26.6 - 33.0 pg   MCHC 30.1 (L) 31.5 - 35.7 g/dL   RDW 91.4 78.2 - 95.6 %   Platelets 169 150 - 450 x10E3/uL   Neutrophils 40 Not Estab. %   Lymphs 44 Not Estab. %   Monocytes 12 Not Estab. %   Eos 2 Not Estab. %   Basos 2 Not Estab. %   Neutrophils Absolute 1.6 1.4 - 7.0 x10E3/uL   Lymphocytes Absolute 1.8 0.7 - 3.1 x10E3/uL   Monocytes Absolute 0.5 0.1 - 0.9 x10E3/uL   EOS (ABSOLUTE) 0.1 0.0 - 0.4 x10E3/uL   Basophils Absolute 0.1 0.0 - 0.2 x10E3/uL   Immature Granulocytes 0 Not Estab. %   Immature Grans (Abs) 0.0 0.0 - 0.1 x10E3/uL  CMP14+EGFR     Status: Abnormal   Collection Time: 10/16/23 10:28 AM  Result Value Ref Range   Glucose 90 70 - 99 mg/dL   BUN 28 (H) 8 - 27 mg/dL   Creatinine, Ser 2.13 (H) 0.57 - 1.00 mg/dL   eGFR 40 (L) >08 MV/HQI/6.96   BUN/Creatinine Ratio 20 12 - 28   Sodium 145 (H) 134 - 144 mmol/L   Potassium 4.5 3.5 - 5.2 mmol/L   Chloride 105  96 - 106 mmol/L   CO2 28 20 - 29 mmol/L   Calcium 9.9 8.7 - 10.3 mg/dL   Total Protein 6.9 6.0 - 8.5 g/dL   Albumin 4.2 3.9 - 4.9 g/dL   Globulin, Total 2.7 1.5 - 4.5 g/dL   Bilirubin Total 0.3 0.0 - 1.2 mg/dL   Alkaline Phosphatase 120 44 - 121 IU/L   AST 15 0 - 40 IU/L   ALT 11 0 - 32 IU/L  Lipid Panel w/o Chol/HDL Ratio     Status: None   Collection Time: 10/16/23 10:28 AM  Result Value Ref Range   Cholesterol, Total 187 100 - 199 mg/dL   Triglycerides 64 0 - 149  mg/dL   HDL 87 >28 mg/dL   VLDL Cholesterol Cal 12 5 - 40 mg/dL   LDL Chol Calc (NIH) 88 0 - 99 mg/dL  Vitamin D (25 hydroxy)     Status: None   Collection Time: 10/16/23 10:28 AM  Result Value Ref Range   Vit D, 25-Hydroxy 40.7 30.0 - 100.0 ng/mL    Comment: Vitamin D deficiency has been defined by the Institute of Medicine and an Endocrine Society practice guideline as a level of serum 25-OH vitamin D less than 20 ng/mL (1,2). The Endocrine Society went on to further define vitamin D insufficiency as a level between 21 and 29 ng/mL (2). 1. IOM (Institute of Medicine). 2010. Dietary reference    intakes for calcium and D. [place]: The    [employer]. 2. Holick MF, Binkley [place], Bischoff-Ferrari HA, et al.    Evaluation, treatment, and prevention of vitamin D    deficiency: an Endocrine Society clinical practice    guideline. JCEM. 2011 Jul; 96(7):1911-30.   Hemoglobin A1c     Status: Abnormal   Collection Time: 10/16/23 10:28 AM  Result Value Ref Range   Hgb A1c MFr Bld 6.9 (H) 4.8 - 5.6 %    Comment:          Prediabetes: 5.7 - 6.4          Diabetes: >6.4          Glycemic control for adults with diabetes: <7.0    Est. average glucose Bld gHb Est-mCnc 151 mg/dL  POCT Glucose (CBG)     Status: Normal   Collection Time: 10/16/23 11:29 AM  Result Value Ref Range   POC Glucose 98 70 - 99 mg/dl  POCT Glucose (CBG)     Status: Abnormal   Collection Time: 10/20/23  1:54 PM  Result Value Ref  Range   POC Glucose 105 (A) 70 - 99 mg/dl  POC CREATINE & ALBUMIN,URINE     Status: Abnormal   Collection Time: 10/20/23  2:43 PM  Result Value Ref Range   Microalbumin Ur, POC 80 mg/L   Creatinine, POC 200 mg/dL   Albumin/Creatinine Ratio, Urine, POC 30-300       Assessment & Plan:  Patient advised to continue all her medications. Problem List Items Addressed This Visit     Type 2 diabetes mellitus with stage 3b chronic kidney disease, without long-term current use of insulin (HCC)   Relevant Orders   POCT Glucose (CBG) (Completed)   POC CREATINE & ALBUMIN,URINE (Completed)   Gastro-esophageal reflux disease without esophagitis   Obstructive sleep apnea syndrome   Mixed hyperlipidemia   Hypertension associated with diabetes (HCC) - Primary    Return in about 3 months (around 01/20/2024).   Total time spent: 30 minutes  Margaretann Loveless, MD  10/20/2023   This document may have been prepared by [facility] Voice Recognition software and as such may include unintentional dictation errors.

## 2023-10-21 ENCOUNTER — Encounter: Payer: Self-pay | Admitting: Physical Therapy

## 2023-10-21 ENCOUNTER — Ambulatory Visit: Payer: Medicare PPO | Admitting: Physical Therapy

## 2023-10-21 DIAGNOSIS — R42 Dizziness and giddiness: Secondary | ICD-10-CM | POA: Diagnosis not present

## 2023-10-21 DIAGNOSIS — R2689 Other abnormalities of gait and mobility: Secondary | ICD-10-CM

## 2023-10-21 DIAGNOSIS — R262 Difficulty in walking, not elsewhere classified: Secondary | ICD-10-CM | POA: Diagnosis not present

## 2023-10-21 DIAGNOSIS — R2681 Unsteadiness on feet: Secondary | ICD-10-CM

## 2023-10-21 NOTE — Patient Instructions (Signed)
[employer] [address] [place], [place], [place] [address]  [phone]   [facility] [employer] [address] [place], [place] [address] [phone]

## 2023-10-26 DIAGNOSIS — R2242 Localized swelling, mass and lump, left lower limb: Secondary | ICD-10-CM | POA: Diagnosis not present

## 2023-10-26 DIAGNOSIS — M1712 Unilateral primary osteoarthritis, left knee: Secondary | ICD-10-CM | POA: Diagnosis not present

## 2023-10-26 NOTE — Therapy (Signed)
OUTPATIENT PHYSICAL THERAPY VESTIBULAR TREATMENT     Patient Name: Christina Mcclain MRN: [id number] DOB:Nov 26, 1955, 67 y.o., female Today's Date: 10/26/2023  END OF SESSION:      Past Medical History:  Diagnosis Date   Adverse effect of anesthesia    hard to awaken   Anemia    Arthritis    Asthma    Asthma without status asthmaticus    unspecified; Take advair each night   Breast cancer (HCC) 2021   right breast IMC   Bronchitis    Chronic kidney disease    stage III per dr Cherylann Ratel   Constipation    Diabetes (HCC) 2010   Diabetes mellitus type 2, uncomplicated (HCC)    Diabetes mellitus without complication (HCC)    Encounter for blood transfusion    hysterectomy 9-10 years ago   Heart murmur    unspecified   Hyperlipidemia    Hypertension 1991   Hypothyroid    Multiple thyroid nodules    Neuromuscular disorder (HCC)    neuropathy   Osteoarthritis of right hip 02/04/2017   Palpitations    Pernicious anemia    Personal history of chemotherapy 2020-2021   Right breast ca   Personal history of radiation therapy 2021   right breast [facility]   Pneumonia    Sleep apnea    Use C-PAP; can't use mouth piece. Anxiety   Thyroid disease    nodules   Thyroid nodule    x2   Urinary urgency    Past Surgical History:  Procedure Laterality Date   ABDOMINAL HYSTERECTOMY  2011   partial   ABDOMINAL SURGERY     BREAST BIOPSY Right 10/02/2020   affirm bx, x marker, fat necrosis   BREAST BIOPSY Right 10/19/2019   two masses at 9:00 [place] bx, coil marker and venus marker, IMC for both   BREAST LUMPECTOMY Right 03/28/2020   bracketing of two IMC masses at 9:00.  Venus marker was pushed medially during wire loc. Remains in breast.  Clear surgical margins. Negative Lymph node   CARPAL TUNNEL RELEASE Left 2008   CARPAL TUNNEL RELEASE Right    CHOLECYSTECTOMY  1982   COLONOSCOPY WITH PROPOFOL N/A 10/05/2017   Procedure: COLONOSCOPY WITH PROPOFOL;  Surgeon: Scot Jun, MD;   Location[facility] ENDOSCOPY;  Service: Endoscopy;  Laterality: N/A;   COLONOSCOPY WITH PROPOFOL N/A 08/09/2021   Procedure: COLONOSCOPY WITH PROPOFOL;  Surgeon: Jaynie Collins, DO;  Location: [facility] ENDOSCOPY;  Service: Gastroenterology;  Laterality: N/A;   DIAGNOSTIC LAPAROSCOPY     ESOPHAGOGASTRODUODENOSCOPY (EGD) WITH PROPOFOL N/A 08/09/2021   Procedure: ESOPHAGOGASTRODUODENOSCOPY (EGD) WITH PROPOFOL;  Surgeon: Jaynie Collins, DO;  Location: [facility] ENDOSCOPY;  Service: Gastroenterology;  Laterality: N/A;   JOINT REPLACEMENT Right 02/09/2017   TOTAL HIP REPLACEMENT, DR. CARTER. [place]   KNEE ARTHROSCOPY  1990s   PARTIAL MASTECTOMY WITH NEEDLE LOCALIZATION AND AXILLARY SENTINEL LYMPH NODE BX Right 03/28/2020   Procedure: PARTIAL MASTECTOMY WITH NEEDLE LOCALIZATION AND AXILLARY SENTINEL LYMPH NODE BX;  Surgeon: Carolan Shiver, MD;  Location: [facility] ORS;  Service: General;  Laterality: Right;   PORTACATH PLACEMENT Left 11/07/2019   Procedure: INSERTION PORT-A-CATH;  Surgeon: Carolan Shiver, MD;  Location: [facility] ORS;  Service: General;  Laterality: Left;   TOTAL HIP ARTHROPLASTY Left 06/23/2017   Procedure: TOTAL HIP ARTHROPLASTY ANTERIOR APPROACH;  Surgeon: Kennedy Bucker, MD;  Location: [facility] ORS;  Service: Orthopedics;  Laterality: Left;   Patient Active Problem List   Diagnosis Date Noted   Hypertension  associated with diabetes (HCC) 10/16/2023   Combined hyperlipidemia associated with type 2 diabetes mellitus (HCC) 10/16/2023   SOB (shortness of breath) 08/11/2023   Urinary tract infection without hematuria 04/09/2023   Mixed hyperlipidemia 04/09/2023   Rash of face 03/17/2023   Absolute anemia 03/17/2023   Palpitations 11/09/2019   Pelvic pressure in female 11/09/2019   Invasive carcinoma of breast (HCC) 10/31/2019   Goals of care, counseling/discussion 10/31/2019   Nonintractable headache 08/03/2019   Tinnitus of both ears 08/03/2019   Bilateral hand numbness  09/14/2018   Bilateral hand pain 09/14/2018   Myalgia due to statin 08/26/2018   BMI 40.0-44.9, adult (HCC) 12/16/2017   Primary localized osteoarthrosis of left hip 06/23/2017   Type 2 diabetes mellitus with stage 3b chronic kidney disease, without long-term current use of insulin (HCC) 03/17/2017   Pernicious anemia 03/17/2017   Primary osteoarthritis involving multiple joints 03/17/2017   Status post right hip replacement 02/20/2017   At high risk for falls 02/11/2017   Mild persistent asthma without complication 02/11/2017   Impaired mobility and ADLs 02/11/2017   Myoclonus 07/23/2016   DISH (diffuse idiopathic skeletal hyperostosis) 07/23/2016   Obstructive sleep apnea syndrome 07/23/2016   Thyromegaly 07/23/2016   Left thyroid nodule 09/04/2014   Chest pain, unspecified 11/14/2012   High cholesterol 11/14/2012   Gastro-esophageal reflux disease without esophagitis 11/14/2012   Family history of early CAD 11/14/2012   Overactive bladder 04/12/2012   Hemorrhoids 01/08/2010   Essential hypertension, benign 01/08/2010   Osteoarthritis of right hip 01/08/2010    PCP: Margaretann Loveless, MD REFERRING PROVIDER: Janice Coffin, PA-C  REFERRING DIAG: G62.9 (ICD-10-CM) - Polyneuropathy, unspecified R26.89 (ICD-10-CM) - Other abnormalities of gait and mobility  THERAPY DIAG:  No diagnosis found.  ONSET DATE: worse past 6 months  Rationale for Evaluation and Treatment: Rehabilitation  SUBJECTIVE:   SUBJECTIVE STATEMENT: Did her exercises "and it got better with each one, but then yesterday when I did it yesterday it was really bad." Pt accompanied by: self  PERTINENT HISTORY: Vertigo, several minutes, sudden onset in supine position Sudden onset vertigo in the supine position 09/15/2023, resolved. Headache this morning, resolved by time of appointment. R TKR, left knee OA  PAIN:  Are you having pain? Yes: NPRS scale: 2/10 Pain location: R sided headache Pain description:  headache Aggravating factors: nothing Relieving factors: turning head a certain way  PRECAUTIONS: Fall  RED FLAGS: None   WEIGHT BEARING RESTRICTIONS: No  FALLS: Has patient fallen in last 6 months? No  LIVING ENVIRONMENT: Lives with: lives with their spouse Lives in: House/apartment Stairs: No Has following equipment at home: Single point cane  PLOF: Independent with basic ADLs and Independent with household mobility without device  PATIENT GOALS:   OBJECTIVE:     TODAY'S TREATMENT: 10/28/23 Activity Comments                      CERVICAL ROM:   Active ROM A/PROM (deg) eval  Flexion   Extension   Right lateral flexion   Left lateral flexion   Right rotation   Left rotation    (Blank rows = not tested)     HOME EXERCISE PROGRAM: Access Code: [email or web address] URL: [email or web address] Date: 10/14/2023 Prepared by: [facility] - Outpatient  Rehab - [facility]  Exercises - Corner Balance Feet Together With Eyes Open  - 1 x daily - 7 x weekly - 3 sets - 30 sec hold - Corner Balance Feet Together  With Eyes Closed  - 1 x daily - 7 x weekly - 3 sets - 30 sec hold - Corner Balance Feet Together: Eyes Open With Head Turns  - 1 x daily - 7 x weekly - 3 sets - 3 reps - Corner Balance Feet Together: Eyes Closed With Head Turns  - 1 x daily - 7 x weekly - 3 sets - 3 reps - Seated Cervical Retraction  - 1 x daily - 7 x weekly - 2-5 sec hold - Seated Shoulder Row with Anchored Resistance  - 1 x daily - 7 x weekly - 3 sets - 10 reps - Supine to Right Sidelying Vestibular Habituation  - 1 x daily - 7 x weekly - 5-10 reps - Brandt-Daroff Vestibular Exercise  - 1 x daily - 5 x weekly - 2 sets - 3-5 reps   10/07/23 VESTIBULAR ASSESSMENT   GENERAL OBSERVATION: wears progressive lens bifocals, holds head in left lateral tilt    SYMPTOM BEHAVIOR:   Subjective history: hx of vertigo, reports feeling of fullness in right ear over the past  week   Non-Vestibular symptoms: headaches   Type of dizziness: Spinning/Vertigo   Frequency: daily   Duration: seconds/minutes   Aggravating factors: Induced by position change: rolling to the right   Relieving factors: slow movements   Progression of symptoms: unchanged   OCULOMOTOR EXAM:   Ocular Alignment: normal   Ocular ROM: No Limitations   Spontaneous Nystagmus: absent   Gaze-Induced Nystagmus: absent   Smooth Pursuits: intact   Saccades: intact   Convergence/Divergence: 8 cm      VESTIBULAR - OCULAR REFLEX:    Slow VOR: Normal   VOR Cancellation: Normal   Head-Impulse Test: HIT Right: negative HIT Left: negative   Dynamic Visual Acuity: Not able to be assessed      POSITIONAL TESTING: Right Dix-Hallpike: no nystagmus Left Dix-Hallpike: no nystagmus Right Roll Test:  no nystagmus but reports brief spinning sensation Left Roll Test: no nystagmus      Note: Objective measures were completed at Evaluation unless otherwise noted.  DIAGNOSTIC FINDINGS: IMPRESSION: No acute intracranial abnormality.  COGNITION: Overall cognitive status: Within functional limits for tasks assessed   SENSATION: Not tested Reports stocking distribution of symptoms in her feet Pain in left hand > right EDEMA:  Slight in bilat ankles  MUSCLE TONE:  NT  DTRs:  NT  POSTURE:  No Significant postural limitations  Cervical ROM:    Globally limited ROM extension > lateral flexion > rotation  STRENGTH:   LOWER EXTREMITY MMT:   BLE gross 4/5 strength  BED MOBILITY:  indep  TRANSFERS: Assistive device utilized: None  Sit to stand: Complete Independence Stand to sit: Complete Independence Chair to chair: Complete Independence Floor:  NT  RAMP: NT  CURB: Modified independence  GAIT: Gait pattern:  very limited left knee flexion during gait due to antalgia left knee OA Distance walked:  Assistive device utilized: Single point cane and None Level of assistance:  Modified independence Comments:   FUNCTIONAL TESTS:  5 times sit to stand: 22.47 sec Dynamic Gait Index: 10/24  M-CTSIB  Condition 1: Firm Surface, EO 30 Sec, Normal Sway  Condition 2: Firm Surface, EC 30 Sec, Mild Sway  Condition 3: Foam Surface, EO 30 Sec, Mild Sway  Condition 4: Foam Surface, EC 30 Sec, Moderate Sway        GOALS: Goals reviewed with patient? Yes  SHORT TERM GOALS: Target date: same as LTG  LONG TERM GOALS: Target date: 10/28/2023    The patient will be independent with HEP for gaze adaptation, habituation, balance, and general mobility. Baseline:  Goal status: IN PROGRESS  2.  Demo improved postural stability per normal-mild sway condition 4 M-CTSIB to improve safety with ADL Baseline:  Goal status: IN PROGRESS  3.  Reduce risk for falls per score 19/24 Dynamic Gait Index Baseline: 10/24 Goal status: IN PROGRESS  4.  Pt to report absence of positional dizziness to improve comfort and reduce risk for falls during mobility Baseline: TBD Goal status: IN PROGRESS    ASSESSMENT:  CLINICAL IMPRESSION: Patient arrived to session with report of some remaining dizziness. Positional testing revealed remaining R posterior canalithiasis. Trialed R universal repositioning maneuver to clear, with improved symptoms after 2 trials. Upon standing up, patient had to immediately sit down d/t dizziness. When trying ahead, she was escorted out to lobby to ensure safety. She reported feeling better upon leaving.   OBJECTIVE IMPAIRMENTS: Abnormal gait, decreased activity tolerance, decreased balance, difficulty walking, decreased ROM, decreased strength, dizziness, increased muscle spasms, improper body mechanics, and pain.   ACTIVITY LIMITATIONS: carrying, lifting, stairs, bed mobility, reach over head, and locomotion level  PARTICIPATION LIMITATIONS: meal prep, cleaning, interpersonal relationship, shopping, and exercise  PERSONAL FACTORS: Age, Time since  onset of injury/illness/exacerbation, and 3+ comorbidities: PMH  are also affecting patient's functional outcome.   REHAB POTENTIAL: Excellent  CLINICAL DECISION MAKING: Evolving/moderate complexity  EVALUATION COMPLEXITY: Moderate   PLAN:  PT FREQUENCY: 1x/week  PT DURATION: 4 weeks  PLANNED INTERVENTIONS: 97110-Therapeutic exercises, 97530- Therapeutic activity, 97112- Neuromuscular re-education, 97535- Self Care, 16109- Manual therapy, [phone]- Gait training, [phone]- Canalith repositioning, [phone]- Aquatic Therapy, Patient/Family education, Balance training, Stair training, Joint mobilization, Spinal mobilization, Vestibular training, and DME instructions  PLAN FOR NEXT SESSION: reassessment; HEP review and multi-sensory progressions   Anette Guarneri, PT, DPT 10/26/23 3:57 PM  [facility] at [facility] [address] [place], [place] [address] Phone # [phone] Fax # [phone]

## 2023-10-28 ENCOUNTER — Ambulatory Visit: Payer: Medicare PPO | Attending: Student | Admitting: Physical Therapy

## 2023-10-28 ENCOUNTER — Encounter: Payer: Self-pay | Admitting: Physical Therapy

## 2023-10-28 DIAGNOSIS — R262 Difficulty in walking, not elsewhere classified: Secondary | ICD-10-CM | POA: Insufficient documentation

## 2023-10-28 DIAGNOSIS — M542 Cervicalgia: Secondary | ICD-10-CM | POA: Diagnosis not present

## 2023-10-28 DIAGNOSIS — R2681 Unsteadiness on feet: Secondary | ICD-10-CM | POA: Insufficient documentation

## 2023-10-28 DIAGNOSIS — R42 Dizziness and giddiness: Secondary | ICD-10-CM | POA: Insufficient documentation

## 2023-10-28 DIAGNOSIS — R2689 Other abnormalities of gait and mobility: Secondary | ICD-10-CM | POA: Diagnosis not present

## 2023-10-29 NOTE — Therapy (Signed)
OUTPATIENT PHYSICAL THERAPY VESTIBULAR NOTE     Patient Name: Christina Mcclain MRN: [id number] DOB:03-09-1956, 67 y.o., female Today's Date: 11/02/2023  END OF SESSION:  PT End of Session - 11/02/23 1144     Visit Number 6    Number of Visits 14    Date for PT Re-Evaluation 12/09/23    Authorization Type [employer]    Authorization Time Period approved total of 14 PT visits from 09/30/2023 - 12/09/2023   new auth submitted   Authorization - Visit Number 6    Authorization - Number of Visits 14    PT Start Time 1116    PT Stop Time 1145    PT Time Calculation (min) 29 min    Activity Tolerance Patient tolerated treatment well    Behavior During Therapy WFL for tasks assessed/performed                 Past Medical History:  Diagnosis Date   Adverse effect of anesthesia    hard to awaken   Anemia    Arthritis    Asthma    Asthma without status asthmaticus    unspecified; Take advair each night   Breast cancer (HCC) 2021   right breast IMC   Bronchitis    Chronic kidney disease    stage III per dr Cherylann Ratel   Constipation    Diabetes (HCC) 2010   Diabetes mellitus type 2, uncomplicated (HCC)    Diabetes mellitus without complication (HCC)    Encounter for blood transfusion    hysterectomy 9-10 years ago   Heart murmur    unspecified   Hyperlipidemia    Hypertension 1991   Hypothyroid    Multiple thyroid nodules    Neuromuscular disorder (HCC)    neuropathy   Osteoarthritis of right hip 02/04/2017   Palpitations    Pernicious anemia    Personal history of chemotherapy 2020-2021   Right breast ca   Personal history of radiation therapy 2021   right breast [facility]   Pneumonia    Sleep apnea    Use C-PAP; can't use mouth piece. Anxiety   Thyroid disease    nodules   Thyroid nodule    x2   Urinary urgency    Past Surgical History:  Procedure Laterality Date   ABDOMINAL HYSTERECTOMY  2011   partial   ABDOMINAL SURGERY     BREAST BIOPSY Right  10/02/2020   affirm bx, x marker, fat necrosis   BREAST BIOPSY Right 10/19/2019   two masses at 9:00 [place] bx, coil marker and venus marker, IMC for both   BREAST LUMPECTOMY Right 03/28/2020   bracketing of two IMC masses at 9:00.  Venus marker was pushed medially during wire loc. Remains in breast.  Clear surgical margins. Negative Lymph node   CARPAL TUNNEL RELEASE Left 2008   CARPAL TUNNEL RELEASE Right    CHOLECYSTECTOMY  1982   COLONOSCOPY WITH PROPOFOL N/A 10/05/2017   Procedure: COLONOSCOPY WITH PROPOFOL;  Surgeon: Scot Jun, MD;  Location: [facility] ENDOSCOPY;  Service: Endoscopy;  Laterality: N/A;   COLONOSCOPY WITH PROPOFOL N/A 08/09/2021   Procedure: COLONOSCOPY WITH PROPOFOL;  Surgeon: Jaynie Collins, DO;  Location: [facility] ENDOSCOPY;  Service: Gastroenterology;  Laterality: N/A;   DIAGNOSTIC LAPAROSCOPY     ESOPHAGOGASTRODUODENOSCOPY (EGD) WITH PROPOFOL N/A 08/09/2021   Procedure: ESOPHAGOGASTRODUODENOSCOPY (EGD) WITH PROPOFOL;  Surgeon: Jaynie Collins, DO;  Location: [facility] ENDOSCOPY;  Service: Gastroenterology;  Laterality: N/A;   JOINT REPLACEMENT Right 02/09/2017  TOTAL HIP REPLACEMENT, DR. CARTER. [place]   KNEE ARTHROSCOPY  1990s   PARTIAL MASTECTOMY WITH NEEDLE LOCALIZATION AND AXILLARY SENTINEL LYMPH NODE BX Right 03/28/2020   Procedure: PARTIAL MASTECTOMY WITH NEEDLE LOCALIZATION AND AXILLARY SENTINEL LYMPH NODE BX;  Surgeon: Carolan Shiver, MD;  Location: [facility] ORS;  Service: General;  Laterality: Right;   PORTACATH PLACEMENT Left 11/07/2019   Procedure: INSERTION PORT-A-CATH;  Surgeon: Carolan Shiver, MD;  Location: [facility] ORS;  Service: General;  Laterality: Left;   TOTAL HIP ARTHROPLASTY Left 06/23/2017   Procedure: TOTAL HIP ARTHROPLASTY ANTERIOR APPROACH;  Surgeon: Kennedy Bucker, MD;  Location: [facility] ORS;  Service: Orthopedics;  Laterality: Left;   Patient Active Problem List   Diagnosis Date Noted   Hypertension associated with diabetes (HCC)  10/16/2023   Combined hyperlipidemia associated with type 2 diabetes mellitus (HCC) 10/16/2023   SOB (shortness of breath) 08/11/2023   Urinary tract infection without hematuria 04/09/2023   Mixed hyperlipidemia 04/09/2023   Rash of face 03/17/2023   Absolute anemia 03/17/2023   Palpitations 11/09/2019   Pelvic pressure in female 11/09/2019   Invasive carcinoma of breast (HCC) 10/31/2019   Goals of care, counseling/discussion 10/31/2019   Nonintractable headache 08/03/2019   Tinnitus of both ears 08/03/2019   Bilateral hand numbness 09/14/2018   Bilateral hand pain 09/14/2018   Myalgia due to statin 08/26/2018   BMI 40.0-44.9, adult (HCC) 12/16/2017   Primary localized osteoarthrosis of left hip 06/23/2017   Type 2 diabetes mellitus with stage 3b chronic kidney disease, without long-term current use of insulin (HCC) 03/17/2017   Pernicious anemia 03/17/2017   Primary osteoarthritis involving multiple joints 03/17/2017   Status post right hip replacement 02/20/2017   At high risk for falls 02/11/2017   Mild persistent asthma without complication 02/11/2017   Impaired mobility and ADLs 02/11/2017   Myoclonus 07/23/2016   DISH (diffuse idiopathic skeletal hyperostosis) 07/23/2016   Obstructive sleep apnea syndrome 07/23/2016   Thyromegaly 07/23/2016   Left thyroid nodule 09/04/2014   Chest pain, unspecified 11/14/2012   High cholesterol 11/14/2012   Gastro-esophageal reflux disease without esophagitis 11/14/2012   Family history of early CAD 11/14/2012   Overactive bladder 04/12/2012   Hemorrhoids 01/08/2010   Essential hypertension, benign 01/08/2010   Osteoarthritis of right hip 01/08/2010    PCP: Margaretann Loveless, MD REFERRING PROVIDER: Janice Coffin, PA-C  REFERRING DIAG: G62.9 (ICD-10-CM) - Polyneuropathy, unspecified R26.89 (ICD-10-CM) - Other abnormalities of gait and mobility  THERAPY DIAG:  Unsteadiness on feet  Difficulty in walking, not elsewhere  classified  Other abnormalities of gait and mobility  Dizziness and giddiness  Cervicalgia  ONSET DATE: worse past 6 months  Rationale for Evaluation and Treatment: Rehabilitation  SUBJECTIVE:   SUBJECTIVE STATEMENT: Apologizes for being late- got lost.    Pt accompanied by: self  PERTINENT HISTORY: Vertigo, several minutes, sudden onset in supine position Sudden onset vertigo in the supine position 09/15/2023, resolved. Headache this morning, resolved by time of appointment. R TKR, left knee OA  PAIN:  Are you having pain? Yes: NPRS scale: 5/10 Pain location: R sided frontal headache Pain description: headache Aggravating factors: nothing Relieving factors: turning head a certain way  PRECAUTIONS: Fall  RED FLAGS: None   WEIGHT BEARING RESTRICTIONS: No  FALLS: Has patient fallen in last 6 months? No  LIVING ENVIRONMENT: Lives with: lives with their spouse Lives in: House/apartment Stairs: No Has following equipment at home: Single point cane  PLOF: Independent with basic ADLs and Independent with household mobility  without device  PATIENT GOALS:   OBJECTIVE:    TODAY'S TREATMENT: 11/02/23 Activity Comments  STM and manual TPR Soft tissue restriction througout neck; pt TTP at origin of SCM  central and translational cervical PA's grade III Tolerated well  Cervical rotation MET 3x5" followed by passive rotation stretch 20"x3" Tolerated well. Reported some dizziness upon sitting up   cervical rotation SNAG 2x5 each side Cueing and demo for proper hand placement. Pt requires more guidance and cueing for proper form. Reports some       HOME EXERCISE PROGRAM: Access Code: [email or web address] URL: [email or web address] Date: 10/14/2023 Prepared by: [facility] - Outpatient  Rehab - [facility]  Exercises - Corner Balance Feet Together With Eyes Open  - 1 x daily - 7 x weekly - 3 sets - 30 sec hold - Corner Balance Feet Together With Eyes Closed  -  1 x daily - 7 x weekly - 3 sets - 30 sec hold - Corner Balance Feet Together: Eyes Open With Head Turns  - 1 x daily - 7 x weekly - 3 sets - 3 reps - Corner Balance Feet Together: Eyes Closed With Head Turns  - 1 x daily - 7 x weekly - 3 sets - 3 reps - Seated Cervical Retraction  - 1 x daily - 7 x weekly - 2-5 sec hold - Seated Shoulder Row with Anchored Resistance  - 1 x daily - 7 x weekly - 3 sets - 10 reps - Supine to Right Sidelying Vestibular Habituation  - 1 x daily - 7 x weekly - 5-10 reps - Brandt-Daroff Vestibular Exercise  - 1 x daily - 5 x weekly - 2 sets - 3-5 reps   10/07/23 VESTIBULAR ASSESSMENT   GENERAL OBSERVATION: wears progressive lens bifocals, holds head in left lateral tilt    SYMPTOM BEHAVIOR:   Subjective history: hx of vertigo, reports feeling of fullness in right ear over the past week   Non-Vestibular symptoms: headaches   Type of dizziness: Spinning/Vertigo   Frequency: daily   Duration: seconds/minutes   Aggravating factors: Induced by position change: rolling to the right   Relieving factors: slow movements   Progression of symptoms: unchanged   OCULOMOTOR EXAM:   Ocular Alignment: normal   Ocular ROM: No Limitations   Spontaneous Nystagmus: absent   Gaze-Induced Nystagmus: absent   Smooth Pursuits: intact   Saccades: intact   Convergence/Divergence: 8 cm      VESTIBULAR - OCULAR REFLEX:    Slow VOR: Normal   VOR Cancellation: Normal   Head-Impulse Test: HIT Right: negative HIT Left: negative   Dynamic Visual Acuity: Not able to be assessed      POSITIONAL TESTING: Right Dix-Hallpike: no nystagmus Left Dix-Hallpike: no nystagmus Right Roll Test:  no nystagmus but reports brief spinning sensation Left Roll Test: no nystagmus      Note: Objective measures were completed at Evaluation unless otherwise noted.  DIAGNOSTIC FINDINGS: IMPRESSION: No acute intracranial abnormality.  COGNITION: Overall cognitive status: Within functional  limits for tasks assessed   SENSATION: Not tested Reports stocking distribution of symptoms in her feet Pain in left hand > right EDEMA:  Slight in bilat ankles  MUSCLE TONE:  NT  DTRs:  NT  POSTURE:  No Significant postural limitations  Cervical ROM:    Globally limited ROM extension > lateral flexion > rotation  STRENGTH:   LOWER EXTREMITY MMT:   BLE gross 4/5 strength  BED MOBILITY:  indep  TRANSFERS: Assistive device  utilized: None  Sit to stand: Complete Independence Stand to sit: Complete Independence Chair to chair: Complete Independence Floor:  NT  RAMP: NT  CURB: Modified independence  GAIT: Gait pattern:  very limited left knee flexion during gait due to antalgia left knee OA Distance walked:  Assistive device utilized: Single point cane and None Level of assistance: Modified independence Comments:   FUNCTIONAL TESTS:  5 times sit to stand: 22.47 sec Dynamic Gait Index: 10/24  M-CTSIB  Condition 1: Firm Surface, EO 30 Sec, Normal Sway  Condition 2: Firm Surface, EC 30 Sec, Mild Sway  Condition 3: Foam Surface, EO 30 Sec, Mild Sway  Condition 4: Foam Surface, EC 30 Sec, Moderate Sway        GOALS: Goals reviewed with patient? Yes  SHORT TERM GOALS: Target date: same as LTG    LONG TERM GOALS: Target date: 12/09/2023    The patient will be independent with HEP for gaze adaptation, habituation, balance, and general mobility. Baseline: reports limited compliance 10/28/23 Goal status: IN PROGRESS  2.  Demo improved postural stability per normal-mild sway condition 4 M-CTSIB to improve safety with ADL Baseline: mild sway 10/28/23 Goal status: IN PROGRESS 10/28/23  3.  Reduce risk for falls per score 19/24 Dynamic Gait Index Baseline: 10/24; 13/24 10/28/23 Goal status: IN PROGRESS 10/28/23  4.  Pt to report absence of positional dizziness to improve comfort and reduce risk for falls during mobility Baseline: upbeating torsional  nystagmus lasting ~15 sec in R sidelying test 10/28/23 Goal status: IN PROGRESS 10/28/23  5.  Patient to demonstrate at least 10 deg improvement in cervical AROM in all planes to improve ability to position head with CRM.  Baseline: see above  Goal status: IN PROGRESS  6.  Patient to report 50% improvement in ease of performing dental hygiene without neck restriction limiting.  Baseline: reports difficulty brushing teeth and gargling 10/28/23 Goal status: IN PROGRESS    ASSESSMENT:  CLINICAL IMPRESSION: Patient arrived to session with report of R sided HA. Worked on cervical MT to address soft tissue restriction and joint mobility. Patienyt tolerated this well and reported improvement in cervical stiffness after MT. Followed with gentle cervical stretching with patient requiring increased cues, thus did not add into HEP yet.   OBJECTIVE IMPAIRMENTS: Abnormal gait, decreased activity tolerance, decreased balance, difficulty walking, decreased ROM, decreased strength, dizziness, increased muscle spasms, improper body mechanics, and pain.   ACTIVITY LIMITATIONS: carrying, lifting, stairs, bed mobility, reach over head, and locomotion level  PARTICIPATION LIMITATIONS: meal prep, cleaning, interpersonal relationship, shopping, and exercise  PERSONAL FACTORS: Age, Time since onset of injury/illness/exacerbation, and 3+ comorbidities: PMH  are also affecting patient's functional outcome.   REHAB POTENTIAL: Excellent  CLINICAL DECISION MAKING: Evolving/moderate complexity  EVALUATION COMPLEXITY: Moderate   PLAN:  PT FREQUENCY: 2x/week for 3 weeks followed by 1x/week for 3 weeks.  PT DURATION: -  PLANNED INTERVENTIONS: 97110-Therapeutic exercises, 97530- Therapeutic activity, [id number]- Neuromuscular re-education, [phone]- Self Care, [id number]- Manual therapy, [phone]- Gait training, [phone]- Canalith repositioning, [phone]- Aquatic Therapy, Patient/Family education, Balance training, Stair training,  Joint mobilization, Spinal mobilization, Vestibular training, and DME instructions  PLAN FOR NEXT SESSION: initiate cervical ROM exercises, STM; goal is to improve cervical restriction to allow for improved motion to clear R posterior BPPV     Anette Guarneri, PT, DPT 11/02/23 11:48 AM  [facility] at [facility] [address], [address] [place], [place] [address] Phone # [phone] Fax # [phone]

## 2023-11-02 ENCOUNTER — Encounter: Payer: Self-pay | Admitting: Physical Therapy

## 2023-11-02 ENCOUNTER — Ambulatory Visit: Payer: Medicare PPO | Admitting: Physical Therapy

## 2023-11-02 DIAGNOSIS — R2681 Unsteadiness on feet: Secondary | ICD-10-CM | POA: Diagnosis not present

## 2023-11-02 DIAGNOSIS — R262 Difficulty in walking, not elsewhere classified: Secondary | ICD-10-CM | POA: Diagnosis not present

## 2023-11-02 DIAGNOSIS — M542 Cervicalgia: Secondary | ICD-10-CM

## 2023-11-02 DIAGNOSIS — R2689 Other abnormalities of gait and mobility: Secondary | ICD-10-CM

## 2023-11-02 DIAGNOSIS — R42 Dizziness and giddiness: Secondary | ICD-10-CM | POA: Diagnosis not present

## 2023-11-03 NOTE — Therapy (Signed)
OUTPATIENT PHYSICAL THERAPY VESTIBULAR NOTE     Patient Name: Christina Mcclain MRN: [id number] DOB:[birth year], 67 y.o., female Today's Date: 11/04/2023  END OF SESSION:  PT End of Session - 11/04/23 0926     Visit Number 7    Number of Visits 14    Date for PT Re-Evaluation 12/09/23    Authorization Type [employer]    Authorization Time Period approved total of 14 PT visits from 09/30/2023 - 12/09/2023   new auth submitted   Authorization - Visit Number 7    Authorization - Number of Visits 14    PT Start Time 0857   pt late   PT Stop Time 0928    PT Time Calculation (min) 31 min    Activity Tolerance Patient tolerated treatment well    Behavior During Therapy [facility] for tasks assessed/performed                  Past Medical History:  Diagnosis Date   Adverse effect of anesthesia    hard to awaken   Anemia    Arthritis    Asthma    Asthma without status asthmaticus    unspecified; Take advair each night   Breast cancer (HCC) 2021   right breast IMC   Bronchitis    Chronic kidney disease    stage III per dr Cherylann Ratel   Constipation    Diabetes (HCC) 2010   Diabetes mellitus type 2, uncomplicated (HCC)    Diabetes mellitus without complication (HCC)    Encounter for blood transfusion    hysterectomy 9-10 years ago   Heart murmur    unspecified   Hyperlipidemia    Hypertension 1991   Hypothyroid    Multiple thyroid nodules    Neuromuscular disorder (HCC)    neuropathy   Osteoarthritis of right hip 02/04/2017   Palpitations    Pernicious anemia    Personal history of chemotherapy 2020-2021   Right breast ca   Personal history of radiation therapy 2021   right breast [facility]   Pneumonia    Sleep apnea    Use C-PAP; can't use mouth piece. Anxiety   Thyroid disease    nodules   Thyroid nodule    x2   Urinary urgency    Past Surgical History:  Procedure Laterality Date   ABDOMINAL HYSTERECTOMY  2011   partial   ABDOMINAL SURGERY     BREAST  BIOPSY Right 10/02/2020   affirm bx, x marker, fat necrosis   BREAST BIOPSY Right 10/19/2019   two masses at 9:00 [place] bx, coil marker and venus marker, IMC for both   BREAST LUMPECTOMY Right 03/28/2020   bracketing of two IMC masses at 9:00.  Venus marker was pushed medially during wire loc. Remains in breast.  Clear surgical margins. Negative Lymph node   CARPAL TUNNEL RELEASE Left 2008   CARPAL TUNNEL RELEASE Right    CHOLECYSTECTOMY  1982   COLONOSCOPY WITH PROPOFOL N/A 10/05/2017   Procedure: COLONOSCOPY WITH PROPOFOL;  Surgeon: Scot Jun, MD;  Location: [facility] ENDOSCOPY;  Service: Endoscopy;  Laterality: N/A;   COLONOSCOPY WITH PROPOFOL N/A 08/09/2021   Procedure: COLONOSCOPY WITH PROPOFOL;  Surgeon: Jaynie Collins, DO;  Location: [facility] ENDOSCOPY;  Service: Gastroenterology;  Laterality: N/A;   DIAGNOSTIC LAPAROSCOPY     ESOPHAGOGASTRODUODENOSCOPY (EGD) WITH PROPOFOL N/A 08/09/2021   Procedure: ESOPHAGOGASTRODUODENOSCOPY (EGD) WITH PROPOFOL;  Surgeon: Jaynie Collins, DO;  Location: [facility] ENDOSCOPY;  Service: Gastroenterology;  Laterality: N/A;   JOINT  REPLACEMENT Right 02/09/2017   TOTAL HIP REPLACEMENT, DR. CARTER. [place]   KNEE ARTHROSCOPY  1990s   PARTIAL MASTECTOMY WITH NEEDLE LOCALIZATION AND AXILLARY SENTINEL LYMPH NODE BX Right 03/28/2020   Procedure: PARTIAL MASTECTOMY WITH NEEDLE LOCALIZATION AND AXILLARY SENTINEL LYMPH NODE BX;  Surgeon: Carolan Shiver, MD;  Location: [facility] ORS;  Service: General;  Laterality: Right;   PORTACATH PLACEMENT Left 11/07/2019   Procedure: INSERTION PORT-A-CATH;  Surgeon: Carolan Shiver, MD;  Location: [facility] ORS;  Service: General;  Laterality: Left;   TOTAL HIP ARTHROPLASTY Left 06/23/2017   Procedure: TOTAL HIP ARTHROPLASTY ANTERIOR APPROACH;  Surgeon: Kennedy Bucker, MD;  Location: [facility] ORS;  Service: Orthopedics;  Laterality: Left;   Patient Active Problem List   Diagnosis Date Noted   Hypertension associated with  diabetes (HCC) 10/16/2023   Combined hyperlipidemia associated with type 2 diabetes mellitus (HCC) 10/16/2023   SOB (shortness of breath) 08/11/2023   Urinary tract infection without hematuria 04/09/2023   Mixed hyperlipidemia 04/09/2023   Rash of face 03/17/2023   Absolute anemia 03/17/2023   Palpitations 11/09/2019   Pelvic pressure in female 11/09/2019   Invasive carcinoma of breast (HCC) 10/31/2019   Goals of care, counseling/discussion 10/31/2019   Nonintractable headache 08/03/2019   Tinnitus of both ears 08/03/2019   Bilateral hand numbness 09/14/2018   Bilateral hand pain 09/14/2018   Myalgia due to statin 08/26/2018   BMI 40.0-44.9, adult (HCC) 12/16/2017   Primary localized osteoarthrosis of left hip 06/23/2017   Type 2 diabetes mellitus with stage 3b chronic kidney disease, without long-term current use of insulin (HCC) 03/17/2017   Pernicious anemia 03/17/2017   Primary osteoarthritis involving multiple joints 03/17/2017   Status post right hip replacement 02/20/2017   At high risk for falls 02/11/2017   Mild persistent asthma without complication 02/11/2017   Impaired mobility and ADLs 02/11/2017   Myoclonus 07/23/2016   DISH (diffuse idiopathic skeletal hyperostosis) 07/23/2016   Obstructive sleep apnea syndrome 07/23/2016   Thyromegaly 07/23/2016   Left thyroid nodule 09/04/2014   Chest pain, unspecified 11/14/2012   High cholesterol 11/14/2012   Gastro-esophageal reflux disease without esophagitis 11/14/2012   Family history of early CAD 11/14/2012   Overactive bladder 04/12/2012   Hemorrhoids 01/08/2010   Essential hypertension, benign 01/08/2010   Osteoarthritis of right hip 01/08/2010    PCP: Margaretann Loveless, MD REFERRING PROVIDER: Janice Coffin, PA-C  REFERRING DIAG: G62.9 (ICD-10-CM) - Polyneuropathy, unspecified R26.89 (ICD-10-CM) - Other abnormalities of gait and mobility  THERAPY DIAG:  Unsteadiness on feet  Difficulty in walking, not elsewhere  classified  Other abnormalities of gait and mobility  Dizziness and giddiness  Cervicalgia  ONSET DATE: worse past 6 months  Rationale for Evaluation and Treatment: Rehabilitation  SUBJECTIVE:   SUBJECTIVE STATEMENT: "Rushing. Neck is a little sore this AM."   Pt accompanied by: self  PERTINENT HISTORY: Vertigo, several minutes, sudden onset in supine position Sudden onset vertigo in the supine position 09/15/2023, resolved. Headache this morning, resolved by time of appointment. R TKR, left knee OA  PAIN:  Are you having pain? Yes: NPRS scale: 2/10 Pain location: neck Pain description: sore Aggravating factors: nothing Relieving factors: turning head a certain way  PRECAUTIONS: Fall  RED FLAGS: None   WEIGHT BEARING RESTRICTIONS: No  FALLS: Has patient fallen in last 6 months? No  LIVING ENVIRONMENT: Lives with: lives with their spouse Lives in: House/apartment Stairs: No Has following equipment at home: Single point cane  PLOF: Independent with basic ADLs and Independent  with household mobility without device  PATIENT GOALS:   OBJECTIVE:     TODAY'S TREATMENT: 11/04/23 Activity Comments  review of R brandt daroff 1x  R upbeating torsional nystagmus lasting 20 sec  sitting STM and manual TPR and IASTM  Palpable trigger points and soft tissue restriction in L>R SCM, UT, LS  cervical rotation SNAG 10x  Review of proper hand placement; trunk compensation   cervical extension SNAG 10x  To tolerance ; ROM improved with increased reps     PATIENT EDUCATION: Education details: provided handout on DN; HEP update and provided edu on intended sx with exercises- advised to stop if pain lasts hours/days; edu on MHP to neck for 10 min before/after HEP Person educated: Patient Education method: Explanation and Handouts Education comprehension: verbalized understanding   HOME EXERCISE PROGRAM: Access Code: [email or web address] URL:  [email or web address] Date: 10/14/2023 Prepared by: [facility] - Outpatient  Rehab - [facility]  Exercises - Corner Balance Feet Together With Eyes Open  - 1 x daily - 7 x weekly - 3 sets - 30 sec hold - Corner Balance Feet Together With Eyes Closed  - 1 x daily - 7 x weekly - 3 sets - 30 sec hold - Corner Balance Feet Together: Eyes Open With Head Turns  - 1 x daily - 7 x weekly - 3 sets - 3 reps - Corner Balance Feet Together: Eyes Closed With Head Turns  - 1 x daily - 7 x weekly - 3 sets - 3 reps - Seated Cervical Retraction  - 1 x daily - 7 x weekly - 2-5 sec hold - Seated Shoulder Row with Anchored Resistance  - 1 x daily - 7 x weekly - 3 sets - 10 reps - Supine to Right Sidelying Vestibular Habituation  - 1 x daily - 7 x weekly - 5-10 reps - Brandt-Daroff Vestibular Exercise  - 1 x daily - 5 x weekly - 2 sets - 3-5 reps   10/07/23 VESTIBULAR ASSESSMENT   GENERAL OBSERVATION: wears progressive lens bifocals, holds head in left lateral tilt    SYMPTOM BEHAVIOR:   Subjective history: hx of vertigo, reports feeling of fullness in right ear over the past week   Non-Vestibular symptoms: headaches   Type of dizziness: Spinning/Vertigo   Frequency: daily   Duration: seconds/minutes   Aggravating factors: Induced by position change: rolling to the right   Relieving factors: slow movements   Progression of symptoms: unchanged   OCULOMOTOR EXAM:   Ocular Alignment: normal   Ocular ROM: No Limitations   Spontaneous Nystagmus: absent   Gaze-Induced Nystagmus: absent   Smooth Pursuits: intact   Saccades: intact   Convergence/Divergence: 8 cm      VESTIBULAR - OCULAR REFLEX:    Slow VOR: Normal   VOR Cancellation: Normal   Head-Impulse Test: HIT Right: negative HIT Left: negative   Dynamic Visual Acuity: Not able to be assessed      POSITIONAL TESTING: Right Dix-Hallpike: no nystagmus Left Dix-Hallpike: no nystagmus Right Roll Test:  no nystagmus but  reports brief spinning sensation Left Roll Test: no nystagmus      Note: Objective measures were completed at Evaluation unless otherwise noted.  DIAGNOSTIC FINDINGS: IMPRESSION: No acute intracranial abnormality.  COGNITION: Overall cognitive status: Within functional limits for tasks assessed   SENSATION: Not tested Reports stocking distribution of symptoms in her feet Pain in left hand > right EDEMA:  Slight in bilat ankles  MUSCLE TONE:  NT  DTRs:  NT  POSTURE:  No Significant postural limitations  Cervical ROM:    Globally limited ROM extension > lateral flexion > rotation  STRENGTH:   LOWER EXTREMITY MMT:   BLE gross 4/5 strength  BED MOBILITY:  indep  TRANSFERS: Assistive device utilized: None  Sit to stand: Complete Independence Stand to sit: Complete Independence Chair to chair: Complete Independence Floor:  NT  RAMP: NT  CURB: Modified independence  GAIT: Gait pattern:  very limited left knee flexion during gait due to antalgia left knee OA Distance walked:  Assistive device utilized: Single point cane and None Level of assistance: Modified independence Comments:   FUNCTIONAL TESTS:  5 times sit to stand: 22.47 sec Dynamic Gait Index: 10/24  M-CTSIB  Condition 1: Firm Surface, EO 30 Sec, Normal Sway  Condition 2: Firm Surface, EC 30 Sec, Mild Sway  Condition 3: Foam Surface, EO 30 Sec, Mild Sway  Condition 4: Foam Surface, EC 30 Sec, Moderate Sway        GOALS: Goals reviewed with patient? Yes  SHORT TERM GOALS: Target date: same as LTG    LONG TERM GOALS: Target date: 12/09/2023    The patient will be independent with HEP for gaze adaptation, habituation, balance, and general mobility. Baseline: reports limited compliance 10/28/23 Goal status: IN PROGRESS  2.  Demo improved postural stability per normal-mild sway condition 4 M-CTSIB to improve safety with ADL Baseline: mild sway 10/28/23 Goal status: IN PROGRESS  10/28/23  3.  Reduce risk for falls per score 19/24 Dynamic Gait Index Baseline: 10/24; 13/24 10/28/23 Goal status: IN PROGRESS 10/28/23  4.  Pt to report absence of positional dizziness to improve comfort and reduce risk for falls during mobility Baseline: upbeating torsional nystagmus lasting ~15 sec in R sidelying test 10/28/23 Goal status: IN PROGRESS 10/28/23  5.  Patient to demonstrate at least 10 deg improvement in cervical AROM in all planes to improve ability to position head with CRM.  Baseline: see above  Goal status: IN PROGRESS  6.  Patient to report 50% improvement in ease of performing dental hygiene without neck restriction limiting.  Baseline: reports difficulty brushing teeth and gargling 10/28/23 Goal status: IN PROGRESS    ASSESSMENT:  CLINICAL IMPRESSION: Patient arrived to session with report of mild cervical soreness. Continued with MT to address pain and restriction. Patient tolerated well. Proceeded with cervical SNAGs- good observable improvement in cervical extension after these stretches. Provided edu on DN as this may be a modality beneficial to improving ROM. Patient tolerated session well; noted remaining cervical soreness upon leaving.   OBJECTIVE IMPAIRMENTS: Abnormal gait, decreased activity tolerance, decreased balance, difficulty walking, decreased ROM, decreased strength, dizziness, increased muscle spasms, improper body mechanics, and pain.   ACTIVITY LIMITATIONS: carrying, lifting, stairs, bed mobility, reach over head, and locomotion level  PARTICIPATION LIMITATIONS: meal prep, cleaning, interpersonal relationship, shopping, and exercise  PERSONAL FACTORS: Age, Time since onset of injury/illness/exacerbation, and 3+ comorbidities: PMH  are also affecting patient's functional outcome.   REHAB POTENTIAL: Excellent  CLINICAL DECISION MAKING: Evolving/moderate complexity  EVALUATION COMPLEXITY: Moderate   PLAN:  PT FREQUENCY: 2x/week for 3 weeks  followed by 1x/week for 3 weeks.  PT DURATION: -  PLANNED INTERVENTIONS: 97110-Therapeutic exercises, 97530- Therapeutic activity, [id number]- Neuromuscular re-education, [phone]- Self Care, [id number]- Manual therapy, [phone]- Gait training, [phone]- Canalith repositioning, [phone]- Aquatic Therapy, Patient/Family education, Balance training, Stair training, Joint mobilization, Spinal mobilization, Vestibular training, and DME instructions  PLAN FOR NEXT SESSION: cervical ROM exercises, STM; goal is to  improve cervical restriction to allow for improved motion to clear R posterior BPPV     Baldemar Friday, PT, DPT 11/04/23 9:30 AM  [facility] at [facility] [address] [place], [address] [place], [place] [address] Phone # [phone] Fax # [phone]

## 2023-11-04 ENCOUNTER — Encounter: Payer: Self-pay | Admitting: Physical Therapy

## 2023-11-04 ENCOUNTER — Ambulatory Visit: Payer: Medicare PPO | Admitting: Physical Therapy

## 2023-11-04 DIAGNOSIS — R262 Difficulty in walking, not elsewhere classified: Secondary | ICD-10-CM | POA: Diagnosis not present

## 2023-11-04 DIAGNOSIS — R2689 Other abnormalities of gait and mobility: Secondary | ICD-10-CM

## 2023-11-04 DIAGNOSIS — M542 Cervicalgia: Secondary | ICD-10-CM | POA: Diagnosis not present

## 2023-11-04 DIAGNOSIS — R2681 Unsteadiness on feet: Secondary | ICD-10-CM

## 2023-11-04 DIAGNOSIS — R42 Dizziness and giddiness: Secondary | ICD-10-CM

## 2023-11-04 NOTE — Patient Instructions (Signed)

## 2023-11-05 NOTE — Therapy (Signed)
OUTPATIENT PHYSICAL THERAPY VESTIBULAR NOTE     Patient Name: Christina Mcclain MRN: [id number] DOB:08/17/1956, 67 y.o., female Today's Date: 11/09/2023  END OF SESSION:  PT End of Session - 11/09/23 1149     Visit Number 8    Number of Visits 14    Date for PT Re-Evaluation 12/09/23    Authorization Type [employer]    Authorization Time Period approved total of 14 PT visits from 09/30/2023 - 12/09/2023   new auth submitted   Authorization - Visit Number 8    Authorization - Number of Visits 14    PT Start Time 1112   pt late   PT Stop Time 1145    PT Time Calculation (min) 33 min    Activity Tolerance Patient tolerated treatment well    Behavior During Therapy WFL for tasks assessed/performed                   Past Medical History:  Diagnosis Date   Adverse effect of anesthesia    hard to awaken   Anemia    Arthritis    Asthma    Asthma without status asthmaticus    unspecified; Take advair each night   Breast cancer (HCC) 2021   right breast IMC   Bronchitis    Chronic kidney disease    stage III per dr Cherylann Ratel   Constipation    Diabetes (HCC) 2010   Diabetes mellitus type 2, uncomplicated (HCC)    Diabetes mellitus without complication (HCC)    Encounter for blood transfusion    hysterectomy 9-10 years ago   Heart murmur    unspecified   Hyperlipidemia    Hypertension 1991   Hypothyroid    Multiple thyroid nodules    Neuromuscular disorder (HCC)    neuropathy   Osteoarthritis of right hip 02/04/2017   Palpitations    Pernicious anemia    Personal history of chemotherapy 2020-2021   Right breast ca   Personal history of radiation therapy 2021   right breast [facility]   Pneumonia    Sleep apnea    Use C-PAP; can't use mouth piece. Anxiety   Thyroid disease    nodules   Thyroid nodule    x2   Urinary urgency    Past Surgical History:  Procedure Laterality Date   ABDOMINAL HYSTERECTOMY  2011   partial   ABDOMINAL SURGERY     BREAST  BIOPSY Right 10/02/2020   affirm bx, x marker, fat necrosis   BREAST BIOPSY Right 10/19/2019   two masses at 9:00 [place] bx, coil marker and venus marker, IMC for both   BREAST LUMPECTOMY Right 03/28/2020   bracketing of two IMC masses at 9:00.  Venus marker was pushed medially during wire loc. Remains in breast.  Clear surgical margins. Negative Lymph node   CARPAL TUNNEL RELEASE Left 2008   CARPAL TUNNEL RELEASE Right    CHOLECYSTECTOMY  1982   COLONOSCOPY WITH PROPOFOL N/A 10/05/2017   Procedure: COLONOSCOPY WITH PROPOFOL;  Surgeon: Scot Jun, MD;  Location: [facility] ENDOSCOPY;  Service: Endoscopy;  Laterality: N/A;   COLONOSCOPY WITH PROPOFOL N/A 08/09/2021   Procedure: COLONOSCOPY WITH PROPOFOL;  Surgeon: Jaynie Collins, DO;  Location: [facility] ENDOSCOPY;  Service: Gastroenterology;  Laterality: N/A;   DIAGNOSTIC LAPAROSCOPY     ESOPHAGOGASTRODUODENOSCOPY (EGD) WITH PROPOFOL N/A 08/09/2021   Procedure: ESOPHAGOGASTRODUODENOSCOPY (EGD) WITH PROPOFOL;  Surgeon: Jaynie Collins, DO;  Location: [facility] ENDOSCOPY;  Service: Gastroenterology;  Laterality: N/A;  JOINT REPLACEMENT Right 02/09/2017   TOTAL HIP REPLACEMENT, DR. CARTER. [place]   KNEE ARTHROSCOPY  1990s   PARTIAL MASTECTOMY WITH NEEDLE LOCALIZATION AND AXILLARY SENTINEL LYMPH NODE BX Right 03/28/2020   Procedure: PARTIAL MASTECTOMY WITH NEEDLE LOCALIZATION AND AXILLARY SENTINEL LYMPH NODE BX;  Surgeon: Carolan Shiver, MD;  Location: [facility] ORS;  Service: General;  Laterality: Right;   PORTACATH PLACEMENT Left 11/07/2019   Procedure: INSERTION PORT-A-CATH;  Surgeon: Carolan Shiver, MD;  Location: [facility] ORS;  Service: General;  Laterality: Left;   TOTAL HIP ARTHROPLASTY Left 06/23/2017   Procedure: TOTAL HIP ARTHROPLASTY ANTERIOR APPROACH;  Surgeon: Kennedy Bucker, MD;  Location: [facility] ORS;  Service: Orthopedics;  Laterality: Left;   Patient Active Problem List   Diagnosis Date Noted   Hypertension associated with  diabetes (HCC) 10/16/2023   Combined hyperlipidemia associated with type 2 diabetes mellitus (HCC) 10/16/2023   SOB (shortness of breath) 08/11/2023   Urinary tract infection without hematuria 04/09/2023   Mixed hyperlipidemia 04/09/2023   Rash of face 03/17/2023   Absolute anemia 03/17/2023   Palpitations 11/09/2019   Pelvic pressure in female 11/09/2019   Invasive carcinoma of breast (HCC) 10/31/2019   Goals of care, counseling/discussion 10/31/2019   Nonintractable headache 08/03/2019   Tinnitus of both ears 08/03/2019   Bilateral hand numbness 09/14/2018   Bilateral hand pain 09/14/2018   Myalgia due to statin 08/26/2018   BMI 40.0-44.9, adult (HCC) 12/16/2017   Primary localized osteoarthrosis of left hip 06/23/2017   Type 2 diabetes mellitus with stage 3b chronic kidney disease, without long-term current use of insulin (HCC) 03/17/2017   Pernicious anemia 03/17/2017   Primary osteoarthritis involving multiple joints 03/17/2017   Status post right hip replacement 02/20/2017   At high risk for falls 02/11/2017   Mild persistent asthma without complication 02/11/2017   Impaired mobility and ADLs 02/11/2017   Myoclonus 07/23/2016   DISH (diffuse idiopathic skeletal hyperostosis) 07/23/2016   Obstructive sleep apnea syndrome 07/23/2016   Thyromegaly 07/23/2016   Left thyroid nodule 09/04/2014   Chest pain, unspecified 11/14/2012   High cholesterol 11/14/2012   Gastro-esophageal reflux disease without esophagitis 11/14/2012   Family history of early CAD 11/14/2012   Overactive bladder 04/12/2012   Hemorrhoids 01/08/2010   Essential hypertension, benign 01/08/2010   Osteoarthritis of right hip 01/08/2010    PCP: Margaretann Loveless, MD REFERRING PROVIDER: Janice Coffin, PA-C  REFERRING DIAG: G62.9 (ICD-10-CM) - Polyneuropathy, unspecified R26.89 (ICD-10-CM) - Other abnormalities of gait and mobility  THERAPY DIAG:  Unsteadiness on feet  Difficulty in walking, not elsewhere  classified  Other abnormalities of gait and mobility  Dizziness and giddiness  Cervicalgia  ONSET DATE: worse past 6 months  Rationale for Evaluation and Treatment: Rehabilitation  SUBJECTIVE:   SUBJECTIVE STATEMENT: "Rushing." Feeling a little bit lightheaded. Reports more dizziness upon sitting up than lying down.   Pt accompanied by: self  PERTINENT HISTORY: Vertigo, several minutes, sudden onset in supine position Sudden onset vertigo in the supine position 09/15/2023, resolved. Headache this morning, resolved by time of appointment. R TKR, left knee OA  PAIN:  Are you having pain? Yes: NPRS scale: 1/10 Pain location: forehead/sinuses  Pain description: pressure Aggravating factors: nothing Relieving factors: claritin   PRECAUTIONS: Fall  RED FLAGS: None   WEIGHT BEARING RESTRICTIONS: No  FALLS: Has patient fallen in last 6 months? No  LIVING ENVIRONMENT: Lives with: lives with their spouse Lives in: House/apartment Stairs: No Has following equipment at home: Single point cane  PLOF:  Independent with basic ADLs and Independent with household mobility without device  PATIENT GOALS:   OBJECTIVE:    TODAY'S TREATMENT: 11/09/23 Activity Comments  standing D2 flexion to cone on floor 2x5 each side No dizziness even when cueing for quicker pace   standing head turns to targets 2x30" Slow speed and limited ROM, compensating with trunk   standing head nods to targets 2x30" Good tolerance  R sidelying test Required correction of form d/t limited carryover of brandt daroff technique; R upbeating torsional nystagmus lasting ~15 sec   R universal respositioning maneuver In L sidelying, demonstrated small amplitude apogeotropic nystagmus which was delayed and lasted ~30 sec  R sidelying test VERY small amplitude nystagmus; pt asymptomatic lying down and sitting up    Upon standing up at end of appointment pt reported dizziness; encouraged her to sit out in waiting  room for a few minutes before leaving and escorted pt safely to waiting room      PATIENT EDUCATION: Education details: provided info and address for [facility] for vestibular rehab as it is closer to pt's home; answered pt's questions about vestibular anatomy and nystagmus  Person educated: Patient Education method: Explanation, Demonstration, and Handouts Education comprehension: verbalized understanding    HOME EXERCISE PROGRAM: Access Code: [email or web address] URL: [email or web address] Date: 10/14/2023 Prepared by: [facility] - Outpatient  Rehab - [facility]  Exercises - Corner Balance Feet Together With Eyes Open  - 1 x daily - 7 x weekly - 3 sets - 30 sec hold - Corner Balance Feet Together With Eyes Closed  - 1 x daily - 7 x weekly - 3 sets - 30 sec hold - Corner Balance Feet Together: Eyes Open With Head Turns  - 1 x daily - 7 x weekly - 3 sets - 3 reps - Corner Balance Feet Together: Eyes Closed With Head Turns  - 1 x daily - 7 x weekly - 3 sets - 3 reps - Seated Cervical Retraction  - 1 x daily - 7 x weekly - 2-5 sec hold - Seated Shoulder Row with Anchored Resistance  - 1 x daily - 7 x weekly - 3 sets - 10 reps - Supine to Right Sidelying Vestibular Habituation  - 1 x daily - 7 x weekly - 5-10 reps - Brandt-Daroff Vestibular Exercise  - 1 x daily - 5 x weekly - 2 sets - 3-5 reps   10/07/23 VESTIBULAR ASSESSMENT   GENERAL OBSERVATION: wears progressive lens bifocals, holds head in left lateral tilt    SYMPTOM BEHAVIOR:   Subjective history: hx of vertigo, reports feeling of fullness in right ear over the past week   Non-Vestibular symptoms: headaches   Type of dizziness: Spinning/Vertigo   Frequency: daily   Duration: seconds/minutes   Aggravating factors: Induced by position change: rolling to the right   Relieving factors: slow movements   Progression of symptoms: unchanged   OCULOMOTOR EXAM:   Ocular Alignment: normal   Ocular ROM: No  Limitations   Spontaneous Nystagmus: absent   Gaze-Induced Nystagmus: absent   Smooth Pursuits: intact   Saccades: intact   Convergence/Divergence: 8 cm      VESTIBULAR - OCULAR REFLEX:    Slow VOR: Normal   VOR Cancellation: Normal   Head-Impulse Test: HIT Right: negative HIT Left: negative   Dynamic Visual Acuity: Not able to be assessed      POSITIONAL TESTING: Right Dix-Hallpike: no nystagmus Left Dix-Hallpike: no nystagmus Right Roll Test:  no nystagmus  but reports brief spinning sensation Left Roll Test: no nystagmus      Note: Objective measures were completed at Evaluation unless otherwise noted.  DIAGNOSTIC FINDINGS: IMPRESSION: No acute intracranial abnormality.  COGNITION: Overall cognitive status: Within functional limits for tasks assessed   SENSATION: Not tested Reports stocking distribution of symptoms in her feet Pain in left hand > right EDEMA:  Slight in bilat ankles  MUSCLE TONE:  NT  DTRs:  NT  POSTURE:  No Significant postural limitations  Cervical ROM:    Globally limited ROM extension > lateral flexion > rotation  STRENGTH:   LOWER EXTREMITY MMT:   BLE gross 4/5 strength  BED MOBILITY:  indep  TRANSFERS: Assistive device utilized: None  Sit to stand: Complete Independence Stand to sit: Complete Independence Chair to chair: Complete Independence Floor:  NT  RAMP: NT  CURB: Modified independence  GAIT: Gait pattern:  very limited left knee flexion during gait due to antalgia left knee OA Distance walked:  Assistive device utilized: Single point cane and None Level of assistance: Modified independence Comments:   FUNCTIONAL TESTS:  5 times sit to stand: 22.47 sec Dynamic Gait Index: 10/24  M-CTSIB  Condition 1: Firm Surface, EO 30 Sec, Normal Sway  Condition 2: Firm Surface, EC 30 Sec, Mild Sway  Condition 3: Foam Surface, EO 30 Sec, Mild Sway  Condition 4: Foam Surface, EC 30 Sec, Moderate Sway         GOALS: Goals reviewed with patient? Yes  SHORT TERM GOALS: Target date: same as LTG    LONG TERM GOALS: Target date: 12/09/2023    The patient will be independent with HEP for gaze adaptation, habituation, balance, and general mobility. Baseline: reports limited compliance 10/28/23 Goal status: IN PROGRESS  2.  Demo improved postural stability per normal-mild sway condition 4 M-CTSIB to improve safety with ADL Baseline: mild sway 10/28/23 Goal status: IN PROGRESS 10/28/23  3.  Reduce risk for falls per score 19/24 Dynamic Gait Index Baseline: 10/24; 13/24 10/28/23 Goal status: IN PROGRESS 10/28/23  4.  Pt to report absence of positional dizziness to improve comfort and reduce risk for falls during mobility Baseline: upbeating torsional nystagmus lasting ~15 sec in R sidelying test 10/28/23 Goal status: IN PROGRESS 10/28/23  5.  Patient to demonstrate at least 10 deg improvement in cervical AROM in all planes to improve ability to position head with CRM.  Baseline: see above  Goal status: IN PROGRESS  6.  Patient to report 50% improvement in ease of performing dental hygiene without neck restriction limiting.  Baseline: reports difficulty brushing teeth and gargling 10/28/23 Goal status: IN PROGRESS    ASSESSMENT:  CLINICAL IMPRESSION: Patient arrived to session with report of slight lightheadedness this AM. Habituation activities were performed with evident remaining cervical ROM limitation but no dizziness. Positional testing revealed remaining R posterior canalithiasis and possible R horizontal Cupulolithiasis. Treated with R universal repositioning maneuver with good improvement in symptoms. However, patient with brief c/o dizziness ipon sitting up to leave. Was escorted safely to waiting room. Spoke to patient about transferring to [facility] at [facility] for vestibular rehab as patient has difficulty getting to appointments on time and this clinic is closer to her home. She is  agreeable to this plan.    OBJECTIVE IMPAIRMENTS: Abnormal gait, decreased activity tolerance, decreased balance, difficulty walking, decreased ROM, decreased strength, dizziness, increased muscle spasms, improper body mechanics, and pain.   ACTIVITY LIMITATIONS: carrying, lifting, stairs, bed mobility, reach over head, and  locomotion level  PARTICIPATION LIMITATIONS: meal prep, cleaning, interpersonal relationship, shopping, and exercise  PERSONAL FACTORS: Age, Time since onset of injury/illness/exacerbation, and 3+ comorbidities: PMH  are also affecting patient's functional outcome.   REHAB POTENTIAL: Excellent  CLINICAL DECISION MAKING: Evolving/moderate complexity  EVALUATION COMPLEXITY: Moderate   PLAN:  PT FREQUENCY: 2x/week for 3 weeks followed by 1x/week for 3 weeks.  PT DURATION: -  PLANNED INTERVENTIONS: 97110-Therapeutic exercises, 97530- Therapeutic activity, [id number]- Neuromuscular re-education, [phone]- Self Care, [id number]- Manual therapy, [phone]- Gait training, [phone]- Canalith repositioning, [phone]- Aquatic Therapy, Patient/Family education, Balance training, Stair training, Joint mobilization, Spinal mobilization, Vestibular training, and DME instructions  PLAN FOR NEXT SESSION: cervical ROM exercises, STM; goal is to improve cervical restriction to allow for improved motion to clear R posterior BPPV     Baldemar Friday, PT, DPT 11/09/23 11:50 AM  [facility] at [facility] [address] [place], [place] [address] Phone # [phone] Fax # [phone]

## 2023-11-09 ENCOUNTER — Encounter: Payer: Self-pay | Admitting: Physical Therapy

## 2023-11-09 ENCOUNTER — Ambulatory Visit: Payer: Medicare PPO | Admitting: Physical Therapy

## 2023-11-09 DIAGNOSIS — R42 Dizziness and giddiness: Secondary | ICD-10-CM | POA: Diagnosis not present

## 2023-11-09 DIAGNOSIS — R2689 Other abnormalities of gait and mobility: Secondary | ICD-10-CM | POA: Diagnosis not present

## 2023-11-09 DIAGNOSIS — R2681 Unsteadiness on feet: Secondary | ICD-10-CM

## 2023-11-09 DIAGNOSIS — R262 Difficulty in walking, not elsewhere classified: Secondary | ICD-10-CM | POA: Diagnosis not present

## 2023-11-09 DIAGNOSIS — M542 Cervicalgia: Secondary | ICD-10-CM

## 2023-11-10 ENCOUNTER — Ambulatory Visit: Payer: Medicare PPO | Admitting: Cardiovascular Disease

## 2023-11-10 DIAGNOSIS — L03032 Cellulitis of left toe: Secondary | ICD-10-CM | POA: Diagnosis not present

## 2023-11-10 DIAGNOSIS — M79675 Pain in left toe(s): Secondary | ICD-10-CM | POA: Diagnosis not present

## 2023-11-10 DIAGNOSIS — B351 Tinea unguium: Secondary | ICD-10-CM | POA: Diagnosis not present

## 2023-11-10 DIAGNOSIS — M79674 Pain in right toe(s): Secondary | ICD-10-CM | POA: Diagnosis not present

## 2023-11-10 NOTE — Therapy (Signed)
OUTPATIENT PHYSICAL THERAPY VESTIBULAR NOTE     Patient Name: Christina Mcclain MRN: [id number] DOB:1955-12-26, 67 y.o., female Today's Date: 11/11/2023  END OF SESSION:  PT End of Session - 11/11/23 1148     Visit Number 9    Number of Visits 14    Date for PT Re-Evaluation 12/09/23    Authorization Type [employer]    Authorization Time Period approved total of 14 PT visits from 09/30/2023 - 12/09/2023   new auth submitted   Authorization - Visit Number 9    Authorization - Number of Visits 14    PT Start Time 1113    PT Stop Time 1146    PT Time Calculation (min) 33 min    Activity Tolerance Patient tolerated treatment well    Behavior During Therapy WFL for tasks assessed/performed                    Past Medical History:  Diagnosis Date   Adverse effect of anesthesia    hard to awaken   Anemia    Arthritis    Asthma    Asthma without status asthmaticus    unspecified; Take advair each night   Breast cancer (HCC) 2021   right breast IMC   Bronchitis    Chronic kidney disease    stage III per dr Cherylann Ratel   Constipation    Diabetes (HCC) 2010   Diabetes mellitus type 2, uncomplicated (HCC)    Diabetes mellitus without complication (HCC)    Encounter for blood transfusion    hysterectomy 9-10 years ago   Heart murmur    unspecified   Hyperlipidemia    Hypertension 1991   Hypothyroid    Multiple thyroid nodules    Neuromuscular disorder (HCC)    neuropathy   Osteoarthritis of right hip 02/04/2017   Palpitations    Pernicious anemia    Personal history of chemotherapy 2020-2021   Right breast ca   Personal history of radiation therapy 2021   right breast [facility]   Pneumonia    Sleep apnea    Use C-PAP; can't use mouth piece. Anxiety   Thyroid disease    nodules   Thyroid nodule    x2   Urinary urgency    Past Surgical History:  Procedure Laterality Date   ABDOMINAL HYSTERECTOMY  2011   partial   ABDOMINAL SURGERY     BREAST BIOPSY  Right 10/02/2020   affirm bx, x marker, fat necrosis   BREAST BIOPSY Right 10/19/2019   two masses at 9:00 [place] bx, coil marker and venus marker, IMC for both   BREAST LUMPECTOMY Right 03/28/2020   bracketing of two IMC masses at 9:00.  Venus marker was pushed medially during wire loc. Remains in breast.  Clear surgical margins. Negative Lymph node   CARPAL TUNNEL RELEASE Left 2008   CARPAL TUNNEL RELEASE Right    CHOLECYSTECTOMY  1982   COLONOSCOPY WITH PROPOFOL N/A 10/05/2017   Procedure: COLONOSCOPY WITH PROPOFOL;  Surgeon: Scot Jun, MD;  Location[facility] ENDOSCOPY;  Service: Endoscopy;  Laterality: N/A;   COLONOSCOPY WITH PROPOFOL N/A 08/09/2021   Procedure: COLONOSCOPY WITH PROPOFOL;  Surgeon: Jaynie Collins, DO;  Location: [facility] ENDOSCOPY;  Service: Gastroenterology;  Laterality: N/A;   DIAGNOSTIC LAPAROSCOPY     ESOPHAGOGASTRODUODENOSCOPY (EGD) WITH PROPOFOL N/A 08/09/2021   Procedure: ESOPHAGOGASTRODUODENOSCOPY (EGD) WITH PROPOFOL;  Surgeon: Jaynie Collins, DO;  Location: [facility] ENDOSCOPY;  Service: Gastroenterology;  Laterality: N/A;   JOINT REPLACEMENT  Right 02/09/2017   TOTAL HIP REPLACEMENT, DR. CARTER. [place]   KNEE ARTHROSCOPY  1990s   PARTIAL MASTECTOMY WITH NEEDLE LOCALIZATION AND AXILLARY SENTINEL LYMPH NODE BX Right 03/28/2020   Procedure: PARTIAL MASTECTOMY WITH NEEDLE LOCALIZATION AND AXILLARY SENTINEL LYMPH NODE BX;  Surgeon: Carolan Shiver, MD;  Location: [facility] ORS;  Service: General;  Laterality: Right;   PORTACATH PLACEMENT Left 11/07/2019   Procedure: INSERTION PORT-A-CATH;  Surgeon: Carolan Shiver, MD;  Location: [facility] ORS;  Service: General;  Laterality: Left;   TOTAL HIP ARTHROPLASTY Left 06/23/2017   Procedure: TOTAL HIP ARTHROPLASTY ANTERIOR APPROACH;  Surgeon: Kennedy Bucker, MD;  Location: [facility] ORS;  Service: Orthopedics;  Laterality: Left;   Patient Active Problem List   Diagnosis Date Noted   Hypertension associated with diabetes  (HCC) 10/16/2023   Combined hyperlipidemia associated with type 2 diabetes mellitus (HCC) 10/16/2023   SOB (shortness of breath) 08/11/2023   Urinary tract infection without hematuria 04/09/2023   Mixed hyperlipidemia 04/09/2023   Rash of face 03/17/2023   Absolute anemia 03/17/2023   Palpitations 11/09/2019   Pelvic pressure in female 11/09/2019   Invasive carcinoma of breast (HCC) 10/31/2019   Goals of care, counseling/discussion 10/31/2019   Nonintractable headache 08/03/2019   Tinnitus of both ears 08/03/2019   Bilateral hand numbness 09/14/2018   Bilateral hand pain 09/14/2018   Myalgia due to statin 08/26/2018   BMI 40.0-44.9, adult (HCC) 12/16/2017   Primary localized osteoarthrosis of left hip 06/23/2017   Type 2 diabetes mellitus with stage 3b chronic kidney disease, without long-term current use of insulin (HCC) 03/17/2017   Pernicious anemia 03/17/2017   Primary osteoarthritis involving multiple joints 03/17/2017   Status post right hip replacement 02/20/2017   At high risk for falls 02/11/2017   Mild persistent asthma without complication 02/11/2017   Impaired mobility and ADLs 02/11/2017   Myoclonus 07/23/2016   DISH (diffuse idiopathic skeletal hyperostosis) 07/23/2016   Obstructive sleep apnea syndrome 07/23/2016   Thyromegaly 07/23/2016   Left thyroid nodule 09/04/2014   Chest pain, unspecified 11/14/2012   High cholesterol 11/14/2012   Gastro-esophageal reflux disease without esophagitis 11/14/2012   Family history of early CAD 11/14/2012   Overactive bladder 04/12/2012   Hemorrhoids 01/08/2010   Essential hypertension, benign 01/08/2010   Osteoarthritis of right hip 01/08/2010    PCP: Margaretann Loveless, MD REFERRING PROVIDER: Janice Coffin, PA-C  REFERRING DIAG: G62.9 (ICD-10-CM) - Polyneuropathy, unspecified R26.89 (ICD-10-CM) - Other abnormalities of gait and mobility  THERAPY DIAG:  Unsteadiness on feet  Difficulty in walking, not elsewhere  classified  Other abnormalities of gait and mobility  Dizziness and giddiness  Cervicalgia  ONSET DATE: worse past 6 months  Rationale for Evaluation and Treatment: Rehabilitation  SUBJECTIVE:   SUBJECTIVE STATEMENT: Having some sniffles. Has not heard from clinic in [place] but thinks that she would rather continue with PT here and work on getting her on time. Was having a HA on the R side of the head which would increase with head rotation. Did her dizizness exercises yesterday which caused dizziness on the 1st rep, less with increased reps.   Pt accompanied by: self  PERTINENT HISTORY: Vertigo, several minutes, sudden onset in supine position Sudden onset vertigo in the supine position 09/15/2023, resolved. Headache this morning, resolved by time of appointment. R TKR, left knee OA  PAIN:  Are you having pain? Yes: NPRS scale: "no"/10 Pain location: forehead/sinuses  Pain description: pressure Aggravating factors: nothing Relieving factors: claritin   PRECAUTIONS: Fall  RED FLAGS: None   WEIGHT BEARING RESTRICTIONS: No  FALLS: Has patient fallen in last 6 months? No  LIVING ENVIRONMENT: Lives with: lives with their spouse Lives in: House/apartment Stairs: No Has following equipment at home: Single point cane  PLOF: Independent with basic ADLs and Independent with household mobility without device  PATIENT GOALS:   OBJECTIVE:     TODAY'S TREATMENT: 11/11/23 Activity Comments  R roll test negative  L roll test negative  R sidelying test negative  R sidelying re-test 2 beats R upbeating torsional nystagmus; pt reports "a little bit woozy"  R universal repositioning maneuver  R upbeating torsional nystagmus lasting 10 sec in position 1, latent L upbeating torsional nystagmus lasting 10 sec in position 2; tolerated well  R sidelying test Negative; c/o delayed "wooziness" upon sitting up   sitting STM and manual TPR to R side of neck Palpable soft tissue  restriction throughout, worse in R SCM and cervical parapsinals with TTP    PATIENT EDUCATION: Education details: edu on preventable BPPV risk factors, plan to address L ear next time  Person educated: Patient Education method: Explanation Education comprehension: verbalized understanding   HOME EXERCISE PROGRAM: Access Code: [email or web address] URL: [email or web address] Date: 10/14/2023 Prepared by: [facility] Outpatient  Rehab - [facility]  Exercises - Corner Balance Feet Together With Eyes Open  - 1 x daily - 7 x weekly - 3 sets - 30 sec hold - Corner Balance Feet Together With Eyes Closed  - 1 x daily - 7 x weekly - 3 sets - 30 sec hold - Corner Balance Feet Together: Eyes Open With Head Turns  - 1 x daily - 7 x weekly - 3 sets - 3 reps - Corner Balance Feet Together: Eyes Closed With Head Turns  - 1 x daily - 7 x weekly - 3 sets - 3 reps - Seated Cervical Retraction  - 1 x daily - 7 x weekly - 2-5 sec hold - Seated Shoulder Row with Anchored Resistance  - 1 x daily - 7 x weekly - 3 sets - 10 reps - Supine to Right Sidelying Vestibular Habituation  - 1 x daily - 7 x weekly - 5-10 reps - Brandt-Daroff Vestibular Exercise  - 1 x daily - 5 x weekly - 2 sets - 3-5 reps   10/07/23 VESTIBULAR ASSESSMENT   GENERAL OBSERVATION: wears progressive lens bifocals, holds head in left lateral tilt    SYMPTOM BEHAVIOR:   Subjective history: hx of vertigo, reports feeling of fullness in right ear over the past week   Non-Vestibular symptoms: headaches   Type of dizziness: Spinning/Vertigo   Frequency: daily   Duration: seconds/minutes   Aggravating factors: Induced by position change: rolling to the right   Relieving factors: slow movements   Progression of symptoms: unchanged   OCULOMOTOR EXAM:   Ocular Alignment: normal   Ocular ROM: No Limitations   Spontaneous Nystagmus: absent   Gaze-Induced Nystagmus: absent   Smooth Pursuits: intact   Saccades:  intact   Convergence/Divergence: 8 cm      VESTIBULAR - OCULAR REFLEX:    Slow VOR: Normal   VOR Cancellation: Normal   Head-Impulse Test: HIT Right: negative HIT Left: negative   Dynamic Visual Acuity: Not able to be assessed      POSITIONAL TESTING: Right Dix-Hallpike: no nystagmus Left Dix-Hallpike: no nystagmus Right Roll Test:  no nystagmus but reports brief spinning sensation Left Roll Test: no nystagmus      Note:  Objective measures were completed at Evaluation unless otherwise noted.  DIAGNOSTIC FINDINGS: IMPRESSION: No acute intracranial abnormality.  COGNITION: Overall cognitive status: Within functional limits for tasks assessed   SENSATION: Not tested Reports stocking distribution of symptoms in her feet Pain in left hand > right EDEMA:  Slight in bilat ankles  MUSCLE TONE:  NT  DTRs:  NT  POSTURE:  No Significant postural limitations  Cervical ROM:    Globally limited ROM extension > lateral flexion > rotation  STRENGTH:   LOWER EXTREMITY MMT:   BLE gross 4/5 strength  BED MOBILITY:  indep  TRANSFERS: Assistive device utilized: None  Sit to stand: Complete Independence Stand to sit: Complete Independence Chair to chair: Complete Independence Floor:  NT  RAMP: NT  CURB: Modified independence  GAIT: Gait pattern:  very limited left knee flexion during gait due to antalgia left knee OA Distance walked:  Assistive device utilized: Single point cane and None Level of assistance: Modified independence Comments:   FUNCTIONAL TESTS:  5 times sit to stand: 22.47 sec Dynamic Gait Index: 10/24  M-CTSIB  Condition 1: Firm Surface, EO 30 Sec, Normal Sway  Condition 2: Firm Surface, EC 30 Sec, Mild Sway  Condition 3: Foam Surface, EO 30 Sec, Mild Sway  Condition 4: Foam Surface, EC 30 Sec, Moderate Sway        GOALS: Goals reviewed with patient? Yes  SHORT TERM GOALS: Target date: same as LTG    LONG TERM GOALS: Target  date: 12/09/2023    The patient will be independent with HEP for gaze adaptation, habituation, balance, and general mobility. Baseline: reports limited compliance 10/28/23 Goal status: IN PROGRESS  2.  Demo improved postural stability per normal-mild sway condition 4 M-CTSIB to improve safety with ADL Baseline: mild sway 10/28/23 Goal status: IN PROGRESS 10/28/23  3.  Reduce risk for falls per score 19/24 Dynamic Gait Index Baseline: 10/24; 13/24 10/28/23 Goal status: IN PROGRESS 10/28/23  4.  Pt to report absence of positional dizziness to improve comfort and reduce risk for falls during mobility Baseline: upbeating torsional nystagmus lasting ~15 sec in R sidelying test 10/28/23 Goal status: IN PROGRESS 10/28/23  5.  Patient to demonstrate at least 10 deg improvement in cervical AROM in all planes to improve ability to position head with CRM.  Baseline: see above  Goal status: IN PROGRESS  6.  Patient to report 50% improvement in ease of performing dental hygiene without neck restriction limiting.  Baseline: reports difficulty brushing teeth and gargling 10/28/23 Goal status: IN PROGRESS    ASSESSMENT:  CLINICAL IMPRESSION: Patient arrived to session with report of HA yesterday; pain-free today. Positional testing revealed improved R posterior canalithiasis. Treated again with R universal repositioning maneuver. During CRM, patient demonstrated L upbeating torsional nystagmus, suggesting additional L BPPV today. Plan to address this next session. Patient tolerated STM to neck well and remarked on improvement in perceived tightness upon leaving.     OBJECTIVE IMPAIRMENTS: Abnormal gait, decreased activity tolerance, decreased balance, difficulty walking, decreased ROM, decreased strength, dizziness, increased muscle spasms, improper body mechanics, and pain.   ACTIVITY LIMITATIONS: carrying, lifting, stairs, bed mobility, reach over head, and locomotion level  PARTICIPATION  LIMITATIONS: meal prep, cleaning, interpersonal relationship, shopping, and exercise  PERSONAL FACTORS: Age, Time since onset of injury/illness/exacerbation, and 3+ comorbidities: PMH  are also affecting patient's functional outcome.   REHAB POTENTIAL: Excellent  CLINICAL DECISION MAKING: Evolving/moderate complexity  EVALUATION COMPLEXITY: Moderate   PLAN:  PT FREQUENCY: 2x/week  for 3 weeks followed by 1x/week for 3 weeks.  PT DURATION: -  PLANNED INTERVENTIONS: 97110-Therapeutic exercises, 97530- Therapeutic activity, [id number]- Neuromuscular re-education, [phone]- Self Care, 62130- Manual therapy, [phone]- Gait training, [phone]- Canalith repositioning, [phone]- Aquatic Therapy, Patient/Family education, Balance training, Stair training, Joint mobilization, Spinal mobilization, Vestibular training, and DME instructions  PLAN FOR NEXT SESSION: check for L BPPV; cervical ROM exercises, STM; goal is to improve cervical restriction to allow for improved motion to clear R posterior BPPV     Baldemar Friday, PT, DPT 11/11/23 11:49 AM  [facility] at [facility] [address], [address] [place], [place] [address] Phone # [phone] Fax # [phone]

## 2023-11-11 ENCOUNTER — Ambulatory Visit: Payer: Medicare PPO | Admitting: Physical Therapy

## 2023-11-11 ENCOUNTER — Encounter: Payer: Self-pay | Admitting: Physical Therapy

## 2023-11-11 DIAGNOSIS — R42 Dizziness and giddiness: Secondary | ICD-10-CM | POA: Diagnosis not present

## 2023-11-11 DIAGNOSIS — R2681 Unsteadiness on feet: Secondary | ICD-10-CM | POA: Diagnosis not present

## 2023-11-11 DIAGNOSIS — M542 Cervicalgia: Secondary | ICD-10-CM | POA: Diagnosis not present

## 2023-11-11 DIAGNOSIS — R262 Difficulty in walking, not elsewhere classified: Secondary | ICD-10-CM

## 2023-11-11 DIAGNOSIS — R2689 Other abnormalities of gait and mobility: Secondary | ICD-10-CM | POA: Diagnosis not present

## 2023-11-16 ENCOUNTER — Ambulatory Visit: Payer: Medicare PPO | Admitting: Physical Therapy

## 2023-11-19 ENCOUNTER — Ambulatory Visit: Payer: Medicare PPO

## 2023-11-25 NOTE — Therapy (Signed)
 OUTPATIENT PHYSICAL THERAPY VESTIBULAR PROGRESS NOTE     Patient Name: Christina Mcclain MRN: [id number] DOB:14-Jun-1956, 68 y.o., female Today's Date: 11/27/2023   Progress Note Reporting Period 09/30/23 to 11/27/23  See note below for Objective Data and Assessment of Progress/Goals.     END OF SESSION:  PT End of Session - 11/27/23 1016     Visit Number 10    Number of Visits 14    Date for PT Re-Evaluation 12/09/23    Authorization Type [employer]    Authorization Time Period approved total of 14 PT visits from 09/30/2023 - 12/09/2023   new auth submitted   Authorization - Visit Number 10    Authorization - Number of Visits 14    PT Start Time [phone]    PT Stop Time 1012    PT Time Calculation (min) 38 min    Equipment Utilized During Treatment Gait belt    Activity Tolerance Patient tolerated treatment well    Behavior During Therapy WFL for tasks assessed/performed                     Past Medical History:  Diagnosis Date   Adverse effect of anesthesia    hard to awaken   Anemia    Arthritis    Asthma    Asthma without status asthmaticus    unspecified; Take advair  each night   Breast cancer (HCC) 2021   right breast IMC   Bronchitis    Chronic kidney disease    stage III per dr marcelino   Constipation    Diabetes (HCC) 2010   Diabetes mellitus type 2, uncomplicated (HCC)    Diabetes mellitus without complication (HCC)    Encounter for blood transfusion    hysterectomy 9-10 years ago   Heart murmur    unspecified   Hyperlipidemia    Hypertension 1991   Hypothyroid    Multiple thyroid  nodules    Neuromuscular disorder (HCC)    neuropathy   Osteoarthritis of right hip 02/04/2017   Palpitations    Pernicious anemia    Personal history of chemotherapy 2020-2021   Right breast ca   Personal history of radiation therapy 2021   right breast [facility]   Pneumonia    Sleep apnea    Use C-PAP; can't use mouth piece. Anxiety   Thyroid  disease     nodules   Thyroid  nodule    x2   Urinary urgency    Past Surgical History:  Procedure Laterality Date   ABDOMINAL HYSTERECTOMY  2011   partial   ABDOMINAL SURGERY     BREAST BIOPSY Right 10/02/2020   affirm bx, x marker, fat necrosis   BREAST BIOPSY Right 10/19/2019   two masses at 9:00 us  bx, coil marker and venus marker, IMC for both   BREAST LUMPECTOMY Right 03/28/2020   bracketing of two IMC masses at 9:00.  Venus marker was pushed medially during wire loc. Remains in breast.  Clear surgical margins. Negative Lymph node   CARPAL TUNNEL RELEASE Left 2008   CARPAL TUNNEL RELEASE Right    CHOLECYSTECTOMY  1982   COLONOSCOPY WITH PROPOFOL  N/A 10/05/2017   Procedure: COLONOSCOPY WITH PROPOFOL ;  Surgeon: Viktoria Lamar DASEN, MD;  Location: [facility] ENDOSCOPY;  Service: Endoscopy;  Laterality: N/A;   COLONOSCOPY WITH PROPOFOL  N/A 08/09/2021   Procedure: COLONOSCOPY WITH PROPOFOL ;  Surgeon: Onita Elspeth Sharper, DO;  Location: [facility] ENDOSCOPY;  Service: Gastroenterology;  Laterality: N/A;   DIAGNOSTIC LAPAROSCOPY  ESOPHAGOGASTRODUODENOSCOPY (EGD) WITH PROPOFOL  N/A 08/09/2021   Procedure: ESOPHAGOGASTRODUODENOSCOPY (EGD) WITH PROPOFOL ;  Surgeon: Onita Elspeth Sharper, DO;  Location: [facility] ENDOSCOPY;  Service: Gastroenterology;  Laterality: N/A;   JOINT REPLACEMENT Right 02/09/2017   TOTAL HIP REPLACEMENT, DR. CARTER. [place]    KNEE ARTHROSCOPY  1990s   PARTIAL MASTECTOMY WITH NEEDLE LOCALIZATION AND AXILLARY SENTINEL LYMPH NODE BX Right 03/28/2020   Procedure: PARTIAL MASTECTOMY WITH NEEDLE LOCALIZATION AND AXILLARY SENTINEL LYMPH NODE BX;  Surgeon: Rodolph Romano, MD;  Location: [facility] ORS;  Service: General;  Laterality: Right;   PORTACATH PLACEMENT Left 11/07/2019   Procedure: INSERTION PORT-A-CATH;  Surgeon: Rodolph Romano, MD;  Location: [facility] ORS;  Service: General;  Laterality: Left;   TOTAL HIP ARTHROPLASTY Left 06/23/2017   Procedure: TOTAL HIP ARTHROPLASTY ANTERIOR APPROACH;   Surgeon: Kathlynn Sharper, MD;  Location: [facility] ORS;  Service: Orthopedics;  Laterality: Left;   Patient Active Problem List   Diagnosis Date Noted   Hypertension associated with diabetes (HCC) 10/16/2023   Combined hyperlipidemia associated with type 2 diabetes mellitus (HCC) 10/16/2023   SOB (shortness of breath) 08/11/2023   Urinary tract infection without hematuria 04/09/2023   Mixed hyperlipidemia 04/09/2023   Rash of face 03/17/2023   Absolute anemia 03/17/2023   Palpitations 11/09/2019   Pelvic pressure in female 11/09/2019   Invasive carcinoma of breast (HCC) 10/31/2019   Goals of care, counseling/discussion 10/31/2019   Nonintractable headache 08/03/2019   Tinnitus of both ears 08/03/2019   Bilateral hand numbness 09/14/2018   Bilateral hand pain 09/14/2018   Myalgia due to statin 08/26/2018   BMI 40.0-44.9, adult (HCC) 12/16/2017   Primary localized osteoarthrosis of left hip 06/23/2017   Type 2 diabetes mellitus with stage 3b chronic kidney disease, without long-term current use of insulin (HCC) 03/17/2017   Pernicious anemia 03/17/2017   Primary osteoarthritis involving multiple joints 03/17/2017   Status post right hip replacement 02/20/2017   At high risk for falls 02/11/2017   Mild persistent asthma without complication 02/11/2017   Impaired mobility and ADLs 02/11/2017   Myoclonus 07/23/2016   DISH (diffuse idiopathic skeletal hyperostosis) 07/23/2016   Obstructive sleep apnea syndrome 07/23/2016   Thyromegaly 07/23/2016   Left thyroid  nodule 09/04/2014   Chest pain, unspecified 11/14/2012   High cholesterol 11/14/2012   Gastro-esophageal reflux disease without esophagitis 11/14/2012   Family history of early CAD 11/14/2012   Overactive bladder 04/12/2012   Hemorrhoids 01/08/2010   Essential hypertension, benign 01/08/2010   Osteoarthritis of right hip 01/08/2010    PCP: Fernand Fredy RAMAN, MD REFERRING PROVIDER: Paich, Kaitlin, PA-C  REFERRING DIAG: G62.9  (ICD-10-CM) - Polyneuropathy, unspecified R26.89 (ICD-10-CM) - Other abnormalities of gait and mobility  THERAPY DIAG:  Unsteadiness on feet  Difficulty in walking, not elsewhere classified  Other abnormalities of gait and mobility  Dizziness and giddiness  Cervicalgia  ONSET DATE: worse past 6 months  Rationale for Evaluation and Treatment: Rehabilitation  SUBJECTIVE:   SUBJECTIVE STATEMENT: Reports that she is having less dizziness when getting out of bed. Asking for massage clinic info as she misplaced info on it. Reports no change in ability to gargle d/t neck restriction.   Pt accompanied by: self  PERTINENT HISTORY: Vertigo, several minutes, sudden onset in supine position Sudden onset vertigo in the supine position 09/15/2023, resolved. Headache this morning, resolved by time of appointment. R TKR, left knee OA  PAIN:  Are you having pain? Yes: NPRS scale: no/10 Pain location: forehead/sinuses  Pain description: pressure Aggravating factors: nothing Relieving factors:  claritin    PRECAUTIONS: Fall  RED FLAGS: None   WEIGHT BEARING RESTRICTIONS: No  FALLS: Has patient fallen in last 6 months? No  LIVING ENVIRONMENT: Lives with: lives with their spouse Lives in: House/apartment Stairs: No Has following equipment at home: Single point cane  PLOF: Independent with basic ADLs and Independent with household mobility without device  PATIENT GOALS:   OBJECTIVE:    TODAY'S TREATMENT: 11/27/23   M-CTSIB  Condition 1: Firm Surface, EO 30 Sec, Normal Sway  Condition 2: Firm Surface, EC 30 Sec, Mild Sway  Condition 3: Foam Surface, EO 30 Sec, Mild Sway  Condition 4: Foam Surface, EC 30 Sec, Mild Sway    CERVICAL ROM:    Active ROM AROM (deg) 10/28/23 AROM 11/27/23  Flexion 30 35  Extension 14 *pain 15 *pain  Right lateral flexion 19 *pain 20   Left lateral flexion 20 *pain  22  Right rotation 36  40  Left rotation 39 30   (Blank rows = not  tested)  Activity Comments  DGI 15/24     [facility] PT Assessment - 11/27/23 0001       Dynamic Gait Index   Level Surface Mild Impairment    Change in Gait Speed Mild Impairment    Gait with Horizontal Head Turns Mild Impairment    Gait with Vertical Head Turns Normal    Gait and Pivot Turn Normal    Step Over Obstacle Severe Impairment    Step Around Obstacles Mild Impairment    Steps Moderate Impairment    Total Score 15    DGI comment: baseline gait still antalgic and limited in L knee flexion             HOME EXERCISE PROGRAM Last updated: 11/27/23 Access Code: [email or web address] URL: [email or web address] Date: 11/27/2023 Prepared by: [facility] Outpatient  Rehab - [facility]  Exercises - Corner Balance Feet Together With Eyes Open  - 1 x daily - 7 x weekly - 3 sets - 30 sec hold - Corner Balance Feet Together With Eyes Closed  - 1 x daily - 7 x weekly - 3 sets - 30 sec hold - Corner Balance Feet Together: Eyes Open With Head Turns  - 1 x daily - 7 x weekly - 3 sets - 3 reps - Corner Balance Feet Together: Eyes Closed With Head Turns  - 1 x daily - 7 x weekly - 3 sets - 3 reps - Seated Cervical Retraction  - 1 x daily - 7 x weekly - 2-5 sec hold - Seated Shoulder Row with Anchored Resistance  - 1 x daily - 7 x weekly - 3 sets - 10 reps - Supine to Right Sidelying Vestibular Habituation  - 1 x daily - 7 x weekly - 5-10 reps - Brandt-Daroff Vestibular Exercise  - 1 x daily - 5 x weekly - 2 sets - 3-5 reps - Seated Assisted Cervical Rotation with Towel  - 1 x daily - 5 x weekly - 2 sets - 10 reps - Cervical Extension AROM with Strap  - 1 x daily - 5 x weekly - 2 sets - 10 reps - Supine Heel Slide with Strap  - 1 x daily - 5 x weekly - 2 sets - 10 reps - 3-5 sec hold - Quad Setting and Stretching  - 1 x daily - 5 x weekly - 2 sets - 10 reps - 3 sec hold   PATIENT EDUCATION: Education details: readministered massage therapy clinics  in the area, HEP update and  demo of knee ROM exercises- advised to perform to tolerance, edu on progress towards goals and remaining impairments  Person educated: Patient Education method: Explanation, Demonstration, Tactile cues, Verbal cues, and Handouts Education comprehension: verbalized understanding and returned demonstration     10/07/23 VESTIBULAR ASSESSMENT   GENERAL OBSERVATION: wears progressive lens bifocals, holds head in left lateral tilt    SYMPTOM BEHAVIOR:   Subjective history: hx of vertigo, reports feeling of fullness in right ear over the past week   Non-Vestibular symptoms: headaches   Type of dizziness: Spinning/Vertigo   Frequency: daily   Duration: seconds/minutes   Aggravating factors: Induced by position change: rolling to the right   Relieving factors: slow movements   Progression of symptoms: unchanged   OCULOMOTOR EXAM:   Ocular Alignment: normal   Ocular ROM: No Limitations   Spontaneous Nystagmus: absent   Gaze-Induced Nystagmus: absent   Smooth Pursuits: intact   Saccades: intact   Convergence/Divergence: 8 cm      VESTIBULAR - OCULAR REFLEX:    Slow VOR: Normal   VOR Cancellation: Normal   Head-Impulse Test: HIT Right: negative HIT Left: negative   Dynamic Visual Acuity: Not able to be assessed      POSITIONAL TESTING: Right Dix-Hallpike: no nystagmus Left Dix-Hallpike: no nystagmus Right Roll Test:  no nystagmus but reports brief spinning sensation Left Roll Test: no nystagmus      Note: Objective measures were completed at Evaluation unless otherwise noted.  DIAGNOSTIC FINDINGS: IMPRESSION: No acute intracranial abnormality.  COGNITION: Overall cognitive status: Within functional limits for tasks assessed   SENSATION: Not tested Reports stocking distribution of symptoms in her feet Pain in left hand > right EDEMA:  Slight in bilat ankles  MUSCLE TONE:  NT  DTRs:  NT  POSTURE:  No Significant postural limitations  Cervical ROM:     Globally limited ROM extension > lateral flexion > rotation  STRENGTH:   LOWER EXTREMITY MMT:   BLE gross 4/5 strength  BED MOBILITY:  indep  TRANSFERS: Assistive device utilized: None  Sit to stand: Complete Independence Stand to sit: Complete Independence Chair to chair: Complete Independence Floor:  NT  RAMP: NT  CURB: Modified independence  GAIT: Gait pattern:  very limited left knee flexion during gait due to antalgia left knee OA Distance walked:  Assistive device utilized: Single point cane and None Level of assistance: Modified independence Comments:   FUNCTIONAL TESTS:  5 times sit to stand: 22.47 sec Dynamic Gait Index: 10/24  M-CTSIB  Condition 1: Firm Surface, EO 30 Sec, Normal Sway  Condition 2: Firm Surface, EC 30 Sec, Mild Sway  Condition 3: Foam Surface, EO 30 Sec, Mild Sway  Condition 4: Foam Surface, EC 30 Sec, Moderate Sway        GOALS: Goals reviewed with patient? Yes  SHORT TERM GOALS: Target date: same as LTG    LONG TERM GOALS: Target date: 12/09/2023    The patient will be independent with HEP for gaze adaptation, habituation, balance, and general mobility. Baseline: reports limited compliance 10/28/23; updated 11/27/23 Goal status: IN PROGRESS 11/27/23  2.  Demo improved postural stability per normal-mild sway condition 4 M-CTSIB to improve safety with ADL Baseline: mild sway 10/28/23; mild sway 11/27/23 Goal status: IN PROGRESS 11/27/23  3.  Reduce risk for falls per score 19/24 Dynamic Gait Index Baseline: 10/24; 13/24 10/28/23; 15/24 11/27/23 Goal status: IN PROGRESS 11/27/23  4.  Pt to report absence of  positional dizziness to improve comfort and reduce risk for falls during mobility Baseline: upbeating torsional nystagmus lasting ~15 sec in R sidelying test 10/28/23 Goal status: IN PROGRESS 10/28/23  5.  Patient to demonstrate at least 10 deg improvement in cervical AROM in all planes to improve ability to position head with CRM.   Baseline: see above; improved but not quite met 11/27/23 Goal status: IN PROGRESS 11/27/23  6.  Patient to report 50% improvement in ease of performing dental hygiene without neck restriction limiting.  Baseline: reports difficulty brushing teeth and gargling 10/28/23; Reports no change in ability to gargle d/t neck restriction 11/27/23 Goal status: IN PROGRESS 11/27/23    ASSESSMENT:  CLINICAL IMPRESSION: Patient arrived to session with report of reduced dizziness but reports no change in ability to gargle d/t neck restriction. Cervical AROM has improved in all planes slightly and she reports less pain. Patient scored 15/24 on DGI, indicating an increased risk of falls but improved from initial assessment. Multisensory balance testing reveals mild sway. At this time, it appears that patient's L knee is most limiting factor as far as balance and gait is concerned. Provided a few gentle ROM exercises to maintain mobility. Dizziness goal not assessed d/t time. Patient is progressing slowly towards goals.    OBJECTIVE IMPAIRMENTS: Abnormal gait, decreased activity tolerance, decreased balance, difficulty walking, decreased ROM, decreased strength, dizziness, increased muscle spasms, improper body mechanics, and pain.   ACTIVITY LIMITATIONS: carrying, lifting, stairs, bed mobility, reach over head, and locomotion level  PARTICIPATION LIMITATIONS: meal prep, cleaning, interpersonal relationship, shopping, and exercise  PERSONAL FACTORS: Age, Time since onset of injury/illness/exacerbation, and 3+ comorbidities: PMH  are also affecting patient's functional outcome.   REHAB POTENTIAL: Excellent  CLINICAL DECISION MAKING: Evolving/moderate complexity  EVALUATION COMPLEXITY: Moderate   PLAN:  PT FREQUENCY: 2x/week for 3 weeks followed by 1x/week for 3 weeks.  PT DURATION: -  PLANNED INTERVENTIONS: 97110-Therapeutic exercises, 97530- Therapeutic activity, [id number]- Neuromuscular re-education, [phone]-  Self Care, [id number]- Manual therapy, [phone]- Gait training, [phone]- Canalith repositioning, [phone]- Aquatic Therapy, Patient/Family education, Balance training, Stair training, Joint mobilization, Spinal mobilization, Vestibular training, and DME instructions  PLAN FOR NEXT SESSION: check for L BPPV; cervical ROM exercises, STM; goal is to improve cervical restriction to allow for improved motion to clear R posterior BPPV     Louana Terrilyn Christians, PT, DPT 11/27/23 10:18 AM  [facility] at [facility] [address] [place], [place] [address] Phone # [phone] Fax # [phone]

## 2023-11-26 ENCOUNTER — Encounter: Payer: Self-pay | Admitting: Oncology

## 2023-11-27 ENCOUNTER — Encounter: Payer: Self-pay | Admitting: Physical Therapy

## 2023-11-27 ENCOUNTER — Ambulatory Visit: Payer: Medicare PPO | Attending: Student | Admitting: Physical Therapy

## 2023-11-27 DIAGNOSIS — M542 Cervicalgia: Secondary | ICD-10-CM | POA: Insufficient documentation

## 2023-11-27 DIAGNOSIS — R42 Dizziness and giddiness: Secondary | ICD-10-CM | POA: Insufficient documentation

## 2023-11-27 DIAGNOSIS — R2681 Unsteadiness on feet: Secondary | ICD-10-CM | POA: Diagnosis not present

## 2023-11-27 DIAGNOSIS — R2689 Other abnormalities of gait and mobility: Secondary | ICD-10-CM | POA: Insufficient documentation

## 2023-11-27 DIAGNOSIS — R262 Difficulty in walking, not elsewhere classified: Secondary | ICD-10-CM | POA: Diagnosis not present

## 2023-11-27 NOTE — Patient Instructions (Signed)
 Massage clinics in the area:  [employer] [address] [place], [place], [place] [address] [phone]   [facility] [employer] [facility]) [address] [place], [place] [address] [phone]

## 2023-12-01 NOTE — Therapy (Signed)
 OUTPATIENT PHYSICAL THERAPY VESTIBULAR NOTE     Patient Name: Christina Mcclain MRN: [id number] DOB:Feb 07, 1956, 68 y.o., female Today's Date: 12/02/2023     END OF SESSION:  PT End of Session - 12/02/23 1530     Visit Number 11    Number of Visits 14    Date for PT Re-Evaluation 12/09/23    Authorization Type [employer]    Authorization Time Period approved total of 14 PT visits from 09/30/2023 - 12/09/2023   new auth submitted   Authorization - Visit Number 11    Authorization - Number of Visits 14    PT Start Time 1443    PT Stop Time 1530    PT Time Calculation (min) 47 min    Equipment Utilized During Treatment Gait belt    Activity Tolerance Patient tolerated treatment well    Behavior During Therapy WFL for tasks assessed/performed                      Past Medical History:  Diagnosis Date   Adverse effect of anesthesia    hard to awaken   Anemia    Arthritis    Asthma    Asthma without status asthmaticus    unspecified; Take advair  each night   Breast cancer (HCC) 2021   right breast IMC   Bronchitis    Chronic kidney disease    stage III per dr marcelino   Constipation    Diabetes (HCC) 2010   Diabetes mellitus type 2, uncomplicated (HCC)    Diabetes mellitus without complication (HCC)    Encounter for blood transfusion    hysterectomy 9-10 years ago   Heart murmur    unspecified   Hyperlipidemia    Hypertension 1991   Hypothyroid    Multiple thyroid  nodules    Neuromuscular disorder (HCC)    neuropathy   Osteoarthritis of right hip 02/04/2017   Palpitations    Pernicious anemia    Personal history of chemotherapy 2020-2021   Right breast ca   Personal history of radiation therapy 2021   right breast [facility]   Pneumonia    Sleep apnea    Use C-PAP; can't use mouth piece. Anxiety   Thyroid  disease    nodules   Thyroid  nodule    x2   Urinary urgency    Past Surgical History:  Procedure Laterality Date   ABDOMINAL  HYSTERECTOMY  2011   partial   ABDOMINAL SURGERY     BREAST BIOPSY Right 10/02/2020   affirm bx, x marker, fat necrosis   BREAST BIOPSY Right 10/19/2019   two masses at 9:00 us  bx, coil marker and venus marker, IMC for both   BREAST LUMPECTOMY Right 03/28/2020   bracketing of two IMC masses at 9:00.  Venus marker was pushed medially during wire loc. Remains in breast.  Clear surgical margins. Negative Lymph node   CARPAL TUNNEL RELEASE Left 2008   CARPAL TUNNEL RELEASE Right    CHOLECYSTECTOMY  1982   COLONOSCOPY WITH PROPOFOL  N/A 10/05/2017   Procedure: COLONOSCOPY WITH PROPOFOL ;  Surgeon: Viktoria Lamar DASEN, MD;  Location: [facility] ENDOSCOPY;  Service: Endoscopy;  Laterality: N/A;   COLONOSCOPY WITH PROPOFOL  N/A 08/09/2021   Procedure: COLONOSCOPY WITH PROPOFOL ;  Surgeon: Onita Elspeth Sharper, DO;  Location: [facility] ENDOSCOPY;  Service: Gastroenterology;  Laterality: N/A;   DIAGNOSTIC LAPAROSCOPY     ESOPHAGOGASTRODUODENOSCOPY (EGD) WITH PROPOFOL  N/A 08/09/2021   Procedure: ESOPHAGOGASTRODUODENOSCOPY (EGD) WITH PROPOFOL ;  Surgeon: Onita Elspeth Sharper, DO;  Location: [facility] ENDOSCOPY;  Service: Gastroenterology;  Laterality: N/A;   JOINT REPLACEMENT Right 02/09/2017   TOTAL HIP REPLACEMENT, DR. CARTER. [place]    KNEE ARTHROSCOPY  1990s   PARTIAL MASTECTOMY WITH NEEDLE LOCALIZATION AND AXILLARY SENTINEL LYMPH NODE BX Right 03/28/2020   Procedure: PARTIAL MASTECTOMY WITH NEEDLE LOCALIZATION AND AXILLARY SENTINEL LYMPH NODE BX;  Surgeon: Rodolph Romano, MD;  Location: [facility] ORS;  Service: General;  Laterality: Right;   PORTACATH PLACEMENT Left 11/07/2019   Procedure: INSERTION PORT-A-CATH;  Surgeon: Rodolph Romano, MD;  Location: [facility] ORS;  Service: General;  Laterality: Left;   TOTAL HIP ARTHROPLASTY Left 06/23/2017   Procedure: TOTAL HIP ARTHROPLASTY ANTERIOR APPROACH;  Surgeon: Kathlynn Sharper, MD;  Location: [facility] ORS;  Service: Orthopedics;  Laterality: Left;   Patient Active Problem  List   Diagnosis Date Noted   Hypertension associated with diabetes (HCC) 10/16/2023   Combined hyperlipidemia associated with type 2 diabetes mellitus (HCC) 10/16/2023   SOB (shortness of breath) 08/11/2023   Urinary tract infection without hematuria 04/09/2023   Mixed hyperlipidemia 04/09/2023   Rash of face 03/17/2023   Absolute anemia 03/17/2023   Palpitations 11/09/2019   Pelvic pressure in female 11/09/2019   Invasive carcinoma of breast (HCC) 10/31/2019   Goals of care, counseling/discussion 10/31/2019   Nonintractable headache 08/03/2019   Tinnitus of both ears 08/03/2019   Bilateral hand numbness 09/14/2018   Bilateral hand pain 09/14/2018   Myalgia due to statin 08/26/2018   BMI 40.0-44.9, adult (HCC) 12/16/2017   Primary localized osteoarthrosis of left hip 06/23/2017   Type 2 diabetes mellitus with stage 3b chronic kidney disease, without long-term current use of insulin (HCC) 03/17/2017   Pernicious anemia 03/17/2017   Primary osteoarthritis involving multiple joints 03/17/2017   Status post right hip replacement 02/20/2017   At high risk for falls 02/11/2017   Mild persistent asthma without complication 02/11/2017   Impaired mobility and ADLs 02/11/2017   Myoclonus 07/23/2016   DISH (diffuse idiopathic skeletal hyperostosis) 07/23/2016   Obstructive sleep apnea syndrome 07/23/2016   Thyromegaly 07/23/2016   Left thyroid  nodule 09/04/2014   Chest pain, unspecified 11/14/2012   High cholesterol 11/14/2012   Gastro-esophageal reflux disease without esophagitis 11/14/2012   Family history of early CAD 11/14/2012   Overactive bladder 04/12/2012   Hemorrhoids 01/08/2010   Essential hypertension, benign 01/08/2010   Osteoarthritis of right hip 01/08/2010    PCP: Fernand Fredy RAMAN, MD REFERRING PROVIDER: Paich, Kaitlin, PA-C  REFERRING DIAG: G62.9 (ICD-10-CM) - Polyneuropathy, unspecified R26.89 (ICD-10-CM) - Other abnormalities of gait and mobility  THERAPY DIAG:   Unsteadiness on feet  Difficulty in walking, not elsewhere classified  Other abnormalities of gait and mobility  Dizziness and giddiness  Cervicalgia  ONSET DATE: worse past 6 months  Rationale for Evaluation and Treatment: Rehabilitation  SUBJECTIVE:   SUBJECTIVE STATEMENT: Been doing the exercises- the knee exercises are helping.   Pt accompanied by: self  PERTINENT HISTORY: Vertigo, several minutes, sudden onset in supine position Sudden onset vertigo in the supine position 09/15/2023, resolved. Headache this morning, resolved by time of appointment. R TKR, left knee OA  PAIN:  Are you having pain? Yes: NPRS scale: no/10 Pain location: forehead/sinuses  Pain description: pressure Aggravating factors: nothing Relieving factors: claritin    PRECAUTIONS: Fall  RED FLAGS: None   WEIGHT BEARING RESTRICTIONS: No  FALLS: Has patient fallen in last 6 months? No  LIVING ENVIRONMENT: Lives with: lives with their spouse Lives in: House/apartment Stairs: No Has following equipment at home:  Single point cane  PLOF: Independent with basic ADLs and Independent with household mobility without device  PATIENT GOALS:   OBJECTIVE:       TODAY'S TREATMENT: 12/02/23 Activity Comments  R sidelying  R upbeating torsional nystagmus lasting ~7 sec   R Li maneuver  Performed at slow velocity first for max understanding, 2nd trial at quick speed. Reported mild wooziness upon sitting up  R sidelying  Negative; mild wooziness upon siting   R sidelying  Negative; no wooziness upon sitting up   1/2 turns in doorway  Quick turns without dizziness; mild unsteadiness   Bending to pick up cones from floor  Performed without dizziness   Sitting anterior canal habituation 3x10 each side No dizziness   R sidelying test  Return of R upbeating torsional nystagmus lasting ~10 sec   R Li maneuver  Tolerated well; reports mild dizziness upon sitting up        PATIENT  EDUCATION: Education details: provided detailed instruction and handout on use of brandt daroff and Li maneuver for self-assessment and treatment of R BPPV, to be performed with husband's assist ; advised to try to keep head still for the rest of the day d/t recurrence  Person educated: Patient Education method: Explanation, Demonstration, Tactile cues, Verbal cues, and Handouts Education comprehension: verbalized understanding and returned demonstration    HOME EXERCISE PROGRAM Last updated: 11/27/23 Access Code: [email or web address] URL: [email or web address] Date: 11/27/2023 Prepared by: [facility] - Outpatient  Rehab - [facility]  Exercises - Corner Balance Feet Together With Eyes Open  - 1 x daily - 7 x weekly - 3 sets - 30 sec hold - Corner Balance Feet Together With Eyes Closed  - 1 x daily - 7 x weekly - 3 sets - 30 sec hold - Corner Balance Feet Together: Eyes Open With Head Turns  - 1 x daily - 7 x weekly - 3 sets - 3 reps - Corner Balance Feet Together: Eyes Closed With Head Turns  - 1 x daily - 7 x weekly - 3 sets - 3 reps - Seated Cervical Retraction  - 1 x daily - 7 x weekly - 2-5 sec hold - Seated Shoulder Row with Anchored Resistance  - 1 x daily - 7 x weekly - 3 sets - 10 reps - Supine to Right Sidelying Vestibular Habituation  - 1 x daily - 7 x weekly - 5-10 reps - Brandt-Daroff Vestibular Exercise  - 1 x daily - 5 x weekly - 2 sets - 3-5 reps - Seated Assisted Cervical Rotation with Towel  - 1 x daily - 5 x weekly - 2 sets - 10 reps - Cervical Extension AROM with Strap  - 1 x daily - 5 x weekly - 2 sets - 10 reps - Supine Heel Slide with Strap  - 1 x daily - 5 x weekly - 2 sets - 10 reps - 3-5 sec hold - Quad Setting and Stretching  - 1 x daily - 5 x weekly - 2 sets - 10 reps - 3 sec hold     10/07/23 VESTIBULAR ASSESSMENT   GENERAL OBSERVATION: wears progressive lens bifocals, holds head in left lateral tilt    SYMPTOM BEHAVIOR:   Subjective history: hx  of vertigo, reports feeling of fullness in right ear over the past week   Non-Vestibular symptoms: headaches   Type of dizziness: Spinning/Vertigo   Frequency: daily   Duration: seconds/minutes   Aggravating factors: Induced by position change: rolling to  the right   Relieving factors: slow movements   Progression of symptoms: unchanged   OCULOMOTOR EXAM:   Ocular Alignment: normal   Ocular ROM: No Limitations   Spontaneous Nystagmus: absent   Gaze-Induced Nystagmus: absent   Smooth Pursuits: intact   Saccades: intact   Convergence/Divergence: 8 cm      VESTIBULAR - OCULAR REFLEX:    Slow VOR: Normal   VOR Cancellation: Normal   Head-Impulse Test: HIT Right: negative HIT Left: negative   Dynamic Visual Acuity: Not able to be assessed      POSITIONAL TESTING: Right Dix-Hallpike: no nystagmus Left Dix-Hallpike: no nystagmus Right Roll Test:  no nystagmus but reports brief spinning sensation Left Roll Test: no nystagmus      Note: Objective measures were completed at Evaluation unless otherwise noted.  DIAGNOSTIC FINDINGS: IMPRESSION: No acute intracranial abnormality.  COGNITION: Overall cognitive status: Within functional limits for tasks assessed   SENSATION: Not tested Reports stocking distribution of symptoms in her feet Pain in left hand > right EDEMA:  Slight in bilat ankles  MUSCLE TONE:  NT  DTRs:  NT  POSTURE:  No Significant postural limitations  Cervical ROM:    Globally limited ROM extension > lateral flexion > rotation  STRENGTH:   LOWER EXTREMITY MMT:   BLE gross 4/5 strength  BED MOBILITY:  indep  TRANSFERS: Assistive device utilized: None  Sit to stand: Complete Independence Stand to sit: Complete Independence Chair to chair: Complete Independence Floor:  NT  RAMP: NT  CURB: Modified independence  GAIT: Gait pattern:  very limited left knee flexion during gait due to antalgia left knee OA Distance walked:  Assistive  device utilized: Single point cane and None Level of assistance: Modified independence Comments:   FUNCTIONAL TESTS:  5 times sit to stand: 22.47 sec Dynamic Gait Index: 10/24  M-CTSIB  Condition 1: Firm Surface, EO 30 Sec, Normal Sway  Condition 2: Firm Surface, EC 30 Sec, Mild Sway  Condition 3: Foam Surface, EO 30 Sec, Mild Sway  Condition 4: Foam Surface, EC 30 Sec, Moderate Sway        GOALS: Goals reviewed with patient? Yes  SHORT TERM GOALS: Target date: same as LTG    LONG TERM GOALS: Target date: 12/09/2023    The patient will be independent with HEP for gaze adaptation, habituation, balance, and general mobility. Baseline: reports limited compliance 10/28/23; updated 11/27/23 Goal status: IN PROGRESS 11/27/23  2.  Demo improved postural stability per normal-mild sway condition 4 M-CTSIB to improve safety with ADL Baseline: mild sway 10/28/23; mild sway 11/27/23 Goal status: IN PROGRESS 11/27/23  3.  Reduce risk for falls per score 19/24 Dynamic Gait Index Baseline: 10/24; 13/24 10/28/23; 15/24 11/27/23 Goal status: IN PROGRESS 11/27/23  4.  Pt to report absence of positional dizziness to improve comfort and reduce risk for falls during mobility Baseline: upbeating torsional nystagmus lasting ~15 sec in R sidelying test 10/28/23 Goal status: IN PROGRESS 10/28/23  5.  Patient to demonstrate at least 10 deg improvement in cervical AROM in all planes to improve ability to position head with CRM.  Baseline: see above; improved but not quite met 11/27/23 Goal status: IN PROGRESS 11/27/23  6.  Patient to report 50% improvement in ease of performing dental hygiene without neck restriction limiting.  Baseline: reports difficulty brushing teeth and gargling 10/28/23; Reports no change in ability to gargle d/t neck restriction 11/27/23 Goal status: IN PROGRESS 11/27/23    ASSESSMENT:  CLINICAL IMPRESSION:  Patient arrived to session without new complaints. Positional testing reveals  return of R posterior canalithiasis. Treated with R Li maneuver x 1 with resolution of symptoms. Performed habituation tasks involving quick turns and bending which she reports occasionally cause dizziness at home. Patient reported no symptoms after CRM. As patient continues to have recurrence of R BPPV, provided detailed instruction of self-management with husband's assist. She reported understanding of this. Prior to end of session, retested with R sidelying test, which was again positive. Patient tolerated R Li maneuver again. Escorted patient to lobby safely.    OBJECTIVE IMPAIRMENTS: Abnormal gait, decreased activity tolerance, decreased balance, difficulty walking, decreased ROM, decreased strength, dizziness, increased muscle spasms, improper body mechanics, and pain.   ACTIVITY LIMITATIONS: carrying, lifting, stairs, bed mobility, reach over head, and locomotion level  PARTICIPATION LIMITATIONS: meal prep, cleaning, interpersonal relationship, shopping, and exercise  PERSONAL FACTORS: Age, Time since onset of injury/illness/exacerbation, and 3+ comorbidities: PMH  are also affecting patient's functional outcome.   REHAB POTENTIAL: Excellent  CLINICAL DECISION MAKING: Evolving/moderate complexity  EVALUATION COMPLEXITY: Moderate   PLAN:  PT FREQUENCY: 2x/week for 3 weeks followed by 1x/week for 3 weeks.  PT DURATION: -  PLANNED INTERVENTIONS: 97110-Therapeutic exercises, 97530- Therapeutic activity, [id number]- Neuromuscular re-education, [phone]- Self Care, [id number]- Manual therapy, [phone]- Gait training, [phone]- Canalith repositioning, [phone]- Aquatic Therapy, Patient/Family education, Balance training, Stair training, Joint mobilization, Spinal mobilization, Vestibular training, and DME instructions  PLAN FOR NEXT SESSION: discuss how Li maneuver at home went. check for L BPPV; cervical ROM exercises, STM; goal is to improve cervical restriction to allow for improved motion to clear R  posterior BPPV     Louana Terrilyn Christians, PT, DPT 12/02/23 3:35 PM  [facility] at [facility] [address], [address] [place], [place] [address] Phone # [phone] Fax # [phone]

## 2023-12-02 ENCOUNTER — Ambulatory Visit: Payer: Medicare PPO | Admitting: Physical Therapy

## 2023-12-02 ENCOUNTER — Encounter: Payer: Self-pay | Admitting: Physical Therapy

## 2023-12-02 DIAGNOSIS — R42 Dizziness and giddiness: Secondary | ICD-10-CM

## 2023-12-02 DIAGNOSIS — R262 Difficulty in walking, not elsewhere classified: Secondary | ICD-10-CM | POA: Diagnosis not present

## 2023-12-02 DIAGNOSIS — M542 Cervicalgia: Secondary | ICD-10-CM

## 2023-12-02 DIAGNOSIS — R2689 Other abnormalities of gait and mobility: Secondary | ICD-10-CM

## 2023-12-02 DIAGNOSIS — R2681 Unsteadiness on feet: Secondary | ICD-10-CM

## 2023-12-02 NOTE — Patient Instructions (Signed)
 Self-management of R posterior BPPV:   Perform brandt daroff and see if it makes you dizzy. If it does, perform the Li maneuver (corrective maneuver) with the help of your husband. Re-check with the brandt maneuver. If the corrective maneuver worked, you should no longer be dizzy with the brandt daroff.    Wilhelmena Daroff: perform at normal pace. Nose points up to the ceiling.      Li Maneuver: (perform quickly. Nose points straight ahead throughout)

## 2023-12-03 DIAGNOSIS — I10 Essential (primary) hypertension: Secondary | ICD-10-CM | POA: Diagnosis not present

## 2023-12-03 DIAGNOSIS — I5032 Chronic diastolic (congestive) heart failure: Secondary | ICD-10-CM | POA: Diagnosis not present

## 2023-12-03 NOTE — Therapy (Signed)
 OUTPATIENT PHYSICAL THERAPY VESTIBULAR NOTE     Patient Name: Christina Mcclain MRN: [id number] DOB:11-Mar-1956, 68 y.o., female Today's Date: 12/07/2023     END OF SESSION:  PT End of Session - 12/07/23 1010     Visit Number 12    Number of Visits 14    Date for PT Re-Evaluation 12/09/23    Authorization Type [employer]    Authorization Time Period approved total of 14 PT visits from 09/30/2023 - 12/09/2023   new auth submitted   Authorization - Visit Number 12    Authorization - Number of Visits 14    PT Start Time 0935    PT Stop Time 1010    PT Time Calculation (min) 35 min    Equipment Utilized During Treatment Gait belt    Activity Tolerance Patient tolerated treatment well    Behavior During Therapy WFL for tasks assessed/performed                      Past Medical History:  Diagnosis Date   Adverse effect of anesthesia    hard to awaken   Anemia    Arthritis    Asthma    Asthma without status asthmaticus    unspecified; Take advair  each night   Breast cancer (HCC) 2021   right breast IMC   Bronchitis    Chronic kidney disease    stage III per dr marcelino   Constipation    Diabetes (HCC) 2010   Diabetes mellitus type 2, uncomplicated (HCC)    Diabetes mellitus without complication (HCC)    Encounter for blood transfusion    hysterectomy 9-10 years ago   Heart murmur    unspecified   Hyperlipidemia    Hypertension 1991   Hypothyroid    Multiple thyroid  nodules    Neuromuscular disorder (HCC)    neuropathy   Osteoarthritis of right hip 02/04/2017   Palpitations    Pernicious anemia    Personal history of chemotherapy 2020-2021   Right breast ca   Personal history of radiation therapy 2021   right breast [facility]   Pneumonia    Sleep apnea    Use C-PAP; can't use mouth piece. Anxiety   Thyroid  disease    nodules   Thyroid  nodule    x2   Urinary urgency    Past Surgical History:  Procedure Laterality Date   ABDOMINAL  HYSTERECTOMY  2011   partial   ABDOMINAL SURGERY     BREAST BIOPSY Right 10/02/2020   affirm bx, x marker, fat necrosis   BREAST BIOPSY Right 10/19/2019   two masses at 9:00 us  bx, coil marker and venus marker, IMC for both   BREAST LUMPECTOMY Right 03/28/2020   bracketing of two IMC masses at 9:00.  Venus marker was pushed medially during wire loc. Remains in breast.  Clear surgical margins. Negative Lymph node   CARPAL TUNNEL RELEASE Left 2008   CARPAL TUNNEL RELEASE Right    CHOLECYSTECTOMY  1982   COLONOSCOPY WITH PROPOFOL  N/A 10/05/2017   Procedure: COLONOSCOPY WITH PROPOFOL ;  Surgeon: Viktoria Lamar DASEN, MD;  Location: [facility] ENDOSCOPY;  Service: Endoscopy;  Laterality: N/A;   COLONOSCOPY WITH PROPOFOL  N/A 08/09/2021   Procedure: COLONOSCOPY WITH PROPOFOL ;  Surgeon: Onita Elspeth Sharper, DO;  Location: [facility]OSCOPY;  Service: Gastroenterology;  Laterality: N/A;   DIAGNOSTIC LAPAROSCOPY     ESOPHAGOGASTRODUODENOSCOPY (EGD) WITH PROPOFOL  N/A 08/09/2021   Procedure: ESOPHAGOGASTRODUODENOSCOPY (EGD) WITH PROPOFOL ;  Surgeon: Onita Elspeth Sharper, DO;  Location: [facility] ENDOSCOPY;  Service: Gastroenterology;  Laterality: N/A;   JOINT REPLACEMENT Right 02/09/2017   TOTAL HIP REPLACEMENT, DR. CARTER. [place]    KNEE ARTHROSCOPY  1990s   PARTIAL MASTECTOMY WITH NEEDLE LOCALIZATION AND AXILLARY SENTINEL LYMPH NODE BX Right 03/28/2020   Procedure: PARTIAL MASTECTOMY WITH NEEDLE LOCALIZATION AND AXILLARY SENTINEL LYMPH NODE BX;  Surgeon: Rodolph Romano, MD;  Location: [facility] ORS;  Service: General;  Laterality: Right;   PORTACATH PLACEMENT Left 11/07/2019   Procedure: INSERTION PORT-A-CATH;  Surgeon: Rodolph Romano, MD;  Location: [facility] ORS;  Service: General;  Laterality: Left;   TOTAL HIP ARTHROPLASTY Left 06/23/2017   Procedure: TOTAL HIP ARTHROPLASTY ANTERIOR APPROACH;  Surgeon: Kathlynn Sharper, MD;  Location: [facility] ORS;  Service: Orthopedics;  Laterality: Left;   Patient Active Problem  List   Diagnosis Date Noted   Hypertension associated with diabetes (HCC) 10/16/2023   Combined hyperlipidemia associated with type 2 diabetes mellitus (HCC) 10/16/2023   SOB (shortness of breath) 08/11/2023   Urinary tract infection without hematuria 04/09/2023   Mixed hyperlipidemia 04/09/2023   Rash of face 03/17/2023   Absolute anemia 03/17/2023   Palpitations 11/09/2019   Pelvic pressure in female 11/09/2019   Invasive carcinoma of breast (HCC) 10/31/2019   Goals of care, counseling/discussion 10/31/2019   Nonintractable headache 08/03/2019   Tinnitus of both ears 08/03/2019   Bilateral hand numbness 09/14/2018   Bilateral hand pain 09/14/2018   Myalgia due to statin 08/26/2018   BMI 40.0-44.9, adult (HCC) 12/16/2017   Primary localized osteoarthrosis of left hip 06/23/2017   Type 2 diabetes mellitus with stage 3b chronic kidney disease, without long-term current use of insulin (HCC) 03/17/2017   Pernicious anemia 03/17/2017   Primary osteoarthritis involving multiple joints 03/17/2017   Status post right hip replacement 02/20/2017   At high risk for falls 02/11/2017   Mild persistent asthma without complication 02/11/2017   Impaired mobility and ADLs 02/11/2017   Myoclonus 07/23/2016   DISH (diffuse idiopathic skeletal hyperostosis) 07/23/2016   Obstructive sleep apnea syndrome 07/23/2016   Thyromegaly 07/23/2016   Left thyroid  nodule 09/04/2014   Chest pain, unspecified 11/14/2012   High cholesterol 11/14/2012   Gastro-esophageal reflux disease without esophagitis 11/14/2012   Family history of early CAD 11/14/2012   Overactive bladder 04/12/2012   Hemorrhoids 01/08/2010   Essential hypertension, benign 01/08/2010   Osteoarthritis of right hip 01/08/2010    PCP: Fernand Fredy RAMAN, MD REFERRING PROVIDER: Paich, Kaitlin, PA-C  REFERRING DIAG: G62.9 (ICD-10-CM) - Polyneuropathy, unspecified R26.89 (ICD-10-CM) - Other abnormalities of gait and mobility  THERAPY DIAG:   Unsteadiness on feet  Difficulty in walking, not elsewhere classified  Other abnormalities of gait and mobility  Dizziness and giddiness  Cervicalgia  ONSET DATE: worse past 6 months  Rationale for Evaluation and Treatment: Rehabilitation  SUBJECTIVE:   SUBJECTIVE STATEMENT: Reports about the same. Reports improvement in symptoms after performing the Li maneuver a couple times. Reports that there are no activities that she is avoiding at home.   Pt accompanied by: self  PERTINENT HISTORY: Vertigo, several minutes, sudden onset in supine position Sudden onset vertigo in the supine position 09/15/2023, resolved. Headache this morning, resolved by time of appointment. R TKR, left knee OA  PAIN:  Are you having pain? Yes: NPRS scale: no/10 Pain location: forehead/sinuses  Pain description: pressure Aggravating factors: nothing Relieving factors: claritin    PRECAUTIONS: Fall  RED FLAGS: None   WEIGHT BEARING RESTRICTIONS: No  FALLS: Has patient fallen in last 6 months? No  LIVING ENVIRONMENT: Lives with: lives with their spouse Lives in: House/apartment Stairs: No Has following equipment at home: Single point cane  PLOF: Independent with basic ADLs and Independent with household mobility without device  PATIENT GOALS:   OBJECTIVE:       TODAY'S TREATMENT: 12/07/23 Activity Comments  R sidelying test  R upbeating torsional lasting ~15 sec  R Li maneuver  Tolerated well; mild dizziness upon sitting up  R sidelying test with frenzel goggles  negative  Romberg 30 Romberg EC 30 Romberg + head turns 30 Romberg + EC head turns 30 Reports mild dizziness upon standing up  Good stability; cues not to stop at midline    R/L rolling Asymptomatic, no nystagmus   Review and provided notes of cervical rotation SNAG Good carryover     HOME EXERCISE PROGRAM Last updated: 12/07/23 Access Code: [email or web address] URL: [email or web address] Date:  12/07/2023 Prepared by: [facility] - Inpatient - Outpatient  Rehab - [facility]  Exercises - Brandt-Daroff Vestibular Exercise  - 1 x daily - 5 x weekly - Seated Cervical Retraction  - 1 x daily - 7 x weekly - 2-5 sec hold - Seated Shoulder Row with Anchored Resistance  - 1 x daily - 7 x weekly - 3 sets - 10 reps - Seated Assisted Cervical Rotation with Towel  - 1 x daily - 5 x weekly - 2 sets - 10 reps - Cervical Extension AROM with Strap  - 1 x daily - 5 x weekly - 2 sets - 10 reps - Supine Heel Slide with Strap  - 1 x daily - 5 x weekly - 2 sets - 10 reps - 3-5 sec hold - Quad Setting and Stretching  - 1 x daily - 5 x weekly - 2 sets - 10 reps - 3 sec hold   PATIENT EDUCATION: Education details: re-iterated instruction to perform Li maneuver at home as needed for positional dizziness, DC next session, HEP consolidation, benefits of L knee prehab before surgery  Person educated: Patient Education method: Explanation, Demonstration, Tactile cues, Verbal cues, and Handouts Education comprehension: verbalized understanding and returned demonstration    10/07/23 VESTIBULAR ASSESSMENT   GENERAL OBSERVATION: wears progressive lens bifocals, holds head in left lateral tilt    SYMPTOM BEHAVIOR:   Subjective history: hx of vertigo, reports feeling of fullness in right ear over the past week   Non-Vestibular symptoms: headaches   Type of dizziness: Spinning/Vertigo   Frequency: daily   Duration: seconds/minutes   Aggravating factors: Induced by position change: rolling to the right   Relieving factors: slow movements   Progression of symptoms: unchanged   OCULOMOTOR EXAM:   Ocular Alignment: normal   Ocular ROM: No Limitations   Spontaneous Nystagmus: absent   Gaze-Induced Nystagmus: absent   Smooth Pursuits: intact   Saccades: intact   Convergence/Divergence: 8 cm      VESTIBULAR - OCULAR REFLEX:    Slow VOR: Normal   VOR Cancellation: Normal   Head-Impulse Test: HIT Right:  negative HIT Left: negative   Dynamic Visual Acuity: Not able to be assessed      POSITIONAL TESTING: Right Dix-Hallpike: no nystagmus Left Dix-Hallpike: no nystagmus Right Roll Test:  no nystagmus but reports brief spinning sensation Left Roll Test: no nystagmus      Note: Objective measures were completed at Evaluation unless otherwise noted.  DIAGNOSTIC FINDINGS: IMPRESSION: No acute intracranial abnormality.  COGNITION: Overall cognitive status: Within functional limits for tasks assessed   SENSATION: Not tested Reports stocking  distribution of symptoms in her feet Pain in left hand > right EDEMA:  Slight in bilat ankles  MUSCLE TONE:  NT  DTRs:  NT  POSTURE:  No Significant postural limitations  Cervical ROM:    Globally limited ROM extension > lateral flexion > rotation  STRENGTH:   LOWER EXTREMITY MMT:   BLE gross 4/5 strength  BED MOBILITY:  indep  TRANSFERS: Assistive device utilized: None  Sit to stand: Complete Independence Stand to sit: Complete Independence Chair to chair: Complete Independence Floor:  NT  RAMP: NT  CURB: Modified independence  GAIT: Gait pattern:  very limited left knee flexion during gait due to antalgia left knee OA Distance walked:  Assistive device utilized: Single point cane and None Level of assistance: Modified independence Comments:   FUNCTIONAL TESTS:  5 times sit to stand: 22.47 sec Dynamic Gait Index: 10/24  M-CTSIB  Condition 1: Firm Surface, EO 30 Sec, Normal Sway  Condition 2: Firm Surface, EC 30 Sec, Mild Sway  Condition 3: Foam Surface, EO 30 Sec, Mild Sway  Condition 4: Foam Surface, EC 30 Sec, Moderate Sway        GOALS: Goals reviewed with patient? Yes  SHORT TERM GOALS: Target date: same as LTG    LONG TERM GOALS: Target date: 12/09/2023    The patient will be independent with HEP for gaze adaptation, habituation, balance, and general mobility. Baseline: reports limited  compliance 10/28/23; updated 11/27/23 Goal status: IN PROGRESS 11/27/23  2.  Demo improved postural stability per normal-mild sway condition 4 M-CTSIB to improve safety with ADL Baseline: mild sway 10/28/23; mild sway 11/27/23 Goal status: IN PROGRESS 11/27/23  3.  Reduce risk for falls per score 19/24 Dynamic Gait Index Baseline: 10/24; 13/24 10/28/23; 15/24 11/27/23 Goal status: IN PROGRESS 11/27/23  4.  Pt to report absence of positional dizziness to improve comfort and reduce risk for falls during mobility Baseline: upbeating torsional nystagmus lasting ~15 sec in R sidelying test 10/28/23 Goal status: IN PROGRESS 10/28/23  5.  Patient to demonstrate at least 10 deg improvement in cervical AROM in all planes to improve ability to position head with CRM.  Baseline: see above; improved but not quite met 11/27/23 Goal status: IN PROGRESS 11/27/23  6.  Patient to report 50% improvement in ease of performing dental hygiene without neck restriction limiting.  Baseline: reports difficulty brushing teeth and gargling 10/28/23; Reports no change in ability to gargle d/t neck restriction 11/27/23 Goal status: IN PROGRESS 11/27/23    ASSESSMENT:  CLINICAL IMPRESSION: Patient arrived to session without new complaints. Positional testing revealed continued R posterior canalithiasis. Patient was again treated with Li maneuver with good tolerance. Retesting was done with frenzel goggles donned to pick up on nystagmus; this was negative. Proceeded with review and consolidation of HEP to prepare for DC next session. Patient denies dizziness at end of session.    OBJECTIVE IMPAIRMENTS: Abnormal gait, decreased activity tolerance, decreased balance, difficulty walking, decreased ROM, decreased strength, dizziness, increased muscle spasms, improper body mechanics, and pain.   ACTIVITY LIMITATIONS: carrying, lifting, stairs, bed mobility, reach over head, and locomotion level  PARTICIPATION LIMITATIONS: meal prep, cleaning,  interpersonal relationship, shopping, and exercise  PERSONAL FACTORS: Age, Time since onset of injury/illness/exacerbation, and 3+ comorbidities: PMH  are also affecting patient's functional outcome.   REHAB POTENTIAL: Excellent  CLINICAL DECISION MAKING: Evolving/moderate complexity  EVALUATION COMPLEXITY: Moderate   PLAN:  PT FREQUENCY: 2x/week for 3 weeks followed by 1x/week for 3 weeks.  PT DURATION: -  PLANNED INTERVENTIONS: 97110-Therapeutic exercises, 97530- Therapeutic activity, [id number]- Neuromuscular re-education, [phone]- Self Care, [id number]- Manual therapy, [phone]- Gait training, [phone]- Canalith repositioning, [phone]- Aquatic Therapy, Patient/Family education, Balance training, Stair training, Joint mobilization, Spinal mobilization, Vestibular training, and DME instructions  PLAN FOR NEXT SESSION: DC next session     Louana Terrilyn Christians, PT, DPT 12/07/23 10:12 AM  [facility] [address] [place], [place] [address] Phone # [phone] Fax # [phone]

## 2023-12-04 ENCOUNTER — Ambulatory Visit: Payer: Medicare PPO | Admitting: Physical Therapy

## 2023-12-07 ENCOUNTER — Ambulatory Visit: Payer: Medicare PPO | Admitting: Physical Therapy

## 2023-12-07 ENCOUNTER — Encounter: Payer: Self-pay | Admitting: Physical Therapy

## 2023-12-07 DIAGNOSIS — R42 Dizziness and giddiness: Secondary | ICD-10-CM | POA: Diagnosis not present

## 2023-12-07 DIAGNOSIS — R2681 Unsteadiness on feet: Secondary | ICD-10-CM | POA: Diagnosis not present

## 2023-12-07 DIAGNOSIS — R262 Difficulty in walking, not elsewhere classified: Secondary | ICD-10-CM

## 2023-12-07 DIAGNOSIS — M542 Cervicalgia: Secondary | ICD-10-CM | POA: Diagnosis not present

## 2023-12-07 DIAGNOSIS — R2689 Other abnormalities of gait and mobility: Secondary | ICD-10-CM

## 2023-12-08 NOTE — Therapy (Signed)
 OUTPATIENT PHYSICAL THERAPY VESTIBULAR DISCHARGE     Patient Name: Christina Mcclain MRN: [id number] DOB:1956-04-29, 68 y.o., female Today's Date: 12/09/2023   Progress Note Reporting Period 12/02/23 to 12/09/23  See note below for Objective Data and Assessment of Progress/Goals.      END OF SESSION:  PT End of Session - 12/09/23 1010     Visit Number 13    Number of Visits 14    Date for PT Re-Evaluation 12/09/23    Authorization Type [employer]    Authorization Time Period approved total of 14 PT visits from 09/30/2023 - 12/09/2023   new auth submitted   Authorization - Visit Number 13    Authorization - Number of Visits 14    PT Start Time [id number]   pt late   PT Stop Time 1012    PT Time Calculation (min) 28 min    Equipment Utilized During Treatment Gait belt    Activity Tolerance Patient tolerated treatment well    Behavior During Therapy WFL for tasks assessed/performed                       Past Medical History:  Diagnosis Date   Adverse effect of anesthesia    hard to awaken   Anemia    Arthritis    Asthma    Asthma without status asthmaticus    unspecified; Take advair  each night   Breast cancer (HCC) 2021   right breast IMC   Bronchitis    Chronic kidney disease    stage III per dr Rhesa Celeste   Constipation    Diabetes (HCC) 2010   Diabetes mellitus type 2, uncomplicated (HCC)    Diabetes mellitus without complication (HCC)    Encounter for blood transfusion    hysterectomy 9-10 years ago   Heart murmur    unspecified   Hyperlipidemia    Hypertension 1991   Hypothyroid    Multiple thyroid  nodules    Neuromuscular disorder (HCC)    neuropathy   Osteoarthritis of right hip 02/04/2017   Palpitations    Pernicious anemia    Personal history of chemotherapy 2020-2021   Right breast ca   Personal history of radiation therapy 2021   right breast [facility]   Pneumonia    Sleep apnea    Use C-PAP; can't use mouth piece. Anxiety   Thyroid   disease    nodules   Thyroid  nodule    x2   Urinary urgency    Past Surgical History:  Procedure Laterality Date   ABDOMINAL HYSTERECTOMY  2011   partial   ABDOMINAL SURGERY     BREAST BIOPSY Right 10/02/2020   affirm bx, x marker, fat necrosis   BREAST BIOPSY Right 10/19/2019   two masses at 9:00 us  bx, coil marker and venus marker, IMC for both   BREAST LUMPECTOMY Right 03/28/2020   bracketing of two IMC masses at 9:00.  Venus marker was pushed medially during wire loc. Remains in breast.  Clear surgical margins. Negative Lymph node   CARPAL TUNNEL RELEASE Left 2008   CARPAL TUNNEL RELEASE Right    CHOLECYSTECTOMY  1982   COLONOSCOPY WITH PROPOFOL  N/A 10/05/2017   Procedure: COLONOSCOPY WITH PROPOFOL ;  Surgeon: Cassie Click, MD;  Location: [facility] ENDOSCOPY;  Service: Endoscopy;  Laterality: N/A;   COLONOSCOPY WITH PROPOFOL  N/A 08/09/2021   Procedure: COLONOSCOPY WITH PROPOFOL ;  Surgeon: Quintin Buckle, DO;  Location: [facility] ENDOSCOPY;  Service: Gastroenterology;  Laterality: N/A;  DIAGNOSTIC LAPAROSCOPY     ESOPHAGOGASTRODUODENOSCOPY (EGD) WITH PROPOFOL  N/A 08/09/2021   Procedure: ESOPHAGOGASTRODUODENOSCOPY (EGD) WITH PROPOFOL ;  Surgeon: Quintin Buckle, DO;  Location: [facility] ENDOSCOPY;  Service: Gastroenterology;  Laterality: N/A;   JOINT REPLACEMENT Right 02/09/2017   TOTAL HIP REPLACEMENT, DR. CARTER. [place]    KNEE ARTHROSCOPY  1990s   PARTIAL MASTECTOMY WITH NEEDLE LOCALIZATION AND AXILLARY SENTINEL LYMPH NODE BX Right 03/28/2020   Procedure: PARTIAL MASTECTOMY WITH NEEDLE LOCALIZATION AND AXILLARY SENTINEL LYMPH NODE BX;  Surgeon: Eldred Grego, MD;  Location: [facility] ORS;  Service: General;  Laterality: Right;   PORTACATH PLACEMENT Left 11/07/2019   Procedure: INSERTION PORT-A-CATH;  Surgeon: Eldred Grego, MD;  Location: [facility] ORS;  Service: General;  Laterality: Left;   TOTAL HIP ARTHROPLASTY Left 06/23/2017   Procedure: TOTAL HIP ARTHROPLASTY  ANTERIOR APPROACH;  Surgeon: Molli Angelucci, MD;  Location: [facility] ORS;  Service: Orthopedics;  Laterality: Left;   Patient Active Problem List   Diagnosis Date Noted   Hypertension associated with diabetes (HCC) 10/16/2023   Combined hyperlipidemia associated with type 2 diabetes mellitus (HCC) 10/16/2023   SOB (shortness of breath) 08/11/2023   Urinary tract infection without hematuria 04/09/2023   Mixed hyperlipidemia 04/09/2023   Rash of face 03/17/2023   Absolute anemia 03/17/2023   Palpitations 11/09/2019   Pelvic pressure in female 11/09/2019   Invasive carcinoma of breast (HCC) 10/31/2019   Goals of care, counseling/discussion 10/31/2019   Nonintractable headache 08/03/2019   Tinnitus of both ears 08/03/2019   Bilateral hand numbness 09/14/2018   Bilateral hand pain 09/14/2018   Myalgia due to statin 08/26/2018   BMI 40.0-44.9, adult (HCC) 12/16/2017   Primary localized osteoarthrosis of left hip 06/23/2017   Type 2 diabetes mellitus with stage 3b chronic kidney disease, without long-term current use of insulin (HCC) 03/17/2017   Pernicious anemia 03/17/2017   Primary osteoarthritis involving multiple joints 03/17/2017   Status post right hip replacement 02/20/2017   At high risk for falls 02/11/2017   Mild persistent asthma without complication 02/11/2017   Impaired mobility and ADLs 02/11/2017   Myoclonus 07/23/2016   DISH (diffuse idiopathic skeletal hyperostosis) 07/23/2016   Obstructive sleep apnea syndrome 07/23/2016   Thyromegaly 07/23/2016   Left thyroid  nodule 09/04/2014   Chest pain, unspecified 11/14/2012   High cholesterol 11/14/2012   Gastro-esophageal reflux disease without esophagitis 11/14/2012   Family history of early CAD 11/14/2012   Overactive bladder 04/12/2012   Hemorrhoids 01/08/2010   Essential hypertension, benign 01/08/2010   Osteoarthritis of right hip 01/08/2010    PCP: Aisha Hove, MD REFERRING PROVIDER: Paich, Kaitlin,  PA-C  REFERRING DIAG: G62.9 (ICD-10-CM) - Polyneuropathy, unspecified R26.89 (ICD-10-CM) - Other abnormalities of gait and mobility  THERAPY DIAG:  Unsteadiness on feet  Difficulty in walking, not elsewhere classified  Other abnormalities of gait and mobility  Dizziness and giddiness  Cervicalgia  ONSET DATE: worse past 6 months  Rationale for Evaluation and Treatment: Rehabilitation  SUBJECTIVE:   SUBJECTIVE STATEMENT: Doing okay- just rushing. Has been doing the exercises and "it was a lot better." Feels good about DC today. Denies concerns about HEP. Difficulty with gargling is "about the same."  Pt accompanied by: self  PERTINENT HISTORY: Vertigo, several minutes, sudden onset in supine position Sudden onset vertigo in the supine position 09/15/2023, resolved. Headache this morning, resolved by time of appointment. R TKR, left knee OA  PAIN:  Are you having pain? Yes: NPRS scale: "a little bit"/10 Pain location: R posterior neck Pain description: "  dull, stiff" Aggravating factors: movement Relieving factors: nothing   PRECAUTIONS: Fall  RED FLAGS: None   WEIGHT BEARING RESTRICTIONS: No  FALLS: Has patient fallen in last 6 months? No  LIVING ENVIRONMENT: Lives with: lives with their spouse Lives in: House/apartment Stairs: No Has following equipment at home: Single point cane  PLOF: Independent with basic ADLs and Independent with household mobility without device  PATIENT GOALS:   OBJECTIVE:     TODAY'S TREATMENT: 12/09/23   CERVICAL ROM:    Active ROM AROM (deg) 10/28/23 AROM 11/27/23 AROM 12/09/23  Flexion 30 35 36  Extension 14 *pain 15 *pain 20 *pain  Right lateral flexion 19 *pain 20  22  Left lateral flexion 20 *pain  22 22  Right rotation 36  40 36  Left rotation 39 30 40   (Blank rows = not tested)   Activity Comments  MCTSIB #4 30 sec, mild- normal sway   DGI 16/24  R sidelying test  5-7 sec R upbeating torsional nystagmus  R  Li maneuver  Tolerated well  R sidelying test  Negative         [facility] PT Assessment - 12/09/23 0001       Dynamic Gait Index   Level Surface Mild Impairment    Change in Gait Speed Normal    Gait with Horizontal Head Turns Normal    Gait with Vertical Head Turns Normal    Gait and Pivot Turn Mild Impairment    Step Over Obstacle Severe Impairment    Step Around Obstacles Mild Impairment    Steps Moderate Impairment    Total Score 16    DGI comment: baseline gait still antalgic and limited in L knee flexion              PATIENT EDUCATION: Education details: progress towards towards goals and remaining impairments, DC Person educated: Patient Education method: Explanation, Demonstration, Tactile cues, and Verbal cues Education comprehension: verbalized understanding   HOME EXERCISE PROGRAM Last updated: 12/07/23 Access Code: [email or web address] URL: [email or web address] Date: 12/07/2023 Prepared by: [facility] - Outpatient  Rehab - [facility]  Exercises - Brandt-Daroff Vestibular Exercise  - 1 x daily - 5 x weekly - Seated Cervical Retraction  - 1 x daily - 7 x weekly - 2-5 sec hold - Seated Shoulder Row with Anchored Resistance  - 1 x daily - 7 x weekly - 3 sets - 10 reps - Seated Assisted Cervical Rotation with Towel  - 1 x daily - 5 x weekly - 2 sets - 10 reps - Cervical Extension AROM with Strap  - 1 x daily - 5 x weekly - 2 sets - 10 reps - Supine Heel Slide with Strap  - 1 x daily - 5 x weekly - 2 sets - 10 reps - 3-5 sec hold - Quad Setting and Stretching  - 1 x daily - 5 x weekly - 2 sets - 10 reps - 3 sec hold     10/07/23 VESTIBULAR ASSESSMENT   GENERAL OBSERVATION: wears progressive lens bifocals, holds head in left lateral tilt    SYMPTOM BEHAVIOR:   Subjective history: hx of vertigo, reports feeling of fullness in right ear over the past week   Non-Vestibular symptoms: headaches   Type of dizziness: Spinning/Vertigo   Frequency:  daily   Duration: seconds/minutes   Aggravating factors: Induced by position change: rolling to the right   Relieving factors: slow movements   Progression of symptoms: unchanged   OCULOMOTOR EXAM:  Ocular Alignment: normal   Ocular ROM: No Limitations   Spontaneous Nystagmus: absent   Gaze-Induced Nystagmus: absent   Smooth Pursuits: intact   Saccades: intact   Convergence/Divergence: 8 cm      VESTIBULAR - OCULAR REFLEX:    Slow VOR: Normal   VOR Cancellation: Normal   Head-Impulse Test: HIT Right: negative HIT Left: negative   Dynamic Visual Acuity: Not able to be assessed      POSITIONAL TESTING: Right Dix-Hallpike: no nystagmus Left Dix-Hallpike: no nystagmus Right Roll Test:  no nystagmus but reports brief spinning sensation Left Roll Test: no nystagmus      Note: Objective measures were completed at Evaluation unless otherwise noted.  DIAGNOSTIC FINDINGS: IMPRESSION: No acute intracranial abnormality.  COGNITION: Overall cognitive status: Within functional limits for tasks assessed   SENSATION: Not tested Reports stocking distribution of symptoms in her feet Pain in left hand > right EDEMA:  Slight in bilat ankles  MUSCLE TONE:  NT  DTRs:  NT  POSTURE:  No Significant postural limitations  Cervical ROM:    Globally limited ROM extension > lateral flexion > rotation  STRENGTH:   LOWER EXTREMITY MMT:   BLE gross 4/5 strength  BED MOBILITY:  indep  TRANSFERS: Assistive device utilized: None  Sit to stand: Complete Independence Stand to sit: Complete Independence Chair to chair: Complete Independence Floor:  NT  RAMP: NT  CURB: Modified independence  GAIT: Gait pattern:  very limited left knee flexion during gait due to antalgia left knee OA Distance walked:  Assistive device utilized: Single point cane and None Level of assistance: Modified independence Comments:   FUNCTIONAL TESTS:  5 times sit to stand: 22.47  sec Dynamic Gait Index: 10/24  M-CTSIB  Condition 1: Firm Surface, EO 30 Sec, Normal Sway  Condition 2: Firm Surface, EC 30 Sec, Mild Sway  Condition 3: Foam Surface, EO 30 Sec, Mild Sway  Condition 4: Foam Surface, EC 30 Sec, Moderate Sway        GOALS: Goals reviewed with patient? Yes  SHORT TERM GOALS: Target date: same as LTG    LONG TERM GOALS: Target date: 12/09/2023    The patient will be independent with HEP for gaze adaptation, habituation, balance, and general mobility. Baseline: reports limited compliance 10/28/23; updated 11/27/23 Goal status: MET 12/09/23  2.  Demo improved postural stability per normal-mild sway condition 4 M-CTSIB to improve safety with ADL Baseline: mild sway 10/28/23; mild sway 11/27/23; mild-normal sway 12/09/23 Goal status: MET 12/09/23  3.  Reduce risk for falls per score 19/24 Dynamic Gait Index Baseline: 10/24; 13/24 10/28/23; 15/24 11/27/23; 16/24 12/09/23 Goal status: NOT MET 12/09/23  4.  Pt to report absence of positional dizziness to improve comfort and reduce risk for falls during mobility Baseline: upbeating torsional nystagmus lasting ~15 sec in R sidelying test 10/28/23; negative after CRM 12/09/23 Goal status: PARTIALLY MET 12/09/23  5.  Patient to demonstrate at least 10 deg improvement in cervical AROM in all planes to improve ability to position head with CRM.  Baseline: see above; improved but not quite met 11/27/23; improved but not quite met 12/09/23 Goal status: PARTIALLY MET 12/09/23  6.  Patient to report 50% improvement in ease of performing dental hygiene without neck restriction limiting.  Baseline: reports difficulty brushing teeth and gargling 10/28/23; Reports no change in ability to gargle d/t neck restriction 11/27/23; reports no change 12/09/23 Goal status: NOT MET 12/09/23    ASSESSMENT:  CLINICAL IMPRESSION: Patient arrived to  session with report of improved dizziness with her HEP recently. Patient reports good  understanding of HEP and denies issues. Patient scored 16/24 on DGI, indicating risk of falls. This is improved from initial assessment and balance now appears to be limited by L knee antalgia/reduced ROM. Cervical AROM has improved in extension, flexion, R SBing, and L rotation. However, patient reports remaining difficulty gargling for dental hygiene. Patient still with positive positional testing, however she has responded well to CRM in past sessions and has been educated on self-CRM. Patient has made some progress towards goals. Now ready for DC with transition to HEP.    OBJECTIVE IMPAIRMENTS: Abnormal gait, decreased activity tolerance, decreased balance, difficulty walking, decreased ROM, decreased strength, dizziness, increased muscle spasms, improper body mechanics, and pain.   ACTIVITY LIMITATIONS: carrying, lifting, stairs, bed mobility, reach over head, and locomotion level  PARTICIPATION LIMITATIONS: meal prep, cleaning, interpersonal relationship, shopping, and exercise  PERSONAL FACTORS: Age, Time since onset of injury/illness/exacerbation, and 3+ comorbidities: PMH  are also affecting patient's functional outcome.   REHAB POTENTIAL: Excellent  CLINICAL DECISION MAKING: Evolving/moderate complexity  EVALUATION COMPLEXITY: Moderate   PLAN:  PT FREQUENCY: 2x/week for 3 weeks followed by 1x/week for 3 weeks.  PT DURATION: -  PLANNED INTERVENTIONS: 97110-Therapeutic exercises, 97530- Therapeutic activity, [id number]- Neuromuscular re-education, [phone]- Self Care, 95621- Manual therapy, [phone]- Gait training, [phone]- Canalith repositioning, [phone]- Aquatic Therapy, Patient/Family education, Balance training, Stair training, Joint mobilization, Spinal mobilization, Vestibular training, and DME instructions  PLAN FOR NEXT SESSION: DC at this time    PHYSICAL THERAPY DISCHARGE SUMMARY  Visits from Start of Care: 13  Current functional level related to goals / functional  outcomes: See above clinical impression   Remaining deficits: Imbalance d/t L knee deficits, dizziness, cervical tightness    Education / Equipment: HEP  Plan: Patient agrees to discharge.  Patient goals were partially met. Patient is being discharged due to satisfaction with CLOF.         Thaddeus Filippo, PT, DPT 12/09/23 10:13 AM  [facility] [address] [place], [address] [place], [place] [address] Phone # [phone] Fax # [phone]

## 2023-12-09 ENCOUNTER — Ambulatory Visit: Payer: Medicare PPO | Admitting: Physical Therapy

## 2023-12-09 ENCOUNTER — Encounter: Payer: Self-pay | Admitting: Physical Therapy

## 2023-12-09 DIAGNOSIS — R262 Difficulty in walking, not elsewhere classified: Secondary | ICD-10-CM | POA: Diagnosis not present

## 2023-12-09 DIAGNOSIS — R2689 Other abnormalities of gait and mobility: Secondary | ICD-10-CM | POA: Diagnosis not present

## 2023-12-09 DIAGNOSIS — M542 Cervicalgia: Secondary | ICD-10-CM

## 2023-12-09 DIAGNOSIS — R42 Dizziness and giddiness: Secondary | ICD-10-CM

## 2023-12-09 DIAGNOSIS — R2681 Unsteadiness on feet: Secondary | ICD-10-CM | POA: Diagnosis not present

## 2023-12-24 DIAGNOSIS — H52223 Regular astigmatism, bilateral: Secondary | ICD-10-CM | POA: Diagnosis not present

## 2023-12-24 DIAGNOSIS — E119 Type 2 diabetes mellitus without complications: Secondary | ICD-10-CM | POA: Diagnosis not present

## 2023-12-24 DIAGNOSIS — H2513 Age-related nuclear cataract, bilateral: Secondary | ICD-10-CM | POA: Diagnosis not present

## 2023-12-24 DIAGNOSIS — H16223 Keratoconjunctivitis sicca, not specified as Sjogren's, bilateral: Secondary | ICD-10-CM | POA: Diagnosis not present

## 2023-12-24 DIAGNOSIS — H524 Presbyopia: Secondary | ICD-10-CM | POA: Diagnosis not present

## 2023-12-28 DIAGNOSIS — L218 Other seborrheic dermatitis: Secondary | ICD-10-CM | POA: Diagnosis not present

## 2024-01-01 DIAGNOSIS — I10 Essential (primary) hypertension: Secondary | ICD-10-CM | POA: Diagnosis not present

## 2024-01-01 DIAGNOSIS — E119 Type 2 diabetes mellitus without complications: Secondary | ICD-10-CM | POA: Diagnosis not present

## 2024-01-01 DIAGNOSIS — H2511 Age-related nuclear cataract, right eye: Secondary | ICD-10-CM | POA: Diagnosis not present

## 2024-01-01 DIAGNOSIS — H2513 Age-related nuclear cataract, bilateral: Secondary | ICD-10-CM | POA: Diagnosis not present

## 2024-01-04 ENCOUNTER — Other Ambulatory Visit: Payer: Self-pay | Admitting: Internal Medicine

## 2024-01-04 DIAGNOSIS — R200 Anesthesia of skin: Secondary | ICD-10-CM

## 2024-01-05 ENCOUNTER — Other Ambulatory Visit: Payer: Medicare PPO

## 2024-01-05 DIAGNOSIS — E7800 Pure hypercholesterolemia, unspecified: Secondary | ICD-10-CM | POA: Diagnosis not present

## 2024-01-05 DIAGNOSIS — D508 Other iron deficiency anemias: Secondary | ICD-10-CM | POA: Diagnosis not present

## 2024-01-05 DIAGNOSIS — E118 Type 2 diabetes mellitus with unspecified complications: Secondary | ICD-10-CM | POA: Diagnosis not present

## 2024-01-06 LAB — CBC WITH DIFFERENTIAL/PLATELET
Basophils Absolute: 0.1 10*3/uL (ref 0.0–0.2)
Basos: 1 %
EOS (ABSOLUTE): 0.1 10*3/uL (ref 0.0–0.4)
Eos: 3 %
Hematocrit: 40.1 % (ref 34.0–46.6)
Hemoglobin: 12.2 g/dL (ref 11.1–15.9)
Immature Grans (Abs): 0 10*3/uL (ref 0.0–0.1)
Immature Granulocytes: 0 %
Lymphocytes Absolute: 2.1 10*3/uL (ref 0.7–3.1)
Lymphs: 49 %
MCH: 24.6 pg — ABNORMAL LOW (ref 26.6–33.0)
MCHC: 30.4 g/dL — ABNORMAL LOW (ref 31.5–35.7)
MCV: 81 fL (ref 79–97)
Monocytes Absolute: 0.7 10*3/uL (ref 0.1–0.9)
Monocytes: 15 %
Neutrophils Absolute: 1.4 10*3/uL (ref 1.4–7.0)
Neutrophils: 32 %
Platelets: 156 10*3/uL (ref 150–450)
RBC: 4.95 x10E6/uL (ref 3.77–5.28)
RDW: 14.6 % (ref 11.7–15.4)
WBC: 4.3 10*3/uL (ref 3.4–10.8)

## 2024-01-06 LAB — LIPID PANEL W/O CHOL/HDL RATIO
Cholesterol, Total: 177 mg/dL (ref 100–199)
HDL: 67 mg/dL (ref >39)
LDL Chol Calc (NIH): 95 mg/dL (ref 0–99)
Triglycerides: 80 mg/dL (ref 0–149)
VLDL Cholesterol Cal: 15 mg/dL (ref 5–40)

## 2024-01-06 LAB — HEMOGLOBIN A1C
Est. average glucose Bld gHb Est-mCnc: 154 mg/dL
Hgb A1c MFr Bld: 7 % — ABNORMAL HIGH (ref 4.8–5.6)

## 2024-01-14 DIAGNOSIS — Z6841 Body Mass Index (BMI) 40.0 and over, adult: Secondary | ICD-10-CM | POA: Diagnosis not present

## 2024-01-14 DIAGNOSIS — E782 Mixed hyperlipidemia: Secondary | ICD-10-CM | POA: Diagnosis not present

## 2024-01-14 DIAGNOSIS — E1169 Type 2 diabetes mellitus with other specified complication: Secondary | ICD-10-CM | POA: Diagnosis not present

## 2024-01-14 DIAGNOSIS — I129 Hypertensive chronic kidney disease with stage 1 through stage 4 chronic kidney disease, or unspecified chronic kidney disease: Secondary | ICD-10-CM | POA: Diagnosis not present

## 2024-01-14 DIAGNOSIS — R252 Cramp and spasm: Secondary | ICD-10-CM | POA: Diagnosis not present

## 2024-01-14 DIAGNOSIS — N183 Chronic kidney disease, stage 3 unspecified: Secondary | ICD-10-CM | POA: Diagnosis not present

## 2024-01-18 ENCOUNTER — Other Ambulatory Visit: Payer: Self-pay | Admitting: Internal Medicine

## 2024-01-18 DIAGNOSIS — I129 Hypertensive chronic kidney disease with stage 1 through stage 4 chronic kidney disease, or unspecified chronic kidney disease: Secondary | ICD-10-CM | POA: Diagnosis not present

## 2024-01-18 DIAGNOSIS — I10 Essential (primary) hypertension: Secondary | ICD-10-CM | POA: Diagnosis not present

## 2024-01-18 DIAGNOSIS — I5032 Chronic diastolic (congestive) heart failure: Secondary | ICD-10-CM | POA: Diagnosis not present

## 2024-01-18 DIAGNOSIS — N183 Chronic kidney disease, stage 3 unspecified: Secondary | ICD-10-CM | POA: Diagnosis not present

## 2024-01-18 DIAGNOSIS — Z6841 Body Mass Index (BMI) 40.0 and over, adult: Secondary | ICD-10-CM | POA: Diagnosis not present

## 2024-01-18 DIAGNOSIS — R252 Cramp and spasm: Secondary | ICD-10-CM | POA: Diagnosis not present

## 2024-01-19 ENCOUNTER — Other Ambulatory Visit: Payer: Self-pay | Admitting: Internal Medicine

## 2024-01-19 DIAGNOSIS — R3129 Other microscopic hematuria: Secondary | ICD-10-CM | POA: Diagnosis not present

## 2024-01-19 DIAGNOSIS — N9419 Other specified dyspareunia: Secondary | ICD-10-CM | POA: Diagnosis not present

## 2024-01-19 DIAGNOSIS — N952 Postmenopausal atrophic vaginitis: Secondary | ICD-10-CM | POA: Diagnosis not present

## 2024-01-19 DIAGNOSIS — N950 Postmenopausal bleeding: Secondary | ICD-10-CM | POA: Diagnosis not present

## 2024-01-19 MED ORDER — SOLIFENACIN SUCCINATE 10 MG PO TABS: 10.0000 mg | ORAL_TABLET | Freq: Every evening | ORAL | 1 refills | Status: DC

## 2024-01-21 ENCOUNTER — Ambulatory Visit: Payer: Medicare PPO | Admitting: Cardiology

## 2024-01-25 ENCOUNTER — Inpatient Hospital Stay: Payer: Medicare PPO | Attending: Oncology | Admitting: Oncology

## 2024-01-25 ENCOUNTER — Encounter: Payer: Self-pay | Admitting: Oncology

## 2024-01-25 VITALS — BP 127/66 | HR 65 | Temp 98.6°F | Resp 18 | Wt 223.8 lb

## 2024-01-25 DIAGNOSIS — C50911 Malignant neoplasm of unspecified site of right female breast: Secondary | ICD-10-CM | POA: Insufficient documentation

## 2024-01-25 DIAGNOSIS — Z170 Estrogen receptor positive status [ER+]: Secondary | ICD-10-CM | POA: Insufficient documentation

## 2024-01-25 DIAGNOSIS — Z1731 Human epidermal growth factor receptor 2 positive status: Secondary | ICD-10-CM | POA: Diagnosis not present

## 2024-01-25 DIAGNOSIS — Z1721 Progesterone receptor positive status: Secondary | ICD-10-CM | POA: Diagnosis not present

## 2024-01-25 DIAGNOSIS — Z08 Encounter for follow-up examination after completed treatment for malignant neoplasm: Secondary | ICD-10-CM | POA: Diagnosis not present

## 2024-01-25 DIAGNOSIS — Z79811 Long term (current) use of aromatase inhibitors: Secondary | ICD-10-CM | POA: Insufficient documentation

## 2024-01-25 DIAGNOSIS — Z853 Personal history of malignant neoplasm of breast: Secondary | ICD-10-CM

## 2024-01-25 DIAGNOSIS — Z79899 Other long term (current) drug therapy: Secondary | ICD-10-CM | POA: Insufficient documentation

## 2024-01-25 DIAGNOSIS — Z9221 Personal history of antineoplastic chemotherapy: Secondary | ICD-10-CM | POA: Insufficient documentation

## 2024-01-25 NOTE — Progress Notes (Unsigned)
 Patient states she has a little fullness of the left breast.

## 2024-01-26 ENCOUNTER — Other Ambulatory Visit: Payer: Self-pay

## 2024-01-28 ENCOUNTER — Encounter: Payer: Self-pay | Admitting: Oncology

## 2024-01-28 NOTE — Progress Notes (Signed)
 Hematology/Oncology Consult note [facility]  [phone] [phone]  Patient Care Team: Margaretann Loveless, MD as PCP - General (Internal Medicine) Scarlett Presto, RN (Inactive) as Registered Nurse (Oncology) Creig Hines, MD as Consulting Physician (Hematology and Oncology) Creig Hines, MD as Consulting Physician (Hematology and Oncology) Creig Hines, MD as Consulting Physician (Hematology and Oncology) Carmina Miller, MD as Consulting Physician (Radiation Oncology) Carolan Shiver, MD as Consulting Physician (General Surgery)   Name of the patient: Christina Mcclain  [id number]  1956/07/08   Date of visit: 01/28/24  Diagnosis- 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 It showed 2 suspicious lesions in the right breast. Diagnostic mammogram and ultrasound confirmed 2 masses in the 9 o'clock position of the right breast measuring 1.1 x 1.2 x 1.2 cm and the other one measuring 0.8 x 0.8 x 0.9 cm. These 2 masses were 1.7 cm apart. Sonographic evaluation of the right axilla did not show any enlarged adenopathy. 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    Baseline MUGA scan showed EF of 58%.  MRI showed two distinct masses in the right breast 1.5 cm and 1.2 cm 2 cm apart no additional suspicious masses or abnormal enhancement identified.  No suspicious adenopathy   6 cycles of neoadjuvant TCHP chemotherapy completed on 02/29/2020.    Final pathology showed residual tumor of 5 mm.Negative margins.  1 sentinel lymph  node negative for malignancy.  Overall cancer cellularity 1%.  Percentage of cancer that is in situ 0%.  [id number]   Patient is started on adjuvant Kadcyla on 04/26/2020.  Treatment complicated by thrombocytopenia despite dose reduction. Patient switched to adjuvant Herceptin. Perjeta not continued due to significant diarrhea in the neoadjuvant setting.  Patient completed adjuvant Herceptin in February 2022.  She started letrozole sometime in July 2021.    Interval history-patient will be undergoing left knee replacement surgery soon.  Denies any overt breast concerns at this time and is tolerating letrozole well without any significant side effects  ECOG PS- 2 Pain scale- 0   Review of systems- Review of Systems  Constitutional:  Positive for malaise/fatigue. Negative for chills, fever and weight loss.  HENT:  Negative for congestion, ear discharge and nosebleeds.   Eyes:  Negative for blurred vision.  Respiratory:  Negative for cough, hemoptysis, sputum production, shortness of breath and wheezing.   Cardiovascular:  Negative for chest pain, palpitations, orthopnea and claudication.  Gastrointestinal:  Negative for abdominal pain, blood in stool, constipation, diarrhea, heartburn, melena, nausea and vomiting.  Genitourinary:  Negative for dysuria, flank pain, frequency, hematuria and urgency.  Musculoskeletal:  Positive for joint pain. Negative for back pain and myalgias.  Skin:  Negative for rash.  Neurological:  Negative for dizziness, tingling, focal weakness, seizures, weakness and headaches.  Endo/Heme/Allergies:  Does not bruise/bleed easily.  Psychiatric/Behavioral:  Negative for depression and suicidal ideas. The patient does not have insomnia.       Allergies  Allergen Reactions   Amlodipine Other (See Comments)    edema   Statins Rash    Other reaction(s):  Muscle Pain Possible hives also     Past Medical History:  Diagnosis Date   Adverse effect of anesthesia    hard  to awaken   Anemia    Arthritis    Asthma    Asthma without status asthmaticus    unspecified; Take advair each night   Breast cancer (HCC) 2021   right breast IMC   Bronchitis    Chronic kidney disease    stage III per dr Cherylann Ratel   Constipation    Diabetes (HCC) 2010   Diabetes mellitus type 2, uncomplicated (HCC)    Diabetes mellitus without complication (HCC)    Encounter for blood transfusion    hysterectomy 9-10 years ago   Heart murmur    unspecified   Hyperlipidemia    Hypertension 1991   Hypothyroid    Multiple thyroid nodules    Neuromuscular disorder (HCC)    neuropathy   Osteoarthritis of right hip 02/04/2017   Palpitations    Pernicious anemia    Personal history of chemotherapy 2020-2021   Right breast ca   Personal history of radiation therapy 2021   right breast [facility]   Pneumonia    Sleep apnea    Use C-PAP; can't use mouth piece. Anxiety   Thyroid disease    nodules   Thyroid nodule    x2   Urinary urgency      Past Surgical History:  Procedure Laterality Date   ABDOMINAL HYSTERECTOMY  2011   partial   ABDOMINAL SURGERY     BREAST BIOPSY Right 10/02/2020   affirm bx, x marker, fat necrosis   BREAST BIOPSY Right 10/19/2019   two masses at 9:00 [place] bx, coil marker and venus marker, IMC for both   BREAST LUMPECTOMY Right 03/28/2020   bracketing of two IMC masses at 9:00.  Venus marker was pushed medially during wire loc. Remains in breast.  Clear surgical margins. Negative Lymph node   CARPAL TUNNEL RELEASE Left 2008   CARPAL TUNNEL RELEASE Right    CHOLECYSTECTOMY  1982   COLONOSCOPY WITH PROPOFOL N/A 10/05/2017   Procedure: COLONOSCOPY WITH PROPOFOL;  Surgeon: Scot Jun, MD;  Location: [facility] ENDOSCOPY;  Service: Endoscopy;  Laterality: N/A;   COLONOSCOPY WITH PROPOFOL N/A 08/09/2021   Procedure: COLONOSCOPY WITH PROPOFOL;  Surgeon: Jaynie Collins, DO;  Location: [facility] ENDOSCOPY;  Service: Gastroenterology;  Laterality: N/A;    DIAGNOSTIC LAPAROSCOPY     ESOPHAGOGASTRODUODENOSCOPY (EGD) WITH PROPOFOL N/A 08/09/2021   Procedure: ESOPHAGOGASTRODUODENOSCOPY (EGD) WITH PROPOFOL;  Surgeon: Jaynie Collins, DO;  Location: [facility] ENDOSCOPY;  Service: Gastroenterology;  Laterality: N/A;   JOINT REPLACEMENT Right 02/09/2017   TOTAL HIP REPLACEMENT, DR. CARTER. [place]   KNEE ARTHROSCOPY  1990s   PARTIAL MASTECTOMY WITH NEEDLE LOCALIZATION AND AXILLARY SENTINEL LYMPH NODE BX Right 03/28/2020   Procedure: PARTIAL MASTECTOMY WITH NEEDLE LOCALIZATION AND AXILLARY SENTINEL LYMPH NODE BX;  Surgeon: Carolan Shiver, MD;  Location: [facility] ORS;  Service: General;  Laterality: Right;   PORTACATH PLACEMENT Left 11/07/2019   Procedure: INSERTION PORT-A-CATH;  Surgeon: Carolan Shiver, MD;  Location: [facility] ORS;  Service: General;  Laterality: Left;   TOTAL HIP ARTHROPLASTY Left 06/23/2017   Procedure: TOTAL HIP ARTHROPLASTY ANTERIOR APPROACH;  Surgeon: Kennedy Bucker, MD;  Location: [facility] ORS;  Service: Orthopedics;  Laterality: Left;    Social History   Socioeconomic History   Marital status: Married    Spouse name: Not on file   Number of children: 2   Years of education:  some college   Highest education level: Not on file  Occupational History   Occupation: retired    Comment: [employer]  Tobacco Use   Smoking status: Never   Smokeless tobacco: Never  Vaping Use   Vaping status: Never Used  Substance and Sexual Activity   Alcohol use: No   Drug use: No   Sexual activity: Not Currently  Other Topics Concern   Not on file  Social History Narrative   Not on file   Social Drivers of Health   Financial Resource Strain: Low Risk  (10/05/2023)   Received from [facility]   Overall Financial Resource Strain (CARDIA)    Difficulty of Paying Living Expenses: Not hard at all  Food Insecurity: No Food Insecurity (10/05/2023)   Received from [facility]   Hunger  Vital Sign    Worried About Running Out of Food in the Last Year: Never true    Ran Out of Food in the Last Year: Never true  Transportation Needs: No Transportation Needs (10/05/2023)   Received from [facility] - Transportation    In the past 12 months, has lack of transportation kept you from medical appointments or from getting medications?: No    Lack of Transportation (Non-Medical): No  Physical Activity: Not on file  Stress: No Stress Concern Present (10/20/2023)   [employer] - Occupational Stress Questionnaire    Feeling of Stress : Only a little  Social Connections: Not on file  Intimate Partner Violence: Not on file    Family History  Problem Relation Age of Onset   Heart disease Mother    Hypertension Mother    Arrhythmia Mother    Heart attack Mother    Diabetes Mother    Hypertension Father    Parkinson's disease Father    Coronary artery disease Maternal Uncle    Heart attack Maternal Uncle    Diabetes Sister    Diabetes Brother    Heart disease Brother    Breast cancer Neg Hx      Current Outpatient Medications:    acetaminophen (TYLENOL) 500 MG tablet, Take 500 mg by mouth every 6 (six) hours as needed., Disp: , Rfl:    albuterol (PROVENTIL HFA;VENTOLIN HFA) 108 (90 Base) MCG/ACT inhaler, Inhale 2 puffs into the lungs every 6 (six) hours as needed for wheezing or shortness of breath., Disp: , Rfl:    Ascorbic Acid (VITAMIN C) 1000 MG tablet, Take 2 tablets by mouth daily., Disp: , Rfl:    CALCIUM CITRATE PO, Take 1,000 mg by mouth daily., Disp: , Rfl:    carvedilol (COREG) 25 MG tablet, TAKE 1 TABLET BY MOUTH TWICE DAILY, Disp: 180 tablet, Rfl: 3   Cholecalciferol (VITAMIN D-3) 125 MCG (5000 UT) TABS, Take 2 tablets by mouth daily., Disp: , Rfl:    empagliflozin (JARDIANCE) 10 MG TABS tablet, Take 1 tablet (10 mg total) by mouth daily before breakfast., Disp: 30 tablet, Rfl: 2   ezetimibe (ZETIA) 10 MG  tablet, TAKE 1 TABLET BY MOUTH EVERY DAY, Disp: 90 tablet, Rfl: 3   Fluticasone-Salmeterol (ADVAIR) 250-50 MCG/DOSE AEPB, Inhale 1 puff into the lungs 2 (two) times daily. , Disp: , Rfl:    hydrALAZINE (APRESOLINE) 25 MG tablet, Take 1 tablet (25 mg total) by mouth 2 times daily at 12 noon and 4 pm., Disp: 180 tablet, Rfl: 3   letrozole (FEMARA) 2.5 MG tablet, ,  Disp: , Rfl:    loratadine (CLARITIN) 10 MG tablet, Take 10 mg by mouth daily as needed for allergies. , Disp: , Rfl:    losartan (COZAAR) 25 MG tablet, , Disp: , Rfl:    magnesium oxide (MAG-OX) 400 MG tablet, Take 2 tablets by mouth daily., Disp: , Rfl:    pantoprazole (PROTONIX) 40 MG tablet, Take 40 mg by mouth daily., Disp: , Rfl:    pregabalin (LYRICA) 50 MG capsule, TAKE 1 CAPSULE BY MOUTH TWICE DAILY, Disp: 180 capsule, Rfl: 3   solifenacin (VESICARE) 10 MG tablet, Take 1 tablet (10 mg total) by mouth every evening., Disp: 90 tablet, Rfl: 1   cholecalciferol (VITAMIN D) 1000 units tablet, Take 2,000 Units by mouth daily. (Patient not taking: Reported on 10/16/2023), Disp: , Rfl:    furosemide (LASIX) 20 MG tablet, , Disp: , Rfl:   Physical exam:  Vitals:   01/25/24 1054  BP: 127/66  Pulse: 65  Resp: 18  Temp: 98.6 F (37 C)  SpO2: 98%  Weight: 223 lb 12.8 oz (101.5 kg)   Physical Exam Cardiovascular:     Rate and Rhythm: Normal rate and regular rhythm.     Heart sounds: Normal heart sounds.  Pulmonary:     Effort: Pulmonary effort is normal.     Breath sounds: Normal breath sounds.  Abdominal:     General: Bowel sounds are normal.     Palpations: Abdomen is soft.  Skin:    General: Skin is warm and dry.  Neurological:     Mental Status: She is alert and oriented to person, place, and time.    Breast exam was performed in seated and lying down position. Patient is status post right lumpectomy with a well-healed surgical scar. No evidence of any palpable masses. No evidence of axillary adenopathy. No evidence of  any palpable masses or lumps in the left breast. No evidence of leftt axillary adenopathy      Latest Ref Rng & Units 10/16/2023   10:28 AM  CMP  Glucose 70 - 99 mg/dL 90   BUN 8 - 27 mg/dL 28   Creatinine 1.61 - 1.00 mg/dL 0.96   Sodium 045 - 409 mmol/L 145   Potassium 3.5 - 5.2 mmol/L 4.5   Chloride 96 - 106 mmol/L 105   CO2 20 - 29 mmol/L 28   Calcium 8.7 - 10.3 mg/dL 9.9   Total Protein 6.0 - 8.5 g/dL 6.9   Total Bilirubin 0.0 - 1.2 mg/dL 0.3   Alkaline Phos 44 - 121 IU/L 120   AST 0 - 40 IU/L 15   ALT 0 - 32 IU/L 11       Latest Ref Rng & Units 01/05/2024    9:58 AM  CBC  WBC 3.4 - 10.8 x10E3/uL 4.3   Hemoglobin 11.1 - 15.9 g/dL 81.1   Hematocrit 91.4 - 46.6 % 40.1   Platelets 150 - 450 x10E3/uL 156      Assessment and plan- Patient is a 68 y.o. 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  She is s/p 6 cycles of neoadjuvant TCHP chemotherapy with residual 5 mm tumor.  She had persistent cytopenias and thrombocytopenia with Kadcyla and was switched to single agent maintenance Herceptin which she completed in February 2022.  She is here for routine follow-up of breast cancer on letrozole  Clinically patient is doing well with no concerning signs  and symptoms of recurrence based on today's exam.  Her mammogram from November 2024 was unremarkable.  I will see her back in 6 months no labs.  She will be due for a bone density scan at that time.  Patient started taking letrozole in July 2021 and would at least need to take it for 5 years ending in July 2026.   Visit Diagnosis 1. Encounter for follow-up surveillance of breast cancer   2. Use of letrozole (Femara)   3. High risk medication use      Dr. Owens Shark, MD, MPH [facility] [phone] 01/28/2024 5:42 PM

## 2024-01-29 ENCOUNTER — Encounter: Payer: Self-pay | Admitting: Internal Medicine

## 2024-01-29 ENCOUNTER — Ambulatory Visit: Payer: Medicare PPO | Admitting: Internal Medicine

## 2024-01-29 VITALS — BP 132/68 | HR 70 | Ht 62.0 in | Wt 227.2 lb

## 2024-01-29 DIAGNOSIS — E1159 Type 2 diabetes mellitus with other circulatory complications: Secondary | ICD-10-CM

## 2024-01-29 DIAGNOSIS — I152 Hypertension secondary to endocrine disorders: Secondary | ICD-10-CM

## 2024-01-29 DIAGNOSIS — N1832 Chronic kidney disease, stage 3b: Secondary | ICD-10-CM | POA: Diagnosis not present

## 2024-01-29 DIAGNOSIS — E782 Mixed hyperlipidemia: Secondary | ICD-10-CM

## 2024-01-29 DIAGNOSIS — G4733 Obstructive sleep apnea (adult) (pediatric): Secondary | ICD-10-CM

## 2024-01-29 DIAGNOSIS — E1169 Type 2 diabetes mellitus with other specified complication: Secondary | ICD-10-CM | POA: Diagnosis not present

## 2024-01-29 DIAGNOSIS — E1122 Type 2 diabetes mellitus with diabetic chronic kidney disease: Secondary | ICD-10-CM

## 2024-01-29 DIAGNOSIS — K219 Gastro-esophageal reflux disease without esophagitis: Secondary | ICD-10-CM | POA: Diagnosis not present

## 2024-01-29 NOTE — Progress Notes (Signed)
 Established Patient Office Visit  Subjective:  Patient ID: Christina Mcclain, female    DOB: Mar 31, 1956  Age: 68 y.o. MRN: [id number]  Chief Complaint  Patient presents with   Follow-up    3 month follow up    Patient comes in for her follow-up today.  She is generally feeling well.  She has been keeping up with her appointments with all her specialists as well as getting regular physical therapy.  She had labs done recently which were discussed today.    No other concerns at this time.   Past Medical History:  Diagnosis Date   Adverse effect of anesthesia    hard to awaken   Anemia    Arthritis    Asthma    Asthma without status asthmaticus    unspecified; Take advair each night   Breast cancer (HCC) 2021   right breast IMC   Bronchitis    Chronic kidney disease    stage III per dr Cherylann Ratel   Constipation    Diabetes (HCC) 2010   Diabetes mellitus type 2, uncomplicated (HCC)    Diabetes mellitus without complication (HCC)    Encounter for blood transfusion    hysterectomy 9-10 years ago   Heart murmur    unspecified   Hyperlipidemia    Hypertension 1991   Hypothyroid    Multiple thyroid nodules    Neuromuscular disorder (HCC)    neuropathy   Osteoarthritis of right hip 02/04/2017   Palpitations    Pernicious anemia    Personal history of chemotherapy 2020-2021   Right breast ca   Personal history of radiation therapy 2021   right breast [facility]   Pneumonia    Sleep apnea    Use C-PAP; can't use mouth piece. Anxiety   Thyroid disease    nodules   Thyroid nodule    x2   Urinary urgency     Past Surgical History:  Procedure Laterality Date   ABDOMINAL HYSTERECTOMY  2011   partial   ABDOMINAL SURGERY     BREAST BIOPSY Right 10/02/2020   affirm bx, x marker, fat necrosis   BREAST BIOPSY Right 10/19/2019   two masses at 9:00 [place] bx, coil marker and venus marker, IMC for both   BREAST LUMPECTOMY Right 03/28/2020   bracketing of two IMC masses at 9:00.   Venus marker was pushed medially during wire loc. Remains in breast.  Clear surgical margins. Negative Lymph node   CARPAL TUNNEL RELEASE Left 2008   CARPAL TUNNEL RELEASE Right    CHOLECYSTECTOMY  1982   COLONOSCOPY WITH PROPOFOL N/A 10/05/2017   Procedure: COLONOSCOPY WITH PROPOFOL;  Surgeon: Scot Jun, MD;  Location: [facility] ENDOSCOPY;  Service: Endoscopy;  Laterality: N/A;   COLONOSCOPY WITH PROPOFOL N/A 08/09/2021   Procedure: COLONOSCOPY WITH PROPOFOL;  Surgeon: Jaynie Collins, DO;  Location: [facility] ENDOSCOPY;  Service: Gastroenterology;  Laterality: N/A;   DIAGNOSTIC LAPAROSCOPY     ESOPHAGOGASTRODUODENOSCOPY (EGD) WITH PROPOFOL N/A 08/09/2021   Procedure: ESOPHAGOGASTRODUODENOSCOPY (EGD) WITH PROPOFOL;  Surgeon: Jaynie Collins, DO;  Location: [facility] ENDOSCOPY;  Service: Gastroenterology;  Laterality: N/A;   JOINT REPLACEMENT Right 02/09/2017   TOTAL HIP REPLACEMENT, DR. CARTER. [place]   KNEE ARTHROSCOPY  1990s   PARTIAL MASTECTOMY WITH NEEDLE LOCALIZATION AND AXILLARY SENTINEL LYMPH NODE BX Right 03/28/2020   Procedure: PARTIAL MASTECTOMY WITH NEEDLE LOCALIZATION AND AXILLARY SENTINEL LYMPH NODE BX;  Surgeon: Carolan Shiver, MD;  Location: [facility] ORS;  Service: General;  Laterality: Right;  PORTACATH PLACEMENT Left 11/07/2019   Procedure: INSERTION PORT-A-CATH;  Surgeon: Carolan Shiver, MD;  Location: [facility] ORS;  Service: General;  Laterality: Left;   TOTAL HIP ARTHROPLASTY Left 06/23/2017   Procedure: TOTAL HIP ARTHROPLASTY ANTERIOR APPROACH;  Surgeon: Kennedy Bucker, MD;  Location: [facility] ORS;  Service: Orthopedics;  Laterality: Left;    Social History   Socioeconomic History   Marital status: Married    Spouse name: Not on file   Number of children: 2   Years of education: some college   Highest education level: Not on file  Occupational History   Occupation: retired    Comment: [employer]  Tobacco Use   Smoking status: Never    Smokeless tobacco: Never  Vaping Use   Vaping status: Never Used  Substance and Sexual Activity   Alcohol use: No   Drug use: No   Sexual activity: Not Currently  Other Topics Concern   Not on file  Social History Narrative   Not on file   Social Drivers of Health   Financial Resource Strain: Low Risk  (10/05/2023)   Received from [facility]   Overall Financial Resource Strain (CARDIA)    Difficulty of Paying Living Expenses: Not hard at all  Food Insecurity: No Food Insecurity (10/05/2023)   Received from [facility]   Hunger Vital Sign    Worried About Running Out of Food in the Last Year: Never true    Ran Out of Food in the Last Year: Never true  Transportation Needs: No Transportation Needs (10/05/2023)   Received from [facility] - Transportation    In the past 12 months, has lack of transportation kept you from medical appointments or from getting medications?: No    Lack of Transportation (Non-Medical): No  Physical Activity: Not on file  Stress: No Stress Concern Present (10/20/2023)   [employer] [facility] [employer] - Occupational Stress Questionnaire    Feeling of Stress : Only a little  Social Connections: Not on file  Intimate Partner Violence: Not on file    Family History  Problem Relation Age of Onset   Heart disease Mother    Hypertension Mother    Arrhythmia Mother    Heart attack Mother    Diabetes Mother    Hypertension Father    Parkinson's disease Father    Coronary artery disease Maternal Uncle    Heart attack Maternal Uncle    Diabetes Sister    Diabetes Brother    Heart disease Brother    Breast cancer Neg Hx     Allergies  Allergen Reactions   Amlodipine Other (See Comments)    edema   Statins Rash    Other reaction(s): Muscle Pain Possible hives also    Outpatient Medications Prior to Visit  Medication Sig   acetaminophen (TYLENOL) 500 MG tablet Take 500  mg by mouth every 6 (six) hours as needed.   albuterol (PROVENTIL HFA;VENTOLIN HFA) 108 (90 Base) MCG/ACT inhaler Inhale 2 puffs into the lungs every 6 (six) hours as needed for wheezing or shortness of breath.   Ascorbic Acid (VITAMIN C) 1000 MG tablet Take 2 tablets by mouth daily.   CALCIUM CITRATE PO Take 1,000 mg by mouth daily.   carvedilol (COREG) 25 MG tablet TAKE 1 TABLET BY MOUTH TWICE DAILY   Cholecalciferol (VITAMIN D-3) 125 MCG (5000 UT) TABS Take 2 tablets by mouth daily.   empagliflozin (JARDIANCE) 10 MG  TABS tablet Take 1 tablet (10 mg total) by mouth daily before breakfast.   ezetimibe (ZETIA) 10 MG tablet TAKE 1 TABLET BY MOUTH EVERY DAY   Fluticasone-Salmeterol (ADVAIR) 250-50 MCG/DOSE AEPB Inhale 1 puff into the lungs 2 (two) times daily.    furosemide (LASIX) 20 MG tablet    hydrALAZINE (APRESOLINE) 25 MG tablet Take 1 tablet (25 mg total) by mouth 2 times daily at 12 noon and 4 pm.   letrozole (FEMARA) 2.5 MG tablet    loratadine (CLARITIN) 10 MG tablet Take 10 mg by mouth daily as needed for allergies.    losartan (COZAAR) 25 MG tablet    magnesium oxide (MAG-OX) 400 MG tablet Take 2 tablets by mouth daily.   pantoprazole (PROTONIX) 40 MG tablet Take 40 mg by mouth daily.   pregabalin (LYRICA) 50 MG capsule TAKE 1 CAPSULE BY MOUTH TWICE DAILY   solifenacin (VESICARE) 10 MG tablet Take 1 tablet (10 mg total) by mouth every evening.   cholecalciferol (VITAMIN D) 1000 units tablet Take 2,000 Units by mouth daily. (Patient not taking: Reported on 10/16/2023)   No facility-administered medications prior to visit.    Review of Systems  Constitutional: Negative.  Negative for chills, fever and weight loss.  HENT: Negative.  Negative for congestion, hearing loss and sore throat.   Eyes: Negative.   Respiratory: Negative.  Negative for cough and shortness of breath.   Cardiovascular: Negative.  Negative for chest pain, palpitations and leg swelling.  Gastrointestinal:  Negative.  Negative for abdominal pain, constipation, diarrhea, heartburn, nausea and vomiting.  Genitourinary: Negative.  Negative for dysuria and flank pain.  Musculoskeletal: Negative.  Negative for joint pain and myalgias.  Skin: Negative.   Neurological: Negative.  Negative for dizziness and headaches.  Endo/Heme/Allergies: Negative.   Psychiatric/Behavioral: Negative.  Negative for depression and suicidal ideas. The patient is not nervous/anxious.        Objective:   BP 132/68   Pulse 70   Ht 5\' 2"  (1.575 m)   Wt 227 lb 3.2 oz (103.1 kg)   SpO2 96%   BMI 41.56 kg/m   Vitals:   01/29/24 1506  BP: 132/68  Pulse: 70  Height: 5\' 2"  (1.575 m)  Weight: 227 lb 3.2 oz (103.1 kg)  SpO2: 96%  BMI (Calculated): 41.54    Physical Exam Vitals and nursing note reviewed.  Constitutional:      Appearance: Normal appearance.  HENT:     Head: Normocephalic and atraumatic.     Nose: Nose normal.     Mouth/Throat:     Mouth: Mucous membranes are moist.     Pharynx: Oropharynx is clear.  Eyes:     Conjunctiva/sclera: Conjunctivae normal.     Pupils: Pupils are equal, round, and reactive to light.  Cardiovascular:     Rate and Rhythm: Normal rate and regular rhythm.     Pulses: Normal pulses.     Heart sounds: Normal heart sounds. No murmur heard. Pulmonary:     Effort: Pulmonary effort is normal.     Breath sounds: Normal breath sounds. No wheezing.  Abdominal:     General: Bowel sounds are normal.     Palpations: Abdomen is soft.     Tenderness: There is no abdominal tenderness. There is no right CVA tenderness or left CVA tenderness.  Musculoskeletal:        General: Normal range of motion.     Cervical back: Normal range of motion.     Right  lower leg: No edema.     Left lower leg: No edema.  Skin:    General: Skin is warm and dry.  Neurological:     General: No focal deficit present.     Mental Status: She is alert and oriented to person, place, and time.   Psychiatric:        Mood and Affect: Mood normal.        Behavior: Behavior normal.      No results found for any visits on 01/29/24.  Recent Results (from the past 2160 hours)  Lipid Panel w/o Chol/HDL Ratio     Status: None   Collection Time: 01/05/24  9:58 AM  Result Value Ref Range   Cholesterol, Total 177 100 - 199 mg/dL   Triglycerides 80 0 - 149 mg/dL   HDL 67 >86 mg/dL   VLDL Cholesterol Cal 15 5 - 40 mg/dL   LDL Chol Calc (NIH) 95 0 - 99 mg/dL  Hemoglobin V7Q     Status: Abnormal   Collection Time: 01/05/24  9:58 AM  Result Value Ref Range   Hgb A1c MFr Bld 7.0 (H) 4.8 - 5.6 %    Comment:          Prediabetes: 5.7 - 6.4          Diabetes: >6.4          Glycemic control for adults with diabetes: <7.0    Est. average glucose Bld gHb Est-mCnc 154 mg/dL  CBC with Differential/Platelet     Status: Abnormal   Collection Time: 01/05/24  9:58 AM  Result Value Ref Range   WBC 4.3 3.4 - 10.8 x10E3/uL   RBC 4.95 3.77 - 5.28 x10E6/uL   Hemoglobin 12.2 11.1 - 15.9 g/dL   Hematocrit 46.9 62.9 - 46.6 %   MCV 81 79 - 97 fL   MCH 24.6 (L) 26.6 - 33.0 pg   MCHC 30.4 (L) 31.5 - 35.7 g/dL   RDW 52.8 41.3 - 24.4 %   Platelets 156 150 - 450 x10E3/uL   Neutrophils 32 Not Estab. %   Lymphs 49 Not Estab. %   Monocytes 15 Not Estab. %   Eos 3 Not Estab. %   Basos 1 Not Estab. %   Neutrophils Absolute 1.4 1.4 - 7.0 x10E3/uL   Lymphocytes Absolute 2.1 0.7 - 3.1 x10E3/uL   Monocytes Absolute 0.7 0.1 - 0.9 x10E3/uL   EOS (ABSOLUTE) 0.1 0.0 - 0.4 x10E3/uL   Basophils Absolute 0.1 0.0 - 0.2 x10E3/uL   Immature Granulocytes 0 Not Estab. %   Immature Grans (Abs) 0.0 0.0 - 0.1 x10E3/uL      Assessment & Plan:  Patient will continue taking all her medications. Problem List Items Addressed This Visit     Type 2 diabetes mellitus with stage 3b chronic kidney disease, without long-term current use of insulin (HCC)   Gastro-esophageal reflux disease without esophagitis   Obstructive  sleep apnea syndrome   Hypertension associated with diabetes (HCC) - Primary   Combined hyperlipidemia associated with type 2 diabetes mellitus (HCC)    Return in about 4 months (around 05/30/2024).   Total time spent: 30 minutes  Margaretann Loveless, MD  01/29/2024   This document may have been prepared by [facility] Voice Recognition software and as such may include unintentional dictation errors.

## 2024-02-04 ENCOUNTER — Other Ambulatory Visit: Payer: Self-pay | Admitting: Student

## 2024-02-04 DIAGNOSIS — R29898 Other symptoms and signs involving the musculoskeletal system: Secondary | ICD-10-CM

## 2024-02-04 DIAGNOSIS — R2689 Other abnormalities of gait and mobility: Secondary | ICD-10-CM

## 2024-02-04 DIAGNOSIS — R200 Anesthesia of skin: Secondary | ICD-10-CM

## 2024-02-04 DIAGNOSIS — G629 Polyneuropathy, unspecified: Secondary | ICD-10-CM

## 2024-02-08 ENCOUNTER — Ambulatory Visit
Admission: RE | Admit: 2024-02-08 | Discharge: 2024-02-08 | Disposition: A | Discharge status: Home / Self Care | Source: Ambulatory Visit | Attending: Student | Admitting: Student

## 2024-02-08 DIAGNOSIS — R200 Anesthesia of skin: Secondary | ICD-10-CM | POA: Diagnosis not present

## 2024-02-08 DIAGNOSIS — M47812 Spondylosis without myelopathy or radiculopathy, cervical region: Secondary | ICD-10-CM | POA: Diagnosis not present

## 2024-02-08 DIAGNOSIS — R29898 Other symptoms and signs involving the musculoskeletal system: Secondary | ICD-10-CM | POA: Insufficient documentation

## 2024-02-08 DIAGNOSIS — G629 Polyneuropathy, unspecified: Secondary | ICD-10-CM | POA: Diagnosis not present

## 2024-02-08 DIAGNOSIS — R2689 Other abnormalities of gait and mobility: Secondary | ICD-10-CM | POA: Diagnosis not present

## 2024-02-08 DIAGNOSIS — S12000A Unspecified displaced fracture of first cervical vertebra, initial encounter for closed fracture: Secondary | ICD-10-CM | POA: Diagnosis not present

## 2024-02-08 DIAGNOSIS — M4802 Spinal stenosis, cervical region: Secondary | ICD-10-CM | POA: Diagnosis not present

## 2024-02-08 NOTE — Progress Notes (Unsigned)
 Referring Physician:  Janice Coffin, PA-C [address] [place],  [place] [address]  Primary Physician:  Margaretann Loveless, MD  History of Present Illness: 02/08/2024*** Christina Mcclain has a history of HTN, DM, asthma, OSA, GERD, hyperlipidemia, DISH, breast CA, hyperlipidemia, CKD.   Has been seeing neurology at [facility] and CT of cervical spine was ordered to evaluate numbness/tingling in right hand. CT showed nondisplaced fractures involving the posterolateral aspects of the C1 ring.  She is here for further evaluation.   See left fx, does she also have a right sided one?*** Stable, no collar, get MRI? ***   Duration: *** Location: *** Quality: *** Severity: ***  Precipitating: aggravated by *** Modifying factors: made better by *** Weakness: none Timing: ***  She does not smoke.   Bowel/Bladder Dysfunction: none  Conservative measures:  Physical therapy: ***  Multimodal medical therapy including regular antiinflammatories: ***  Injections: *** epidural steroid injections  Past Surgery: ***  Christina Mcclain has ***no symptoms of cervical myelopathy.  The symptoms are causing a significant impact on the patient's life.   Review of Systems:  A 10 point review of systems is negative, except for the pertinent positives and negatives detailed in the HPI.  Past Medical History: Past Medical History:  Diagnosis Date   Adverse effect of anesthesia    hard to awaken   Anemia    Arthritis    Asthma    Asthma without status asthmaticus    unspecified; Take advair each night   Breast cancer (HCC) 2021   right breast IMC   Bronchitis    Chronic kidney disease    stage III per dr Cherylann Ratel   Constipation    Diabetes (HCC) 2010   Diabetes mellitus type 2, uncomplicated (HCC)    Diabetes mellitus without complication (HCC)    Encounter for blood transfusion    hysterectomy 9-10 years ago   Heart murmur    unspecified   Hyperlipidemia    Hypertension 1991    Hypothyroid    Multiple thyroid nodules    Neuromuscular disorder (HCC)    neuropathy   Osteoarthritis of right hip 02/04/2017   Palpitations    Pernicious anemia    Personal history of chemotherapy 2020-2021   Right breast ca   Personal history of radiation therapy 2021   right breast [facility]   Pneumonia    Sleep apnea    Use C-PAP; can't use mouth piece. Anxiety   Thyroid disease    nodules   Thyroid nodule    x2   Urinary urgency     Past Surgical History: Past Surgical History:  Procedure Laterality Date   ABDOMINAL HYSTERECTOMY  2011   partial   ABDOMINAL SURGERY     BREAST BIOPSY Right 10/02/2020   affirm bx, x marker, fat necrosis   BREAST BIOPSY Right 10/19/2019   two masses at 9:00 [place] bx, coil marker and venus marker, IMC for both   BREAST LUMPECTOMY Right 03/28/2020   bracketing of two IMC masses at 9:00.  Venus marker was pushed medially during wire loc. Remains in breast.  Clear surgical margins. Negative Lymph node   CARPAL TUNNEL RELEASE Left 2008   CARPAL TUNNEL RELEASE Right    CHOLECYSTECTOMY  1982   COLONOSCOPY WITH PROPOFOL N/A 10/05/2017   Procedure: COLONOSCOPY WITH PROPOFOL;  Surgeon: Scot Jun, MD;  Location: [facility] ENDOSCOPY;  Service: Endoscopy;  Laterality: N/A;   COLONOSCOPY WITH PROPOFOL N/A 08/09/2021   Procedure: COLONOSCOPY  WITH PROPOFOL;  Surgeon: Jaynie Collins, DO;  Location: [facility] ENDOSCOPY;  Service: Gastroenterology;  Laterality: N/A;   DIAGNOSTIC LAPAROSCOPY     ESOPHAGOGASTRODUODENOSCOPY (EGD) WITH PROPOFOL N/A 08/09/2021   Procedure: ESOPHAGOGASTRODUODENOSCOPY (EGD) WITH PROPOFOL;  Surgeon: Jaynie Collins, DO;  Location: [facility] ENDOSCOPY;  Service: Gastroenterology;  Laterality: N/A;   JOINT REPLACEMENT Right 02/09/2017   TOTAL HIP REPLACEMENT, DR. CARTER. [place]   KNEE ARTHROSCOPY  1990s   PARTIAL MASTECTOMY WITH NEEDLE LOCALIZATION AND AXILLARY SENTINEL LYMPH NODE BX Right 03/28/2020   Procedure: PARTIAL MASTECTOMY  WITH NEEDLE LOCALIZATION AND AXILLARY SENTINEL LYMPH NODE BX;  Surgeon: Carolan Shiver, MD;  Location: [facility] ORS;  Service: General;  Laterality: Right;   PORTACATH PLACEMENT Left 11/07/2019   Procedure: INSERTION PORT-A-CATH;  Surgeon: Carolan Shiver, MD;  Location: [facility] ORS;  Service: General;  Laterality: Left;   TOTAL HIP ARTHROPLASTY Left 06/23/2017   Procedure: TOTAL HIP ARTHROPLASTY ANTERIOR APPROACH;  Surgeon: Kennedy Bucker, MD;  Location: [facility] ORS;  Service: Orthopedics;  Laterality: Left;    Allergies: Allergies as of 02/09/2024 - Review Complete 01/29/2024  Allergen Reaction Noted   Amlodipine Other (See Comments) 08/16/2020   Statins Rash 12/18/2017    Medications: Outpatient Encounter Medications as of 02/09/2024  Medication Sig   acetaminophen (TYLENOL) 500 MG tablet Take 500 mg by mouth every 6 (six) hours as needed.   albuterol (PROVENTIL HFA;VENTOLIN HFA) 108 (90 Base) MCG/ACT inhaler Inhale 2 puffs into the lungs every 6 (six) hours as needed for wheezing or shortness of breath.   Ascorbic Acid (VITAMIN C) 1000 MG tablet Take 2 tablets by mouth daily.   CALCIUM CITRATE PO Take 1,000 mg by mouth daily.   carvedilol (COREG) 25 MG tablet TAKE 1 TABLET BY MOUTH TWICE DAILY   cholecalciferol (VITAMIN D) 1000 units tablet Take 2,000 Units by mouth daily. (Patient not taking: Reported on 10/16/2023)   Cholecalciferol (VITAMIN D-3) 125 MCG (5000 UT) TABS Take 2 tablets by mouth daily.   empagliflozin (JARDIANCE) 10 MG TABS tablet Take 1 tablet (10 mg total) by mouth daily before breakfast.   ezetimibe (ZETIA) 10 MG tablet TAKE 1 TABLET BY MOUTH EVERY DAY   Fluticasone-Salmeterol (ADVAIR) 250-50 MCG/DOSE AEPB Inhale 1 puff into the lungs 2 (two) times daily.    furosemide (LASIX) 20 MG tablet    hydrALAZINE (APRESOLINE) 25 MG tablet Take 1 tablet (25 mg total) by mouth 2 times daily at 12 noon and 4 pm.   letrozole (FEMARA) 2.5 MG tablet    loratadine (CLARITIN) 10  MG tablet Take 10 mg by mouth daily as needed for allergies.    losartan (COZAAR) 25 MG tablet    magnesium oxide (MAG-OX) 400 MG tablet Take 2 tablets by mouth daily.   pantoprazole (PROTONIX) 40 MG tablet Take 40 mg by mouth daily.   pregabalin (LYRICA) 50 MG capsule TAKE 1 CAPSULE BY MOUTH TWICE DAILY   solifenacin (VESICARE) 10 MG tablet Take 1 tablet (10 mg total) by mouth every evening.   [DISCONTINUED] prochlorperazine (COMPAZINE) 10 MG tablet Take 1 tablet (10 mg total) by mouth every 6 (six) hours as needed (Nausea or vomiting).   No facility-administered encounter medications on file as of 02/09/2024.    Social History: Social History   Tobacco Use   Smoking status: Never   Smokeless tobacco: Never  Vaping Use   Vaping status: Never Used  Substance Use Topics   Alcohol use: No   Drug use: No    Family  Medical History: Family History  Problem Relation Age of Onset   Heart disease Mother    Hypertension Mother    Arrhythmia Mother    Heart attack Mother    Diabetes Mother    Hypertension Father    Parkinson's disease Father    Coronary artery disease Maternal Uncle    Heart attack Maternal Uncle    Diabetes Sister    Diabetes Brother    Heart disease Brother    Breast cancer Neg Hx     Physical Examination: There were no vitals filed for this visit.  General: Patient is well developed, well nourished, calm, collected, and in no apparent distress. Attention to examination is appropriate.  Respiratory: Patient is breathing without any difficulty.   NEUROLOGICAL:     Awake, alert, oriented to person, place, and time.  Speech is clear and fluent. Fund of knowledge is appropriate.   Cranial Nerves: Pupils equal round and reactive to light.  Facial tone is symmetric.    *** ROM of cervical spine *** pain *** posterior cervical tenderness. *** tenderness in bilateral trapezial region.   *** ROM of lumbar spine *** pain *** posterior lumbar tenderness.    No abnormal lesions on exposed skin.   Strength: Side Biceps Triceps Deltoid Interossei Grip Wrist Ext. Wrist Flex.  R 5 5 5 5 5 5 5   L 5 5 5 5 5 5 5    Side Iliopsoas Quads Hamstring PF DF EHL  R 5 5 5 5 5 5   L 5 5 5 5 5 5    Reflexes are ***2+ and symmetric at the biceps, brachioradialis, patella and achilles.   Hoffman's is absent.  Clonus is not present.   Bilateral upper and lower extremity sensation is intact to light touch.     Gait is normal.   ***No difficulty with tandem gait.    Medical Decision Making  Imaging: CT scan of cervical spine dated 02/08/24:  FINDINGS: Alignment: Straightening of the normal cervical lordosis. No listhesis. No facet subluxation or dislocation.   Skull base and vertebrae: Lucency through the left posterolateral aspect of the C1 ring concerning for fracture. There is likely an additional lucency through the right posterolateral aspect of the C1 ring. No significant displacement noted. No additional displaced fracture or compression fracture noted in the cervical spine. There are prominent confluent anterior endplate osteophytes in the cervical spine extending to the upper thoracic spine. Possible discontinuity of osteophytes along the left anterolateral aspect at C5-6 which appears chronic.   Soft tissues and spinal canal: No prevertebral fluid or swelling. No visible canal hematoma.   Disc levels: Moderate disc space narrowing at multiple levels. Disc osteophyte complexes throughout the cervical spine most pronounced at C4-5 where there is mild spinal canal stenosis. Facet arthrosis and uncovertebral hypertrophy at multiple levels.   Upper chest: Negative.   Other: None.   IMPRESSION: Nondisplaced fractures involving the posterolateral aspects of the C1 ring. Recommend MRI for further evaluation of fractures and possible ligamentous injury.   No compression fracture in the cervical spine.   Degenerative changes at multiple  levels as above.   Prominent confluence endplate osteophytes suggestive of DISH.   Electronically Signed: By: Emily Filbert M.D. On: 02/08/2024 12:17  I have personally reviewed the images and agree with the above interpretation.  Assessment and Plan: Ms. Stemmer is a pleasant 68 y.o. female has ***  Treatment options discussed with patient and following plan made:   - Order for physical therapy  for *** spine ***. Patient to call to schedule appointment. *** - Continue current medications including ***. Reviewed dosing and side effects.  - Prescription for ***. Reviewed dosing and side effects. Take with food.  - Prescription for *** to take prn muscle spasms. Reviewed dosing and side effects. Discussed this can cause drowsiness.  - MRI of *** to further evaluate *** radiculopathy. No improvement time or medications (***).  - Referral to PMR at [facility] to discuss possible *** injections.  - Will schedule phone visit to review MRI results once I get them back.   I spent a total of *** minutes in face-to-face and non-face-to-face activities related to this patient's care today including review of outside records, review of imaging, review of symptoms, physical exam, discussion of differential diagnosis, discussion of treatment options, and documentation.   Thank you for involving me in the care of this patient.   Drake Leach PA-C Dept. of Neurosurgery

## 2024-02-09 ENCOUNTER — Other Ambulatory Visit: Payer: Self-pay | Admitting: Internal Medicine

## 2024-02-09 ENCOUNTER — Encounter: Payer: Self-pay | Admitting: Orthopedic Surgery

## 2024-02-09 ENCOUNTER — Ambulatory Visit (INDEPENDENT_AMBULATORY_CARE_PROVIDER_SITE_OTHER): Admitting: Orthopedic Surgery

## 2024-02-09 VITALS — BP 136/82 | Ht 62.0 in | Wt 222.0 lb

## 2024-02-09 DIAGNOSIS — S12001S Unspecified nondisplaced fracture of first cervical vertebra, sequela: Secondary | ICD-10-CM

## 2024-02-09 DIAGNOSIS — R202 Paresthesia of skin: Secondary | ICD-10-CM

## 2024-02-09 DIAGNOSIS — I10 Essential (primary) hypertension: Secondary | ICD-10-CM

## 2024-02-09 DIAGNOSIS — R200 Anesthesia of skin: Secondary | ICD-10-CM | POA: Diagnosis not present

## 2024-02-09 DIAGNOSIS — M47812 Spondylosis without myelopathy or radiculopathy, cervical region: Secondary | ICD-10-CM | POA: Diagnosis not present

## 2024-02-09 NOTE — Patient Instructions (Addendum)
 It was so nice to see you today. Thank you so much for coming in.    Broken bone in your neck seen on the CT scan is likely old. You have no restrictions. Do not need a collar.   Will send Waldo Laine a message regarding the MRI of your neck that she ordered. Will let her know you want the WIDE BORE MRI at [facility] and that you will need valium.   Once you have the MRI, please let me know so I can look at it. Will plan to set up phone visit to review the results.   Your blood pressure was slightly elevated today, but recheck was better. I want you to recheck it at home and follow up with your PCP if it remains high. If you have any chest pain, shortness of breath, blurry vision, or headaches then you need to go to ED.    Please do not hesitate to call if you have any questions or concerns. You can also message me in MyChart.   Drake Leach PA-C [phone]     The physicians and staff at [facility] are committed to providing excellent care. You may receive a survey asking for feedback about your experience at our office. We value you your feedback and appreciate you taking the time to to fill it out. The [facility] leadership team is also available to discuss your experience in person, feel free to contact us [phone].

## 2024-02-10 DIAGNOSIS — N1832 Chronic kidney disease, stage 3b: Secondary | ICD-10-CM | POA: Diagnosis not present

## 2024-02-10 DIAGNOSIS — I10 Essential (primary) hypertension: Secondary | ICD-10-CM | POA: Diagnosis not present

## 2024-02-16 ENCOUNTER — Other Ambulatory Visit: Payer: Self-pay | Admitting: Student

## 2024-02-16 DIAGNOSIS — G629 Polyneuropathy, unspecified: Secondary | ICD-10-CM

## 2024-02-16 DIAGNOSIS — R200 Anesthesia of skin: Secondary | ICD-10-CM

## 2024-02-16 DIAGNOSIS — R29898 Other symptoms and signs involving the musculoskeletal system: Secondary | ICD-10-CM

## 2024-02-17 DIAGNOSIS — D631 Anemia in chronic kidney disease: Secondary | ICD-10-CM | POA: Diagnosis not present

## 2024-02-17 DIAGNOSIS — N1832 Chronic kidney disease, stage 3b: Secondary | ICD-10-CM | POA: Diagnosis not present

## 2024-02-17 DIAGNOSIS — I10 Essential (primary) hypertension: Secondary | ICD-10-CM | POA: Diagnosis not present

## 2024-02-17 DIAGNOSIS — Z01812 Encounter for preprocedural laboratory examination: Secondary | ICD-10-CM | POA: Diagnosis not present

## 2024-02-17 DIAGNOSIS — N2581 Secondary hyperparathyroidism of renal origin: Secondary | ICD-10-CM | POA: Diagnosis not present

## 2024-02-22 DIAGNOSIS — Z0189 Encounter for other specified special examinations: Secondary | ICD-10-CM | POA: Diagnosis not present

## 2024-02-22 DIAGNOSIS — E119 Type 2 diabetes mellitus without complications: Secondary | ICD-10-CM | POA: Diagnosis not present

## 2024-02-22 DIAGNOSIS — I10 Essential (primary) hypertension: Secondary | ICD-10-CM | POA: Diagnosis not present

## 2024-02-22 DIAGNOSIS — H2513 Age-related nuclear cataract, bilateral: Secondary | ICD-10-CM | POA: Diagnosis not present

## 2024-02-22 DIAGNOSIS — M1712 Unilateral primary osteoarthritis, left knee: Secondary | ICD-10-CM | POA: Diagnosis not present

## 2024-02-25 DIAGNOSIS — Z96651 Presence of right artificial knee joint: Secondary | ICD-10-CM | POA: Diagnosis not present

## 2024-02-25 DIAGNOSIS — E1122 Type 2 diabetes mellitus with diabetic chronic kidney disease: Secondary | ICD-10-CM | POA: Diagnosis not present

## 2024-02-25 DIAGNOSIS — M25762 Osteophyte, left knee: Secondary | ICD-10-CM | POA: Diagnosis not present

## 2024-02-25 DIAGNOSIS — E785 Hyperlipidemia, unspecified: Secondary | ICD-10-CM | POA: Diagnosis not present

## 2024-02-25 DIAGNOSIS — I129 Hypertensive chronic kidney disease with stage 1 through stage 4 chronic kidney disease, or unspecified chronic kidney disease: Secondary | ICD-10-CM | POA: Diagnosis not present

## 2024-02-25 DIAGNOSIS — M21962 Unspecified acquired deformity of left lower leg: Secondary | ICD-10-CM | POA: Diagnosis not present

## 2024-02-25 DIAGNOSIS — G8918 Other acute postprocedural pain: Secondary | ICD-10-CM | POA: Diagnosis not present

## 2024-02-25 DIAGNOSIS — M1712 Unilateral primary osteoarthritis, left knee: Secondary | ICD-10-CM | POA: Diagnosis not present

## 2024-02-25 DIAGNOSIS — Z7982 Long term (current) use of aspirin: Secondary | ICD-10-CM | POA: Diagnosis not present

## 2024-02-25 DIAGNOSIS — N1832 Chronic kidney disease, stage 3b: Secondary | ICD-10-CM | POA: Diagnosis not present

## 2024-02-26 DIAGNOSIS — M1712 Unilateral primary osteoarthritis, left knee: Secondary | ICD-10-CM | POA: Diagnosis not present

## 2024-02-26 DIAGNOSIS — Z96651 Presence of right artificial knee joint: Secondary | ICD-10-CM | POA: Diagnosis not present

## 2024-02-26 DIAGNOSIS — E785 Hyperlipidemia, unspecified: Secondary | ICD-10-CM | POA: Diagnosis not present

## 2024-02-26 DIAGNOSIS — I129 Hypertensive chronic kidney disease with stage 1 through stage 4 chronic kidney disease, or unspecified chronic kidney disease: Secondary | ICD-10-CM | POA: Diagnosis not present

## 2024-02-26 DIAGNOSIS — Z7982 Long term (current) use of aspirin: Secondary | ICD-10-CM | POA: Diagnosis not present

## 2024-02-26 DIAGNOSIS — M21962 Unspecified acquired deformity of left lower leg: Secondary | ICD-10-CM | POA: Diagnosis not present

## 2024-02-26 DIAGNOSIS — E1122 Type 2 diabetes mellitus with diabetic chronic kidney disease: Secondary | ICD-10-CM | POA: Diagnosis not present

## 2024-02-26 DIAGNOSIS — N1832 Chronic kidney disease, stage 3b: Secondary | ICD-10-CM | POA: Diagnosis not present

## 2024-02-26 DIAGNOSIS — M25762 Osteophyte, left knee: Secondary | ICD-10-CM | POA: Diagnosis not present

## 2024-03-01 ENCOUNTER — Other Ambulatory Visit: Payer: Self-pay

## 2024-03-03 DIAGNOSIS — M1712 Unilateral primary osteoarthritis, left knee: Secondary | ICD-10-CM | POA: Diagnosis not present

## 2024-03-03 DIAGNOSIS — R2242 Localized swelling, mass and lump, left lower limb: Secondary | ICD-10-CM | POA: Diagnosis not present

## 2024-03-03 MED ORDER — FLUTICASONE-SALMETEROL 250-50 MCG/ACT IN AEPB: 1.0000 | INHALATION_SPRAY | Freq: Two times a day (BID) | RESPIRATORY_TRACT | 2 refills | Status: DC

## 2024-03-07 DIAGNOSIS — R2242 Localized swelling, mass and lump, left lower limb: Secondary | ICD-10-CM | POA: Diagnosis not present

## 2024-03-07 DIAGNOSIS — M1712 Unilateral primary osteoarthritis, left knee: Secondary | ICD-10-CM | POA: Diagnosis not present

## 2024-03-09 ENCOUNTER — Ambulatory Visit: Admitting: Urology

## 2024-03-10 DIAGNOSIS — M1712 Unilateral primary osteoarthritis, left knee: Secondary | ICD-10-CM | POA: Diagnosis not present

## 2024-03-10 DIAGNOSIS — R2242 Localized swelling, mass and lump, left lower limb: Secondary | ICD-10-CM | POA: Diagnosis not present

## 2024-03-10 DIAGNOSIS — N1832 Chronic kidney disease, stage 3b: Secondary | ICD-10-CM | POA: Diagnosis not present

## 2024-03-15 DIAGNOSIS — M1712 Unilateral primary osteoarthritis, left knee: Secondary | ICD-10-CM | POA: Diagnosis not present

## 2024-03-15 DIAGNOSIS — R2242 Localized swelling, mass and lump, left lower limb: Secondary | ICD-10-CM | POA: Diagnosis not present

## 2024-03-17 DIAGNOSIS — R2242 Localized swelling, mass and lump, left lower limb: Secondary | ICD-10-CM | POA: Diagnosis not present

## 2024-03-17 DIAGNOSIS — M1712 Unilateral primary osteoarthritis, left knee: Secondary | ICD-10-CM | POA: Diagnosis not present

## 2024-03-19 DIAGNOSIS — L600 Ingrowing nail: Secondary | ICD-10-CM | POA: Diagnosis not present

## 2024-03-21 DIAGNOSIS — M1712 Unilateral primary osteoarthritis, left knee: Secondary | ICD-10-CM | POA: Diagnosis not present

## 2024-03-21 DIAGNOSIS — R2242 Localized swelling, mass and lump, left lower limb: Secondary | ICD-10-CM | POA: Diagnosis not present

## 2024-03-24 DIAGNOSIS — M1712 Unilateral primary osteoarthritis, left knee: Secondary | ICD-10-CM | POA: Diagnosis not present

## 2024-03-24 DIAGNOSIS — R2242 Localized swelling, mass and lump, left lower limb: Secondary | ICD-10-CM | POA: Diagnosis not present

## 2024-03-28 DIAGNOSIS — L600 Ingrowing nail: Secondary | ICD-10-CM | POA: Diagnosis not present

## 2024-04-04 DIAGNOSIS — R002 Palpitations: Secondary | ICD-10-CM | POA: Diagnosis not present

## 2024-04-04 DIAGNOSIS — E7800 Pure hypercholesterolemia, unspecified: Secondary | ICD-10-CM | POA: Diagnosis not present

## 2024-04-04 DIAGNOSIS — I10 Essential (primary) hypertension: Secondary | ICD-10-CM | POA: Diagnosis not present

## 2024-04-05 DIAGNOSIS — M1712 Unilateral primary osteoarthritis, left knee: Secondary | ICD-10-CM | POA: Diagnosis not present

## 2024-04-05 DIAGNOSIS — R2242 Localized swelling, mass and lump, left lower limb: Secondary | ICD-10-CM | POA: Diagnosis not present

## 2024-04-06 DIAGNOSIS — L218 Other seborrheic dermatitis: Secondary | ICD-10-CM | POA: Diagnosis not present

## 2024-04-07 DIAGNOSIS — Z961 Presence of intraocular lens: Secondary | ICD-10-CM | POA: Diagnosis not present

## 2024-04-07 DIAGNOSIS — H2511 Age-related nuclear cataract, right eye: Secondary | ICD-10-CM | POA: Diagnosis not present

## 2024-04-08 DIAGNOSIS — H25012 Cortical age-related cataract, left eye: Secondary | ICD-10-CM | POA: Diagnosis not present

## 2024-04-08 DIAGNOSIS — Z96652 Presence of left artificial knee joint: Secondary | ICD-10-CM | POA: Diagnosis not present

## 2024-04-08 DIAGNOSIS — H25042 Posterior subcapsular polar age-related cataract, left eye: Secondary | ICD-10-CM | POA: Diagnosis not present

## 2024-04-08 DIAGNOSIS — H2512 Age-related nuclear cataract, left eye: Secondary | ICD-10-CM | POA: Diagnosis not present

## 2024-04-11 DIAGNOSIS — R2242 Localized swelling, mass and lump, left lower limb: Secondary | ICD-10-CM | POA: Diagnosis not present

## 2024-04-11 DIAGNOSIS — M1712 Unilateral primary osteoarthritis, left knee: Secondary | ICD-10-CM | POA: Diagnosis not present

## 2024-04-13 DIAGNOSIS — R2242 Localized swelling, mass and lump, left lower limb: Secondary | ICD-10-CM | POA: Diagnosis not present

## 2024-04-13 DIAGNOSIS — M1712 Unilateral primary osteoarthritis, left knee: Secondary | ICD-10-CM | POA: Diagnosis not present

## 2024-04-14 ENCOUNTER — Ambulatory Visit: Admitting: Urology

## 2024-04-19 DIAGNOSIS — R002 Palpitations: Secondary | ICD-10-CM | POA: Diagnosis not present

## 2024-04-20 DIAGNOSIS — M1712 Unilateral primary osteoarthritis, left knee: Secondary | ICD-10-CM | POA: Diagnosis not present

## 2024-04-20 DIAGNOSIS — R2242 Localized swelling, mass and lump, left lower limb: Secondary | ICD-10-CM | POA: Diagnosis not present

## 2024-04-21 ENCOUNTER — Other Ambulatory Visit: Payer: Self-pay

## 2024-04-21 DIAGNOSIS — M47812 Spondylosis without myelopathy or radiculopathy, cervical region: Secondary | ICD-10-CM

## 2024-04-21 DIAGNOSIS — R200 Anesthesia of skin: Secondary | ICD-10-CM | POA: Diagnosis not present

## 2024-04-21 DIAGNOSIS — Z1331 Encounter for screening for depression: Secondary | ICD-10-CM | POA: Diagnosis not present

## 2024-04-26 ENCOUNTER — Other Ambulatory Visit: Payer: Self-pay

## 2024-04-26 DIAGNOSIS — E7800 Pure hypercholesterolemia, unspecified: Secondary | ICD-10-CM | POA: Diagnosis not present

## 2024-04-26 DIAGNOSIS — M47812 Spondylosis without myelopathy or radiculopathy, cervical region: Secondary | ICD-10-CM

## 2024-04-26 DIAGNOSIS — M1712 Unilateral primary osteoarthritis, left knee: Secondary | ICD-10-CM | POA: Diagnosis not present

## 2024-04-26 DIAGNOSIS — I10 Essential (primary) hypertension: Secondary | ICD-10-CM | POA: Diagnosis not present

## 2024-04-26 DIAGNOSIS — R2242 Localized swelling, mass and lump, left lower limb: Secondary | ICD-10-CM | POA: Diagnosis not present

## 2024-05-02 DIAGNOSIS — M1712 Unilateral primary osteoarthritis, left knee: Secondary | ICD-10-CM | POA: Diagnosis not present

## 2024-05-02 DIAGNOSIS — R2242 Localized swelling, mass and lump, left lower limb: Secondary | ICD-10-CM | POA: Diagnosis not present

## 2024-05-03 ENCOUNTER — Inpatient Hospital Stay: Admission: RE | Admit: 2024-05-03 | Source: Ambulatory Visit

## 2024-05-04 DIAGNOSIS — R2242 Localized swelling, mass and lump, left lower limb: Secondary | ICD-10-CM | POA: Diagnosis not present

## 2024-05-04 DIAGNOSIS — M1712 Unilateral primary osteoarthritis, left knee: Secondary | ICD-10-CM | POA: Diagnosis not present

## 2024-05-10 ENCOUNTER — Other Ambulatory Visit

## 2024-05-11 DIAGNOSIS — M1712 Unilateral primary osteoarthritis, left knee: Secondary | ICD-10-CM | POA: Diagnosis not present

## 2024-05-11 DIAGNOSIS — R2242 Localized swelling, mass and lump, left lower limb: Secondary | ICD-10-CM | POA: Diagnosis not present

## 2024-05-12 ENCOUNTER — Ambulatory Visit: Admitting: Urology

## 2024-05-12 VITALS — BP 145/75 | HR 79 | Ht 62.0 in | Wt 220.0 lb

## 2024-05-12 DIAGNOSIS — R319 Hematuria, unspecified: Secondary | ICD-10-CM | POA: Diagnosis not present

## 2024-05-12 DIAGNOSIS — N939 Abnormal uterine and vaginal bleeding, unspecified: Secondary | ICD-10-CM

## 2024-05-12 DIAGNOSIS — R3129 Other microscopic hematuria: Secondary | ICD-10-CM | POA: Diagnosis not present

## 2024-05-12 LAB — MICROSCOPIC EXAMINATION: Bacteria, UA: NONE SEEN

## 2024-05-12 LAB — URINALYSIS, COMPLETE
Bilirubin, UA: NEGATIVE
Glucose, UA: NEGATIVE
Ketones, UA: NEGATIVE
Leukocytes,UA: NEGATIVE
Nitrite, UA: NEGATIVE
Protein,UA: NEGATIVE
RBC, UA: NEGATIVE
Specific Gravity, UA: 1.015 (ref 1.005–1.030)
Urobilinogen, Ur: 0.2 mg/dL (ref 0.2–1.0)
pH, UA: 6 (ref 5.0–7.5)

## 2024-05-12 NOTE — Patient Instructions (Signed)

## 2024-05-12 NOTE — Progress Notes (Incomplete)
 I, Christina Mcclain, acting as a scribe for Christina Knapp, MD., have documented all relevant documentation on the behalf of Christina Knapp, MD, as directed by Christina Knapp, MD while in the presence of Christina Knapp, MD.  05/12/2024 2:53 PM   Christina Mcclain [birth year] [id number]  Referring provider: [name] [name], MD [address] [facility] [place],  [place] [address]  Chief Complaint  Patient presents with   Other    HPI: Christina Mcclain is a 68 y.o. female referred for evaluation of hematuria.  Saw Dr Christina Mcclain February 2025 complaining of a severe infection Reports blood on her pad and occasionally with wiping. Denies gross hematuria. Np bothersome lower urinary tract symptoms.  Urinalysis early March 2025 showed 2+ blood on dipstick, however no significant RBCs on microscopy.  Denies flank, abdominal, or pelvic pain, She does have dyspareunia A renal ultrasound in 2023 showed no renal masses, calculi. She did have simple renal cysts.   PMH: Past Medical History:  Diagnosis Date   Adverse effect of anesthesia    hard to awaken   Anemia    Arthritis    Asthma    Asthma without status asthmaticus    unspecified; Take advair  each night   Breast cancer (HCC) 2021   right breast IMC   Bronchitis    Chronic kidney disease    stage III per dr Christina Mcclain   Constipation    Diabetes (HCC) 2010   Diabetes mellitus type 2, uncomplicated (HCC)    Diabetes mellitus without complication (HCC)    Encounter for blood transfusion    hysterectomy 9-10 years ago   Heart murmur    unspecified   Hyperlipidemia    Hypertension 1991   Hypothyroid    Multiple thyroid  nodules    Neuromuscular disorder (HCC)    neuropathy   Osteoarthritis of right hip 02/04/2017   Palpitations    Pernicious anemia    Personal history of chemotherapy 2020-2021   Right breast ca   Personal history of radiation therapy 2021   right breast [facility]    Pneumonia    Sleep apnea    Use C-PAP; can't use mouth piece. Anxiety   Thyroid  disease    nodules   Thyroid  nodule    x2   Urinary urgency     Surgical History: Past Surgical History:  Procedure Laterality Date   ABDOMINAL HYSTERECTOMY  2011   partial   ABDOMINAL SURGERY     BREAST BIOPSY Right 10/02/2020   affirm bx, x marker, fat necrosis   BREAST BIOPSY Right 10/19/2019   two masses at 9:00 us  bx, coil marker and venus marker, IMC for both   BREAST LUMPECTOMY Right 03/28/2020   bracketing of two IMC masses at 9:00.  Venus marker was pushed medially during wire loc. Remains in breast.  Clear surgical margins. Negative Lymph node   CARPAL TUNNEL RELEASE Left 2008   CARPAL TUNNEL RELEASE Right    CHOLECYSTECTOMY  1982   COLONOSCOPY WITH PROPOFOL  N/A 10/05/2017   Procedure: COLONOSCOPY WITH PROPOFOL ;  Surgeon: Christina Click, MD;  Location: [facility] ENDOSCOPY;  Service: Endoscopy;  Laterality: N/A;   COLONOSCOPY WITH PROPOFOL  N/A 08/09/2021   Procedure: COLONOSCOPY WITH PROPOFOL ;  Surgeon: Christina Buckle, DO;  Location: [facility] ENDOSCOPY;  Service: Gastroenterology;  Laterality: N/A;   DIAGNOSTIC LAPAROSCOPY     ESOPHAGOGASTRODUODENOSCOPY (EGD) WITH PROPOFOL  N/A 08/09/2021   Procedure: ESOPHAGOGASTRODUODENOSCOPY (EGD) WITH PROPOFOL ;  Surgeon: Christina Brisk  Bambi Lever, DO;  Location: [facility] ENDOSCOPY;  Service: Gastroenterology;  Laterality: N/A;   JOINT REPLACEMENT Right 02/09/2017   TOTAL HIP REPLACEMENT, DR. CARTER. [place]    KNEE ARTHROSCOPY  1990s   PARTIAL MASTECTOMY WITH NEEDLE LOCALIZATION AND AXILLARY SENTINEL LYMPH NODE BX Right 03/28/2020   Procedure: PARTIAL MASTECTOMY WITH NEEDLE LOCALIZATION AND AXILLARY SENTINEL LYMPH NODE BX;  Surgeon: Christina Grego, MD;  Location: [facility] ORS;  Service: General;  Laterality: Right;   PORTACATH PLACEMENT Left 11/07/2019   Procedure: INSERTION PORT-A-CATH;  Surgeon: Christina Grego, MD;  Location: [facility] ORS;  Service: General;   Laterality: Left;   TOTAL HIP ARTHROPLASTY Left 06/23/2017   Procedure: TOTAL HIP ARTHROPLASTY ANTERIOR APPROACH;  Surgeon: Christina Angelucci, MD;  Location: [facility] ORS;  Service: Orthopedics;  Laterality: Left;    Home Medications:  Allergies as of 05/12/2024       Reactions   Amlodipine Other (See Comments)   edema   Statins Rash   Other reaction(s): Muscle Pain Possible hives also   Sulfamethoxazole Rash        Medication List        Accurate as of May 12, 2024  2:53 PM. If you have any questions, ask your nurse or doctor.          acetaminophen  500 MG tablet Commonly known as: TYLENOL  Take 500 mg by mouth every 6 (six) hours as needed.   albuterol  108 (90 Base) MCG/ACT inhaler Commonly known as: VENTOLIN  HFA Inhale 2 puffs into the lungs every 6 (six) hours as needed for wheezing or shortness of breath.   aspirin  EC 81 MG tablet Take 1 tablet twice a day by oral route.   CALCIUM  CITRATE PO Take 1,000 mg by mouth daily.   carvedilol  25 MG tablet Commonly known as: COREG  TAKE 1 TABLET BY MOUTH TWICE DAILY   cholecalciferol  1000 units tablet Commonly known as: VITAMIN D  Take 2,000 Units by mouth daily.   Colace 100 MG capsule Generic drug: docusate sodium  Take 1 capsule twice a day by oral route.   empagliflozin  10 MG Tabs tablet Commonly known as: Jardiance  Take 1 tablet (10 mg total) by mouth daily before breakfast.   ezetimibe  10 MG tablet Commonly known as: ZETIA  TAKE 1 TABLET BY MOUTH EVERY DAY   fluconazole 200 MG tablet Commonly known as: DIFLUCAN Take 1 tablet by mouth once a week.   Fluticasone -Salmeterol 250-50 MCG/DOSE Aepb Commonly known as: ADVAIR  Inhale 1 puff into the lungs 2 (two) times daily.   fluticasone -salmeterol 250-50 MCG/ACT Aepb Commonly known as: ADVAIR  Inhale 1 puff into the lungs in the morning and at bedtime.   furosemide 20 MG tablet Commonly known as: LASIX   hydrALAZINE  25 MG tablet Commonly known as:  APRESOLINE  Take 1 tablet (25 mg total) by mouth 2 times daily at 12 noon and 4 pm.   letrozole  2.5 MG tablet Commonly known as: FEMARA    loratadine  10 MG tablet Commonly known as: CLARITIN  Take 10 mg by mouth daily as needed for allergies.   losartan 25 MG tablet Commonly known as: COZAAR   magnesium  oxide 400 MG tablet Commonly known as: MAG-OX Take 2 tablets by mouth daily.   methocarbamol  500 MG tablet Commonly known as: ROBAXIN  Take 1 tablet every 8 hours by oral route.   oxyCODONE  5 MG immediate release tablet Commonly known as: Oxy IR/ROXICODONE  Take 1 tablet every 4 hours by oral route.   pantoprazole  40 MG tablet Commonly known as: PROTONIX  Take 40 mg by mouth  daily.   pregabalin 50 MG capsule Commonly known as: LYRICA TAKE 1 CAPSULE BY MOUTH TWICE DAILY   solifenacin  10 MG tablet Commonly known as: VESICARE  Take 1 tablet (10 mg total) by mouth every evening.   vitamin C  1000 MG tablet Take 2 tablets by mouth daily.        Allergies:  Allergies  Allergen Reactions   Amlodipine Other (See Comments)    edema   Statins Rash    Other reaction(s): Muscle Pain Possible hives also   Sulfamethoxazole Rash    Family History: Family History  Problem Relation Age of Onset   Heart disease Mother    Hypertension Mother    Arrhythmia Mother    Heart attack Mother    Diabetes Mother    Hypertension Father    Parkinson's disease Father    Coronary artery disease Maternal Uncle    Heart attack Maternal Uncle    Diabetes Sister    Diabetes Brother    Heart disease Brother    Breast cancer Neg Hx     Social History:  reports that she has never smoked. She has never used smokeless tobacco. She reports that she does not drink alcohol and does not use drugs.   Physical Exam: BP (!) 145/75   Pulse 79   Ht 5' 2 (1.575 m)   Wt 220 lb (99.8 kg)   BMI 40.24 kg/m   Constitutional:  Alert and oriented, No acute distress. HEENT: [place] AT, moist mucus  membranes.  Trachea midline, no masses. Cardiovascular: No clubbing, cyanosis, or edema. Respiratory: Normal respiratory effort, no increased work of breathing. GI: Abdomen is soft, nontender, nondistended, no abdominal masses Skin: No rashes, bruises or suspicious lesions. Neurologic: Grossly intact, no focal deficits, moving all 4 extremities. Psychiatric: Normal mood and affect.   Urinalysis Dipstick/microscopy negative   Pertinent Imaging: Ultrasound was personally reviewed and interpreted.   US  RENAL  Narrative CLINICAL DATA:  68 year old female chronic kidney disease, stage IIIB. Acute kidney injury.  EXAM: RENAL / URINARY TRACT ULTRASOUND COMPLETE  COMPARISON:  09/25/2020 ultrasound  FINDINGS: Right Kidney:  Renal measurements: 10 x 4.4 x 4.1 cm = volume: 94 mL. Increased renal echogenicity and cortical thinning again noted. A 1.8 cm cyst in the LOWER pole is identified. No solid mass or hydronephrosis.  Left Kidney:  Renal measurements: 9.2 x 5.1 x 4.7 cm = volume: 117 mL. Increased renal echogenicity and cortical thinning again noted. No solid mass or hydronephrosis identified.  Bladder:  Appears normal for degree of bladder distention.  Other:  None.  IMPRESSION: 1. Increased renal echogenicity and cortical thinning again noted compatible with chronic medical renal disease. 2. No evidence of solid mass or hydronephrosis.   Electronically Signed By: Sundra Engel M.D. On: 06/12/2022 07:36   Assessment & Plan:    1. Vaginal bleeding Blood noted on pad and intermittent spotting. Schedule cystoscopy to evaluate for urethral/bladder abnormalities.  2. Dipstick positive urine Negative microscopic examination UA today negative Based on American Urological Association practice guidelines asymptomatic microhematuria [facility]) is defined as three or greater red blood cells per high powered field on a properly collected urinary specimen in the absence of  an obvious benign cause. A positive dipstick does not define AMH, and evaluation should be based solely on findings from microscopic examination of urinary sediment and not on a dipstick reading.  [facility] [address] [place], [place] [address] [phone]

## 2024-05-16 ENCOUNTER — Encounter: Payer: Self-pay | Admitting: Urology

## 2024-05-16 DIAGNOSIS — R2242 Localized swelling, mass and lump, left lower limb: Secondary | ICD-10-CM | POA: Diagnosis not present

## 2024-05-16 DIAGNOSIS — M1712 Unilateral primary osteoarthritis, left knee: Secondary | ICD-10-CM | POA: Diagnosis not present

## 2024-05-23 DIAGNOSIS — R2242 Localized swelling, mass and lump, left lower limb: Secondary | ICD-10-CM | POA: Diagnosis not present

## 2024-05-23 DIAGNOSIS — M1712 Unilateral primary osteoarthritis, left knee: Secondary | ICD-10-CM | POA: Diagnosis not present

## 2024-05-25 DIAGNOSIS — M1712 Unilateral primary osteoarthritis, left knee: Secondary | ICD-10-CM | POA: Diagnosis not present

## 2024-05-25 DIAGNOSIS — R2242 Localized swelling, mass and lump, left lower limb: Secondary | ICD-10-CM | POA: Diagnosis not present

## 2024-05-25 DIAGNOSIS — R200 Anesthesia of skin: Secondary | ICD-10-CM | POA: Diagnosis not present

## 2024-06-06 ENCOUNTER — Ambulatory Visit: Admitting: Internal Medicine

## 2024-06-09 DIAGNOSIS — G5602 Carpal tunnel syndrome, left upper limb: Secondary | ICD-10-CM | POA: Diagnosis not present

## 2024-06-16 ENCOUNTER — Encounter: Payer: Self-pay | Admitting: Urology

## 2024-06-16 ENCOUNTER — Ambulatory Visit (INDEPENDENT_AMBULATORY_CARE_PROVIDER_SITE_OTHER): Admitting: Urology

## 2024-06-16 VITALS — BP 135/80 | HR 76 | Ht 62.0 in | Wt 220.0 lb

## 2024-06-16 DIAGNOSIS — R319 Hematuria, unspecified: Secondary | ICD-10-CM | POA: Diagnosis not present

## 2024-06-16 DIAGNOSIS — N939 Abnormal uterine and vaginal bleeding, unspecified: Secondary | ICD-10-CM

## 2024-06-16 LAB — URINALYSIS, COMPLETE
Bilirubin, UA: NEGATIVE
Glucose, UA: NEGATIVE
Ketones, UA: NEGATIVE
Nitrite, UA: NEGATIVE
Protein,UA: NEGATIVE
RBC, UA: NEGATIVE
Specific Gravity, UA: 1.01 (ref 1.005–1.030)
Urobilinogen, Ur: 0.2 mg/dL (ref 0.2–1.0)
pH, UA: 6.5 (ref 5.0–7.5)

## 2024-06-16 LAB — MICROSCOPIC EXAMINATION

## 2024-06-16 NOTE — Progress Notes (Signed)
   06/16/24  CC:  Chief Complaint  Patient presents with   Cysto    HPI: Refer to my previous note 05/12/2024.  Denies recurrent blood on pad or blood with wiping.  UA today 6-10 WBC  Blood pressure 135/80, pulse 76, height 5' 2 (1.575 m), weight 220 lb (99.8 kg). NED. A&Ox3.   No respiratory distress   Abd soft, NT, ND Normal external genitalia with patent urethral meatus  Cystoscopy Procedure Note  Patient identification was confirmed, informed consent was obtained, and patient was prepped using Betadine solution.  Lidocaine  jelly was administered per urethral meatus.    Procedure: - Flexible cystoscope introduced, without any difficulty.   - Thorough search of the bladder revealed:    normal urethral meatus    normal urothelium    no stones    no ulcers     no tumors    no urethral polyps    no trabeculation  - Ureteral orifices were normal in position and appearance.  Post-Procedure: - Patient tolerated the procedure well  Assessment/ Plan: No bladder mucosal or urethral abnormalities identified Follow-up as needed for recurrent bleeding    Glendia JAYSON Barba, MD

## 2024-06-22 DIAGNOSIS — R2242 Localized swelling, mass and lump, left lower limb: Secondary | ICD-10-CM | POA: Diagnosis not present

## 2024-06-22 DIAGNOSIS — M1712 Unilateral primary osteoarthritis, left knee: Secondary | ICD-10-CM | POA: Diagnosis not present

## 2024-07-08 DIAGNOSIS — H2512 Age-related nuclear cataract, left eye: Secondary | ICD-10-CM | POA: Diagnosis not present

## 2024-07-08 DIAGNOSIS — E119 Type 2 diabetes mellitus without complications: Secondary | ICD-10-CM | POA: Diagnosis not present

## 2024-07-08 DIAGNOSIS — I10 Essential (primary) hypertension: Secondary | ICD-10-CM | POA: Diagnosis not present

## 2024-07-08 DIAGNOSIS — Z961 Presence of intraocular lens: Secondary | ICD-10-CM | POA: Diagnosis not present

## 2024-07-11 DIAGNOSIS — I10 Essential (primary) hypertension: Secondary | ICD-10-CM | POA: Diagnosis not present

## 2024-07-11 DIAGNOSIS — D631 Anemia in chronic kidney disease: Secondary | ICD-10-CM | POA: Diagnosis not present

## 2024-07-11 DIAGNOSIS — N1832 Chronic kidney disease, stage 3b: Secondary | ICD-10-CM | POA: Diagnosis not present

## 2024-07-13 ENCOUNTER — Other Ambulatory Visit: Payer: Self-pay | Admitting: Cardiology

## 2024-07-13 DIAGNOSIS — I10 Essential (primary) hypertension: Secondary | ICD-10-CM | POA: Diagnosis not present

## 2024-07-13 DIAGNOSIS — D631 Anemia in chronic kidney disease: Secondary | ICD-10-CM | POA: Diagnosis not present

## 2024-07-13 DIAGNOSIS — N2581 Secondary hyperparathyroidism of renal origin: Secondary | ICD-10-CM | POA: Diagnosis not present

## 2024-07-13 DIAGNOSIS — N1832 Chronic kidney disease, stage 3b: Secondary | ICD-10-CM | POA: Diagnosis not present

## 2024-07-15 ENCOUNTER — Other Ambulatory Visit: Payer: Self-pay | Admitting: Internal Medicine

## 2024-07-15 ENCOUNTER — Other Ambulatory Visit

## 2024-07-15 DIAGNOSIS — E782 Mixed hyperlipidemia: Secondary | ICD-10-CM | POA: Diagnosis not present

## 2024-07-15 DIAGNOSIS — I10 Essential (primary) hypertension: Secondary | ICD-10-CM

## 2024-07-15 DIAGNOSIS — E118 Type 2 diabetes mellitus with unspecified complications: Secondary | ICD-10-CM | POA: Diagnosis not present

## 2024-07-16 LAB — CBC WITH DIFFERENTIAL/PLATELET
Basophils Absolute: 0.1 x10E3/uL (ref 0.0–0.2)
Basos: 2 %
EOS (ABSOLUTE): 0.1 x10E3/uL (ref 0.0–0.4)
Eos: 3 %
Hematocrit: 36.5 % (ref 34.0–46.6)
Hemoglobin: 10.8 g/dL — ABNORMAL LOW (ref 11.1–15.9)
Immature Grans (Abs): 0 x10E3/uL (ref 0.0–0.1)
Immature Granulocytes: 0 %
Lymphocytes Absolute: 1.6 x10E3/uL (ref 0.7–3.1)
Lymphs: 42 %
MCH: 24.1 pg — ABNORMAL LOW (ref 26.6–33.0)
MCHC: 29.6 g/dL — ABNORMAL LOW (ref 31.5–35.7)
MCV: 82 fL (ref 79–97)
Monocytes Absolute: 0.5 x10E3/uL (ref 0.1–0.9)
Monocytes: 14 %
Neutrophils Absolute: 1.5 x10E3/uL (ref 1.4–7.0)
Neutrophils: 39 %
Platelets: 165 x10E3/uL (ref 150–450)
RBC: 4.48 x10E6/uL (ref 3.77–5.28)
RDW: 15.9 % — ABNORMAL HIGH (ref 11.7–15.4)
WBC: 3.8 x10E3/uL (ref 3.4–10.8)

## 2024-07-16 LAB — LIPID PANEL W/O CHOL/HDL RATIO
Cholesterol, Total: 167 mg/dL (ref 100–199)
HDL: 69 mg/dL (ref >39)
LDL Chol Calc (NIH): 87 mg/dL (ref 0–99)
Triglycerides: 56 mg/dL (ref 0–149)
VLDL Cholesterol Cal: 11 mg/dL (ref 5–40)

## 2024-07-16 LAB — CMP14+EGFR
ALT: 16 IU/L (ref 0–32)
AST: 10 IU/L (ref 0–40)
Albumin: 4.3 g/dL (ref 3.9–4.9)
Alkaline Phosphatase: 130 IU/L — ABNORMAL HIGH (ref 44–121)
BUN/Creatinine Ratio: 18 (ref 12–28)
BUN: 26 mg/dL (ref 8–27)
Bilirubin Total: 0.2 mg/dL (ref 0.0–1.2)
CO2: 27 mmol/L (ref 20–29)
Calcium: 9.8 mg/dL (ref 8.7–10.3)
Chloride: 101 mmol/L (ref 96–106)
Creatinine, Ser: 1.47 mg/dL — ABNORMAL HIGH (ref 0.57–1.00)
Globulin, Total: 2.5 g/dL (ref 1.5–4.5)
Glucose: 93 mg/dL (ref 70–99)
Potassium: 4.8 mmol/L (ref 3.5–5.2)
Sodium: 142 mmol/L (ref 134–144)
Total Protein: 6.8 g/dL (ref 6.0–8.5)
eGFR: 38 mL/min/1.73 — ABNORMAL LOW (ref >59)

## 2024-07-16 LAB — HEMOGLOBIN A1C
Est. average glucose Bld gHb Est-mCnc: 163 mg/dL
Hgb A1c MFr Bld: 7.3 % — ABNORMAL HIGH (ref 4.8–5.6)

## 2024-07-18 ENCOUNTER — Ambulatory Visit: Admitting: Internal Medicine

## 2024-07-18 ENCOUNTER — Ambulatory Visit: Payer: Self-pay | Admitting: Internal Medicine

## 2024-07-18 DIAGNOSIS — R002 Palpitations: Secondary | ICD-10-CM | POA: Diagnosis not present

## 2024-07-21 ENCOUNTER — Encounter: Payer: Self-pay | Admitting: Internal Medicine

## 2024-07-21 ENCOUNTER — Ambulatory Visit (INDEPENDENT_AMBULATORY_CARE_PROVIDER_SITE_OTHER): Admitting: Internal Medicine

## 2024-07-21 VITALS — BP 130/84 | HR 72 | Ht 62.0 in | Wt 223.0 lb

## 2024-07-21 DIAGNOSIS — E1169 Type 2 diabetes mellitus with other specified complication: Secondary | ICD-10-CM

## 2024-07-21 DIAGNOSIS — N1832 Chronic kidney disease, stage 3b: Secondary | ICD-10-CM

## 2024-07-21 DIAGNOSIS — K219 Gastro-esophageal reflux disease without esophagitis: Secondary | ICD-10-CM | POA: Diagnosis not present

## 2024-07-21 DIAGNOSIS — I152 Hypertension secondary to endocrine disorders: Secondary | ICD-10-CM

## 2024-07-21 DIAGNOSIS — E1122 Type 2 diabetes mellitus with diabetic chronic kidney disease: Secondary | ICD-10-CM

## 2024-07-21 DIAGNOSIS — E1159 Type 2 diabetes mellitus with other circulatory complications: Secondary | ICD-10-CM | POA: Diagnosis not present

## 2024-07-21 DIAGNOSIS — I10 Essential (primary) hypertension: Secondary | ICD-10-CM

## 2024-07-21 DIAGNOSIS — E782 Mixed hyperlipidemia: Secondary | ICD-10-CM | POA: Diagnosis not present

## 2024-07-21 DIAGNOSIS — E118 Type 2 diabetes mellitus with unspecified complications: Secondary | ICD-10-CM

## 2024-07-21 LAB — POCT CBG (FASTING - GLUCOSE)-MANUAL ENTRY: Glucose Fasting, POC: 111 mg/dL — AB (ref 70–99)

## 2024-07-21 MED ORDER — WEGOVY 0.25 MG/0.5ML ~~LOC~~ SOAJ: 0.2500 mg | SUBCUTANEOUS | 0 refills | Status: AC

## 2024-07-21 MED ORDER — LOSARTAN POTASSIUM 50 MG PO TABS: 50.0000 mg | ORAL_TABLET | Freq: Every day | ORAL | 3 refills | Status: AC

## 2024-07-21 MED ORDER — CARVEDILOL 25 MG PO TABS: 25.0000 mg | ORAL_TABLET | Freq: Two times a day (BID) | ORAL | 3 refills | Status: AC

## 2024-07-21 MED ORDER — WEGOVY 0.25 MG/0.5ML ~~LOC~~ SOAJ: 0.2500 mg | SUBCUTANEOUS | 0 refills | Status: DC

## 2024-07-21 MED ORDER — LOSARTAN POTASSIUM 50 MG PO TABS: 50.0000 mg | ORAL_TABLET | Freq: Every day | ORAL | 3 refills | Status: DC

## 2024-07-21 NOTE — Progress Notes (Signed)
 Established Patient Office Visit  Subjective:  Patient ID: Christina Mcclain, female    DOB: 1956/04/10  Age: 68 y.o. MRN: [id number]  Chief Complaint  Patient presents with   Follow-up    Patient is here today for follow up. She reports she is doing well and keeping up with all her specialists appointments. She was on Jardiance  and Doreen but her Nephrologist d/c due to poor kidney function. She is interested in trying Wegovy  to help with weight loss but also help control her DM. She currently is just managing her DM with healthy diet and exercise, but her most recent Hgba1c is 7.3.  She had a drop in her HBG from 12.2 to 10.8 mg/dL from February until now. She reports she had L knee replacement surgery that has healed nicely and also had cataracts removed since she was last seen at the office. Will provide her with hemoccult stool cards to rule out for GI blood loss. Denies bloody stools or melena.     No other concerns at this time.   Past Medical History:  Diagnosis Date   Adverse effect of anesthesia    hard to awaken   Anemia    Arthritis    Asthma    Asthma without status asthmaticus    unspecified; Take advair  each night   Breast cancer (HCC) 2021   right breast IMC   Bronchitis    Chronic kidney disease    stage III per dr marcelino   Constipation    Diabetes (HCC) 2010   Diabetes mellitus type 2, uncomplicated (HCC)    Diabetes mellitus without complication (HCC)    Encounter for blood transfusion    hysterectomy 9-10 years ago   Heart murmur    unspecified   Hyperlipidemia    Hypertension 1991   Hypothyroid    Multiple thyroid  nodules    Neuromuscular disorder (HCC)    neuropathy   Osteoarthritis of right hip 02/04/2017   Palpitations    Pernicious anemia    Personal history of chemotherapy 2020-2021   Right breast ca   Personal history of radiation therapy 2021   right breast [facility]   Pneumonia    Sleep apnea    Use C-PAP; can't use mouth piece. Anxiety    Thyroid  disease    nodules   Thyroid  nodule    x2   Urinary urgency     Past Surgical History:  Procedure Laterality Date   ABDOMINAL HYSTERECTOMY  2011   partial   ABDOMINAL SURGERY     BREAST BIOPSY Right 10/02/2020   affirm bx, x marker, fat necrosis   BREAST BIOPSY Right 10/19/2019   two masses at 9:00 us  bx, coil marker and venus marker, IMC for both   BREAST LUMPECTOMY Right 03/28/2020   bracketing of two IMC masses at 9:00.  Venus marker was pushed medially during wire loc. Remains in breast.  Clear surgical margins. Negative Lymph node   CARPAL TUNNEL RELEASE Left 2008   CARPAL TUNNEL RELEASE Right    CHOLECYSTECTOMY  1982   COLONOSCOPY WITH PROPOFOL  N/A 10/05/2017   Procedure: COLONOSCOPY WITH PROPOFOL ;  Surgeon: Viktoria Lamar DASEN, MD;  Location: [facility] ENDOSCOPY;  Service: Endoscopy;  Laterality: N/A;   COLONOSCOPY WITH PROPOFOL  N/A 08/09/2021   Procedure: COLONOSCOPY WITH PROPOFOL ;  Surgeon: Onita Elspeth Sharper, DO;  Location: [facility] ENDOSCOPY;  Service: Gastroenterology;  Laterality: N/A;   DIAGNOSTIC LAPAROSCOPY     ESOPHAGOGASTRODUODENOSCOPY (EGD) WITH PROPOFOL  N/A 08/09/2021   Procedure: ESOPHAGOGASTRODUODENOSCOPY (EGD)  WITH PROPOFOL ;  Surgeon: Onita Elspeth Sharper, DO;  Location: [facility] ENDOSCOPY;  Service: Gastroenterology;  Laterality: N/A;   JOINT REPLACEMENT Right 02/09/2017   TOTAL HIP REPLACEMENT, DR. CARTER. [place]    KNEE ARTHROSCOPY  1990s   PARTIAL MASTECTOMY WITH NEEDLE LOCALIZATION AND AXILLARY SENTINEL LYMPH NODE BX Right 03/28/2020   Procedure: PARTIAL MASTECTOMY WITH NEEDLE LOCALIZATION AND AXILLARY SENTINEL LYMPH NODE BX;  Surgeon: Rodolph Romano, MD;  Location: [facility] ORS;  Service: General;  Laterality: Right;   PORTACATH PLACEMENT Left 11/07/2019   Procedure: INSERTION PORT-A-CATH;  Surgeon: Rodolph Romano, MD;  Location: [facility] ORS;  Service: General;  Laterality: Left;   TOTAL HIP ARTHROPLASTY Left 06/23/2017   Procedure: TOTAL HIP  ARTHROPLASTY ANTERIOR APPROACH;  Surgeon: Kathlynn Sharper, MD;  Location: [facility] ORS;  Service: Orthopedics;  Laterality: Left;    Social History   Socioeconomic History   Marital status: Married    Spouse name: Not on file   Number of children: 2   Years of education: some college   Highest education level: Not on file  Occupational History   Occupation: retired    Comment: [employer]  Tobacco Use   Smoking status: Never   Smokeless tobacco: Never  Vaping Use   Vaping status: Never Used  Substance and Sexual Activity   Alcohol use: No   Drug use: No   Sexual activity: Not Currently  Other Topics Concern   Not on file  Social History Narrative   Not on file   Social Drivers of Health   Financial Resource Strain: Low Risk  (10/05/2023)   Received from [facility]   Overall Financial Resource Strain (CARDIA)    Difficulty of Paying Living Expenses: Not hard at all  Food Insecurity: No Food Insecurity (10/05/2023)   Received from [facility]   Hunger Vital Sign    Within the past 12 months, you worried that your food would run out before you got the money to buy more.: Never true    Within the past 12 months, the food you bought just didn't last and you didn't have money to get more.: Never true  Transportation Needs: No Transportation Needs (10/05/2023)   Received from [facility] - Transportation    In the past 12 months, has lack of transportation kept you from medical appointments or from getting medications?: No    Lack of Transportation (Non-Medical): No  Physical Activity: Not on file  Stress: No Stress Concern Present (10/20/2023)   [employer] - Occupational Stress Questionnaire    Feeling of Stress : Only a little  Social Connections: Not on file  Intimate Partner Violence: Not on file    Family History  Problem Relation Age of Onset   Heart disease  Mother    Hypertension Mother    Arrhythmia Mother    Heart attack Mother    Diabetes Mother    Hypertension Father    Parkinson's disease Father    Coronary artery disease Maternal Uncle    Heart attack Maternal Uncle    Diabetes Sister    Diabetes Brother    Heart disease Brother    Breast cancer Neg Hx     Allergies  Allergen Reactions   Amlodipine Other (See Comments)    edema   Statins Rash    Other reaction(s): Muscle Pain Possible hives also   Sulfamethoxazole Rash    Outpatient Medications Prior to Visit  Medication Sig   acetaminophen  (TYLENOL ) 500 MG tablet Take 500 mg by mouth every 6 (six) hours as needed.   albuterol  (PROVENTIL  HFA;VENTOLIN  HFA) 108 (90 Base) MCG/ACT inhaler Inhale 2 puffs into the lungs every 6 (six) hours as needed for wheezing or shortness of breath.   Ascorbic Acid  (VITAMIN C ) 1000 MG tablet Take 2 tablets by mouth daily.   CALCIUM  CITRATE PO Take 1,000 mg by mouth daily.   cholecalciferol  (VITAMIN D ) 1000 units tablet Take 2,000 Units by mouth daily.   ezetimibe  (ZETIA ) 10 MG tablet TAKE 1 TABLET BY MOUTH EVERY DAY   Fluticasone -Salmeterol (ADVAIR ) 250-50 MCG/DOSE AEPB Inhale 1 puff into the lungs 2 (two) times daily.    furosemide (LASIX) 20 MG tablet  (Patient taking differently: Take 20 mg by mouth as needed.)   hydrALAZINE  (APRESOLINE ) 25 MG tablet Take 1 tablet (25 mg total) by mouth 2 times daily at 12 noon and 4 pm.   letrozole  (FEMARA ) 2.5 MG tablet    magnesium  oxide (MAG-OX) 400 MG tablet Take 2 tablets by mouth daily.   pregabalin (LYRICA) 50 MG capsule TAKE 1 CAPSULE BY MOUTH TWICE DAILY (Patient taking differently: Take 50 mg by mouth daily.)   solifenacin  (VESICARE ) 10 MG tablet TAKE 1 TABLET(10 MG) BY MOUTH EVERY EVENING   [DISCONTINUED] carvedilol  (COREG ) 25 MG tablet TAKE 1 TABLET BY MOUTH TWICE DAILY   [DISCONTINUED] losartan  (COZAAR ) 25 MG tablet    aspirin  EC 81 MG tablet Take 1 tablet twice a day by oral route. (Patient  not taking: Reported on 07/21/2024)   COLACE 100 MG capsule Take 1 capsule twice a day by oral route. (Patient not taking: Reported on 07/21/2024)   fluconazole (DIFLUCAN) 200 MG tablet Take 1 tablet by mouth once a week. (Patient not taking: Reported on 07/21/2024)   fluticasone -salmeterol (ADVAIR ) 250-50 MCG/ACT AEPB Inhale 1 puff into the lungs in the morning and at bedtime. (Patient not taking: Reported on 07/21/2024)   loratadine  (CLARITIN ) 10 MG tablet Take 10 mg by mouth daily as needed for allergies.  (Patient not taking: Reported on 07/21/2024)   methocarbamol  (ROBAXIN ) 500 MG tablet Take 1 tablet every 8 hours by oral route. (Patient not taking: Reported on 07/21/2024)   pantoprazole  (PROTONIX ) 40 MG tablet Take 40 mg by mouth daily. (Patient not taking: Reported on 07/21/2024)   [DISCONTINUED] empagliflozin  (JARDIANCE ) 10 MG TABS tablet Take 1 tablet (10 mg total) by mouth daily before breakfast. (Patient not taking: Reported on 07/21/2024)   No facility-administered medications prior to visit.    Review of Systems  Constitutional: Negative.  Negative for chills, fever, malaise/fatigue and weight loss.  HENT: Negative.  Negative for hearing loss and sore throat.   Eyes:  Positive for blurred vision. Negative for double vision, pain and discharge.  Respiratory: Negative.  Negative for cough and shortness of breath.   Cardiovascular: Negative.  Negative for chest pain, palpitations and leg swelling.  Gastrointestinal: Negative.  Negative for abdominal pain, blood in stool, constipation, diarrhea, heartburn, melena, nausea and vomiting.  Genitourinary: Negative.  Negative for dysuria and flank pain.  Musculoskeletal: Negative.  Negative for joint pain and myalgias.  Skin: Negative.   Neurological: Negative.  Negative for dizziness, tingling, tremors, sensory change and headaches.  Endo/Heme/Allergies: Negative.   Psychiatric/Behavioral: Negative.  Negative for depression and suicidal ideas.  The patient is not nervous/anxious.        Objective:   BP 130/84   Pulse 72   Ht 5' 2 (  1.575 m)   Wt 223 lb (101.2 kg)   SpO2 96%   BMI 40.79 kg/m   Vitals:   07/21/24 0904  BP: 130/84  Pulse: 72  Height: 5' 2 (1.575 m)  Weight: 223 lb (101.2 kg)  SpO2: 96%  BMI (Calculated): 40.78    Physical Exam Vitals and nursing note reviewed.  Constitutional:      Appearance: Normal appearance.  HENT:     Head: Normocephalic and atraumatic.     Nose: Nose normal.     Mouth/Throat:     Mouth: Mucous membranes are moist.     Pharynx: Oropharynx is clear.  Eyes:     Conjunctiva/sclera: Conjunctivae normal.     Pupils: Pupils are equal, round, and reactive to light.  Cardiovascular:     Rate and Rhythm: Normal rate and regular rhythm.     Pulses: Normal pulses.     Heart sounds: Murmur heard.  Pulmonary:     Effort: Pulmonary effort is normal.     Breath sounds: Normal breath sounds. No wheezing.  Abdominal:     General: Bowel sounds are normal.     Palpations: Abdomen is soft.     Tenderness: There is no abdominal tenderness. There is no right CVA tenderness or left CVA tenderness.  Musculoskeletal:        General: Normal range of motion.     Cervical back: Normal range of motion.     Right lower leg: No edema.     Left lower leg: No edema.  Skin:    General: Skin is warm and dry.  Neurological:     General: No focal deficit present.     Mental Status: She is alert and oriented to person, place, and time.  Psychiatric:        Mood and Affect: Mood normal.        Behavior: Behavior normal.      Results for orders placed or performed in visit on 07/21/24  POCT CBG (Fasting - Glucose)  Result Value Ref Range   Glucose Fasting, POC 111 (A) 70 - 99 mg/dL    Recent Results (from the past 2160 hours)  Urinalysis, Complete     Status: None   Collection Time: 05/12/24 10:48 AM  Result Value Ref Range   Specific Gravity, UA 1.015 1.005 - 1.030   pH, UA 6.0 5.0  - 7.5   Color, UA Yellow Yellow   Appearance Ur Clear Clear   Leukocytes,UA Negative Negative   Protein,UA Negative Negative/Trace   Glucose, UA Negative Negative   Ketones, UA Negative Negative   RBC, UA Negative Negative   Bilirubin, UA Negative Negative   Urobilinogen, Ur 0.2 0.2 - 1.0 mg/dL   Nitrite, UA Negative Negative   Microscopic Examination Comment     Comment: Microscopic follows if indicated.   Microscopic Examination See below:     Comment: Microscopic was indicated and was performed.  Microscopic Examination     Status: None   Collection Time: 05/12/24 10:48 AM   Urine  Result Value Ref Range   WBC, UA 0-5 0 - 5 /hpf   RBC, Urine 0-2 0 - 2 /hpf   Epithelial Cells (non renal) 0-10 0 - 10 /hpf   Bacteria, UA None seen None seen/Few  Urinalysis, Complete     Status: Abnormal   Collection Time: 06/16/24 11:11 AM  Result Value Ref Range   Specific Gravity, UA 1.010 1.005 - 1.030   pH, UA 6.5 5.0 -  7.5   Color, UA Yellow Yellow   Appearance Ur Clear Clear   Leukocytes,UA Trace (A) Negative   Protein,UA Negative Negative/Trace   Glucose, UA Negative Negative   Ketones, UA Negative Negative   RBC, UA Negative Negative   Bilirubin, UA Negative Negative   Urobilinogen, Ur 0.2 0.2 - 1.0 mg/dL   Nitrite, UA Negative Negative   Microscopic Examination See below:     Comment: Microscopic was indicated and was performed.  Microscopic Examination     Status: Abnormal   Collection Time: 06/16/24 11:11 AM   Urine  Result Value Ref Range   WBC, UA 6-10 (A) 0 - 5 /hpf   RBC, Urine 0-2 0 - 2 /hpf   Epithelial Cells (non renal) 0-10 0 - 10 /hpf   Bacteria, UA Few None seen/Few  Lipid Panel w/o Chol/HDL Ratio     Status: None   Collection Time: 07/15/24 11:36 AM  Result Value Ref Range   Cholesterol, Total 167 100 - 199 mg/dL   Triglycerides 56 0 - 149 mg/dL   HDL 69 >60 mg/dL   VLDL Cholesterol Cal 11 5 - 40 mg/dL   LDL Chol Calc (NIH) 87 0 - 99 mg/dL  CBC with  Differential/Platelet     Status: Abnormal   Collection Time: 07/15/24 11:36 AM  Result Value Ref Range   WBC 3.8 3.4 - 10.8 x10E3/uL   RBC 4.48 3.77 - 5.28 x10E6/uL   Hemoglobin 10.8 (L) 11.1 - 15.9 g/dL   Hematocrit 63.4 65.9 - 46.6 %   MCV 82 79 - 97 fL   MCH 24.1 (L) 26.6 - 33.0 pg   MCHC 29.6 (L) 31.5 - 35.7 g/dL   RDW 84.0 (H) 88.2 - 84.5 %   Platelets 165 150 - 450 x10E3/uL   Neutrophils 39 Not Estab. %   Lymphs 42 Not Estab. %   Monocytes 14 Not Estab. %   Eos 3 Not Estab. %   Basos 2 Not Estab. %   Neutrophils Absolute 1.5 1.4 - 7.0 x10E3/uL   Lymphocytes Absolute 1.6 0.7 - 3.1 x10E3/uL   Monocytes Absolute 0.5 0.1 - 0.9 x10E3/uL   EOS (ABSOLUTE) 0.1 0.0 - 0.4 x10E3/uL   Basophils Absolute 0.1 0.0 - 0.2 x10E3/uL   Immature Granulocytes 0 Not Estab. %   Immature Grans (Abs) 0.0 0.0 - 0.1 x10E3/uL  Hemoglobin A1c     Status: Abnormal   Collection Time: 07/15/24 11:36 AM  Result Value Ref Range   Hgb A1c MFr Bld 7.3 (H) 4.8 - 5.6 %    Comment:          Prediabetes: 5.7 - 6.4          Diabetes: >6.4          Glycemic control for adults with diabetes: <7.0    Est. average glucose Bld gHb Est-mCnc 163 mg/dL  [id number]     Status: Abnormal   Collection Time: 07/15/24 11:36 AM  Result Value Ref Range   Glucose 93 70 - 99 mg/dL   BUN 26 8 - 27 mg/dL   Creatinine, Ser 8.52 (H) 0.57 - 1.00 mg/dL   eGFR 38 (L) >40 fO/fpw/8.26   BUN/Creatinine Ratio 18 12 - 28   Sodium 142 134 - 144 mmol/L   Potassium 4.8 3.5 - 5.2 mmol/L   Chloride 101 96 - 106 mmol/L   CO2 27 20 - 29 mmol/L   Calcium  9.8 8.7 - 10.3 mg/dL   Total Protein 6.8  6.0 - 8.5 g/dL   Albumin 4.3 3.9 - 4.9 g/dL   Globulin, Total 2.5 1.5 - 4.5 g/dL   Bilirubin Total 0.2 0.0 - 1.2 mg/dL   Alkaline Phosphatase 130 (H) 44 - 121 IU/L   AST 10 0 - 40 IU/L   ALT 16 0 - 32 IU/L  POCT CBG (Fasting - Glucose)     Status: Abnormal   Collection Time: 07/21/24  9:14 AM  Result Value Ref Range   Glucose Fasting, POC 111  (A) 70 - 99 mg/dL      Assessment & Plan:  Continue taking medications as prescribed. Continue following specialists appointments and their recommendations.  Start taking Wegovy  0.25 mg once weekly injection for DM and weight loss. Encouraged strict diet and exercise as tolerated. Patient provided with Hemoccult stool card.  Problem List Items Addressed This Visit     Type 2 diabetes mellitus with stage 3b chronic kidney disease, without long-term current use of insulin (HCC) - Primary   Relevant Medications   semaglutide -weight management (WEGOVY ) 0.25 MG/0.5ML SOAJ SQ injection   losartan  (COZAAR ) 50 MG tablet   Gastro-esophageal reflux disease without esophagitis   Essential hypertension, benign   Relevant Medications   carvedilol  (COREG ) 25 MG tablet   losartan  (COZAAR ) 50 MG tablet   Hypertension associated with diabetes (HCC)   Relevant Medications   carvedilol  (COREG ) 25 MG tablet   losartan  (COZAAR ) 50 MG tablet   Combined hyperlipidemia associated with type 2 diabetes mellitus (HCC)   Relevant Medications   carvedilol  (COREG ) 25 MG tablet   losartan  (COZAAR ) 50 MG tablet   Other Visit Diagnoses       Controlled type 2 diabetes mellitus with complication, without long-term current use of insulin (HCC)       Relevant Medications   losartan  (COZAAR ) 50 MG tablet   Other Relevant Orders   POCT CBG (Fasting - Glucose) (Completed)       Follow up 1 month.  Total time spent: 30 minutes  FERNAND FREDY RAMAN, MD  07/21/2024   This document may have been prepared by [facility] Voice Recognition software and as such may include unintentional dictation errors.

## 2024-07-27 ENCOUNTER — Inpatient Hospital Stay: Attending: Oncology | Admitting: Oncology

## 2024-07-27 ENCOUNTER — Encounter: Payer: Self-pay | Admitting: Oncology

## 2024-07-27 ENCOUNTER — Other Ambulatory Visit: Payer: Self-pay | Admitting: Oncology

## 2024-07-27 VITALS — BP 154/67 | HR 50 | Temp 96.1°F | Resp 18 | Ht 62.0 in | Wt 222.2 lb

## 2024-07-27 DIAGNOSIS — C50911 Malignant neoplasm of unspecified site of right female breast: Secondary | ICD-10-CM | POA: Insufficient documentation

## 2024-07-27 DIAGNOSIS — Z1731 Human epidermal growth factor receptor 2 positive status: Secondary | ICD-10-CM | POA: Diagnosis not present

## 2024-07-27 DIAGNOSIS — Z1721 Progesterone receptor positive status: Secondary | ICD-10-CM | POA: Insufficient documentation

## 2024-07-27 DIAGNOSIS — D638 Anemia in other chronic diseases classified elsewhere: Secondary | ICD-10-CM | POA: Insufficient documentation

## 2024-07-27 DIAGNOSIS — Z79811 Long term (current) use of aromatase inhibitors: Secondary | ICD-10-CM | POA: Insufficient documentation

## 2024-07-27 DIAGNOSIS — Z853 Personal history of malignant neoplasm of breast: Secondary | ICD-10-CM | POA: Diagnosis not present

## 2024-07-27 DIAGNOSIS — Z79899 Other long term (current) drug therapy: Secondary | ICD-10-CM | POA: Insufficient documentation

## 2024-07-27 DIAGNOSIS — Z923 Personal history of irradiation: Secondary | ICD-10-CM | POA: Diagnosis not present

## 2024-07-27 DIAGNOSIS — Z170 Estrogen receptor positive status [ER+]: Secondary | ICD-10-CM | POA: Insufficient documentation

## 2024-07-27 DIAGNOSIS — Z9221 Personal history of antineoplastic chemotherapy: Secondary | ICD-10-CM | POA: Insufficient documentation

## 2024-07-27 DIAGNOSIS — Z08 Encounter for follow-up examination after completed treatment for malignant neoplasm: Secondary | ICD-10-CM

## 2024-07-27 DIAGNOSIS — D649 Anemia, unspecified: Secondary | ICD-10-CM | POA: Diagnosis not present

## 2024-07-27 DIAGNOSIS — C50919 Malignant neoplasm of unspecified site of unspecified female breast: Secondary | ICD-10-CM

## 2024-07-27 NOTE — Progress Notes (Signed)
 Hematology/Oncology Consult note [facility]  [phone] [phone]  Patient Care Team: Fernand Fredy RAMAN, MD as PCP - General (Internal Medicine) Dannielle Arlean FALCON, RN (Inactive) as Registered Nurse (Oncology) Melanee Annah BROCKS, MD as Consulting Physician (Hematology and Oncology) Melanee Annah BROCKS, MD as Consulting Physician (Hematology and Oncology) Melanee Annah BROCKS, MD as Consulting Physician (Hematology and Oncology) Lenn Aran, MD as Consulting Physician (Radiation Oncology) Rodolph Romano, MD as Consulting Physician (General Surgery)   Name of the patient: Christina Mcclain  [id number]  30-Jan-1956   Date of visit: 07/27/24  Diagnosis-  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 It showed 2 suspicious lesions in the right breast. Diagnostic mammogram and ultrasound confirmed 2 masses in the 9 o'clock position of the right breast measuring 1.1 x 1.2 x 1.2 cm and the other one measuring 0.8 x 0.8 x 0.9 cm. These 2 masses were 1.7 cm apart. Sonographic evaluation of the right axilla did not show any enlarged adenopathy. 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    Baseline MUGA scan showed EF of 58%.  MRI showed two distinct masses in the right breast 1.5 cm and 1.2 cm 2 cm apart no additional suspicious masses or abnormal enhancement identified.  No suspicious adenopathy   6 cycles of neoadjuvant TCHP chemotherapy completed on 02/29/2020.    Final pathology showed residual tumor of 5 mm.Negative margins.  1  sentinel lymph node negative for malignancy.  Overall cancer cellularity 1%.  Percentage of cancer that is in situ 0%.  [id number]   Patient is started on adjuvant Kadcyla  on 04/26/2020.  Treatment complicated by thrombocytopenia despite dose reduction. Patient switched to adjuvant Herceptin . Perjeta  not continued due to significant diarrhea in the neoadjuvant setting.  Patient completed adjuvant Herceptin  in February 2022.  She started letrozole  sometime in July 2021.    Interval history-overall she is doing well and tolerating letrozole  well.  She has baseline arthralgias which have been stable.  ECOG PS- 2 Pain scale- 3   Review of systems- Review of Systems  Constitutional:  Positive for malaise/fatigue. Negative for chills, fever and weight loss.  HENT:  Negative for congestion, ear discharge and nosebleeds.   Eyes:  Negative for blurred vision.  Respiratory:  Negative for cough, hemoptysis, sputum production, shortness of breath and wheezing.   Cardiovascular:  Negative for chest pain, palpitations, orthopnea and claudication.  Gastrointestinal:  Negative for abdominal pain, blood in stool, constipation, diarrhea, heartburn, melena, nausea and vomiting.  Genitourinary:  Negative for dysuria, flank pain, frequency, hematuria and urgency.  Musculoskeletal:  Positive for joint pain. Negative for back pain and myalgias.  Skin:  Negative for rash.  Neurological:  Negative for dizziness, tingling, focal weakness, seizures, weakness and headaches.  Endo/Heme/Allergies:  Does not bruise/bleed easily.  Psychiatric/Behavioral:  Negative for depression and suicidal ideas. The patient does not have insomnia.       Allergies  Allergen Reactions   Amlodipine Other (See Comments)    edema   Statins Rash    Other reaction(s): Muscle Pain Possible hives also  Sulfamethoxazole Rash     Past Medical History:  Diagnosis Date   Adverse effect of anesthesia    hard to awaken   Anemia     Arthritis    Asthma    Asthma without status asthmaticus    unspecified; Take advair  each night   Breast cancer (HCC) 2021   right breast IMC   Bronchitis    Chronic kidney disease    stage III per dr marcelino   Constipation    Diabetes (HCC) 2010   Diabetes mellitus type 2, uncomplicated (HCC)    Diabetes mellitus without complication (HCC)    Encounter for blood transfusion    hysterectomy 9-10 years ago   Heart murmur    unspecified   Hyperlipidemia    Hypertension 1991   Hypothyroid    Multiple thyroid  nodules    Neuromuscular disorder (HCC)    neuropathy   Osteoarthritis of right hip 02/04/2017   Palpitations    Pernicious anemia    Personal history of chemotherapy 2020-2021   Right breast ca   Personal history of radiation therapy 2021   right breast [facility]   Pneumonia    Sleep apnea    Use C-PAP; can't use mouth piece. Anxiety   Thyroid  disease    nodules   Thyroid  nodule    x2   Urinary urgency      Past Surgical History:  Procedure Laterality Date   ABDOMINAL HYSTERECTOMY  2011   partial   ABDOMINAL SURGERY     BREAST BIOPSY Right 10/02/2020   affirm bx, x marker, fat necrosis   BREAST BIOPSY Right 10/19/2019   two masses at 9:00 us  bx, coil marker and venus marker, IMC for both   BREAST LUMPECTOMY Right 03/28/2020   bracketing of two IMC masses at 9:00.  Venus marker was pushed medially during wire loc. Remains in breast.  Clear surgical margins. Negative Lymph node   CARPAL TUNNEL RELEASE Left 2008   CARPAL TUNNEL RELEASE Right    CHOLECYSTECTOMY  1982   COLONOSCOPY WITH PROPOFOL  N/A 10/05/2017   Procedure: COLONOSCOPY WITH PROPOFOL ;  Surgeon: Viktoria Lamar DASEN, MD;  Location: [facility] ENDOSCOPY;  Service: Endoscopy;  Laterality: N/A;   COLONOSCOPY WITH PROPOFOL  N/A 08/09/2021   Procedure: COLONOSCOPY WITH PROPOFOL ;  Surgeon: Onita Elspeth Sharper, DO;  Location: [facility] ENDOSCOPY;  Service: Gastroenterology;  Laterality: N/A;   DIAGNOSTIC LAPAROSCOPY      ESOPHAGOGASTRODUODENOSCOPY (EGD) WITH PROPOFOL  N/A 08/09/2021   Procedure: ESOPHAGOGASTRODUODENOSCOPY (EGD) WITH PROPOFOL ;  Surgeon: Onita Elspeth Sharper, DO;  Location: [facility] ENDOSCOPY;  Service: Gastroenterology;  Laterality: N/A;   JOINT REPLACEMENT Right 02/09/2017   TOTAL HIP REPLACEMENT, DR. CARTER. [place]    KNEE ARTHROSCOPY  1990s   PARTIAL MASTECTOMY WITH NEEDLE LOCALIZATION AND AXILLARY SENTINEL LYMPH NODE BX Right 03/28/2020   Procedure: PARTIAL MASTECTOMY WITH NEEDLE LOCALIZATION AND AXILLARY SENTINEL LYMPH NODE BX;  Surgeon: Rodolph Romano, MD;  Location: [facility] ORS;  Service: General;  Laterality: Right;   PORTACATH PLACEMENT Left 11/07/2019   Procedure: INSERTION PORT-A-CATH;  Surgeon: Rodolph Romano, MD;  Location: [facility] ORS;  Service: General;  Laterality: Left;   TOTAL HIP ARTHROPLASTY Left 06/23/2017   Procedure: TOTAL HIP ARTHROPLASTY ANTERIOR APPROACH;  Surgeon: Kathlynn Sharper, MD;  Location: [facility] ORS;  Service: Orthopedics;  Laterality: Left;    Social History   Socioeconomic History   Marital status: Married    Spouse name: Not on file   Number of children: 2   Years of education: some college  Highest education level: Not on file  Occupational History   Occupation: retired    Comment: [employer]  Tobacco Use   Smoking status: Never   Smokeless tobacco: Never  Vaping Use   Vaping status: Never Used  Substance and Sexual Activity   Alcohol use: No   Drug use: No   Sexual activity: Not Currently  Other Topics Concern   Not on file  Social History Narrative   Not on file   Social Drivers of Health   Financial Resource Strain: Low Risk  (10/05/2023)   Received from [facility]   Overall Financial Resource Strain (CARDIA)    Difficulty of Paying Living Expenses: Not hard at all  Food Insecurity: No Food Insecurity (10/05/2023)   Received from [facility]   Hunger Vital Sign    Within the past  12 months, you worried that your food would run out before you got the money to buy more.: Never true    Within the past 12 months, the food you bought just didn't last and you didn't have money to get more.: Never true  Transportation Needs: No Transportation Needs (10/05/2023)   Received from [facility] - Transportation    In the past 12 months, has lack of transportation kept you from medical appointments or from getting medications?: No    Lack of Transportation (Non-Medical): No  Physical Activity: Not on file  Stress: No Stress Concern Present (10/20/2023)   [employer] of [employer] - Occupational Stress Questionnaire    Feeling of Stress : Only a little  Social Connections: Not on file  Intimate Partner Violence: Not on file    Family History  Problem Relation Age of Onset   Heart disease Mother    Hypertension Mother    Arrhythmia Mother    Heart attack Mother    Diabetes Mother    Hypertension Father    Parkinson's disease Father    Coronary artery disease Maternal Uncle    Heart attack Maternal Uncle    Diabetes Sister    Diabetes Brother    Heart disease Brother    Breast cancer Neg Hx      Current Outpatient Medications:    acetaminophen  (TYLENOL ) 500 MG tablet, Take 500 mg by mouth every 6 (six) hours as needed., Disp: , Rfl:    albuterol  (PROVENTIL  HFA;VENTOLIN  HFA) 108 (90 Base) MCG/ACT inhaler, Inhale 2 puffs into the lungs every 6 (six) hours as needed for wheezing or shortness of breath., Disp: , Rfl:    Ascorbic Acid  (VITAMIN C ) 1000 MG tablet, Take 2 tablets by mouth daily., Disp: , Rfl:    CALCIUM  CITRATE PO, Take 1,000 mg by mouth daily., Disp: , Rfl:    carvedilol  (COREG ) 25 MG tablet, Take 1 tablet (25 mg total) by mouth 2 (two) times daily., Disp: 180 tablet, Rfl: 3   chlorthalidone (HYGROTON) 25 MG tablet, Take 25 mg by mouth daily., Disp: , Rfl:    cholecalciferol  (VITAMIN D ) 1000 units tablet, Take 2,000  Units by mouth daily., Disp: , Rfl:    ezetimibe  (ZETIA ) 10 MG tablet, TAKE 1 TABLET BY MOUTH EVERY DAY, Disp: 90 tablet, Rfl: 3   Fluticasone -Salmeterol (ADVAIR ) 250-50 MCG/DOSE AEPB, Inhale 1 puff into the lungs 2 (two) times daily. , Disp: , Rfl:    hydrALAZINE  (APRESOLINE ) 25 MG tablet, Take 1 tablet (25 mg total) by mouth 2 times daily at 12 noon and  4 pm., Disp: 180 tablet, Rfl: 3   letrozole  (FEMARA ) 2.5 MG tablet, , Disp: , Rfl:    losartan  (COZAAR ) 50 MG tablet, Take 1 tablet (50 mg total) by mouth daily., Disp: 90 tablet, Rfl: 3   magnesium  oxide (MAG-OX) 400 MG tablet, Take 2 tablets by mouth daily., Disp: , Rfl:    pregabalin (LYRICA) 50 MG capsule, TAKE 1 CAPSULE BY MOUTH TWICE DAILY (Patient taking differently: Take 50 mg by mouth daily.), Disp: 180 capsule, Rfl: 3   solifenacin  (VESICARE ) 10 MG tablet, TAKE 1 TABLET(10 MG) BY MOUTH EVERY EVENING, Disp: 90 tablet, Rfl: 1   aspirin  EC 81 MG tablet, Take 1 tablet twice a day by oral route. (Patient not taking: Reported on 07/21/2024), Disp: , Rfl:    COLACE 100 MG capsule, Take 1 capsule twice a day by oral route. (Patient not taking: Reported on 07/21/2024), Disp: , Rfl:    fluconazole (DIFLUCAN) 200 MG tablet, Take 1 tablet by mouth once a week. (Patient not taking: Reported on 07/21/2024), Disp: , Rfl:    fluticasone -salmeterol (ADVAIR ) 250-50 MCG/ACT AEPB, Inhale 1 puff into the lungs in the morning and at bedtime. (Patient not taking: Reported on 07/21/2024), Disp: 60 each, Rfl: 2   furosemide (LASIX) 20 MG tablet, , Disp: , Rfl:    loratadine  (CLARITIN ) 10 MG tablet, Take 10 mg by mouth daily as needed for allergies.  (Patient not taking: Reported on 07/21/2024), Disp: , Rfl:    methocarbamol  (ROBAXIN ) 500 MG tablet, Take 1 tablet every 8 hours by oral route. (Patient not taking: Reported on 07/21/2024), Disp: , Rfl:    pantoprazole  (PROTONIX ) 40 MG tablet, Take 40 mg by mouth daily. (Patient not taking: Reported on 07/21/2024), Disp: ,  Rfl:    semaglutide -weight management (WEGOVY ) 0.25 MG/0.5ML SOAJ SQ injection, Inject 0.25 mg into the skin once a week. (Patient not taking: Reported on 07/27/2024), Disp: 2 mL, Rfl: 0  Physical exam:  Vitals:   07/27/24 1050  BP: (!) 154/67  Pulse: (!) 50  Resp: 18  Temp: (!) 96.1 F (35.6 C)  TempSrc: Tympanic  SpO2: 100%  Weight: 222 lb 3.2 oz (100.8 kg)  Height: 5' 2 (1.575 m)   Physical Exam Cardiovascular:     Rate and Rhythm: Normal rate and regular rhythm.     Heart sounds: Normal heart sounds.  Pulmonary:     Effort: Pulmonary effort is normal.     Breath sounds: Normal breath sounds.  Skin:    General: Skin is warm and dry.  Neurological:     Mental Status: She is alert and oriented to person, place, and time.    Breast exam was performed in seated and lying down position. Patient is status post right lumpectomy with a well-healed surgical scar. No evidence of any palpable masses. No evidence of axillary adenopathy. No evidence of any palpable masses or lumps in the left breast. No evidence of leftt axillary adenopathy   I have personally reviewed labs listed below:    Latest Ref Rng & Units 07/15/2024   11:36 AM  CMP  Glucose 70 - 99 mg/dL 93   BUN 8 - 27 mg/dL 26   Creatinine 9.42 - 1.00 mg/dL 8.52   Sodium 865 - 855 mmol/L 142   Potassium 3.5 - 5.2 mmol/L 4.8   Chloride 96 - 106 mmol/L 101   CO2 20 - 29 mmol/L 27   Calcium  8.7 - 10.3 mg/dL 9.8   Total Protein 6.0 -  8.5 g/dL 6.8   Total Bilirubin 0.0 - 1.2 mg/dL 0.2   Alkaline Phos 44 - 121 IU/L 130   AST 0 - 40 IU/L 10   ALT 0 - 32 IU/L 16       Latest Ref Rng & Units 07/15/2024   11:36 AM  CBC  WBC 3.4 - 10.8 x10E3/uL 3.8   Hemoglobin 11.1 - 15.9 g/dL 89.1   Hematocrit 65.9 - 46.6 % 36.5   Platelets 150 - 450 x10E3/uL 165      Assessment and plan- Patient is a 68 y.o. female with invasive mammary carcinoma of the right breast clinical prognostic stage Ia mc T1 ccN0 cM0 ER/PR positive  HER-2/neu positive.  She is s/p 6 cycles of neoadjuvant TCHP chemotherapy with residual 5 mm tumor.  She had persistent cytopenias and thrombocytopenia with Kadcyla  and was switched to single agent maintenance Herceptin  which she completed in February 2022.  She is here for routine follow-up of breast cancer on letrozole   Clinically patient is doing well No concerning sign symptoms of recurrence based on today's exam.  Patient will continue taking letrozole  until July 2026 when she completes 5 years.  She is overdue for a bone density scan which we will schedule at this point.  I will see her back in 6 months no labs.  With regards to her normocytic anemia patient has baseline anemia of chronic disease with a hemoglobin that has been around 10-11 on rare occasion she is at 12.  She can get this checked with Dr. Fernand in the near future and if there is a continued decline in her hemoglobin I will consider getting additional workup at that time   Visit Diagnosis 1. Encounter for follow-up surveillance of breast cancer   2. Normocytic anemia   3. High risk medication use      Dr. Annah Skene, MD, MPH [facility] [phone] 07/27/2024 4:26 PM

## 2024-07-27 NOTE — Progress Notes (Signed)
 Patient doing overall ok, but would like her right breast examined due to some pain & discomfort near the surgery site. She states it is sore and tender. She also is having some pelvic pain/discomfort; hysterectomy was performed at least 10 years ago, but kept her ovaries. She would like to be examined there also to make sure everything is okay.

## 2024-07-28 ENCOUNTER — Ambulatory Visit
Admission: RE | Admit: 2024-07-28 | Discharge: 2024-07-28 | Disposition: A | Discharge status: Home / Self Care | Source: Ambulatory Visit | Attending: Oncology | Admitting: Oncology

## 2024-07-28 DIAGNOSIS — Z780 Asymptomatic menopausal state: Secondary | ICD-10-CM | POA: Insufficient documentation

## 2024-07-28 DIAGNOSIS — Z08 Encounter for follow-up examination after completed treatment for malignant neoplasm: Secondary | ICD-10-CM | POA: Diagnosis not present

## 2024-07-28 DIAGNOSIS — Z79811 Long term (current) use of aromatase inhibitors: Secondary | ICD-10-CM | POA: Insufficient documentation

## 2024-07-28 DIAGNOSIS — Z853 Personal history of malignant neoplasm of breast: Secondary | ICD-10-CM | POA: Diagnosis not present

## 2024-08-01 ENCOUNTER — Other Ambulatory Visit (INDEPENDENT_AMBULATORY_CARE_PROVIDER_SITE_OTHER): Payer: Self-pay | Admitting: Internal Medicine

## 2024-08-01 DIAGNOSIS — D510 Vitamin B12 deficiency anemia due to intrinsic factor deficiency: Secondary | ICD-10-CM

## 2024-08-01 LAB — POC HEMOCCULT BLD/STL (OFFICE/1-CARD/DIAGNOSTIC)
Card #2 Fecal Occult Blod, POC: POSITIVE
Card #3 Fecal Occult Blood, POC: NEGATIVE
Fecal Occult Blood, POC: POSITIVE — AB

## 2024-08-02 ENCOUNTER — Ambulatory Visit: Payer: Self-pay | Admitting: Internal Medicine

## 2024-08-02 DIAGNOSIS — Z1211 Encounter for screening for malignant neoplasm of colon: Secondary | ICD-10-CM

## 2024-08-05 ENCOUNTER — Other Ambulatory Visit: Payer: Self-pay | Admitting: Internal Medicine

## 2024-08-08 ENCOUNTER — Other Ambulatory Visit: Payer: Self-pay | Admitting: Oncology

## 2024-08-08 MED ORDER — FLUTICASONE-SALMETEROL 250-50 MCG/ACT IN AEPB: 1.0000 | INHALATION_SPRAY | Freq: Two times a day (BID) | RESPIRATORY_TRACT | 2 refills | Status: DC

## 2024-08-08 NOTE — Progress Notes (Signed)
 Pt called and left vm requesting a call back regarding results

## 2024-08-09 ENCOUNTER — Other Ambulatory Visit: Payer: Self-pay | Admitting: Internal Medicine

## 2024-08-09 DIAGNOSIS — J4520 Mild intermittent asthma, uncomplicated: Secondary | ICD-10-CM

## 2024-08-09 MED ORDER — ALBUTEROL SULFATE HFA 108 (90 BASE) MCG/ACT IN AERS: 2.0000 | INHALATION_SPRAY | Freq: Four times a day (QID) | RESPIRATORY_TRACT | 2 refills | Status: AC | PRN

## 2024-08-10 ENCOUNTER — Other Ambulatory Visit: Payer: Self-pay

## 2024-08-10 NOTE — Telephone Encounter (Signed)
 Pt called requesting refill on advair  [facility], also asked for a 90 day supply. Please advise

## 2024-08-11 MED ORDER — FLUTICASONE-SALMETEROL 250-50 MCG/ACT IN AEPB: 1.0000 | INHALATION_SPRAY | Freq: Two times a day (BID) | RESPIRATORY_TRACT | 3 refills | Status: AC

## 2024-08-14 ENCOUNTER — Other Ambulatory Visit: Payer: Self-pay | Admitting: Internal Medicine

## 2024-08-14 DIAGNOSIS — E782 Mixed hyperlipidemia: Secondary | ICD-10-CM

## 2024-08-22 ENCOUNTER — Ambulatory Visit: Admitting: Internal Medicine

## 2024-08-23 ENCOUNTER — Other Ambulatory Visit

## 2024-09-06 ENCOUNTER — Ambulatory Visit
Admission: RE | Admit: 2024-09-06 | Discharge: 2024-09-06 | Disposition: A | Discharge status: Home / Self Care | Attending: Internal Medicine | Admitting: Internal Medicine

## 2024-09-06 ENCOUNTER — Encounter: Payer: Self-pay | Admitting: Internal Medicine

## 2024-09-06 ENCOUNTER — Ambulatory Visit (INDEPENDENT_AMBULATORY_CARE_PROVIDER_SITE_OTHER): Admitting: Internal Medicine

## 2024-09-06 ENCOUNTER — Ambulatory Visit: Payer: Self-pay | Admitting: Internal Medicine

## 2024-09-06 ENCOUNTER — Ambulatory Visit
Admission: RE | Admit: 2024-09-06 | Discharge: 2024-09-06 | Disposition: A | Source: Ambulatory Visit | Attending: Internal Medicine | Admitting: Internal Medicine

## 2024-09-06 VITALS — BP 140/80 | HR 60 | Ht 62.0 in | Wt 222.0 lb

## 2024-09-06 DIAGNOSIS — Z6841 Body Mass Index (BMI) 40.0 and over, adult: Secondary | ICD-10-CM | POA: Diagnosis not present

## 2024-09-06 DIAGNOSIS — M7501 Adhesive capsulitis of right shoulder: Secondary | ICD-10-CM | POA: Diagnosis not present

## 2024-09-06 DIAGNOSIS — E782 Mixed hyperlipidemia: Secondary | ICD-10-CM

## 2024-09-06 DIAGNOSIS — M25511 Pain in right shoulder: Secondary | ICD-10-CM | POA: Diagnosis not present

## 2024-09-06 DIAGNOSIS — E118 Type 2 diabetes mellitus with unspecified complications: Secondary | ICD-10-CM | POA: Diagnosis not present

## 2024-09-06 DIAGNOSIS — E1169 Type 2 diabetes mellitus with other specified complication: Secondary | ICD-10-CM | POA: Diagnosis not present

## 2024-09-06 DIAGNOSIS — E1159 Type 2 diabetes mellitus with other circulatory complications: Secondary | ICD-10-CM

## 2024-09-06 DIAGNOSIS — I152 Hypertension secondary to endocrine disorders: Secondary | ICD-10-CM

## 2024-09-06 DIAGNOSIS — M1380 Other specified arthritis, unspecified site: Secondary | ICD-10-CM

## 2024-09-06 LAB — POCT CBG (FASTING - GLUCOSE)-MANUAL ENTRY: Glucose Fasting, POC: 115 mg/dL — AB (ref 70–99)

## 2024-09-06 MED ORDER — PREDNISONE 20 MG PO TABS: 40.0000 mg | ORAL_TABLET | Freq: Every day | ORAL | 0 refills | Status: AC

## 2024-09-06 NOTE — Progress Notes (Signed)
 Established Patient Office Visit  Subjective:  Patient ID: Christina Mcclain, female    DOB: 1955/12/03  Age: 68 y.o. MRN: [id number]  Chief Complaint  Patient presents with   Shoulder Pain    bilateral    Patient reports she is here for follow up. She is doing well but has complaints of bilateral shoulder pain. Reports bilateral shoulder pain R>L. Reports decreased ROM, crepitus, tenderness going down arms. Reports difficulty sleeping. She can not have Nsaids due to poor kidney function. Patient is followed closely by Nephrology. Will do shoulder xray and start prednisone burst. Discussed need to do ROM exercises and PT if symptoms don't improve.  Patient's BP is elevated but states she has not taken her medications yet today. Patient was prescribed Wegovy  to help with weight loss and Blood sugar control. Patient reports she wants to do diet lifestyle modifications so she has not picked up or started the Wegovy . She reports starting  virtual diabetic education program called Sugar ain't sweet that promotes daily check-ins and healthy meal planning.  Recent stool cards were positive; patient reports GI has not received referral yet when she called them. Records indicate referral was sent to GI on 08/09/24. Encouraged patient to contact them again to have appointment scheduled for positive stool cards.  Patient declines flu shot at this time. Patient has no additional concerns at this time.   Shoulder Pain  Pertinent negatives include no fever or tingling.    No other concerns at this time.   Past Medical History:  Diagnosis Date   Adverse effect of anesthesia    hard to awaken   Anemia    Arthritis    Asthma    Asthma without status asthmaticus    unspecified; Take advair  each night   Breast cancer (HCC) 2021   right breast IMC   Bronchitis    Chronic kidney disease    stage III per dr marcelino   Constipation    Diabetes (HCC) 2010   Diabetes mellitus type 2, uncomplicated  (HCC)    Diabetes mellitus without complication (HCC)    Encounter for blood transfusion    hysterectomy 9-10 years ago   Heart murmur    unspecified   Hyperlipidemia    Hypertension 1991   Hypothyroid    Multiple thyroid  nodules    Neuromuscular disorder (HCC)    neuropathy   Osteoarthritis of right hip 02/04/2017   Palpitations    Pernicious anemia    Personal history of chemotherapy 2020-2021   Right breast ca   Personal history of radiation therapy 2021   right breast [facility]   Pneumonia    Sleep apnea    Use C-PAP; can't use mouth piece. Anxiety   Thyroid  disease    nodules   Thyroid  nodule    x2   Urinary urgency     Past Surgical History:  Procedure Laterality Date   ABDOMINAL HYSTERECTOMY  2011   partial   ABDOMINAL SURGERY     BREAST BIOPSY Right 10/02/2020   affirm bx, x marker, fat necrosis   BREAST BIOPSY Right 10/19/2019   two masses at 9:00 us  bx, coil marker and venus marker, IMC for both   BREAST LUMPECTOMY Right 03/28/2020   bracketing of two IMC masses at 9:00.  Venus marker was pushed medially during wire loc. Remains in breast.  Clear surgical margins. Negative Lymph node   CARPAL TUNNEL RELEASE Left 2008   CARPAL TUNNEL RELEASE Right    CHOLECYSTECTOMY  1982   COLONOSCOPY WITH PROPOFOL  N/A 10/05/2017   Procedure: COLONOSCOPY WITH PROPOFOL ;  Surgeon: Viktoria Lamar DASEN, MD;  Location: [facility] ENDOSCOPY;  Service: Endoscopy;  Laterality: N/A;   COLONOSCOPY WITH PROPOFOL  N/A 08/09/2021   Procedure: COLONOSCOPY WITH PROPOFOL ;  Surgeon: Onita Elspeth Sharper, DO;  Location: [facility] ENDOSCOPY;  Service: Gastroenterology;  Laterality: N/A;   DIAGNOSTIC LAPAROSCOPY     ESOPHAGOGASTRODUODENOSCOPY (EGD) WITH PROPOFOL  N/A 08/09/2021   Procedure: ESOPHAGOGASTRODUODENOSCOPY (EGD) WITH PROPOFOL ;  Surgeon: Onita Elspeth Sharper, DO;  Location: [facility] ENDOSCOPY;  Service: Gastroenterology;  Laterality: N/A;   JOINT REPLACEMENT Right 02/09/2017   TOTAL HIP REPLACEMENT, DR.  CARTER. [place]    KNEE ARTHROSCOPY  1990s   PARTIAL MASTECTOMY WITH NEEDLE LOCALIZATION AND AXILLARY SENTINEL LYMPH NODE BX Right 03/28/2020   Procedure: PARTIAL MASTECTOMY WITH NEEDLE LOCALIZATION AND AXILLARY SENTINEL LYMPH NODE BX;  Surgeon: Rodolph Romano, MD;  Location: [facility] ORS;  Service: General;  Laterality: Right;   PORTACATH PLACEMENT Left 11/07/2019   Procedure: INSERTION PORT-A-CATH;  Surgeon: Rodolph Romano, MD;  Location: [facility] ORS;  Service: General;  Laterality: Left;   TOTAL HIP ARTHROPLASTY Left 06/23/2017   Procedure: TOTAL HIP ARTHROPLASTY ANTERIOR APPROACH;  Surgeon: Kathlynn Sharper, MD;  Location: [facility] ORS;  Service: Orthopedics;  Laterality: Left;    Social History   Socioeconomic History   Marital status: Married    Spouse name: Not on file   Number of children: 2   Years of education: some college   Highest education level: Not on file  Occupational History   Occupation: retired    Comment: [employer]  Tobacco Use   Smoking status: Never   Smokeless tobacco: Never  Vaping Use   Vaping status: Never Used  Substance and Sexual Activity   Alcohol use: No   Drug use: No   Sexual activity: Not Currently  Other Topics Concern   Not on file  Social History Narrative   Not on file   Social Drivers of Health   Financial Resource Strain: Low Risk  (10/05/2023)   Received from [facility]   Overall Financial Resource Strain (CARDIA)    Difficulty of Paying Living Expenses: Not hard at all  Food Insecurity: No Food Insecurity (10/05/2023)   Received from [facility]   Hunger Vital Sign    Within the past 12 months, you worried that your food would run out before you got the money to buy more.: Never true    Within the past 12 months, the food you bought just didn't last and you didn't have money to get more.: Never true  Transportation Needs: No Transportation Needs (10/05/2023)   Received from  [facility] - Transportation    In the past 12 months, has lack of transportation kept you from medical appointments or from getting medications?: No    Lack of Transportation (Non-Medical): No  Physical Activity: Not on file  Stress: No Stress Concern Present (10/20/2023)   [employer] - Occupational Stress Questionnaire    Feeling of Stress : Only a little  Social Connections: Not on file  Intimate Partner Violence: Not on file    Family History  Problem Relation Age of Onset   Heart disease Mother    Hypertension Mother    Arrhythmia Mother    Heart attack Mother    Diabetes Mother    Hypertension Father    Parkinson's disease Father    Coronary artery  disease Maternal Uncle    Heart attack Maternal Uncle    Diabetes Sister    Diabetes Brother    Heart disease Brother    Breast cancer Neg Hx     Allergies  Allergen Reactions   Amlodipine Other (See Comments)    edema   Statins Rash    Other reaction(s): Muscle Pain Possible hives also   Sulfamethoxazole Rash    Outpatient Medications Prior to Visit  Medication Sig   acetaminophen  (TYLENOL ) 500 MG tablet Take 500 mg by mouth every 6 (six) hours as needed.   albuterol  (VENTOLIN  HFA) 108 (90 Base) MCG/ACT inhaler Inhale 2 puffs into the lungs every 6 (six) hours as needed for wheezing or shortness of breath.   Ascorbic Acid  (VITAMIN C ) 1000 MG tablet Take 2 tablets by mouth daily.   CALCIUM  CITRATE PO Take 1,000 mg by mouth daily.   carvedilol  (COREG ) 25 MG tablet Take 1 tablet (25 mg total) by mouth 2 (two) times daily.   chlorthalidone (HYGROTON) 25 MG tablet Take 25 mg by mouth daily.   cholecalciferol  (VITAMIN D ) 1000 units tablet Take 2,000 Units by mouth daily.   ezetimibe  (ZETIA ) 10 MG tablet TAKE 1 TABLET(10 MG) BY MOUTH DAILY   fluticasone -salmeterol (ADVAIR ) 250-50 MCG/ACT AEPB Inhale 1 puff into the lungs in the morning and at bedtime.   furosemide  (LASIX) 20 MG tablet  (Patient taking differently: Take 20 mg by mouth as needed.)   hydrALAZINE  (APRESOLINE ) 25 MG tablet Take 1 tablet (25 mg total) by mouth 2 times daily at 12 noon and 4 pm.   letrozole  (FEMARA ) 2.5 MG tablet TAKE 1 TABLET(2.5 MG) BY MOUTH DAILY   losartan  (COZAAR ) 50 MG tablet Take 1 tablet (50 mg total) by mouth daily.   magnesium  oxide (MAG-OX) 400 MG tablet Take 2 tablets by mouth daily.   pregabalin (LYRICA) 50 MG capsule TAKE 1 CAPSULE BY MOUTH TWICE DAILY (Patient taking differently: Take 50 mg by mouth daily.)   solifenacin  (VESICARE ) 10 MG tablet TAKE 1 TABLET(10 MG) BY MOUTH EVERY EVENING   aspirin  EC 81 MG tablet Take 1 tablet twice a day by oral route. (Patient not taking: Reported on 09/06/2024)   COLACE 100 MG capsule Take 1 capsule twice a day by oral route. (Patient not taking: Reported on 09/06/2024)   fluconazole (DIFLUCAN) 200 MG tablet Take 1 tablet by mouth once a week. (Patient not taking: Reported on 09/06/2024)   loratadine  (CLARITIN ) 10 MG tablet Take 10 mg by mouth daily as needed for allergies.  (Patient not taking: Reported on 09/06/2024)   methocarbamol  (ROBAXIN ) 500 MG tablet Take 1 tablet every 8 hours by oral route. (Patient not taking: Reported on 09/06/2024)   pantoprazole  (PROTONIX ) 40 MG tablet Take 40 mg by mouth daily. (Patient not taking: Reported on 09/06/2024)   semaglutide -weight management (WEGOVY ) 0.25 MG/0.5ML SOAJ SQ injection Inject 0.25 mg into the skin once a week. (Patient not taking: Reported on 09/06/2024)   No facility-administered medications prior to visit.    Review of Systems  Constitutional: Negative.  Negative for chills, fever and malaise/fatigue.  HENT: Negative.  Negative for congestion and sore throat.   Eyes: Negative.  Negative for blurred vision and pain.  Respiratory: Negative.  Negative for cough and shortness of breath.   Cardiovascular: Negative.  Negative for chest pain, palpitations and leg swelling.   Gastrointestinal: Negative.  Negative for abdominal pain, blood in stool, constipation, diarrhea, heartburn, melena, nausea and vomiting.  Genitourinary:  Negative.  Negative for dysuria, flank pain, frequency and urgency.  Musculoskeletal:  Positive for joint pain (bilateral shoulder pain, reports popping sensation pain radiating down arms. R>L). Negative for myalgias.  Skin: Negative.   Neurological:  Positive for headaches (reports old cervical fx; sees Dr. Loreli). Negative for dizziness, tingling, sensory change and weakness.  Endo/Heme/Allergies: Negative.   Psychiatric/Behavioral: Negative.  Negative for depression and suicidal ideas. The patient is not nervous/anxious.        Objective:   BP (!) 140/80   Pulse 60   Ht 5' 2 (1.575 m)   Wt 222 lb (100.7 kg)   SpO2 97%   BMI 40.60 kg/m   Vitals:   09/06/24 1125  BP: (!) 140/80  Pulse: 60  Height: 5' 2 (1.575 m)  Weight: 222 lb (100.7 kg)  SpO2: 97%  BMI (Calculated): 40.59    Physical Exam Vitals and nursing note reviewed.  Constitutional:      Appearance: Normal appearance.  HENT:     Head: Normocephalic and atraumatic.     Nose: Nose normal.     Mouth/Throat:     Mouth: Mucous membranes are moist.     Pharynx: Oropharynx is clear.  Eyes:     Conjunctiva/sclera: Conjunctivae normal.     Pupils: Pupils are equal, round, and reactive to light.  Cardiovascular:     Rate and Rhythm: Normal rate and regular rhythm.     Pulses: Normal pulses.     Heart sounds: Normal heart sounds. No murmur heard. Pulmonary:     Effort: Pulmonary effort is normal.     Breath sounds: Normal breath sounds. No wheezing.  Abdominal:     General: Bowel sounds are normal.     Palpations: Abdomen is soft.     Tenderness: There is no abdominal tenderness. There is no right CVA tenderness or left CVA tenderness.  Musculoskeletal:     Right shoulder: Tenderness and crepitus present. Decreased range of motion.     Left shoulder:  Decreased range of motion.     Cervical back: Normal range of motion.     Right lower leg: No edema.     Left lower leg: No edema.  Skin:    General: Skin is warm and dry.  Neurological:     General: No focal deficit present.     Mental Status: She is alert and oriented to person, place, and time.  Psychiatric:        Mood and Affect: Mood normal.        Behavior: Behavior normal.      Results for orders placed or performed in visit on 09/06/24  POCT CBG (Fasting - Glucose)  Result Value Ref Range   Glucose Fasting, POC 115 (A) 70 - 99 mg/dL    Recent Results (from the past 2160 hours)  Urinalysis, Complete     Status: Abnormal   Collection Time: 06/16/24 11:11 AM  Result Value Ref Range   Specific Gravity, UA 1.010 1.005 - 1.030   pH, UA 6.5 5.0 - 7.5   Color, UA Yellow Yellow   Appearance Ur Clear Clear   Leukocytes,UA Trace (A) Negative   Protein,UA Negative Negative/Trace   Glucose, UA Negative Negative   Ketones, UA Negative Negative   RBC, UA Negative Negative   Bilirubin, UA Negative Negative   Urobilinogen, Ur 0.2 0.2 - 1.0 mg/dL   Nitrite, UA Negative Negative   Microscopic Examination See below:     Comment: Microscopic was indicated  and was performed.  Microscopic Examination     Status: Abnormal   Collection Time: 06/16/24 11:11 AM   Urine  Result Value Ref Range   WBC, UA 6-10 (A) 0 - 5 /hpf   RBC, Urine 0-2 0 - 2 /hpf   Epithelial Cells (non renal) 0-10 0 - 10 /hpf   Bacteria, UA Few None seen/Few  Lipid Panel w/o Chol/HDL Ratio     Status: None   Collection Time: 07/15/24 11:36 AM  Result Value Ref Range   Cholesterol, Total 167 100 - 199 mg/dL   Triglycerides 56 0 - 149 mg/dL   HDL 69 >60 mg/dL   VLDL Cholesterol Cal 11 5 - 40 mg/dL   LDL Chol Calc (NIH) 87 0 - 99 mg/dL  CBC with Differential/Platelet     Status: Abnormal   Collection Time: 07/15/24 11:36 AM  Result Value Ref Range   WBC 3.8 3.4 - 10.8 x10E3/uL   RBC 4.48 3.77 - 5.28  x10E6/uL   Hemoglobin 10.8 (L) 11.1 - 15.9 g/dL   Hematocrit 63.4 65.9 - 46.6 %   MCV 82 79 - 97 fL   MCH 24.1 (L) 26.6 - 33.0 pg   MCHC 29.6 (L) 31.5 - 35.7 g/dL   RDW 84.0 (H) 88.2 - 84.5 %   Platelets 165 150 - 450 x10E3/uL   Neutrophils 39 Not Estab. %   Lymphs 42 Not Estab. %   Monocytes 14 Not Estab. %   Eos 3 Not Estab. %   Basos 2 Not Estab. %   Neutrophils Absolute 1.5 1.4 - 7.0 x10E3/uL   Lymphocytes Absolute 1.6 0.7 - 3.1 x10E3/uL   Monocytes Absolute 0.5 0.1 - 0.9 x10E3/uL   EOS (ABSOLUTE) 0.1 0.0 - 0.4 x10E3/uL   Basophils Absolute 0.1 0.0 - 0.2 x10E3/uL   Immature Granulocytes 0 Not Estab. %   Immature Grans (Abs) 0.0 0.0 - 0.1 x10E3/uL  Hemoglobin A1c     Status: Abnormal   Collection Time: 07/15/24 11:36 AM  Result Value Ref Range   Hgb A1c MFr Bld 7.3 (H) 4.8 - 5.6 %    Comment:          Prediabetes: 5.7 - 6.4          Diabetes: >6.4          Glycemic control for adults with diabetes: <7.0    Est. average glucose Bld gHb Est-mCnc 163 mg/dL  [id number]     Status: Abnormal   Collection Time: 07/15/24 11:36 AM  Result Value Ref Range   Glucose 93 70 - 99 mg/dL   BUN 26 8 - 27 mg/dL   Creatinine, Ser 8.52 (H) 0.57 - 1.00 mg/dL   eGFR 38 (L) >40 fO/fpw/8.26   BUN/Creatinine Ratio 18 12 - 28   Sodium 142 134 - 144 mmol/L   Potassium 4.8 3.5 - 5.2 mmol/L   Chloride 101 96 - 106 mmol/L   CO2 27 20 - 29 mmol/L   Calcium  9.8 8.7 - 10.3 mg/dL   Total Protein 6.8 6.0 - 8.5 g/dL   Albumin 4.3 3.9 - 4.9 g/dL   Globulin, Total 2.5 1.5 - 4.5 g/dL   Bilirubin Total 0.2 0.0 - 1.2 mg/dL   Alkaline Phosphatase 130 (H) 44 - 121 IU/L   AST 10 0 - 40 IU/L   ALT 16 0 - 32 IU/L  POCT CBG (Fasting - Glucose)     Status: Abnormal   Collection Time: 07/21/24  9:14 AM  Result Value Ref Range   Glucose Fasting, POC 111 (A) 70 - 99 mg/dL  POC Hemoccult Bld/Stl (1-Cd Office Dx)     Status: Abnormal   Collection Time: 08/01/24  3:59 PM  Result Value Ref Range   Card #1 Date  07/28/24    Fecal Occult Blood, POC Positive (A) Negative   Card #2 Fecal Occult Blod, POC Positive    Card #3 Fecal Occult Blood, POC Negative   POCT CBG (Fasting - Glucose)     Status: Abnormal   Collection Time: 09/06/24 11:30 AM  Result Value Ref Range   Glucose Fasting, POC 115 (A) 70 - 99 mg/dL      Assessment & Plan:  Start prednisone burst. Shoulder xray ordered. Educated on ROM exercises to perform at home multiple times a day. Reinforced healthy diet and exercise as tolerated. Problem List Items Addressed This Visit     BMI 40.0-44.9, adult (HCC)   Hypertension associated with diabetes (HCC)   Combined hyperlipidemia associated with type 2 diabetes mellitus (HCC)   Controlled type 2 diabetes mellitus with complication, without long-term current use of insulin (HCC) - Primary   Relevant Orders   POCT CBG (Fasting - Glucose) (Completed)   Right shoulder pain   Relevant Medications   predniSONE (DELTASONE) 20 MG tablet   Other Relevant Orders   DG Shoulder Right    Return in about 10 days (around 09/16/2024).   Total time spent: 30 minutes  FERNAND FREDY RAMAN, MD  09/06/2024   This document may have been prepared by [facility] Voice Recognition software and as such may include unintentional dictation errors.

## 2024-09-08 DIAGNOSIS — Z961 Presence of intraocular lens: Secondary | ICD-10-CM | POA: Diagnosis not present

## 2024-09-08 DIAGNOSIS — H18231 Secondary corneal edema, right eye: Secondary | ICD-10-CM | POA: Diagnosis not present

## 2024-09-12 DIAGNOSIS — Z96651 Presence of right artificial knee joint: Secondary | ICD-10-CM | POA: Diagnosis not present

## 2024-09-12 DIAGNOSIS — Z96652 Presence of left artificial knee joint: Secondary | ICD-10-CM | POA: Diagnosis not present

## 2024-09-12 DIAGNOSIS — M25511 Pain in right shoulder: Secondary | ICD-10-CM | POA: Diagnosis not present

## 2024-09-12 NOTE — Progress Notes (Signed)
 Pt informed, she stated had a follow up with ortho today regarding this

## 2024-09-16 ENCOUNTER — Ambulatory Visit: Admitting: Internal Medicine

## 2024-09-29 ENCOUNTER — Ambulatory Visit: Admitting: Urology

## 2024-09-29 DIAGNOSIS — M25511 Pain in right shoulder: Secondary | ICD-10-CM | POA: Diagnosis not present

## 2024-10-03 DIAGNOSIS — Z96652 Presence of left artificial knee joint: Secondary | ICD-10-CM | POA: Diagnosis not present

## 2024-10-03 DIAGNOSIS — M25511 Pain in right shoulder: Secondary | ICD-10-CM | POA: Diagnosis not present

## 2024-10-03 DIAGNOSIS — M25562 Pain in left knee: Secondary | ICD-10-CM | POA: Diagnosis not present

## 2024-10-04 ENCOUNTER — Ambulatory Visit
Admission: RE | Admit: 2024-10-04 | Discharge: 2024-10-04 | Disposition: A | Discharge status: Home / Self Care | Source: Ambulatory Visit

## 2024-10-04 DIAGNOSIS — M4722 Other spondylosis with radiculopathy, cervical region: Secondary | ICD-10-CM | POA: Diagnosis not present

## 2024-10-04 DIAGNOSIS — M48061 Spinal stenosis, lumbar region without neurogenic claudication: Secondary | ICD-10-CM | POA: Diagnosis not present

## 2024-10-04 DIAGNOSIS — M47812 Spondylosis without myelopathy or radiculopathy, cervical region: Secondary | ICD-10-CM

## 2024-10-04 DIAGNOSIS — M5011 Cervical disc disorder with radiculopathy,  high cervical region: Secondary | ICD-10-CM | POA: Diagnosis not present

## 2024-10-06 DIAGNOSIS — H25042 Posterior subcapsular polar age-related cataract, left eye: Secondary | ICD-10-CM | POA: Diagnosis not present

## 2024-10-06 DIAGNOSIS — H5371 Glare sensitivity: Secondary | ICD-10-CM | POA: Diagnosis not present

## 2024-10-06 DIAGNOSIS — Z961 Presence of intraocular lens: Secondary | ICD-10-CM | POA: Diagnosis not present

## 2024-10-06 DIAGNOSIS — H25012 Cortical age-related cataract, left eye: Secondary | ICD-10-CM | POA: Diagnosis not present

## 2024-10-06 DIAGNOSIS — H2512 Age-related nuclear cataract, left eye: Secondary | ICD-10-CM | POA: Diagnosis not present

## 2024-10-10 DIAGNOSIS — L218 Other seborrheic dermatitis: Secondary | ICD-10-CM | POA: Diagnosis not present

## 2024-10-12 DIAGNOSIS — R159 Full incontinence of feces: Secondary | ICD-10-CM | POA: Diagnosis not present

## 2024-10-12 DIAGNOSIS — R195 Other fecal abnormalities: Secondary | ICD-10-CM | POA: Diagnosis not present

## 2024-10-13 DIAGNOSIS — Z96652 Presence of left artificial knee joint: Secondary | ICD-10-CM | POA: Diagnosis not present

## 2024-10-13 DIAGNOSIS — M25562 Pain in left knee: Secondary | ICD-10-CM | POA: Diagnosis not present

## 2024-10-13 DIAGNOSIS — M25511 Pain in right shoulder: Secondary | ICD-10-CM | POA: Diagnosis not present

## 2024-10-17 DIAGNOSIS — R42 Dizziness and giddiness: Secondary | ICD-10-CM | POA: Diagnosis not present

## 2024-10-17 DIAGNOSIS — R2689 Other abnormalities of gait and mobility: Secondary | ICD-10-CM | POA: Diagnosis not present

## 2024-10-17 DIAGNOSIS — G5600 Carpal tunnel syndrome, unspecified upper limb: Secondary | ICD-10-CM | POA: Diagnosis not present

## 2024-10-17 DIAGNOSIS — M47812 Spondylosis without myelopathy or radiculopathy, cervical region: Secondary | ICD-10-CM | POA: Diagnosis not present

## 2024-10-17 DIAGNOSIS — M25511 Pain in right shoulder: Secondary | ICD-10-CM | POA: Diagnosis not present

## 2024-10-17 DIAGNOSIS — Z96652 Presence of left artificial knee joint: Secondary | ICD-10-CM | POA: Diagnosis not present

## 2024-10-17 DIAGNOSIS — R4789 Other speech disturbances: Secondary | ICD-10-CM | POA: Diagnosis not present

## 2024-10-17 DIAGNOSIS — M25562 Pain in left knee: Secondary | ICD-10-CM | POA: Diagnosis not present

## 2024-10-18 ENCOUNTER — Inpatient Hospital Stay: Admission: RE | Admit: 2024-10-18 | Source: Ambulatory Visit

## 2024-10-18 ENCOUNTER — Encounter

## 2024-10-19 ENCOUNTER — Encounter: Payer: Self-pay | Admitting: *Deleted

## 2024-10-24 ENCOUNTER — Other Ambulatory Visit: Payer: Self-pay | Admitting: Internal Medicine

## 2024-10-26 ENCOUNTER — Ambulatory Visit
Admission: RE | Admit: 2024-10-26 | Discharge: 2024-10-26 | Disposition: A | Source: Ambulatory Visit | Attending: Oncology

## 2024-10-26 DIAGNOSIS — Z853 Personal history of malignant neoplasm of breast: Secondary | ICD-10-CM | POA: Diagnosis not present

## 2024-10-26 DIAGNOSIS — Z08 Encounter for follow-up examination after completed treatment for malignant neoplasm: Secondary | ICD-10-CM | POA: Diagnosis not present

## 2024-10-26 DIAGNOSIS — C50919 Malignant neoplasm of unspecified site of unspecified female breast: Secondary | ICD-10-CM

## 2024-10-26 DIAGNOSIS — R92323 Mammographic fibroglandular density, bilateral breasts: Secondary | ICD-10-CM | POA: Diagnosis not present

## 2024-10-26 DIAGNOSIS — R921 Mammographic calcification found on diagnostic imaging of breast: Secondary | ICD-10-CM | POA: Diagnosis not present

## 2024-10-26 DIAGNOSIS — N644 Mastodynia: Secondary | ICD-10-CM | POA: Diagnosis not present

## 2024-10-27 DIAGNOSIS — Z96652 Presence of left artificial knee joint: Secondary | ICD-10-CM | POA: Diagnosis not present

## 2024-10-27 DIAGNOSIS — M25562 Pain in left knee: Secondary | ICD-10-CM | POA: Diagnosis not present

## 2024-10-27 DIAGNOSIS — M25511 Pain in right shoulder: Secondary | ICD-10-CM | POA: Diagnosis not present

## 2024-10-31 DIAGNOSIS — M25511 Pain in right shoulder: Secondary | ICD-10-CM | POA: Diagnosis not present

## 2024-10-31 DIAGNOSIS — Z96652 Presence of left artificial knee joint: Secondary | ICD-10-CM | POA: Diagnosis not present

## 2024-10-31 DIAGNOSIS — Z96651 Presence of right artificial knee joint: Secondary | ICD-10-CM | POA: Diagnosis not present

## 2024-10-31 DIAGNOSIS — M25512 Pain in left shoulder: Secondary | ICD-10-CM | POA: Diagnosis not present

## 2024-11-01 DIAGNOSIS — M25562 Pain in left knee: Secondary | ICD-10-CM | POA: Diagnosis not present

## 2024-11-01 DIAGNOSIS — Z96652 Presence of left artificial knee joint: Secondary | ICD-10-CM | POA: Diagnosis not present

## 2024-11-01 DIAGNOSIS — M25511 Pain in right shoulder: Secondary | ICD-10-CM | POA: Diagnosis not present

## 2024-11-04 ENCOUNTER — Ambulatory Visit: Admitting: Anesthesiology

## 2024-11-04 ENCOUNTER — Ambulatory Visit
Admission: RE | Admit: 2024-11-04 | Discharge: 2024-11-04 | Disposition: A | Attending: Gastroenterology | Admitting: Gastroenterology

## 2024-11-04 ENCOUNTER — Other Ambulatory Visit: Payer: Self-pay

## 2024-11-04 ENCOUNTER — Encounter
Admission: RE | Disposition: A | Discharge status: Home / Self Care | Payer: Self-pay | Source: Home / Self Care | Attending: Gastroenterology

## 2024-11-04 DIAGNOSIS — Z90710 Acquired absence of both cervix and uterus: Secondary | ICD-10-CM | POA: Diagnosis not present

## 2024-11-04 DIAGNOSIS — I130 Hypertensive heart and chronic kidney disease with heart failure and stage 1 through stage 4 chronic kidney disease, or unspecified chronic kidney disease: Secondary | ICD-10-CM | POA: Diagnosis not present

## 2024-11-04 DIAGNOSIS — Z9049 Acquired absence of other specified parts of digestive tract: Secondary | ICD-10-CM | POA: Diagnosis not present

## 2024-11-04 DIAGNOSIS — Z7984 Long term (current) use of oral hypoglycemic drugs: Secondary | ICD-10-CM | POA: Diagnosis not present

## 2024-11-04 DIAGNOSIS — E66813 Obesity, class 3: Secondary | ICD-10-CM | POA: Diagnosis not present

## 2024-11-04 DIAGNOSIS — R159 Full incontinence of feces: Secondary | ICD-10-CM | POA: Diagnosis not present

## 2024-11-04 DIAGNOSIS — N183 Chronic kidney disease, stage 3 unspecified: Secondary | ICD-10-CM | POA: Diagnosis not present

## 2024-11-04 DIAGNOSIS — R195 Other fecal abnormalities: Secondary | ICD-10-CM | POA: Diagnosis present

## 2024-11-04 DIAGNOSIS — G4730 Sleep apnea, unspecified: Secondary | ICD-10-CM | POA: Diagnosis not present

## 2024-11-04 DIAGNOSIS — E039 Hypothyroidism, unspecified: Secondary | ICD-10-CM | POA: Diagnosis not present

## 2024-11-04 DIAGNOSIS — K635 Polyp of colon: Secondary | ICD-10-CM | POA: Diagnosis not present

## 2024-11-04 DIAGNOSIS — E1122 Type 2 diabetes mellitus with diabetic chronic kidney disease: Secondary | ICD-10-CM | POA: Diagnosis not present

## 2024-11-04 DIAGNOSIS — Z6838 Body mass index (BMI) 38.0-38.9, adult: Secondary | ICD-10-CM | POA: Diagnosis not present

## 2024-11-04 HISTORY — DX: Headache, unspecified: R51.9

## 2024-11-04 HISTORY — DX: Type 2 diabetes mellitus with diabetic polyneuropathy: E11.42

## 2024-11-04 HISTORY — DX: Iron deficiency anemia, unspecified: D50.9

## 2024-11-04 HISTORY — DX: Secondary hyperparathyroidism of renal origin: N25.81

## 2024-11-04 HISTORY — DX: Dyspnea, unspecified: R06.00

## 2024-11-04 HISTORY — PX: COLONOSCOPY: SHX5424

## 2024-11-04 HISTORY — PX: POLYPECTOMY: SHX149

## 2024-11-04 HISTORY — DX: Gastro-esophageal reflux disease without esophagitis: K21.9

## 2024-11-04 HISTORY — DX: Heart failure, unspecified: I50.9

## 2024-11-04 SURGERY — COLONOSCOPY
Anesthesia: General

## 2024-11-04 MED ORDER — SODIUM CHLORIDE 0.9 % IV SOLN: INTRAVENOUS | Status: DC

## 2024-11-04 MED ORDER — PROPOFOL 10 MG/ML IV BOLUS
INTRAVENOUS | Status: DC | PRN
  Administered 2024-11-04: 100 ug/kg/min via INTRAVENOUS
  Administered 2024-11-04: 20 mg via INTRAVENOUS
  Administered 2024-11-04: 80 mg via INTRAVENOUS

## 2024-11-04 MED ADMIN — Glycopyrrolate Inj 0.2 MG/ML: .1 mg | INTRAVENOUS | NDC 00143968201

## 2024-11-04 NOTE — Anesthesia Preprocedure Evaluation (Addendum)
 Anesthesia Evaluation  Patient identified by MRN, date of birth, ID band Patient awake    Reviewed: Allergy & Precautions, NPO status , Patient's Chart, lab work & pertinent test results  Airway Mallampati: III  TM Distance: >3 FB Neck ROM: full    Dental  (+) Teeth Intact   Pulmonary neg pulmonary ROS, asthma , sleep apnea and Continuous Positive Airway Pressure Ventilation    Pulmonary exam normal breath sounds clear to auscultation       Cardiovascular Exercise Tolerance: Good hypertension, Pt. on medications +CHF  Normal cardiovascular exam Rhythm:Regular Rate:Normal     Neuro/Psych  Headaches negative neurological ROS  negative psych ROS   GI/Hepatic negative GI ROS, Neg liver ROS,GERD  Medicated,,  Endo/Other  diabetes, Well Controlled, Type 2, Oral Hypoglycemic AgentsHypothyroidism  Class 3 obesity  Renal/GU CRFRenal disease  negative genitourinary   Musculoskeletal   Abdominal  (+) + obese  Peds negative pediatric ROS (+)  Hematology negative hematology ROS (+) Blood dyscrasia, anemia   Anesthesia Other Findings Past Medical History: No date: Adverse effect of anesthesia     Comment:  hard to awaken No date: Anemia No date: Arthritis No date: Asthma No date: Asthma without status asthmaticus     Comment:  unspecified; Take advair  each night 2021: Breast cancer (HCC)     Comment:  right breast IMC No date: Bronchitis No date: CHF (congestive heart failure) (HCC) No date: Chronic kidney disease     Comment:  stage III per dr marcelino No date: Constipation 2010: Diabetes (HCC) No date: Diabetes mellitus type 2, uncomplicated (HCC) No date: Diabetes mellitus without complication (HCC) No date: Diabetic polyneuropathy (HCC) No date: Dyspnea No date: Encounter for blood transfusion     Comment:  hysterectomy 9-10 years ago No date: GERD (gastroesophageal reflux disease) No date: Headache No date:  Heart murmur     Comment:  unspecified No date: Hyperlipidemia 1991: Hypertension No date: Hypothyroid No date: Iron deficiency anemia No date: Multiple thyroid  nodules No date: Neuromuscular disorder (HCC)     Comment:  neuropathy 02/04/2017: Osteoarthritis of right hip No date: Palpitations No date: Pernicious anemia No date: Pernicious anemia 2020-2021: Personal history of chemotherapy     Comment:  Right breast ca 2021: Personal history of radiation therapy     Comment:  right breast IMC No date: Pneumonia No date: Secondary hyperparathyroidism No date: Sleep apnea     Comment:  Use C-PAP; can't use mouth piece. Anxiety No date: Thyroid  disease     Comment:  nodules No date: Thyroid  nodule     Comment:  x2 No date: Urinary urgency  Past Surgical History: 2011: ABDOMINAL HYSTERECTOMY     Comment:  partial No date: ABDOMINAL SURGERY 10/02/2020: BREAST BIOPSY; Right     Comment:  affirm bx, x marker, fat necrosis 10/19/2019: BREAST BIOPSY; Right     Comment:  two masses at 9:00 us  bx, coil marker and venus marker,               IMC for both 03/28/2020: BREAST LUMPECTOMY; Right     Comment:  bracketing of two IMC masses at 9:00.  Venus marker was               pushed medially during wire loc. Remains in breast.                Clear surgical margins. Negative Lymph node 2008: CARPAL TUNNEL RELEASE; Left No date: CARPAL TUNNEL RELEASE; Right 1982: CHOLECYSTECTOMY  10/05/2017: COLONOSCOPY WITH PROPOFOL ; N/A     Comment:  Procedure: COLONOSCOPY WITH PROPOFOL ;  Surgeon: Viktoria Lamar DASEN, MD;  Location: [facility] ENDOSCOPY;  Service:               Endoscopy;  Laterality: N/A; 08/09/2021: COLONOSCOPY WITH PROPOFOL ; N/A     Comment:  Procedure: COLONOSCOPY WITH PROPOFOL ;  Surgeon: Onita Elspeth Sharper, DO;  Location: [facility] ENDOSCOPY;  Service:               Gastroenterology;  Laterality: N/A; No date: DIAGNOSTIC LAPAROSCOPY 08/09/2021:  ESOPHAGOGASTRODUODENOSCOPY (EGD) WITH PROPOFOL ; N/A     Comment:  Procedure: ESOPHAGOGASTRODUODENOSCOPY (EGD) WITH               PROPOFOL ;  Surgeon: Onita Elspeth Sharper, DO;  Location:              [facility] ENDOSCOPY;  Service: Gastroenterology;  Laterality:               N/A; 02/09/2017: JOINT REPLACEMENT; Right     Comment:  TOTAL HIP REPLACEMENT, DR. CARTER. [place]  1990s: KNEE ARTHROSCOPY No date: MASTECTOMY 03/28/2020: PARTIAL MASTECTOMY WITH NEEDLE LOCALIZATION AND AXILLARY  SENTINEL LYMPH NODE BX; Right     Comment:  Procedure: PARTIAL MASTECTOMY WITH NEEDLE LOCALIZATION               AND AXILLARY SENTINEL LYMPH NODE BX;  Surgeon:               Rodolph Romano, MD;  Location: [facility] ORS;  Service:              General;  Laterality: Right; 11/07/2019: PORTACATH PLACEMENT; Left     Comment:  Procedure: INSERTION PORT-A-CATH;  Surgeon:               Rodolph Romano, MD;  Location: [facility] ORS;  Service:              General;  Laterality: Left; 06/23/2017: TOTAL HIP ARTHROPLASTY; Left     Comment:  Procedure: TOTAL HIP ARTHROPLASTY ANTERIOR APPROACH;                Surgeon: Kathlynn Sharper, MD;  Location: [facility] ORS;                Service: Orthopedics;  Laterality: Left;  BMI    Body Mass Index: 38.23 kg/m      Reproductive/Obstetrics negative OB ROS                              Anesthesia Physical Anesthesia Plan  ASA: 3  Anesthesia Plan: General   Post-op Pain Management:    Induction: Intravenous  PONV Risk Score and Plan: Propofol  infusion and TIVA  Airway Management Planned: Natural Airway and Nasal Cannula  Additional Equipment:   Intra-op Plan:   Post-operative Plan:   Informed Consent: I have reviewed the patients History and Physical, chart, labs and discussed the procedure including the risks, benefits and alternatives for the proposed anesthesia with the patient or authorized representative who has indicated his/her  understanding and acceptance.     Dental Advisory Given  Plan Discussed with: CRNA  Anesthesia Plan Comments:          Anesthesia Quick Evaluation

## 2024-11-04 NOTE — Interval H&P Note (Signed)
 History and Physical Interval Note:  11/04/2024 1:31 PM  Christina Mcclain  has presented today for surgery, with the diagnosis of Heme + Stool.  The various methods of treatment have been discussed with the patient and family. After consideration of risks, benefits and other options for treatment, the patient has consented to  Procedures: COLONOSCOPY (N/A) as a surgical intervention.  The patient's history has been reviewed, patient examined, no change in status, stable for surgery.  I have reviewed the patient's chart and labs.  Questions were answered to the patient's satisfaction.     Christina Mcclain  Ok to proceed with colonoscopy

## 2024-11-04 NOTE — H&P (Signed)
 Outpatient short stay form Pre-procedure 11/04/2024  Ole ONEIDA Schick, MD  Primary Physician: Fernand Fredy RAMAN, MD  Reason for visit:  Heme+ stool, fecal incontinence  History of present illness:    68 y/o lady with history of obesity, hypertension, CKD, and DM 2 here for colonoscopy for heme positive stool and fecal incontinence. Last colonoscopy in 2022 was normal. No blood thinners. No family history of GI malignancies. History of hysterectomy and cholecystectomy.   Current Medications[1]  Medications Prior to Admission  Medication Sig Dispense Refill Last Dose/Taking   Ascorbic Acid  (VITAMIN C ) 1000 MG tablet Take 2 tablets by mouth daily.   Past Week   CALCIUM  CITRATE PO Take 1,000 mg by mouth daily.   Past Week   carvedilol  (COREG ) 25 MG tablet Take 1 tablet (25 mg total) by mouth 2 (two) times daily. 180 tablet 3 11/04/2024 at 10:00 AM   chlorthalidone (HYGROTON) 25 MG tablet Take 25 mg by mouth daily.   11/03/2024   cholecalciferol  (VITAMIN D ) 1000 units tablet Take 2,000 Units by mouth daily.   Past Week   ezetimibe  (ZETIA ) 10 MG tablet TAKE 1 TABLET(10 MG) BY MOUTH DAILY 90 tablet 3 11/03/2024   fluticasone -salmeterol (ADVAIR ) 250-50 MCG/ACT AEPB Inhale 1 puff into the lungs in the morning and at bedtime. 180 each 3 11/04/2024 at 10:00 AM   furosemide (LASIX) 20 MG tablet  (Patient taking differently: Take 20 mg by mouth as needed.)   Past Month   letrozole  (FEMARA ) 2.5 MG tablet TAKE 1 TABLET(2.5 MG) BY MOUTH DAILY 90 tablet 1 11/03/2024   losartan  (COZAAR ) 50 MG tablet Take 1 tablet (50 mg total) by mouth daily. 90 tablet 3 11/03/2024   magnesium  oxide (MAG-OX) 400 MG tablet Take 2 tablets by mouth daily.   Past Week   solifenacin  (VESICARE ) 10 MG tablet TAKE 1 TABLET(10 MG) BY MOUTH EVERY EVENING 90 tablet 1 11/03/2024   acetaminophen  (TYLENOL ) 500 MG tablet Take 500 mg by mouth every 6 (six) hours as needed.      albuterol  (VENTOLIN  HFA) 108 (90 Base) MCG/ACT inhaler  Inhale 2 puffs into the lungs every 6 (six) hours as needed for wheezing or shortness of breath. 18 g 2    aspirin  EC 81 MG tablet Take 1 tablet twice a day by oral route. (Patient not taking: Reported on 09/06/2024)      COLACE 100 MG capsule Take 1 capsule twice a day by oral route. (Patient not taking: Reported on 09/06/2024)      fluconazole (DIFLUCAN) 200 MG tablet Take 1 tablet by mouth once a week. (Patient not taking: Reported on 09/06/2024)      hydrALAZINE  (APRESOLINE ) 25 MG tablet Take 1 tablet (25 mg total) by mouth 2 times daily at 12 noon and 4 pm. 180 tablet 3 10:00 AM   loratadine  (CLARITIN ) 10 MG tablet Take 10 mg by mouth daily as needed for allergies.  (Patient not taking: Reported on 09/06/2024)      methocarbamol  (ROBAXIN ) 500 MG tablet Take 1 tablet every 8 hours by oral route. (Patient not taking: Reported on 09/06/2024)      pantoprazole  (PROTONIX ) 40 MG tablet Take 40 mg by mouth daily. (Patient not taking: Reported on 09/06/2024)      predniSONE  (DELTASONE ) 20 MG tablet Take 2 tablets (40 mg total) by mouth daily with breakfast. (Patient not taking: Reported on 11/04/2024) 10 tablet 0 Completed Course   pregabalin (LYRICA) 50 MG capsule TAKE 1 CAPSULE BY MOUTH TWICE  DAILY (Patient taking differently: Take 50 mg by mouth daily.) 180 capsule 3 11/02/2024   semaglutide -weight management (WEGOVY ) 0.25 MG/0.5ML SOAJ SQ injection Inject 0.25 mg into the skin once a week. (Patient not taking: Reported on 09/06/2024) 2 mL 0      Allergies[2]   Past Medical History:  Diagnosis Date   Adverse effect of anesthesia    hard to awaken   Anemia    Arthritis    Asthma    Asthma without status asthmaticus    unspecified; Take advair  each night   Breast cancer (HCC) 2021   right breast [facility]   Bronchitis    CHF (congestive heart failure) (HCC)    Chronic kidney disease    stage III per dr marcelino   Constipation    Diabetes (HCC) 2010   Diabetes mellitus type 2, uncomplicated  (HCC)    Diabetes mellitus without complication (HCC)    Diabetic polyneuropathy (HCC)    Dyspnea    Encounter for blood transfusion    hysterectomy 9-10 years ago   GERD (gastroesophageal reflux disease)    Headache    Heart murmur    unspecified   Hyperlipidemia    Hypertension 1991   Hypothyroid    Iron deficiency anemia    Multiple thyroid  nodules    Neuromuscular disorder (HCC)    neuropathy   Osteoarthritis of right hip 02/04/2017   Palpitations    Pernicious anemia    Pernicious anemia    Personal history of chemotherapy 2020-2021   Right breast ca   Personal history of radiation therapy 2021   right breast [facility]   Pneumonia    Secondary hyperparathyroidism    Sleep apnea    Use C-PAP; can't use mouth piece. Anxiety   Thyroid  disease    nodules   Thyroid  nodule    x2   Urinary urgency     Review of systems:  Otherwise negative.    Physical Exam  Gen: Alert, oriented. Appears stated age.  HEENT: PERRLA. Lungs: No respiratory distress CV: RRR Abd: soft, benign, no masses Ext: No edema    Planned procedures: Proceed with colonoscopy. The patient understands the nature of the planned procedure, indications, risks, alternatives and potential complications including but not limited to bleeding, infection, perforation, damage to internal organs and possible oversedation/side effects from anesthesia. The patient agrees and gives consent to proceed.  Please refer to procedure notes for findings, recommendations and patient disposition/instructions.     Ole ONEIDA Schick, MD [facility]         [1]  Current Facility-Administered Medications:    0.9 %  sodium chloride  infusion, , Intravenous, Continuous, Leta Bucklin, Ole ONEIDA, MD, Last Rate: 20 mL/hr at 11/04/24 1316, Continued from Pre-op at 11/04/24 1316 [2]  Allergies Allergen Reactions   Amlodipine Other (See Comments)    edema   Ketorolac  Other (See Comments)    UMKNOWN   Statins  Rash    Other reaction(s): Muscle Pain Possible hives also   Sulfamethoxazole Rash   Trimethoprim Rash

## 2024-11-04 NOTE — Transfer of Care (Signed)
 Immediate Anesthesia Transfer of Care Note  Patient: Christina Mcclain  Procedure(s) Performed: COLONOSCOPY POLYPECTOMY, INTESTINE  Patient Location: Endoscopy Unit  Anesthesia Type:General  Level of Consciousness: drowsy  Airway & Oxygen Therapy: Patient Spontanous Breathing  Post-op Assessment: Report given to RN and Post -op Vital signs reviewed and stable  Post vital signs: Reviewed and stable  Last Vitals:  Vitals Value Taken Time  BP    Temp    Pulse    Resp    SpO2      Last Pain:  Vitals:   11/04/24 1245  TempSrc: Temporal  PainSc: 0-No pain         Complications: No notable events documented.

## 2024-11-04 NOTE — Op Note (Signed)
 [facility] Patient Name: Christina Mcclain Procedure Date: 11/04/2024 1:23 PM MRN: [id number] Account #: [id number] Date of Birth: 11/19/1956 Admit Type: Outpatient Age: 68 Room: [facility] ENDO ROOM 3 Gender: Female Note Status: Finalized Instrument Name: Colon Scope [id number] Procedure:             Colonoscopy Indications:           Heme positive stool, Fecal incontinence Providers:             Ole Schick MD, MD Referring MD:          Fredy CANDIE Bathe, MD (Referring MD) Medicines:             Monitored Anesthesia Care Complications:         No immediate complications. Estimated blood loss:                         Minimal. Procedure:             Pre-Anesthesia Assessment:                        - Prior to the procedure, a History and Physical was                         performed, and patient medications and allergies were                         reviewed. The patient is competent. The risks and                         benefits of the procedure and the sedation options and                         risks were discussed with the patient. All questions                         were answered and informed consent was obtained.                         Patient identification and proposed procedure were                         verified by the physician, the nurse, the                         anesthesiologist, the anesthetist and the technician                         in the endoscopy suite. Mental Status Examination:                         alert and oriented. Airway Examination: normal                         oropharyngeal airway and neck mobility. Respiratory                         Examination: clear to auscultation. CV Examination:  normal. Prophylactic Antibiotics: The patient does not                         require prophylactic antibiotics. Prior                         Anticoagulants: The patient has taken no anticoagulant                          or antiplatelet agents. ASA Grade Assessment: III - A                         patient with severe systemic disease. After reviewing                         the risks and benefits, the patient was deemed in                         satisfactory condition to undergo the procedure. The                         anesthesia plan was to use monitored anesthesia care                         (MAC). Immediately prior to administration of                         medications, the patient was re-assessed for adequacy                         to receive sedatives. The heart rate, respiratory                         rate, oxygen saturations, blood pressure, adequacy of                         pulmonary ventilation, and response to care were                         monitored throughout the procedure. The physical                         status of the patient was re-assessed after the                         procedure.                        After obtaining informed consent, the colonoscope was                         passed under direct vision. Throughout the procedure,                         the patient's blood pressure, pulse, and oxygen                         saturations were monitored continuously. The  Colonoscope was introduced through the anus and                         advanced to the the terminal ileum, with                         identification of the appendiceal orifice and IC                         valve. The colonoscopy was performed without                         difficulty. The patient tolerated the procedure well.                         The quality of the bowel preparation was good. The                         terminal ileum, ileocecal valve, appendiceal orifice,                         and rectum were photographed. Findings:      The perianal and digital rectal examinations were normal.      The terminal ileum appeared normal.      A 2 mm  polyp was found in the proximal transverse colon. The polyp was       sessile. The polyp was removed with a cold snare. Resection and       retrieval were complete. Estimated blood loss was minimal.      The exam was otherwise without abnormality on direct and retroflexion       views. Impression:            - The examined portion of the ileum was normal.                        - One 2 mm polyp in the proximal transverse colon,                         removed with a cold snare. Resected and retrieved.                        - The examination was otherwise normal on direct and                         retroflexion views. Recommendation:        - Discharge patient to home.                        - Resume previous diet.                        - Continue present medications.                        - Await pathology results.                        - Repeat colonoscopy in 7-10 years for surveillance.                        -  Return to referring physician as previously                         scheduled. Procedure Code(s):     --- Professional ---                        [phone], Colonoscopy, flexible; with removal of                         tumor(s), polyp(s), or other lesion(s) by snare                         technique Diagnosis Code(s):     --- Professional ---                        D12.3, Benign neoplasm of transverse colon (hepatic                         flexure or splenic flexure)                        R19.5, Other fecal abnormalities                        R15.9, Full incontinence of feces CPT copyright 2022 [employer]. All rights reserved. The codes documented in this report are preliminary and upon coder review may  be revised to meet current compliance requirements. Ole Schick MD, MD 11/04/2024 1:59:17 PM Number of Addenda: 0 Note Initiated On: 11/04/2024 1:23 PM Scope Withdrawal Time: 0 hours 8 minutes 21 seconds  Total Procedure Duration: 0 hours 12  minutes 46 seconds  Estimated Blood Loss:  Estimated blood loss was minimal.      [facility]

## 2024-11-07 NOTE — Anesthesia Postprocedure Evaluation (Signed)
 Anesthesia Post Note  Patient: Christina Mcclain  Procedure(s) Performed: COLONOSCOPY POLYPECTOMY, INTESTINE  Patient location during evaluation: PACU Anesthesia Type: General Level of consciousness: awake Pain management: satisfactory to patient Vital Signs Assessment: post-procedure vital signs reviewed and stable Respiratory status: spontaneous breathing Cardiovascular status: stable Anesthetic complications: no   No notable events documented.   Last Vitals:  Vitals:   11/04/24 1418 11/04/24 1428  BP: (!) 154/76 (!) 160/87  Pulse: 64 63  Resp: 10 18  Temp:    SpO2: 100% 99%    Last Pain:  Vitals:   11/04/24 1418  TempSrc:   PainSc: 0-No pain                 VAN STAVEREN,Ethal Gotay

## 2024-11-08 LAB — SURGICAL PATHOLOGY

## 2024-12-01 ENCOUNTER — Telehealth: Payer: Self-pay

## 2024-12-01 NOTE — Telephone Encounter (Signed)
 Pt LM at 3:42p asking for new rx for lancet device and test strips to be called in to [facility]  please advise on new rx

## 2024-12-07 ENCOUNTER — Telehealth: Payer: Self-pay

## 2024-12-07 NOTE — Telephone Encounter (Signed)
 Needs a new glucose meter. Insurance said they would cover it but need a prescription sent in.

## 2024-12-12 ENCOUNTER — Other Ambulatory Visit: Payer: Self-pay

## 2024-12-12 DIAGNOSIS — M4302 Spondylolysis, cervical region: Secondary | ICD-10-CM

## 2024-12-12 DIAGNOSIS — R2689 Other abnormalities of gait and mobility: Secondary | ICD-10-CM

## 2024-12-12 DIAGNOSIS — R42 Dizziness and giddiness: Secondary | ICD-10-CM

## 2024-12-12 DIAGNOSIS — R4789 Other speech disturbances: Secondary | ICD-10-CM

## 2024-12-12 MED ORDER — ACCU-CHEK GUIDE TEST VI STRP: 1.0000 | ORAL_STRIP | Freq: Three times a day (TID) | 12 refills | Status: DC

## 2024-12-12 MED ORDER — ACCU-CHEK SOFTCLIX LANCETS MISC: 1.0000 | Freq: Three times a day (TID) | 12 refills | Status: AC

## 2024-12-12 MED ORDER — ACCU-CHEK GUIDE W/DEVICE KIT: 1.0000 | PACK | Freq: Every day | 0 refills | Status: DC

## 2024-12-13 ENCOUNTER — Other Ambulatory Visit: Payer: Self-pay

## 2024-12-13 MED ORDER — ACCU-CHEK GUIDE W/DEVICE KIT: 1.0000 | PACK | Freq: Every day | 0 refills | Status: AC

## 2024-12-13 MED ORDER — ACCU-CHEK GUIDE TEST VI STRP: 1.0000 | ORAL_STRIP | Freq: Three times a day (TID) | 12 refills | Status: DC

## 2024-12-14 ENCOUNTER — Other Ambulatory Visit: Payer: Self-pay | Admitting: Internal Medicine

## 2024-12-14 MED ORDER — FREESTYLE LITE TEST VI STRP: 1.0000 | ORAL_STRIP | Freq: Three times a day (TID) | 3 refills | Status: AC

## 2024-12-14 MED ORDER — ACCU-CHEK GUIDE TEST VI STRP: 1.0000 | ORAL_STRIP | Freq: Three times a day (TID) | 12 refills | Status: DC

## 2024-12-15 ENCOUNTER — Ambulatory Visit
Admission: RE | Admit: 2024-12-15 | Discharge: 2024-12-15 | Disposition: A | Discharge status: Home / Self Care | Source: Ambulatory Visit

## 2024-12-15 DIAGNOSIS — M4302 Spondylolysis, cervical region: Secondary | ICD-10-CM | POA: Insufficient documentation

## 2024-12-15 DIAGNOSIS — R4789 Other speech disturbances: Secondary | ICD-10-CM | POA: Insufficient documentation

## 2024-12-15 DIAGNOSIS — R42 Dizziness and giddiness: Secondary | ICD-10-CM | POA: Insufficient documentation

## 2024-12-15 DIAGNOSIS — R2689 Other abnormalities of gait and mobility: Secondary | ICD-10-CM | POA: Diagnosis present

## 2025-01-12 ENCOUNTER — Ambulatory Visit: Admitting: Internal Medicine

## 2025-01-25 ENCOUNTER — Ambulatory Visit: Admitting: Oncology
